# Patient Record
Sex: Male | Born: 1960 | Race: Black or African American | Hispanic: No | Smoking: Former smoker
Health system: Southern US, Community
[De-identification: ages and names within clinical notes are randomized; demographics above are authoritative.]

## PROBLEM LIST (undated history)

## (undated) DIAGNOSIS — Z8711 Personal history of peptic ulcer disease: Secondary | ICD-10-CM

## (undated) DIAGNOSIS — Z9289 Personal history of other medical treatment: Secondary | ICD-10-CM

## (undated) DIAGNOSIS — K621 Rectal polyp: Secondary | ICD-10-CM

## (undated) DIAGNOSIS — I2510 Atherosclerotic heart disease of native coronary artery without angina pectoris: Secondary | ICD-10-CM

## (undated) DIAGNOSIS — E785 Hyperlipidemia, unspecified: Secondary | ICD-10-CM

## (undated) DIAGNOSIS — I509 Heart failure, unspecified: Secondary | ICD-10-CM

## (undated) DIAGNOSIS — E119 Type 2 diabetes mellitus without complications: Secondary | ICD-10-CM

## (undated) DIAGNOSIS — K219 Gastro-esophageal reflux disease without esophagitis: Secondary | ICD-10-CM

## (undated) DIAGNOSIS — Z95810 Presence of automatic (implantable) cardiac defibrillator: Secondary | ICD-10-CM

## (undated) DIAGNOSIS — F419 Anxiety disorder, unspecified: Secondary | ICD-10-CM

## (undated) DIAGNOSIS — Z720 Tobacco use: Secondary | ICD-10-CM

## (undated) DIAGNOSIS — Z9989 Dependence on other enabling machines and devices: Secondary | ICD-10-CM

## (undated) DIAGNOSIS — F329 Major depressive disorder, single episode, unspecified: Secondary | ICD-10-CM

## (undated) DIAGNOSIS — G4733 Obstructive sleep apnea (adult) (pediatric): Secondary | ICD-10-CM

## (undated) DIAGNOSIS — J189 Pneumonia, unspecified organism: Secondary | ICD-10-CM

## (undated) DIAGNOSIS — K635 Polyp of colon: Secondary | ICD-10-CM

## (undated) HISTORY — DX: Tobacco use: Z72.0

## (undated) HISTORY — PX: CARDIAC CATHETERIZATION: SHX172

## (undated) HISTORY — DX: Rectal polyp: K62.1

## (undated) HISTORY — DX: Hyperlipidemia, unspecified: E78.5

## (undated) HISTORY — DX: Polyp of colon: K63.5

## (undated) HISTORY — DX: Personal history of other medical treatment: Z92.89

---
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
## 2000-11-04 DIAGNOSIS — K2270 Barrett's esophagus without dysplasia: Secondary | ICD-10-CM

## 2000-11-04 DIAGNOSIS — K635 Polyp of colon: Secondary | ICD-10-CM

## 2000-11-04 HISTORY — DX: Barrett's esophagus without dysplasia: K22.70

## 2000-11-04 HISTORY — DX: Polyp of colon: K63.5

## 2001-06-15 ENCOUNTER — Emergency Department (HOSPITAL_COMMUNITY): Admission: EM | Admit: 2001-06-15 | Discharge: 2001-06-15 | Payer: Self-pay | Admitting: Emergency Medicine

## 2001-06-15 ENCOUNTER — Encounter: Payer: Self-pay | Admitting: Emergency Medicine

## 2001-06-19 ENCOUNTER — Emergency Department (HOSPITAL_COMMUNITY): Admission: EM | Admit: 2001-06-19 | Discharge: 2001-06-19 | Payer: Self-pay | Admitting: Emergency Medicine

## 2001-09-23 ENCOUNTER — Encounter (INDEPENDENT_AMBULATORY_CARE_PROVIDER_SITE_OTHER): Payer: Self-pay | Admitting: *Deleted

## 2001-09-23 ENCOUNTER — Ambulatory Visit (HOSPITAL_COMMUNITY): Admission: RE | Admit: 2001-09-23 | Discharge: 2001-09-23 | Payer: Self-pay | Admitting: *Deleted

## 2003-11-05 HISTORY — PX: PATELLAR TENDON REPAIR: SHX737

## 2003-11-22 ENCOUNTER — Encounter (INDEPENDENT_AMBULATORY_CARE_PROVIDER_SITE_OTHER): Payer: Self-pay | Admitting: *Deleted

## 2003-11-22 ENCOUNTER — Ambulatory Visit (HOSPITAL_COMMUNITY): Admission: RE | Admit: 2003-11-22 | Discharge: 2003-11-22 | Payer: Self-pay | Admitting: *Deleted

## 2005-07-25 ENCOUNTER — Ambulatory Visit: Payer: Self-pay | Admitting: Cardiology

## 2005-08-12 ENCOUNTER — Ambulatory Visit: Payer: Self-pay

## 2005-08-19 ENCOUNTER — Ambulatory Visit: Payer: Self-pay | Admitting: Cardiology

## 2005-08-20 ENCOUNTER — Encounter (INDEPENDENT_AMBULATORY_CARE_PROVIDER_SITE_OTHER): Payer: Self-pay | Admitting: *Deleted

## 2005-08-20 ENCOUNTER — Ambulatory Visit: Payer: Self-pay | Admitting: Internal Medicine

## 2005-08-20 ENCOUNTER — Inpatient Hospital Stay (HOSPITAL_BASED_OUTPATIENT_CLINIC_OR_DEPARTMENT_OTHER): Admission: RE | Admit: 2005-08-20 | Discharge: 2005-08-20 | Payer: Self-pay | Admitting: Internal Medicine

## 2005-08-20 ENCOUNTER — Inpatient Hospital Stay (HOSPITAL_COMMUNITY)
Admission: AD | Admit: 2005-08-20 | Discharge: 2005-08-25 | Payer: Self-pay | Admitting: Thoracic Surgery (Cardiothoracic Vascular Surgery)

## 2005-09-09 ENCOUNTER — Encounter: Admission: RE | Admit: 2005-09-09 | Discharge: 2005-09-09 | Payer: Self-pay | Admitting: Unknown Physician Specialty

## 2005-09-17 ENCOUNTER — Ambulatory Visit: Payer: Self-pay | Admitting: Internal Medicine

## 2005-10-01 ENCOUNTER — Ambulatory Visit: Payer: Self-pay | Admitting: Internal Medicine

## 2005-10-21 ENCOUNTER — Ambulatory Visit: Payer: Self-pay | Admitting: Cardiology

## 2005-11-04 HISTORY — PX: CORONARY ARTERY BYPASS GRAFT: SHX141

## 2005-11-07 ENCOUNTER — Ambulatory Visit: Payer: Self-pay | Admitting: Internal Medicine

## 2005-11-07 ENCOUNTER — Ambulatory Visit: Payer: Self-pay | Admitting: Cardiology

## 2005-12-12 ENCOUNTER — Ambulatory Visit: Payer: Self-pay | Admitting: Internal Medicine

## 2005-12-20 ENCOUNTER — Ambulatory Visit: Payer: Self-pay

## 2005-12-20 ENCOUNTER — Encounter: Payer: Self-pay | Admitting: Internal Medicine

## 2005-12-26 ENCOUNTER — Encounter (HOSPITAL_COMMUNITY): Admission: RE | Admit: 2005-12-26 | Discharge: 2006-03-26 | Payer: Self-pay | Admitting: Cardiology

## 2005-12-30 ENCOUNTER — Ambulatory Visit: Payer: Self-pay | Admitting: Cardiology

## 2006-01-07 ENCOUNTER — Ambulatory Visit: Payer: Self-pay | Admitting: Cardiology

## 2006-01-27 ENCOUNTER — Ambulatory Visit: Payer: Self-pay | Admitting: Internal Medicine

## 2006-02-04 ENCOUNTER — Ambulatory Visit (HOSPITAL_COMMUNITY): Admission: RE | Admit: 2006-02-04 | Discharge: 2006-02-04 | Payer: Self-pay | Admitting: Internal Medicine

## 2006-02-04 ENCOUNTER — Ambulatory Visit: Payer: Self-pay | Admitting: Internal Medicine

## 2006-02-20 ENCOUNTER — Ambulatory Visit: Payer: Self-pay | Admitting: Cardiology

## 2006-02-21 ENCOUNTER — Ambulatory Visit: Payer: Self-pay

## 2006-03-25 ENCOUNTER — Ambulatory Visit: Payer: Self-pay | Admitting: Cardiology

## 2006-05-12 ENCOUNTER — Ambulatory Visit: Payer: Self-pay | Admitting: Cardiology

## 2006-07-08 ENCOUNTER — Ambulatory Visit: Payer: Self-pay | Admitting: Internal Medicine

## 2006-07-22 ENCOUNTER — Ambulatory Visit: Payer: Self-pay | Admitting: Internal Medicine

## 2006-08-19 ENCOUNTER — Ambulatory Visit: Payer: Self-pay

## 2006-08-19 ENCOUNTER — Encounter: Payer: Self-pay | Admitting: Cardiology

## 2006-09-09 ENCOUNTER — Ambulatory Visit: Payer: Self-pay | Admitting: Internal Medicine

## 2006-10-09 ENCOUNTER — Ambulatory Visit: Payer: Self-pay | Admitting: Cardiovascular Disease

## 2006-11-07 ENCOUNTER — Ambulatory Visit: Payer: Self-pay

## 2006-11-07 ENCOUNTER — Ambulatory Visit: Payer: Self-pay | Admitting: Cardiology

## 2006-11-10 ENCOUNTER — Ambulatory Visit: Payer: Self-pay | Admitting: Cardiology

## 2006-11-19 ENCOUNTER — Ambulatory Visit: Payer: Self-pay | Admitting: Cardiology

## 2006-11-19 LAB — CONVERTED CEMR LAB
BUN: 19 mg/dL (ref 6–23)
CO2: 30 meq/L (ref 19–32)
Calcium: 9.6 mg/dL (ref 8.4–10.5)
Chloride: 98 meq/L (ref 96–112)
Creatinine, Ser: 1.6 mg/dL — ABNORMAL HIGH (ref 0.4–1.5)
GFR calc Af Amer: 61 mL/min
GFR calc non Af Amer: 50 mL/min
Glucose, Bld: 128 mg/dL — ABNORMAL HIGH (ref 70–99)
HCT: 42.7 % (ref 39.0–52.0)
Hemoglobin: 13.4 g/dL (ref 13.0–17.0)
INR: 1 (ref 0.9–2.0)
MCHC: 31.4 g/dL (ref 30.0–36.0)
MCV: 85.1 fL (ref 78.0–100.0)
Platelets: 182 10*3/uL (ref 150–400)
Potassium: 3.6 meq/L (ref 3.5–5.1)
Prothrombin Time: 12.6 s (ref 10.0–14.0)
RBC: 5.01 M/uL (ref 4.22–5.81)
RDW: 13.8 % (ref 11.5–14.6)
Sodium: 137 meq/L (ref 135–145)
WBC: 5.3 10*3/uL (ref 4.5–10.5)
aPTT: 34.3 s (ref 26.5–36.5)

## 2006-11-21 ENCOUNTER — Ambulatory Visit: Payer: Self-pay | Admitting: Cardiology

## 2006-11-21 ENCOUNTER — Inpatient Hospital Stay (HOSPITAL_BASED_OUTPATIENT_CLINIC_OR_DEPARTMENT_OTHER): Admission: RE | Admit: 2006-11-21 | Discharge: 2006-11-21 | Payer: Self-pay | Admitting: Cardiology

## 2006-12-02 ENCOUNTER — Ambulatory Visit: Payer: Self-pay | Admitting: Cardiology

## 2007-01-20 ENCOUNTER — Ambulatory Visit: Payer: Self-pay | Admitting: Cardiology

## 2007-02-16 ENCOUNTER — Emergency Department (HOSPITAL_COMMUNITY): Admission: EM | Admit: 2007-02-16 | Discharge: 2007-02-16 | Payer: Self-pay | Admitting: Family Medicine

## 2007-07-27 ENCOUNTER — Ambulatory Visit: Payer: Self-pay | Admitting: Cardiology

## 2007-07-27 LAB — CONVERTED CEMR LAB
BUN: 13 mg/dL (ref 6–23)
CO2: 30 meq/L (ref 19–32)
Calcium: 10 mg/dL (ref 8.4–10.5)
Chloride: 100 meq/L (ref 96–112)
Creatinine, Ser: 1.5 mg/dL (ref 0.4–1.5)
GFR calc Af Amer: 65 mL/min
GFR calc non Af Amer: 54 mL/min
Glucose, Bld: 99 mg/dL (ref 70–99)
Potassium: 3.6 meq/L (ref 3.5–5.1)
Sodium: 140 meq/L (ref 135–145)

## 2008-01-07 ENCOUNTER — Emergency Department (HOSPITAL_COMMUNITY): Admission: EM | Admit: 2008-01-07 | Discharge: 2008-01-08 | Payer: Self-pay | Admitting: Emergency Medicine

## 2008-04-10 ENCOUNTER — Ambulatory Visit: Payer: Self-pay | Admitting: Internal Medicine

## 2008-04-10 ENCOUNTER — Observation Stay (HOSPITAL_COMMUNITY): Admission: EM | Admit: 2008-04-10 | Discharge: 2008-04-11 | Payer: Self-pay | Admitting: Emergency Medicine

## 2008-04-28 ENCOUNTER — Ambulatory Visit: Payer: Self-pay | Admitting: Cardiology

## 2008-10-06 ENCOUNTER — Ambulatory Visit: Payer: Self-pay | Admitting: Cardiology

## 2008-12-28 ENCOUNTER — Ambulatory Visit (HOSPITAL_BASED_OUTPATIENT_CLINIC_OR_DEPARTMENT_OTHER): Admission: RE | Admit: 2008-12-28 | Discharge: 2008-12-28 | Payer: Self-pay | Admitting: Cardiology

## 2008-12-31 ENCOUNTER — Ambulatory Visit: Payer: Self-pay | Admitting: Internal Medicine

## 2009-01-10 ENCOUNTER — Ambulatory Visit: Payer: Self-pay | Admitting: Pulmonary Disease

## 2009-04-06 DIAGNOSIS — E785 Hyperlipidemia, unspecified: Secondary | ICD-10-CM | POA: Insufficient documentation

## 2009-04-06 DIAGNOSIS — I428 Other cardiomyopathies: Secondary | ICD-10-CM

## 2009-04-06 DIAGNOSIS — I420 Dilated cardiomyopathy: Secondary | ICD-10-CM | POA: Insufficient documentation

## 2009-04-06 DIAGNOSIS — F17200 Nicotine dependence, unspecified, uncomplicated: Secondary | ICD-10-CM | POA: Insufficient documentation

## 2009-04-07 ENCOUNTER — Ambulatory Visit: Payer: Self-pay | Admitting: Cardiology

## 2009-04-20 ENCOUNTER — Encounter: Payer: Self-pay | Admitting: Cardiology

## 2009-04-20 ENCOUNTER — Ambulatory Visit: Payer: Self-pay

## 2009-05-09 ENCOUNTER — Emergency Department (HOSPITAL_COMMUNITY): Admission: EM | Admit: 2009-05-09 | Discharge: 2009-05-09 | Payer: Self-pay | Admitting: Emergency Medicine

## 2009-12-19 ENCOUNTER — Telehealth: Payer: Self-pay | Admitting: Cardiology

## 2009-12-20 ENCOUNTER — Emergency Department (HOSPITAL_COMMUNITY): Admission: EM | Admit: 2009-12-20 | Discharge: 2009-12-20 | Payer: Self-pay | Admitting: Emergency Medicine

## 2009-12-20 ENCOUNTER — Encounter (INDEPENDENT_AMBULATORY_CARE_PROVIDER_SITE_OTHER): Payer: Self-pay | Admitting: *Deleted

## 2009-12-25 ENCOUNTER — Telehealth: Payer: Self-pay | Admitting: Cardiology

## 2010-02-08 ENCOUNTER — Encounter (INDEPENDENT_AMBULATORY_CARE_PROVIDER_SITE_OTHER): Payer: Self-pay | Admitting: *Deleted

## 2010-02-08 ENCOUNTER — Ambulatory Visit: Payer: Self-pay | Admitting: Gastroenterology

## 2010-02-08 DIAGNOSIS — K625 Hemorrhage of anus and rectum: Secondary | ICD-10-CM | POA: Insufficient documentation

## 2010-02-08 DIAGNOSIS — K219 Gastro-esophageal reflux disease without esophagitis: Secondary | ICD-10-CM | POA: Insufficient documentation

## 2010-02-08 DIAGNOSIS — K649 Unspecified hemorrhoids: Secondary | ICD-10-CM | POA: Insufficient documentation

## 2010-02-08 LAB — CONVERTED CEMR LAB
ALT: 33 units/L (ref 0–53)
AST: 34 units/L (ref 0–37)
Albumin: 4.1 g/dL (ref 3.5–5.2)
Alkaline Phosphatase: 41 units/L (ref 39–117)
BUN: 14 mg/dL (ref 6–23)
Basophils Absolute: 0 10*3/uL (ref 0.0–0.1)
Basophils Relative: 0.3 % (ref 0.0–3.0)
Bilirubin, Direct: 0.1 mg/dL (ref 0.0–0.3)
CO2: 32 meq/L (ref 19–32)
Calcium: 9.4 mg/dL (ref 8.4–10.5)
Chloride: 102 meq/L (ref 96–112)
Creatinine, Ser: 1.4 mg/dL (ref 0.4–1.5)
Eosinophils Absolute: 0.1 10*3/uL (ref 0.0–0.7)
Eosinophils Relative: 1.1 % (ref 0.0–5.0)
Ferritin: 41.7 ng/mL (ref 22.0–322.0)
Folate: 8.7 ng/mL
GFR calc non Af Amer: 69.83 mL/min (ref >60)
Glucose, Bld: 94 mg/dL (ref 70–99)
HCT: 43 % (ref 39.0–52.0)
Hemoglobin: 14.4 g/dL (ref 13.0–17.0)
Iron: 82 ug/dL (ref 42–165)
Lymphocytes Relative: 51.8 % — ABNORMAL HIGH (ref 12.0–46.0)
Lymphs Abs: 2.9 10*3/uL (ref 0.7–4.0)
MCHC: 33.5 g/dL (ref 30.0–36.0)
MCV: 87.7 fL (ref 78.0–100.0)
Monocytes Absolute: 0.5 10*3/uL (ref 0.1–1.0)
Monocytes Relative: 9.6 % (ref 3.0–12.0)
Neutro Abs: 2.1 10*3/uL (ref 1.4–7.7)
Neutrophils Relative %: 37.2 % — ABNORMAL LOW (ref 43.0–77.0)
Platelets: 154 10*3/uL (ref 150.0–400.0)
Potassium: 4.2 meq/L (ref 3.5–5.1)
RBC: 4.9 M/uL (ref 4.22–5.81)
RDW: 14.6 % (ref 11.5–14.6)
Saturation Ratios: 20.1 % (ref 20.0–50.0)
Sodium: 140 meq/L (ref 135–145)
TSH: 0.79 microintl units/mL (ref 0.35–5.50)
Total Bilirubin: 0.7 mg/dL (ref 0.3–1.2)
Total Protein: 7 g/dL (ref 6.0–8.3)
Transferrin: 291.2 mg/dL (ref 212.0–360.0)
Vitamin B-12: 605 pg/mL (ref 211–911)
WBC: 5.7 10*3/uL (ref 4.5–10.5)

## 2010-03-08 ENCOUNTER — Ambulatory Visit: Payer: Self-pay | Admitting: Gastroenterology

## 2010-03-08 ENCOUNTER — Ambulatory Visit (HOSPITAL_COMMUNITY): Admission: RE | Admit: 2010-03-08 | Discharge: 2010-03-08 | Payer: Self-pay | Admitting: Gastroenterology

## 2010-03-13 ENCOUNTER — Encounter: Payer: Self-pay | Admitting: Gastroenterology

## 2010-04-03 ENCOUNTER — Encounter: Payer: Self-pay | Admitting: Cardiology

## 2010-05-02 ENCOUNTER — Ambulatory Visit: Payer: Self-pay | Admitting: Cardiology

## 2010-05-02 DIAGNOSIS — E669 Obesity, unspecified: Secondary | ICD-10-CM | POA: Insufficient documentation

## 2010-05-17 ENCOUNTER — Ambulatory Visit: Payer: Self-pay | Admitting: Cardiology

## 2010-05-23 ENCOUNTER — Encounter (INDEPENDENT_AMBULATORY_CARE_PROVIDER_SITE_OTHER): Payer: Self-pay | Admitting: *Deleted

## 2010-05-23 LAB — CONVERTED CEMR LAB
ALT: 28 units/L (ref 0–53)
AST: 34 units/L (ref 0–37)
Albumin: 4.1 g/dL (ref 3.5–5.2)
Alkaline Phosphatase: 41 units/L (ref 39–117)
Basophils Absolute: 0 10*3/uL (ref 0.0–0.1)
Basophils Relative: 0.5 % (ref 0.0–3.0)
Bilirubin, Direct: 0.1 mg/dL (ref 0.0–0.3)
Cholesterol: 234 mg/dL — ABNORMAL HIGH (ref 0–200)
Direct LDL: 166.4 mg/dL
Eosinophils Absolute: 0.1 10*3/uL (ref 0.0–0.7)
Eosinophils Relative: 2 % (ref 0.0–5.0)
HCT: 43.8 % (ref 39.0–52.0)
HDL: 50 mg/dL (ref >39.00)
Hemoglobin: 14.8 g/dL (ref 13.0–17.0)
Lymphocytes Relative: 52 % — ABNORMAL HIGH (ref 12.0–46.0)
Lymphs Abs: 2.8 10*3/uL (ref 0.7–4.0)
MCHC: 33.7 g/dL (ref 30.0–36.0)
MCV: 87.1 fL (ref 78.0–100.0)
Monocytes Absolute: 0.5 10*3/uL (ref 0.1–1.0)
Monocytes Relative: 9.1 % (ref 3.0–12.0)
Neutro Abs: 1.9 10*3/uL (ref 1.4–7.7)
Neutrophils Relative %: 36.4 % — ABNORMAL LOW (ref 43.0–77.0)
Platelets: 142 10*3/uL — ABNORMAL LOW (ref 150.0–400.0)
RBC: 5.03 M/uL (ref 4.22–5.81)
RDW: 15.4 % — ABNORMAL HIGH (ref 11.5–14.6)
Total Bilirubin: 0.8 mg/dL (ref 0.3–1.2)
Total CHOL/HDL Ratio: 5
Total Protein: 7 g/dL (ref 6.0–8.3)
Triglycerides: 102 mg/dL (ref 0.0–149.0)
VLDL: 20.4 mg/dL (ref 0.0–40.0)
WBC: 5.3 10*3/uL (ref 4.5–10.5)

## 2010-10-09 ENCOUNTER — Telehealth: Payer: Self-pay | Admitting: Cardiology

## 2010-12-04 NOTE — Procedures (Signed)
Summary: EGD   EGD  Procedure date:  11/22/2003  Findings:      Location: [facility]   Patient Name: Bruce Robertson, Bruce Robertson MRN: [id number] Procedure Procedures: Panendoscopy (EGD) CPT: 43235.  Personnel: Endoscopist: Roosvelt Harps, MD.  Exam Location: Exam performed in Endoscopy Suite. Outpatient  Patient Consent: Procedure, Alternatives, Risks and Benefits discussed, consent obtained, from patient. Consent was obtained by the RN. Consent to be contacted was not given.  Indications Symptoms: Reflux symptoms  Surveillance of: Barrett's Esophagus.  History  Current Medications: Patient is not currently taking Coumadin.  Allergies: No known allergies.  Pre-Exam Physical: Cardio-pulmonary exam, HEENT exam, Abdominal exam, Extremity exam, Neurological exam, Mental status exam WNL.  Exam Exam Info: Maximum depth of insertion Stomach, intended Duodenum. Incomplete due to patient intolerance. Patient position: on left side. Vocal cords visualized. Gastric retroflexion performed. Images taken. ASA Classification: II. Tolerance: poor.  Sedation Meds: Patient assessed and found to be appropriate for moderate (conscious) sedation. Sedation was managed by the Endoscopist. Cetacaine Spray 2 sprays given aerosolized. Demerol 100 mg. given IV. Versed 10 mg. given IV.  Monitoring: BP and pulse monitoring done. Oximetry used. Supplemental O2 given  Fluoroscopy: Fluoroscopy was not used.  Findings - HIATAL HERNIA: ICD9: Hernia, Hiatal: 553.3.Comments: Images 1-3:  evidence of nonprogressive Barrett's.   Assessment Abnormal examination, see findings above.  Diagnoses: 553.3: Hernia, Hiatal.  530.2: Barrett's.   Comments: Patient was so combative that no biopsies could be done.  Although he needs rep[eat EGD in 2-3 yrs, THIS WILL NOT BE DONE WITHOUT GENERAL ANESTESIA OR PROPOFOL. Events  Unplanned Intervention: No unplanned interventions were required.    Unplanned Events: There were no complications. Plans Medication(s): Continue current medications.  Patient Education: Patient given standard instructions for: Barrett's. Hiatal Hernia.  Disposition: After procedure patient sent to recovery. After recovery patient sent home.  Scheduling: Follow-up prn. EGD, to Roosvelt Harps, MD, around Nov 21, 2004.    This report was created from the original endoscopy report, which was reviewed and signed by the above listed endoscopist.

## 2010-12-04 NOTE — Procedures (Signed)
Summary: Colonoscopy  Patient: Bruce Robertson Note: All result statuses are Final unless otherwise noted.  Tests: (1) Colonoscopy (COL)   COL Colonoscopy           DONE     [facility]     [address] [place], [place]  [address]           COLONOSCOPY PROCEDURE REPORT           PATIENT:  Bruce Robertson, Bruce Robertson  MR#:  [id number]     BIRTHDATE:  Nov 11, 1960, 49 yrs. old  GENDER:  male     ENDOSCOPIST:  Rachael Fee, MD     REF. BY:  Vania Rea. Jarold Motto, M.D.     PROCEDURE DATE:  03/08/2010     PROCEDURE:  Diagnostic Colonoscopy     ASA CLASS:  Class III     INDICATIONS:  minor rectal bleeding; had colonoscopy 2002 by Dr.     Luther Parody, single HP polyp removed     MEDICATIONS:   MAC sedation, administered by CRNA           DESCRIPTION OF PROCEDURE:   After the risks benefits and     alternatives of the procedure were thoroughly explained, informed     consent was obtained.  Digital rectal exam was performed and     revealed no rectal masses.   The  endoscope was introduced through     the anus and advanced to the cecum, which was identified by both     the appendix and ileocecal valve, without limitations.  The     quality of the prep was good, using MoviPrep.  The instrument was     then slowly withdrawn as the colon was fully examined.     <<PROCEDUREIMAGES>>           FINDINGS:  Internal and external hemorrhoids were found. These     were small and not thrombosed.  other finding (see image1, image2,     and image3).   Retroflexed views in the rectum revealed no     abnormalities.    The scope was then withdrawn from the patient     and the procedure completed.           COMPLICATIONS:  None           ENDOSCOPIC IMPRESSION:     1) Small internal and external hemorrhoids     2) No polyps or cancers           RECOMMENDATIONS:     1) Continue current colorectal screening recommendations for     "routine risk" patients with a repeat colonoscopy in 10 years.    2) Follow up as needed with Dr. Jarold Motto.           REPEAT EXAM:  10 years           ______________________________     Rachael Fee, MD           n.     eSIGNED:   Rachael Fee at 03/08/2010 02:18 PM           Felicity Pellegrini, [id number]  Note: An exclamation mark (!) indicates a result that was not dispersed into the flowsheet. Document Creation Date: 03/08/2010 2:18 PM _______________________________________________________________________  (1) Order result status: Final Collection or observation date-time: 03/08/2010 14:04 Requested date-time:  Receipt date-time:  Reported date-time:  Referring Physician:   Ordering Physician: Rob Bunting [id number]) Specimen Source:  Source: Launa Grill Order Number: [id number] Lab site:

## 2010-12-04 NOTE — Procedures (Signed)
Summary: Colon   Colonoscopy  Procedure date:  09/23/2001  Findings:      Location:  [facility].   Patient Name: Bruce Robertson, Bruce Robertson MRN: [id number] Procedure Procedures: Colonoscopy CPT: 11914.    with Hot Biopsy(s)CPT: Z451292.  Personnel: Endoscopist: Roosvelt Harps, MD.  Referred By: Sherin Quarry, MD.  Exam Location: Exam performed in Endoscopy Suite. Outpatient  Patient Consent: Procedure, Alternatives, Risks and Benefits discussed, consent obtained, from patient. Consent to be contacted was not given.  Indications Symptoms: Hematochezia.  History Allergies: No known allergies.  Patient Habits Patient does not smoke. Drinking Status: not currently drinking.  Pre-Exam Physical: Cardio-pulmonary exam, Rectal exam, HEENT exam , Abdominal exam, Extremity exam, Neurological exam, Mental status exam WNL.  Exam Exam: Extent of exam reached: Cecum, extent intended: Cecum.  The cecum was identified by appendiceal orifice and IC valve. Patient position: on left side. Colon retroflexion performed. Images taken. ASA Classification: I. Tolerance: excellent.  Monitoring: Pulse and BP monitoring, Oximetry used. Supplemental O2 given.  Colon Prep Used Phospho Soda for colon prep. Prep results: excellent.  Fluoroscopy: Fluoroscopy was not used.  Sedation Meds: Sedation was managed by the Endoscopist. Demerol 30 mg. Versed 3 mg.  Findings , IMAGE TAKEN IMAGE TAKEN: Ascending Colon.  Image #2 attached.  Comments:  Normal.  IMAGE TAKEN: Cecum.  Image #1 attached.  Comments:  Normal.  POLYP: Sigmoid Colon, diminutive, sessile polyp. Distance from Anus 25 cm. Procedure:  hot biopsy, removed, retrieved, Polyp sent to pathology. ICD9: Colon Polyps: 211.3.  HEMORRHOIDS: Internal. Size: Grade II. Not bleeding. Not thrombosed. ICD9: Hemorrhoids, Internal: 455.0. Comments: Images 3 & 4.   Assessment Abnormal examination, see findings above.  Diagnoses: 455.0: Hemorrhoids,  Internal.  211.3: Colon Polyps.   Events  Unplanned Interventions: No intervention was required.  Unplanned Events: There were no complications. Plans  Post Exam Instructions: Post sedation instructions given.  Medication Plan: Continue current medications.  Patient Education: Patient given standard instructions for: Polyps.  Disposition: After procedure patient sent to recovery. After recovery patient sent home.  Scheduling/Referral: Follow-Up prn. Await pathology to schedule patient.    cc; Sherin Quarry, MD    This report was created from the original endoscopy report, which was reviewed and signed by the above listed endoscopist.

## 2010-12-04 NOTE — Progress Notes (Signed)
Summary: refill  Phone Note Refill Request Message from:  Patient on December 25, 2009 1:05 PM  Refills Requested: Medication #1:  COREG 12.5 MG TABS Take 1 tablet by mouth twice a day  Medication #2:  VIAGRA 100 MG TABS Take 1 tablet by mouth as directed Sentd to [facility] [address] [phone] pt have question about his Viagra  Initial call taken by: Judie Grieve,  December 25, 2009 1:07 PM  Follow-up for Phone Call        Spoke with pt, rx sent into pharmacy. Marrion Coy, CNA  December 25, 2009 2:00 PM  Follow-up by: Marrion Coy, CNA,  December 25, 2009 2:00 PM    New/Updated Medications: COREG 12.5 MG TABS (CARVEDILOL) Take 1 tablet by mouth twice a day FUROSEMIDE 40 MG TABS (FUROSEMIDE) Take 1 tablet by mouth once a day Prescriptions: FUROSEMIDE 40 MG TABS (FUROSEMIDE) Take 1 tablet by mouth once a day  #30 x 5   Entered by:   Marrion Coy, CNA   Authorized by:   Rollene Rotunda, MD, [facility]   Signed by:   Marrion Coy, CNA on 12/25/2009   Method used:   Electronically to        [facility] (retail)       [address]       [place], [place]  [address]       Ph: [phone]       Fax: [phone]   RxID:   [id number] COREG 12.5 MG TABS (CARVEDILOL) Take 1 tablet by mouth twice a day  #60 x 5   Entered by:   Marrion Coy, CNA   Authorized by:   Rollene Rotunda, MD, [facility]   Signed by:   Marrion Coy, CNA on 12/25/2009   Method used:   Electronically to        [facility] (retail)       [address]       [place], [place]  [address]       Ph: [phone]       Fax: [phone]   RxID:   [id number]

## 2010-12-04 NOTE — Progress Notes (Signed)
Summary: CHEST PAINS AND BLOOD IN STOOL  Phone Note Call from Patient Call back at Home Phone [phone]   Caller: Patient Action Taken: Appt Scheduled Today Summary of Call: PT CALLING WITH CHEST PAINS BLOOD IN STOOL  Initial call taken by: Judie Grieve,  December 19, 2009 11:08 AM  Follow-up for Phone Call        pt calling about having  alot of blood in stool, bright red.  he states it just drips and that he could probably fill up a cup.  C/O fatigue, SOB with any activity and occiasonal chest pain.  Instructed pt to go to the ER for eval.  Follow-up by: Charolotte Capuchin, RN,  December 19, 2009 11:17 AM

## 2010-12-04 NOTE — Letter (Signed)
Summary: Lipid/Liver Garment/textile technologist, Main Office  [address]   [place], [place] [address]   Phone: [phone]  Fax: [phone]     May 23, 2010 MRN: [id number]   Bruce Robertson [address] [place], [place]  [address]   Dear Mr. DUNNIGAN,  Dr.  Antoine Poche requests you have fasting lab work to check your lipid and liver (DX: 272.2, V58.69).  Please have your blood drawn fasting the week of July 23, 2010.  It is important that patients and their doctor work together in the management/treatment of their health care.  If you have already had your blood work drawn, please disregard this letter.  If you have not had your blood work drawn, please call [phone] or the number listed above to schedule an appointment.    Please bring this letter with you for your blood work.  Also, please remember not to eat or drink anything after midnight the night before.    Thank you,     Sander Nephew, RN for Dr Rollene Rotunda [facility]

## 2010-12-04 NOTE — Miscellaneous (Signed)
  Clinical Lists Changes  Observations: Added new observation of ECHOINTERP:          1. Left ventricle: LVEF is approximately 40% with inferior and        anterior hypokinesis. The cavity size was severely dilated. Wall        thickness was normal.     2. Aortic valve: Trivial regurgitation. (04/20/2009 11:24)      Echocardiogram  Procedure date:  04/20/2009  Findings:               1. Left ventricle: LVEF is approximately 40% with inferior and        anterior hypokinesis. The cavity size was severely dilated. Wall        thickness was normal.     2. Aortic valve: Trivial regurgitation.

## 2010-12-04 NOTE — Letter (Signed)
Summary: New Patient letter  [facility]  [address] [place], [place] [address]   Phone: [phone]  Fax: [phone]       12/20/2009 MRN: [id number]  Bruce Robertson [address] [place], [place]  [address]  Dear Bruce Robertson,  Welcome to the Gastroenterology Division at [facility].    You are scheduled to see Dr.  Claudette Head on January 17, 2010 at 1:45pm on the 3rd floor at [facility], [address]  We ask that you try to arrive at our office 15 minutes prior to your appointment time to allow for check-in.  We would like you to complete the enclosed self-administered evaluation form prior to your visit and bring it with you on the day of your appointment.  We will review it with you.  Also, please bring a complete list of all your medications or, if you prefer, bring the medication bottles and we will list them.  Please bring your insurance card so that we may make a copy of it.  If your insurance requires a referral to see a specialist, please bring your referral form from your primary care physician.  Co-payments are due at the time of your visit and may be paid by cash, check or credit card.     Your office visit will consist of a consult with your physician (includes a physical exam), any laboratory testing he/she may order, scheduling of any necessary diagnostic testing (e.g. x-ray, ultrasound, CT-scan), and scheduling of a procedure (e.g. Endoscopy, Colonoscopy) if required.  Please allow enough time on your schedule to allow for any/all of these possibilities.    If you cannot keep your appointment, please call [phone] to cancel or reschedule prior to your appointment date.  This allows [place] the opportunity to schedule an appointment for another patient in need of care.  If you do not cancel or reschedule by 5 p.m. the business day prior to your appointment date, you will be charged a $50.00 late cancellation/no-show fee.    Thank you  for choosing [facility] for your medical needs.  We appreciate the opportunity to care for you.  Please visit [place] at our website  to learn more about our practice.                     Sincerely,                                                             The Gastroenterology Division

## 2010-12-04 NOTE — Letter (Signed)
Summary: [facility] Instructions  [facility]  [address] [place], [place] [address]   Phone: [phone]  Fax: [phone]       Bruce Robertson    02-Apr-1961    MRN: [id number]        Procedure Day /Date: Thursday, 03/08/10       Arrival Time: 11:30      Procedure Time: 1:30     Location of Procedure:                     Bruce Robertson  [facility] ( Outpatient Registration)                        PREPARATION FOR COLONOSCOPY WITH MOVIPREP   Starting 5 days prior to your procedure  03/03/10 do not eat nuts, seeds, popcorn, corn, beans, peas,  salads, or any raw vegetables.  Do not take any fiber supplements (e.g. Metamucil, Citrucel, and Benefiber).  THE DAY BEFORE YOUR PROCEDURE         DATE: 03/07/10   DAY: Wednesday  1.  Drink clear liquids the entire day-NO SOLID FOOD  2.  Do not drink anything colored red or purple.  Avoid juices with pulp.  No orange juice.  3.  Drink at least 64 oz. (8 glasses) of fluid/clear liquids during the day to prevent dehydration and help the prep work efficiently.  CLEAR LIQUIDS INCLUDE: Water Jello Ice Popsicles Tea (sugar ok, no milk/cream) Powdered fruit flavored drinks Coffee (sugar ok, no milk/cream) Gatorade Juice: apple, white grape, white cranberry  Lemonade Clear bullion, consomm, broth Carbonated beverages (any kind) Strained chicken noodle soup Hard Candy                             4.  In the morning, mix first dose of MoviPrep solution:    Empty 1 Pouch A and 1 Pouch B into the disposable container    Add lukewarm drinking water to the top line of the container. Mix to dissolve    Refrigerate (mixed solution should be used within 24 hrs)  5.  Begin drinking the prep at 5:00 p.m. The MoviPrep container is divided by 4 marks.   Every 15 minutes drink the solution down to the next mark (approximately 8 oz) until the full liter is complete.   6.  Follow completed prep with 16 oz of clear liquid of  your choice (Nothing red or purple).  Continue to drink clear liquids until bedtime.  7.  Before going to bed, mix second dose of MoviPrep solution:    Empty 1 Pouch A and 1 Pouch B into the disposable container    Add lukewarm drinking water to the top line of the container. Mix to dissolve    Refrigerate  THE DAY OF YOUR PROCEDURE      DATE: 03/08/10  DAY: Thursday  Beginning at 8:30 a.m. (5 hours before procedure):         1. Every 15 minutes, drink the solution down to the next mark (approx 8 oz) until the full liter is complete.   2. Do not eat or drink anything after midnight except for the prep solution.     MEDICATION INSTRUCTIONS  Unless otherwise instructed, you should take regular prescription medications with a small sip of water   as early as possible the morning of your procedure.  OTHER INSTRUCTIONS  You will need a responsible adult at least 48 years of age to accompany you and drive you home.   This person must remain in the waiting room during your procedure.  Wear loose fitting clothing that is easily removed.  Leave jewelry and other valuables at home.  However, you may wish to bring a book to read or  an iPod/MP3 player to listen to music as you wait for your procedure to start.  Remove all body piercing jewelry and leave at home.  Total time from sign-in until discharge is approximately 2-3 hours.  You should go home directly after your procedure and rest.  You can resume normal activities the  day after your procedure.  The day of your procedure you should not:   Drive   Make legal decisions   Operate machinery   Drink alcohol   Return to work  You will receive specific instructions about eating, activities and medications before you leave.    The above instructions have been reviewed and explained to me by   _______________________    I fully understand and can verbalize these instructions  _____________________________ Date _________

## 2010-12-04 NOTE — Assessment & Plan Note (Signed)
Summary: per check out/23mths/saf  Medications Added COREG 12.5 MG TABS (CARVEDILOL) Take 1 tablet by mouth twice a day FUROSEMIDE 40 MG TABS (FUROSEMIDE) Take 1 tablet by mouth once a day LIPITOR 20 MG TABS (ATORVASTATIN CALCIUM) Take 1 tablet by mouth LISINOPRIL 20 MG TABS (LISINOPRIL) Take 1 tablet by mouth once a day POTASSIUM CHLORIDE CRYS CR 20 MEQ CR-TABS (POTASSIUM CHLORIDE CRYS CR) Take 1 tablet by mouth once a day VIAGRA 100 MG TABS (SILDENAFIL CITRATE) Take 1 tablet by mouth as directed MULTIVITAMINS  TABS (MULTIPLE VITAMIN) Take 1 by mouth once daily FISH OIL 1000 MG CAPS (OMEGA-3 FATTY ACIDS) Take 2 by mouth once daily      Allergies Added: NKDA  Visit Type:  Follow-up Primary Provider:  None  CC:  Cardiomyopathy.  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 Since I last saw him he has done well. He denies any chest discomfort, neck or arm discomfort. He has had no palpitations, presyncope or syncope. He denies any PND or orthopnea. He exercises routinely. He takes his medications as prescribed.  Current Medications (verified): 1)  Coreg 12.5 Mg Tabs (Carvedilol) .... Take 1 Tablet By Mouth Twice A Day 2)  Furosemide 40 Mg Tabs (Furosemide) .... Take 1 Tablet By Mouth Once A Day 3)  Lipitor 20 Mg Tabs (Atorvastatin Calcium) .... Take 1 Tablet By Mouth 4)  Lisinopril 20 Mg Tabs (Lisinopril) .... Take 1 Tablet By Mouth Once A Day 5)  Potassium Chloride Crys Cr 20 Meq Cr-Tabs (Potassium Chloride Crys Cr) .... Take 1 Tablet By Mouth Once A Day 6)  Viagra 100 Mg Tabs (Sildenafil Citrate) .... Take 1 Tablet By Mouth As Directed 7)  Multivitamins  Tabs (Multiple Vitamin) .... Take 1 By Mouth Once Daily 8)  Fish Oil 1000 Mg Caps (Omega-3 Fatty Acids) .... Take 2 By Mouth Once Daily  Allergies (verified): No Known Drug Allergies  Past History:  Past Medical History: Reviewed history from 04/06/2009 and no changes required.  Cardiomyopathy (nonischemic.  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  He underwent a saphenous vein graft to the LAD and a   saphenous vein graft to obtuse marginal), dyslipidemia, remote tobacco   abuse, reflux.      Past Surgical History: Reviewed history from 04/06/2009 and no changes required.  Coronary disease with prior bypass surgery  Review of Systems       As stated in the HPI and negative for all other systems.   Vital Signs:  Patient profile:   50 year old male Height:      73 inches Weight:      250 pounds BMI:     33.10 Pulse rate:   50 / minute BP sitting:   99 / 67  (left arm) Cuff size:   large  Vitals Entered By: Stanton Kidney, EMT-P (April 07, 2009 11:18 AM)  Nutrition Counseling: Patient's BMI is greater than 25 and therefore counseled on weight management options.  Physical Exam  General:  Well developed, well nourished, in no acute distress. Head:  normocephalic and atraumatic Eyes:  PERRLA/EOM intact; conjunctiva and lids normal. Mouth:  Teeth, gums and palate normal. Oral mucosa normal. Neck:  Neck supple, no JVD. No masses, thyromegaly or abnormal cervical nodes. Chest Wall:  Well healed sternotomy scar Lungs:  Clear bilaterally to auscultation and percussion. Heart:  Non-displaced PMI, chest non-tender; regular  rate and rhythm, S1, S2 without murmurs, rubs or gallops. Carotid upstroke normal, no bruit. Normal abdominal aortic size, no bruits. Femorals normal pulses, no bruits. Pedals normal pulses. No edema, no varicosities. Abdomen:  Bowel sounds positive; abdomen soft and non-tender without masses, organomegaly, or hernias noted. No hepatosplenomegaly. Msk:  Back normal, normal gait. Muscle strength and tone normal. Extremities:  No clubbing or cyanosis. Neurologic:  Alert and oriented x 3. Cervical Nodes:  no significant adenopathy Axillary Nodes:  no significant adenopathy Inguinal  Nodes:  no significant adenopathy Psych:  Normal affect.   EKG  Procedure date:  04/07/2009  Findings:      Sinus bradycardia, rate 53, axis within normal limits, nonspecific inferior and lateral T-wave inversions, intervals within normal limits.  Impression & Recommendations:  Problem # 1:  CARDIOMYOPATHY (ICD-425.4)  It has been 3 years since his last echocardiogram when his ejection fraction was about 40%. I will check another one to make sure that this is stable or improved. He will remain on the medications as listed.  Orders: EKG w/ Interpretation (93000) Echocardiogram (Echo)  Problem # 2:  DYSLIPIDEMIA (ICD-272.4) he remains on the Lipitor.hwill come back for fasting lipid profile.  Other Orders: Treadmill (Treadmill)  Patient Instructions: 1)  Your physician recommends that you schedule a follow-up appointment in: 12 MONTHS 2)  Your physician recommends that you continue on your current medications as directed. Please refer to the Current Medication list given to you today. 3)  Your physician has requested that you have an echocardiogram.  Echocardiography is a painless test that uses sound waves to create images of your heart. It provides your doctor with information about the size and shape of your heart and how well your heart's chambers and valves are working.  This procedure takes approximately one hour. There are no restrictions for this procedure.

## 2010-12-04 NOTE — Assessment & Plan Note (Signed)
Summary: intermitten rectal bleeding x 1 month...em   History of Present Illness Visit Type: Initial Visit Primary GI MD: Sheryn Bison MD FACP FAGA Primary Provider: n/a Requesting Provider: n/a Chief Complaint: rectal bleeding with BM's for 2 weeks then will stop for 1 month. 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 He is followed closely by cardiology and denies current cardiovascular symptoms. He is not on anticoagulation therapy.  His chronic worsening GERD despite taking over-the-counter antacids and H2 blockers. He had endoscopy many years ago and did not have Barrett's mucosa. Most of his symptoms are nocturnal in nature, and there is no associated dysphagia. He has regular bowel movements but has almost daily bright red blood per rectum without rectal pain or abdominal pain. He had colonoscopy by [facility] 2002. He did have hemorrhoids and some small polyps removed at that time. His neck had followup colonoscopy or barium studies. His appetite is good and his weight is stable. He denies shortness of breath, chest pain with exertion, or any history of gallbladder or liver disease.   GI Review of Systems    Reports acid reflux, belching, chest pain, heartburn, and  loss of appetite.      Denies abdominal pain, bloating, dysphagia with liquids, dysphagia with solids, nausea, vomiting, vomiting blood, weight loss, and  weight gain.      Reports change in bowel habits, constipation, and  diarrhea.     Denies anal fissure, black tarry stools, diverticulosis, fecal incontinence, heme positive stool, hemorrhoids, irritable bowel syndrome, jaundice, light color stool, liver problems, and  rectal pain. Preventive Screening-Counseling & Management      Drug Use:  no.      Current Medications (verified): 1)  Coreg  12.5 Mg Tabs (Carvedilol) .... Take 1 Tablet By Mouth Twice A Day 2)  Furosemide 40 Mg Tabs (Furosemide) .... Take 1 Tablet By Mouth Once A Day 3)  Lipitor 20 Mg Tabs (Atorvastatin Calcium) .... Take 1 Tablet By Mouth 4)  Lisinopril 20 Mg Tabs (Lisinopril) .... Take 1 Tablet By Mouth Once A Day 5)  Potassium Chloride Crys Cr 20 Meq Cr-Tabs (Potassium Chloride Crys Cr) .... Take 1 Tablet By Mouth Once A Day 6)  Viagra 100 Mg Tabs (Sildenafil Citrate) .... Take 1 Tablet By Mouth As Directed 7)  Multivitamins  Tabs (Multiple Vitamin) .... Take 1 By Mouth Once Daily 8)  Fish Oil 1000 Mg Caps (Omega-3 Fatty Acids) .... Take 2 By Mouth Once Daily  Allergies (verified): No Known Drug Allergies  Past History:  Past medical, surgical, family and social histories (including risk factors) reviewed for relevance to current acute and chronic problems.  Past Medical History:  Cardiomyopathy (nonischemic.  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  He underwent a saphenous vein graft to the LAD and a   saphenous vein graft to obtuse marginal), dyslipidemia, remote tobacco   abuse, reflux.    ulcers in GI tract hx of colon polyps 2002 hyperplastic polyp Sleep Apnea  Past Surgical History: Reviewed history from 04/06/2009 and no changes required.  Coronary disease with prior bypass surgery  Family History: Reviewed history from 04/06/2009 and no changes required. uncle ? lung cancer Grandmother  ? cancer type  Social History: Reviewed history from 04/06/2009 and no changes required. The patient works  as a Consulting civil engineer here in town.   He has a history of tobacco use, but stopped smoking in 2006.  He denies   alcohol abuse.  He had a previous history of fairly heavy alcohol use in   the remote past.  single Alcohol Use - yes  2 per day Daily Caffeine Use  5 per day Illicit Drug Use - no Drug Use:  no  Review of Systems        The patient complains of fatigue, shortness of breath, and thirst - excessive.  The patient denies allergy/sinus, anemia, anxiety-new, arthritis/joint pain, back pain, breast changes/lumps, change in vision, confusion, cough, coughing up blood, depression-new, fainting, fever, headaches-new, hearing problems, heart murmur, heart rhythm changes, itching, muscle pains/cramps, night sweats, nosebleeds, skin rash, sleeping problems, sore throat, swelling of feet/legs, swollen lymph glands, urination - excessive, urination changes/pain, urine leakage, vision changes, and voice change.    Vital Signs:  Patient profile:   50 year old male Height:      73 inches Weight:      248 pounds BMI:     32.84 BSA:     2.36 Pulse rate:   56 / minute Pulse rhythm:   regular BP sitting:   80 / 60  (left arm)  Vitals Entered By: Merri Ray CMA Duncan Dull) (February 08, 2010 9:48 AM)  Physical Exam  General:  Well developed, well nourished, no acute distress.obese.   Head:  Normocephalic and atraumatic. Eyes:  PERRLA, no icterus. Neck:  Supple; no masses or thyromegaly. Lungs:  Clear throughout to auscultation. Heart:  Regular rate and rhythm; no murmurs, rubs,  or bruits. Abdomen:  Soft, nontender and nondistended. No masses, hepatosplenomegaly or hernias noted. Normal bowel sounds. Rectal:  Swollen right posterior rectal area with obvious nonthrombosed hemorrhoid. There are no rectal masses or tenderness with soft stool present which is +1 guaiac positive. Prostate:  .normal size prostate.   Msk:  Symmetrical with no gross deformities. Normal posture. Extremities:  No clubbing, cyanosis, edema or deformities noted. Neurologic:  Alert and  oriented x4;  grossly normal neurologically. Psych:  Alert and cooperative. Normal mood and affect.   Impression & Recommendations:  Problem # 1:  HEMORRHOIDS, EXTERNAL (ICD-455.3) Assessment Deteriorated b.i.d. Sitz Baths and local Analpram cream and advanced  kit.Followup colonoscopy scheduled with propofol sedation as per recommendation of previous gastroenterologist. There apparently was a problem with conscious sedation and adequate control of this patient during the endoscopic procedures. Screening labs have also been ordered. Orders: TLB-CBC Platelet - w/Differential (85025-CBCD) TLB-BMP (Basic Metabolic Panel-BMET) (80048-METABOL) TLB-Hepatic/Liver Function Pnl (80076-HEPATIC) TLB-TSH (Thyroid Stimulating Hormone) (84443-TSH) TLB-B12, Serum-Total ONLY ([address]-U04) TLB-Ferritin (82728-FER) TLB-Folic Acid (Folate) (82746-FOL) TLB-IBC Pnl (Iron/FE;Transferrin) (83550-IBC)  Problem # 2:  ESOPHAGEAL REFLUX (ICD-530.81) Assessment: Deteriorated Followup endoscopy also scheduled with propofol sedation. Reflex regime and have initiated Dexilant 60 mg 30 minutes before supper since most of his reflux symptoms are nocturnal. Orders: TLB-CBC Platelet - w/Differential (85025-CBCD) TLB-BMP (Basic Metabolic Panel-BMET) (80048-METABOL) TLB-Hepatic/Liver Function Pnl (80076-HEPATIC) TLB-TSH (Thyroid Stimulating Hormone) (84443-TSH) TLB-B12, Serum-Total ONLY (54098-J19) TLB-Ferritin (82728-FER) TLB-Folic Acid (Folate) (82746-FOL) TLB-IBC Pnl (Iron/FE;Transferrin) (83550-IBC)  Problem # 3:  FECAL OCCULT BLOOD (ICD-792.1) Assessment: New  Orders: TLB-CBC Platelet - w/Differential (85025-CBCD) TLB-BMP (Basic Metabolic Panel-BMET) (80048-METABOL) TLB-Hepatic/Liver Function Pnl (80076-HEPATIC) TLB-TSH (Thyroid Stimulating Hormone) (84443-TSH) TLB-B12, Serum-Total ONLY (14782-N56) TLB-Ferritin (82728-FER) TLB-Folic Acid (Folate) (82746-FOL) TLB-IBC Pnl (Iron/FE;Transferrin) (83550-IBC)  Problem # 4:  CARDIOMYOPATHY (ICD-425.4) Assessment: Improved Continue multiple cardiac medications per cardiology.  Patient  Instructions: 1)  Begin Dexilant daily,  2)  Begin analpram as needed. 3)  You will be scheduled for an endoscopy and colonoscopy. 4)   Please go to the basement for lab work. 5)  The medication list was reviewed and reconciled.  All changed / newly prescribed medications were explained.  A complete medication list was provided to the patient / caregiver. 6)  Copy sent to : Dr. Rollene Rotunda and Dr. Elgie Congo. 7)  Please continue current medications.  8)  Constipation and Hemorrhoids brochure given.  9)  Colonoscopy and Flexible Sigmoidoscopy brochure given.  10)  Conscious Sedation brochure given.  11)  Upper Endoscopy brochure given.  12)  Avoid foods high in acid content ( tomatoes, citrus juices, spicy foods) . Avoid eating within 3 to 4 hours of lying down or before exercising. Do not over eat; try smaller more frequent meals. Elevate head of bed four inches when sleeping.   Appended Document: intermitten rectal bleeding x 1 month...em    Clinical Lists Changes  Medications: Added new medication of MOVIPREP 100 GM  SOLR (PEG-KCL-NACL-NASULF-NA ASC-C) As per prep instructions. - Signed Added new medication of DEXILANT 60 MG CPDR (DEXLANSOPRAZOLE) 1 by mouth qd - Signed Added new medication of ANAMANTLE HC 3-2.5 %  KIT (LIDOCAINE-HYDROCORTISONE ACE) Apply two times a day - Signed Rx of MOVIPREP 100 GM  SOLR (PEG-KCL-NACL-NASULF-NA ASC-C) As per prep instructions.;  #1 x 0;  Signed;  Entered by: Ashok Cordia RN;  Authorized by: Mardella Layman MD [facility];  Method used: Electronically to [facility] [address] [place], [place], [place]  [address], Ph: [phone], Fax: [phone] Rx of DEXILANT 60 MG CPDR (DEXLANSOPRAZOLE) 1 by mouth qd;  #30 x 6;  Signed;  Entered by: Ashok Cordia RN;  Authorized by: Mardella Layman MD [facility];  Method used: Electronically to [facility] [address], [place], [place], [place]  [address], Ph: [phone], Fax: [phone] Rx of ANAMANTLE HC 3-2.5 %  KIT (LIDOCAINE-HYDROCORTISONE ACE) Apply two times a day;  #20 x 3;  Signed;  Entered by: Ashok Cordia RN;  Authorized by: Mardella Layman MD [facility];  Method used: Electronically to [facility] [address], [place], [place], [place]  [address], Ph: [phone], Fax: [phone] Orders: Added new Test order of [facility] (Col/End [address]) - Signed    Prescriptions: ANAMANTLE HC 3-2.5 %  KIT (LIDOCAINE-HYDROCORTISONE ACE) Apply two times a day  #20 x 3   Entered by:   Ashok Cordia RN   Authorized by:   Mardella Layman MD [facility]   Signed by:   Ashok Cordia RN on 02/08/2010   Method used:   Electronically to        [facility] (retail)       [address]       [place], [place]  [address]       Ph: [phone]       Fax: [phone]   RxID:   [id number] DEXILANT 60 MG CPDR (DEXLANSOPRAZOLE) 1 by mouth qd  #30 x 6   Entered by:   Ashok Cordia RN   Authorized by:   Mardella Layman MD [facility]   Signed by:   Ashok Cordia RN on 02/08/2010   Method used:   Electronically to        [facility] (retail)  [address]       [place], [place]  [address]       Ph: [phone]       Fax: [phone]   RxID:   [id number] MOVIPREP 100 GM  SOLR (PEG-KCL-NACL-NASULF-NA ASC-C) As per prep instructions.  #1 x 0   Entered by:   Ashok Cordia RN   Authorized by:   Mardella Layman MD [facility]   Signed by:   Ashok Cordia RN on 02/08/2010   Method used:   Electronically to        [facility] (retail)       [address]       [place], [place]  [address]       Ph: [phone]       Fax: [phone]   RxID:   [id number]    Appended Document: intermitten rectal bleeding x 1 month...em    Clinical Lists Changes  Orders: Added new Test order of [facility] (Col/End [place]) - Signed

## 2010-12-04 NOTE — Letter (Signed)
Summary: Results Letter  [facility]  [address] [place], [place] [address]   Phone: [phone]  Fax: [phone]        Mar 13, 2010 MRN: [id number]    Bruce Robertson [address] [place], [place]  [address]    Dear Mr. GENIS,   The biopsies taken during your recent upper endoscopy showed no sign of Barrett's changes in your esophagus.  You do not require repeat endoscopies at this point.       Sincerely,  Rachael Fee MD  This letter has been electronically signed by your physician.  Appended Document: Results Letter letter mailed

## 2010-12-04 NOTE — Progress Notes (Signed)
Summary: refill  Phone Note Refill Request Message from:  Bruce Robertson on October 09, 2010 3:27 PM  Refills Requested: Medication #1:  FUROSEMIDE 40 MG TABS Take 1 tablet by mouth once a day [facility] Dr pt out medication   Initial call taken by: Judie Grieve,  October 09, 2010 3:28 PM  Follow-up for Phone Call        RX sent into pharmacy. Pt notified. Marrion Coy, CNA  October 09, 2010 4:16 PM  Follow-up by: Marrion Coy, CNA,  October 09, 2010 4:16 PM    Prescriptions: FUROSEMIDE 40 MG TABS (FUROSEMIDE) Take 1 tablet by mouth once a day  #30 Each x 8   Entered by:   Marrion Coy, CNA   Authorized by:   Rollene Rotunda, MD, [facility]   Signed by:   Marrion Coy, CNA on 10/09/2010   Method used:   Electronically to        [facility] (retail)       [address]       [place], [place]  [address]       Ph: [phone]       Fax: [phone]   RxID:   [id number]

## 2010-12-04 NOTE — Procedures (Signed)
Summary: Upper Endoscopy  Patient: Bruce Robertson Note: All result statuses are Final unless otherwise noted.  Tests: (1) Upper Endoscopy (EGD)   EGD Upper Endoscopy       DONE     [facility]     [address] [place], [place]  [address]           ENDOSCOPY PROCEDURE REPORT           PATIENT:  Bruce Robertson, Bruce Robertson  MR#:  [id number]     BIRTHDATE:  05-18-1961, 49 yrs. old  GENDER:  male     ENDOSCOPIST:  Rachael Fee, MD     Referred by:  Vania Rea. Jarold Motto, M.D.     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     Santogage); EGD 2005, unable to get biopsies of Barrett's     appearing mucosa (Dr. Luther Parody) due to sedation issues     MEDICATIONS:  MAC sedation, administered by CRNA     TOPICAL ANESTHETIC:  none     DESCRIPTION OF PROCEDURE:   After the risks benefits and     alternatives of the procedure were thoroughly explained, informed     consent was obtained.  The  endoscope was introduced through the     mouth and advanced to the second portion of the duodenum, without     limitations.  The instrument was slowly withdrawn as the mucosa     was fully examined.     <<PROCEDUREIMAGES>>     There was a non-nodular, but irregular Z line. There was no     clearly metaplastic appearing mucosa but biopsies were taken give     his personal history of Barrett's (see image1). There was mild,     linear, erosive reflux type esophagitis.  A hiatal hernia was     found. This was 2-3cm (see image4).  Otherwise the examination was     normal (see image5, image2, and image3).  Retroflexed views     revealed no abnormalities.  The scope was then withdrawn from the     patient and the procedure completed.     COMPLICATIONS:  None           ENDOSCOPIC IMPRESSION:     1) Irregular Z-line, biopsied to check for Barrett's     2) Esophagitis, mild     3) 2-3cm hiatal  hernia     4) Otherwise normal examination           RECOMMENDATIONS:     Await biopsied to determine interval for surveillance endoscopy.           He is very clear that Dexilant has been helpful for his chronic     GERD symptoms, will continue on that.     Follow up with Dr. Jarold Motto as needed.           ______________________________     Rachael Fee, MD           n.     eSIGNED:   Rachael Fee at 03/08/2010 02:11 PM           Felicity Pellegrini, [id number]  Note: An exclamation mark (!) indicates a result that was not dispersed into the flowsheet. Document Creation Date: 03/08/2010 2:11 PM _______________________________________________________________________  (1)  Order result status: Final Collection or observation date-time: 03/08/2010 14:04 Requested date-time:  Receipt date-time:  Reported date-time:  Referring Physician:   Ordering Physician: Rob Bunting [id number]) Specimen Source:  Source: Launa Grill Order Number: [id number] Lab site:

## 2010-12-04 NOTE — Assessment & Plan Note (Signed)
Summary: 1 YR/SDMP   Visit Type:  Follow-up Referring Provider:  n/a Primary Provider:  n/a  CC:  Cardiomyopathy.  History of Present Illness: The patient present for evaluation of cardiomyopathy. Since I last saw him he has done quite well. He exercises routinely. He denies any cardiovascular symptoms such as palpitations, presyncope or syncope. He has no shortness of breath. He denies any chest pressure, neck or arm discomfort. He has no PND or orthopnea.  Current Medications (verified): 1)  Coreg 12.5 Mg Tabs (Carvedilol) .... Take 1 Tablet By Mouth Twice A Day 2)  Furosemide 40 Mg Tabs (Furosemide) .... Take 1 Tablet By Mouth Once A Day 3)  Lipitor 20 Mg Tabs (Atorvastatin Calcium) .... Take 1 Tablet By Mouth 4)  Lisinopril 20 Mg Tabs (Lisinopril) .... Take 1 Tablet By Mouth Once A Day 5)  Potassium Chloride Crys Cr 20 Meq Cr-Tabs (Potassium Chloride Crys Cr) .... Take 1 Tablet By Mouth Once A Day 6)  Viagra 100 Mg Tabs (Sildenafil Citrate) .... Take 1 Tablet By Mouth As Directed 7)  Multivitamins  Tabs (Multiple Vitamin) .... Take 1 By Mouth Once Daily 8)  Fish Oil 1000 Mg Caps (Omega-3 Fatty Acids) .... Take 2 By Mouth Once Daily 9)  Dexilant 60 Mg Cpdr (Dexlansoprazole) .Marland Kitchen.. 1 By Mouth Qd  Allergies (verified): No Known Drug Allergies  Past History:  Past Medical History:  Cardiomyopathy (nonischemic.  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  He underwent a saphenous vein graft to the LAD and a   saphenous vein graft to obtuse marginal), dyslipidemia, remote tobacco   abuse, reflux.  ulcers in GI tract, colon polyps 2002 hyperplastic polyp,  sleep apnea (mild)  Past Surgical History: CABG Right patellar tendon surgery  Review of Systems       As stated in the HPI and negative for all other systems.   Vital Signs:  Patient profile:   50 year old male Height:      73 inches Weight:      245 pounds BMI:      32.44 Pulse rate:   57 / minute Resp:     18 per minute BP sitting:   120 / 78  (right arm)  Vitals Entered By: Marrion Coy, CNA (May 02, 2010 2:23 PM)  Physical Exam  General:  Well developed, well nourished, no acute distress.obese.   Head:  Normocephalic and atraumatic. Eyes:  PERRLA, no icterus. Mouth:  Teeth, gums and palate normal. Oral mucosa normal. Neck:  Supple; no masses or thyromegaly. Chest Wall:  Well healed sternotomy scar Lungs:  Clear throughout to auscultation. Abdomen:  Soft, nontender and nondistended. No masses, hepatosplenomegaly or hernias noted. Normal bowel sounds. Msk:  Symmetrical with no gross deformities. Normal posture. Extremities:  No clubbing, cyanosis, edema or deformities noted. Neurologic:  Alert and  oriented x4;  grossly normal neurologically. Cervical Nodes:  no significant adenopathy Inguinal Nodes:  no significant adenopathy Psych:  Normal affect.   Detailed Cardiovascular Exam  Neck    Carotids: Carotids full and equal bilaterally without bruits.      Neck Veins: Normal, no JVD.    Heart    Inspection: no deformities or lifts noted.      Palpation: normal PMI with no thrills palpable.      Auscultation: regular rate and rhythm, S1, S2 without murmurs, rubs, gallops, or clicks.    Vascular  Abdominal Aorta: no palpable masses, pulsations, or audible bruits.      Femoral Pulses: normal femoral pulses bilaterally.      Pedal Pulses: normal pedal pulses bilaterally.      Radial Pulses: normal radial pulses bilaterally.      Peripheral Circulation: no clubbing, cyanosis, or edema noted with normal capillary refill.     EKG  Procedure date:  05/02/2010  Findings:      Sinus rhythm, rate 57, right axis deviation, low voltage in the limb leads, no acute ST-T wave changes  Impression & Recommendations:  Problem # 1:  CARDIOMYOPATHY (ICD-425.4) The patient has had a mildly reduced ejection fraction. He was last evaluated with  an echo last year and his EF was 40%. He has no symptoms. No further cardiovascular testing is suggested. He will continue with meds as listed.  Problem # 2:  OBESITY, UNSPECIFIED (ICD-278.00) He and I had a long discussion about weight. This gentleman is actually extremely fit and muscular and so his body mass index is not representative. I did however suggest more aerobic activity which he is gradually veering towards.  Patient Instructions: 1)  Your physician recommends that you return for a FASTING lipid profile: lipid liver and cbc   428.0 next few weeks 2)  Your physician wants you to follow-up in: 12 months  You will receive a reminder letter in the mail two months in advance. If you don't receive a letter, please call our office to schedule the follow-up appointment.

## 2010-12-04 NOTE — Procedures (Signed)
Summary: Prep/[facility]  Prep/ Gastroenterology   Imported By: Lester Richland 02/15/2010 11:51:30  _____________________________________________________________________  External Attachment:    Type:   Image     Comment:   External Document

## 2010-12-04 NOTE — Miscellaneous (Signed)
Summary: RX for Lipitor  Clinical Lists Changes  Medications: Changed medication from LIPITOR 20 MG TABS (ATORVASTATIN CALCIUM) Take 1 tablet by mouth to LIPITOR 40 MG TABS (ATORVASTATIN CALCIUM) one daily - Signed Rx of LIPITOR 40 MG TABS (ATORVASTATIN CALCIUM) one daily;  #30 x 11;  Signed;  Entered by: Charolotte Capuchin, RN;  Authorized by: Rollene Rotunda, MD, [facility];  Method used: Electronically to [facility] [address], [place], [place], [place]  [address], Ph: [phone], Fax: [phone]    Prescriptions: LIPITOR 40 MG TABS (ATORVASTATIN CALCIUM) one daily  #30 x 11   Entered by:   Charolotte Capuchin, RN   Authorized by:   Rollene Rotunda, MD, [facility]   Signed by:   Charolotte Capuchin, RN on 05/23/2010   Method used:   Electronically to        [facility] (retail)       [address]       [place], [place]  [address]       Ph: [phone]       Fax: [phone]   RxID:   [id number]

## 2011-01-23 LAB — POCT I-STAT, CHEM 8
BUN: 21 mg/dL (ref 6–23)
Calcium, Ion: 1.19 mmol/L (ref 1.12–1.32)
Chloride: 103 mEq/L (ref 96–112)
Creatinine, Ser: 1.5 mg/dL (ref 0.4–1.5)
Glucose, Bld: 92 mg/dL (ref 70–99)
HCT: 45 % (ref 39.0–52.0)
Hemoglobin: 15.3 g/dL (ref 13.0–17.0)
Potassium: 3.6 mEq/L (ref 3.5–5.1)
Sodium: 138 mEq/L (ref 135–145)
TCO2: 30 mmol/L (ref 0–100)

## 2011-01-23 LAB — TSH: TSH: 1.641 u[IU]/mL (ref 0.350–4.500)

## 2011-02-26 ENCOUNTER — Other Ambulatory Visit: Payer: Self-pay | Admitting: Cardiology

## 2011-03-19 NOTE — Assessment & Plan Note (Signed)
[facility]                            CARDIOLOGY OFFICE NOTE   NAME:Bruce Robertson, Bruce Robertson                     MRN:          [id number]  DATE:07/27/2007                            DOB:          Feb 12, 1961    PRIMARY CARE PHYSICIAN:  None.   REASON FOR PRESENTATION:  Evaluate patient with cardiomyopathy.   HISTORY OF PRESENT ILLNESS:  The patient is doing well since I last saw  him. He is working at the [employer] in [place]. He  denies any new symptoms. He occasionally has some fleeting chest  discomfort. His arms will go to sleep when he folds them across his  chest. He is doing light weights and aerobic exercise 2 or 3 times a  week. He has no chest discomfort with this. He has no shortness of  breath. He denies any PND or orthopnea.   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   ALLERGIES:  The patient was intolerant of ASPIRIN.   CURRENT MEDICATIONS:  1. Coreg 12.5 mg b.i.d.  2. Lisinopril 20 mg daily.  3. Potassium 20 mEq daily.  4. Lipitor 20 mg three times daily.  5. Lasix 80 mg q.a.m. and 40 mg q.p.m.   REVIEW OF SYSTEMS:  As stated in the HPI and otherwise negative for  other systems.   PHYSICAL EXAMINATION:  GENERAL:  The patient is in no distress.  VITAL SIGNS:  Blood pressure 104/70, heart rate 60 and regular, weight  269 pounds, body mass index 34.  HEENT:  Eyes unremarkable, pupils equal, round, and reactive to light,  fundi not visualized, oral mucosa unremarkable.  NECK:  No jugular venous distension, waveform within normal limits,  carotid upstroke brisk and symmetric, no bruits, no thyromegaly.  LYMPHATICS:  No lymphadenopathy.  LUNGS:  Clear to auscultation bilaterally.  BACK:  No costovertebral angle tenderness.  CHEST:  Well healed sternotomy scar.  HEART:  PMI not displaced or sustained, S1 and S2 within normal limits,  no S3, no S4, no clicks, rubs, or murmurs.  ABDOMEN:  Mildly obese, positive bowel sounds normal to frequency and  pitch, no bruits, no rebound, no guarding, no midline pulsatile mass, no  organomegaly.  SKIN:  No rashes or nodules.  EXTREMITIES:  2+ pulses. No edema.   EKG sinus rhythm, right axis deviation, rate 60, axis within normal  limits, intervals within normal limits, no acute STT wave changes.   ASSESSMENT AND PLAN:  1. Cardiomyopathy. The patient is doing well with respect to this. No      change to his medical regimen other than to try to reduce his Lasix      to 40 mg twice a day. We can down with this over time.  2. Status post CABG. He is doing well with respect to this. No further      cardiovascular testing is suggested.  3. Follow up. We will get a BMET today. I  will see him again in about      18 months or sooner if needed.     Rollene Rotunda, MD, [facility]  Electronically Signed    JH/MedQ  DD: 07/27/2007  DT: 07/27/2007  Job #: [id number]

## 2011-03-19 NOTE — Discharge Summary (Signed)
NAME:  Bruce Robertson, Bruce Robertson NO.:  [id number]   MEDICAL RECORD NO.:  [id number]          PATIENT TYPE:  OBV   LOCATION:  3705                         FACILITY:  [facility]   PHYSICIAN:  Rollene Rotunda, MD, FACCDATE OF BIRTH:  01/07/1961   DATE OF ADMISSION:  04/10/2008  DATE OF DISCHARGE:  04/11/2008                               DISCHARGE SUMMARY   PRIMARY CARDIOLOGIST:  Rollene Rotunda, MD, [facility].   PRIMARY CARE [name]:  [name]. [name], MD.   DISCHARGE DIAGNOSIS:  Chest pain.   SECONDARY DIAGNOSES:  1. Coronary artery disease status post cardiac catheterization      resulting in left main dissection with subsequent coronary artery      bypass graft x2 with the vein graft to the left anterior descending      and vein graft to the obtuse marginal.  2. Nonischemic cardiomyopathy, ejection fraction 45%.  3. Hyperlipidemia.  4. Gastroesophageal reflux disease.  5. Remote tobacco abuse.   ALLERGIES:  ASPIRIN.   PROCEDURES:  None.   HISTORY OF PRESENT ILLNESS:  This is a 50 year old African American male  with prior history of CABG x2, who was in his usual state of health  until about approximately 2-3 days prior to admission when he began to  experience intermittent chest discomfort associated with shortness of  breath, weakness, and fatigue.  The pain was described as sometimes  being sharp and sometimes being pressure like.  He presented to the  [facility] ED on April 10, 2008, where ECG showed no acute changes and  cardiac markers were negative.  The patient was placed in 23-hour  observation.   HOSPITAL COURSE:  The patient is ruled out for MI and has had no  additional chest discomfort.  At this time, we do not feel he will need  additional cardiac evaluation.  He is being discharged home today in  good condition.   DISCHARGE LABORATORIES:  Hemoglobin 14.0, hematocrit 41.3, WBC 5.6,  platelets 163, and MCV 85.7.  Sodium 139, potassium 3.9, chloride 113,  CO2 27,  BUN 17, creatinine 1.3, and glucose 144.  Total bilirubin 0.8,  alkaline phosphatase 48, AST 35, ALT 42, and albumin 3.9.  CK 659, MB  3.7, troponin-I 0.02, and calcium 9.3.  D-dimer is 0.25.   DISPOSITION:  The patient is being discharged home today in good  condition.   FOLLOW-UP APPOINTMENTS:  We have arranged followup with Dr. Antoine Poche on  April 28, 2008, at 1:30 p.m..  He is to follow up with Dr. [name] as  previously scheduled.   DISCHARGE MEDICATIONS:  We have made no changes to his home medication  list which include;  1. Coreg 12.5 mg b.i.d.  2. Lisinopril 20 mg daily.  3. Potassium 20 mEq daily.  4. Lipitor 20 mg daily.  5. Lasix 40 mg b.i.d.  6. Multivitamin daily.  7. Omega-3 fish oil daily.   OUTSTANDING LABORATORIES AND STUDIES:  None.   DURATION OF DISCHARGE ENCOUNTER:  40 minutes including physician time.      Nicolasa Ducking, ANP      Rollene Rotunda, MD, [facility]  Electronically Signed    CB/MEDQ  D:  04/11/2008  T:  04/11/2008  Job:  [id number]   cc:   Brett Canales A. [name], M.D.

## 2011-03-19 NOTE — Assessment & Plan Note (Signed)
[facility]                            CARDIOLOGY OFFICE NOTE   NAME:Bruce Robertson, Bruce Robertson                     MRN:          [id number]  DATE:04/28/2008                            DOB:          03/06/1961    PRIMARY CARE PHYSICIAN:  None.   REASON FOR PRESENTATION:  Evaluate the patient with recent  hospitalization for chest pain and shortness of breath.   HISTORY OF PRESENT ILLNESS:  The patient was admitted on April 10, 2008,  for an overnight stay for evaluation of chest discomfort.  It was an  atypical discomfort.  He had some weakness and fatigue with this.  It  was a shooting pain under his left breast.  He had no substernal chest  pressure, neck or arm discomfort.  His rhythm overnight was fine without  any arrhythmias.  He had normal EKGs.  His cardiac enzymes were  negative.  No further testing was warranted.   He says since discharge he has had still some of these fleeting  discomforts, but they have been very sporadic.  They are not associated  with activity.  He says occasionally he feel like he needs to take a  deep breath, but otherwise does not describe any profound dyspnea on  exertion and certainly no PND or orthopnea.   PAST MEDICAL HISTORY:  1. Cardiomyopathy, nonischemic (EF has been about 45%)  2. Status post CABG after left main dissection, underwent      catheterization (SVG to LAD and SVG to obtuse marginal)  3. Dyslipidemia.  4. Remote tobacco abuse.  5. Reflux.   ALLERGIES:  Patient has been intolerant of ASPIRIN.   CURRENT MEDICATIONS:  1. Coreg 12.5 mg b.i.d.  2. Lisinopril 20 mg daily.  3. Potassium 20 mEq daily.  4. Multivitamin.  5. Lipitor 20 mg daily.  6. Lasix 20 mg b.i.d.  7. Omega 3.   REVIEW OF SYSTEMS:  As stated in the HPI and otherwise negative for  other systems.   PHYSICAL EXAMINATION:  GENERAL:  The patient is in no distress.  VITAL SIGNS:  Blood pressure 96/58, heart rate 54 and regular.  HEENT:   Eyes unremarkable, pupils equal, round, and reactive to light,  and fundi not visualized.  NECK:  No jugular venous distention at 45 degrees, carotid upstroke  brisk and symmetrical.  No bruits, no thyromegaly.  LUNGS:  Clear to auscultation bilaterally.  Chest wall, healed  sternotomy scar.  HEART:  PMI not displaced or sustained, S1 and S2 within normal limits.  No S3, no S4, no clicks, no rubs, no murmur.  ABDOMEN:  Mildly obese, positive bowel sounds, normal in frequency and  pitch.  No bruits, no rebound, no guarding.  No midline pulsatile mass,  no organomegaly.  SKIN:  No rashes, no nodules.  EXTREMITIES:  2+ pulses, no edema.   STUDIES:  EKG, sinus bradycardiac, rate 50, right axis deviation, no  acute ST-T wave changes.   ASSESSMENT AND PLAN:  1. Chest, the patient's chest discomfort is atypical.  He had      catheterization for similar symptoms last year with  no obstructive      disease.  In fact the left main dissection seem to have healed.  At      this point, no further cardiovascular testing is suggested.  2. Cardiomyopathy.  The patient had nonischemic cardiomyopathy.  He      does have some mild dyspnea occasionally.  I am going to get an      echocardiogram to further quantify his ejection fraction.  He seems      to be tolerating the medications that he is on despite his low      blood pressure.  He will remain on these.  3. Followup.  We will see the patient again in 6 months or sooner if      needed.     Rollene Rotunda, MD, [facility]  Electronically Signed    JH/MedQ  DD: 04/28/2008  DT: 04/29/2008  Job #: [id number]

## 2011-03-19 NOTE — Procedures (Signed)
NAME:  Bruce Robertson, Bruce Robertson NO.:  [id number]   MEDICAL RECORD NO.:  [id number]          PATIENT TYPE:  OUT   LOCATION:  SLEEP CENTER                 FACILITY:  [facility]   PHYSICIAN:  Barbaraann Share, MD,FCCPDATE OF BIRTH:  1961/02/15   DATE OF STUDY:  12/28/2008                            NOCTURNAL POLYSOMNOGRAM   REFERRING PHYSICIAN:  Rollene Rotunda, MD, [facility]   INDICATION FOR STUDY:  Hypersomnia with sleep apnea.   EPWORTH SLEEPINESS SCORE:  Epworth score is 18.   SLEEP ARCHITECTURE:  The patient had a total sleep time of 353 minutes  with no slow wave sleep and decreased REM.  Sleep onset latency was  normal at 14 minutes, and REM onset was at the upper limits of normal.  Sleep efficiency was fairly good at 91%.   RESPIRATORY DATA:  The patient was found to have one obstructive apnea  and 18 obstructive hypopneas given him an apnea/hypopnea index of 3  events per hour.  He was also noted to have many more episodes of  airflow reduction, however, did not meet the formal criteria to be  scored as an obstructive hypopnea due to lack of oxygen desaturation.  There was moderate snoring noted throughout.   OXYGEN DATA:  The patient had O2 desaturation as low as 88% transiently  during the night.  He only had 36 seconds, the entire night less than  90%.   CARDIAC DATA:  Occasional PVC was seen.  There were no clinically  significant arrhythmias noted.   MOVEMENT/PARASOMNIA:  The patient had no significant leg jerks or  abnormal behaviors.   IMPRESSION/RECOMMENDATION:  1. Small numbers of obstructive events which do not meet the      apnea/hypopnea index criteria for the obstructive sleep apnea      syndrome.  He did have other episodes of airflow reduction that did      not meet the formal criteria/definition for obstructive hypopnea.      I would encourage the patient to work aggressively on weight      loss, and also try and avoid sleeping in the supine position.  2. Occasional premature ventricular contraction, but no clinically      significant arrhythmia seen.       Barbaraann Share, MD,FCCP  Diplomate, American Board of Sleep  Medicine  Electronically Signed     KMC/MEDQ  D:  01/10/2009 08:16:41  T:  01/10/2009 19:53:10  Job:  [id number]

## 2011-03-19 NOTE — H&P (Signed)
NAME:  Bruce Robertson, Bruce Robertson NO.:  [id number]   MEDICAL RECORD NO.:  [id number]          PATIENT TYPE:  OBV   LOCATION:  3705                         FACILITY:  [facility]   PHYSICIAN:  Doylene Canning. Ladona Ridgel, MD    DATE OF BIRTH:  24-Oct-1961   DATE OF ADMISSION:  04/10/2008  DATE OF DISCHARGE:                              HISTORY & PHYSICAL   ADMISSION DIAGNOSIS:  Chest pain.   HISTORY OF PRESENT ILLNESS:  The patient is a 50 year old man with prior  catheterization and dissection of the left main coronary artery  resulting in myocardial infarction and emergent bypass surgery back in  2006.  He is left with LV dysfunction and an EF of 45% by most recent  echo.  AT the time of his bypass, he underwent a saphenous vein graft to  LAD and saphenous vein graft to the obtuse marginal branch.  The patient  also has a history of dyslipidemia and reflux disease.  He has been  under increasing stress lately, but notes over the last 2-3 days having  increasing chest pressure associated with shortness of breath, fatigue,  and weakness..  Also, he notes that he had intermittent spells of sharp  chest pain different from the chest pressure not related to exertion,  lasting 10-15 seconds.  He denies syncope.  He denies palpitations.  He  is admitted for additional evaluation.  He denies peripheral edema.   His past medical history is notable for;  1. Prior myocardial infarction but with no obstructive coronary      disease with prior bypass surgery.  2. Dyslipidemia.  3. Gastroesophageal reflux disease.   SOCIAL HISTORY:  The patient works as a Consulting civil engineer here in town.  He has a history of tobacco use, but stopped smoking in 2006.  He denies  alcohol abuse.  He had a previous history of fairly heavy alcohol use in  the remote past.   FAMILY HISTORY:  Noncontributory.   REVIEW OF SYSTEMS:  As noted above.  He also notes fatigue and weakness.  He also notes some problems with  nausea.  Otherwise, all systems  reviewed and found to be negative except as noted above.   PHYSICAL EXAMINATION:  He is a pleasant, well-appearing young man in no  acute distress.  Blood pressure was 129/79, the pulse was 60 and  regular, respirations were 16, and temperature 98.  HEENT:  Normocephalic, atraumatic.  Pupils are equal and round.  Oropharynx moist.  Sclerae anicteric.  The neck revealed no jugular  distention.  There is no thyromegaly.  Trachea is midline.  Carotids are  2+ and symmetric.  LUNGS:  Clear bilaterally to auscultation.  No wheezes, rales, or  rhonchi are present.  No increased work of breathing.  CARDIAC:  Regular rate and rhythm.  Normal S1 and S2.  PMI was not  enlarged, it was laterally displaced.  ABDOMEN:  Soft, nontender, nondistended.  There is no organomegaly.  Bowel sounds are present.  There is no rebound or guarding.  EXTREMITIES:  No cyanosis, clubbing, or edema.  Pulses are 2+ and  symmetric.  NEUROLOGIC:  Alert and oriented x2.  Cranial nerves are intact.  Strength is 5/5 and symmetric.   IMPRESSION:  1. Atypical chest pain.  2. Status post emergent bypass surgery secondary to a left main      dissection in 2006.  3. Mild left ventricular dysfunction secondary to emergent bypass      surgery secondary to a left main dissection in 2006.  4. Dyslipidemia.   DISCUSSION:  We will admit the patient for 23-hour observation and rule  out MI with serial EKGs and cardiac enzymes.  A 2-D echo and a stress  Myoview done road will be a very reasonable thing.  We also asked that  he correlate his symptoms of chest pain with cardiac monitor as there is  a possibility that these may be related to PVCs.      Doylene Canning. Ladona Ridgel, MD  Electronically Signed     GWT/MEDQ  D:  04/10/2008  T:  04/10/2008  Job:  [id number]   cc:   Brett Canales A. [name], M.D.

## 2011-03-19 NOTE — Assessment & Plan Note (Signed)
[facility]                            CARDIOLOGY OFFICE NOTE   NAME:Robertson, Bruce                     MRN:          [id number]  DATE:10/06/2008                            DOB:          10/29/1961    PRIMARY CARE PHYSICIAN:  None.   REASON FOR PRESENTATION:  Evaluate the patient with cardiomyopathy,  chest pain, and erectile dysfunction.   HISTORY OF PRESENT ILLNESS:  The patient presents for followup of the  above.  Since I last saw him, he has had no further chest discomfort.  He was hospitalized earlier this year.  He  has been exercising.  He  would like to a little bit more.  He wanted to take some energy drinks  by talked amount of this.  He is not having any chest pressure, neck or  arm discomfort.  In retrospect, he thinks his discomfort was probably  related to foods.  He has made food avoidance a priority and he is not  having this discomfort.  He denies any shortness of breath.  He has had  no palpitation, presyncope, or syncope.  He has had no PND.  He does  have erectile dysfunction and this is a new problem.   PAST MEDICAL HISTORY:  Cardiomyopathy (nonischemic.  EF has been about  45%), status post CABG (he had a left main dissection after  catheterization.  He underwent a saphenous vein graft to the LAD and a  saphenous vein graft to obtuse marginal), dyslipidemia, remote tobacco  abuse, reflux.   ALLERGIES:  The patient has been intolerant of ASPIRIN.   MEDICATIONS:  1. Carvedilol 12.5 mg b.i.d.  2. Lisinopril 20 mg daily.  3. Potassium 20 mEq daily.  4. Multivitamin.  5. Lipitor 20 mg daily.  6. Lasix 40 mg q.a.m. and 20 mg q.p.m.  7. Omega-3.   REVIEW OF SYSTEMS:  As stated in the HPI and otherwise negative for  other systems.   PHYSICAL EXAMINATION:  GENERAL:  The patient is in no distress.  VITAL SIGNS:  Blood pressure 112/72, heart rate 56 and regular.  HEENT:  Eyelids are unremarkable.  Pupils equal, round, and  reactive to  light.  Fundi not visualized.  Oral mucosa unremarkable.  NECK:  No jugular venous distention at 45 degrees.  Carotid upstroke  brisk and symmetric, no bruits, no thyromegaly.  LYMPHATICS:  No cervical, axillary, or inguinal adenopathy.  LUNGS:  Clear to auscultation bilaterally.  BACK:  No costovertebral angle tenderness.  CHEST:  Unremarkable.  HEART:  PMI not displaced or sustained, S1 and S2 within normal limits.  No S3, no S4, no clicks, no rubs, no murmurs.  ABDOMEN:  Flat, positive bowel sounds, normal in frequency and pitch, no  bruits, no rebound, no guarding, no midline pulsatile mass, no  hepatomegaly, no splenomegaly.  SKIN:  No rashes, no nodules.  EXTREMITIES:  Pulses 2+, no edema.   EKG sinus bradycardia, rate 53, axis within normals limits, intervals  within normal limits, nonspecific inferior T-wave changes.   ASSESSMENT AND PLAN:  1. Chest discomfort:  The patient is having atypical chest  discomfort      that is probably GI.  At this point, no further cardiovascular      testing is suggested.  He will continue with risk reduction.  2. Erectile dysfunction.  I will go ahead and give him a prescription      for Viagra.  He is not using any nitrates or other drugs that would      be high risk in combination.  If this does not work, he is going to      talk with the urologist.  3. Cardiomyopathy.  The patient has class I symptoms.  He will      continue with the meds as listed accept for the fact that I will      change his Lasix to 40 mg once a day.  He has been taking 40 mg in      the morning and 20 mg in the afternoon.  4. Obesity.  The patient is very muscular and his body mass index is      high.  He does need to lose a little weight, but he is overall fit.  5. Followup.  I will see him back in 1 year.     Bruce Rotunda, MD, [facility]  Electronically Signed    JH/MedQ  DD: 10/06/2008  DT: 10/07/2008  Job #: [id number]

## 2011-03-22 NOTE — Assessment & Plan Note (Signed)
Bruce Robertson                   COUMADIN / CHRONIC HEART FAILURE CLINIC NOTE   WOODLEY, PETZOLD                     MRN:          [id number]  DATE:07/08/2006                            DOB:          1961-05-22    [name] returns today for further evaluation and medication titration  of his congestive heart failure secondary to nonischemic cardiomyopathy.  [name] states he has been feeling quite well.  He has continued to  walk approximately one half to one mile daily at a local track.  He has  returned to the gym and is doing a light work-out two or three times a week.  He is complaining of some mild orthostatic dizziness if he sits or stands  too fast.  However, this resolves once he has stood for a few minutes. He is  going to return to work within the next two weeks.  He actually has a new  job.  He has not been employed for approximately one year.  He previously  did Holiday representative work.  Overall he states he feels good.  No chest pain.  No  orthopnea or PND.  No peripheral edema.   PAST MEDICAL HISTORY:  Nonischemic cardiomyopathy.  A.  Status post stress Myoview in June 2006 that revealed peri-infarct  ischemia and depression left ventricular ejection fraction of 33%.  B.  Status post cardiac catheterization.  However, revealed normal  coronaries.  Unfortunately, the patient developed catheter-related  dissection of the left main and subsequently underwent a two-vessel emergent  coronary artery bypass graft by Dr. Dorris Robertson.  C.  Last echocardiogram in February 2007 showed an EF of 40%.   REVIEW OF SYSTEMS:  As stated above in history of present illness.  Otherwise negative.   CURRENT MEDICATIONS:  1. Lisinopril 20 mg daily.  2. Coreg 6.25 mg b.i.d.  3. Zantac over-the-counter.  4. Lipitor 20 mg.  5. Potassium 20 mEq daily.  6. Lasix 80 mg in the a.m., 40 mg in the p.m.   PHYSICAL EXAMINATION:  VITAL SIGNS:  Weight 276,  blood pressure 118/74,  pulse 59.  GENERAL:  The patient is in no acute distress.  NECK:  Without jugular venous distension at 45-degree angle.  LUNGS:  Clear to auscultation.  CARDIOVASCULAR:  S1 and S2, regular rate and rhythm.  ABDOMEN:  Soft, nontender.  Positive bowel sounds.  EXTREMITIES:  No clubbing, cyanosis or edema.  NEUROLOGIC:  Alert and oriented x3.   IMPRESSION:  The patient with Class I to II heart failure with ejection  fraction currently at 20%.  Continue Coreg at 6.25 mg b.i.d.  The patient  still experiencing some orthostatic hypotension associated with dizziness.  Also the patient is going to be starting a new job within the next two  weeks.  Will hold off on titrating his Coreg until after he has become  situated at work and then see him back. The patient's weight is up today.  However, he does not appear to have any volume overload. He states he has  been eating more fruits and carbs lately.  We discussed diet modification.  I will see the  patient back in six weeks after he has started working and  see how he is tolerating his medication at that time.                                 Dorian Pod, ACNP                            Rollene Rotunda, MD, [facility]   MB/MedQ  DD:  07/08/2006  DT:  07/08/2006  Job #:  [id number]

## 2011-03-22 NOTE — Letter (Signed)
December 09, 2008     RE:  SUEDE, GREENAWALT  MRN:  [id number]  /  DOB:  [birth year]   To Whom It May Concern:   I have ordered a sleep study on [name].  Per requirements,  this note needs to be dictated describing the necessity of this.  The  patient has apparent sleep apnea.  He has snoring and daytime  somnolence.  He has body habitus consistent with this.  He has had  hypertension and cardiomyopathy.  At this point, I think the possibility  of obstructive sleep apnea contributing to some of his ongoing symptoms  and cardiovascular problems is quite high.  Therefore, a sleep study is  indicated.    Sincerely,      Rollene Rotunda, MD, [facility]  Electronically Signed    JH/MedQ  DD: 12/09/2008  DT: 12/10/2008  Job #: [id number]

## 2011-03-22 NOTE — Op Note (Signed)
NAME:  CHAVEZ, ROSOL NO.:  [id number]   MEDICAL RECORD NO.:  [id number]          PATIENT TYPE:  INP   LOCATION:  2315                         FACILITY:  [facility]   PHYSICIAN:  Salvatore Decent. Dorris Fetch, M.D.DATE OF BIRTH:  May 08, 1961   DATE OF PROCEDURE:  08/20/2005  DATE OF DISCHARGE:                                 OPERATIVE REPORT   PREOPERATIVE DIAGNOSIS:  Left main dissection, dilated cardiomyopathy.   POSTOPERATIVE DIAGNOSIS:  Left main dissection, dilated cardiomyopathy.   PROCEDURES:  1.  Emergency median sternotomy, extracorporeal circulation, coronary artery      bypass grafting x2 (saphenous vein graft to LAD, saphenous vein graft to      obtuse marginal 1).  2.  Endoscopic vein harvest, left thigh.  3.  Right atrial and left ventricular biopsies.   SURGEON:  Salvatore Decent. Dorris Fetch, MD   ASSISTANT:  Pecola Leisure, PA   ANESTHESIA:  General.   FINDINGS:  Dilated hypertrophied heart, ejection fraction approximately 30%  by transesophageal echocardiography, no significant valvular pathology,  normal-appearing coronaries, good-quality vein.   CLINICAL NOTE:  Mr. Covin is a 50 year old gentleman who is currently  undergoing workup with a dilated cardiomyopathy.  He underwent cardiac  catheterization to rule out ischemic cardiomyopathy or significant coronary  artery disease.  With the first injection into the left coronary system,  there was noted to be a left main dissection.  The patient was  hemodynamically stable, but there was clear dissection of the left main  coronary artery; this was not amenable to stenting after review with Dr.  Gala Romney and Dr. Riley Kill, who felt that the left main and LAD were too  large to safely deploy a stent; likewise, a stent would have had to cross  the origin of the large circumflex branch.  With discussion with the  patient, he was advised to undergo urgent coronary bypass grafting to  protect the distal  circulation in the event of a left main occlusion or  further dissection.  The indications, benefits, and alternatives were  discussed with the patient and his wife; they both understood the risks of  surgery and agreed to proceed.   OPERATIVE NOTE:  Mr. Edenfield was brought urgently from the catheterization  laboratory to the operating room.  There, lines were placed by Dr. Ivin Booty of  the anesthesia service to monitor arterial, central venous and pulmonary  arterial pressure.  Intravenous antibiotics were administered.  The patient  was anesthetized and intubated.  A Foley catheter was placed.  Transesophageal echocardiography was performed; it revealed dilated  hypertrophied cardiomyopathy with an ejection fraction of approximately 30%.  There was no significant valvular pathology.  The chest, abdomen and legs  were prepped and draped in the usual fashion.   An incision was made in the medial aspect of the right leg at the level of  the knee.  Two small veins were identified which crossed each other.  No  dominant saphenous vein was identified at that site, therefore incision was  made in the medial aspect of the left leg at the level of the knee.  The  greater  saphenous vein was identified and then was harvested from the left  thigh endoscopically.  Simultaneously, a median sternotomy was performed.  Five thousand units of heparin were administered during the harvest of the  vein graft.  At the completion of the harvest, the remainder of the full  heparin dose was administered.  After the vein was prepared, the pericardium  was opened.  The ascending aorta was inspected and palpated; there was no  palpable atherosclerotic disease; it was of normal size and appearance.  The  aorta was cannulated via concentric 2-0 Ethibond pledgeted pursestring  sutures.  A dual-stage venous cannula was placed via a pursestring suture in  the right atrial appendage.  A small tip of the appendage which  was excised  to allow placement of the cannula was sent for pathology.  Cardiopulmonary  bypass was instituted and the patient was cooled to 32 degrees Celsius.  Next a Tru-Cut needle biopsy of the left ventricular free wall was  performed.  The epicardial surface was oversewn with a 6-0 Prolene figure-of-  eight suture.  The coronary arteries were inspected and anastomotic sites  were chosen.  The conduit was inspected and cut to length.  A foam pad was  placed in the pericardium.  A temperature probe was placed in the myocardial  septum and a cardioplegia cannula was placed in the ascending aorta.   The aorta was crossclamped.  The left ventricle was emptied via the aortic  root vent.  Cardiac arrest then was achieved with a combination of cold  antegrade blood cardioplegia and topical iced saline.  After achieving a  complete diastolic arrest and adequate myocardial septal cooling, the  following distal anastomoses were performed.   First, a reversed saphenous vein graft was placed end-to-side to obtuse  marginal 1; this was a 2-mm good-quality target.  The vein graft was of good  quality.  The anastomosis was performed with a running 7-0 Prolene suture.  There was excellent flow through the graft.  Cardioplegia was administered.  There was good hemostasis.   Next, a reversed saphenous vein graft was placed end-to-side to the left  anterior descending; this was a 2-mm good-quality target.  The vein graft  was of good quality.  The anastomosis was performed end-to-side with a  running 7-0 Prolene suture.  Again, there was excellent flow through this  graft.  Cardioplegia was administered.  There was good hemostasis.   Additional cardioplegia then was administered.  The cardioplegic cannulas  were removed from the ascending aorta.  The proximal vein graft anastomoses  were then performed to 4.5-mm punch aortotomies with running 6-0 Prolene sutures.  At the completion of the final  proximal anastomoses, the patient  was placed in Trendelenburg position.  The aortic root was de-aired.  Lidocaine was administered.  The aortic crossclamp was removed.  The total  crossclamp time was 38 minutes.   While the patient was being rewarmed, all proximal and distal anastomoses  were inspected for hemostasis.  Epicardial pacing wires were placed on the  right ventricle and right atrium.  The patient did require single  defibrillation, then was in sinus rhythm.  He was atrially paced for rate  due to bradycardia.  When he had rewarmed to a core temperature of 37  degrees Celsius, he was weaned from cardiopulmonary bypass without inotropic  support.  The total bypass time was 63 minutes.  The postbypass  transesophageal echocardiography revealed no change in left jugular function  or valvular  function post bypass.   A test dose of protamine was administered and was well-tolerated.  The  atrial and aortic cannulae were removed.  The remainder of the protamine was  administered without incident.  The chest was irrigated with 1 L of normal  saline and 1 g of vancomycin.  Hemostasis was achieved.  Two mediastinal  chest tubes were placed through separate subcostal incisions.  The  pericardium was reapproximated with interrupted 3-0 silk sutures and came  together easily without tension.  The sternum was closed with interrupted  heavy-gauge stainless steel wires.  The incisions were  then closed in standard fashion.  All sponge, needle and instrument counts  were correct at the end of the procedure.  There no intraoperative  complications and the patient was taken from the operating room to the  surgical intensive care unit in critical, but stable condition.           ______________________________  Salvatore Decent Dorris Fetch, M.D.     SCH/MEDQ  D:  08/20/2005  T:  08/21/2005  Job:  [id number]   cc:   Arvilla Meres, M.D. [facility]  [employer]  [address]  [place]  [place]  [address]   Rollene Rotunda, M.D.  [address]  [address]  [place]  [place] [address]   [name]. [name], M.D.  Fax: [phone]

## 2011-03-22 NOTE — Assessment & Plan Note (Signed)
[facility]                            CARDIOLOGY OFFICE NOTE   Bruce Robertson, Bruce Robertson                     MRN:          [id number]  DATE:01/20/2007                            DOB:          26-Jan-1961    REASON FOR PRESENTATION:  A patient with cardiomyopathy.   HISTORY OF PRESENT ILLNESS:  The patient presents for followup of his  cardiomyopathy.  Since I last saw him he has been doing well.  He has  had some chest discomfort when he moves a certain direction or moves his  arm a certain way.  This is usually fleeting.  He has had no substernal  chest pressure.  He denies any neck discomfort.  He has no shortness of  breath, denies any PND or orthopnea.  He has had no palpitations,  presyncope or syncope.   PAST MEDICAL HISTORY:  Cardiomyopathy, non-ischemic (EF 40%), status  post CABG after left main dissection during catheterization (SVG through  LAD and SVG obtuse marginal), dyslipidemia, remote tobacco use, reflux.   ALLERGIES:  He has been intolerant of ASPIRIN.   MEDICATIONS:  1. Coreg 12.5 mg b.i.d.  2. Lisinopril 20 mg daily.  3. Potassium 20 mEq.  4. Lipitor 20 mg three times a week.  5. Multivitamin.  6. Lasix 80 mg q. a.m. and 40 mg q. p.m.   REVIEW OF SYSTEMS:  As stated in the HPI, otherwise negative for other  systems.   PHYSICAL EXAMINATION:  The patient is in no distress.  Blood pressure 110/71, heart rate is 65 and regular.  HEENT:  Eyes unremarkable, Pupils are equal and reactive to light.  Fundi not visualized.  Oral mucosa unremarkable.  NECK:  No jugular venous distention.  Wave form is normal. Carotid  upstroke brisk and symmetric, no bruits, no thyromegaly.  LYMPHATICS:  No adenopathy.  LUNGS:  Clear to auscultation bilaterally.  BACK:  No costovertebral angle tenderness.  CHEST:  Well-healed sternotomy scar.  HEART:  PMI not displaced or sustained.  S1 and S2 within normal limits.  No S3, no S4, no clicks, no  rubs, no murmurs.  ABDOMEN:  Mildly obese, positive bowel sounds, normal in frequency and  pitch, no bruits, no rebound, no guarding, no midline pulse or  hepatosplenomegaly.  SKIN:  No rashes.  Denies.  EXTREMITIES:  2+ pulses, no edema.   EKG:  Sinus rhythm, right axis deviation, old inferior infarct, no acute  ST-T wave changes.   ASSESSMENT AND PLAN:  1. The patient is doing well from a cardiovascular standpoint. He is      having some atypical chest pain that is non-anginal.  No further      cardiovascular testing is suggested.  He will continue with      management of his mild to moderately reduced ejection fraction.  2. Cardiomyopathy as above.  He will continue Coreg and lisinopril.      He will continue his current dose of Lasix with salt and fluid      restriction.  3. Followup.  I will see him back in about 6 months or sooner if  needed.     Rollene Rotunda, MD, [facility]  Electronically Signed    JH/MedQ  DD: 01/20/2007  DT: 01/20/2007  Job #: [id number]

## 2011-03-22 NOTE — Assessment & Plan Note (Signed)
[facility]                          CHRONIC HEART FAILURE NOTE   NAME:WILLIAMSSuhaas, Agena                     MRN:          [id number]  DATE:11/10/2006                            DOB:          1961-04-02    PRIMARY CARDIOLOGIST:  Rollene Rotunda, MD, [facility].   PRIMARY CARE PHYSICIAN:  Brett Canales A. Daub, M.D.   Mr. Holifield returns today for further evaluation and medication  titration of his congestive heart failure which is associated with  nonischemic cardiomyopathy. Mr. Mccarry states he has been doing well.  I last followed him in December. At that time, he had been accompanied  by his wife who was concerned that Mr. Bozzo continued to do  intensive weight resistant exercises at the gym and Mr. Oyen was  experiencing episodes of chest discomfort. I scheduled the patient for a  stress Myoview which he underwent on January 4. Stress test was  interpreted by Dr. Myrtis Ser whose overall impression was as stated. There is  scar at the base of the inferior wall that is old. There is scar with  ischemia in the anterolateral wall. The overall area affected seems less  than October 2006 stress test but the ischemia seems to be more  prominent. I reviewed the results with Dr. Antoine Poche who also noted  abnormality on stress Myoview. In talking with Mr. Drumwright today, he  continues to have episodes of chest discomfort. He states the frequency  is about the same but the intensity of the chest discomfort has become  worse. It continues to be a sharp pressure under his left breast in the  setting of an abnormal stress test. Mr. Wimer has a history of  progressive shortness of breath and chest pressure. He underwent a  Cardiolite exam in 2006 that was highly abnormal. The patient was thus  referred for cardiac catheterization which he underwent in October 2006.  The patient was found to have normal coronaries at that time, however  underwent a cardiac related  dissection of the left main into the ostial  LAD. The patient went for emergent bypass with placement of a vein graft  to the LAD as well as the left circumflex. Since that time, the patient  has done remarkably well and has tolerated medications for his  cardiomyopathy and had previously been able to walk approximately 1/2 to  1 mile daily at a local track. However as previously stated, the patient  has had some chest discomfort that he first noticed in November of last  year with an increase in intensity since that time.   PAST MEDICAL HISTORY:  1. Congestive heart failure secondary to nonischemic cardiomyopathy.      Most recent echocardiogram showed an EF of 40%.  2. Status post cardiac catheterization in 2006 showing normal coronary      arteries with severe nonischemic cardiomyopathy status post      catheter related dissection of the left main into the ostial LAD.      The patient underwent emergent bypass  grafting x2 with saphenous      vein graft to the LAD and a saphenous  vein graft to the obtuse      marginal 1.  3. Chest pain status post recent stress Myoview showing high degree of      abnormality.  4. History of previous ETOH abuse.  5. History of GERD.  6. History of dyslipidemia currently on statin.  7. Remote history of tobacco use.  8. Mild mitral regurgitation.  9. Hypotension.  10.Family history of premature coronary artery disease with the father      dying at age 26 of heart failure.   REVIEW OF SYSTEMS:  As stated above in the history of present illness,  otherwise negative.   ALLERGIES:  The patient has an aspirin intolerance.   CURRENT MEDICATIONS:  1. Lisinopril 20 mg daily.  2. Lasix 80 mg q.a.m. and 40 mg p.m.  3. Potassium 20 mEq daily.  4. Lipitor 20 mg 3 times a week.  5. Zantac daily.  6. Coreg 12.5 mg p.o. b.i.d.   IMPRESSION:  Nonischemic cardiomyopathy with ejection fraction currently  36-40%. Patient with complaints of chest discomfort  in the setting of  abnormal stress test. I feel that the patient would benefit from a  cardiac CT scan for further evaluation of his chest discomfort. The  patient has several risk factors for coronary artery disease including  hyperlipidemia, hypertension, previous history of tobacco use, also with  family history of premature heart disease. I have spoken with Dr.  Antoine Poche who agrees that it would be most beneficial to start with a  cardiac CT scan. I am also going to start the patient on 15 mg of Imdur  and have him followup with Dr. Antoine Poche after his cardiac CT scan for  further evaluation of his chest discomfort. Will continue medications at  current dose for now.      Dorian Pod, ACNP  Electronically Signed      Rollene Rotunda, MD, [facility]  Electronically Signed   MB/MedQ  DD: 11/10/2006  DT: 11/10/2006  Job #: [id number]   cc:   Brett Canales A. [name], M.D.

## 2011-03-22 NOTE — Assessment & Plan Note (Signed)
[facility]                   COUMADIN / CHRONIC HEART FAILURE CLINIC NOTE   KROSBY, RITCHIE                     MRN:          [id number]  DATE:09/09/2006                            DOB:          1961-06-21    Bruce Robertson returns today for further evaluation and medication titration  of his congestive heart failure which is secondary to nonischemic  cardiomyopathy. Bruce Robertson states he has been feeling quite well. He  continues to walk approximately one half to one mile daily at a local track.  He also continues to work out at [employer] In discussing Bruce Robertson' gym  workout with him and his wife, apparently Bruce Robertson has returned to the  same intensity he previously worked out in the gym setting and has bulked up  tremendously since I last saw him, however, has lost some weight.  Previously, I tried to titrate his Coreg as Bruce Robertson had a history of  nonischemic cardiomyopathy with an EF of 33% in the past. Bruce Robertson did  not tolerate the Coreg titration very well previously, and I had backed off  on the dose. I repeated his echocardiogram just recently with left  ventricular ejection fraction estimated at 40% at this time. Bruce Robertson  has a tendency to downplay his symptoms, but speaking with his wife, it does  take him a day to recuperate after a strenuous workout at the gym, and he  has noticed that seems to have increased in frequency since he started  working out more strenuously. He denies any chest discomfort or shortness of  breath. 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 He currently is still looking  for new employment. Overall, he states he feels pretty good. No orthopnea or  PND. No presyncope or syncopal episodes. No peripheral edema.   PAST MEDICAL HISTORY:  1. Congestive heart failure secondary to nonischemic cardiomyopathy,      status post stress Myoview in June of 2006 that revealed peri-infarct      ischemia  and depression with a left ventricular ejection fraction of      33%. Most recent echocardiogram shows an EF of 40%.  2. Status post cardiac catheterization that revealed normal coronaries.      Unfortunately, the patient developed catheter-related dissection of the      left main and subsequently underwent a 2-vessel emergent coronary      artery bypass graft by Dr. Dorris Fetch in 2006.   REVIEW OF SYSTEMS:  As stated above in history of present illness.   CURRENT MEDICATIONS:  1. Lisinopril 20 mg daily.  2. Lasix 80 mg q.a.m. and 40 mg q.p.m.  3. Potassium 20 mEq daily.  4. Lipitor 20 mg 3 times a week.  5. Zantac daily.  6. Gas-X daily.  7. Multivitamin daily.  8. Coreg 3.125 mg b.i.d.   REVIEW OF SYSTEMS:  As stated above in history of present illness, otherwise  negative.   PHYSICAL EXAMINATION:  Weight 267 pounds, down from 276 pounds on September  4. Blood pressure 120/78 with a pulse of 62.  Bruce Robertson is in no acute distress.  No jugular vein distention  noted at 45-degree angle.  LUNGS:  Clear to auscultation bilaterally.  CARDIOVASCULAR:  Reveals a regular rate and rhythm.  ABDOMEN:  Soft, nontender. Positive bowel sounds.  LOWER EXTREMITIES:  Without clubbing, cyanosis, or edema.  OVERALL APPEARANCE:  The patient has bulked up throughout his shoulders and  chest in regards to muscle mass.   IMPRESSION:  Stable Class 2 heart failure. I have had a long discussion with  patient and his wife, greater than 30 minutes of education, regarding his  strenuous gym routine and heart failure and his current echocardiogram  results. Bruce Robertson does not qualify for defibrillator at this time with  an ejection fraction of 40% and essentially asymptomatic in regards to  volume overload. However, he is on a very low-dose beta blocker. I am going  to attempt to increase his Coreg again. I am going to have him increase the  p.m. dose to 6.25 mg for 4 days at bedtime. If he  tolerates this, we will  have him increase the a.m. dose also and have him hold off on his gym visits  for the next few days to see how he tolerates it. At some point, I may need  to cut back the lisinopril or his Lasix to give room for Coreg titration.  Bruce Robertson is aware of the plan, and he has my phone number. He can call  me at any time if he has any problems or experiences any side effects from  the Coreg titration. He and his wife are both aware of this. I will see him  back in 4 weeks for further evaluation.   ADDENDUM:  Earlier the patient was here with his wife. The wife stated the  patient was taking 3.125 mg of Coreg b.i.d. and I had adjusted the dose  today per that dose. However wife has just called me from home and states  that the patient's Coreg dose is 6.25 mg b.i.d. The patient assures his wife  that is what he is taking currently. Apparently the patient had an old  prescription of the Coreg that he had gotten filled, did not realize the  dose was 6.25 mg and he has been taking that anyway instead of the 3.125 mg.  I instructed her to go ahead and begin titrating his P.M. dose 1-1/2 tablets  which is equivalent to 9.375 mg at bedtime for 4 days, continue the 6.25 mg  in the a.m. If patient tolerates this dose will have her increase the a.m.  dose also to 9.375 mg. I will see the patient back in 4 weeks. If he has any  problems, he is instructed to call me.     Dorian Pod, ACNP  Electronically Signed    MB/MedQ  DD: 09/09/2006  DT: 09/10/2006  Job #: [id number]

## 2011-03-22 NOTE — Assessment & Plan Note (Signed)
[facility]                   COUMADIN / CHRONIC HEART FAILURE CLINIC NOTE   NAME:[name], Bruce Robertson                     MRN:          [id number]  DATE:09/09/2006                            DOB:          01-21-1961    ADDENDUM:  Earlier the patient was here with his wife. The wife stated the  patient was taking 3.125 mg of Coreg b.i.d. and I had adjusted the dose  today per that dose. However wife has just called me from home and states  that the patient's Coreg dose is 6.25 mg b.i.d. The patient assures his wife  that is what he is taking currently. Apparently the patient had an old  prescription of the Coreg that he had gotten filled, did not realize the  dose was 6.25 mg and he has been taking that anyway instead of the 3.125 mg.  I instructed her to go ahead and begin titrating his P.M. dose 1-1/2 tablets  which is equivalent to 9.375 mg at bedtime for 4 days, continue the 6.25 mg  in the a.m. If patient tolerates this dose will have her increase the a.m.  dose also to 9.375 mg. I will see the patient back in 4 weeks. If he has any  problems, he is instructed to call me.      Dorian Pod, ACNP  Electronically Signed      Bevelyn Buckles. Bensimhon, MD  Electronically Signed   MB/MedQ  DD: 09/09/2006  DT: 09/10/2006  Job #: [id number]

## 2011-03-22 NOTE — Assessment & Plan Note (Signed)
[facility]                            CARDIOLOGY OFFICE NOTE   NAME:WILLIAMSBurton, Gahan                     MRN:          [id number]  DATE:12/02/2006                            DOB:          12-05-1960    This is a patient of Dr. Jenene Slicker.  He has no primary MD at this  point.   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  The patient recently had recurrent  chest pain and a Cardiolite showing anterolateral wall ischemia.  He  therefore was readmitted for cardiac catheterization on November 21, 2006.  This revealed SVG to the OM was patent and normal and SVG to the  LAD was widely patent with 1 proximal 25% lesion, nonobstructive  coronary artery disease.  There was no residual evidence of the left  main dissection; it seems to have healed.  He has excellent native  vessel flow and only mildly reduced ejection fraction, mild global  hypokinesis, the ejection fraction 50% which is improved from his last  EF of 40% on recent echocardiogram.   Since the patient has been home, he has done quite well.  He still has  occasional sharp, stabbing chest pain that he says is like a large bee  sting, but it is fleeting.  He has no chest heaviness or pressure.   CURRENT MEDICATIONS:  1. Lisinopril 20 mg daily.  2. Potassium 20 mEq daily.  3. Lipitor 20 mg 3 times weekly.  4. Gas-X p.r.n.  5. Multivitamin daily.  6. Ranitidine p.r.n.  7. Coreg 6.25 mg 2 twice daily.  8. Lasix 80 mg in the morning 40 in the evening.  9. Isosorbide 30 mg 1/2 daily.   PHYSICAL EXAMINATION:  GENERAL:  This is a pleasant 50 year old black  male in no acute distress.  VITAL SIGNS:  Blood pressure is 92/64, pulse 60, weight 268.  NECK:  Without JVD, HR, bruit, or thyroid  enlargement.  LUNGS:  Clear anteroposterior and lateral.  HEART:  Regular rate and rhythm.  60 beats per minute, normal S1 and S2.  No murmur, rub, bruit, thrill, or heave noted.  ABDOMEN:  Soft, without organomegaly, masses, lesions, or abdominal  tenderness.  Right groin without hematoma or hemorrhage.  EXTREMITIES:  Without cyanosis, clubbing, or edema.  He has good distal  pulses.   IMPRESSION:  1. Chest pain and abnormal Cardiolite, but cardiac catheterization      revealed nonobstructive coronary artery disease, healed left main      dissection, and excellent native flow as well as saphenous vein      graft flow.  Ejection fraction 50%.  Mild global hypokinesis.  2. Nonischemic cardiomyopathy.  Ejection fraction improved.  3. Status post emergency CABG x2 with an saphenous vein graft to the      left anterior descending and saphenous vein graft to the obtuse      marginal in 2006 for catheter-related left main dissection.  4. History of gastroesophageal reflux disease.  5. History of dyslipidemia, treated.  6. Family history of premature coronary artery disease.  7. History of previous alcohol abuse.  8. Mild mitral regurgitation.   PLAN:  The patient is doing quite well from a cardiac standpoint.  He  did need a refill on his isosorbide, but I told him he could stop this  as this was just started right before his cardiac cath, and it gives him  severe headaches.  I have also given him refills on Coreg.  I gave him  12.5 b.i.d. and potassium 20 mEq daily.  He has an appointment to see  Dr. Antoine Poche back in March.      Jacolyn Reedy, PA-C  Electronically Signed      Arturo Morton. Riley Kill, MD, [facility]  Electronically Signed   ML/MedQ  DD: 12/02/2006  DT: 12/02/2006  Job #: [id number]

## 2011-03-22 NOTE — Assessment & Plan Note (Signed)
Bruce Robertson                          CHRONIC HEART FAILURE NOTE   NAME:[name], [name]                     MRN:          [id number]  DATE:10/09/2006                            DOB:          12/14/1960    PRIMARY CARDIOLOGIST:  Dr. Rollene Rotunda.   PRIMARY CARE PHYSICIAN:  Dr. Earl Lites.   Bruce Robertson returns today for further evaluation and medication  titration of his congestive heart failure assoc with non ischemic  cardiomyopathy. Bruce Robertson states he has been doing well. I last saw  him in November at which time we had a long talk about his increased  activity at the gym. He was bench pressing up to 300 pounds and doing  some pretty extreme work outs that could potentially strain his heart.  Bruce Robertson states he has decreased the work load since that time and  has tried to do more cardio work out instead of extreme weight  resistance. Overall he states he feels very good. He has a sense of  having some pressure in his left chest however. He has noted exertion on  walking but not necessarily at the gym. He states sometimes it is  relieved if he just changes position. Bruce Robertson has non ischemic  cardiomyopathy. He underwent a cardiac catheterization in 2006 that  showed normal coronaries. Unfortunately the patient developed cath-  related dissection of the left main and subsequently underwent a two-  vessel emergent bypass by Dr.  Dorris Fetch at that time. Otherwise no  shortness of breath, no orthopnea or PND. Tolerating medications without  problems.   PAST MEDICAL HISTORY:  1. Congestive heart failure with secondary non ischemic      cardiomyopathy. Stress Myoview in June of 2006 revealed peri-      infarct ischemia and depression with a left ventricular ejection      fraction of 33%.  Most recent echo shows an EF of 40% status post      emergent coronary artery bypass graft in 2006 secondary to cath-      related dissection of  the left main.  2. History of GERD.  3. Dyslipidemia.  4. Remote history of tobacco use.  5. Mild mitral regurgitation.  6. Hypertension.  7. Questionable intolerance to aspirin.   REVIEW OF SYSTEMS:  As stated above, otherwise negative.   CURRENT MEDICATIONS:  1. Lisinopril 20.  2. Lasix 80 q.a.m., 40 p.m.  3. Potassium 20.  4. Lipitor 20 x3 weekly.  5. Gas-Ex p.r.n.  6. Multivitamin daily.  7. Coreg 9.375 mg b.i.d.  8. Zantac daily.   PHYSICAL EXAMINATION:  VITAL SIGNS: Weight 270. Blood pressure 119/70  with a pulse of 73.  GENERAL: Bruce Robertson is in no acute distress. No jugular venous  distension at 45 degree angle.  LUNGS: Clear to auscultation.  CARDIOVASCULAR EXAM: Reveals S1, S2 regular rate and rhythm.  ABDOMEN: Positive bowel sounds.  LOWER EXTREMITIES: Without clubbing, cyanosis or edema.   IMPRESSION:  1. Stable Class I heart failure at this time. Will plan on increasing      his Coreg to 12.5 mg  b.i.d. Will have him start with the increased      dose in the evening for 3-4 days, if he tolerates this will have      him increase the morning dose also. Have given him a new      prescription for 12.5 mg tablets b.i.d.  2. Chest discomfort - sounds rather atypical. However I am going to go      ahead and repeat a stress Myoview on patient for reassurance as he      has a family history of premature coronary artery disease/MIs in      his father. Will continue other medications and checked a 12-lead      EKG which shows normal sinus rhythm. No acute ST or T wave changes.   FOLLOW UP:  I will plan on seeing patient back after his stress Myoview,  sooner if he has any problems.      Dorian Pod, ACNP  Electronically Signed      Bevelyn Buckles. Bensimhon, MD  Electronically Signed   MB/MedQ  DD: 10/09/2006  DT: 10/10/2006  Job #: [id number]   cc:   Brett Canales A. [name], M.D.

## 2011-03-22 NOTE — Cardiovascular Report (Signed)
NAME:  THINH, CUCCARO NO.:  [id number]   MEDICAL RECORD NO.:  [id number]          PATIENT TYPE:  OIB   LOCATION:  1967                         FACILITY:  [facility]   PHYSICIAN:  Arvilla Meres, M.D. LHCDATE OF BIRTH:  10/11/1961   DATE OF PROCEDURE:  08/20/2005  DATE OF DISCHARGE:                              CARDIAC CATHETERIZATION   PRIMARY CARE PHYSICIAN:  Brett Canales A. [name], M.D.   CARDIOLOGIST:  Rollene Rotunda, M.D.   PATIENT IDENTIFICATION:  Bruce Robertson is a 50 year old male without  significant past medical history who developed a several-month history of  progressive shortness of breath and chest pressure.  He was evaluated by Dr.  Antoine Poche in office and underwent a Cardiolite which was abnormal showing an  EF of 32% with left ventricular enlargement and a prior anterior and  inferior infarcts with mild peri-infarct ischemia.  Followup echo showed an  EF of 25%.  He was thus referred for cardiac catheterization.  This was  performed in the outpatient lab.   PROCEDURES PERFORMED:  1.  Selective coronary angiography.  2.  Left heart catheterization.  3.  Left ventriculogram.  4.  Right heart cath with Fick cardiac output.   DESCRIPTION OF PROCEDURE:  The risks and benefits of the catheterization  were explained to Mr. Reger, consent was signed and placed in the chart.  A 4-French arterial sheath was placed in right femoral artery using a  modified Seldinger technique.  A 7-French venous sheath was placed in right  femoral vein using a similar technique.  Standard catheters including JL-4,  JR-4, and angled pigtail were used for the procedure.  After completion of  the right heart catheterization, the JL-4 was placed in ascending aorta and  then engaged in the left main.  Initial pictures showed dissection in the  left main extending into the LAD.  Followup imaging of this showed a stable  dissection without any loss of the distal artery. The the right  coronary was  then imaged with a JR-4 and then the left ventriculogram was performed.   COMPLICATIONS:  Dissection of the left main into the LAD.   1.  Right atrial pressure with mean of 6.  2.  RV pressure was 32/3.  3.  PA pressure was 31/14 with a mean of 21.  4.  Pulmonary capillary wedge pressure had a mean of 15 with V-wave of 21.  5.  Central aortic pressure was 120/70 with a mean of 90.  6.  LV pressure was 123/8 with an EDP of 13.  There is no gradient across      the aortic valve.  7.  Femoral artery sat was 92%.  8.  PA sat was 62%.  9.  Fick cardiac output was 4.9 L/min.  Fick cardiac index was 2.01      L/min/m2.   1.  Left main was a large vessel, approximately 67-mm in diameter.  There      was evidence of a dissection from the distal left main at the proximal      LAD.  There was no atherosclerotic disease.  2.  The LAD was a large vessel coursing to the apex.  It gave off a large      proximal diagonal.  There was no evidence of significant coronary      atherosclerosis.  As above, there was dissection in the ostial portion,      extending from the left main into the ostial LAD.  3.  The left main was apparently 5-to-6-mm in diameter with preserved 3-to-4-      mm of lumen remaining despite the dissection.  4.  The left circumflex was a large vessel.  It gave off a large OM-1 and a      small OM-2.  There is no atherosclerotic disease.  5.  The right coronary artery was a large dominant vessel.  It gave off a      large PDA into three small PLs.  There is no angiographic CAD.   LEFT VENTRICULOGRAM:  Done the RAO approach, showed an EF of 20-25% with a  markedly dilated ventricle.  There was severe hypokinesis of the anterior  and apical walls.  The inferior apical lesions were akinetic.  There was 1+  mitral regurgitation.   ASSESSMENT:  1.  Normal coronary arteries.  2.  Severe nonischemic cardiomyopathy, question of alcohol related      cardiomyopathy.  3.   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   FOLLOWUP:  Followup images show that the LAD remains patent but the  dissection remains.   DISCUSSION/PLAN:  I have reviewed the films with Dr. Riley Kill, Dr. Samule Ohm,  and Dr. Dorris Fetch from [facility].  We have discussed the risks and benefits of  possible left main stenting versus bypass surgery.  Given that the left  circumflex is a huge vessel and would require stenting across this vessel,  the decision has been made to proceed with bypass surgery putting a vein  graft to the LAD as well as the left circumflex.  This will be done  emergently.  I have explained the risks and benefits in detail to both Mr.  Coppola and his wife, and they agree to proceed.  Postoperatively, he will  need aggressive management of his cardiomyopathy including absence from  alcohol and medication titration.  If the EF remains low, he will need to be  considered for a defibrillator.      Arvilla Meres, M.D. [facility]  Electronically Signed     DB/MEDQ  D:  08/20/2005  T:  08/20/2005  Job:  [id number]

## 2011-03-22 NOTE — Cardiovascular Report (Signed)
NAME:  Bruce Robertson, Bruce Robertson NO.:  [id number]   MEDICAL RECORD NO.:  [id number]          PATIENT TYPE:  OIB   LOCATION:  NA                           FACILITY:  [facility]   PHYSICIAN:  Rollene Rotunda, MD, FACCDATE OF BIRTH:  1961/04/07   DATE OF PROCEDURE:  11/21/2006  DATE OF DISCHARGE:                            CARDIAC CATHETERIZATION   PRIMARY CARE PHYSICIAN:  None.   PROCEDURES:  1. Left heart catheterization.  2. Coronary arteriography.   INDICATIONS:  Evaluate patient with chest pain.  His Cardiolite  suggested worsening reversible perfusion defects compared to his  previous.  There is anterolateral wall ischemia.  The patient previously  had no obstructive coronary disease but at diagnostic catheterization  for evaluation of cardiomyopathy and an abnormal Cardiolite, he had a  dissection of his left main and had two-vessel CABG.   PROCEDURE NOTE:  Left heart catheterization was performed via the right  femoral artery.  The artery was cannulated using an anterior wall  puncture.  A #4-French arterial sheath was inserted via the modified  Seldinger technique.  Preformed Judkins and a pigtail catheter were  utilized.  The patient tolerated the procedure well and left the lab in  stable condition.  We also needed to use a left bypass graft catheter.   RESULTS:   HEMODYNAMICS:  LV 85/13, AO 87/72.   CORONARIES:  The left main was normal.  The LAD was large and normal.  It was not visualized very well with antegrade native vessel flow as  there was undyed blood from the widely patent vein graft.  However, it  was seen to backfill with the vein graft injection and it was clear  there was no obstructive LAD disease.  The first diagonal was large and  normal.  The circumflex was normal.  There was a large mid obtuse  marginal which was widely patent.  There was also seen to be a vein  graft that back-filled slightly in the native injections.  Right  coronary artery  was large and dominant and normal.  There was a moderate  size PDA which was normal.  There was moderate size posterolateral which  was normal.   GRAFTS:  The saphenous vein graft to the OM was patent and normal.  A  saphenous vein graft to the LAD was widely patent with one proximal 25%  lesion.   LEFT VENTRICULOGRAM:  The left ventriculogram was obtained in the RAO  projection.  There was quite a bit of ectopy.  However, when this  settled out, it appeared that there was some very mild global  hypokinesis with an EF of approximately 50%.   CONCLUSION:  1. No obstructive coronary disease.  2. I do not see residual evidence of the left main dissection.  It      seems to have healed.  3. He has excellent native vessel flow.  4. He also has excellent saphenous vein graft flow.  5. He has a mildly reduced ejection fraction.   PLAN:  1. The patient will continue to be followed for non-anginal chest      pain.  2. He will continue to be managed medically for his nonischemic      cardiomyopathy.      Rollene Rotunda, MD, [facility]  Electronically Signed     JH/MEDQ  D:  11/21/2006  T:  11/21/2006  Job:  [id number]

## 2011-03-22 NOTE — Discharge Summary (Signed)
NAME:  Bruce Robertson, Bruce Robertson NO.:  [id number]   MEDICAL RECORD NO.:  [id number]          PATIENT TYPE:  INP   LOCATION:  2039                         FACILITY:  [facility]   PHYSICIAN:  Salvatore Decent. Dorris Fetch, M.D.DATE OF BIRTH:  July 02, 1961   DATE OF ADMISSION:  08/20/2005  DATE OF DISCHARGE:  08/24/2005                                 DISCHARGE SUMMARY   PRIMARY ADMITTING DIAGNOSES:  1.  Chest pain.  2.  Shortness of breath.   ADDITIONAL/DISCHARGE DIAGNOSES:  1.  Dilated cardiomyopathy.  2.  Left main dissection.  3.  History of gastroesophageal reflux disease.  4.  Dyslipidemia.   PROCEDURES PERFORMED:  1.  Cardiac catheterization.  2.  Emergency coronary artery bypass grafting x2 (saphenous vein graft to      the LAD, saphenous vein graft to the first obtuse marginal).  3.  Endoscopic vein harvest left side.  4.  Right atrial and left ventricular biopsies.   HISTORY:  The patient is a 50 year old black male who was recently referred  to [facility] for evaluation of an abnormal EKG which was found when  he was undergoing evaluation for a new job. He reported several episodes  recently of chest pain and pressure with associated shortness of breath. He  underwent an echocardiogram and was found to have a dilated cardiomyopathy.  It was recommended that he undergo cardiac catheterization at this time in  order to delineate his coronary anatomy. He is brought in for outpatient  catheterization on August 20, 2005 to rule out ischemic cardiomyopathy or a  significant coronary disease. With the first injection into the left  coronary system, there was noted to be left main dissection. The patient  remained hemodynamically stable but there was a clear dissection of the left  main coronary artery and was not felt to be amenable to stenting after  review by Dr. Gala Romney Dr. Riley Kill who felt that the left main and the LAD  were too large to safely deploy a stent.  Emergency cardiothoracic surgery  consultation was obtained and the patient was seen by Dr. Charlett Lango. He agreed that the patient should undergo urgent bypass  grafting to protect the distal circulation in the event of a left main  occlusion or further dissection. He explained the risks, benefits and  alternatives of the procedure to the patient and he agreed to surgery.   HOSPITAL COURSE:  The patient was taken directly to the operating room from  the cath lab and underwent emergency CABG x2 described in detail above. He  tolerated the procedure well and while in the operating room underwent  biopsies of the left ventricle and left atrium. He was taken to the SICU in  stable condition. He was able to be extubated shortly after surgery. He was  hemodynamically stable and doing well on postop day #1. At that time, he was  able to be transferred to the floor. Postoperatively, he has had a mild  volume overload and was started on Lasix for diuresis. He has also been  somewhat hypertensive and has been started on beta blocker and ACE  inhibitor  and the doses have been titrated upward accordingly. Because of a history of  aspirin intolerance, he was started on Plavix. He has otherwise done well.  He has been ambulating in the halls without problem. He has been diuresed  back down to within about seven pounds of his preoperative weight. He has  had a low grade fever which was thought to be secondary to atelectasis and  has been treated with aggressive pulmonary toilet measures. His creatinine  became somewhat elevated at 1.9 from a baseline of 1.7 preoperatively. This  has been monitored very closely and basic metabolic panel will be rechecked  on August 24, 2005. Otherwise, the surgical incision sites are all healing  well. His labs on August 24, 2005 show a hemoglobin of 10.6, hematocrit  31.5, white count 7.2, platelets 125, sodium 36, potassium 4.3, BUN 17,  creatinine 1.9. He  will be evaluated on morning rounds on August 24, 2005  and if he continues to remain stable and no other acute changes have  occurred, he will hopefully be ready for discharge home at that time.  Discharge medications will be followed.   DISCHARGE MEDICATIONS:  1.  Plavix 75 mg q.d.  2.  Coreg 3.125 mg b.i.d.  3.  Lisinopril 10 mg q.d.  4.  Lasix 40 mg q.h.s. x1 week.  5.  K-Dur 20 mEq q.d. x1 week.  6.  Protonix 20 mg q.d.  7.  Folic acid 1 mg q.d.  8.  Tylox 1-2 q.4-6h. p.r.n. for pain.   DISCHARGE INSTRUCTIONS:  He is asked to refrain from driving, heavy lifting  or strenuous activity. He may continue ambulating daily and using his  incentive spirometer. He may shower daily and clean his incisions with soap  and water. He will continue low-fat, low-sodium diet.   DISCHARGE FOLLOWUP:  He will be scheduled to see Dr. Antoine Poche in two weeks  and will have a chest x-ray at that visit. He will see Dr. Dorris Fetch then  the following week and our office will contact him with this appointment. He  is asked to bring his chest x-ray to this appointment for Dr. Dorris Fetch to  review. He will call our office in the interim if he experiences any  problems or has questions.      Coral Ceo, P.A.    ______________________________  Salvatore Decent Dorris Fetch, M.D.    GC/MEDQ  D:  08/23/2005  T:  08/23/2005  Job:  [id number]   cc:   [facility]   Brett Canales A. [name], M.D.  Fax: [phone]

## 2011-03-22 NOTE — Op Note (Signed)
NAME:  Bruce Robertson, MALL NO.:  [id number]   MEDICAL RECORD NO.:  [id number]          PATIENT TYPE:  INP   LOCATION:  2315                         FACILITY:  [facility]   PHYSICIAN:  Sheldon Silvan, M.D.      DATE OF BIRTH:  Jun 01, 1961   DATE OF PROCEDURE:  08/20/2005  DATE OF DISCHARGE:                                 OPERATIVE REPORT   PROCEDURE PERFORMED:  Intraoperative transesophageal echocardiography (TEE).   [name] was brought to the operating room emergently today by Dr.  Dorris Fetch for coronary artery bypass grafting and repair of dissection of  his left main coronary artery.  This had occurred during catheterization  when investigation for cardiomyopathy with an ejection fraction by 2-D echo  of approximately 25% was being investigated.  He was stable.   After successful induction of general anesthesia, the [employer]  OmniPlane transesophageal echo probe was sheathed and lubricated  appropriately.  Dr. Autumn Patty passed the probe through the  oropharynx into the esophagus with some difficulty but was able to  successfully image the heart.   The left ventricle was seen to be thickened and dilated.  There were no  hypokinetic areas noted.  The contractility overall seemed to give an  ejection fraction of less than 30% however.   The mitral valve was imaged and the leaflets appeared to appose normally.  On Color Flow exam in the long axis, there was only trace regurgitation  noted.   The left atrial appendage was not well seen.   The aortic valve was imaged and was tricuspid.  There was no atherosclerosis  noted on the edges of the cusp.  There was only trace regurgitation  centrally on Color Flow exam.  The interatrial septum was examined on  Color  Flow and found to have no PFO.   The right ventricle was normal on exam and the tricuspid valve showed only  trace regurgitation in the long axis with Color Flow.   The aorta was examined and  was found to have only minimal areas of sclerotic  material in the ascending portion.  I was unable to visualize the descending  aorta.   The patient was placed on cardiopulmonary bypass by Dr. Dorris Fetch.  Completion of the coronary artery bypass grafting was performed.   On weaning from bypass, the patient's left ventricle was contracting well  and it appeared to increase its ejection fraction slightly to likely more  than 40%.  The valvular structures were unchanged.  The probe was removed  prior to the patient being transported to the surgical intensive care unit.      Sheldon Silvan, M.D.  Electronically Signed     DC/MEDQ  D:  08/21/2005  T:  08/21/2005  Job:  [id number]

## 2011-03-22 NOTE — Letter (Signed)
January 20, 2007     RE:  KINGSTEN, ENFIELD  MRN:  [id number]  /  DOB:  05/13/1961   To whom it may concern:   Mr. Quadros is under my care for cardiomyopathy.  He has required  evaluation with invasive diagnostic procedure earlier this year.  He has  required frequent office visits and is on several medications.  With  this, he does have some limitations in his functional status.  Further  specific information is available upon request.  If you have any  questions, please call my office at [phone].    Sincerely,      Rollene Rotunda, MD, [facility]  Electronically Signed    JH/MedQ  DD: 01/20/2007  DT: 01/20/2007  Job #: [id number]

## 2011-06-02 ENCOUNTER — Emergency Department (HOSPITAL_COMMUNITY)
Admission: EM | Admit: 2011-06-02 | Discharge: 2011-06-02 | Disposition: A | Discharge status: Home / Self Care | Payer: Self-pay | Attending: Emergency Medicine | Admitting: Emergency Medicine

## 2011-06-02 ENCOUNTER — Emergency Department (HOSPITAL_COMMUNITY): Payer: Self-pay

## 2011-06-02 DIAGNOSIS — I10 Essential (primary) hypertension: Secondary | ICD-10-CM | POA: Insufficient documentation

## 2011-06-02 DIAGNOSIS — I2510 Atherosclerotic heart disease of native coronary artery without angina pectoris: Secondary | ICD-10-CM | POA: Insufficient documentation

## 2011-06-02 DIAGNOSIS — R110 Nausea: Secondary | ICD-10-CM | POA: Insufficient documentation

## 2011-06-02 DIAGNOSIS — M25519 Pain in unspecified shoulder: Secondary | ICD-10-CM | POA: Insufficient documentation

## 2011-06-02 DIAGNOSIS — R61 Generalized hyperhidrosis: Secondary | ICD-10-CM | POA: Insufficient documentation

## 2011-06-02 DIAGNOSIS — R0789 Other chest pain: Secondary | ICD-10-CM | POA: Insufficient documentation

## 2011-06-02 LAB — BASIC METABOLIC PANEL
BUN: 15 mg/dL (ref 6–23)
CO2: 28 mEq/L (ref 19–32)
Calcium: 10.7 mg/dL — ABNORMAL HIGH (ref 8.4–10.5)
Chloride: 100 mEq/L (ref 96–112)
Creatinine, Ser: 1.31 mg/dL (ref 0.50–1.35)
GFR calc Af Amer: >60 mL/min (ref >60)
GFR calc non Af Amer: 58 mL/min — ABNORMAL LOW (ref >60)
Glucose, Bld: 102 mg/dL — ABNORMAL HIGH (ref 70–99)
Potassium: 3.6 mEq/L (ref 3.5–5.1)
Sodium: 137 mEq/L (ref 135–145)

## 2011-06-02 LAB — TROPONIN I
Troponin I: <0.3 ng/mL (ref <0.30)
Troponin I: <0.3 ng/mL (ref <0.30)

## 2011-06-02 LAB — DIFFERENTIAL
Basophils Absolute: 0 10*3/uL (ref 0.0–0.1)
Basophils Relative: 0 % (ref 0–1)
Eosinophils Absolute: 0.1 10*3/uL (ref 0.0–0.7)
Eosinophils Relative: 1 % (ref 0–5)
Lymphocytes Relative: 48 % — ABNORMAL HIGH (ref 12–46)
Lymphs Abs: 3 10*3/uL (ref 0.7–4.0)
Monocytes Absolute: 0.5 10*3/uL (ref 0.1–1.0)
Monocytes Relative: 9 % (ref 3–12)
Neutro Abs: 2.7 10*3/uL (ref 1.7–7.7)
Neutrophils Relative %: 43 % (ref 43–77)

## 2011-06-02 LAB — CK TOTAL AND CKMB (NOT AT ARMC)
CK, MB: 3.7 ng/mL (ref 0.3–4.0)
Relative Index: 0.8 (ref 0.0–2.5)
Total CK: 469 U/L — ABNORMAL HIGH (ref 7–232)

## 2011-06-02 LAB — CBC
HCT: 45.4 % (ref 39.0–52.0)
Hemoglobin: 15.7 g/dL (ref 13.0–17.0)
MCH: 29.1 pg (ref 26.0–34.0)
MCHC: 34.6 g/dL (ref 30.0–36.0)
MCV: 84.2 fL (ref 78.0–100.0)
Platelets: 144 10*3/uL — ABNORMAL LOW (ref 150–400)
RBC: 5.39 MIL/uL (ref 4.22–5.81)
RDW: 14.2 % (ref 11.5–15.5)
WBC: 6.3 10*3/uL (ref 4.0–10.5)

## 2011-06-02 LAB — CK: Total CK: 470 U/L — ABNORMAL HIGH (ref 7–232)

## 2011-06-07 ENCOUNTER — Encounter: Payer: Self-pay | Admitting: Physician Assistant

## 2011-06-10 ENCOUNTER — Encounter: Payer: Self-pay | Admitting: Physician Assistant

## 2011-06-10 ENCOUNTER — Ambulatory Visit (INDEPENDENT_AMBULATORY_CARE_PROVIDER_SITE_OTHER): Payer: Self-pay | Admitting: Physician Assistant

## 2011-06-10 DIAGNOSIS — R5383 Other fatigue: Secondary | ICD-10-CM | POA: Insufficient documentation

## 2011-06-10 DIAGNOSIS — R5381 Other malaise: Secondary | ICD-10-CM

## 2011-06-10 DIAGNOSIS — G4733 Obstructive sleep apnea (adult) (pediatric): Secondary | ICD-10-CM | POA: Insufficient documentation

## 2011-06-10 DIAGNOSIS — G4730 Sleep apnea, unspecified: Secondary | ICD-10-CM

## 2011-06-10 DIAGNOSIS — R079 Chest pain, unspecified: Secondary | ICD-10-CM

## 2011-06-10 LAB — TSH: TSH: 1.1 u[IU]/mL (ref 0.35–5.50)

## 2011-06-10 NOTE — Progress Notes (Signed)
History of Present Illness: Primary Cardiologist:  Dr. Rollene Rotunda  Bruce Robertson is a 50 y.o. male with a h/o NICM, EF 40%, HTN, Hyperlipidemia, GERD and sleep apnea who presents for ED visit follow up.  He underwent cardiac catheterization in 2006 that was complicated by a left main dissection.  He underwent bypass surgery with a vein graft to the LAD and a vein graft to the obtuse marginal.  His last heart catheterization was in 1/08 and demonstrated normal coronary arteries, 2/2 patent bypass grafts and an EF of 50%.  Last echocardiogram 6/10: EF 40%, inferior and anterior hypokinesis.  He went to the emergency room on 7/29 with chest discomfort.  He has had these pains off and on for years since his bypass surgery.  However, the symptoms were worse when he went to the emergency room.  It is a sharp pain in the left chest.  He can bring on with exertion at times.  He's not sure if he felt short of breath with it.  He did no diaphoresis.  He also felt lightheaded.  He denies syncope but actually did fall after getting up suddenly from his bed.  He denies any radiation to his arm or jaw.  He does note dyspnea with exertion.  This is fairly chronic.  He describes class II symptoms.  He sleeps on 2 pillows.  He denies PND or significant pedal edema.  Past Medical History  Diagnosis Date  . Cardiomyopathy     Nonischemic. Ef has been about 45%. S/P CABG.  Marland Kitchen Dyslipidemia   . Tobacco abuse     Remote  . Reflux   . Ulcer     In GI tract  . Colon polyps 2002  . Hyperplastic rectal polyp   . Sleep apnea     Mild    Current Outpatient Prescriptions  Medication Sig Dispense Refill  . atorvastatin (LIPITOR) 40 MG tablet Take 40 mg by mouth daily.        . carvedilol (COREG) 12.5 MG tablet TAKE ONE TABLET BY MOUTH TWICE DAILY  60 tablet  3  . dexlansoprazole (DEXILANT) 60 MG capsule Take 60 mg by mouth daily.        . fish oil-omega-3 fatty acids 1000 MG capsule Take 2 capsules by mouth daily.         . furosemide (LASIX) 40 MG tablet Take 40 mg by mouth daily.        Marland Kitchen lisinopril (PRINIVIL,ZESTRIL) 20 MG tablet Take 20 mg by mouth daily.        . Multiple Vitamin (MULTIVITAMIN) tablet Take 1 tablet by mouth daily.        . potassium chloride SA (K-DUR,KLOR-CON) 20 MEQ tablet Take 20 mEq by mouth as needed.         Allergies: No Known Allergies  Social history:  Remote history of tobacco abuse  ROS:  Please see the history of present illness.  He does note some congestion in his left ear.  No fevers or chills.  He denies any chest pain associated with meals.  He denies significant dysphagia.  His acid reflux symptoms are fairly well controlled with his PPI.  He does note increased fatigue and has a h/o snoring and daytime hypersomnolence.  He was apparently not qualified for CPAP a few years ago when he tested positive for OSA.  All other systems reviewed and negative.   Vital Signs: BP 115/70  Pulse 59  Resp 16  Ht 6\' 1"  (  1.854 m)  Wt 227 lb (102.967 kg)  BMI 29.95 kg/m2  PHYSICAL EXAM: Well nourished, well developed, in no acute distress HEENT: normal; Of note bilateral TMs are clear  Neck: no JVD Endocrine: No thyromegaly Cardiac:  normal S1, S2; RRR; no murmur Lungs:  clear to auscultation bilaterally, no wheezing, rhonchi or rales Abd: soft, nontender, no hepatomegaly Ext: no edema Skin: warm and dry Neuro:  CNs 2-12 intact, no focal abnormalities noted Psych: Normal affect  EKG:  Sinus rhythm, heart rate 63, normal axis, questionable inferior Q waves, T-wave inversions in leads 2, 3, aVF, V5-V6, no significant change when compared to prior tracings  ASSESSMENT AND PLAN:

## 2011-06-10 NOTE — Assessment & Plan Note (Signed)
Atypical symptoms.  However, he does have some symptoms with exertion.  Proceed to stress Myoview to rule out ischemia.

## 2011-06-10 NOTE — Assessment & Plan Note (Signed)
Probably related to sleep apnea.  Check a TSH.

## 2011-06-10 NOTE — Assessment & Plan Note (Signed)
Refer sleep medicine for a followup on sleep apnea.  He may need repeat sleep testing given the increase in his symptoms.

## 2011-06-10 NOTE — Assessment & Plan Note (Signed)
Reassess LV function with Myoview study.  Follow up with Dr. Antoine Poche in 3 or 4 weeks.

## 2011-06-10 NOTE — Patient Instructions (Signed)
Lab today--TSH 780.79  Schedule an appointment for a stress myoview .  Schedule an appointment with the pulmonologist to evaluate you for sleep apnea/CPAP.  Schedule an appointment with Dr Antoine Poche for 3-4 weeks.

## 2011-06-11 ENCOUNTER — Telehealth: Payer: Self-pay | Admitting: Physician Assistant

## 2011-06-11 NOTE — Telephone Encounter (Signed)
Sleep Study faxed to [facility] @  Sleep Center @ [facility] [phone]  06/11/11/km

## 2011-06-12 ENCOUNTER — Institutional Professional Consult (permissible substitution): Payer: Self-pay | Admitting: Pulmonary Disease

## 2011-06-19 ENCOUNTER — Other Ambulatory Visit (HOSPITAL_COMMUNITY): Payer: Self-pay | Admitting: Radiology

## 2011-07-11 ENCOUNTER — Ambulatory Visit: Payer: Self-pay | Admitting: Cardiology

## 2011-07-12 ENCOUNTER — Ambulatory Visit: Payer: Self-pay | Admitting: Cardiology

## 2011-08-01 LAB — DIFFERENTIAL
Basophils Absolute: 0.1
Basophils Relative: 1
Eosinophils Absolute: 0.1
Eosinophils Relative: 1
Lymphocytes Relative: 49 — ABNORMAL HIGH
Lymphs Abs: 2.7
Monocytes Absolute: 0.5
Monocytes Relative: 9
Neutro Abs: 2.3
Neutrophils Relative %: 41 — ABNORMAL LOW

## 2011-08-01 LAB — COMPREHENSIVE METABOLIC PANEL
ALT: 42
AST: 35
Albumin: 3.9
Alkaline Phosphatase: 48
BUN: 17
CO2: 27
Calcium: 9.3
Chloride: 103
Creatinine, Ser: 1.38
GFR calc Af Amer: >60
GFR calc non Af Amer: 56 — ABNORMAL LOW
Glucose, Bld: 144 — ABNORMAL HIGH
Potassium: 3.9
Sodium: 139
Total Bilirubin: 0.8
Total Protein: 6.9

## 2011-08-01 LAB — POCT CARDIAC MARKERS
CKMB, poc: 4.5
Myoglobin, poc: 202
Operator id: 196461
Troponin i, poc: <0.05

## 2011-08-01 LAB — D-DIMER, QUANTITATIVE: D-Dimer, Quant: 0.25

## 2011-08-01 LAB — CBC
HCT: 41.3
Hemoglobin: 14
MCHC: 33.8
MCV: 85.7
Platelets: 163
RBC: 4.82
RDW: 15
WBC: 5.6

## 2011-08-01 LAB — POCT I-STAT, CHEM 8
BUN: 18
Calcium, Ion: 1.15
Chloride: 105
Creatinine, Ser: 1.8 — ABNORMAL HIGH
Glucose, Bld: 93
HCT: 45
Hemoglobin: 15.3
Potassium: 3.6
Sodium: 138
TCO2: 26

## 2011-08-01 LAB — TROPONIN I: Troponin I: 0.03

## 2011-08-01 LAB — CARDIAC PANEL(CRET KIN+CKTOT+MB+TROPI)
CK, MB: 3.7
Relative Index: 0.6
Total CK: 659 — ABNORMAL HIGH
Troponin I: 0.02

## 2011-08-01 LAB — CK TOTAL AND CKMB (NOT AT ARMC)
CK, MB: 4.1 — ABNORMAL HIGH
Relative Index: 0.6
Total CK: 701 — ABNORMAL HIGH

## 2011-08-08 ENCOUNTER — Encounter: Payer: Self-pay | Admitting: Cardiology

## 2011-11-27 ENCOUNTER — Telehealth: Payer: Self-pay | Admitting: Physician Assistant

## 2011-11-27 NOTE — Telephone Encounter (Signed)
Pt Signed ROI, Picked Up copy of Stress  11/27/11/KM

## 2011-11-28 ENCOUNTER — Telehealth: Payer: Self-pay | Admitting: Cardiology

## 2011-11-28 DIAGNOSIS — R0600 Dyspnea, unspecified: Secondary | ICD-10-CM

## 2011-11-28 DIAGNOSIS — I2510 Atherosclerotic heart disease of native coronary artery without angina pectoris: Secondary | ICD-10-CM

## 2011-11-28 NOTE — Telephone Encounter (Signed)
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  One was ordered for him in August however he cancelled it.  Testing will be rescheduled.

## 2011-11-28 NOTE — Telephone Encounter (Signed)
New problem Pt said he is trying to get dot physical and get stress test in next two or three days for a job please call him back

## 2011-11-29 ENCOUNTER — Encounter: Payer: Self-pay | Admitting: *Deleted

## 2011-12-03 ENCOUNTER — Ambulatory Visit (HOSPITAL_COMMUNITY): Payer: BC Managed Care – PPO | Attending: Cardiology | Admitting: Radiology

## 2011-12-03 DIAGNOSIS — Z951 Presence of aortocoronary bypass graft: Secondary | ICD-10-CM | POA: Insufficient documentation

## 2011-12-03 DIAGNOSIS — E785 Hyperlipidemia, unspecified: Secondary | ICD-10-CM | POA: Insufficient documentation

## 2011-12-03 DIAGNOSIS — I4949 Other premature depolarization: Secondary | ICD-10-CM

## 2011-12-03 DIAGNOSIS — R079 Chest pain, unspecified: Secondary | ICD-10-CM | POA: Insufficient documentation

## 2011-12-03 DIAGNOSIS — I10 Essential (primary) hypertension: Secondary | ICD-10-CM | POA: Insufficient documentation

## 2011-12-03 DIAGNOSIS — I2510 Atherosclerotic heart disease of native coronary artery without angina pectoris: Secondary | ICD-10-CM

## 2011-12-03 DIAGNOSIS — R0600 Dyspnea, unspecified: Secondary | ICD-10-CM

## 2011-12-03 DIAGNOSIS — R002 Palpitations: Secondary | ICD-10-CM | POA: Insufficient documentation

## 2011-12-03 DIAGNOSIS — R42 Dizziness and giddiness: Secondary | ICD-10-CM | POA: Insufficient documentation

## 2011-12-03 MED ORDER — TECHNETIUM TC 99M TETROFOSMIN IV KIT
33.0000 | PACK | Freq: Once | INTRAVENOUS | Status: AC | PRN
  Administered 2011-12-03: 33 via INTRAVENOUS

## 2011-12-03 MED ORDER — TECHNETIUM TC 99M TETROFOSMIN IV KIT
11.0000 | PACK | Freq: Once | INTRAVENOUS | Status: AC | PRN
  Administered 2011-12-03: 11 via INTRAVENOUS

## 2011-12-03 NOTE — Progress Notes (Signed)
[facility] SITE 3 NUCLEAR MED [address] [place] [address] [phone]  Cardiology Nuclear Med Study  Bruce Robertson is a 51 y.o. male [id number] 1961/03/02   Nuclear Med Background Indication for Stress Test:  Evaluation for Ischemia History: '06 CABGx2, '08 Heart Cath (L) main Dissection -CABG-patent grafts EF 50%, '08 MPS EF:36% scar @ base of inferior wall (old), '10 ECHO: EF: 40% inf/ant hypokinesis Cardiac Risk Factors: Hypertension and Lipids  Symptoms:  Chest Pain, Light-Headedness and Palpitations   Nuclear Pre-Procedure Caffeine/Decaff Intake:  None NPO After: 10:00pm   Lungs: clear IV 0.9% NS with Angio Cath:  20g  IV Site: R Antecubital  IV Started by:  Stanton Kidney, EMT-P  Chest Size (in):  48 Cup Size: n/a  Height: 6\' 1"  (1.854 m)  Weight:  235 lb (106.595 kg)  BMI:  Body mass index is 31.00 kg/(m^2). Tech Comments:  Coreg held > 24 hours, per patient.    Nuclear Med Study 1 or 2 day study: 1 day  Stress Test Type:  Stress  Reading MD: Olga Millers, MD  Order Authorizing Provider:  J.Hochrein  Resting Radionuclide: Technetium 25m Tetrofosmin  Resting Radionuclide Dose: 11.0 mCi   Stress Radionuclide:  Technetium 72m Tetrofosmin  Stress Radionuclide Dose: 33.0 mCi           Stress Protocol Rest HR: 48 Stress HR: 157  Rest BP: 94/53 Stress BP: 157/81  Exercise Time (min): 11:00 METS: 13.40   Predicted Max HR: 172 bpm % Max HR: 91.28 bpm Rate Pressure Product: 21308   Dose of Adenosine (mg):  n/a Dose of Lexiscan: n/a mg  Dose of Atropine (mg): n/a Dose of Dobutamine: n/a mcg/kg/min (at max HR)  Stress Test Technologist: Milana Na, EMT-P  Nuclear Technologist:  Domenic Polite, CNMT     Rest Procedure:  Myocardial perfusion imaging was performed at rest 45 minutes following the intravenous administration of Technetium 46m Tetrofosmin. Rest ECG: Sinus Bradycardia  Stress Procedure:  The patient exercised for 11:00.   The patient stopped due to fatigue and denied any chest pain.  There were no significant ST-T wave changes and occ pvcs.  Technetium 69m Tetrofosmin was injected at peak exercise and myocardial perfusion imaging was performed after a brief delay. Stress ECG: No significant ST segment change suggestive of ischemia.  QPS Raw Data Images:  Acquisition technically good; severe LVE. Stress Images:  There is decreased uptake in the anterolateral and inferior walls. Rest Images:  There is decreased uptake in the anterolateral and inferior walls, less prominent compared to the stress images. Subtraction (SDS):  These findings are consistent with prior anterolateral and inferior infarct; mild peri-infarct ischemia in the anterolateral wall. Transient Ischemic Dilatation (Normal <1.22):  1.01 Lung/Heart Ratio (Normal <0.45):  0.24  Quantitative Gated Spect Images QGS EDV:  248 ml QGS ESV:  164 ml QGS cine images:  Global hypokinesis; anterior akinesis. QGS EF: 34%  Impression Exercise Capacity:  Good exercise capacity. BP Response:  Normal blood pressure response. Clinical Symptoms:  No chest pain. ECG Impression:  No significant ST segment change suggestive of ischemia. Comparison with Prior Nuclear Study: Anterior ischemia less prominent compared to previous and lateral defect more prominent.  Overall Impression:  Abnormal stress nuclear study with large defects in the anterolateral and inferior walls consistent with prior infarct; mild reversibility in the anterolateral wall consistent with very mild peri-infarct ischemia.  Olga Millers

## 2011-12-05 ENCOUNTER — Telehealth: Payer: Self-pay | Admitting: Physician Assistant

## 2011-12-05 ENCOUNTER — Telehealth: Payer: Self-pay | Admitting: *Deleted

## 2011-12-05 NOTE — Progress Notes (Signed)
duplicate

## 2011-12-05 NOTE — Telephone Encounter (Signed)
LOV,Echo Mailed to PT, ROI Signed  12/05/11/KM

## 2011-12-05 NOTE — Telephone Encounter (Signed)
lmom ptcb to discuss setting up echo per Dr. Antoine Poche and Tereso Newcomer, PA-C, work # and mobile are not valid for pt. Danielle Rankin

## 2011-12-06 ENCOUNTER — Telehealth: Payer: Self-pay | Admitting: Cardiology

## 2011-12-06 ENCOUNTER — Other Ambulatory Visit: Payer: Self-pay | Admitting: Physician Assistant

## 2011-12-06 DIAGNOSIS — I509 Heart failure, unspecified: Secondary | ICD-10-CM

## 2011-12-06 NOTE — Telephone Encounter (Signed)
Mr Liwanag has completed two ROI forms this week for release of information, although signed he did not designate anyone to pick up his records in his absence. 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 He arranged for a friend Clayborn Bigness to pick up a copy of his record to take to his employer. Due to the inability to obtain a completed designated party form I approved the verbal request after verifying his demographics and making a copy of Ms. Bey's [place] drivers license. rmf

## 2011-12-06 NOTE — Telephone Encounter (Signed)
Fu call °Patient returning your call °

## 2011-12-07 ENCOUNTER — Other Ambulatory Visit: Payer: Self-pay | Admitting: Cardiology

## 2011-12-09 ENCOUNTER — Other Ambulatory Visit: Payer: Self-pay

## 2011-12-09 MED ORDER — ATORVASTATIN CALCIUM 40 MG PO TABS: 40.0000 mg | ORAL_TABLET | Freq: Every day | ORAL | Status: DC

## 2011-12-11 ENCOUNTER — Other Ambulatory Visit (HOSPITAL_COMMUNITY): Payer: BC Managed Care – PPO | Admitting: Radiology

## 2011-12-13 ENCOUNTER — Ambulatory Visit (HOSPITAL_COMMUNITY): Payer: BC Managed Care – PPO | Attending: Cardiology | Admitting: Radiology

## 2011-12-13 DIAGNOSIS — I10 Essential (primary) hypertension: Secondary | ICD-10-CM | POA: Insufficient documentation

## 2011-12-13 DIAGNOSIS — R072 Precordial pain: Secondary | ICD-10-CM

## 2011-12-13 DIAGNOSIS — I509 Heart failure, unspecified: Secondary | ICD-10-CM | POA: Insufficient documentation

## 2011-12-13 DIAGNOSIS — R079 Chest pain, unspecified: Secondary | ICD-10-CM | POA: Insufficient documentation

## 2011-12-13 DIAGNOSIS — E785 Hyperlipidemia, unspecified: Secondary | ICD-10-CM | POA: Insufficient documentation

## 2013-01-26 ENCOUNTER — Telehealth: Payer: Self-pay | Admitting: Cardiology

## 2013-01-26 DIAGNOSIS — Z0289 Encounter for other administrative examinations: Secondary | ICD-10-CM

## 2013-01-26 DIAGNOSIS — I428 Other cardiomyopathies: Secondary | ICD-10-CM

## 2013-01-26 NOTE — Telephone Encounter (Signed)
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 Pt would like to talk to you concerning this matter.

## 2013-01-27 NOTE — Telephone Encounter (Signed)
Its time for pt to have another DOT physical and last year he had a nuc study.  Need to review with Dr Antoine Poche as he has not seen the pt in some time and to verify if this is the type study pt needs.  Pt aware I will call back once reviewed and he will be scheduled for testing as instructed

## 2013-01-29 NOTE — Telephone Encounter (Signed)
OK to set up with me to do an ETT.

## 2013-01-29 NOTE — Telephone Encounter (Signed)
Left message for pt to expect a call from the scheduling department for his appt to be scheduled.  Reviewed instructions over phone and asked him to call if questions and if he doesn't hear from scheduling

## 2013-02-01 ENCOUNTER — Telehealth: Payer: Self-pay | Admitting: Cardiology

## 2013-02-01 NOTE — Telephone Encounter (Signed)
New Problem:    Patient called in wanting to know if there was another office he could be referred to, to have his GXT performed at because waiting until 02/23/13 would bee too long.  All of the patient's qualifications would expire by that time.  Please call back.

## 2013-02-01 NOTE — Telephone Encounter (Signed)
Spoke to patient he stated he needed treadmill done before 02/23/13.Stated this is for his DOT physical and needs done as soon as possible.Schedulers will call back tomorrow 02/02/13 to schedule.

## 2013-02-01 NOTE — Telephone Encounter (Signed)
Pt called regarding the stress test he needs to have done. According to our records pt is to have  a stress test. Omar Person scheduled aware and will call pt today to set up the appointment. Pt aware.

## 2013-02-01 NOTE — Telephone Encounter (Signed)
New problem    Per pt returning a call (from pam 01/29/13) regarding an appt

## 2013-02-04 ENCOUNTER — Ambulatory Visit (INDEPENDENT_AMBULATORY_CARE_PROVIDER_SITE_OTHER): Payer: Self-pay | Admitting: Physician Assistant

## 2013-02-04 ENCOUNTER — Other Ambulatory Visit (HOSPITAL_COMMUNITY): Payer: Self-pay | Admitting: Radiology

## 2013-02-04 ENCOUNTER — Ambulatory Visit (HOSPITAL_COMMUNITY): Payer: Self-pay | Attending: Cardiology | Admitting: Radiology

## 2013-02-04 DIAGNOSIS — I428 Other cardiomyopathies: Secondary | ICD-10-CM | POA: Insufficient documentation

## 2013-02-04 DIAGNOSIS — Z0289 Encounter for other administrative examinations: Secondary | ICD-10-CM

## 2013-02-04 DIAGNOSIS — R9439 Abnormal result of other cardiovascular function study: Secondary | ICD-10-CM

## 2013-02-04 DIAGNOSIS — R079 Chest pain, unspecified: Secondary | ICD-10-CM

## 2013-02-04 LAB — BASIC METABOLIC PANEL
BUN: 16 mg/dL (ref 6–23)
CO2: 26 mEq/L (ref 19–32)
Calcium: 8.9 mg/dL (ref 8.4–10.5)
Chloride: 102 mEq/L (ref 96–112)
Creatinine, Ser: 1.5 mg/dL (ref 0.4–1.5)
GFR: 65.7 mL/min (ref >60.00)
Glucose, Bld: 109 mg/dL — ABNORMAL HIGH (ref 70–99)
Potassium: 3.7 mEq/L (ref 3.5–5.1)
Sodium: 135 mEq/L (ref 135–145)

## 2013-02-04 NOTE — Patient Instructions (Addendum)
PLEASE FOLLOW UP WITH DR. HOCHREIN AT 1ST AVAILABLE PER DR. HOCHREIN AND SCOTT WEAVER, PAC,  LABS TODAY; BMET  Your physician has requested that you have an echocardiogram DX CARDIOMYOPATHY. Echocardiography is a painless test that uses sound waves to create images of your heart. It provides your doctor with information about the size and shape of your heart and how well your heart's chambers and valves are working. This procedure takes approximately one hour. There are no restrictions for this procedure.

## 2013-02-04 NOTE — Procedures (Signed)
Exercise Treadmill Test  Pre-Exercise Testing Evaluation Rhythm: normal sinus  Rate: 57     Test  Exercise Tolerance Test Ordering MD: Angelina Sheriff, MD  Interpreting MD: Tereso Newcomer PA-C  Unique Test No: 1  Treadmill:  1  Indication for ETT: chest pain - rule out ischemia  Contraindication to ETT: No   Stress Modality: exercise - treadmill  Cardiac Imaging Performed: non   Protocol: standard Bruce - maximal  Max BP:  202/79  Max MPHR (bpm):  170 85% MPR (bpm):  145  MPHR obtained (bpm):  153 % MPHR obtained:  90%  Reached 85% MPHR (min:sec):  7:36 Total Exercise Time (min-sec):  8:59  Workload in METS:  10.1 Borg Scale: 16  Reason ETT Terminated:  desired heart rate attained    ST Segment Analysis At Rest: normal ST segments - no evidence of significant ST depression With Exercise: no evidence of significant ST depression  Other Information Arrhythmia:  Yes Angina during ETT:  absent (0) Quality of ETT:  diagnostic  ETT Interpretation:  normal - no evidence of ischemia by ST analysis  Comments: Good exercise tolerance. No chest pain. Normal BP response to exercise. No ST-T changes to suggest ischemia.  Patient did have frequent multifocal PVCs and one 4 beat run of NSVT during exercise. Patient did have good HR recovery in 1st minute post exercise.  Recommendations: Patient has been off of all CHF medications for 1 year.  BP is controlled.  HR in 50s at rest. Reviewed ETT with Dr. Rollene Rotunda. Given NSVT, will arrange Echo to re-assess LVF and f/u with Dr. Antoine Poche prior to clearing for DOT physical. Will also check a BMET today to check K+ given PVCs/NSVT. Signed,  Tereso Newcomer, PA-C  10:13 AM 02/04/2013

## 2013-02-04 NOTE — Progress Notes (Signed)
Echocardiogram performed.  

## 2013-02-05 ENCOUNTER — Encounter: Payer: Self-pay | Admitting: Physician Assistant

## 2013-02-05 ENCOUNTER — Telehealth: Payer: Self-pay | Admitting: *Deleted

## 2013-02-05 NOTE — Telephone Encounter (Signed)
lmom labs ok, no changes to be made 

## 2013-02-05 NOTE — Telephone Encounter (Signed)
Message copied by Tarri Fuller on Fri Feb 05, 2013 12:23 PM ------      Message from: [place], [place] T      Created: Thu Feb 04, 2013  4:44 PM       Potassium and kidney function look ok.      Continue with current treatment plan.      Tereso Newcomer, PA-C  4:49 PM 09/17/2012 ------

## 2013-02-08 ENCOUNTER — Encounter: Payer: Self-pay | Admitting: Cardiology

## 2013-02-08 ENCOUNTER — Ambulatory Visit (INDEPENDENT_AMBULATORY_CARE_PROVIDER_SITE_OTHER): Payer: Self-pay | Admitting: Cardiology

## 2013-02-08 VITALS — BP 126/80 | HR 60 | Ht 73.0 in | Wt 224.0 lb

## 2013-02-08 DIAGNOSIS — G4730 Sleep apnea, unspecified: Secondary | ICD-10-CM

## 2013-02-08 DIAGNOSIS — R079 Chest pain, unspecified: Secondary | ICD-10-CM

## 2013-02-08 DIAGNOSIS — I509 Heart failure, unspecified: Secondary | ICD-10-CM

## 2013-02-08 DIAGNOSIS — I428 Other cardiomyopathies: Secondary | ICD-10-CM

## 2013-02-08 DIAGNOSIS — F17200 Nicotine dependence, unspecified, uncomplicated: Secondary | ICD-10-CM

## 2013-02-08 MED ORDER — LISINOPRIL 10 MG PO TABS: 10.0000 mg | ORAL_TABLET | Freq: Every day | ORAL | Status: DC

## 2013-02-08 MED ORDER — CARVEDILOL 3.125 MG PO TABS: 3.1250 mg | ORAL_TABLET | Freq: Two times a day (BID) | ORAL | Status: DC

## 2013-02-08 NOTE — Progress Notes (Signed)
HPI The patient presents for followup of his cardiomyopathy. Years ago he had an ejection fraction of 40%. 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 He did end up requiring bypass because during his diagnostic catheterization he had dissection of his left main vessel. His ejection fraction eventually improved to about 45% with medications. The etiology was thought probably to be hypertensive.  The patient came back for an exercise treadmill for a DOT physical recently. However, he had significant ventricular ectopy during the study. He subsequently was sent for an echocardiogram which demonstrates his EF now to be about 25%. He denies any symptoms however. He has no shortness of breath, PND or orthopnea. He has no palpitations, presyncope or syncope. He has no weight gain or edema. He exercises routinely. With this he reports no limitations.  No Known Allergies  Current Outpatient Prescriptions  Medication Sig Dispense Refill  . atorvastatin (LIPITOR) 40 MG tablet Take 1 tablet (40 mg total) by mouth daily.  30 tablet  0  . carvedilol (COREG) 12.5 MG tablet TAKE ONE TABLET BY MOUTH TWICE DAILY  30 tablet  6  . dexlansoprazole (DEXILANT) 60 MG capsule Take 60 mg by mouth daily.        . fish oil-omega-3 fatty acids 1000 MG capsule Take 2 capsules by mouth daily.        . furosemide (LASIX) 40 MG tablet TAKE ONE TABLET BY MOUTH EVERY DAY  30 tablet  6  . lisinopril (PRINIVIL,ZESTRIL) 20 MG tablet TAKE ONE TABLET BY MOUTH EVERY DAY  30 tablet  6  . Multiple Vitamin (MULTIVITAMIN) tablet Take 1 tablet by mouth daily.        . potassium chloride SA (K-DUR,KLOR-CON) 20 MEQ tablet Take 20 mEq by mouth as needed.        No current facility-administered medications for this visit.    Past Medical History  Diagnosis Date  . Cardiomyopathy     Nonischemic. EF has been about 45%. S/P CABG.;  b.  Echo 4/14: EF 25%, global HK with inf and mid apical AK, restrictive physiology with E/e' > 15 (elevated LV filling  pressure), trivial AI/MR, mild to mod LAE, mild RVE, mild reduced RVSF, mild RAE, mod TR, PASP 74 (severe pulmonary HTN)   . Dyslipidemia   . Tobacco abuse     Remote  . Reflux   . Ulcer     In GI tract  . Colon polyps 2002  . Hyperplastic rectal polyp   . Sleep apnea     Mild    Past Surgical History  Procedure Laterality Date  . Coronary artery bypass graft      Had a left main dissection after catheterization. Underwent a saphenous vein graft to the LAD and a saphenous vein graft to obtuse marginal.  . Cardiac catheterization    . Leg tendon surgery      Right patellar    ROS:  As stated in the HPI and negative for all other systems.  PHYSICAL EXAM BP 126/80  Pulse 60  Ht 6\' 1"  (1.854 m)  Wt 224 lb (101.606 kg)  BMI 29.56 kg/m2 GENERAL:  Well appearing HEENT:  Pupils equal round and reactive, fundi not visualized, oral mucosa unremarkable NECK:  No jugular venous distention, waveform within normal limits, carotid upstroke brisk and symmetric, no bruits, no thyromegaly LYMPHATICS:  No cervical, inguinal adenopathy LUNGS:  Clear to auscultation bilaterally BACK:  No CVA tenderness CHEST:  Unremarkable HEART:  PMI not displaced or sustained,S1  and S2 within normal limits, no S3, no S4, no clicks, no rubs, no murmurs ABD:  Flat, positive bowel sounds normal in frequency in pitch, no bruits, no rebound, no guarding, no midline pulsatile mass, no hepatomegaly, no splenomegaly EXT:  2 plus pulses throughout, no edema, no cyanosis no clubbing SKIN:  No rashes no nodules NEURO:  Cranial nerves II through XII grossly intact, motor grossly intact throughout PSYCH:  Cognitively intact, oriented to person place and time   ASSESSMENT AND PLAN:  CARDIOMYOPATHY:  Unfortunately his ejection fraction has fallen since he came off of his medications many months ago on his own. I will begin to titrate his medications by adding carvedilol 3.125 mg twice daily and lisinopril 10 mg daily.  He will come back in 2 weeks for med titration. I will also check a basic metabolic profile at that time along with an ACE level and kappa light chain.  Eventually I will repeat an echocardiogram and consider further ischemia workup though I don't think active ischemia is at all contributing to his reduced ejection fraction. His initial etiology was nonischemic.  CABG:  As above.  HTN:  His blood pressure is actually not elevated. It will be managed in the context of treating his cardiomyopathy.

## 2013-02-08 NOTE — Patient Instructions (Addendum)
Please restart Carvedilol at 3.125 mg one twice a day.  Start Lisinopril 10 mg a day.  Please have blood work in 2 weeks.  Please see Tereso Newcomer, PA in 2 weeks.

## 2013-02-22 ENCOUNTER — Encounter: Payer: Self-pay | Admitting: *Deleted

## 2013-02-23 ENCOUNTER — Encounter: Payer: Self-pay | Admitting: Physician Assistant

## 2013-02-23 ENCOUNTER — Ambulatory Visit (INDEPENDENT_AMBULATORY_CARE_PROVIDER_SITE_OTHER): Payer: Self-pay | Admitting: Physician Assistant

## 2013-02-23 ENCOUNTER — Encounter: Payer: BC Managed Care – PPO | Admitting: Physician Assistant

## 2013-02-23 ENCOUNTER — Telehealth: Payer: Self-pay | Admitting: Physician Assistant

## 2013-02-23 ENCOUNTER — Other Ambulatory Visit (INDEPENDENT_AMBULATORY_CARE_PROVIDER_SITE_OTHER): Payer: Self-pay

## 2013-02-23 VITALS — BP 114/72 | HR 58 | Ht 73.0 in | Wt 228.0 lb

## 2013-02-23 DIAGNOSIS — R0989 Other specified symptoms and signs involving the circulatory and respiratory systems: Secondary | ICD-10-CM

## 2013-02-23 DIAGNOSIS — I428 Other cardiomyopathies: Secondary | ICD-10-CM

## 2013-02-23 DIAGNOSIS — I509 Heart failure, unspecified: Secondary | ICD-10-CM

## 2013-02-23 LAB — BASIC METABOLIC PANEL
BUN: 19 mg/dL (ref 6–23)
CO2: 30 mEq/L (ref 19–32)
Calcium: 9.3 mg/dL (ref 8.4–10.5)
Chloride: 105 mEq/L (ref 96–112)
Creatinine, Ser: 1.5 mg/dL (ref 0.4–1.5)
GFR: 63.19 mL/min (ref >60.00)
Glucose, Bld: 95 mg/dL (ref 70–99)
Potassium: 3.7 mEq/L (ref 3.5–5.1)
Sodium: 140 mEq/L (ref 135–145)

## 2013-02-23 MED ORDER — LISINOPRIL 10 MG PO TABS: 10.0000 mg | ORAL_TABLET | Freq: Two times a day (BID) | ORAL | Status: DC

## 2013-02-23 NOTE — Patient Instructions (Addendum)
INCREASE LISINOPRIL TO 10 MG TWICE DAILY, REFILL SENT IN TODAY  LABS TODAY; BMET, ACE LEVEL, KAPPA LIGHT CHAINS  REPEAT BMET IN 2 WEEKS  PLEASE FOLLOW UP WITH SCOTT WEAVER, PAC IN 2 WEEKS   PLEASE FOLLOW UP WITH HOCHREIN  8 WEEKS

## 2013-02-23 NOTE — Progress Notes (Signed)
[address], [address] [place], [place]  [address] Phone: [phone] Fax:  [phone]  Date:  02/23/2013   ID:  Bruce Robertson, DOB 1961-01-19, MRN [id number]  PCP:  No primary provider on file.  Primary Cardiologist:  Dr. Rollene Rotunda     History of Present Illness: Bruce Robertson is a 52 y.o. male who returns for follow up on CHF.  He has a h/o NICM, EF 40%, HTN, HL, GERD and sleep apnea. He underwent cardiac catheterization in 2006 that was complicated by a left main dissection. He underwent bypass surgery with a vein graft to the LAD and a vein graft to the obtuse marginal. His last heart catheterization was in 1/08 and demonstrated normal coronary arteries, 2/2 patent bypass grafts and an EF of 50%. Echocardiogram in 6/10: EF 40%, inferior and anterior hypokinesis.  Myoview 11/2011: EF 34%, AL and inf scar, very mild peri-infarct ischemia in AL wall.  He recently underwent an ETT for DOT physical that demonstrated significant ectopy.  Of note, he had stopped all of his medications about 1 year prior.  Echo was done that demonstrated worsening LVF.  Echo 02/04/13: EF 25%, inferior HK, mid to apical posterior AK, increased LVEDP (at least 20 mmHg), trivial AI, trivial MR, mild to moderate LAE, mild RVE, mildly reduced RVSF, mild RAE, PASP 74 (severe pulmonary hypertension).  He saw Dr. Antoine Poche back in followup recently he was placed back on his beta blocker and ACE inhibitor. He is brought back today for further medication titration.  Tolerating medications well.  The patient denies chest pain, shortness of breath, syncope, orthopnea, PND or significant pedal edema.  He is NYHA Class II.    Labs (4/14): K 3.7, creatinine 1.5  Wt Readings from Last 3 Encounters:  02/23/13 228 lb (103.42 kg)  02/08/13 224 lb (101.606 kg)  12/03/11 235 lb (106.595 kg)     Past Medical History  Diagnosis Date  . Cardiomyopathy     Nonischemic. EF has been about 45%. S/P CABG.;  b.  Echo  4/14: EF 25%, global HK with inf and mid apical AK, restrictive physiology with E/e' > 15 (elevated LV filling pressure), trivial AI/MR, mild to mod LAE, mild RVE, mild reduced RVSF, mild RAE, mod TR, PASP 74 (severe pulmonary HTN)   . Dyslipidemia   . Tobacco abuse     Remote  . Reflux   . Ulcer     In GI tract  . Colon polyps 2002  . Hyperplastic rectal polyp   . Sleep apnea     Mild    Current Outpatient Prescriptions  Medication Sig Dispense Refill  . carvedilol (COREG) 3.125 MG tablet Take 1 tablet (3.125 mg total) by mouth 2 (two) times daily.  60 tablet  3  . lisinopril (PRINIVIL,ZESTRIL) 10 MG tablet Take 1 tablet (10 mg total) by mouth daily.  30 tablet  3  . Multiple Vitamin (MULTIVITAMIN) tablet Take 1 tablet by mouth daily.         No current facility-administered medications for this visit.    Allergies:   No Known Allergies  Social History:  The patient  reports that he quit smoking about 8 years ago. He does not have any smokeless tobacco history on file. He reports that he drinks about 7.0 ounces of alcohol per week. He reports that he does not use illicit drugs.   ROS:  Please see the history of present illness.     All other systems reviewed  and negative.   PHYSICAL EXAM: VS:  BP 114/72  Pulse 58  Ht 6\' 1"  (1.854 m)  Wt 228 lb (103.42 kg)  BMI 30.09 kg/m2 Well nourished, well developed, in no acute distress HEENT: normal Neck: no JVD Cardiac:  normal S1, S2; RRR; no murmur Lungs:  clear to auscultation bilaterally, no wheezing, rhonchi or rales Abd: soft, nontender, no hepatomegaly Ext: no edema Skin: warm and dry Neuro:  CNs 2-12 intact, no focal abnormalities noted  EKG:  Sinus bradycardia, HR 58, nonspecific ST-T wave changes, no change from prior tracing     ASSESSMENT AND PLAN:  1. Cardiomyopathy:  Volume is stable.  He is NYHA Class II.  He is tolerating his medications.  I will increase Lisinopril to 10 mg BID.  He was previously on Coreg 12.5  mg bid.  Not sure he can tolerate that dose again given his bradycardia.  Could consider adding spironolactone vs increasing coreg at follow up.   2. Hypertension:  Controlled. 3. Disposition:  Follow up with me in 2 weeks.  Arrange f/u with Dr. Rollene Rotunda in 8 weeks.   Luna Glasgow, PA-C  12:20 PM 02/23/2013

## 2013-02-23 NOTE — Telephone Encounter (Signed)
New problem    Pt wants to know if he can be approved for short term disability

## 2013-02-23 NOTE — Telephone Encounter (Signed)
lmom per Scott W. PA pt needs to get paperwork from employer and bring for Dr. Hochrein to fill out. 

## 2013-02-23 NOTE — Telephone Encounter (Signed)
lmom per Bruce Robertson. PA pt needs to get paperwork from employer and bring for Dr. Antoine Poche to fill out.

## 2013-02-24 ENCOUNTER — Telehealth: Payer: Self-pay | Admitting: *Deleted

## 2013-02-24 LAB — KAPPA/LAMBDA LIGHT CHAINS
Kappa free light chain: 1.45 mg/dL (ref 0.33–1.94)
Kappa:Lambda Ratio: 0.81 (ref 0.26–1.65)
Lambda Free Lght Chn: 1.79 mg/dL (ref 0.57–2.63)

## 2013-02-24 LAB — ANGIOTENSIN CONVERTING ENZYME: Angiotensin-Converting Enzyme: 6 U/L — ABNORMAL LOW (ref 8–52)

## 2013-02-24 NOTE — Telephone Encounter (Signed)
Message copied by Tarri Fuller on Wed Feb 24, 2013 10:26 AM ------      Message from: [place], [place] T      Created: Tue Feb 23, 2013  5:23 PM       Potassium and kidney function look good.      Continue with current treatment plan.      Tereso Newcomer, PA-C  4:49 PM 09/17/2012 ------

## 2013-02-24 NOTE — Telephone Encounter (Signed)
pt notified about lab results and with verbal understanding. I asked pt if he got my message last night about the short term disability paperwork from employer, he said yes and thank you

## 2013-03-09 ENCOUNTER — Other Ambulatory Visit: Payer: Self-pay

## 2013-03-09 ENCOUNTER — Ambulatory Visit: Payer: Self-pay | Admitting: Physician Assistant

## 2013-03-25 ENCOUNTER — Ambulatory Visit (INDEPENDENT_AMBULATORY_CARE_PROVIDER_SITE_OTHER): Payer: Self-pay | Admitting: Physician Assistant

## 2013-03-25 ENCOUNTER — Other Ambulatory Visit (INDEPENDENT_AMBULATORY_CARE_PROVIDER_SITE_OTHER): Payer: Self-pay

## 2013-03-25 ENCOUNTER — Encounter: Payer: Self-pay | Admitting: Physician Assistant

## 2013-03-25 VITALS — BP 118/70 | HR 65 | Ht 73.0 in | Wt 230.8 lb

## 2013-03-25 DIAGNOSIS — R0602 Shortness of breath: Secondary | ICD-10-CM

## 2013-03-25 DIAGNOSIS — R079 Chest pain, unspecified: Secondary | ICD-10-CM

## 2013-03-25 DIAGNOSIS — I428 Other cardiomyopathies: Secondary | ICD-10-CM

## 2013-03-25 DIAGNOSIS — I509 Heart failure, unspecified: Secondary | ICD-10-CM

## 2013-03-25 DIAGNOSIS — I10 Essential (primary) hypertension: Secondary | ICD-10-CM

## 2013-03-25 LAB — BASIC METABOLIC PANEL
BUN: 21 mg/dL (ref 6–23)
CO2: 29 mEq/L (ref 19–32)
Calcium: 9 mg/dL (ref 8.4–10.5)
Chloride: 105 mEq/L (ref 96–112)
Creatinine, Ser: 1.4 mg/dL (ref 0.4–1.5)
GFR: 66.72 mL/min (ref >60.00)
Glucose, Bld: 81 mg/dL (ref 70–99)
Potassium: 4 mEq/L (ref 3.5–5.1)
Sodium: 139 mEq/L (ref 135–145)

## 2013-03-25 LAB — BRAIN NATRIURETIC PEPTIDE: Pro B Natriuretic peptide (BNP): 152 pg/mL — ABNORMAL HIGH (ref 0.0–100.0)

## 2013-03-25 MED ORDER — CARVEDILOL 6.25 MG PO TABS: 6.2500 mg | ORAL_TABLET | Freq: Two times a day (BID) | ORAL | Status: DC

## 2013-03-25 NOTE — Progress Notes (Signed)
[address], [address] [place], [place]  [address] Phone: [phone] Fax:  [phone]  Date:  03/25/2013   ID:  Bruce Robertson, DOB 1961/08/29, MRN [id number]  PCP:  No primary provider on file.  Primary Cardiologist:  Dr. Rollene Rotunda     History of Present Illness: Bruce Robertson is a 52 y.o. male who returns for follow up on CHF.  He has a h/o NICM, EF 40%, HTN, HL, GERD and sleep apnea. He underwent cardiac catheterization in 2006 that was complicated by a left main dissection. He underwent bypass surgery with a vein graft to the LAD and a vein graft to the obtuse marginal. His last heart catheterization was in 1/08 and demonstrated normal coronary arteries, 2/2 patent bypass grafts and an EF of 50%. Echocardiogram in 6/10: EF 40%, inferior and anterior hypokinesis.  Myoview 11/2011: EF 34%, AL and inf scar, very mild peri-infarct ischemia in AL wall.  He recently underwent an ETT for DOT physical that demonstrated significant ectopy.  Of note, he had stopped all of his medications about 1 year prior.  Echo was done that demonstrated worsening LVF.  Echo 02/04/13: EF 25%, inferior HK, mid to apical posterior AK, increased LVEDP (at least 20 mmHg), trivial AI, trivial MR, mild to moderate LAE, mild RVE, mildly reduced RVSF, mild RAE, PASP 74 (severe pulmonary hypertension).  He has been started back on CHF medications and returns today for further medication titration.  Doing well since last seen. Does note some DOE, but is NYHA Class II.  Sleeps at an incline.  No change.  No PND.  No edema.  No syncope.  No CP.  Compliant with meds.   Labs (4/14): K 3.7, creatinine 1.5  Wt Readings from Last 3 Encounters:  03/25/13 230 lb 12.8 oz (104.69 kg)  02/23/13 228 lb (103.42 kg)  02/08/13 224 lb (101.606 kg)     Past Medical History  Diagnosis Date  . Cardiomyopathy     Nonischemic. EF has been about 45%. S/P CABG.;  b.  Echo 4/14: EF 25%, global HK with inf and mid apical AK,  restrictive physiology with E/e' > 15 (elevated LV filling pressure), trivial AI/MR, mild to mod LAE, mild RVE, mild reduced RVSF, mild RAE, mod TR, PASP 74 (severe pulmonary HTN)   . Dyslipidemia   . Tobacco abuse     Remote  . Reflux   . Ulcer     In GI tract  . Colon polyps 2002  . Hyperplastic rectal polyp   . Sleep apnea     Mild    Current Outpatient Prescriptions  Medication Sig Dispense Refill  . carvedilol (COREG) 3.125 MG tablet Take 1 tablet (3.125 mg total) by mouth 2 (two) times daily.  60 tablet  3  . lisinopril (PRINIVIL,ZESTRIL) 10 MG tablet Take 1 tablet (10 mg total) by mouth 2 (two) times daily.  60 tablet  11  . Multiple Vitamin (MULTIVITAMIN) tablet Take 1 tablet by mouth daily.         No current facility-administered medications for this visit.    Allergies:   No Known Allergies  Social History:  The patient  reports that he quit smoking about 8 years ago. He does not have any smokeless tobacco history on file. He reports that he drinks about 7.0 ounces of alcohol per week. He reports that he does not use illicit drugs.   ROS:  Please see the history of present illness.  Notes URI symptoms  recently and occasional dyspepsia.    All other systems reviewed and negative.   PHYSICAL EXAM: VS:  BP 118/70  Pulse 65  Ht 6\' 1"  (1.854 m)  Wt 230 lb 12.8 oz (104.69 kg)  BMI 30.46 kg/m2 Well nourished, well developed, in no acute distress HEENT: normal Neck: no JVD Cardiac:  normal S1, S2; RRR; no murmur Lungs:  clear to auscultation bilaterally, no wheezing, rhonchi or rales Abd: soft, nontender, no hepatomegaly Ext: no edema Skin: warm and dry Neuro:  CNs 2-12 intact, no focal abnormalities noted  EKG:  NSR, HR 65, no change from prior tracing    ASSESSMENT AND PLAN:  1. Cardiomyopathy:  Stable.  Volume looks good.  He notes some DOE.  Previously took Lasix.  Will check f/u BMET and BNP today.  Add Lasix if BNP significantly elevated.  Increase Coreg to  6.25 mg bid.   2. Hypertension:  Controlled. 3. Disposition:  Follow up with Dr. Rollene Rotunda in 4 weeks as planned.   Signed, Tereso Newcomer, PA-C   03/25/2013 10:12 AM

## 2013-03-25 NOTE — Patient Instructions (Addendum)
Your physician has recommended you make the following change in your medication: Increase Coreg ( 6.25 mg ) twice a day   Your physician recommends that you have  lab work today: bmet/bnp  Your physician recommends that you keep a follow-up appointment with Dr. Antoine Poche on June 23rd @ 4:30pm

## 2013-04-20 ENCOUNTER — Encounter: Payer: Self-pay | Admitting: Cardiology

## 2013-04-20 ENCOUNTER — Ambulatory Visit (INDEPENDENT_AMBULATORY_CARE_PROVIDER_SITE_OTHER): Payer: Self-pay | Admitting: Cardiology

## 2013-04-20 VITALS — BP 115/65 | HR 60 | Ht 73.0 in | Wt 232.2 lb

## 2013-04-20 DIAGNOSIS — I509 Heart failure, unspecified: Secondary | ICD-10-CM

## 2013-04-20 DIAGNOSIS — I428 Other cardiomyopathies: Secondary | ICD-10-CM

## 2013-04-20 DIAGNOSIS — F17200 Nicotine dependence, unspecified, uncomplicated: Secondary | ICD-10-CM

## 2013-04-20 MED ORDER — CARVEDILOL 6.25 MG PO TABS: 9.3750 mg | ORAL_TABLET | Freq: Two times a day (BID) | ORAL | Status: DC

## 2013-04-20 NOTE — Progress Notes (Signed)
HPI The patient presents for followup of his cardiomyopathy. Years ago he had an ejection fraction of 40%. 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 He did end up requiring bypass because during his diagnostic catheterization he had dissection of his left main vessel. His ejection fraction eventually improved to about 45% with medications. The etiology was thought probably to be hypertensive.  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 However, he had significant ventricular ectopy during the study. He subsequently was sent for an echocardiogram which demonstrates his EF now to be about 25%.  He has since been seen several times for med titration. He denies any symptoms however. He has no shortness of breath, PND or orthopnea. He has no palpitations, presyncope or syncope. He has no weight gain or edema. He exercises routinely. With this he reports no limitations.  He has restarted his medications and tolerated titrations. He seems to be compliant now.  No Known Allergies  Current Outpatient Prescriptions  Medication Sig Dispense Refill  . carvedilol (COREG) 6.25 MG tablet Take 1 tablet (6.25 mg total) by mouth 2 (two) times daily.  60 tablet  6  . lisinopril (PRINIVIL,ZESTRIL) 10 MG tablet Take 1 tablet (10 mg total) by mouth 2 (two) times daily.  60 tablet  11  . Multiple Vitamin (MULTIVITAMIN) tablet Take 1 tablet by mouth daily.         No current facility-administered medications for this visit.    Past Medical History  Diagnosis Date  . Cardiomyopathy     Nonischemic. EF has been about 45%. S/P CABG.;  b.  Echo 4/14: EF 25%, global HK with inf and mid apical AK, restrictive physiology with E/e' > 15 (elevated LV filling pressure), trivial AI/MR, mild to mod LAE, mild RVE, mild reduced RVSF, mild RAE, mod TR, PASP 74 (severe pulmonary HTN)   . Dyslipidemia   . Tobacco abuse     Remote  . Reflux   . Ulcer     In GI tract  . Colon polyps 2002  . Hyperplastic  rectal polyp   . Sleep apnea     Mild    Past Surgical History  Procedure Laterality Date  . Coronary artery bypass graft      Had a left main dissection after catheterization. Underwent a saphenous vein graft to the LAD and a saphenous vein graft to obtuse marginal.  . Cardiac catheterization    . Leg tendon surgery      Right patellar    ROS:  As stated in the HPI and negative for all other systems.  PHYSICAL EXAM BP 115/65  Pulse 60  Ht 6\' 1"  (1.854 m)  Wt 232 lb 3.2 oz (105.325 kg)  BMI 30.64 kg/m2 GENERAL:  Well appearing NECK:  No jugular venous distention, waveform within normal limits, carotid upstroke brisk and symmetric, no bruits, no thyromegaly LUNGS:  Clear to auscultation bilaterally CHEST: Well healed sternotomy scar. HEART:  PMI not displaced or sustained,S1 and S2 within normal limits, no S3, no S4, no clicks, no rubs, no murmurs ABD:  Flat, positive bowel sounds normal in frequency in pitch, no bruits, no rebound, no guarding, no midline pulsatile mass, no hepatomegaly, no splenomegaly EXT:  2 plus pulses throughout, no edema, no cyanosis no clubbing   ASSESSMENT AND PLAN:  CARDIOMYOPATHY:  Today I will increase his carvedilol to 9.375 mg twice daily. He will otherwise continue on the meds as listed.   HTN:  His blood pressure is actually not elevated. It will be managed in the context of treating his cardiomyopathy.

## 2013-04-20 NOTE — Patient Instructions (Addendum)
Please increase your Carvedilol to 6.25 mg 1 and 1/2 tablets twice a day Continue all other medications as listed.  Follow up with Tereso Newcomer, PA for medication titration.

## 2013-04-26 ENCOUNTER — Ambulatory Visit: Payer: Self-pay | Admitting: Cardiology

## 2013-05-11 ENCOUNTER — Ambulatory Visit: Payer: Self-pay | Admitting: Physician Assistant

## 2014-02-19 ENCOUNTER — Other Ambulatory Visit: Payer: Self-pay | Admitting: Cardiology

## 2014-03-25 ENCOUNTER — Ambulatory Visit: Payer: Self-pay | Admitting: Cardiology

## 2014-03-25 ENCOUNTER — Encounter: Payer: Self-pay | Admitting: Cardiology

## 2014-03-25 ENCOUNTER — Ambulatory Visit (INDEPENDENT_AMBULATORY_CARE_PROVIDER_SITE_OTHER): Payer: 59 | Admitting: Cardiology

## 2014-03-25 VITALS — BP 137/85 | HR 52 | Ht 73.0 in | Wt 237.0 lb

## 2014-03-25 DIAGNOSIS — R5383 Other fatigue: Secondary | ICD-10-CM

## 2014-03-25 DIAGNOSIS — R5381 Other malaise: Secondary | ICD-10-CM

## 2014-03-25 DIAGNOSIS — I509 Heart failure, unspecified: Secondary | ICD-10-CM

## 2014-03-25 DIAGNOSIS — G4730 Sleep apnea, unspecified: Secondary | ICD-10-CM

## 2014-03-25 MED ORDER — CARVEDILOL 12.5 MG PO TABS: 12.5000 mg | ORAL_TABLET | Freq: Two times a day (BID) | ORAL | Status: DC

## 2014-03-25 NOTE — Patient Instructions (Addendum)
The current medical regimen is effective;  continue present plan and medications.  You have been referred to Dr Gwenette Greet for treatment of your sleep apnea.  Follow up in 1 year with Dr [name] at the [facility] office.  You will receive a letter in the mail 2 months before you are due.  Please call us when you receive this letter to schedule your follow up appointment.

## 2014-03-25 NOTE — Progress Notes (Signed)
HPI The patient presents for followup of his cardiomyopathy. Years ago he had an ejection fraction of 40%. 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 He did end up requiring bypass because during his diagnostic catheterization he had dissection of his left main vessel. His ejection fraction eventually improved to about 45% with medications. The etiology was thought probably to be hypertensive.  Subsequent echocardiogram demonstrates his EF now to be about 25%.   He denies any chronic symptoms. He has no shortness of breath, PND or orthopnea. He has no palpitations, presyncope or syncope. He has no weight gain or edema.  He has occasional brief episodes of needing to take a deep breath. However, he can do vigorous work although it is getting harder as he gets older. He does have significant fatigue. His son said he stop breathing and snoring loudly recently. He's had a diagnosis of sleep apnea but has not been worked any therapy for this.  No Known Allergies  Current Outpatient Prescriptions  Medication Sig Dispense Refill  . carvedilol (COREG) 6.25 MG tablet TAKE ONE & ONE-HALF TABLETS BY MOUTH TWICE DAILY  90 tablet  0  . lisinopril (PRINIVIL,ZESTRIL) 10 MG tablet Take 1 tablet (10 mg total) by mouth 2 (two) times daily.  60 tablet  11  . Multiple Vitamin (MULTIVITAMIN) tablet Take 1 tablet by mouth daily.         No current facility-administered medications for this visit.    Past Medical History  Diagnosis Date  . Cardiomyopathy     Nonischemic. EF has been about 45%. S/P CABG.;  b.  Echo 4/14: EF 25%, global HK with inf and mid apical AK, restrictive physiology with E/e' > 15 (elevated LV filling pressure), trivial AI/MR, mild to mod LAE, mild RVE, mild reduced RVSF, mild RAE, mod TR, PASP 74 (severe pulmonary HTN)   . Dyslipidemia   . Tobacco abuse     Remote  . Reflux   . Ulcer     In GI tract  . Colon polyps 2002  . Hyperplastic rectal polyp   . Sleep apnea     Mild    Past Surgical  History  Procedure Laterality Date  . Coronary artery bypass graft      Had a left main dissection after catheterization. Underwent a saphenous vein graft to the LAD and a saphenous vein graft to obtuse marginal.  . Cardiac catheterization    . Leg tendon surgery      Right patellar    ROS:  As stated in the HPI and negative for all other systems.  PHYSICAL EXAM BP 137/85  Pulse 52  Ht 6\' 1"  (1.854 m)  Wt 237 lb (107.502 kg)  BMI 31.27 kg/m2 GENERAL:  Well appearing NECK:  No jugular venous distention, waveform within normal limits, carotid upstroke brisk and symmetric, no bruits, no thyromegaly LUNGS:  Clear to auscultation bilaterally CHEST: Well healed sternotomy scar. HEART:  PMI not displaced or sustained,S1 and S2 within normal limits, no S3, no S4, no clicks, no rubs, no murmurs ABD:  Flat, positive bowel sounds normal in frequency in pitch, no bruits, no rebound, no guarding, no midline pulsatile mass, no hepatomegaly, no splenomegaly EXT:  2 plus pulses throughout, no edema, no cyanosis no clubbing  EKG:  Sinus rhythm, rate 52, left axis deviation, nonspecific lateral T-wave changes unchanged from previous. 03/25/2014  ASSESSMENT AND PLAN:  CARDIOMYOPATHY:  Since he increased the carvedilol 12.5 twice a day and is tolerating this I will  continue this regimen. No other change in therapy is indicated.   HTN:  His blood pressure is actually not elevated. It will be managed in the context of treating his cardiomyopathy.   FATIGUE:  I suspect this is untreated sleep apnea and I will send him to see Dr. Gwenette Greet

## 2014-04-22 ENCOUNTER — Telehealth: Payer: Self-pay | Admitting: Cardiology

## 2014-04-22 ENCOUNTER — Institutional Professional Consult (permissible substitution): Payer: 59 | Admitting: Pulmonary Disease

## 2014-04-22 NOTE — Telephone Encounter (Signed)
Was schedule to see Dr. Gwenette Greet on 04-22-14  Per Patient (pt had to cancel appt since we could not get a referral through [facility], pt will cb to rs) /cb refused appointment.

## 2014-04-25 NOTE — Telephone Encounter (Signed)
Noted - Thank you. Pt has cancelled and no showed multiple appts.

## 2014-05-16 ENCOUNTER — Ambulatory Visit: Payer: 59 | Admitting: Cardiology

## 2014-07-14 ENCOUNTER — Other Ambulatory Visit: Payer: Self-pay | Admitting: Physician Assistant

## 2014-09-19 ENCOUNTER — Encounter: Payer: Self-pay | Admitting: Cardiology

## 2014-11-02 ENCOUNTER — Emergency Department (INDEPENDENT_AMBULATORY_CARE_PROVIDER_SITE_OTHER)
Admission: EM | Admit: 2014-11-02 | Discharge: 2014-11-02 | Disposition: A | Discharge status: Home / Self Care | Payer: Self-pay | Source: Home / Self Care | Attending: Emergency Medicine | Admitting: Emergency Medicine

## 2014-11-02 ENCOUNTER — Encounter (HOSPITAL_COMMUNITY): Payer: Self-pay | Admitting: Emergency Medicine

## 2014-11-02 DIAGNOSIS — M7552 Bursitis of left shoulder: Secondary | ICD-10-CM

## 2014-11-02 DIAGNOSIS — M7022 Olecranon bursitis, left elbow: Secondary | ICD-10-CM

## 2014-11-02 DIAGNOSIS — R202 Paresthesia of skin: Secondary | ICD-10-CM

## 2014-11-02 DIAGNOSIS — M7702 Medial epicondylitis, left elbow: Secondary | ICD-10-CM

## 2014-11-02 MED ORDER — METHYLPREDNISOLONE (PAK) 4 MG PO TABS: ORAL_TABLET | ORAL | Status: DC

## 2014-11-02 NOTE — ED Notes (Signed)
C/o numbness of left hand to left forearm and pain from left brachial to left shoulder onset 5 days Recalls he fell 2 months ago Pain is constant and increases w/activity Alert, no signs of acute distress.

## 2014-11-02 NOTE — Discharge Instructions (Signed)
Please use Medrol as prescribed and I have arranged an appointment for you to be seen at [facility] with Dr. Micheline Chapman on 11/14/2014 at 3:00pm. If symptoms become worse or severe before your appointment, please report to your nearest ER for evaluation.  Bursitis Bursitis is a swelling and soreness (inflammation) of a fluid-filled sac (bursa) that overlies and protects a joint. It can be caused by injury, overuse of the joint, arthritis or infection. The joints most likely to be affected are the elbows, shoulders, hips and knees. HOME CARE INSTRUCTIONS   Apply ice to the affected area for 15-20 minutes each hour while awake for 2 days. Put the ice in a plastic bag and place a towel between the bag of ice and your skin.  Rest the injured joint as much as possible, but continue to put the joint through a full range of motion, 4 times per day. (The shoulder joint especially becomes rapidly "frozen" if not used.) When the pain lessens, begin normal slow movements and usual activities.  Only take over-the-counter or prescription medicines for pain, discomfort or fever as directed by your caregiver.  Your caregiver may recommend draining the bursa and injecting medicine into the bursa. This may help the healing process.  Follow all instructions for follow-up with your caregiver. This includes any orthopedic referrals, physical therapy and rehabilitation. Any delay in obtaining necessary care could result in a delay or failure of the bursitis to heal and chronic pain. SEEK IMMEDIATE MEDICAL CARE IF:   Your pain increases even during treatment.  You develop an oral temperature above 102 F (38.9 C) and have heat and inflammation over the involved bursa. MAKE SURE YOU:   Understand these instructions.  Will watch your condition.  Will get help right away if you are not doing well or get worse. Document Released: 10/18/2000 Document Revised: 01/13/2012 Document Reviewed:  01/10/2014 [facility] Patient Information 2015 [place], [place]. This information is not intended to replace advice given to you by your health care provider. Make sure you discuss any questions you have with your health care provider.  Impingement Syndrome, Rotator Cuff, Bursitis with Rehab Impingement syndrome is a condition that involves inflammation of the tendons of the rotator cuff and the subacromial bursa, that causes pain in the shoulder. The rotator cuff consists of four tendons and muscles that control much of the shoulder and upper arm function. The subacromial bursa is a fluid filled sac that helps reduce friction between the rotator cuff and one of the bones of the shoulder (acromion). Impingement syndrome is usually an overuse injury that causes swelling of the bursa (bursitis), swelling of the tendon (tendonitis), and/or a tear of the tendon (strain). Strains are classified into three categories. Grade 1 strains cause pain, but the tendon is not lengthened. Grade 2 strains include a lengthened ligament, due to the ligament being stretched or partially ruptured. With grade 2 strains there is still function, although the function may be decreased. Grade 3 strains include a complete tear of the tendon or muscle, and function is usually impaired. SYMPTOMS   Pain around the shoulder, often at the outer portion of the upper arm.  Pain that gets worse with shoulder function, especially when reaching overhead or lifting.  Sometimes, aching when not using the arm.  Pain that wakes you up at night.  Sometimes, tenderness, swelling, warmth, or redness over the affected area.  Loss of strength.  Limited motion of the shoulder, especially reaching behind the  back (to the back pocket or to unhook bra) or across your body.  Crackling sound (crepitation) when moving the arm.  Biceps tendon pain and inflammation (in the front of the shoulder). Worse when bending the elbow or lifting. CAUSES    Impingement syndrome is often an overuse injury, in which chronic (repetitive) motions cause the tendons or bursa to become inflamed. A strain occurs when a force is paced on the tendon or muscle that is greater than it can withstand. Common mechanisms of injury include: Stress from sudden increase in duration, frequency, or intensity of training.  Direct hit (trauma) to the shoulder.  Aging, erosion of the tendon with normal use.  Bony bump on shoulder (acromial spur). RISK INCREASES WITH:  Contact sports (football, wrestling, boxing).  Throwing sports (baseball, tennis, volleyball).  Weightlifting and bodybuilding.  Heavy labor.  Previous injury to the rotator cuff, including impingement.  Poor shoulder strength and flexibility.  Failure to warm up properly before activity.  Inadequate protective equipment.  Old age.  Bony bump on shoulder (acromial spur). PREVENTION   Warm up and stretch properly before activity.  Allow for adequate recovery between workouts.  Maintain physical fitness:  Strength, flexibility, and endurance.  Cardiovascular fitness.  Learn and use proper exercise technique. PROGNOSIS  If treated properly, impingement syndrome usually goes away within 6 weeks. Sometimes surgery is required.  RELATED COMPLICATIONS   Longer healing time if not properly treated, or if not given enough time to heal.  Recurring symptoms, that result in a chronic condition.  Shoulder stiffness, frozen shoulder, or loss of motion.  Rotator cuff tendon tear.  Recurring symptoms, especially if activity is resumed too soon, with overuse, with a direct blow, or when using poor technique. TREATMENT  Treatment first involves the use of ice and medicine, to reduce pain and inflammation. The use of strengthening and stretching exercises may help reduce pain with activity. These exercises may be performed at home or with a therapist. If non-surgical treatment is  unsuccessful after more than 6 months, surgery may be advised. After surgery and rehabilitation, activity is usually possible in 3 months.  MEDICATION  If pain medicine is needed, nonsteroidal anti-inflammatory medicines (aspirin and ibuprofen), or other minor pain relievers (acetaminophen), are often advised.  Do not take pain medicine for 7 days before surgery.  Prescription pain relievers may be given, if your caregiver thinks they are needed. Use only as directed and only as much as you need.  Corticosteroid injections may be given by your caregiver. These injections should be reserved for the most serious cases, because they may only be given a certain number of times. HEAT AND COLD  Cold treatment (icing) should be applied for 10 to 15 minutes every 2 to 3 hours for inflammation and pain, and immediately after activity that aggravates your symptoms. Use ice packs or an ice massage.  Heat treatment may be used before performing stretching and strengthening activities prescribed by your caregiver, physical therapist, or athletic trainer. Use a heat pack or a warm water soak. SEEK MEDICAL CARE IF:   Symptoms get worse or do not improve in 4 to 6 weeks, despite treatment.  New, unexplained symptoms develop. (Drugs used in treatment may produce side effects.) EXERCISES  RANGE OF MOTION (ROM) AND STRETCHING EXERCISES - Impingement Syndrome (Rotator Cuff  Tendinitis, Bursitis) These exercises may help you when beginning to rehabilitate your injury. Your symptoms may go away with or without further involvement from your physician, physical therapist or  Product/process development scientist. While completing these exercises, remember:   Restoring tissue flexibility helps normal motion to return to the joints. This allows healthier, less painful movement and activity.  An effective stretch should be held for at least 30 seconds.  A stretch should never be painful. You should only feel a gentle lengthening or  release in the stretched tissue. STRETCH - Flexion, Standing  Stand with good posture. With an underhand grip on your right / left hand, and an overhand grip on the opposite hand, grasp a broomstick or cane so that your hands are a little more than shoulder width apart.  Keeping your right / left elbow straight and shoulder muscles relaxed, push the stick with your opposite hand, to raise your right / left arm in front of your body and then overhead. Raise your arm until you feel a stretch in your right / left shoulder, but before you have increased shoulder pain.  Try to avoid shrugging your right / left shoulder as your arm rises, by keeping your shoulder blade tucked down and toward your mid-back spine. Hold for __________ seconds.  Slowly return to the starting position. Repeat __________ times. Complete this exercise __________ times per day. STRETCH - Abduction, Supine  Lie on your back. With an underhand grip on your right / left hand and an overhand grip on the opposite hand, grasp a broomstick or cane so that your hands are a little more than shoulder width apart.  Keeping your right / left elbow straight and your shoulder muscles relaxed, push the stick with your opposite hand, to raise your right / left arm out to the side of your body and then overhead. Raise your arm until you feel a stretch in your right / left shoulder, but before you have increased shoulder pain.  Try to avoid shrugging your right / left shoulder as your arm rises, by keeping your shoulder blade tucked down and toward your mid-back spine. Hold for __________ seconds.  Slowly return to the starting position. Repeat __________ times. Complete this exercise __________ times per day. ROM - Flexion, Active-Assisted  Lie on your back. You may bend your knees for comfort.  Grasp a broomstick or cane so your hands are about shoulder width apart. Your right / left hand should grip the end of the stick, so that your  hand is positioned "thumbs-up," as if you were about to shake hands.  Using your healthy arm to lead, raise your right / left arm overhead, until you feel a gentle stretch in your shoulder. Hold for __________ seconds.  Use the stick to assist in returning your right / left arm to its starting position. Repeat __________ times. Complete this exercise __________ times per day.  ROM - Internal Rotation, Supine   Lie on your back on a firm surface. Place your right / left elbow about 60 degrees away from your side. Elevate your elbow with a folded towel, so that the elbow and shoulder are the same height.  Using a broomstick or cane and your strong arm, pull your right / left hand toward your body until you feel a gentle stretch, but no increase in your shoulder pain. Keep your shoulder and elbow in place throughout the exercise.  Hold for __________ seconds. Slowly return to the starting position. Repeat __________ times. Complete this exercise __________ times per day. STRETCH - Internal Rotation  Place your right / left hand behind your back, palm up.  Throw a towel or belt over  your opposite shoulder. Grasp the towel with your right / left hand.  While keeping an upright posture, gently pull up on the towel, until you feel a stretch in the front of your right / left shoulder.  Avoid shrugging your right / left shoulder as your arm rises, by keeping your shoulder blade tucked down and toward your mid-back spine.  Hold for __________ seconds. Release the stretch, by lowering your healthy hand. Repeat __________ times. Complete this exercise __________ times per day. ROM - Internal Rotation   Using an underhand grip, grasp a stick behind your back with both hands.  While standing upright with good posture, slide the stick up your back until you feel a mild stretch in the front of your shoulder.  Hold for __________ seconds. Slowly return to your starting position. Repeat __________  times. Complete this exercise __________ times per day.  STRETCH - Posterior Shoulder Capsule   Stand or sit with good posture. Grasp your right / left elbow and draw it across your chest, keeping it at the same height as your shoulder.  Pull your elbow, so your upper arm comes in closer to your chest. Pull until you feel a gentle stretch in the back of your shoulder.  Hold for __________ seconds. Repeat __________ times. Complete this exercise __________ times per day. STRENGTHENING EXERCISES - Impingement Syndrome (Rotator Cuff Tendinitis, Bursitis) These exercises may help you when beginning to rehabilitate your injury. They may resolve your symptoms with or without further involvement from your physician, physical therapist or athletic trainer. While completing these exercises, remember:  Muscles can gain both the endurance and the strength needed for everyday activities through controlled exercises.  Complete these exercises as instructed by your physician, physical therapist or athletic trainer. Increase the resistance and repetitions only as guided.  You may experience muscle soreness or fatigue, but the pain or discomfort you are trying to eliminate should never worsen during these exercises. If this pain does get worse, stop and make sure you are following the directions exactly. If the pain is still present after adjustments, discontinue the exercise until you can discuss the trouble with your clinician.  During your recovery, avoid activity or exercises which involve actions that place your injured hand or elbow above your head or behind your back or head. These positions stress the tissues which you are trying to heal. STRENGTH - Scapular Depression and Adduction   With good posture, sit on a firm chair. Support your arms in front of you, with pillows, arm rests, or on a table top. Have your elbows in line with the sides of your body.  Gently draw your shoulder blades down and  toward your mid-back spine. Gradually increase the tension, without tensing the muscles along the top of your shoulders and the back of your neck.  Hold for __________ seconds. Slowly release the tension and relax your muscles completely before starting the next repetition.  After you have practiced this exercise, remove the arm support and complete the exercise in standing as well as sitting position. Repeat __________ times. Complete this exercise __________ times per day.  STRENGTH - Shoulder Abductors, Isometric  With good posture, stand or sit about 4-6 inches from a wall, with your right / left side facing the wall.  Bend your right / left elbow. Gently press your right / left elbow into the wall. Increase the pressure gradually, until you are pressing as hard as you can, without shrugging your shoulder or increasing any  shoulder discomfort.  Hold for __________ seconds.  Release the tension slowly. Relax your shoulder muscles completely before you begin the next repetition. Repeat __________ times. Complete this exercise __________ times per day.  STRENGTH - External Rotators, Isometric  Keep your right / left elbow at your side and bend it 90 degrees.  Step into a door frame so that the outside of your right / left wrist can press against the door frame without your upper arm leaving your side.  Gently press your right / left wrist into the door frame, as if you were trying to swing the back of your hand away from your stomach. Gradually increase the tension, until you are pressing as hard as you can, without shrugging your shoulder or increasing any shoulder discomfort.  Hold for __________ seconds.  Release the tension slowly. Relax your shoulder muscles completely before you begin the next repetition. Repeat __________ times. Complete this exercise __________ times per day.  STRENGTH - Supraspinatus   Stand or sit with good posture. Grasp a __________ weight, or an exercise  band or tubing, so that your hand is "thumbs-up," like you are shaking hands.  Slowly lift your right / left arm in a "V" away from your thigh, diagonally into the space between your side and straight ahead. Lift your hand to shoulder height or as far as you can, without increasing any shoulder pain. At first, many people do not lift their hands above shoulder height.  Avoid shrugging your right / left shoulder as your arm rises, by keeping your shoulder blade tucked down and toward your mid-back spine.  Hold for __________ seconds. Control the descent of your hand, as you slowly return to your starting position. Repeat __________ times. Complete this exercise __________ times per day.  STRENGTH - External Rotators  Secure a rubber exercise band or tubing to a fixed object (table, pole) so that it is at the same height as your right / left elbow when you are standing or sitting on a firm surface.  Stand or sit so that the secured exercise band is at your uninjured side.  Bend your right / left elbow 90 degrees. Place a folded towel or small pillow under your right / left arm, so that your elbow is a few inches away from your side.  Keeping the tension on the exercise band, pull it away from your body, as if pivoting on your elbow. Be sure to keep your body steady, so that the movement is coming only from your rotating shoulder.  Hold for __________ seconds. Release the tension in a controlled manner, as you return to the starting position. Repeat __________ times. Complete this exercise __________ times per day.  STRENGTH - Internal Rotators   Secure a rubber exercise band or tubing to a fixed object (table, pole) so that it is at the same height as your right / left elbow when you are standing or sitting on a firm surface.  Stand or sit so that the secured exercise band is at your right / left side.  Bend your elbow 90 degrees. Place a folded towel or small pillow under your right / left  arm so that your elbow is a few inches away from your side.  Keeping the tension on the exercise band, pull it across your body, toward your stomach. Be sure to keep your body steady, so that the movement is coming only from your rotating shoulder.  Hold for __________ seconds. Release the tension in  a controlled manner, as you return to the starting position. Repeat __________ times. Complete this exercise __________ times per day.  STRENGTH - Scapular Protractors, Standing   Stand arms length away from a wall. Place your hands on the wall, keeping your elbows straight.  Begin by dropping your shoulder blades down and toward your mid-back spine.  To strengthen your protractors, keep your shoulder blades down, but slide them forward on your rib cage. It will feel as if you are lifting the back of your rib cage away from the wall. This is a subtle motion and can be challenging to complete. Ask your caregiver for further instruction, if you are not sure you are doing the exercise correctly.  Hold for __________ seconds. Slowly return to the starting position, resting the muscles completely before starting the next repetition. Repeat __________ times. Complete this exercise __________ times per day. STRENGTH - Scapular Protractors, Supine  Lie on your back on a firm surface. Extend your right / left arm straight into the air while holding a __________ weight in your hand.  Keeping your head and back in place, lift your shoulder off the floor.  Hold for __________ seconds. Slowly return to the starting position, and allow your muscles to relax completely before starting the next repetition. Repeat __________ times. Complete this exercise __________ times per day. STRENGTH - Scapular Protractors, Quadruped  Get onto your hands and knees, with your shoulders directly over your hands (or as close as you can be, comfortably).  Keeping your elbows locked, lift the back of your rib cage up into  your shoulder blades, so your mid-back rounds out. Keep your neck muscles relaxed.  Hold this position for __________ seconds. Slowly return to the starting position and allow your muscles to relax completely before starting the next repetition. Repeat __________ times. Complete this exercise __________ times per day.  STRENGTH - Scapular Retractors  Secure a rubber exercise band or tubing to a fixed object (table, pole), so that it is at the height of your shoulders when you are either standing, or sitting on a firm armless chair.  With a palm down grip, grasp an end of the band in each hand. Straighten your elbows and lift your hands straight in front of you, at shoulder height. Step back, away from the secured end of the band, until it becomes tense.  Squeezing your shoulder blades together, draw your elbows back toward your sides, as you bend them. Keep your upper arms lifted away from your body throughout the exercise.  Hold for __________ seconds. Slowly ease the tension on the band, as you reverse the directions and return to the starting position. Repeat __________ times. Complete this exercise __________ times per day. STRENGTH - Shoulder Extensors   Secure a rubber exercise band or tubing to a fixed object (table, pole) so that it is at the height of your shoulders when you are either standing, or sitting on a firm armless chair.  With a thumbs-up grip, grasp an end of the band in each hand. Straighten your elbows and lift your hands straight in front of you, at shoulder height. Step back, away from the secured end of the band, until it becomes tense.  Squeezing your shoulder blades together, pull your hands down to the sides of your thighs. Do not allow your hands to go behind you.  Hold for __________ seconds. Slowly ease the tension on the band, as you reverse the directions and return to the starting position.  Repeat __________ times. Complete this exercise __________ times per  day.  STRENGTH - Scapular Retractors and External Rotators   Secure a rubber exercise band or tubing to a fixed object (table, pole) so that it is at the height as your shoulders, when you are either standing, or sitting on a firm armless chair.  With a palm down grip, grasp an end of the band in each hand. Bend your elbows 90 degrees and lift your elbows to shoulder height, at your sides. Step back, away from the secured end of the band, until it becomes tense.  Squeezing your shoulder blades together, rotate your shoulders so that your upper arms and elbows remain stationary, but your fists travel upward to head height.  Hold for __________ seconds. Slowly ease the tension on the band, as you reverse the directions and return to the starting position. Repeat __________ times. Complete this exercise __________ times per day.  STRENGTH - Scapular Retractors and External Rotators, Rowing   Secure a rubber exercise band or tubing to a fixed object (table, pole) so that it is at the height of your shoulders, when you are either standing, or sitting on a firm armless chair.  With a palm down grip, grasp an end of the band in each hand. Straighten your elbows and lift your hands straight in front of you, at shoulder height. Step back, away from the secured end of the band, until it becomes tense.  Step 1: Squeeze your shoulder blades together. Bending your elbows, draw your hands to your chest, as if you are rowing a boat. At the end of this motion, your hands and elbow should be at shoulder height and your elbows should be out to your sides.  Step 2: Rotate your shoulders, to raise your hands above your head. Your forearms should be vertical and your upper arms should be horizontal.  Hold for __________ seconds. Slowly ease the tension on the band, as you reverse the directions and return to the starting position. Repeat __________ times. Complete this exercise __________ times per day.  STRENGTH  - Scapular Depressors  Find a sturdy chair without wheels, such as a dining room chair.  Keeping your feet on the floor, and your hands on the chair arms, lift your bottom up from the seat, and lock your elbows.  Keeping your elbows straight, allow gravity to pull your body weight down. Your shoulders will rise toward your ears.  Raise your body against gravity by drawing your shoulder blades down your back, shortening the distance between your shoulders and ears. Although your feet should always maintain contact with the floor, your feet should progressively support less body weight, as you get stronger.  Hold for __________ seconds. In a controlled and slow manner, lower your body weight to begin the next repetition. Repeat __________ times. Complete this exercise __________ times per day.  Document Released: 10/21/2005 Document Revised: 01/13/2012 Document Reviewed: 02/02/2009 [facility] Patient Information 2015 [place], [place]. This information is not intended to replace advice given to you by your health care provider. Make sure you discuss any questions you have with your health care provider.  Olecranon Bursitis Bursitis is swelling and soreness (inflammation) of a fluid-filled sac (bursa) that covers and protects a joint. Olecranon bursitis occurs over the elbow.  CAUSES Bursitis can be caused by injury, overuse of the joint, arthritis, or infection.  SYMPTOMS   Tenderness, swelling, warmth, or redness over the elbow.  Elbow pain with movement. This is greater with  bending the elbow.  Squeaking sound when the bursa is rubbed or moved.  Increasing size of the bursa without pain or discomfort.  Fever with increasing pain and swelling if the bursa becomes infected. HOME CARE INSTRUCTIONS   Put ice on the affected area.  Put ice in a plastic bag.  Place a towel between your skin and the bag.  Leave the ice on for 15-20 minutes each hour while awake. Do this for the first 2  days.  When resting, elevate your elbow above the level of your heart. This helps reduce swelling.  Continue to put the joint through a full range of motion 4 times per day. Rest the injured joint at other times. When the pain lessens, begin normal slow movements and usual activities.  Only take over-the-counter or prescription medicines for pain, discomfort, or fever as directed by your caregiver.  Reduce your intake of milk and related dairy products (cheese, yogurt). They may make your condition worse. SEEK IMMEDIATE MEDICAL CARE IF:   Your pain increases even during treatment.  You have a fever.  You have heat and inflammation over the bursa and elbow.  You have a red line that goes up your arm.  You have pain with movement of your elbow. MAKE SURE YOU:   Understand these instructions.  Will watch your condition.  Will get help right away if you are not doing well or get worse. Document Released: 11/20/2006 Document Revised: 01/13/2012 Document Reviewed: 10/06/2007 [facility] Patient Information 2015 [place], [place]. This information is not intended to replace advice given to you by your health care provider. Make sure you discuss any questions you have with your health care provider.  Paresthesia Paresthesia is an abnormal burning or prickling sensation. This sensation is generally felt in the hands, arms, legs, or feet. However, it may occur in any part of the body. It is usually not painful. The feeling may be described as:  Tingling or numbness.  "Pins and needles."  Skin crawling.  Buzzing.  Limbs "falling asleep."  Itching. Most people experience temporary (transient) paresthesia at some time in their lives. CAUSES  Paresthesia may occur when you breathe too quickly (hyperventilation). It can also occur without any apparent cause. Commonly, paresthesia occurs when pressure is placed on a nerve. The feeling quickly goes away once the pressure is removed. For some  people, however, paresthesia is a long-lasting (chronic) condition caused by an underlying disorder. The underlying disorder may be:  A traumatic, direct injury to nerves. Examples include a:  Broken (fractured) neck.  Fractured skull.  A disorder affecting the brain and spinal cord (central nervous system). Examples include:  Transverse myelitis.  Encephalitis.  Transient ischemic attack.  Multiple sclerosis.  Stroke.  Tumor or blood vessel problems, such as an arteriovenous malformation pressing against the brain or spinal cord.  A condition that damages the peripheral nerves (peripheral neuropathy). Peripheral nerves are not part of the brain and spinal cord. These conditions include:  Diabetes.  Peripheral vascular disease.  Nerve entrapment syndromes, such as carpal tunnel syndrome.  Shingles.  Hypothyroidism.  Vitamin B12 deficiencies.  Alcoholism.  Heavy metal poisoning (lead, arsenic).  Rheumatoid arthritis.  Systemic lupus erythematosus. DIAGNOSIS  Your caregiver will attempt to find the underlying cause of your paresthesia. Your caregiver may:  Take your medical history.  Perform a physical exam.  Order various lab tests.  Order imaging tests. TREATMENT  Treatment for paresthesia depends on the underlying cause. HOME CARE INSTRUCTIONS  Avoid drinking  alcohol.  You may consider massage or acupuncture to help relieve your symptoms.  Keep all follow-up appointments as directed by your caregiver. SEEK IMMEDIATE MEDICAL CARE IF:   You feel weak.  You have trouble walking or moving.  You have problems with speech or vision.  You feel confused.  You cannot control your bladder or bowel movements.  You feel numbness after an injury.  You faint.  Your burning or prickling feeling gets worse when walking.  You have pain, cramps, or dizziness.  You develop a rash. MAKE SURE YOU:  Understand these instructions.  Will watch your  condition.  Will get help right away if you are not doing well or get worse. Document Released: 10/11/2002 Document Revised: 01/13/2012 Document Reviewed: 07/12/2011 [facility] Patient Information 2015 [place], [place]. This information is not intended to replace advice given to you by your health care provider. Make sure you discuss any questions you have with your health care provider.

## 2014-11-02 NOTE — ED Provider Notes (Signed)
CSN: [id number]     Arrival date & time 11/02/14  1114 History   First MD Initiated Contact with Patient 11/02/14 1210     Chief Complaint  Patient presents with  . Arm Pain   (Consider location/radiation/quality/duration/timing/severity/associated sxs/prior Treatment) HPI Comments: Patient reports 4-5 days of pain at left shoulder with movement of left upper extremity. Also states he has had some discomfort at left elbow with a sense of numbness and tingling that begins at his left elbow and progresses down lateral forearm to thumb, index finger and lateral portion of middle finger. Denies known injury, neck pain, fever, rash, chest pain, dyspnea, nausea, diaphoresis or headache. States symptoms improve with use of [employer] Pow[employer].  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 If he is not steering with his left hand, he rests his left elbow on the drivers side window sill or arm rest.   The history is provided by the patient.    Past Medical History  Diagnosis Date  . Cardiomyopathy     Nonischemic. EF has been about 45%. S/P CABG.;  b.  Echo 4/14: EF 25%, global HK with inf and mid apical AK, restrictive physiology with E/e' > 15 (elevated LV filling pressure), trivial AI/MR, mild to mod LAE, mild RVE, mild reduced RVSF, mild RAE, mod TR, PASP 74 (severe pulmonary HTN)   . Dyslipidemia   . Tobacco abuse     Remote  . Reflux   . Ulcer     In GI tract  . Colon polyps 2002  . Hyperplastic rectal polyp   . Sleep apnea     Mild   Past Surgical History  Procedure Laterality Date  . Coronary artery bypass graft      Had a left main dissection after catheterization. Underwent a saphenous vein graft to the LAD and a saphenous vein graft to obtuse marginal.  . Cardiac catheterization    . Leg tendon surgery      Right patellar   Family History  Problem Relation Age of Onset  . Lung cancer Other   . Cancer Other    History    Substance Use Topics  . Smoking status: Former Smoker    Quit date: 11/04/2004  . Smokeless tobacco: Not on file  . Alcohol Use: 7.0 oz/week    14 Not specified per week     Comment: 2 per day; denies alcohol abuse. Had a previous history of failly heavy alcohol use in the remote past    Review of Systems  All other systems reviewed and are negative.   Allergies  Review of patient's allergies indicates no known allergies.  Home Medications   Prior to Admission medications   Medication Sig Start Date End Date Taking? Authorizing Provider  carvedilol (COREG) 12.5 MG tablet Take 1 tablet (12.5 mg total) by mouth 2 (two) times daily with a meal. 03/25/14  Yes Minus Breeding, MD  lisinopril (PRINIVIL,ZESTRIL) 10 MG tablet TAKE ONE TABLET BY MOUTH TWICE DAILY 07/15/14  Yes Minus Breeding, MD  methylPREDNIsolone (MEDROL DOSPACK) 4 MG tablet follow package directions 11/02/14   Lutricia Feil, PA  Multiple Vitamin (MULTIVITAMIN) tablet Take 1 tablet by mouth daily.      Historical Provider, MD   BP 109/68 mmHg  Pulse 56  Temp(Src) 97.8 F (36.6 C) (Oral)  Resp 16  SpO2 97% Physical Exam  Constitutional: He is oriented to person, place, and time. He appears  well-developed and well-nourished.  Cardiovascular: Normal rate, regular rhythm and normal heart sounds.   Pulmonary/Chest: Effort normal and breath sounds normal. No respiratory distress.  Musculoskeletal: Normal range of motion.       Left shoulder: He exhibits tenderness. He exhibits normal range of motion, no bony tenderness, no swelling, no effusion, no crepitus, no deformity, no laceration, normal pulse and normal strength.       Left elbow: He exhibits normal range of motion, no swelling, no effusion, no deformity and no laceration. Tenderness found. Medial epicondyle tenderness noted. No lateral epicondyle and no olecranon process tenderness noted.       Arms: Also area of point tenderness at midportion of left  supraspinatus muscle.  When medial epicondyle and olecranon process palpated patient states this recreates paresthesias of left index, thumb and middle finger.  Strength and pulses at left hand are normal.   Neurological: He is alert and oriented to person, place, and time.  Skin: Skin is warm and dry. No rash noted. No erythema.  Psychiatric: He has a normal mood and affect. His behavior is normal.  Nursing note and vitals reviewed.   ED Course  Procedures (including critical care time) Labs Review Labs Reviewed - No data to display  Imaging Review No results found.   MDM   1. Shoulder bursitis, left   2. Paresthesias in left hand   3. Medial epicondylitis of elbow, left    I suspect the position that his left upper extremity is held in for long period of time while steering his truck have created not only left shoulder bursitis but the radial nerve paresthesias the patient is experiencing. Will place on medrol dose pack as prescribed. [facility] and arranged for patient to be seen in follow up by Dr. Micheline Chapman on 11/14/2014 @ 3:00pm if symptoms persist. Advised frequent repositioning of left UE while driving an to avoid resting left elbow on arm rest for extended periods of time. These symptoms do not appear to be of cardiovascular origin.    Lutricia Feil, [place] 11/02/14 [phone]

## 2014-11-14 ENCOUNTER — Encounter: Payer: Self-pay | Admitting: Sports Medicine

## 2014-11-14 ENCOUNTER — Ambulatory Visit (INDEPENDENT_AMBULATORY_CARE_PROVIDER_SITE_OTHER): Payer: Self-pay | Admitting: Sports Medicine

## 2014-11-14 VITALS — BP 116/70 | HR 48 | Ht 73.0 in | Wt 248.0 lb

## 2014-11-14 DIAGNOSIS — M5010 Cervical disc disorder with radiculopathy, unspecified cervical region: Secondary | ICD-10-CM

## 2014-11-14 DIAGNOSIS — M79622 Pain in left upper arm: Secondary | ICD-10-CM

## 2014-11-14 DIAGNOSIS — M25522 Pain in left elbow: Secondary | ICD-10-CM

## 2014-11-14 NOTE — Progress Notes (Signed)
Subjective:    Patient ID: Bruce Robertson, male    DOB: 04/17/1961, 54 y.o.   MRN: [id number]  HPI Bruce Robertson is a right hand dominant, 54 yo male who presents with left shoulder pain and left forearm and hand numbness. 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 At that time, he did not think much of the fall and did not initially have any pain. About 3-4 weeks ago he began to have left shoulder pain that he initially describes as feeling like he had "pulled a muscle." This pain has persisted since that time, but he has continued to go to the gym. He has noticed more pain with overhead activity, but has not noticed in limitations in his range of motion or subjective weakness. About 1 week ago he began to feel numbness down the dorsal forearm and into his thumb and index finger. This numbness has persisted since that time, present all times of the day. This numbness and shoulder pain are worsened when he laterally extends his neck toward the left side. He was seen in the ED on 11/02/14 for this pan where he started a Medrol dose pack. He completed this course of steroids and reports that it did improve his shoulder pain and numbness a small amount.      Review of Systems Negative other than noted in HPI.     Objective:   Physical Exam General: well-appearing, pleasant male in no acute distress.  Vitals: BP 116/70 P 48  Left Upper Extremity:  Inspection: no muscular atrophy, deformity or asymmetry Palpation: tender to palpation over the trapezius muscle and around the acromion to the [facility] joint. Palpation over the trapezius worsens the numbness down the arm as well.  ROM: full ROM in all planes, pain with abduction, internal and external rotation.  Strength: 5/5 with flexion, extension, abduction, adduction, internal and external rotation Special Testing: pos Spurling maneuver, neg empty can, pos Hawkin's  impingement Neurovascular: normal muscle bulk and tone in left arm, sensation grossly decreased over the C6 dermatome, biceps reflex trace, triceps reflex 2+, brachioradialis 2+, radial pulse 2+   Right Upper Extremity:  Inspection: no muscular atrophy, deformity or asymmetry Palpation: No tenderness to palpation over trapezius, along scapula, or over AC joint ROM: full ROM in all planes, pain with abduction, internal and external rotation.  Strength: 5/5 with flexion, extension, abduction, adduction, internal and external rotation Special Testing: neg Spurling maneuver, neg empty can, neg Hawkin's impingement Neurovascular: normal muscle bulk and tone in left arm, sensation grossly intact over all dermatomes, biceps reflex 2+, triceps reflex 2+, brachioradialis 2+, radial pulse 2+      Assessment & Plan:  1. Left Upper Extremity Pain and Numbness:  The description of the pain initially being a "pulled muscle" and positive Hawkin's on examination, may be consistent with a potential rotator cuff tendinopathy or partial tear, but unlikely to be a complete tear given the strength of Bruce Robertson rotator cuff on examination today. Combining this initial pain with the numbness that has since developed within the C6 dermatome, all of these symptoms can be better explained by a C6 radiculopathy. The pain involving the shoulder and radiating down into the thumb and index finger coupled with the positive Spurling maneuver further support this diagnosis. This etiology of this radiculopathy may be secondary to cervical spondylosis or disc herniation.   - Scheduled AP and  lateral radiographs of cervical spine - Schedule MRI of cervical spine - Encouraged to avoid any overhead activity as part of his workout routine in the gym - Will follow-up within a couple days after MRI and will decide most appropriate management strategy going forward    This patient was seen with and note was dictated by Crissie Sickles, MS4 [facility]).

## 2014-11-15 ENCOUNTER — Ambulatory Visit
Admission: RE | Admit: 2014-11-15 | Discharge: 2014-11-15 | Disposition: A | Discharge status: Home / Self Care | Payer: No Typology Code available for payment source | Source: Ambulatory Visit | Attending: Sports Medicine | Admitting: Sports Medicine

## 2014-11-15 DIAGNOSIS — M25522 Pain in left elbow: Secondary | ICD-10-CM

## 2014-11-19 ENCOUNTER — Emergency Department (INDEPENDENT_AMBULATORY_CARE_PROVIDER_SITE_OTHER)
Admission: EM | Admit: 2014-11-19 | Discharge: 2014-11-19 | Disposition: A | Discharge status: Home / Self Care | Payer: Self-pay | Source: Home / Self Care | Attending: Emergency Medicine | Admitting: Emergency Medicine

## 2014-11-19 ENCOUNTER — Encounter (HOSPITAL_COMMUNITY): Payer: Self-pay | Admitting: *Deleted

## 2014-11-19 DIAGNOSIS — B029 Zoster without complications: Secondary | ICD-10-CM

## 2014-11-19 MED ORDER — OXYCODONE-ACETAMINOPHEN 5-325 MG PO TABS: 2.0000 | ORAL_TABLET | ORAL | Status: DC | PRN

## 2014-11-19 MED ORDER — VALACYCLOVIR HCL 1 G PO TABS: 1000.0000 mg | ORAL_TABLET | Freq: Three times a day (TID) | ORAL | Status: AC

## 2014-11-19 MED ORDER — HYDROCODONE-ACETAMINOPHEN 5-325 MG PO TABS: 1.0000 | ORAL_TABLET | Freq: Four times a day (QID) | ORAL | Status: DC | PRN

## 2014-11-19 NOTE — ED Provider Notes (Addendum)
CSN: [id number]     Arrival date & time 11/19/14  1820 History   First MD Initiated Contact with Patient 11/19/14 Bruce Robertson     Chief Complaint  Patient presents with  . Neck Pain  . Headache   (Consider location/radiation/quality/duration/timing/severity/associated sxs/prior Treatment) HPI  Bruce Robertson is a 54 year old man here for evaluation of right-sided neck and head pain. This started yesterday at 10 AM. It initially was just behind his right ear, but has spread to involve the right posterior scalp and his right neck. Bruce Robertson states that area is very sensitive to touch and feels warm and swollen. There is no rash. Bruce Robertson denies any fevers or chills. Bruce Robertson denies any injury or change in activity. The pain is worse with moving his head, particularly looking to the right. No pain radiating down to his hand. No numbness, tingling, weakness in the upper extremity. Bruce Robertson states that over the course of the last hour, Bruce Robertson feels like the pain is spreading to just in front of his ear. Bruce Robertson also states his vision seems a little worse, this is bilateral but slightly worse in the right eye.  Bruce Robertson does have a history of chickenpox.  Past Medical History  Diagnosis Date  . Cardiomyopathy     Nonischemic. EF has been about 45%. S/P CABG.;  b.  Echo 4/14: EF 25%, global HK with inf and mid apical AK, restrictive physiology with E/e' > 15 (elevated LV filling pressure), trivial AI/MR, mild to mod LAE, mild RVE, mild reduced RVSF, mild RAE, mod TR, PASP 74 (severe pulmonary HTN)   . Dyslipidemia   . Tobacco abuse     Remote  . Reflux   . Ulcer     In GI tract  . Colon polyps 2002  . Hyperplastic rectal polyp   . Sleep apnea     Mild   Past Surgical History  Procedure Laterality Date  . Coronary artery bypass graft  2007    Had a left main dissection after catheterization. Underwent a saphenous vein graft to the LAD and a saphenous vein graft to obtuse marginal.  . Leg tendon surgery  2005    Right patellar  . Cardiac  catheterization  2007 and 2009     x 2   Family History  Problem Relation Age of Onset  . Lung cancer Other   . Cancer Other   . Hypertension Mother   . Osteoarthritis Mother   . Heart failure Father    History  Substance Use Topics  . Smoking status: Former Smoker    Quit date: 11/04/2004  . Smokeless tobacco: Not on file  . Alcohol Use: 7.0 oz/week    14 Not specified per week     Comment: 2 per day; denies alcohol abuse. Had a previous history of failly heavy alcohol use in the remote past/ occasional    Review of Systems As in history of present illness Allergies  Review of patient's allergies indicates no known allergies.  Home Medications   Prior to Admission medications   Medication Sig Start Date End Date Taking? Authorizing Provider  carvedilol (COREG) 12.5 MG tablet Take 1 tablet (12.5 mg total) by mouth 2 (two) times daily with a meal. 03/25/14  Yes Minus Breeding, MD  lisinopril (PRINIVIL,ZESTRIL) 10 MG tablet TAKE ONE TABLET BY MOUTH TWICE DAILY 07/15/14  Yes Minus Breeding, MD  Multiple Vitamin (MULTIVITAMIN) tablet Take 1 tablet by mouth daily.     Yes Historical Provider, MD  HYDROcodone-acetaminophen (NORCO) 5-325 MG  per tablet Take 1 tablet by mouth every 6 (six) hours as needed for moderate pain. 11/19/14   Melony Overly, MD  methylPREDNIsolone (MEDROL DOSPACK) 4 MG tablet follow package directions 11/02/14   Lutricia Feil, PA  valACYclovir (VALTREX) 1000 MG tablet Take 1 tablet (1,000 mg total) by mouth 3 (three) times daily. 11/19/14 12/03/14  Melony Overly, MD   BP 146/90 mmHg  Pulse 68  Temp(Src) 98.2 F (36.8 C) (Oral)  Resp 16  SpO2 96% Physical Exam  Constitutional: Bruce Robertson is oriented to person, place, and time. Bruce Robertson appears well-developed and well-nourished. No distress.  HENT:  Head:    Area of sensitivity and pain outlined in red  Neck: Neck supple.  ROM limited in looking to the right by pain.  Cardiovascular: Normal rate.     Pulmonary/Chest: Effort normal.  Lymphadenopathy:    Bruce Robertson has no cervical adenopathy.  Neurological: Bruce Robertson is alert and oriented to person, place, and time. No cranial nerve deficit. Bruce Robertson exhibits normal muscle tone.  Skin: No rash noted.    ED Course  Procedures (including critical care time) Labs Review Labs Reviewed - No data to display  Imaging Review No results found.   MDM   1. Shingles    I suspect this is an early presentation of shingles given his description of nerve type pain and its distribution primarily in C2. Will treat with Valtrex and Norco for pain. Reviewed warning signs that would warrant a trip to the emergency room as in after visit summary. Follow-up here if not improving in the next few days.    Melony Overly, MD 11/19/14 1931  11/21/2014, 12:43 PM Patient called and stated the percocet is not helping his pain.  Bruce Robertson has been taking 2 tablets every 4 hours.  Bruce Robertson states the pain has not changed or spread.  Discussed that Bruce Robertson can either come back in to be seen or we can try gabapentin.  Bruce Robertson would like to try gabapentin.  Prescription for gabapentin 300mg  TID, #90 sent to [facility] on [address].  Lynnette Caffey, MD 11/21/14 [phone]

## 2014-11-19 NOTE — ED Notes (Addendum)
C/o head and neck pain onset 1000 yesterday.  No known injury.  No previous pain like this.  Took Ibuprofen and Tylenol without relief.  Pt. is a Administrator.  Pain radiates down to R shoulder from his neck- feels heat in that area.  States R side of neck and head swollen.

## 2014-11-19 NOTE — ED Notes (Signed)
Pt. telling me at d/c that he had Tylenol 3000 mg. over 1 hr period of time this AM 0500.  Hydrocodone has acetaminophen in it.  Dr. Bridgett Larsson notified and she said he can take 1 at bedtime and one in AM.  Pt. notified. His girlfriend does not think that will be enough to relieve his pain.  He asked to see the doctor again. Dr. Bridgett Larsson notified.  She saw him and changed his Rx. to Oxycodone.

## 2014-11-19 NOTE — Discharge Instructions (Signed)
I think your pain is likely coming from Shingles. You may develop a blister like rash. Take Valtrex 1 pill 3 times a day for 7 days. Use Norco every 6 hours as needed for pain. You can try over the counter Capsaicin Cream as well.  If the pain continues to spread, you have slurred speech, or your face is asymmetric, please go to the emergency room. Follow up here if no improvement in 2-3 days.

## 2014-11-21 ENCOUNTER — Ambulatory Visit
Admission: RE | Admit: 2014-11-21 | Discharge: 2014-11-21 | Disposition: A | Discharge status: Home / Self Care | Payer: Self-pay | Source: Ambulatory Visit | Attending: Sports Medicine | Admitting: Sports Medicine

## 2014-11-21 DIAGNOSIS — M25522 Pain in left elbow: Secondary | ICD-10-CM

## 2014-11-21 MED ORDER — GABAPENTIN 300 MG PO CAPS: 300.0000 mg | ORAL_CAPSULE | Freq: Three times a day (TID) | ORAL | Status: DC

## 2014-11-24 ENCOUNTER — Other Ambulatory Visit: Payer: Self-pay | Admitting: *Deleted

## 2014-11-24 ENCOUNTER — Telehealth: Payer: Self-pay | Admitting: *Deleted

## 2014-11-24 ENCOUNTER — Telehealth: Payer: Self-pay | Admitting: Sports Medicine

## 2014-11-24 MED ORDER — TRAMADOL HCL 50 MG PO TABS: 50.0000 mg | ORAL_TABLET | Freq: Two times a day (BID) | ORAL | Status: DC

## 2014-11-24 NOTE — Telephone Encounter (Signed)
I spoke with the patient on the phone today after reviewing the MRI of his cervical spine. He has some mild cervical spondylosis which results in some mild to moderate neural foraminal stenosis but nothing severe. I do not see a discrete abnormality at the left C6 nerve root which is the distribution of pain and numbness in his left arm. I've recommended that we proceed with a diagnostic cervical ESI and have him follow-up with me in the office one week later for a check on his response to the injection. If the patient notices no benefit from the injection then I may need to consider either an MRI of his left shoulder or possibly an EMG/nerve conduction study. At the time of this dictation the patient was without insurance but was planning on resuming his coverage sometime in the next week or two. I instructed him to contact me once he has reestablished his insurance and we would order the [facility]. In the meantime I will try him on 300 mg of Neurontin daily at bedtime for his neuropathic pain.

## 2014-11-24 NOTE — Telephone Encounter (Signed)
Sent in tramadol for the patients arm pain, Per Dr. Micheline Chapman

## 2014-11-29 ENCOUNTER — Ambulatory Visit: Payer: Self-pay | Admitting: Sports Medicine

## 2015-04-29 ENCOUNTER — Emergency Department (HOSPITAL_COMMUNITY): Payer: Self-pay

## 2015-04-29 ENCOUNTER — Emergency Department (HOSPITAL_COMMUNITY)
Admission: EM | Admit: 2015-04-29 | Discharge: 2015-04-29 | Disposition: A | Discharge status: Home / Self Care | Payer: Self-pay | Attending: Emergency Medicine | Admitting: Emergency Medicine

## 2015-04-29 ENCOUNTER — Encounter (HOSPITAL_COMMUNITY): Payer: Self-pay | Admitting: *Deleted

## 2015-04-29 DIAGNOSIS — Z951 Presence of aortocoronary bypass graft: Secondary | ICD-10-CM | POA: Insufficient documentation

## 2015-04-29 DIAGNOSIS — I2510 Atherosclerotic heart disease of native coronary artery without angina pectoris: Secondary | ICD-10-CM | POA: Insufficient documentation

## 2015-04-29 DIAGNOSIS — Z8669 Personal history of other diseases of the nervous system and sense organs: Secondary | ICD-10-CM | POA: Insufficient documentation

## 2015-04-29 DIAGNOSIS — Z79899 Other long term (current) drug therapy: Secondary | ICD-10-CM | POA: Insufficient documentation

## 2015-04-29 DIAGNOSIS — Z86010 Personal history of colonic polyps: Secondary | ICD-10-CM | POA: Insufficient documentation

## 2015-04-29 DIAGNOSIS — R112 Nausea with vomiting, unspecified: Secondary | ICD-10-CM | POA: Insufficient documentation

## 2015-04-29 DIAGNOSIS — R109 Unspecified abdominal pain: Secondary | ICD-10-CM

## 2015-04-29 DIAGNOSIS — R197 Diarrhea, unspecified: Secondary | ICD-10-CM | POA: Insufficient documentation

## 2015-04-29 DIAGNOSIS — Z8719 Personal history of other diseases of the digestive system: Secondary | ICD-10-CM | POA: Insufficient documentation

## 2015-04-29 DIAGNOSIS — R1013 Epigastric pain: Secondary | ICD-10-CM | POA: Insufficient documentation

## 2015-04-29 DIAGNOSIS — R1011 Right upper quadrant pain: Secondary | ICD-10-CM | POA: Insufficient documentation

## 2015-04-29 DIAGNOSIS — Z720 Tobacco use: Secondary | ICD-10-CM | POA: Insufficient documentation

## 2015-04-29 DIAGNOSIS — Z8639 Personal history of other endocrine, nutritional and metabolic disease: Secondary | ICD-10-CM | POA: Insufficient documentation

## 2015-04-29 HISTORY — DX: Atherosclerotic heart disease of native coronary artery without angina pectoris: I25.10

## 2015-04-29 HISTORY — DX: Heart failure, unspecified: I50.9

## 2015-04-29 LAB — CBC WITH DIFFERENTIAL/PLATELET
Basophils Absolute: 0 10*3/uL (ref 0.0–0.1)
Basophils Relative: 0 % (ref 0–1)
Eosinophils Absolute: 0.1 10*3/uL (ref 0.0–0.7)
Eosinophils Relative: 1 % (ref 0–5)
HCT: 42.5 % (ref 39.0–52.0)
Hemoglobin: 14.1 g/dL (ref 13.0–17.0)
Lymphocytes Relative: 43 % (ref 12–46)
Lymphs Abs: 2.1 10*3/uL (ref 0.7–4.0)
MCH: 28.4 pg (ref 26.0–34.0)
MCHC: 33.2 g/dL (ref 30.0–36.0)
MCV: 85.5 fL (ref 78.0–100.0)
Monocytes Absolute: 0.3 10*3/uL (ref 0.1–1.0)
Monocytes Relative: 6 % (ref 3–12)
Neutro Abs: 2.5 10*3/uL (ref 1.7–7.7)
Neutrophils Relative %: 50 % (ref 43–77)
Platelets: 146 10*3/uL — ABNORMAL LOW (ref 150–400)
RBC: 4.97 MIL/uL (ref 4.22–5.81)
RDW: 14.3 % (ref 11.5–15.5)
WBC: 4.9 10*3/uL (ref 4.0–10.5)

## 2015-04-29 LAB — COMPREHENSIVE METABOLIC PANEL
ALT: 28 U/L (ref 17–63)
AST: 30 U/L (ref 15–41)
Albumin: 3.8 g/dL (ref 3.5–5.0)
Alkaline Phosphatase: 40 U/L (ref 38–126)
Anion gap: 10 (ref 5–15)
BUN: 12 mg/dL (ref 6–20)
CO2: 25 mmol/L (ref 22–32)
Calcium: 9.3 mg/dL (ref 8.9–10.3)
Chloride: 104 mmol/L (ref 101–111)
Creatinine, Ser: 1.44 mg/dL — ABNORMAL HIGH (ref 0.61–1.24)
GFR calc Af Amer: >60 mL/min (ref >60)
GFR calc non Af Amer: 54 mL/min — ABNORMAL LOW (ref >60)
Glucose, Bld: 153 mg/dL — ABNORMAL HIGH (ref 65–99)
Potassium: 3.8 mmol/L (ref 3.5–5.1)
Sodium: 139 mmol/L (ref 135–145)
Total Bilirubin: 0.3 mg/dL (ref 0.3–1.2)
Total Protein: 6.5 g/dL (ref 6.5–8.1)

## 2015-04-29 LAB — URINALYSIS, ROUTINE W REFLEX MICROSCOPIC
Bilirubin Urine: NEGATIVE
Glucose, UA: NEGATIVE mg/dL
Hgb urine dipstick: NEGATIVE
Ketones, ur: NEGATIVE mg/dL
Leukocytes, UA: NEGATIVE
Nitrite: NEGATIVE
Protein, ur: NEGATIVE mg/dL
Specific Gravity, Urine: 1.024 (ref 1.005–1.030)
Urobilinogen, UA: 1 mg/dL (ref 0.0–1.0)
pH: 6 (ref 5.0–8.0)

## 2015-04-29 LAB — LIPASE, BLOOD: Lipase: 32 U/L (ref 22–51)

## 2015-04-29 LAB — I-STAT CREATININE, ED: Creatinine, Ser: 1.4 mg/dL — ABNORMAL HIGH (ref 0.61–1.24)

## 2015-04-29 LAB — I-STAT TROPONIN, ED: Troponin i, poc: 0.02 ng/mL (ref 0.00–0.08)

## 2015-04-29 MED ORDER — IOHEXOL 300 MG/ML  SOLN
100.0000 mL | Freq: Once | INTRAMUSCULAR | Status: AC | PRN
  Administered 2015-04-29: 100 mL via INTRAVENOUS

## 2015-04-29 MED ORDER — ONDANSETRON 4 MG PO TBDP: ORAL_TABLET | ORAL | Status: DC

## 2015-04-29 MED ORDER — HYDROCODONE-ACETAMINOPHEN 5-325 MG PO TABS: 2.0000 | ORAL_TABLET | ORAL | Status: DC | PRN

## 2015-04-29 MED ORDER — SODIUM CHLORIDE 0.9 % IV BOLUS (SEPSIS)
1000.0000 mL | Freq: Once | INTRAVENOUS | Status: AC
  Administered 2015-04-29: 1000 mL via INTRAVENOUS

## 2015-04-29 MED ORDER — SUCRALFATE 1 GM/10ML PO SUSP
0.5000 g | Freq: Three times a day (TID) | ORAL | Status: DC
Start: 2015-04-29 — End: 2015-08-11

## 2015-04-29 MED ORDER — IOHEXOL 300 MG/ML  SOLN
25.0000 mL | Freq: Once | INTRAMUSCULAR | Status: AC | PRN
  Administered 2015-04-29: 25 mL via ORAL

## 2015-04-29 NOTE — ED Provider Notes (Signed)
CSN: [id number]     Arrival date & time 04/29/15  1302 History   First MD Initiated Contact with Patient 04/29/15 1341     Chief Complaint  Patient presents with  . Abdominal Pain     (Consider location/radiation/quality/duration/timing/severity/associated sxs/prior Treatment) HPI Bruce Robertson is a 54 y.o. male with a history of cardiomyopathy status post CABG 2009, history of gastric ulcer, comes in for evaluation of abdominal discomfort. Patient states for the past 2 days he has had intense epigastric and right upper quadrant pain that he characterizes as a screwdriver stuck inside him. Nothing seems to make this better or worse. No interventions tried for the discomfort. He reports associated vomiting yesterday, 4 times nonbloody nonbilious. He reports diarrhea for 2 days and reports "there may have been dark blood in the stool and on the toilet paper". He denies any fevers, chills, nausea or vomiting today. Reports decreased appetite. Discomfort is not associated with eating. Denies headache, chest pain, shortness of breath, other overt abdominal pain, urinary symptoms, numbness or weakness no other aggravating or modifying factors.  Past Medical History  Diagnosis Date  . Cardiomyopathy     Nonischemic. EF has been about 45%. S/P CABG.;  b.  Echo 4/14: EF 25%, global HK with inf and mid apical AK, restrictive physiology with E/e' > 15 (elevated LV filling pressure), trivial AI/MR, mild to mod LAE, mild RVE, mild reduced RVSF, mild RAE, mod TR, PASP 74 (severe pulmonary HTN)   . Dyslipidemia   . Tobacco abuse     Remote  . Reflux   . Ulcer     In GI tract  . Colon polyps 2002  . Hyperplastic rectal polyp   . Sleep apnea     Mild  . Coronary artery disease    Past Surgical History  Procedure Laterality Date  . Coronary artery bypass graft  2007    Had a left main dissection after catheterization. Underwent a saphenous vein graft to the LAD and a saphenous vein graft to obtuse  marginal.  . Leg tendon surgery  2005    Right patellar  . Cardiac catheterization  2007 and 2009     x 2   Family History  Problem Relation Age of Onset  . Lung cancer Other   . Cancer Other   . Hypertension Mother   . Osteoarthritis Mother   . Heart failure Father    History  Substance Use Topics  . Smoking status: Current Some Day Smoker    Last Attempt to Quit: 11/04/2004  . Smokeless tobacco: Not on file  . Alcohol Use: 7.0 oz/week    14 Standard drinks or equivalent per week     Comment: 2 per day; denies alcohol abuse. Had a previous history of failly heavy alcohol use in the remote past/ occasional    Review of Systems A 10 point review of systems was completed and was negative except for pertinent positives and negatives as mentioned in the history of present illness     Allergies  Review of patient's allergies indicates no known allergies.  Home Medications   Prior to Admission medications   Medication Sig Start Date End Date Taking? Authorizing Provider  carvedilol (COREG) 12.5 MG tablet Take 1 tablet (12.5 mg total) by mouth 2 (two) times daily with a meal. 03/25/14  Yes Minus Breeding, MD  lisinopril (PRINIVIL,ZESTRIL) 10 MG tablet TAKE ONE TABLET BY MOUTH TWICE DAILY 07/15/14  Yes Minus Breeding, MD  Multiple Vitamin (MULTIVITAMIN) tablet  Take 1 tablet by mouth daily.     Yes Historical Provider, MD  acetaminophen (TYLENOL) 500 MG tablet Take 1,000 mg by mouth every 6 (six) hours as needed for mild pain.     Historical Provider, MD  gabapentin (NEURONTIN) 300 MG capsule Take 1 capsule (300 mg total) by mouth 3 (three) times daily. Patient not taking: Reported on 04/29/2015 11/21/14   Melony Overly, MD  methylPREDNIsolone (MEDROL [facility]) 4 MG tablet follow package directions Patient not taking: Reported on 04/29/2015 11/02/14   Lutricia Feil, PA  oxyCODONE-acetaminophen (PERCOCET/ROXICET) 5-325 MG per tablet Take 2 tablets by mouth every 4 (four) hours as  needed for severe pain. Patient not taking: Reported on 04/29/2015 11/19/14   Melony Overly, MD  traMADol (ULTRAM) 50 MG tablet Take 1 tablet (50 mg total) by mouth 2 (two) times daily. Patient not taking: Reported on 04/29/2015 11/24/14   Carlos Levering Draper, DO   BP 107/61 mmHg  Pulse 59  Temp(Src) 97.7 F (36.5 C) (Oral)  Resp 14  Ht 6\' 1"  (1.854 m)  Wt 251 lb 2 oz (113.91 kg)  BMI 33.14 kg/m2  SpO2 97% Physical Exam  Constitutional: He is oriented to person, place, and time. He appears well-developed and well-nourished.  HENT:  Head: Normocephalic and atraumatic.  Mouth/Throat: Oropharynx is clear and moist.  Eyes: Conjunctivae are normal. Pupils are equal, round, and reactive to light. Right eye exhibits no discharge. Left eye exhibits no discharge. No scleral icterus.  Neck: Neck supple.  Cardiovascular: Normal rate, regular rhythm and normal heart sounds.   Pulmonary/Chest: Effort normal and breath sounds normal. No respiratory distress. He has no wheezes. He has no rales.  Abdominal: Soft. There is no tenderness.  Epigastric and right upper quadrant are exquisitely tender to palpation. There is also a soft, mobile mass noted to the axillary line around ribs 9 and 10 on the right. Nontender and nonerythematous.  Musculoskeletal: He exhibits no tenderness.  Neurological: He is alert and oriented to person, place, and time.  Cranial Nerves II-XII grossly intact  Skin: Skin is warm and dry. No rash noted.  Psychiatric: He has a normal mood and affect.  Nursing note and vitals reviewed.   ED Course  Procedures (including critical care time) Labs Review Labs Reviewed  URINALYSIS, ROUTINE W REFLEX MICROSCOPIC (NOT AT [facility])  LIPASE, BLOOD  COMPREHENSIVE METABOLIC PANEL  CBC WITH DIFFERENTIAL/PLATELET  I-STAT TROPOININ, ED  I-STAT CREATININE, ED    Imaging Review No results found.   EKG Interpretation None     Meds given in ED:  Medications  iohexol (OMNIPAQUE) 300 MG/ML  solution 25 mL (25 mLs Oral Contrast Given 04/29/15 1435)  sodium chloride 0.9 % bolus 1,000 mL (1,000 mLs Intravenous New Bag/Given 04/29/15 1530)    New Prescriptions   No medications on file   Filed Vitals:   04/29/15 1322 04/29/15 1400 04/29/15 1429 04/29/15 1430  BP: 107/61 92/52 92/52  98/51  Pulse: 59 61 51 49  Temp: 97.7 F (36.5 C)     TempSrc: Oral     Resp: 14  20   Height: 6\' 1"  (1.854 m)     Weight: 251 lb 2 oz (113.91 kg)     SpO2: 97% 96% 99% 98%    MDM  Vitals stable with mild hypotension.-afebrile. Reassess after IV fluids. Pt resting comfortably in ED. PE--patient with abdominal discomfort and right upper quadrant and epigastrium. Labwork--labs not concerning. Creatinine is baseline. Pending urinalysis. Imaging--pending  CT abdomen   DDX--due to patient's intense abdominal discomfort on palpation with no obvious source, will obtain CT of his abdomen. Doubt Cardiac etiology Patient care signed out to [facility], PA-C. Plan is to follow-up on CT abdomen imaging and reassess the patient after pain medicines and IV fluids. If no new objective findings, I feel patient is stable to be discharged home with pain medicines and prompt follow-up with primary care for further evaluation and management of symptoms. Patient stable, in good condition and is appropriate for signout.    Final diagnoses:  Abdominal discomfort  Nausea vomiting and diarrhea        Comer Locket, PA-C 04/30/15 3007  Davonna Belling, MD 04/30/15 1401

## 2015-04-29 NOTE — ED Notes (Signed)
PT is here with upper abdominal pain for a couple of days and vomited about 4 times and knot to right lateral side.  Pt reports diarrhea with some blood in stool and thinks it maybe dark red.  History of ulcers

## 2015-04-29 NOTE — ED Provider Notes (Signed)
Patient care acquired from [facility], PA-C pending CT scan results.  Results for orders placed or performed during the hospital encounter of 04/29/15  Urinalysis, Routine w reflex microscopic (not at [facility])  Result Value Ref Range   Color, Urine YELLOW YELLOW   APPearance CLEAR CLEAR   Specific Gravity, Urine 1.024 1.005 - 1.030   pH 6.0 5.0 - 8.0   Glucose, UA NEGATIVE NEGATIVE mg/dL   Hgb urine dipstick NEGATIVE NEGATIVE   Bilirubin Urine NEGATIVE NEGATIVE   Ketones, ur NEGATIVE NEGATIVE mg/dL   Protein, ur NEGATIVE NEGATIVE mg/dL   Urobilinogen, UA 1.0 0.0 - 1.0 mg/dL   Nitrite NEGATIVE NEGATIVE   Leukocytes, UA NEGATIVE NEGATIVE  Lipase, blood  Result Value Ref Range   Lipase 32 22 - 51 U/L  Comprehensive metabolic panel  Result Value Ref Range   Sodium 139 135 - 145 mmol/L   Potassium 3.8 3.5 - 5.1 mmol/L   Chloride 104 101 - 111 mmol/L   CO2 25 22 - 32 mmol/L   Glucose, Bld 153 (H) 65 - 99 mg/dL   BUN 12 6 - 20 mg/dL   Creatinine, Ser 1.44 (H) 0.61 - 1.24 mg/dL   Calcium 9.3 8.9 - 10.3 mg/dL   Total Protein 6.5 6.5 - 8.1 g/dL   Albumin 3.8 3.5 - 5.0 g/dL   AST 30 15 - 41 U/L   ALT 28 17 - 63 U/L   Alkaline Phosphatase 40 38 - 126 U/L   Total Bilirubin 0.3 0.3 - 1.2 mg/dL   GFR calc non Af Amer 54 (L) >60 mL/min   GFR calc Af Amer >60 >60 mL/min   Anion gap 10 5 - 15  CBC with Differential  Result Value Ref Range   WBC 4.9 4.0 - 10.5 K/uL   RBC 4.97 4.22 - 5.81 MIL/uL   Hemoglobin 14.1 13.0 - 17.0 g/dL   HCT 42.5 39.0 - 52.0 %   MCV 85.5 78.0 - 100.0 fL   MCH 28.4 26.0 - 34.0 pg   MCHC 33.2 30.0 - 36.0 g/dL   RDW 14.3 11.5 - 15.5 %   Platelets 146 (L) 150 - 400 K/uL   Neutrophils Relative % 50 43 - 77 %   Neutro Abs 2.5 1.7 - 7.7 K/uL   Lymphocytes Relative 43 12 - 46 %   Lymphs Abs 2.1 0.7 - 4.0 K/uL   Monocytes Relative 6 3 - 12 %   Monocytes Absolute 0.3 0.1 - 1.0 K/uL   Eosinophils Relative 1 0 - 5 %   Eosinophils Absolute 0.1 0.0 - 0.7 K/uL    Basophils Relative 0 0 - 1 %   Basophils Absolute 0.0 0.0 - 0.1 K/uL  I-stat troponin, ED (only if pt is 52 y.o. or older & pain is above umbilicus)  not at [facility]  Result Value Ref Range   Troponin i, poc 0.02 0.00 - 0.08 ng/mL   Comment 3          I-stat Creatinine, ED  Result Value Ref Range   Creatinine, Ser 1.40 (H) 0.61 - 1.24 mg/dL   Ct Abdomen Pelvis W Contrast  04/29/2015   CLINICAL DATA:  Upper abdominal pain for several days.  EXAM: CT ABDOMEN AND PELVIS WITH CONTRAST  TECHNIQUE: Multidetector CT imaging of the abdomen and pelvis was performed using the standard protocol following bolus administration of intravenous contrast.  CONTRAST:  132mL OMNIPAQUE IOHEXOL 300 MG/ML  SOLN  COMPARISON:  None  FINDINGS: Lower  chest: There is no pleural effusion identified. Lung bases appear clear.  Hepatobiliary: No suspicious liver abnormality identified. The gallbladder is normal. No biliary dilatation.  Pancreas: Negative  Spleen: Negative  Adrenals/Urinary Tract: Normal adrenal glands. Small renal cysts noted. No evidence for nephrolithiasis or hydronephrosis. The bladder is negative.  Stomach/Bowel: The stomach is within normal limits. The small bowel loops have a normal course and caliber. No obstruction. Normal appearance of the colon. The appendix is visualized and appears normal.  Vascular/Lymphatic: Calcified atherosclerotic disease involves the abdominal aorta. No aneurysm. No enlarged retroperitoneal or mesenteric adenopathy. No enlarged pelvic or inguinal lymph nodes.  Reproductive: The prostate gland and seminal vesicles are unremarkable.  Other: There is no ascites or focal fluid collections within the abdomen or pelvis.  Musculoskeletal: The visualized bony structures is negative for aggressive lytic or sclerotic bone lesion. There is degenerative disc disease noted at the L5-S1 level.  IMPRESSION: 1. No acute findings identified within the abdomen or pelvis. 2. Aortic atherosclerosis.    Electronically Signed   By: Kerby Moors M.D.   On: 04/29/2015 16:01     1. Abdominal discomfort   2. Nausea vomiting and diarrhea     I personally reviewed the imaging and agree with the radiologist.   Patient is nontoxic, nonseptic appearing, in no apparent distress.  Patient's pain and other symptoms adequately managed in emergency department. Labs, imaging and vitals reviewed.  Patient does not meet the SIRS or Sepsis criteria.  On repeat exam patient does not have a surgical abdomen and there are no peritoneal signs.  Patient discharged home with symptomatic treatment and given strict instructions for follow-up with their primary care physician and GI.  I have also discussed reasons to return immediately to the ER.  Patient expresses understanding and agrees with plan. Patient is stable at time of discharge      Baron Sane, PA-C 04/29/15 [place], MD 04/29/15 2140

## 2015-04-29 NOTE — Discharge Instructions (Signed)
You were evaluated in the ED today for your abdominal pain. There does not appear to be caused her symptoms at this time. Your exam, labwork and CT scan of your abdomen are reassuring. Please take your medications as needed for pain and nausea. Please follow-up with your primary care for further evaluation and management of symptoms. Return to ED for worse  Abdominal Pain Many things can cause abdominal pain. Usually, abdominal pain is not caused by a disease and will improve without treatment. It can often be observed and treated at home. Your health care provider will do a physical exam and possibly order blood tests and X-rays to help determine the seriousness of your pain. However, in many cases, more time must pass before a clear cause of the pain can be found. Before that point, your health care provider may not know if you need more testing or further treatment. HOME CARE INSTRUCTIONS  Monitor your abdominal pain for any changes. The following actions may help to alleviate any discomfort you are experiencing:  Only take over-the-counter or prescription medicines as directed by your health care provider.  Do not take laxatives unless directed to do so by your health care provider.  Try a clear liquid diet (broth, tea, or water) as directed by your health care provider. Slowly move to a bland diet as tolerated. SEEK MEDICAL CARE IF:  You have unexplained abdominal pain.  You have abdominal pain associated with nausea or diarrhea.  You have pain when you urinate or have a bowel movement.  You experience abdominal pain that wakes you in the night.  You have abdominal pain that is worsened or improved by eating food.  You have abdominal pain that is worsened with eating fatty foods.  You have a fever. SEEK IMMEDIATE MEDICAL CARE IF:   Your pain does not go away within 2 hours.  You keep throwing up (vomiting).  Your pain is felt only in portions of the abdomen, such as the right  side or the left lower portion of the abdomen.  You pass bloody or black tarry stools. MAKE SURE YOU:  Understand these instructions.   Will watch your condition.   Will get help right away if you are not doing well or get worse.  Document Released: 07/31/2005 Document Revised: 10/26/2013 Document Reviewed: 06/30/2013 [facility] Patient Information 2015 [place], [place]. This information is not intended to replace advice given to you by your health care provider. Make sure you discuss any questions you have with your health care provider.

## 2015-08-11 ENCOUNTER — Emergency Department (HOSPITAL_COMMUNITY)
Admission: EM | Admit: 2015-08-11 | Discharge: 2015-08-11 | Disposition: A | Discharge status: Home / Self Care | Payer: Medicaid Other | Attending: Emergency Medicine | Admitting: Emergency Medicine

## 2015-08-11 ENCOUNTER — Other Ambulatory Visit: Payer: Self-pay | Admitting: Cardiology

## 2015-08-11 ENCOUNTER — Telehealth: Payer: Self-pay | Admitting: Cardiology

## 2015-08-11 ENCOUNTER — Encounter (HOSPITAL_COMMUNITY): Payer: Self-pay | Admitting: Emergency Medicine

## 2015-08-11 ENCOUNTER — Emergency Department (HOSPITAL_COMMUNITY): Payer: Medicaid Other

## 2015-08-11 DIAGNOSIS — K633 Ulcer of intestine: Secondary | ICD-10-CM | POA: Insufficient documentation

## 2015-08-11 DIAGNOSIS — Z9889 Other specified postprocedural states: Secondary | ICD-10-CM | POA: Insufficient documentation

## 2015-08-11 DIAGNOSIS — Z951 Presence of aortocoronary bypass graft: Secondary | ICD-10-CM | POA: Insufficient documentation

## 2015-08-11 DIAGNOSIS — M546 Pain in thoracic spine: Secondary | ICD-10-CM | POA: Diagnosis not present

## 2015-08-11 DIAGNOSIS — Z8639 Personal history of other endocrine, nutritional and metabolic disease: Secondary | ICD-10-CM | POA: Diagnosis not present

## 2015-08-11 DIAGNOSIS — I2510 Atherosclerotic heart disease of native coronary artery without angina pectoris: Secondary | ICD-10-CM | POA: Diagnosis not present

## 2015-08-11 DIAGNOSIS — R1110 Vomiting, unspecified: Secondary | ICD-10-CM | POA: Diagnosis not present

## 2015-08-11 DIAGNOSIS — R2243 Localized swelling, mass and lump, lower limb, bilateral: Secondary | ICD-10-CM | POA: Insufficient documentation

## 2015-08-11 DIAGNOSIS — Z720 Tobacco use: Secondary | ICD-10-CM | POA: Insufficient documentation

## 2015-08-11 DIAGNOSIS — Z79899 Other long term (current) drug therapy: Secondary | ICD-10-CM | POA: Insufficient documentation

## 2015-08-11 DIAGNOSIS — Z8669 Personal history of other diseases of the nervous system and sense organs: Secondary | ICD-10-CM | POA: Diagnosis not present

## 2015-08-11 DIAGNOSIS — Z86010 Personal history of colonic polyps: Secondary | ICD-10-CM | POA: Diagnosis not present

## 2015-08-11 DIAGNOSIS — K219 Gastro-esophageal reflux disease without esophagitis: Secondary | ICD-10-CM | POA: Diagnosis not present

## 2015-08-11 DIAGNOSIS — I509 Heart failure, unspecified: Secondary | ICD-10-CM | POA: Diagnosis not present

## 2015-08-11 DIAGNOSIS — R0602 Shortness of breath: Secondary | ICD-10-CM | POA: Diagnosis present

## 2015-08-11 LAB — BASIC METABOLIC PANEL
Anion gap: 11 (ref 5–15)
BUN: 16 mg/dL (ref 6–20)
CO2: 24 mmol/L (ref 22–32)
Calcium: 9.2 mg/dL (ref 8.9–10.3)
Chloride: 100 mmol/L — ABNORMAL LOW (ref 101–111)
Creatinine, Ser: 1.42 mg/dL — ABNORMAL HIGH (ref 0.61–1.24)
GFR calc Af Amer: >60 mL/min (ref >60)
GFR calc non Af Amer: 55 mL/min — ABNORMAL LOW (ref >60)
Glucose, Bld: 132 mg/dL — ABNORMAL HIGH (ref 65–99)
Potassium: 3.9 mmol/L (ref 3.5–5.1)
Sodium: 135 mmol/L (ref 135–145)

## 2015-08-11 LAB — CBC
HCT: 43.4 % (ref 39.0–52.0)
Hemoglobin: 14.1 g/dL (ref 13.0–17.0)
MCH: 28.1 pg (ref 26.0–34.0)
MCHC: 32.5 g/dL (ref 30.0–36.0)
MCV: 86.5 fL (ref 78.0–100.0)
Platelets: 173 10*3/uL (ref 150–400)
RBC: 5.02 MIL/uL (ref 4.22–5.81)
RDW: 14.3 % (ref 11.5–15.5)
WBC: 4.9 10*3/uL (ref 4.0–10.5)

## 2015-08-11 LAB — I-STAT TROPONIN, ED: Troponin i, poc: 0.02 ng/mL (ref 0.00–0.08)

## 2015-08-11 LAB — BRAIN NATRIURETIC PEPTIDE: B Natriuretic Peptide: 347.7 pg/mL — ABNORMAL HIGH (ref 0.0–100.0)

## 2015-08-11 MED ORDER — FUROSEMIDE 20 MG PO TABS
40.0000 mg | ORAL_TABLET | Freq: Once | ORAL | Status: AC
  Administered 2015-08-11: 40 mg via ORAL
  Filled 2015-08-11: qty 2

## 2015-08-11 MED ORDER — FUROSEMIDE 40 MG PO TABS: 40.0000 mg | ORAL_TABLET | Freq: Every day | ORAL | Status: DC

## 2015-08-11 MED ORDER — IPRATROPIUM-ALBUTEROL 0.5-2.5 (3) MG/3ML IN SOLN
3.0000 mL | Freq: Once | RESPIRATORY_TRACT | Status: AC
  Administered 2015-08-11: 3 mL via RESPIRATORY_TRACT
  Filled 2015-08-11: qty 3

## 2015-08-11 NOTE — Discharge Instructions (Signed)

## 2015-08-11 NOTE — ED Notes (Signed)
Pt. reports productive cough with SOB and chest tightness onset last week , denies fever.

## 2015-08-11 NOTE — ED Notes (Signed)
Lab to add on BNP.  °

## 2015-08-11 NOTE — ED Notes (Signed)
Ambulated pt around Pod D with no difficulty. Pt's sats started at 100% they were at 98% once we got back to the room.

## 2015-08-11 NOTE — Telephone Encounter (Signed)
We can see if the patient can be added to an APP schedule.  He has had many cancellations and no shows.  We have had long discussions about his poor prognosis because of this.  We need to work out a plan for compliance with our appointments.

## 2015-08-11 NOTE — Telephone Encounter (Signed)
Returned call to patient. Seen for congestion, SOB in ER yesterday.  He states still feels short of breath - wanted to know if he could be seen sooner than 10/25. Was d/c'ed from ER w/ Rx for 2 doses of furosemide 40mg  to take daily next 2 days - has not filled this yet.  Explained rationale for furosemide. Advised to fill Rx per ER physician's recommendation, see if improved. Asked patient to call back if not better - can add for flex visit if still issues.  Pt also requested refill for his other meds - this was done while on the phone, pt aware these were sent to pharmacy.  Will route to Dr. [name] for any additional advice.

## 2015-08-11 NOTE — ED Notes (Signed)
Pt. Left with all belongings and refused wheelchair. Discharge instructions were reviewed and all questions were answered.  

## 2015-08-11 NOTE — ED Provider Notes (Signed)
History  By signing my name below, I, Marlowe Kays, attest that this documentation has been prepared under the direction and in the presence of Ripley Fraise, MD. Electronically Signed: Marlowe Kays, ED Scribe. 08/11/2015. 1:21 AM.  Chief Complaint  Patient presents with  . Shortness of Breath  . Cough   The history is provided by the patient and medical records. No language interpreter was used.    HPI Comments:  Bruce Robertson is a 54 y.o. male who presents to the Emergency Department complaining of SOB with associated orthopnea that has been ongoing for the past three weeks. He states he feels as if someone is standing on his chest and it is difficult for him to take a deep breath. Pt reports associated productive cough of thick, white mucous. He reports post-tussive vomiting, bilateral ankle swelling and mild mid upper back pain at the base of his neck. He has not done anything to treat his symptoms. Lying down increases the symptoms and he states they are worse at night. He denies alleviating factors. He denies fever, chills, nausea, CP. 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 He reports his cardiologist is Dr. [name]. He reports having a CABG done nine years ago. He denies current tobacco use stating he quit approximately 6 months ago but reports smoking an occasional Black and Mild cigar.  Past Medical History  Diagnosis Date  . Cardiomyopathy     Nonischemic. EF has been about 45%. S/P CABG.;  b.  Echo 4/14: EF 25%, global HK with inf and mid apical AK, restrictive physiology with E/e' > 15 (elevated LV filling pressure), trivial AI/MR, mild to mod LAE, mild RVE, mild reduced RVSF, mild RAE, mod TR, PASP 74 (severe pulmonary HTN)   . Dyslipidemia   . Tobacco abuse     Remote  . Reflux   . Ulcer     In GI tract  . Colon polyps 2002  . Hyperplastic rectal polyp   . Sleep apnea     Mild  . Coronary artery disease   . CHF (congestive  heart failure) [facility])    Past Surgical History  Procedure Laterality Date  . Coronary artery bypass graft  2007    Had a left main dissection after catheterization. Underwent a saphenous vein graft to the LAD and a saphenous vein graft to obtuse marginal.  . Leg tendon surgery  2005    Right patellar  . Cardiac catheterization  2007 and 2009     x 2   Family History  Problem Relation Age of Onset  . Lung cancer Other   . Cancer Other   . Hypertension Mother   . Osteoarthritis Mother   . Heart failure Father    Social History  Substance Use Topics  . Smoking status: Current Some Day Smoker    Last Attempt to Quit: 11/04/2004  . Smokeless tobacco: None  . Alcohol Use: Yes    Review of Systems  Constitutional: Negative for fever and chills.  Respiratory: Positive for cough and shortness of breath.   Cardiovascular: Positive for leg swelling. Negative for chest pain.  Gastrointestinal: Positive for vomiting (post-tussive). Negative for nausea.  Musculoskeletal: Positive for back pain.  All other systems reviewed and are negative.   Allergies  Review of patient's allergies indicates no known allergies.  Home Medications   Prior to Admission medications   Medication Sig Start Date End Date Taking? Authorizing Provider  acetaminophen (TYLENOL) 500  MG tablet Take 1,000 mg by mouth every 6 (six) hours as needed for mild pain.     Historical Provider, MD  carvedilol (COREG) 12.5 MG tablet Take 1 tablet (12.5 mg total) by mouth 2 (two) times daily with a meal. 03/25/14   Minus Breeding, MD  gabapentin (NEURONTIN) 300 MG capsule Take 1 capsule (300 mg total) by mouth 3 (three) times daily. Patient not taking: Reported on 04/29/2015 11/21/14   Melony Overly, MD  HYDROcodone-acetaminophen [facility]) 5-325 MG per tablet Take 2 tablets by mouth every 4 (four) hours as needed. 04/29/15   Comer Locket, PA-C  lisinopril (PRINIVIL,ZESTRIL) 10 MG tablet TAKE ONE TABLET BY MOUTH TWICE DAILY  07/15/14   Minus Breeding, MD  methylPREDNIsolone (MEDROL DOSPACK) 4 MG tablet follow package directions Patient not taking: Reported on 04/29/2015 11/02/14   Lutricia Feil, PA  Multiple Vitamin (MULTIVITAMIN) tablet Take 1 tablet by mouth daily.      Historical Provider, MD  ondansetron (ZOFRAN ODT) 4 MG disintegrating tablet 4mg  ODT q4 hours prn nausea/vomit 04/29/15   Comer Locket, PA-C  oxyCODONE-acetaminophen (PERCOCET/ROXICET) 5-325 MG per tablet Take 2 tablets by mouth every 4 (four) hours as needed for severe pain. Patient not taking: Reported on 04/29/2015 11/19/14   Melony Overly, MD  sucralfate (CARAFATE) 1 GM/10ML suspension Take 5 mLs (0.5 g total) by mouth 4 (four) times daily -  with meals and at bedtime. 04/29/15   Jennifer Piepenbrink, PA-C  traMADol (ULTRAM) 50 MG tablet Take 1 tablet (50 mg total) by mouth 2 (two) times daily. Patient not taking: Reported on 04/29/2015 11/24/14   Thurman Coyer, DO   Triage Vitals: BP 149/97 mmHg  Pulse 77  Temp(Src) 98 F (36.7 C) (Oral)  Resp 20  Ht 6\' 1"  (1.854 m)  Wt 238 lb (107.956 kg)  BMI 31.41 kg/m2  SpO2 96%  Physical Exam  CONSTITUTIONAL: Well developed/well nourished HEAD: Normocephalic/atraumatic EYES: EOMI/PERRL ENMT: Mucous membranes moist NECK: supple no meningeal signs; positive for JVD SPINE/BACK:entire spine nontender CV: S1/S2 noted, no murmurs/rubs/gallops noted LUNGS: decreased breath sound bilaterally with scattered wheezing ABDOMEN: soft, nontender, no rebound or guarding, bowel sounds noted throughout abdomen GU:no cva tenderness NEURO: Pt is awake/alert/appropriate, moves all extremitiesx4.  No facial droop.   EXTREMITIES: pulses normal/equal, full ROM; pitting edema to bilateral lower extremities SKIN: warm, color normal PSYCH: no abnormalities of mood noted, alert and oriented to situation   ED Course  Procedures  DIAGNOSTIC STUDIES: Oxygen Saturation is 96% on RA, adequate by my  interpretation.   COORDINATION OF CARE: 1:10 AM- Will order CXR, nebulizer treatment and labs. Pt verbalizes understanding and agrees to plan. 2:58 AM D/w dr Jules Husbands with cardiology We discussed history/exam, probable mild CHF Recommends starting lasix 40mg  daily for next 2 days, then close cardiology f/u next week  Pt well appearing He felt well on ambulation No hypoxia noted Denies CP Given history/exam, suspect mild CHF exacerbation Will add on lasix Advised need to see cardiology next week We discussed strict return precautions BP 114/90 mmHg  Pulse 67  Temp(Src) 98 F (36.7 C) (Oral)  Resp 16  Ht 6\' 1"  (1.854 m)  Wt 238 lb (107.956 kg)  BMI 31.41 kg/m2  SpO2 100%   Medications  ipratropium-albuterol (DUONEB) 0.5-2.5 (3) MG/3ML nebulizer solution 3 mL (3 mLs Nebulization Given 08/11/15 0125)  furosemide (LASIX) tablet 40 mg (40 mg Oral Given 08/11/15 0307)    Labs Review Labs Reviewed  BASIC METABOLIC  PANEL - Abnormal; Notable for the following:    Chloride 100 (*)    Glucose, Bld 132 (*)    Creatinine, Ser 1.42 (*)    GFR calc non Af Amer 55 (*)    All other components within normal limits  BRAIN NATRIURETIC PEPTIDE - Abnormal; Notable for the following:    B Natriuretic Peptide 347.7 (*)    All other components within normal limits  CBC  I-STAT TROPOININ, ED    Imaging Review Dg Chest 2 View  08/11/2015   CLINICAL DATA:  Cough and dyspnea for 2 days  EXAM: CHEST  2 VIEW  COMPARISON:  06/02/2011  FINDINGS: Minimal linear basilar opacities on the left may represent a degree of atelectasis or early infiltrate but this is accentuated by a shallow degree of inspiration. The right lung is clear. There is no pleural effusion. The pulmonary vasculature is normal. Heart size is borderline, unchanged.  IMPRESSION: Minimal linear basilar opacities on the left.   Electronically Signed   By: Andreas Newport M.D.   On: 08/11/2015 01:10   I have personally reviewed and  evaluated these images and lab results as part of my medical decision-making.   EKG Interpretation   Date/Time:  Friday August 11 2015 00:11:12 EDT Ventricular Rate:  80 PR Interval:  136 QRS Duration: 94 QT Interval:  428 QTC Calculation: 493 R Axis:   106 Text Interpretation:  Sinus rhythm with occasional Premature ventricular  complexes Right atrial enlargement Rightward axis Pulmonary disease  pattern Nonspecific T wave abnormality Abnormal ECG Confirmed by Christy Gentles   MD, Elenore Rota ([id number]) on 08/11/2015 1:03:00 AM      MDM   Final diagnoses:  Acute congestive heart failure, unspecified congestive heart failure type [facility])    Nursing notes including past medical history and social history reviewed and considered in documentation xrays/imaging reviewed by myself and considered during evaluation Labs/vital reviewed myself and considered during evaluation   I, Sharyon Cable, personally performed the services described in this documentation. All medical record entries made by the scribe were at my direction and in my presence.  I have reviewed the chart and discharge instructions and agree that the record reflects my personal performance and is accurate and complete. Sharyon Cable.  08/11/2015. 3:26 AM.        Ripley Fraise, MD 08/11/15 [phone]

## 2015-08-11 NOTE — Telephone Encounter (Signed)
New Message  Pt called to speak w/ RN concerning recent ED stay. Pt sched EPH appt w/ Luke on 10/25 @ 1130. Per AVS- was to call Dr Hochrein's office. Pt wanted to know if he should come in sooner. Please call back and discuss.

## 2015-08-14 NOTE — Telephone Encounter (Signed)
Left message for patient to call.

## 2015-08-15 ENCOUNTER — Encounter: Payer: Self-pay | Admitting: Physician Assistant

## 2015-08-15 ENCOUNTER — Ambulatory Visit (INDEPENDENT_AMBULATORY_CARE_PROVIDER_SITE_OTHER): Payer: Self-pay | Admitting: Physician Assistant

## 2015-08-15 VITALS — BP 112/74 | HR 62 | Ht 73.0 in | Wt 236.0 lb

## 2015-08-15 DIAGNOSIS — J189 Pneumonia, unspecified organism: Secondary | ICD-10-CM

## 2015-08-15 DIAGNOSIS — I429 Cardiomyopathy, unspecified: Secondary | ICD-10-CM

## 2015-08-15 DIAGNOSIS — I5022 Chronic systolic (congestive) heart failure: Secondary | ICD-10-CM

## 2015-08-15 DIAGNOSIS — I10 Essential (primary) hypertension: Secondary | ICD-10-CM

## 2015-08-15 DIAGNOSIS — I428 Other cardiomyopathies: Secondary | ICD-10-CM

## 2015-08-15 DIAGNOSIS — R634 Abnormal weight loss: Secondary | ICD-10-CM

## 2015-08-15 DIAGNOSIS — K625 Hemorrhage of anus and rectum: Secondary | ICD-10-CM

## 2015-08-15 LAB — BASIC METABOLIC PANEL
BUN: 21 mg/dL (ref 7–25)
CO2: 27 mmol/L (ref 20–31)
Calcium: 9.5 mg/dL (ref 8.6–10.3)
Chloride: 101 mmol/L (ref 98–110)
Creat: 1.58 mg/dL — ABNORMAL HIGH (ref 0.70–1.33)
Glucose, Bld: 96 mg/dL (ref 65–99)
Potassium: 4.2 mmol/L (ref 3.5–5.3)
Sodium: 138 mmol/L (ref 135–146)

## 2015-08-15 MED ORDER — DOXYCYCLINE HYCLATE 100 MG PO CAPS: 100.0000 mg | ORAL_CAPSULE | Freq: Two times a day (BID) | ORAL | Status: DC

## 2015-08-15 MED ORDER — AMOXICILLIN 500 MG PO CAPS: 1000.0000 mg | ORAL_CAPSULE | Freq: Three times a day (TID) | ORAL | Status: AC

## 2015-08-15 MED ORDER — FUROSEMIDE 20 MG PO TABS
20.0000 mg | ORAL_TABLET | Freq: Every day | ORAL | Status: DC
Start: 2015-08-15 — End: 2015-08-16

## 2015-08-15 NOTE — Patient Instructions (Addendum)
Medication Instructions:  1. START LASIX 20 MG DAILY  Labwork: 1. TODAY BMET, BNP  2. REPEAT BMET IN 1 WEEK  Testing/Procedures: Your physician has requested that you have an echocardiogram THIS WILL NEED TO BE DONE A FEW DAYS BEFORE APPT WITH LUKE Hayfield IS ON 08/29/15. Echocardiography is a painless test that uses sound waves to create images of your heart. It provides your doctor with information about the size and shape of your heart and how well your heart's chambers and valves are working. This procedure takes approximately one hour. There are no restrictions for this procedure.   Follow-Up: KEEP YOUR APPT WITH Kerin Ransom 08/29/15  Any Other Special Instructions Will Be Listed Below (If Applicable).  YOU ARE BEING REFERRED TO PRIMARY CARE

## 2015-08-15 NOTE — Progress Notes (Signed)
Cardiology Office Note   Date:  08/15/2015   ID:  Bruce Robertson, DOB 07/25/1961, MRN [id number]  PCP:  No PCP Per Patient  Cardiologist:  Dr. Minus Robertson   Electrophysiologist:  n/a  No chief complaint on file.    History of Present Illness: Bruce Robertson is a 54 y.o. male with a hx of nonischemic cardiomyopathy, HTN, HL, GERD, OSA. Cardiac catheterization in 3846 was complicated by left main dissection requiring urgent bypass surgery. Cardiac catheterization in 2008 demonstrated normal arteries and 2/2 patent bypass grafts. Prior EF 40%. He underwent ETT for DOT physical in 2014 demonstrating significant ectopy. He had stopped all of his CHF medications for about a year. His follow-up echocardiogram demonstrated worsening LV function with an EF of 25%. His medications were resumed. Last seen by Dr. Percival Robertson 5/15.  Records indicate he has had many cancellations and no shows.    Patient was seen in emergency room 10/7 with symptoms of acute CHF. Chest x-ray was clear. BNP was minimally elevated. He was started on Lasix asked to follow-up in clinic. He has an appointment 10/25 already scheduled. However, he walked in to the office today and asked to be added on to my schedule.  He is here with his wife. He did get the Lasix filled and took it for 2 days.  He feels better but is still short of breath. He notes dyspnea while seated at times as well as with mod activities.  Overall he is NYHA 2b.  He denies syncope.  He has noted 4-5 pillow orthopnea (usually sleeps on 3 pillows) and PND.  He notes L sided chest tingling at times. This is not related to exertion.  He has some symptoms in his L arm. He notes a cough for 2 weeks with whitish sputum.  No hemoptysis. He has noted some hemorrhoidal bleeding (2 weeks ago).  Denies fever.  He is an ex-smoker. He works as a Administrator. The EDP told him to remain out of work until seen in [place].     Studies/Reports Reviewed Today:  Dg Chest 2  View  08/11/2015   IMPRESSION: Minimal linear basilar opacities on the left.   Electronically Signed   By: Bruce Robertson M.D.   On: 08/11/2015 01:10    Exercise stress test 4/14 ETT Interpretation: normal - no evidence of ischemia by ST analysis; + NSVT  Echocardiogram 4/14 EF 25%, prominent apical trabeculation, inferior and mid to apical posterior wall akinesis, restrictive physiology, trivial AI, trivial MR, mild to moderate LAE, mild RVE, mildly reduced RVSF, mild RAE, moderate TR, PASP 74 mmHg  Myoview 1/13 Abnormal stress nuclear study with large defects in the anterolateral and inferior walls consistent with prior infarct; mild reversibility in the anterolateral wall consistent with very mild peri-infarct ischemia.  LHC 11/2006 LM: Normal LAD: Normal LCx: Normal RCA: Normal SVG-OM1 normal SVG-LAD patent 25% stenosis   Past Medical History  Diagnosis Date  . Cardiomyopathy     Nonischemic. EF has been about 45%. S/P CABG.;  b.  Echo 4/14: EF 25%, global HK with inf and mid apical AK, restrictive physiology with E/e' > 15 (elevated LV filling pressure), trivial AI/MR, mild to mod LAE, mild RVE, mild reduced RVSF, mild RAE, mod TR, PASP 74 (severe pulmonary HTN)   . Dyslipidemia   . Tobacco abuse     Remote  . Reflux   . Ulcer     In GI tract  . Colon polyps 2002  . Hyperplastic  rectal polyp   . Sleep apnea     Mild  . Coronary artery disease   . CHF (congestive heart failure) [facility])     Past Surgical History  Procedure Laterality Date  . Coronary artery bypass graft  2007    Had a left main dissection after catheterization. Underwent a saphenous vein graft to the LAD and a saphenous vein graft to obtuse marginal.  . Leg tendon surgery  2005    Right patellar  . Cardiac catheterization  2007 and 2009     x 2     Current Outpatient Prescriptions  Medication Sig Dispense Refill  . carvedilol (COREG) 12.5 MG tablet TAKE ONE TABLET BY MOUTH TWICE DAILY WITH MEALS  60 tablet 2  . lisinopril (PRINIVIL,ZESTRIL) 10 MG tablet TAKE ONE TABLET BY MOUTH TWICE DAILY 60 tablet 2  . amoxicillin (AMOXIL) 500 MG capsule Take 2 capsules (1,000 mg total) by mouth 3 (three) times daily. 42 capsule 0  . doxycycline (VIBRAMYCIN) 100 MG capsule Take 1 capsule (100 mg total) by mouth 2 (two) times daily. 14 capsule 0  . furosemide (LASIX) 20 MG tablet Take 1 tablet (20 mg total) by mouth daily. 30 tablet 11   No current facility-administered medications for this visit.    Allergies:   Review of patient's allergies indicates no known allergies.    Social History:  The patient  reports that he has been smoking.  He does not have any smokeless tobacco history on file. He reports that he drinks alcohol. He reports that he does not use illicit drugs.   Family History:  The patient's family history includes Cancer in his other; Heart failure in his father; Hypertension in his mother; Lung cancer in his other; Osteoarthritis in his mother.    ROS:   Please see the history of present illness.   Review of Systems  Constitution: Positive for weight loss.  Gastrointestinal: Positive for hematochezia.  All other systems reviewed and are negative.     PHYSICAL EXAM: VS:  BP 112/74 mmHg  Pulse 62  Ht 6\' 1"  (1.854 m)  Wt 236 lb (107.049 kg)  BMI 31.14 kg/m2  SpO2 98%    Wt Readings from Last 3 Encounters:  08/15/15 236 lb (107.049 kg)  08/11/15 238 lb (107.956 kg)  04/29/15 251 lb 2 oz (113.91 kg)     GEN: Well nourished, well developed, in no acute distress HEENT: normal Neck: no JVD,  no masses Cardiac:  Normal S1/S2, RRR; no murmur ,  no rubs or gallops, no edema   Respiratory:  clear to auscultation bilaterally, no wheezing, rhonchi or rales. GI: soft, nontender, nondistended, + BS MS: no deformity or atrophy Skin: warm and dry  Neuro:  CNs II-XII intact, Strength and sensation are intact Psych: Normal affect   EKG:  EKG is ordered today.  It  demonstrates:   Sinus brady, HR 51, normal axis, NSSTTW changes, no change from prior tracing, QTc 418 ms    Recent Labs: 04/29/2015: ALT 28 08/11/2015: B Natriuretic Peptide 347.7*; BUN 16; Creatinine, Ser 1.42*; Hemoglobin 14.1; Platelets 173; Potassium 3.9; Sodium 135    Lipid Panel    Component Value Date/Time   CHOL 234* 05/17/2010 0905   TRIG 102.0 05/17/2010 0905   HDL 50.00 05/17/2010 0905   CHOLHDL 5 05/17/2010 0905   VLDL 20.4 05/17/2010 0905   LDLDIRECT 166.4 05/17/2010 0905      ASSESSMENT AND PLAN:  1. Cough:  His CXR is suggestive  of community acquired pneumonia. Given his ongoing cough for 2 weeks, I think it is best to treat him.  Will give him Amox 1000 mg TID x 7 days and Doxy 100 mg BID x 7 days.  Refer to PCP.  2. Chronic Systolic CHF:  He does not look volume overloaded on exam.  However, he has symptoms c/w volume excess and a recent BNP was minimally elevated.  Will keep him on low dose Lasix as his symptoms did improve with diuresis. Will start Lasix 20 mg QD.  BMET, BNP today. FU BMET 1 week.  3. NICM:  EF by Echo in 2014 was 25%. He notes adherence with beta-blocker and ACE inhibitor.  Will arrange FU echo to reassess LVF.  4. Rectal Bleeding:  He has a hx of hemorrhoids. Last episode of bleeding was 2 weeks ago.  Recent CBC with normal Hgb. Refer to PCP.  5. Weight Loss:  He notes unintended weight loss over the past several mos.  Records indicate loss of 12 lbs since 1/16.  Recent CXR, BMET, CBC ok.  Refer to PCP.  6. HTN:  Controlled.      Medication Changes: Current medicines are reviewed at length with the patient today.  Concerns regarding medicines are as outlined above.  The following changes have been made:   Discontinued Medications   FUROSEMIDE (LASIX) 40 MG TABLET    Take 1 tablet (40 mg total) by mouth daily.   Modified Medications   No medications on file   New Prescriptions   AMOXICILLIN (AMOXIL) 500 MG CAPSULE    Take 2 capsules  (1,000 mg total) by mouth 3 (three) times daily.   DOXYCYCLINE (VIBRAMYCIN) 100 MG CAPSULE    Take 1 capsule (100 mg total) by mouth 2 (two) times daily.   FUROSEMIDE (LASIX) 20 MG TABLET    Take 1 tablet (20 mg total) by mouth daily.   Labs/ tests ordered today include:   Orders Placed This Encounter  Procedures  . Basic Metabolic Panel (BMET)  . B Nat Peptide  . Basic Metabolic Panel (BMET)  . Echocardiogram      Disposition:    FU with Bruce Ransom, PA-C in 2 weeks as planned.  I gave the patient a note to remain out of work for 1 week.     Signed, Bruce Robertson, MHS 08/15/2015 4:51 PM    [facility] [address], [place], [place]  [address] Phone: [phone]; Fax: [phone]

## 2015-08-15 NOTE — Addendum Note (Signed)
Addended by: Michae Kava on: 08/15/2015 05:10 PM   Modules accepted: Orders

## 2015-08-16 ENCOUNTER — Telehealth: Payer: Self-pay | Admitting: *Deleted

## 2015-08-16 DIAGNOSIS — I5022 Chronic systolic (congestive) heart failure: Secondary | ICD-10-CM

## 2015-08-16 LAB — BRAIN NATRIURETIC PEPTIDE: Brain Natriuretic Peptide: 102.5 pg/mL — ABNORMAL HIGH (ref 0.0–100.0)

## 2015-08-16 MED ORDER — FUROSEMIDE 20 MG PO TABS: 20.0000 mg | ORAL_TABLET | Freq: Every day | ORAL | Status: DC

## 2015-08-16 NOTE — Telephone Encounter (Signed)
Pt seen by Richardson Dopp yesterday for evaluation.

## 2015-08-16 NOTE — Telephone Encounter (Signed)
Pt notified of lab results by phone. Pt advised to take lasix 20 mg for 4 days then; then take as needed if wt is up 3 lb's x 1 day, increased SOB or increased edema. Pt verbalized understanding to plan of care.

## 2015-08-17 NOTE — Addendum Note (Signed)
Addended by: Freada Bergeron on: 08/17/2015 01:32 PM   Modules accepted: Orders

## 2015-08-22 ENCOUNTER — Other Ambulatory Visit: Payer: Self-pay

## 2015-08-25 ENCOUNTER — Other Ambulatory Visit (INDEPENDENT_AMBULATORY_CARE_PROVIDER_SITE_OTHER): Payer: Self-pay | Admitting: *Deleted

## 2015-08-25 ENCOUNTER — Other Ambulatory Visit: Payer: Self-pay

## 2015-08-25 ENCOUNTER — Ambulatory Visit (HOSPITAL_COMMUNITY): Payer: Medicaid Other | Attending: Cardiovascular Disease

## 2015-08-25 DIAGNOSIS — E785 Hyperlipidemia, unspecified: Secondary | ICD-10-CM | POA: Insufficient documentation

## 2015-08-25 DIAGNOSIS — I428 Other cardiomyopathies: Secondary | ICD-10-CM

## 2015-08-25 DIAGNOSIS — I5022 Chronic systolic (congestive) heart failure: Secondary | ICD-10-CM

## 2015-08-25 DIAGNOSIS — I517 Cardiomegaly: Secondary | ICD-10-CM | POA: Insufficient documentation

## 2015-08-25 DIAGNOSIS — E669 Obesity, unspecified: Secondary | ICD-10-CM

## 2015-08-25 DIAGNOSIS — R079 Chest pain, unspecified: Secondary | ICD-10-CM

## 2015-08-25 DIAGNOSIS — I351 Nonrheumatic aortic (valve) insufficiency: Secondary | ICD-10-CM | POA: Diagnosis not present

## 2015-08-25 DIAGNOSIS — Z87891 Personal history of nicotine dependence: Secondary | ICD-10-CM | POA: Insufficient documentation

## 2015-08-25 DIAGNOSIS — R0602 Shortness of breath: Secondary | ICD-10-CM

## 2015-08-25 DIAGNOSIS — I429 Cardiomyopathy, unspecified: Secondary | ICD-10-CM

## 2015-08-25 DIAGNOSIS — I10 Essential (primary) hypertension: Secondary | ICD-10-CM | POA: Diagnosis not present

## 2015-08-25 LAB — BASIC METABOLIC PANEL
BUN: 16 mg/dL (ref 7–25)
CO2: 27 mmol/L (ref 20–31)
Calcium: 9.2 mg/dL (ref 8.6–10.3)
Chloride: 104 mmol/L (ref 98–110)
Creat: 1.37 mg/dL — ABNORMAL HIGH (ref 0.70–1.33)
Glucose, Bld: 82 mg/dL (ref 65–99)
Potassium: 4.1 mmol/L (ref 3.5–5.3)
Sodium: 138 mmol/L (ref 135–146)

## 2015-08-26 LAB — BRAIN NATRIURETIC PEPTIDE: Brain Natriuretic Peptide: 315 pg/mL — ABNORMAL HIGH (ref 0.0–100.0)

## 2015-08-27 ENCOUNTER — Encounter: Payer: Self-pay | Admitting: Physician Assistant

## 2015-08-28 ENCOUNTER — Telehealth: Payer: Self-pay | Admitting: *Deleted

## 2015-08-28 NOTE — Telephone Encounter (Signed)
I tried to reach pt but VM not set up on phone. I s/w pt's mother and asked for to please ask pt tcb so that we may go over results. No results released to pt's mother, did not see DPR.

## 2015-08-29 ENCOUNTER — Ambulatory Visit (INDEPENDENT_AMBULATORY_CARE_PROVIDER_SITE_OTHER): Payer: Self-pay | Admitting: Cardiology

## 2015-08-29 ENCOUNTER — Encounter: Payer: Self-pay | Admitting: Cardiology

## 2015-08-29 VITALS — BP 114/82 | HR 50 | Ht 73.0 in | Wt 242.4 lb

## 2015-08-29 DIAGNOSIS — I5023 Acute on chronic systolic (congestive) heart failure: Secondary | ICD-10-CM

## 2015-08-29 DIAGNOSIS — I428 Other cardiomyopathies: Secondary | ICD-10-CM

## 2015-08-29 DIAGNOSIS — R0602 Shortness of breath: Secondary | ICD-10-CM

## 2015-08-29 DIAGNOSIS — G4730 Sleep apnea, unspecified: Secondary | ICD-10-CM

## 2015-08-29 DIAGNOSIS — I429 Cardiomyopathy, unspecified: Secondary | ICD-10-CM

## 2015-08-29 DIAGNOSIS — N183 Chronic kidney disease, stage 3 unspecified: Secondary | ICD-10-CM

## 2015-08-29 DIAGNOSIS — Z951 Presence of aortocoronary bypass graft: Secondary | ICD-10-CM

## 2015-08-29 MED ORDER — LOSARTAN POTASSIUM-HCTZ 50-12.5 MG PO TABS: 1.0000 | ORAL_TABLET | Freq: Every day | ORAL | Status: DC

## 2015-08-29 NOTE — Assessment & Plan Note (Signed)
EF 25% 08/25/15

## 2015-08-29 NOTE — Assessment & Plan Note (Signed)
CABG '06 for LM dissection at cath. Re look cath '08 OK

## 2015-08-29 NOTE — Assessment & Plan Note (Signed)
Seen in ED 107/16. Seen by [facility] 08/15/15. He is still SOB at times. Still has a dry cough.

## 2015-08-29 NOTE — Assessment & Plan Note (Signed)
Not on C-pap, "not severe and C-pap not recommended per pt

## 2015-08-29 NOTE — Progress Notes (Signed)
08/29/2015 Bruce Robertson   [birth year]  [id number]  Primary Physician No PCP Per Patient Primary Cardiologist: Dr Bruce Robertson  HPI:  54 y/o AA male with a history of CABG in '02 secondary to LM dissection at cath. Re look cath in '08 showed patent grafts. His EF has been depressed in the past. 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           He was seen at [facility] ED 08/11/15 for dyspnea and was diagnosed with CHF. BNP was 347 but he had LE edema. He was given 5 days of Lasix and saw Bruce Robertson on 08/15/15. Bruce Robertson felt the pt's CHF was improved but he still had a cough and antibiotics were prescribed. The pt is here today for follow up.          He admits to me he eats "sea salt" and "seasong salt". He is always eating poorly when on the road, fast food mainly. He also told me he had some GI issues a few weeks ago and ate alot of chicken soup. He is not taking his lasix daily. He continues to have a cough though he thinks its a little better. He still gets SOB with activity. This is all new over the past 4-6 weeks. Echo done 08/25/15 shows an EF of 25%.                  Current Outpatient Prescriptions  Medication Sig Dispense Refill  . amoxicillin (AMOXIL) 500 MG capsule Take 1,500 mg by mouth 3 (three) times daily.    . carvedilol (COREG) 12.5 MG tablet TAKE ONE TABLET BY MOUTH TWICE DAILY WITH MEALS 60 tablet 2  . doxycycline (VIBRAMYCIN) 100 MG capsule Take 1 capsule (100 mg total) by mouth 2 (two) times daily. 14 capsule 0  . losartan-hydrochlorothiazide (HYZAAR) 50-12.5 MG tablet Take 1 tablet by mouth daily. 30 tablet 6   No current facility-administered medications for this visit.    No Known Allergies  Social History   Social History  . Marital Status: Single    Spouse Name: N/A  . Number of Children: N/A  . Years of Education: N/A   Occupational History  . Maintenence worker    Social  History Main Topics  . Smoking status: Current Some Day Smoker    Last Attempt to Quit: 11/04/2004  . Smokeless tobacco: Not on file  . Alcohol Use: Yes  . Drug Use: No  . Sexual Activity: Not on file   Other Topics Concern  . Not on file   Social History Narrative   Single     Review of Systems: General: negative for chills, fever, night sweats or weight changes.  Cardiovascular: negative for chest pain, palpitations, paroxysmal nocturnal dyspnea Dermatological: negative for rash Respiratory: negative for cough or wheezing Urologic: negative for hematuria Abdominal: negative for nausea, vomiting, diarrhea, bright red blood per rectum, melena, or hematemesis Neurologic: negative for visual changes, syncope, or dizziness All other systems reviewed and are otherwise negative except as noted above.    Blood pressure 114/82, pulse 50, height 6\' 1"  (1.854 m), weight 242 lb 7 oz (109.969 kg).  General appearance: alert, cooperative, no distress and mildly obese Neck: no carotid bruit and no JVD Lungs: clear to auscultation bilaterally Heart: regular rate and rhythm Extremities: no edema Skin: Skin color, texture, turgor normal. No rashes or lesions Neurologic: Grossly  normal  EKG NSR, PVCs, narrow complex QRS  ASSESSMENT AND PLAN:   Acute on chronic systolic (congestive) heart failure (HCC) Seen in ED 107/16. Seen by [facility] 08/15/15. He is still SOB at times. Still has a dry cough.  Non-ischemic cardiomyopathy ([place]) EF 25% 08/25/15  Hx of CABG CABG '06 for LM dissection at cath. Re look cath '08 OK  Sleep apnea Not on C-pap, "not severe and C-pap not recommended per pt    PLAN  I changed Bruce Robertson ACE to Hyzaar 50/12.5 for cough and CHF. We had a long discussion about avoiding all types of salt. He says he does not feel well currently and I think its best he continue out of work till we see him back in few weeks. We'll need to consider ICD therapy and I initiated  this subject with him today. He'll also need a BMP as he has chronic renal insufficiency.  Bruce Robertson K PA-C 08/29/2015 12:15 PM

## 2015-08-29 NOTE — Patient Instructions (Addendum)
Stop Lisinopril  Stop Lasix   Start Hyzaar 50/12.5 mg daily   Lab work Paramedic ) have done a few days before appointment with [place] physician recommends that you schedule a follow-up appointment with Dr.Hochrein Tue 09/19/15 at 1:45 pm

## 2015-08-30 ENCOUNTER — Telehealth: Payer: Self-pay | Admitting: *Deleted

## 2015-08-30 NOTE — Telephone Encounter (Signed)
I lvm w/pt's mother for ptcb on 10/24. Pt saw Kerin Ransom, [place] 10/25 and was notified of lab results as well as consideration of ICD. Pt has appt 11/15 with Dr. [name].

## 2015-09-19 ENCOUNTER — Encounter: Payer: Self-pay | Admitting: Cardiology

## 2015-09-19 ENCOUNTER — Ambulatory Visit (INDEPENDENT_AMBULATORY_CARE_PROVIDER_SITE_OTHER): Payer: Self-pay | Admitting: Cardiology

## 2015-09-19 VITALS — BP 98/60 | HR 54 | Ht 73.0 in | Wt 244.3 lb

## 2015-09-19 DIAGNOSIS — G4730 Sleep apnea, unspecified: Secondary | ICD-10-CM

## 2015-09-19 MED ORDER — LOSARTAN POTASSIUM 50 MG PO TABS: 50.0000 mg | ORAL_TABLET | Freq: Every day | ORAL | Status: DC

## 2015-09-19 NOTE — Progress Notes (Signed)
HPI The patient presents for followup of his cardiomyopathy. Years ago he had an ejection fraction of 40%. 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 He did end up requiring bypass because during his diagnostic catheterization he had dissection of his left main vessel. His ejection fraction eventually improved to about 45% with medications. The etiology was thought probably to be hypertensive.  Subsequent echocardiogram demonstrates his EF now to be about 25%.  He was last seen in the emergency room in October with volume overload and was given Lasix. He's been seen in my office in follow-up.  At the last office visit he did have his ACE inhibitor stopped an ARB started because of some mild chronic cough.   Since he was last seen he does feel better. His cough seems to have resolved. He is fatigued. He does get short of breath with moderate activity such as climbing to the top of the stairs. However, he's not describing PND or orthopnea. He's not had any palpitations, presyncope or syncope.  No Known Allergies  Current Outpatient Prescriptions  Medication Sig Dispense Refill  . carvedilol (COREG) 12.5 MG tablet TAKE ONE TABLET BY MOUTH TWICE DAILY WITH MEALS 60 tablet 2  . doxycycline (VIBRAMYCIN) 100 MG capsule Take 1 capsule (100 mg total) by mouth 2 (two) times daily. 14 capsule 0  . losartan-hydrochlorothiazide (HYZAAR) 50-12.5 MG tablet Take 1 tablet by mouth daily. 30 tablet 6   No current facility-administered medications for this visit.    Past Medical History  Diagnosis Date  . Cardiomyopathy     Nonischemic. EF has been about 45%. S/P CABG.;  b.  Echo 4/14: EF 25%, global HK with inf and mid apical AK, restrictive physiology with E/e' > 15 (elevated LV filling pressure), trivial AI/MR, mild to mod LAE, mild RVE, mild reduced RVSF, mild RAE, mod TR, PASP 74 (severe pulmonary HTN)   . Dyslipidemia   . Tobacco abuse     Remote  . Reflux   . Ulcer     In GI tract  . Colon polyps 2002  .  Hyperplastic rectal polyp   . Sleep apnea     Mild  . Coronary artery disease   . CHF (congestive heart failure) ([place])   . History of echocardiogram     Echo 10/16:  EF 25-30%, poss non-compaction, diff HK with inf-lat and apical HK, restrictive physio, mild AI, severe LAE, mild RVE with mild reduced RVSF, PASP 42 mmHg    Past Surgical History  Procedure Laterality Date  . Coronary artery bypass graft  2007    Had a left main dissection after catheterization. Underwent a saphenous vein graft to the LAD and a saphenous vein graft to obtuse marginal.  . Leg tendon surgery  2005    Right patellar  . Cardiac catheterization  2007 and 2009     x 2    ROS:  As stated in the HPI and negative for all other systems.  PHYSICAL EXAM BP 98/60 mmHg  Pulse 54  Ht 6\' 1"  (1.854 m)  Wt 244 lb 5 oz (110.819 kg)  BMI 32.24 kg/m2 GENERAL:  Well appearing NECK:  No jugular venous distention, waveform within normal limits, carotid upstroke brisk and symmetric, no bruits, no thyromegaly LUNGS:  Clear to auscultation bilaterally CHEST: Well healed sternotomy scar. HEART:  PMI not displaced or sustained,S1 and S2 within normal limits, no S3, no S4, no clicks, no rubs, no murmurs ABD:  Flat, positive bowel sounds normal in  frequency in pitch, no bruits, no rebound, no guarding, no midline pulsatile mass, no hepatomegaly, no splenomegaly EXT:  2 plus pulses throughout, no edema, no cyanosis no clubbing   ASSESSMENT AND PLAN:  CARDIOMYOPATHY:  I'm going to continue to titrate his meds. I'm going to stop his Hyzaar and change to Cozaar 50 mg daily. In the future I like to see if we can go up on his carvedilol or possibly add spiral lactone.  Once we have finished titrate meds if he remains symptomatic with an ejection fraction less than 35% we can consider an ICD.   HTN:  This will be managed in the context of treating his cardiomyopathy.   FATIGUE:  I will repeat a sleep study. I think he has some  worsening sleep apnea which was mild before.

## 2015-09-19 NOTE — Patient Instructions (Signed)
Your physician recommends that you schedule a follow-up appointment in: 2 Weeks with Bruce Robertson  Your physician has recommended that you have a sleep study. This test records several body functions during sleep, including: brain activity, eye movement, oxygen and carbon dioxide blood levels, heart rate and rhythm, breathing rate and rhythm, the flow of air through your mouth and nose, snoring, body muscle movements, and chest and belly movement.  Your physician has recommended you make the following change in your medication: STOP Losartan/HCTZ and START Losartan 50 mg daily

## 2015-09-22 ENCOUNTER — Institutional Professional Consult (permissible substitution): Payer: Self-pay | Admitting: Internal Medicine

## 2015-10-10 ENCOUNTER — Ambulatory Visit: Payer: Self-pay | Admitting: Cardiology

## 2015-10-10 DIAGNOSIS — R0989 Other specified symptoms and signs involving the circulatory and respiratory systems: Secondary | ICD-10-CM

## 2015-10-18 ENCOUNTER — Encounter: Payer: Self-pay | Admitting: Cardiology

## 2015-10-31 ENCOUNTER — Encounter: Payer: Self-pay | Admitting: Cardiology

## 2015-10-31 ENCOUNTER — Ambulatory Visit (INDEPENDENT_AMBULATORY_CARE_PROVIDER_SITE_OTHER): Payer: Self-pay | Admitting: Cardiology

## 2015-10-31 VITALS — BP 116/76 | HR 60 | Ht 73.0 in | Wt 244.4 lb

## 2015-10-31 DIAGNOSIS — Z951 Presence of aortocoronary bypass graft: Secondary | ICD-10-CM

## 2015-10-31 DIAGNOSIS — I429 Cardiomyopathy, unspecified: Secondary | ICD-10-CM

## 2015-10-31 DIAGNOSIS — I428 Other cardiomyopathies: Secondary | ICD-10-CM

## 2015-10-31 DIAGNOSIS — I10 Essential (primary) hypertension: Secondary | ICD-10-CM

## 2015-10-31 NOTE — Assessment & Plan Note (Signed)
EF 25% Oct 2016

## 2015-10-31 NOTE — Progress Notes (Signed)
    10/31/2015 Bruce Robertson   [birth year]  [id number]  Primary Physician No PCP Per Patient Primary Cardiologist: Dr [name]  HPI:  The patient is a 54 y/o AA male with a history of NICM. He had CABG in 2006 after he had LM dissection. Years ago he had an ejection fraction of 40%. 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 Subsequent echocardiogram demonstrates his EF  to be about 25% in Oct 2016. He was seen in the emergency room with volume overload and was given Lasix. He was in the office in follow-up. At the last office visit he did have his ACE inhibitor stopped an ARB started because of some mild chronic cough. He is here today for follow up. He says he is still SOB though he says he has walked two laps [place] without stopping. He says he wakes up at night SOB and has to sit on the side of the bed. His B/P is under better control and his diet is better now that he is not on the road driving.   Current Outpatient Prescriptions  Medication Sig Dispense Refill  . carvedilol (COREG) 12.5 MG tablet TAKE ONE TABLET BY MOUTH TWICE DAILY WITH MEALS 60 tablet 2  . doxycycline (VIBRAMYCIN) 100 MG capsule Take 1 capsule (100 mg total) by mouth 2 (two) times daily. 14 capsule 0  . losartan (COZAAR) 50 MG tablet Take 1 tablet (50 mg total) by mouth daily. 90 tablet 3   No current facility-administered medications for this visit.    No Known Allergies  Social History   Social History  . Marital Status: Single    Spouse Name: N/A  . Number of Children: N/A  . Years of Education: N/A   Occupational History  . Maintenence worker    Social History Main Topics  . Smoking status: Current Some Day Smoker    Last Attempt to Quit: 11/04/2004  . Smokeless tobacco: Not on file  . Alcohol Use: Yes  . Drug Use: No  . Sexual Activity: Not on file   Other Topics Concern  . Not on file   Social History Narrative   Single     Review of Systems: General: negative for chills,  fever, night sweats or weight changes.  Cardiovascular: negative for chest pain, dyspnea on exertion, edema, orthopnea, palpitations, paroxysmal nocturnal dyspnea or shortness of breath Dermatological: negative for rash Respiratory: negative for cough or wheezing Urologic: negative for hematuria Abdominal: negative for nausea, vomiting, diarrhea, bright red blood per rectum, melena, or hematemesis Neurologic: negative for visual changes, syncope, or dizziness All other systems reviewed and are otherwise negative except as noted above.    Blood pressure 116/76, pulse 60, height 6\' 1"  (1.854 m), weight 244 lb 6.4 oz (110.859 kg).  General appearance: alert, cooperative and no distress Neck: no adenopathy, no carotid bruit, no JVD, supple, symmetrical, trachea midline and thyroid not enlarged, symmetric, no tenderness/mass/nodules Lungs: clear to auscultation bilaterally Heart: regular rate and rhythm Extremities: extremities normal, atraumatic, no cyanosis or edema Neurologic: Grossly normal   ASSESSMENT AND PLAN:   Non-ischemic cardiomyopathy ([place]) EF 25% Oct 2016  Hx of CABG CABG '06 for LM dissection at cath. Re look cath '08 OK  Essential hypertension Controlled    PLAN  Check echo in Feb 2017- f/u with Dr [name] after that. Return to driving when OK with Dr [name].  Erlene Quan PA-C 10/31/2015 3:46 PM

## 2015-10-31 NOTE — Patient Instructions (Signed)
Medication Instructions:  Please continue your current medications  Labwork: NONE  Testing/Procedures: 1. 2D Echocardiogram - Your physician has requested that you have an echocardiogram in 2 months. Echocardiography is a painless test that uses sound waves to create images of your heart. It provides your doctor with information about the size and shape of your heart and how well your heart's chambers and valves are working. This procedure takes approximately one hour. There are no restrictions for this procedure.  Follow-Up: Kerin Ransom, PA-C, recommends that you schedule a follow-up appointment in after your echocardiogram.  If you need a refill on your cardiac medications before your next appointment, please call your pharmacy.

## 2015-10-31 NOTE — Assessment & Plan Note (Signed)
CABG '06 for LM dissection at cath. Re look cath '08 OK 

## 2015-10-31 NOTE — Assessment & Plan Note (Signed)
Controlled.  

## 2015-11-30 ENCOUNTER — Encounter (HOSPITAL_BASED_OUTPATIENT_CLINIC_OR_DEPARTMENT_OTHER): Payer: Self-pay

## 2015-12-26 ENCOUNTER — Encounter (HOSPITAL_COMMUNITY): Payer: Self-pay

## 2015-12-26 NOTE — Progress Notes (Signed)
[place] [facility] requesting medical records 08/05/15-present. No records available from our office or providers/no encounter for this time period. Notation made and request returned to provided 3 [phone] Copy of request scanned into electronic medical records.  Renee Pain

## 2015-12-29 ENCOUNTER — Other Ambulatory Visit: Payer: Self-pay | Admitting: Cardiology

## 2015-12-29 NOTE — Telephone Encounter (Signed)
REFILL 

## 2016-01-02 ENCOUNTER — Other Ambulatory Visit (HOSPITAL_COMMUNITY): Payer: Self-pay

## 2016-01-10 ENCOUNTER — Encounter (HOSPITAL_COMMUNITY): Payer: Self-pay

## 2016-01-10 NOTE — Progress Notes (Signed)
Second request faxed from [place] for medical records on patient for 08/2015-present. Again, return fax sent stating there are no records or encounters from our office/providers for this time frame. Copy of request with notation stating this faxed to provided # [phone] and scanned into patient's electronic medical records. Case # [id number]  Renee Pain

## 2016-01-12 ENCOUNTER — Other Ambulatory Visit: Payer: Self-pay

## 2016-01-12 ENCOUNTER — Ambulatory Visit (HOSPITAL_COMMUNITY): Payer: Medicaid Other | Attending: Cardiology

## 2016-01-12 ENCOUNTER — Telehealth: Payer: Self-pay | Admitting: *Deleted

## 2016-01-12 DIAGNOSIS — I071 Rheumatic tricuspid insufficiency: Secondary | ICD-10-CM | POA: Diagnosis not present

## 2016-01-12 DIAGNOSIS — I429 Cardiomyopathy, unspecified: Secondary | ICD-10-CM | POA: Diagnosis present

## 2016-01-12 DIAGNOSIS — Z720 Tobacco use: Secondary | ICD-10-CM | POA: Insufficient documentation

## 2016-01-12 DIAGNOSIS — I77810 Thoracic aortic ectasia: Secondary | ICD-10-CM | POA: Diagnosis not present

## 2016-01-12 DIAGNOSIS — I517 Cardiomegaly: Secondary | ICD-10-CM | POA: Diagnosis not present

## 2016-01-12 DIAGNOSIS — I340 Nonrheumatic mitral (valve) insufficiency: Secondary | ICD-10-CM | POA: Insufficient documentation

## 2016-01-12 DIAGNOSIS — I428 Other cardiomyopathies: Secondary | ICD-10-CM

## 2016-01-12 DIAGNOSIS — I272 Other secondary pulmonary hypertension: Secondary | ICD-10-CM | POA: Diagnosis not present

## 2016-01-12 DIAGNOSIS — Z951 Presence of aortocoronary bypass graft: Secondary | ICD-10-CM | POA: Diagnosis not present

## 2016-01-12 DIAGNOSIS — I351 Nonrheumatic aortic (valve) insufficiency: Secondary | ICD-10-CM | POA: Diagnosis not present

## 2016-01-12 DIAGNOSIS — I5189 Other ill-defined heart diseases: Secondary | ICD-10-CM | POA: Diagnosis not present

## 2016-01-12 DIAGNOSIS — I5023 Acute on chronic systolic (congestive) heart failure: Secondary | ICD-10-CM

## 2016-01-12 MED ORDER — FUROSEMIDE 40 MG PO TABS: 40.0000 mg | ORAL_TABLET | Freq: Every day | ORAL | Status: DC

## 2016-01-12 NOTE — Telephone Encounter (Signed)
-----   Message from Erlene Quan, [place] sent at 01/12/2016  5:02 PM EST ----- Please let pt know his echo shows his heart is still weak. He is also fluid overloaded. Add Lasix 40 mg daily and have BMP next week (Wed) . F/U Dr [name] in 2 weeks   Lurena Joiner [facility] PA-C 01/12/2016 5:02 PM

## 2016-01-12 NOTE — Telephone Encounter (Signed)
Spoke with pt, aware of echo results.  Patient voiced understanding of medication orders. He is aware of the need for lab work Wednesday next week.

## 2016-01-17 LAB — BASIC METABOLIC PANEL WITH GFR
BUN: 22 mg/dL (ref 7–25)
CO2: 27 mmol/L (ref 20–31)
Calcium: 9.9 mg/dL (ref 8.6–10.3)
Chloride: 102 mmol/L (ref 98–110)
Creat: 1.72 mg/dL — ABNORMAL HIGH (ref 0.70–1.33)
Glucose, Bld: 95 mg/dL (ref 65–99)
Potassium: 5.2 mmol/L (ref 3.5–5.3)
Sodium: 141 mmol/L (ref 135–146)

## 2016-01-19 ENCOUNTER — Telehealth: Payer: Self-pay | Admitting: Physician Assistant

## 2016-01-19 NOTE — Telephone Encounter (Signed)
I spoke to Bruce Robertson.  His weight decreased from 242 to 236.  I asked him to hold the lasix until he sees Dr. [name] on Monday and to drink and extra 524ml of water today.  Monitor your weight every morning.  If he gains 3 pounds in 24 hours, or 5 pounds in a week, take a dose of lasix.  Tarri Fuller PAC

## 2016-01-21 NOTE — Progress Notes (Signed)
HPI The patient presents for followup of his cardiomyopathy. Years ago he had an ejection fraction of 40%. 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 He did end up requiring bypass because during his diagnostic catheterization he had dissection of his left main vessel. His ejection fraction eventually improved to about 45% with medications. The etiology was thought probably to be hypertensive.  Subsequent echocardiogram demonstrates his EF now to be about 25%.  He has had a difficult time with medications and compliance. 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 He has been taking his medications he says more routinely. I did want him to get a sleep study to evaluate what was previously mild sleep apnea.  He says he sleeps poorly. He is under a lot of stress with his job and living situation. He doesn't have insurance. All of this complicates the management of his cardiomyopathy and I think has led to his slowly falling ejection fraction. He does have dyspnea with exertion seems to happen more after he is done walking and while he is walking. He will be short of breath very fatigued. He's not describing PND or orthopnea. He's not having any chest discomfort. He's had no palpitations, presyncope or syncope.  No Known Allergies  Current Outpatient Prescriptions  Medication Sig Dispense Refill  . carvedilol (COREG) 12.5 MG tablet TAKE ONE TABLET BY MOUTH TWICE DAILY WITH MEALS 60 tablet 1  . doxycycline (VIBRAMYCIN) 100 MG capsule Take 1 capsule (100 mg total) by mouth 2 (two) times daily. 14 capsule 0  . furosemide (LASIX) 40 MG tablet Take 1 tablet (40 mg total) by mouth daily. 30 tablet 6  . losartan (COZAAR) 50 MG tablet Take 1 tablet (50 mg total) by mouth daily. 90 tablet 3   No current facility-administered medications for this visit.    Past Medical History  Diagnosis Date  . Cardiomyopathy     Nonischemic. EF has been about 45%. S/P CABG.;  b.  Echo 4/14: EF 25%, global HK with inf and mid apical AK,  restrictive physiology with E/e' > 15 (elevated LV filling pressure), trivial AI/MR, mild to mod LAE, mild RVE, mild reduced RVSF, mild RAE, mod TR, PASP 74 (severe pulmonary HTN)   . Dyslipidemia   . Tobacco abuse     Remote  . Reflux   . Ulcer     In GI tract  . Colon polyps 2002  . Hyperplastic rectal polyp   . Sleep apnea     Mild  . Coronary artery disease   . CHF (congestive heart failure) ([place])   . History of echocardiogram     Echo 10/16:  EF 25-30%, poss non-compaction, diff HK with inf-lat and apical HK, restrictive physio, mild AI, severe LAE, mild RVE with mild reduced RVSF, PASP 42 mmHg    Past Surgical History  Procedure Laterality Date  . Coronary artery bypass graft  2007    Had a left main dissection after catheterization. Underwent a saphenous vein graft to the LAD and a saphenous vein graft to obtuse marginal.  . Leg tendon surgery  2005    Right patellar  . Cardiac catheterization  2007 and 2009     x 2    ROS:  As stated in the HPI and negative for all other systems.  PHYSICAL EXAM BP 114/76 mmHg  Pulse 60  Ht 6\' 1"  (1.854 m)  Wt 245 lb 1.6 oz (111.177 kg)  BMI 32.34 kg/m2 GENERAL:  Well appearing NECK:  No  jugular venous distention, waveform within normal limits, carotid upstroke brisk and symmetric, no bruits, no thyromegaly LUNGS:  Clear to auscultation bilaterally CHEST: Well healed sternotomy scar. HEART:  PMI not displaced or sustained,S1 and S2 within normal limits, no S3, no S4, no clicks, no rubs, no murmurs ABD:  Flat, positive bowel sounds normal in frequency in pitch, no bruits, no rebound, no guarding, no midline pulsatile mass, no hepatomegaly, no splenomegaly EXT:  2 plus pulses throughout, no edema, no cyanosis no clubbing   ASSESSMENT AND PLAN:  CARDIOMYOPATHY:  Today I'm going to titrate his Cozaar by adding another 25 mg in the evening. We can continue to titrate medications. However, I think he needs a change in lifestyle  shouldn't be driving long distances.  We will see him back to continue med titration. Only after would maximize therapy to possibly include Entresto if we can get him medication coverage or assistance from the company would I follow-up on an echo and consider an ICD.Marland Kitchen   HTN:  This will be managed in the context of treating his cardiomyopathy.  FATIGUE:  He finally agrees to having a repeat sleep study.

## 2016-01-22 ENCOUNTER — Encounter: Payer: Self-pay | Admitting: Cardiology

## 2016-01-22 ENCOUNTER — Ambulatory Visit (INDEPENDENT_AMBULATORY_CARE_PROVIDER_SITE_OTHER): Payer: Self-pay | Admitting: Cardiology

## 2016-01-22 VITALS — BP 114/76 | HR 60 | Ht 73.0 in | Wt 245.1 lb

## 2016-01-22 DIAGNOSIS — G4730 Sleep apnea, unspecified: Secondary | ICD-10-CM

## 2016-01-22 MED ORDER — LOSARTAN POTASSIUM 50 MG PO TABS: 50.0000 mg | ORAL_TABLET | Freq: Every morning | ORAL | Status: DC

## 2016-01-22 NOTE — Patient Instructions (Addendum)
Your physician recommends that you schedule a follow-up appointment in: 1 Month with [place] physician has recommended you make the following change in your medication: Take 50 mg Losartan in the Morning and 25 mg in the evening  Your physician has recommended that you have a sleep study. This test records several body functions during sleep, including: brain activity, eye movement, oxygen and carbon dioxide blood levels, heart rate and rhythm, breathing rate and rhythm, the flow of air through your mouth and nose, snoring, body muscle movements, and chest and belly movement.

## 2016-02-01 DIAGNOSIS — Z736 Limitation of activities due to disability: Secondary | ICD-10-CM

## 2016-02-19 ENCOUNTER — Ambulatory Visit (INDEPENDENT_AMBULATORY_CARE_PROVIDER_SITE_OTHER): Payer: Self-pay | Admitting: Cardiology

## 2016-02-19 ENCOUNTER — Encounter: Payer: Self-pay | Admitting: Cardiology

## 2016-02-19 VITALS — BP 110/82 | HR 56 | Ht 73.0 in | Wt 250.0 lb

## 2016-02-19 DIAGNOSIS — Z951 Presence of aortocoronary bypass graft: Secondary | ICD-10-CM

## 2016-02-19 DIAGNOSIS — N183 Chronic kidney disease, stage 3 unspecified: Secondary | ICD-10-CM

## 2016-02-19 DIAGNOSIS — I429 Cardiomyopathy, unspecified: Secondary | ICD-10-CM

## 2016-02-19 DIAGNOSIS — I428 Other cardiomyopathies: Secondary | ICD-10-CM

## 2016-02-19 DIAGNOSIS — I10 Essential (primary) hypertension: Secondary | ICD-10-CM

## 2016-02-19 DIAGNOSIS — I5023 Acute on chronic systolic (congestive) heart failure: Secondary | ICD-10-CM

## 2016-02-19 LAB — BASIC METABOLIC PANEL
BUN: 20 mg/dL (ref 7–25)
CO2: 26 mmol/L (ref 20–31)
Calcium: 9.7 mg/dL (ref 8.6–10.3)
Chloride: 107 mmol/L (ref 98–110)
Creat: 1.53 mg/dL — ABNORMAL HIGH (ref 0.70–1.33)
Glucose, Bld: 100 mg/dL — ABNORMAL HIGH (ref 65–99)
Potassium: 5.3 mmol/L (ref 3.5–5.3)
Sodium: 142 mmol/L (ref 135–146)

## 2016-02-19 MED ORDER — ISOSORBIDE MONONITRATE ER 30 MG PO TB24: 30.0000 mg | ORAL_TABLET | Freq: Every day | ORAL | Status: DC

## 2016-02-19 NOTE — Assessment & Plan Note (Signed)
Pt seen in the office for follow up. He still has DOE

## 2016-02-19 NOTE — Patient Instructions (Signed)
Medication Instructions:  Your physician has recommended you make the following change in your medication:  IMDUR 30 MG BY MOUTH DAILY.   Labwork: Your physician recommends that you have lab work TODAY. The lab can be found on the FIRST FLOOR of out building in [place]    Testing/Procedures: none  Follow-Up: Your physician recommends that you schedule a follow-up appointment in: 2-3 [place] (ON A DAY THE DR [place]).   Any Other Special Instructions Will Be Listed Below (If Applicable).     If you need a refill on your cardiac medications before your next appointment, please call your pharmacy.

## 2016-02-19 NOTE — Assessment & Plan Note (Signed)
CABG '06 for LM dissection at cath. Re look cath '08 OK He has had some exertional chest pressure, Myoview low risk in 2014

## 2016-02-19 NOTE — Progress Notes (Signed)
02/19/2016 Bruce Robertson   02/02/1961  [id number]  Primary Physician No PCP Per Patient Primary Cardiologist: Dr [name]  HPI:  55 y/o AA male with a history of cardiomyopathy. In 2006 he had an ejection fraction of 40%. 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 He did end up requiring bypass because during his diagnostic catheterization he had dissection of his left main vessel. He received an SVG to the LAD and an SVG-OM. His ejection fraction eventually improved to about 45% with medications. Cardiac catheterization in 2008 demonstrated normal arteries and 2/2 patent bypass grafts. The etiology was thought probably to be hypertensive. A Myoview in 2013 was reportedly low risk but I couldn't find this report. 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  Subsequent echocardiogram in Aug 2016 demonstrates his EF has deteriorated to 25%. We have been adjusting his medications. He tells me he is now on disability and has applied for Medicaid.  Dr [name] saw him in the office 01/21/16. He is here today to have his medication titrated.             He still has some DOE. This weekend he says he was cutting the grass when he had to stop secondary to SOB and chest pressure "likle someone standing on my chest".  He denies any chest pain at rest.       Current Outpatient Prescriptions  Medication Sig Dispense Refill  . carvedilol (COREG) 12.5 MG tablet TAKE ONE TABLET BY MOUTH TWICE DAILY WITH MEALS 60 tablet 1  . doxycycline (VIBRAMYCIN) 100 MG capsule Take 1 capsule (100 mg total) by mouth 2 (two) times daily. 14 capsule 0  . furosemide (LASIX) 40 MG tablet Take 1 tablet (40 mg total) by mouth daily. 30 tablet 6  . isosorbide mononitrate (IMDUR) 30 MG 24 hr tablet Take 1 tablet (30 mg total) by mouth daily. 90 tablet 3  . losartan (COZAAR) 50 MG tablet Take 1 tablet (50 mg total) by mouth every  morning. 25 mg in the evening 135 tablet 3   No current facility-administered medications for this visit.    No Known Allergies  Social History   Social History  . Marital Status: Single    Spouse Name: N/A  . Number of Children: N/A  . Years of Education: N/A   Occupational History  . Maintenence worker    Social History Main Topics  . Smoking status: Former Smoker    Quit date: 11/04/2004  . Smokeless tobacco: Not on file  . Alcohol Use: 0.0 oz/week    0 Standard drinks or equivalent per week  . Drug Use: No  . Sexual Activity: Not on file   Other Topics Concern  . Not on file   Social History Narrative   Single     Review of Systems: General: negative for chills, fever, night sweats or weight changes.  Cardiovascular: negative for chest pain, dyspnea on exertion, edema, orthopnea, palpitations, paroxysmal nocturnal dyspnea or shortness of breath Dermatological: negative for rash Respiratory: negative for cough or wheezing Urologic: negative for hematuria Abdominal: negative for nausea, vomiting, diarrhea, bright red blood per rectum, melena, or hematemesis Neurologic: negative for visual changes, syncope, or dizziness All other systems reviewed and are otherwise negative except as noted above.    Blood pressure 110/82, pulse 56, height 6\' 1"  (1.854 m), weight 250 lb (113.399 kg).  General appearance: alert,  cooperative, no distress and mildly obese Neck: no carotid bruit and no JVD Lungs: clear to auscultation bilaterally Heart: regular rate and rhythm Extremities: no edema Neurologic: Grossly normal  EKG NSR, SB- HR 56. TWI 2,3 V4-V6- more pronounced than on previous tracings.   ASSESSMENT AND PLAN:   Acute on chronic systolic (congestive) heart failure (HCC) Pt seen in the office for follow up. He still has DOE  Hx of CABG CABG '06 for LM dissection at cath. Re look cath '08 OK He has had some exertional chest pressure, Myoview low risk in  2014  Non-ischemic cardiomyopathy ([place]) EF 25% Oct 2016  Essential hypertension Controlled  Chronic renal disease, stage III Last SCr 1.7, K+ 5.2   PLAN  Reviewed with Dr Debara Pickett in the office today. I am concerned he may have an anginal component to his dyspnea, not all CHF. Unfortunately his last SCr was 1.7 and he is still uninsured and "buried in debt". I added Imdur 30 mg. Unable to titrate his medications- B/P [id number] systolic, K+ 5.2, SCr 1.7. Will check a BNP and BMP today. F/U with Dr [name].   Kerin Ransom K PA-C 02/19/2016 1:18 PM

## 2016-02-19 NOTE — Assessment & Plan Note (Signed)
EF 25% Oct 2016

## 2016-02-19 NOTE — Assessment & Plan Note (Signed)
Controlled.  

## 2016-02-19 NOTE — Assessment & Plan Note (Signed)
Last SCr 1.7, K+ 5.2

## 2016-02-20 LAB — BRAIN NATRIURETIC PEPTIDE: Brain Natriuretic Peptide: 262.4 pg/mL — ABNORMAL HIGH (ref <100)

## 2016-03-05 ENCOUNTER — Ambulatory Visit (INDEPENDENT_AMBULATORY_CARE_PROVIDER_SITE_OTHER): Payer: Self-pay | Admitting: Cardiology

## 2016-03-05 ENCOUNTER — Encounter: Payer: Self-pay | Admitting: Cardiology

## 2016-03-05 VITALS — BP 104/64 | HR 50 | Ht 73.0 in | Wt 243.0 lb

## 2016-03-05 DIAGNOSIS — N183 Chronic kidney disease, stage 3 unspecified: Secondary | ICD-10-CM

## 2016-03-05 DIAGNOSIS — G4730 Sleep apnea, unspecified: Secondary | ICD-10-CM

## 2016-03-05 DIAGNOSIS — I429 Cardiomyopathy, unspecified: Secondary | ICD-10-CM

## 2016-03-05 DIAGNOSIS — I10 Essential (primary) hypertension: Secondary | ICD-10-CM

## 2016-03-05 DIAGNOSIS — R0602 Shortness of breath: Secondary | ICD-10-CM | POA: Insufficient documentation

## 2016-03-05 DIAGNOSIS — I428 Other cardiomyopathies: Secondary | ICD-10-CM

## 2016-03-05 DIAGNOSIS — R0600 Dyspnea, unspecified: Secondary | ICD-10-CM

## 2016-03-05 DIAGNOSIS — R0609 Other forms of dyspnea: Secondary | ICD-10-CM

## 2016-03-05 DIAGNOSIS — Z951 Presence of aortocoronary bypass graft: Secondary | ICD-10-CM

## 2016-03-05 MED ORDER — ISOSORBIDE MONONITRATE ER 30 MG PO TB24: 15.0000 mg | ORAL_TABLET | Freq: Every day | ORAL | Status: DC

## 2016-03-05 MED ORDER — LOSARTAN POTASSIUM 50 MG PO TABS: 50.0000 mg | ORAL_TABLET | Freq: Every morning | ORAL | Status: DC

## 2016-03-05 NOTE — Assessment & Plan Note (Signed)
Previously not bad enough for C-pap but repeat study is upcoming

## 2016-03-05 NOTE — Assessment & Plan Note (Signed)
This is his main complaint today.

## 2016-03-05 NOTE — Progress Notes (Signed)
03/05/2016 [name]   1960/11/28  [id number]  Primary Physician No PCP Per Patient Primary Cardiologist: Dr [name]  HPI:    55 y/o AA male with a history of cardiomyopathy. In 2006 he had an ejection fraction of 40%. 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 He did end up requiring bypass because during his diagnostic catheterization he had dissection of his left main. He received an SVG to the LAD and an SVG-OM. His ejection fraction eventually improved to about 45% with medications. Cardiac catheterization in 2008 demonstrated normal arteries and 2/2 patent bypass grafts. The etiology of his cardiomyopathy was thought to be hypertensive. A Myoview in 2013 was reportedly low risk but I couldn't find this report. 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 Echocardiogram in Aug 2016 showed his EF had deteriorated to 25%, and a f/u echo 01/12/16 showed similar result. We have been adjusting his medications. 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  Dr [name] saw him in the office 01/21/16 and I saw him 02/19/16. He is here today to have his medication titrated.              Since he was last here he say he feels better overall. He is not working, getting appropriate rest (he was "living on [employer] and energy drinks" when he was driving long distance). He does complain of a headache which is most likely secondary to Imdur. His last K+ was 5.3- similar to the one before that. His B/P is borderline low and he admits to some orthostatic symptoms. He also complains of SOB but its not consistent. Sometimes he gets SOB walking across the room, sometimes he says he feels like he could run a mile. He denies orthopnea.     Current Outpatient Prescriptions  Medication Sig Dispense Refill  . carvedilol  (COREG) 12.5 MG tablet TAKE ONE TABLET BY MOUTH TWICE DAILY WITH MEALS 60 tablet 1  . doxycycline (VIBRAMYCIN) 100 MG capsule Take 1 capsule (100 mg total) by mouth 2 (two) times daily. 14 capsule 0  . furosemide (LASIX) 40 MG tablet Take 1 tablet (40 mg total) by mouth daily. 30 tablet 6  . isosorbide mononitrate (IMDUR) 30 MG 24 hr tablet Take 0.5 tablets (15 mg total) by mouth daily. 30 tablet 3  . losartan (COZAAR) 50 MG tablet Take 1 tablet (50 mg total) by mouth every morning. 135 tablet 3   No current facility-administered medications for this visit.    No Known Allergies  Social History   Social History  . Marital Status: Single    Spouse Name: N/A  . Number of Children: N/A  . Years of Education: N/A   Occupational History  . Maintenence worker    Social History Main Topics  . Smoking status: Former Smoker    Quit date: 11/04/2004  . Smokeless tobacco: Not on file  . Alcohol Use: 0.0 oz/week    0 Standard drinks or equivalent per week  . Drug Use: No  . Sexual Activity: Not on file   Other Topics Concern  . Not on file   Social History Narrative   Single     Review of Systems: General: negative for chills, fever, night sweats or weight changes.  Cardiovascular: negative for chest pain,  dyspnea on exertion, edema, orthopnea, palpitations, paroxysmal nocturnal dyspnea Dermatological: negative for rash Respiratory: negative for cough or wheezing Urologic: negative for hematuria Abdominal: negative for nausea, vomiting, diarrhea, bright red blood per rectum, melena, or hematemesis Neurologic: negative for visual changes, syncope, or dizziness All other systems reviewed and are otherwise negative except as noted above.    Blood pressure 104/64, pulse 50, height 6\' 1"  (1.854 m), weight 243 lb (110.224 kg).  General appearance: alert, cooperative, no distress and mildly obese Neck: no carotid bruit and no JVD Lungs: clear to auscultation bilaterally Heart:  regular rate and rhythm Extremities: no edema Neurologic: Grossly normal   ASSESSMENT AND PLAN:   Dyspnea on exertion This is his main complaint today.  Non-ischemic cardiomyopathy ([place]) EF 25% Oct 2016, and 01/12/16  Chronic renal disease, stage III Cozaar recently increased but Pt's K+ is high and B/P low  Essential hypertension B/P controlled  Hx of CABG CABG '06 for LM dissection at cath. Re look cath '08 OK  Sleep apnea Previously not bad enough for C-pap but repeat study is upcoming   PLAN  The plan has been to maximize medical Rx but his B/P is borderline and his K+ persistently high.  I cut back his Imdur secondary to H/A and decreased his Cozaar to 50 mg daily. He has been instructed to avoid high K+ foods. We'll check another echo in June and he can f/u with Dr [name] then- ? ICD.    Kerin Ransom K PA-C 03/05/2016 1:02 PM

## 2016-03-05 NOTE — Assessment & Plan Note (Signed)
B/P controlled 

## 2016-03-05 NOTE — Assessment & Plan Note (Signed)
CABG '06 for LM dissection at cath. Re look cath '08 OK 

## 2016-03-05 NOTE — Assessment & Plan Note (Signed)
EF 25% Oct 2016, and 01/12/16

## 2016-03-05 NOTE — Assessment & Plan Note (Signed)
Cozaar recently increased but Pt's K+ is high and B/P low

## 2016-03-05 NOTE — Patient Instructions (Addendum)
Medication Instructions:   START TAKING IMDUR 15 MG ONCE A DAY   START TAKING LOSARTAN 50 MG ONCE A DAY  If you need a refill on your cardiac medications before your next appointment, please call your pharmacy.  Labwork:   Testing/Procedures: NEEDS TO BE AFTER  JUNE 10 OR LATER .Your physician has requested that you have an echocardiogram. Echocardiography is a painless test that uses sound waves to create images of your heart. It provides your doctor with information about the size and shape of your heart and how well your heart's chambers and valves are working. This procedure takes approximately one hour. There are no restrictions for this procedure.     Follow-Up: 6 WEEKS WITH DR HOCHREIN POST ECHO FOLLOW UP    Any Other Special Instructions Will Be Listed Below (If Applicable).

## 2016-03-10 ENCOUNTER — Ambulatory Visit (HOSPITAL_BASED_OUTPATIENT_CLINIC_OR_DEPARTMENT_OTHER): Payer: Medicaid Other | Attending: Cardiology | Admitting: Cardiovascular Disease

## 2016-03-10 DIAGNOSIS — R0683 Snoring: Secondary | ICD-10-CM | POA: Diagnosis not present

## 2016-03-10 DIAGNOSIS — G4719 Other hypersomnia: Secondary | ICD-10-CM | POA: Diagnosis not present

## 2016-03-10 DIAGNOSIS — G4733 Obstructive sleep apnea (adult) (pediatric): Secondary | ICD-10-CM | POA: Insufficient documentation

## 2016-03-10 DIAGNOSIS — G4730 Sleep apnea, unspecified: Secondary | ICD-10-CM

## 2016-03-18 ENCOUNTER — Encounter (HOSPITAL_BASED_OUTPATIENT_CLINIC_OR_DEPARTMENT_OTHER): Payer: Self-pay | Admitting: Cardiovascular Disease

## 2016-03-18 NOTE — Procedures (Signed)
Patient Name: Bruce Robertson, Bruce Robertson Date: 03/10/2016 Gender: Male D.O.B: August 15, 1961 Age (years): 47 Referring Provider: Minus Breeding Height (inches): 71 Interpreting Physician: Shelva Majestic MD, ABSM Weight (lbs): 244 RPSGT: Gerhard Perches BMI: 34 MRN: [id number] Neck Size: 18.00  CLINICAL INFORMATION Sleep Study Type: Split Night CPAP Indication for sleep study: OSA Epworth Sleepiness Score: 14; c/w excessive daytime sleepiness  SLEEP STUDY TECHNIQUE As per the AASM Manual for the Scoring of Sleep and Associated Events v2.3 (April 2016) with a hypopnea requiring 4% desaturations. The channels recorded and monitored were frontal, central and occipital EEG, electrooculogram (EOG), submentalis EMG (chin), nasal and oral airflow, thoracic and abdominal wall motion, anterior tibialis EMG, snore microphone, electrocardiogram, and pulse oximetry. Continuous positive airway pressure (CPAP) was initiated when the patient met split night criteria and was titrated according to treat sleep-disordered breathing.  MEDICATIONS  carvedilol (COREG) 12.5 MG tablet      doxycycline (VIBRAMYCIN) 100 MG capsule 100 mg, 2 times daily     furosemide (LASIX) 40 MG tablet 40 mg, Daily     isosorbide mononitrate (IMDUR) 30 MG 24 hr tablet 15 mg, Daily     losartan (COZAAR) 50   Medications administered by patient during sleep study : No sleep medicine administered.  RESPIRATORY PARAMETERS Diagnostic Total AHI (/hr): 26.7 RDI (/hr): 33.5 OA Index (/hr): 1.2 CA Index (/hr): 5.3 REM AHI (/hr): 6.5 NREM AHI (/hr): 38.5 Supine AHI (/hr): 43.3 Non-supine AHI (/hr): 13.18 Min O2 Sat (%): 88.00 Mean O2 (%): 94.70 Time below 88% (min): 0.3   Titration Optimal Pressure (cm): 9 AHI at Optimal Pressure (/hr): 0.0 Min O2 at Optimal Pressure (%): 94.0 Supine % at Optimal (%): 32 Sleep % at Optimal (%): 95    SLEEP ARCHITECTURE The recording time for the entire night was 367.3 minutes. During a  baseline period of 203.4 minutes, the patient slept for 148.5 minutes in REM and nonREM, yielding a sleep efficiency of 73.0%. Sleep onset after lights out was 38.5 minutes with a REM latency of 88.5 minutes. The patient spent 15.16% of the night in stage N1 sleep, 47.79% in stage N2 sleep, 0.00% in stage N3 and 37.05% in REM. During the titration period of 155.2 minutes, the patient slept for 145.5 minutes in REM and nonREM, yielding a sleep efficiency of 93.8%. Sleep onset after CPAP initiation was 3.9 minutes with a REM latency of 66.5 minutes. The patient spent 4.47% of the night in stage N1 sleep, 84.88% in stage N2 sleep, 0.00% in stage N3 and 10.65% in REM.  CARDIAC DATA The 2 lead EKG demonstrated sinus rhythm. The mean heart rate was 51.39 beats per minute. Other EKG findings include: None.  LEG MOVEMENT DATA The total Periodic Limb Movements of Sleep (PLMS) were 0. The PLMS index was 0.00 .  IMPRESSIONS - Moderate obstructive sleep apnea occurred during the diagnostic portion of the study(AHI = 26.7/hour); events were severe with supine position (AHI 43.3/hr). An optimal PAP pressure was selected for this patient ( 9 cm of water) - No significant central sleep apnea occurred during the diagnostic portion of the study (CAI = 5.3/hour). - Minimal oxygen desaturation to a nadir of 88.00%. - The patient snored with Soft snoring volume during the diagnostic portion of the study. Snoring was eliminated with CPAP.  - No cardiac abnormalities were noted during this study. - Clinically significant periodic limb movements did not occur during sleep.  DIAGNOSIS - Obstructive Sleep Apnea (327.23 [G47.33 ICD-10])  RECOMMENDATIONS -  Recommend an initial trial of CPAP therapy with EPR at 9 cm H2O pressure with  heated humidification. A Medium size [employer]kel Full Face Mask Simplus mask was used for the titration. - Due to the significant positional component, the patient should be counseled in  avoiding supine sleep.  - Avoid alcohol, sedatives and other CNS depressants that may worsen sleep apnea and disrupt normal sleep architecture. - Sleep hygiene should be reviewed to assess factors that may improve sleep quality. - Weight management and regular exercise should be initiated or continued. - Recommned a download in 30 days and sleep clinic evaluation.   Troy Sine, MD, [place], American Board of Sleep Medicine  ELECTRONICALLY SIGNED ON:  03/18/2016, 11:06 PM [place] PH: [phone]   FX: [phone] [place]

## 2016-03-28 ENCOUNTER — Telehealth: Payer: Self-pay | Admitting: *Deleted

## 2016-03-28 NOTE — Telephone Encounter (Signed)
Spoke with patient informing him of sleep study results and recommendations.

## 2016-03-29 ENCOUNTER — Other Ambulatory Visit: Payer: Self-pay | Admitting: Cardiology

## 2016-04-16 ENCOUNTER — Ambulatory Visit (HOSPITAL_COMMUNITY): Payer: Medicaid Other | Attending: Internal Medicine

## 2016-04-16 ENCOUNTER — Other Ambulatory Visit: Payer: Self-pay

## 2016-04-16 DIAGNOSIS — I351 Nonrheumatic aortic (valve) insufficiency: Secondary | ICD-10-CM | POA: Diagnosis not present

## 2016-04-16 DIAGNOSIS — I358 Other nonrheumatic aortic valve disorders: Secondary | ICD-10-CM | POA: Diagnosis not present

## 2016-04-16 DIAGNOSIS — I340 Nonrheumatic mitral (valve) insufficiency: Secondary | ICD-10-CM | POA: Insufficient documentation

## 2016-04-16 DIAGNOSIS — I119 Hypertensive heart disease without heart failure: Secondary | ICD-10-CM | POA: Diagnosis not present

## 2016-04-16 DIAGNOSIS — I429 Cardiomyopathy, unspecified: Secondary | ICD-10-CM | POA: Insufficient documentation

## 2016-04-16 DIAGNOSIS — Z951 Presence of aortocoronary bypass graft: Secondary | ICD-10-CM | POA: Diagnosis not present

## 2016-04-16 DIAGNOSIS — I071 Rheumatic tricuspid insufficiency: Secondary | ICD-10-CM | POA: Insufficient documentation

## 2016-04-16 DIAGNOSIS — I428 Other cardiomyopathies: Secondary | ICD-10-CM

## 2016-04-16 LAB — ECHOCARDIOGRAM COMPLETE
Ao-asc: 36 cm
E decel time: 275 msec
E/e' ratio: 13.92
FS: 13 % — AB (ref 28–44)
IVS/LV PW RATIO, ED: 1.19
LA ID, A-P, ES: 45 mm
LA diam end sys: 45 mm
LA diam index: 1.86 cm/m2
LA vol A4C: 106 ml
LA vol index: 43.4 mL/m2
LA vol: 105 mL
LV E/e' medial: 13.92
LV E/e'average: 13.92
LV PW d: 9.53 mm — AB (ref 0.6–1.1)
LV dias vol index: 84 mL/m2
LV dias vol: 204 mL — AB (ref 62–150)
LV e' LATERAL: 8.19 cm/s
LV sys vol index: 62 mL/m2
LV sys vol: 150 mL — AB (ref 21–61)
LVOT SV: 55 mL
LVOT VTI: 14.6 cm
LVOT area: 3.8 cm2
LVOT diameter: 22 mm
LVOT peak grad rest: 2 mmHg
LVOT peak vel: 63.9 cm/s
MV Dec: 275
MV Peak grad: 5 mmHg
MV pk A vel: 49.4 m/s
MV pk E vel: 114 m/s
P 1/2 time: 820 ms
Reg peak vel: 297 cm/s
S' Lateral: 11.4 cm/s
Simpson's disk: 26
Stroke v: 54 ml
TDI e' lateral: 8.19
TDI e' medial: 5.26
TR max vel: 297 cm/s

## 2016-04-21 NOTE — Progress Notes (Signed)
HPI The patient presents for followup of his cardiomyopathy. Years ago he had an ejection fraction of 40%. 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 He did end up requiring bypass because during his diagnostic catheterization he had dissection of his left main vessel. His ejection fraction eventually improved to about 45% with medications. The etiology was thought probably to be hypertensive.  Subsequent echocardiogram demonstrates his EF now to be about 25%.  He has had a difficult time with medications and compliance.   Since I saw him in March he saw Kerin Ransom.  He had some low BPs so his Cozaar was reduced.  With headaches he had his Imdur reduced.  However, he didn't actually change the doses.  He has also had a sleep study.   He was found to have moderate sleep apnea.  He is going to get his CPAP.  He continues to have tiredness as his biggest complaint.  He has some episodic dyspnea.  However, this is not classic for decompensated CHF, PND or orthopnea.     No Known Allergies  Current Outpatient Prescriptions  Medication Sig Dispense Refill  . carvedilol (COREG) 12.5 MG tablet TAKE ONE TABLET BY MOUTH TWICE DAILY WITH MEALS 60 tablet 3  . doxycycline (VIBRAMYCIN) 100 MG capsule Take 1 capsule (100 mg total) by mouth 2 (two) times daily. 14 capsule 0  . furosemide (LASIX) 40 MG tablet Take 1 tablet (40 mg total) by mouth daily. 30 tablet 6  . isosorbide mononitrate (IMDUR) 30 MG 24 hr tablet Take 30 mg by mouth daily.    Marland Kitchen losartan (COZAAR) 50 MG tablet Take 1 tablet (50 mg total) by mouth every morning. 135 tablet 3   No current facility-administered medications for this visit.    Past Medical History  Diagnosis Date  . Cardiomyopathy     Nonischemic. EF has been about 45%. S/P CABG.;  b.  Echo 4/14: EF 25%, global HK with inf and mid apical AK, restrictive physiology with E/e' > 15 (elevated LV filling pressure), trivial AI/MR, mild to mod LAE, mild RVE, mild reduced RVSF, mild RAE, mod  TR, PASP 74 (severe pulmonary HTN)   . Dyslipidemia   . Tobacco abuse     Remote  . Reflux   . Ulcer     In GI tract  . Colon polyps 2002  . Hyperplastic rectal polyp   . Sleep apnea     Mild  . Coronary artery disease   . CHF (congestive heart failure) ([place])   . History of echocardiogram     Echo 10/16:  EF 25-30%, poss non-compaction, diff HK with inf-lat and apical HK, restrictive physio, mild AI, severe LAE, mild RVE with mild reduced RVSF, PASP 42 mmHg    Past Surgical History  Procedure Laterality Date  . Coronary artery bypass graft  2007    Had a left main dissection after catheterization. Underwent a saphenous vein graft to the LAD and a saphenous vein graft to obtuse marginal.  . Leg tendon surgery  2005    Right patellar  . Cardiac catheterization  2007 and 2009     x 2    ROS:  As stated in the HPI and negative for all other systems.  PHYSICAL EXAM BP 112/70 mmHg  Pulse 47  Ht 6\' 1"  (1.854 m)  Wt 246 lb (111.585 kg)  BMI 32.46 kg/m2 GENERAL:  Well appearing NECK:  No jugular venous distention, waveform within normal limits, carotid upstroke brisk and symmetric,  no bruits, no thyromegaly LUNGS:  Clear to auscultation bilaterally CHEST: Well healed sternotomy scar. HEART:  PMI not displaced or sustained,S1 and S2 within normal limits, no S3, no S4, no clicks, no rubs, no murmurs ABD:  Flat, positive bowel sounds normal in frequency in pitch, no bruits, no rebound, no guarding, no midline pulsatile mass, no hepatomegaly, no splenomegaly EXT:  2 plus pulses throughout, no edema, no cyanosis no clubbing   ASSESSMENT AND PLAN:  CARDIOMYOPATHY:  I am going to continue the current therapy as listed.  His BP will not allow med titration.  His symptoms might be related to sleep apnea.  We need to get this treated and then see if he has recurrent symptoms and whether we can still titrate meds.  Once we have maximized meds and sleep apnea treatment I will repeat an  echocardiogram.   SLEEP APNEA:    He is waiting on a CPAP.    HTN:  This will be managed in the context of treating his cardiomyopathy.

## 2016-04-22 ENCOUNTER — Encounter: Payer: Self-pay | Admitting: Cardiology

## 2016-04-22 ENCOUNTER — Ambulatory Visit (INDEPENDENT_AMBULATORY_CARE_PROVIDER_SITE_OTHER): Payer: Medicaid Other | Admitting: Cardiology

## 2016-04-22 VITALS — BP 112/70 | HR 47 | Ht 73.0 in | Wt 246.0 lb

## 2016-04-22 DIAGNOSIS — I428 Other cardiomyopathies: Secondary | ICD-10-CM

## 2016-04-22 DIAGNOSIS — I429 Cardiomyopathy, unspecified: Secondary | ICD-10-CM

## 2016-04-22 NOTE — Patient Instructions (Signed)
Medication Instructions:  Continue current medications  Labwork: NONE  Testing/Procedures: NONE  Follow-Up: Your physician wants you to follow-up in: 3 Months. You will receive a reminder letter in the mail two months in advance. If you don't receive a letter, please call our office to schedule the follow-up appointment.   Any Other Special Instructions Will Be Listed Below (If Applicable).  If you need a refill on your cardiac medications before your next appointment, please call your pharmacy.

## 2016-04-29 DIAGNOSIS — K635 Polyp of colon: Secondary | ICD-10-CM | POA: Insufficient documentation

## 2016-06-28 ENCOUNTER — Other Ambulatory Visit: Payer: Self-pay

## 2016-06-28 MED ORDER — FUROSEMIDE 20 MG PO TABS: 40.0000 mg | ORAL_TABLET | Freq: Every day | ORAL | 3 refills | Status: DC

## 2016-06-28 NOTE — Telephone Encounter (Signed)
Received fax from pharmacy, lasix 40mg  is on back order sending in 20mg  to pharmacy. Patient will take 2 tabs once a day.

## 2016-07-11 ENCOUNTER — Ambulatory Visit (INDEPENDENT_AMBULATORY_CARE_PROVIDER_SITE_OTHER): Payer: Medicaid Other | Admitting: Cardiovascular Disease

## 2016-07-11 ENCOUNTER — Encounter: Payer: Self-pay | Admitting: Cardiovascular Disease

## 2016-07-11 VITALS — BP 110/84 | HR 60 | Ht 73.0 in | Wt 228.2 lb

## 2016-07-11 DIAGNOSIS — R0600 Dyspnea, unspecified: Secondary | ICD-10-CM

## 2016-07-11 DIAGNOSIS — I429 Cardiomyopathy, unspecified: Secondary | ICD-10-CM | POA: Diagnosis not present

## 2016-07-11 DIAGNOSIS — I428 Other cardiomyopathies: Secondary | ICD-10-CM

## 2016-07-11 DIAGNOSIS — R0609 Other forms of dyspnea: Secondary | ICD-10-CM

## 2016-07-11 DIAGNOSIS — G4733 Obstructive sleep apnea (adult) (pediatric): Secondary | ICD-10-CM

## 2016-07-11 NOTE — Patient Instructions (Signed)
Your physician wants you to follow-up in: 1 year or sooner if needed in sleep clinic. You will receive a reminder letter in the mail two months in advance. If you don't receive a letter, please call our office to schedule the follow-up appointment.

## 2016-07-11 NOTE — Progress Notes (Signed)
Bruce Robertson   [birth year]  [id number]  Primary Physician No PCP Per Patient Primary Cardiologist: Dr [name]  HPI: Bruce Robertson is a 55 y.o. male who presents to sleep clinic after recent initiation of CPAP therapy for obstructive sleep apnea.  [name] has a history of a nonischemic cardiomyopathy.  He had issues with significant daytime sleepiness, fatigability, and snoring, and was referred for a sleep study which was done on 03/10/2016 and a split-night protocol.  This confirmed moderate obstructive sleep apnea overall with an HI of 26.7.  However, the events were severe supine position with an AHI of 43.3.  He reduce his oxygen to a nadir of 88%.  There was evidence for soft snoring.  He initially was titrated up to 9 cm water pressure.  His set up date was 05/06/2016.  A download from 06/03/2016 through 07/02/2016 shows that he is meeting compliance with reference to days of usage being 77%.  However, he is noncompliant with usage greater than 4 hours, such that he had only 43% of days reaching this threshold.  His average usage on days used was 4 hours and 24 minutes.  His AHI was 6.3.  Upon further questioning, he feels that he is still not getting enough air.  He does have a positional component to his sleep apnea and I suspect with supine sleep.  He will require greater pressures.  He still has residual daytime sleepiness as assessed by his Epworth sleepiness scale score of 11, which is outlined below.   Epworth Sleepiness Scale: Situation   Chance of Dozing/Sleeping (0 = never , 1 = slight chance , 2 = moderate chance , 3 = high chance )   sitting and reading 2   watching TV 2   sitting inactive in a public place 1   being a passenger in a motor vehicle for an hour or more 2   lying down in the afternoon 3   sitting and talking to someone 0   sitting quietly after lunch (no alcohol) 1   while stopped for a few minutes in traffic as the driver 0   Total Score  11      Past Medical History:  Diagnosis Date  . Cardiomyopathy    Nonischemic. EF has been about 45%. S/P CABG.;  b.  Echo 4/14: EF 25%, global HK with inf and mid apical AK, restrictive physiology with E/e' > 15 (elevated LV filling pressure), trivial AI/MR, mild to mod LAE, mild RVE, mild reduced RVSF, mild RAE, mod TR, PASP 74 (severe pulmonary HTN)   . CHF (congestive heart failure) ([place])   . Colon polyps 2002  . Coronary artery disease   . Dyslipidemia   . History of echocardiogram    Echo 10/16:  EF 25-30%, poss non-compaction, diff HK with inf-lat and apical HK, restrictive physio, mild AI, severe LAE, mild RVE with mild reduced RVSF, PASP 42 mmHg  . Hyperplastic rectal polyp   . Reflux   . Sleep apnea    Mild  . Tobacco abuse    Remote  . Ulcer    In GI tract    Past Surgical History:  Procedure Laterality Date  . CARDIAC CATHETERIZATION  2007 and 2009    x 2  . CORONARY ARTERY BYPASS GRAFT  2007   Had a left main dissection after catheterization. Underwent a saphenous vein graft to the LAD and a saphenous vein graft to obtuse marginal.  . LEG TENDON SURGERY  2005   Right patellar    No Known Allergies  Current Outpatient Prescriptions  Medication Sig Dispense Refill  . carvedilol (COREG) 12.5 MG tablet TAKE ONE TABLET BY MOUTH TWICE DAILY WITH MEALS 60 tablet 3  . doxycycline (VIBRAMYCIN) 100 MG capsule Take 1 capsule (100 mg total) by mouth 2 (two) times daily. 14 capsule 0  . furosemide (LASIX) 20 MG tablet Take 2 tablets (40 mg total) by mouth daily. 180 tablet 3  . isosorbide mononitrate (IMDUR) 30 MG 24 hr tablet Take 30 mg by mouth daily.     No current facility-administered medications for this visit.     Social History   Social History  . Marital status: Single    Spouse name: N/A  . Number of children: N/A  . Years of education: N/A   Occupational History  . 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 Smoking status: Former  Smoker    Quit date: 11/04/2004  . Smokeless tobacco: Not on file  . Alcohol use 0.0 oz/week  . Drug use: No  . Sexual activity: Not on file   Other Topics Concern  . Not on file   Social History Narrative   Single    Family History  Problem Relation Age of Onset  . Lung cancer Other   . Cancer Other   . Hypertension Mother   . Osteoarthritis Mother   . Heart failure Father      ROS General: Negative; No fevers, chills, or night sweats HEENT: Negative; No changes in vision or hearing, sinus congestion, difficulty swallowing Pulmonary: Negative; No cough, wheezing, shortness of breath, hemoptysis Cardiovascular: Positive for a nonischemic cardiomyopathy.  No chest pain. GI: Negative; No nausea, vomiting, diarrhea, or abdominal pain GU: Negative; No dysuria, hematuria, or difficulty voiding Musculoskeletal: Negative; no myalgias, joint pain, or weakness Hematologic: Negative; no easy bruising, bleeding Endocrine: Negative; no heat/cold intolerance Neuro: Negative; no changes in balance, headaches Skin: Negative; No rashes or skin lesions Psychiatric: Negative; No behavioral problems, depression Sleep: Positive for sleep apnea as noted above with daytime sleepiness, hypersomnolence; no bruxism, restless legs, hypnogognic hallucinations, no cataplexy   Physical Exam BP 110/84 (BP Location: Left Arm, Patient Position: Sitting, Cuff Size: Large)   Pulse 60   Ht '6\' 1"'$  (1.854 m)   Wt 228 lb 3.2 oz (103.5 kg)   BMI 30.11 kg/m    Repeat blood pressure by me was 98/60 both in the supine and standing positions.  Wt Readings from Last 3 Encounters:  07/11/16 228 lb 3.2 oz (103.5 kg)  04/22/16 246 lb (111.6 kg)  03/11/16 244 lb (110.7 kg)   General: Alert, oriented, no distress.  Skin: normal turgor, no rashes HEENT: Normocephalic, atraumatic. Pupils round and reactive; sclera anicteric; extraocular muscles intact; Fundi Is flat.  No hemorrhages or exudates. Nose without  nasal septal hypertrophy Mouth/Parynx benign; Mallinpatti scale 3 Neck: No JVD, no carotid bruits Lungs: clear to ausculatation and percussion; no wheezing or rales  Chest wall: No tenderness to palpation Heart: RRR, s1 s2 normal; no S3 gallop.  No rubs thrills or heaves  Abdomen: soft, nontender; no hepatosplenomehaly, BS+; abdominal aorta nontender and not dilated by palpation. Back: No CVA tenderness Pulses 2+ Extremities: no clubbinbg cyanosis or edema, Homan's sign negative  Neurologic: grossly nonfocal; cranial nerves intact. Psychological: Normal affect and mood.  ECG (independently read by me): Not done today, but his ECG from 02/19/2016 was reviewed and reveals sinus bradycardia at 56,  left posterior hemiblock, lateral T-wave changes, and small Q waves in lead 3 and aVF.  LABS:  BMP Latest Ref Rng & Units 02/19/2016 01/17/2016 08/25/2015  Glucose 65 - 99 mg/dL 100(H) 95 82  BUN 7 - 25 mg/dL _0 Creatinine 0.70 - 1.33 mg/dL 1.53(H) 1.72(H) 1.37(H)  Sodium 135 - 146 mmol/L 142 141 138  Potassium 3.5 - 5.3 mmol/L 5.3 5.2 4.1  Chloride 98 - 110 mmol/L 107 102 104  CO2 20 - 31 mmol/L _1 Calcium 8.6 - 10.3 mg/dL 9.7 9.9 9.2     Hepatic Function Latest Ref Rng & Units 04/29/2015 05/17/2010 02/08/2010  Total Protein 6.5 - 8.1 g/dL 6.5 7.0 7.0  Albumin 3.5 - 5.0 g/dL 3.8 4.1 4.1  AST 15 - 41 U/L 30 34 34  ALT 17 - 63 U/L 28 28 33  Alk Phosphatase 38 - 126 U/L 40 41 41  Total Bilirubin 0.3 - 1.2 mg/dL 0.3 0.8 0.7  Bilirubin, Direct 0.0 - 0.3 mg/dL - 0.1 0.1     CBC Latest Ref Rng & Units 08/11/2015 04/29/2015 06/02/2011  WBC 4.0 - 10.5 K/uL 4.9 4.9 6.3  Hemoglobin 13.0 - 17.0 g/dL 14.1 14.1 15.7  Hematocrit 39.0 - 52.0 % 43.4 42.5 45.4  Platelets 150 - 400 K/uL 173 146(L) 144(L)     Lipid Panel     Component Value Date/Time   CHOL 234 (H) 05/17/2010 0905   TRIG 102.0 05/17/2010 0905   HDL 50.00 05/17/2010 0905   CHOLHDL 5 05/17/2010 0905   VLDL 20.4  05/17/2010 0905   LDLDIRECT 166.4 05/17/2010 0905     RADIOLOGY: No results found.    ASSESSMENT AND PLAN: Mr. Quincey Quesinberry is a 55 year old African-American male who has a history of a nonischemic cardiomyopathy.  In the past his ejection fraction was in the 40-45% range, but this has further reduced to approximately 25%.  He has had issues with low blood pressure, leading to reduction of some of his medications.  He was referred for a sleep study, which I reviewed with him in detail today.  This confirms moderately severe sleep apnea with an overall AHI of 26.7.  However, there was a significant positional component with supine sleep AHI at 43.3 per hour, consistent with severe sleep apnea with supine posture.  His download reveals an AHI of 6.3 at 9 cm water pressure.  When he is on his back he does not feel that he is getting adequate air.  Due to this positional component  I have adjusted his CPAP unit and  changed him to an auto mode with a minimum pressure of 8 and a maximum pressure of 20.  He is using a fullface mask.  I discussed with him the importance of meeting compliance standards and although he is compliant with reference to use it days.  He is not compliant with usage greater than 4 hours.  I discussed with him normal sleep architecture and discussed with him that the preponderance of REM sleep occurs  in the second half of night and it is essential that he uses his CPAP and worse his mask for the nights duration.  I discussed adverse consequences associated with untreated sleep apnea particularly with reference to cardiovascular implications.  A new download will be obtained in 30 days, which hopefully will document compliance and show improvement in his AHI with the auto mode.  He is sleeping for adequate duration and typically goes to bed between  10 and 11 and wakes up at around 6 AM.  We will contact him following results of his additional download and further recommendations will  be made at that time.  Time spent: 25 minutes  Troy Sine, MD, [facility]  07/11/2016 10:25 AM

## 2016-07-24 NOTE — Progress Notes (Signed)
HPI The patient presents for followup of his cardiomyopathy. Years ago he had an ejection fraction of 40%. 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 He did end up requiring bypass because during his diagnostic catheterization he had dissection of his left main vessel. His ejection fraction eventually improved to about 45% with medications. The etiology was thought probably to be hypertensive.  Subsequent echocardiogram demonstrates his EF now to be about 25%.  He has had a difficult time with medications and compliance.  At the last appointment I suggested that her treatment of his sleep apnea which may help with his ejection fraction actually. He has since been followed by Dr. Claiborne Billings and had adjustments of his device. He returns for follow-up.  He has had problems with some lightheadedness and systolic blood pressures in the 90s at times. He has started eating salt to try to bring his blood pressure up. However, he's still taking his Lasix. He occasionally will get short of breath. He denies any PND or orthopnea. He's had no chest pressure, neck or arm discomfort. He's been slightly losing weight. Since he said his sleep apnea treated with better in the last 2 weeks.  No Known Allergies  Current Outpatient Prescriptions  Medication Sig Dispense Refill  . carvedilol (COREG) 12.5 MG tablet TAKE ONE TABLET BY MOUTH TWICE DAILY WITH MEALS 60 tablet 3  . doxycycline (VIBRAMYCIN) 100 MG capsule Take 1 capsule (100 mg total) by mouth 2 (two) times daily. 14 capsule 0  . furosemide (LASIX) 20 MG tablet Take 2 tablets (40 mg total) by mouth daily. 180 tablet 3  . isosorbide mononitrate (IMDUR) 30 MG 24 hr tablet Take 1 tablet (30 mg total) by mouth daily. 30 tablet 11   No current facility-administered medications for this visit.     Past Medical History:  Diagnosis Date  . Cardiomyopathy    Nonischemic. EF has been about 45%. S/P CABG.;  b.  Echo 4/14: EF 25%, global HK with inf and mid apical AK,  restrictive physiology with E/e' > 15 (elevated LV filling pressure), trivial AI/MR, mild to mod LAE, mild RVE, mild reduced RVSF, mild RAE, mod TR, PASP 74 (severe pulmonary HTN)   . CHF (congestive heart failure) ([place])   . Colon polyps 2002  . Coronary artery disease   . Dyslipidemia   . History of echocardiogram    Echo 10/16:  EF 25-30%, poss non-compaction, diff HK with inf-lat and apical HK, restrictive physio, mild AI, severe LAE, mild RVE with mild reduced RVSF, PASP 42 mmHg  . Hyperplastic rectal polyp   . Reflux   . Sleep apnea    Mild  . Tobacco abuse    Remote  . Ulcer    In GI tract    Past Surgical History:  Procedure Laterality Date  . CARDIAC CATHETERIZATION  2007 and 2009    x 2  . CORONARY ARTERY BYPASS GRAFT  2007   Had a left main dissection after catheterization. Underwent a saphenous vein graft to the LAD and a saphenous vein graft to obtuse marginal.  . LEG TENDON SURGERY  2005   Right patellar    ROS:   As stated in the HPI and negative for all other systems.  PHYSICAL EXAM BP 125/80   Pulse (!) 49   Ht 6\' 1"  (1.854 m)   Wt 231 lb (104.8 kg)   BMI 30.48 kg/m  GENERAL:  Well appearing NECK:  No jugular venous distention, waveform within normal limits, carotid  upstroke brisk and symmetric, no bruits, no thyromegaly LUNGS:  Clear to auscultation bilaterally CHEST: Well healed sternotomy scar. HEART:  PMI not displaced or sustained,S1 and S2 within normal limits, no S3, no S4, no clicks, no rubs, no murmurs ABD:  Flat, positive bowel sounds normal in frequency in pitch, no bruits, no rebound, no guarding, no midline pulsatile mass, no hepatomegaly, no splenomegaly EXT:  2 plus pulses throughout, no edema, no cyanosis no clubbing   ASSESSMENT AND PLAN:  CARDIOMYOPATHY:  His BP will not allow med titration.  I will reduce Lasix to PRN.   I will repeat an echo in Dec and he will see me back after that.    SLEEP APNEA:   This is much improved as above.    HYPOTENSION:  This is low as above.

## 2016-07-25 ENCOUNTER — Encounter: Payer: Self-pay | Admitting: Cardiology

## 2016-07-25 ENCOUNTER — Ambulatory Visit (INDEPENDENT_AMBULATORY_CARE_PROVIDER_SITE_OTHER): Payer: Medicaid Other | Admitting: Cardiology

## 2016-07-25 VITALS — BP 125/80 | HR 49 | Ht 73.0 in | Wt 231.0 lb

## 2016-07-25 DIAGNOSIS — I429 Cardiomyopathy, unspecified: Secondary | ICD-10-CM

## 2016-07-25 DIAGNOSIS — R0609 Other forms of dyspnea: Secondary | ICD-10-CM

## 2016-07-25 DIAGNOSIS — R0600 Dyspnea, unspecified: Secondary | ICD-10-CM

## 2016-07-25 DIAGNOSIS — I428 Other cardiomyopathies: Secondary | ICD-10-CM

## 2016-07-25 MED ORDER — ISOSORBIDE MONONITRATE ER 30 MG PO TB24: 30.0000 mg | ORAL_TABLET | Freq: Every day | ORAL | 11 refills | Status: DC

## 2016-07-25 NOTE — Patient Instructions (Signed)
Medication Instructions:  REDUCE- Furosemide as needed  Labwork: None Ordered  Testing/Procedures: Your physician has requested that you have an echocardiogram in December. Echocardiography is a painless test that uses sound waves to create images of your heart. It provides your doctor with information about the size and shape of your heart and how well your heart's chambers and valves are working. This procedure takes approximately one hour. There are no restrictions for this procedure.  Follow-Up: Your physician wants you to follow-up in: December after Echo. You will receive a reminder letter in the mail two months in advance. If you don't receive a letter, please call our office to schedule the follow-up appointment.   Any Other Special Instructions Will Be Listed Below (If Applicable).   If you need a refill on your cardiac medications before your next appointment, please call your pharmacy.

## 2016-07-31 ENCOUNTER — Observation Stay (HOSPITAL_COMMUNITY)
Admission: EM | Admit: 2016-07-31 | Discharge: 2016-08-01 | Disposition: A | Discharge status: Home / Self Care | Payer: Medicaid Other | Attending: Internal Medicine | Admitting: Internal Medicine

## 2016-07-31 ENCOUNTER — Encounter (HOSPITAL_COMMUNITY): Payer: Self-pay

## 2016-07-31 ENCOUNTER — Emergency Department (HOSPITAL_COMMUNITY): Payer: Medicaid Other

## 2016-07-31 DIAGNOSIS — I82409 Acute embolism and thrombosis of unspecified deep veins of unspecified lower extremity: Secondary | ICD-10-CM

## 2016-07-31 DIAGNOSIS — Z8719 Personal history of other diseases of the digestive system: Secondary | ICD-10-CM | POA: Insufficient documentation

## 2016-07-31 DIAGNOSIS — N183 Chronic kidney disease, stage 3 (moderate): Secondary | ICD-10-CM | POA: Insufficient documentation

## 2016-07-31 DIAGNOSIS — R197 Diarrhea, unspecified: Secondary | ICD-10-CM

## 2016-07-31 DIAGNOSIS — Z86010 Personal history of colonic polyps: Secondary | ICD-10-CM | POA: Diagnosis not present

## 2016-07-31 DIAGNOSIS — K219 Gastro-esophageal reflux disease without esophagitis: Secondary | ICD-10-CM | POA: Insufficient documentation

## 2016-07-31 DIAGNOSIS — I130 Hypertensive heart and chronic kidney disease with heart failure and stage 1 through stage 4 chronic kidney disease, or unspecified chronic kidney disease: Secondary | ICD-10-CM | POA: Diagnosis not present

## 2016-07-31 DIAGNOSIS — I509 Heart failure, unspecified: Secondary | ICD-10-CM | POA: Diagnosis not present

## 2016-07-31 DIAGNOSIS — Z87891 Personal history of nicotine dependence: Secondary | ICD-10-CM | POA: Diagnosis not present

## 2016-07-31 DIAGNOSIS — R509 Fever, unspecified: Secondary | ICD-10-CM | POA: Diagnosis not present

## 2016-07-31 DIAGNOSIS — G4733 Obstructive sleep apnea (adult) (pediatric): Secondary | ICD-10-CM | POA: Diagnosis not present

## 2016-07-31 DIAGNOSIS — I429 Cardiomyopathy, unspecified: Secondary | ICD-10-CM | POA: Insufficient documentation

## 2016-07-31 DIAGNOSIS — Z7982 Long term (current) use of aspirin: Secondary | ICD-10-CM | POA: Diagnosis not present

## 2016-07-31 DIAGNOSIS — Z951 Presence of aortocoronary bypass graft: Secondary | ICD-10-CM | POA: Diagnosis not present

## 2016-07-31 DIAGNOSIS — E785 Hyperlipidemia, unspecified: Secondary | ICD-10-CM | POA: Insufficient documentation

## 2016-07-31 DIAGNOSIS — R079 Chest pain, unspecified: Secondary | ICD-10-CM

## 2016-07-31 DIAGNOSIS — I272 Other secondary pulmonary hypertension: Secondary | ICD-10-CM | POA: Insufficient documentation

## 2016-07-31 DIAGNOSIS — I2510 Atherosclerotic heart disease of native coronary artery without angina pectoris: Secondary | ICD-10-CM | POA: Diagnosis not present

## 2016-07-31 DIAGNOSIS — I5023 Acute on chronic systolic (congestive) heart failure: Secondary | ICD-10-CM | POA: Diagnosis present

## 2016-07-31 HISTORY — DX: Heart failure, unspecified: I50.9

## 2016-07-31 LAB — CBC
HCT: 42.7 % (ref 39.0–52.0)
Hemoglobin: 13.4 g/dL (ref 13.0–17.0)
MCH: 27.4 pg (ref 26.0–34.0)
MCHC: 31.4 g/dL (ref 30.0–36.0)
MCV: 87.3 fL (ref 78.0–100.0)
Platelets: 140 10*3/uL — ABNORMAL LOW (ref 150–400)
RBC: 4.89 MIL/uL (ref 4.22–5.81)
RDW: 14.6 % (ref 11.5–15.5)
WBC: 8.9 10*3/uL (ref 4.0–10.5)

## 2016-07-31 LAB — BASIC METABOLIC PANEL
Anion gap: 8 (ref 5–15)
BUN: 13 mg/dL (ref 6–20)
CO2: 26 mmol/L (ref 22–32)
Calcium: 9.4 mg/dL (ref 8.9–10.3)
Chloride: 106 mmol/L (ref 101–111)
Creatinine, Ser: 1.4 mg/dL — ABNORMAL HIGH (ref 0.61–1.24)
GFR calc Af Amer: >60 mL/min (ref >60)
GFR calc non Af Amer: 56 mL/min — ABNORMAL LOW (ref >60)
Glucose, Bld: 103 mg/dL — ABNORMAL HIGH (ref 65–99)
Potassium: 4 mmol/L (ref 3.5–5.1)
Sodium: 140 mmol/L (ref 135–145)

## 2016-07-31 LAB — I-STAT TROPONIN, ED: Troponin i, poc: 0.04 ng/mL (ref 0.00–0.08)

## 2016-07-31 LAB — BRAIN NATRIURETIC PEPTIDE: B Natriuretic Peptide: 533.9 pg/mL — ABNORMAL HIGH (ref 0.0–100.0)

## 2016-07-31 LAB — TROPONIN I
Troponin I: 0.06 ng/mL (ref <0.03)
Troponin I: 0.05 ng/mL (ref <0.03)

## 2016-07-31 MED ORDER — ASPIRIN 81 MG PO CHEW
324.0000 mg | CHEWABLE_TABLET | Freq: Once | ORAL | Status: AC
  Administered 2016-07-31: 324 mg via ORAL
  Filled 2016-07-31: qty 4

## 2016-07-31 MED ORDER — ENOXAPARIN SODIUM 40 MG/0.4ML ~~LOC~~ SOLN
40.0000 mg | SUBCUTANEOUS | Status: DC
  Administered 2016-07-31: 40 mg via SUBCUTANEOUS
  Filled 2016-07-31: qty 0.4

## 2016-07-31 MED ORDER — NITROGLYCERIN 0.4 MG SL SUBL
0.4000 mg | SUBLINGUAL_TABLET | SUBLINGUAL | Status: DC | PRN
  Administered 2016-07-31: 0.4 mg via SUBLINGUAL
  Filled 2016-07-31: qty 1

## 2016-07-31 MED ORDER — ACETAMINOPHEN 325 MG PO TABS
650.0000 mg | ORAL_TABLET | Freq: Four times a day (QID) | ORAL | Status: DC | PRN
  Administered 2016-07-31: 650 mg via ORAL
  Filled 2016-07-31: qty 2

## 2016-07-31 MED ORDER — CARVEDILOL 12.5 MG PO TABS
12.5000 mg | ORAL_TABLET | Freq: Two times a day (BID) | ORAL | Status: DC
  Administered 2016-07-31 – 2016-08-01 (×2): 12.5 mg via ORAL
  Filled 2016-07-31 (×2): qty 1

## 2016-07-31 MED ORDER — ONDANSETRON HCL 4 MG/2ML IJ SOLN
4.0000 mg | Freq: Once | INTRAMUSCULAR | Status: AC | PRN
  Administered 2016-07-31: 4 mg via INTRAVENOUS
  Filled 2016-07-31: qty 2

## 2016-07-31 MED ORDER — FUROSEMIDE 20 MG PO TABS
40.0000 mg | ORAL_TABLET | Freq: Every day | ORAL | Status: DC
  Administered 2016-08-01: 40 mg via ORAL
  Filled 2016-07-31: qty 2

## 2016-07-31 MED ORDER — ASPIRIN EC 81 MG PO TBEC
81.0000 mg | DELAYED_RELEASE_TABLET | Freq: Every day | ORAL | Status: DC
  Administered 2016-07-31 – 2016-08-01 (×2): 81 mg via ORAL
  Filled 2016-07-31 (×2): qty 1

## 2016-07-31 MED ORDER — ISOSORBIDE MONONITRATE ER 30 MG PO TB24
30.0000 mg | ORAL_TABLET | Freq: Every day | ORAL | Status: DC
Start: 2016-08-01 — End: 2016-08-01
  Administered 2016-08-01: 30 mg via ORAL
  Filled 2016-07-31: qty 1

## 2016-07-31 MED ORDER — FUROSEMIDE 10 MG/ML IJ SOLN
60.0000 mg | Freq: Once | INTRAMUSCULAR | Status: AC
  Administered 2016-07-31: 60 mg via INTRAVENOUS
  Filled 2016-07-31: qty 6

## 2016-07-31 MED ORDER — ACETAMINOPHEN 650 MG RE SUPP: 650.0000 mg | Freq: Four times a day (QID) | RECTAL | Status: DC | PRN

## 2016-07-31 NOTE — Progress Notes (Signed)
Patient arrived to the floor at [place], alert and oriented, denies pain, no shortness of breath Trop elevated. MD paged awaiting MD call back. V/s stable will continue to monitor the patient.

## 2016-07-31 NOTE — ED Notes (Signed)
MD paged for room assignment change due to floor unable to take because of active CP 6/10

## 2016-07-31 NOTE — ED Provider Notes (Addendum)
Emergency Department Provider Note   I have reviewed the triage vital signs and the nursing notes.   HISTORY  Chief Complaint Chest Pain   HPI Bruce Robertson is a 55 y.o. male with PMH of systolic CHF ([id number]) s/p CABG for non-ischemic coronary dissection, HLD, and tobacco use who presents to the emergency department for evaluation of chest pain difficulty breathing starting yesterday. Patient states that he has had similar episodes in the past but this does not seem to be improving. He notes gradual progression of difficulty breathing. He has a pressure sensation in his chest with heavy breathing and feels as if he cannot get a deep breath. Patient is also having some sharp left-sided chest discomfort that is intermittent and nonexertional. No exacerbating or alleviating factors. The patient has been compliant with his Lasix, Imdur, and Coreg.   Past Medical History:  Diagnosis Date  . Cardiomyopathy    Nonischemic. EF has been about 45%. S/P CABG.;  b.  Echo 4/14: EF 25%, global HK with inf and mid apical AK, restrictive physiology with E/e' > 15 (elevated LV filling pressure), trivial AI/MR, mild to mod LAE, mild RVE, mild reduced RVSF, mild RAE, mod TR, PASP 74 (severe pulmonary HTN)   . CHF (congestive heart failure) ([place])   . Colon polyps 2002  . Coronary artery disease   . Dyslipidemia   . History of echocardiogram    Echo 10/16:  EF 25-30%, poss non-compaction, diff HK with inf-lat and apical HK, restrictive physio, mild AI, severe LAE, mild RVE with mild reduced RVSF, PASP 42 mmHg  . Hyperplastic rectal polyp   . Reflux   . Sleep apnea    Mild  . Tobacco abuse    Remote  . Ulcer    In GI tract    Patient Active Problem List   Diagnosis Date Noted  . CHF exacerbation ([place]) 07/31/2016  . Dyspnea on exertion 03/05/2016  . Essential hypertension 10/31/2015  . Acute on chronic systolic (congestive) heart failure ([place]) 08/29/2015  . Hx of CABG 08/29/2015  .  Chronic renal disease, stage III 08/29/2015  . Sleep apnea 06/10/2011  . Fatigue 06/10/2011  . OBESITY, UNSPECIFIED 05/02/2010  . HEMORRHOIDS, EXTERNAL 02/08/2010  . ESOPHAGEAL REFLUX 02/08/2010  . RECTAL BLEEDING 02/08/2010  . DYSLIPIDEMIA 04/06/2009  . TOBACCO USER 04/06/2009  . Non-ischemic cardiomyopathy ([place]) 04/06/2009    Past Surgical History:  Procedure Laterality Date  . CARDIAC CATHETERIZATION  2007 and 2009    x 2  . CORONARY ARTERY BYPASS GRAFT  2007   Had a left main dissection after catheterization. Underwent a saphenous vein graft to the LAD and a saphenous vein graft to obtuse marginal.  . LEG TENDON SURGERY  2005   Right patellar    Current Outpatient Rx  . Order #: [id number] Class: Normal  . Order #: [id number] Class: Normal  . Order #: [id number] Class: Normal  . Order #: [id number] Class: Normal    Allergies Review of patient's allergies indicates no known allergies.  Family History  Problem Relation Age of Onset  . Hypertension Mother   . Osteoarthritis Mother   . Heart failure Father   . Lung cancer Other   . Cancer Other     Social History Social History  Substance Use Topics  . Smoking status: Former Smoker    Quit date: 11/04/2004  . Smokeless tobacco: Never Used  . Alcohol use 0.0 oz/week    Review of Systems  Constitutional: No fever/chills Eyes: No visual changes.  ENT: No sore throat. Cardiovascular: Positive chest pain. Respiratory: Positive shortness of breath. Gastrointestinal: No abdominal pain. No nausea, no vomiting. No diarrhea. No constipation. Genitourinary: Negative for dysuria. Musculoskeletal: Negative for back pain. Skin: Negative for rash. Neurological: Negative for headaches, focal weakness or numbness.  10-point ROS otherwise negative.  ____________________________________________   PHYSICAL EXAM:  VITAL SIGNS: ED Triage Vitals  Enc Vitals Group     BP 07/31/16 1301 142/98     Pulse Rate 07/31/16 1301 76      Resp 07/31/16 1301 22     Temp 07/31/16 1301 98.7 F (37.1 C)     Temp Source 07/31/16 1301 Oral     SpO2 07/31/16 1301 99 %     Weight 07/31/16 1301 235 lb (106.6 kg)     Pain Score 07/31/16 1304 10   Constitutional: Alert and oriented. Well appearing and in no acute distress. Eyes: Conjunctivae are normal. Head: Atraumatic. Nose: No congestion/rhinnorhea. Mouth/Throat: Mucous membranes are moist.  Oropharynx non-erythematous. Neck: No stridor.  Cardiovascular: Normal rate, regular rhythm. Good peripheral circulation. Grossly normal heart sounds.   Respiratory: Increased respiratory effort. No retractions. Lungs with faint rales at the bases.  Gastrointestinal: Soft and nontender. No distention.  Musculoskeletal: No lower extremity tenderness nor edema. No gross deformities of extremities. Neurologic:  Normal speech and language. No gross focal neurologic deficits are appreciated.  Skin:  Skin is warm, dry and intact. No rash noted. Psychiatric: Mood and affect are normal. Speech and behavior are normal.  ____________________________________________   LABS (all labs ordered are listed, but only abnormal results are displayed)  Labs Reviewed  BASIC METABOLIC PANEL - Abnormal; Notable for the following:       Result Value   Glucose, Bld 103 (*)    Creatinine, Ser 1.40 (*)    GFR calc non Af Amer 56 (*)    All other components within normal limits  CBC - Abnormal; Notable for the following:    Platelets 140 (*)    All other components within normal limits  BRAIN NATRIURETIC PEPTIDE - Abnormal; Notable for the following:    B Natriuretic Peptide 533.9 (*)    All other components within normal limits  I-STAT TROPOININ, ED   ____________________________________________  EKG   EKG Interpretation  Date/Time:  Wednesday July 31 2016 13:07:23 EDT Ventricular Rate:  77 PR Interval:  128 QRS Duration: 94 QT Interval:  384 QTC Calculation: 434 R Axis:   94 Text  Interpretation:  Normal sinus rhythm Rightward axis Cannot rule out Inferior infarct , age undetermined Abnormal ECG No STEMI. Similar to prior.  Confirmed by Teairra Millar MD, Anslie Spadafora [id number]) on 07/31/2016 3:14:40 PM      ____________________________________________  RADIOLOGY  Dg Chest 2 View  Result Date: 07/31/2016 CLINICAL DATA:  Two days of lower left-sided chest pain, shortness of breath, and nausea and vomiting. History of cardiomyopathy, CHF, and coronary artery disease. Former smoker. EXAM: CHEST  2 VIEW COMPARISON:  PA and lateral chest x-ray of August 11, 2015 FINDINGS: The lungs are well-expanded. The interstitial markings are increased as compared to the previous study. The cardiac silhouette is slightly larger. The pulmonary vascularity is engorged centrally and there is mild cephalization of the vascular pattern. There is no pleural effusion. The sternal wires from previous CABG are intact. The bony thorax exhibits no acute abnormality. IMPRESSION: Mild pulmonary interstitial edema superimposed upon chronic bronchitic changes. No alveolar pneumonia. Electronically Signed   By: David  Martinique M.D.   On:  07/31/2016 13:42    ____________________________________________   PROCEDURES  Procedure(s) performed:   Procedures  None ____________________________________________   INITIAL IMPRESSION / ASSESSMENT AND PLAN / ED COURSE  Pertinent labs & imaging results that were available during my care of the patient were reviewed by me and considered in my medical decision making (see chart for details).  Patient resents to the emergency department for evaluation of gradually worsening difficulty breathing and left-sided chest pain. He has history of congestive heart failure and prior coronary artery disease. His follow closely by cardiology but does not have a primary care physician. He has been compliant with his home medications. The patient has some tachypnea with faint rales at the bases.  No appreciable lower extremity edema. Patient with elevated BNP, normal troponin, and CXR with some edema noted. No focal infiltrate to suggest PNA. Will give ASA and IV lasix. Trend biomarker. Plan for admission for IV diuresis.   Discussed patient's case with Dr. Storm Frisk team.  Recommend admission to obs, telemetry bed.  I will place holding orders per their request. Patient and family (if present) updated with plan. Care transferred to medicine service.  I reviewed all nursing notes, vitals, pertinent old records, EKGs, labs, imaging (as available).  ____________________________________________  FINAL CLINICAL IMPRESSION(S) / ED DIAGNOSES  Final diagnoses:  Acute on chronic systolic congestive heart failure (HCC)  Nonspecific chest pain     MEDICATIONS GIVEN DURING THIS VISIT:  Medications  furosemide (LASIX) injection 60 mg (not administered)  aspirin chewable tablet 324 mg (not administered)  ondansetron (ZOFRAN) injection 4 mg (4 mg Intravenous Given 07/31/16 1502)     NEW OUTPATIENT MEDICATIONS STARTED DURING THIS VISIT:  None   Note:  This document was prepared using Dragon voice recognition software and may include unintentional dictation errors.  Nanda Quinton, MD Emergency Medicine   Margette Fast, MD 07/31/16 [place], MD 07/31/16 [phone]

## 2016-07-31 NOTE — H&P (Signed)
Date: 07/31/2016               Patient Name:  Bruce Robertson MRN: [id number]  DOB: [birth year] Age / Sex: 55 y.o., male   PCP: No Pcp Per Patient         Medical Service: Internal Medicine Teaching Service         Attending Physician: Dr. Carlyle Basques, MD    First Contact: Dr. Ophelia Shoulder Pager: [phone]  Second Contact: Dr. Julious Oka Pager: [phone]       After Hours (After 5p/  First Contact Pager: [phone]  weekends / holidays): Second Contact Pager: [phone]   Chief Complaint: Orthopnea  History of Present Illness: Mr. Muhammed is a 55 year old male with a history of  congestive heart failure ( EF20-30%) s/p CABG, coronary artery disease, dyslipidemia and obstructive sleep apnea who presents with difficulty breathing. The patient states that over the past several weeks he has had increased shortness of breath. He normally uses 2-1/2 pillows to sleep with at night but last night he became extremely orthopneic and was not able to sleep 2/2 shortness of breath. He endorses a gradual progression of dyspnea on exertion and orthopnea over the previous several weeks. The patient also states that over the last few days he has felt nauseated with stomach pain and has had approximately 10 episodes of nonbloody nonbilious emesis. The patient also endorses a several week history of diarrhea, headache and subjective fevers and chills. The patient had chest pain when he initially presented to the emergency department but this has resolved and he denied chest pain during our examination. He denies recent sick contacts or other exposures. Importantly, he saw his cardiologist on 9/201/2017 and his Lasix was reduced to prn dosing 2/2 hypotension.  In the emergency department the patient was afebrile and hemodynamically stable. Labs were significant for a creatinine of 1.40 and elevated BNP at 533.9 (previous of 262.4). Troponin was ordered and was within normal limits. Chest x-ray demonstrated mild  pulmonary interstitial edema superimposed upon chronic bronchitic changes without evidence of alveolar pneumonia. He was admitted to the [facility] internal medicine teaching service for treatment of a congestive heart failure exacerbation.  Meds:  No outpatient prescriptions have been marked as taking for the 07/31/16 encounter [facility] Encounter).     Allergies: Allergies as of 07/31/2016  . (No Known Allergies)   Past Medical History:  Diagnosis Date  . Cardiomyopathy    Nonischemic. EF has been about 45%. S/P CABG.;  b.  Echo 4/14: EF 25%, global HK with inf and mid apical AK, restrictive physiology with E/e' > 15 (elevated LV filling pressure), trivial AI/MR, mild to mod LAE, mild RVE, mild reduced RVSF, mild RAE, mod TR, PASP 74 (severe pulmonary HTN)   . CHF (congestive heart failure) ([place])   . Colon polyps 2002  . Coronary artery disease   . Dyslipidemia   . History of echocardiogram    Echo 10/16:  EF 25-30%, poss non-compaction, diff HK with inf-lat and apical HK, restrictive physio, mild AI, severe LAE, mild RVE with mild reduced RVSF, PASP 42 mmHg  . Hyperplastic rectal polyp   . Reflux   . Sleep apnea    Mild  . Tobacco abuse    Remote  . Ulcer    In GI tract    Family History:  1. Mother- HTN 2. Father- Heart Failure  Social History: Former smoker, endorses alcohol use, denies drug use  Review of Systems: A  complete ROS was negative except as per HPI.   Physical Exam: Blood pressure 140/83, pulse 72, temperature 98.7 F (37.1 C), temperature source Oral, resp. rate 22, weight 235 lb (106.6 kg), SpO2 96 %. Physical Exam  Constitutional: He is oriented to person, place, and time. He appears well-developed and well-nourished.  In no acute distress, patient appeared extremely drowsy during the examination.  HENT:  Head: Normocephalic and atraumatic.  Cardiovascular: Normal rate and regular rhythm.  Exam reveals no gallop and no friction rub.   No murmur  heard. Respiratory: Effort normal. He has wheezes.  Patient with mild wheezes in the upper lung fields. Additionally very minimal crackles heard in the bases bilaterally  GI: Soft. Bowel sounds are normal. He exhibits no distension. There is no tenderness.  Musculoskeletal: He exhibits no edema.  Neurological: He is alert and oriented to person, place, and time.     EKG: Sinus rhythm  CXR: Mild pulmonary interstitial edema superimposed upon chronic bronchitic changes.  Assessment & Plan by Problem: Mr. Petsch is a 55 year old male with a history of  congestive heart failure (EF20-30%) s/p CABG, coronary artery disease, dyslipidemia and obstructive sleep apnea who presents with worsening orthopnea following reduction and furosemide dosage.  1. Congestive heart failure exacerbation The patient recently saw his cardiologist on 07/25/2016 and his furosemide was reduced secondary to hypotension. He presents with increasing dyspnea on exertion and orthopnea. He has an elevated BNP and a chest x-ray concerning for bibasilar pulmonary edema. He has had an approximate 4 pound weight gain. -- Lasix IV 60 mg once in the emergency department -- Resume home dose 40 mg Lasix tomorrow -- Carvedilol 12.5 mg twice a day -- Aspirin 81 mg once daily -- Isosorbide mononitrate 30 mg to start tomorrow -- Hemoglobin A1c -- Lipid panel -- Comprehensive metabolic panel tomorrow  2. Subjective fever and diarrhea Patient reports several week history of fevers, chills and diarrhea. Patient afebrile on admission. No leukocytosis. Patient appears volume overloaded on examination not dehydrated. -- Monitor fever curve -- CBC tomorrow  3. Obstructive sleep apnea Patient has a history of obstructive sleep apnea and uses CPAP at home. -- CPAP  4. DVT/PE prophylaxis -- Lovenox 40 mg subcutaneous injection once daily  Dispo: Admit patient to Observation with expected length of stay less than 2  midnights.  Signed: Ophelia Shoulder, MD 07/31/2016, 4:32 PM  Pager: [phone]

## 2016-07-31 NOTE — ED Notes (Signed)
Report attempted, RN notified that patient is pain free at this time

## 2016-07-31 NOTE — Progress Notes (Signed)
Pt had run 13 beats of Vtach, denies chest pain but c/o headache and nausea. Dr. Jari Favre was notified. Will continue to monitor pt

## 2016-07-31 NOTE — ED Triage Notes (Signed)
Patient complains of chest pain with shortness of breath since yesterday. Has hx of failure and taking lasix as prescribed. Patient denies cold symptoms, no cough, appears anxious on arrival. Family states hx of anxiety. No obvious edema, no wheezing noted

## 2016-08-01 ENCOUNTER — Encounter (HOSPITAL_COMMUNITY): Payer: Self-pay | Admitting: Nurse Practitioner

## 2016-08-01 ENCOUNTER — Telehealth (HOSPITAL_COMMUNITY): Payer: Self-pay | Admitting: Surgery

## 2016-08-01 DIAGNOSIS — I2510 Atherosclerotic heart disease of native coronary artery without angina pectoris: Secondary | ICD-10-CM | POA: Diagnosis not present

## 2016-08-01 DIAGNOSIS — N183 Chronic kidney disease, stage 3 (moderate): Secondary | ICD-10-CM | POA: Diagnosis not present

## 2016-08-01 DIAGNOSIS — I130 Hypertensive heart and chronic kidney disease with heart failure and stage 1 through stage 4 chronic kidney disease, or unspecified chronic kidney disease: Secondary | ICD-10-CM | POA: Diagnosis not present

## 2016-08-01 DIAGNOSIS — I5023 Acute on chronic systolic (congestive) heart failure: Secondary | ICD-10-CM | POA: Diagnosis not present

## 2016-08-01 LAB — LIPID PANEL
Cholesterol: 200 mg/dL (ref 0–200)
HDL: 49 mg/dL (ref >40)
LDL Cholesterol: 136 mg/dL — ABNORMAL HIGH (ref 0–99)
Total CHOL/HDL Ratio: 4.1 RATIO
Triglycerides: 75 mg/dL (ref <150)
VLDL: 15 mg/dL (ref 0–40)

## 2016-08-01 LAB — HEMOGLOBIN A1C
Hgb A1c MFr Bld: 6.1 % — ABNORMAL HIGH (ref 4.8–5.6)
Mean Plasma Glucose: 128 mg/dL

## 2016-08-01 LAB — COMPREHENSIVE METABOLIC PANEL
ALT: 25 U/L (ref 17–63)
AST: 20 U/L (ref 15–41)
Albumin: 3.5 g/dL (ref 3.5–5.0)
Alkaline Phosphatase: 46 U/L (ref 38–126)
Anion gap: 10 (ref 5–15)
BUN: 14 mg/dL (ref 6–20)
CO2: 27 mmol/L (ref 22–32)
Calcium: 9.3 mg/dL (ref 8.9–10.3)
Chloride: 102 mmol/L (ref 101–111)
Creatinine, Ser: 1.49 mg/dL — ABNORMAL HIGH (ref 0.61–1.24)
GFR calc Af Amer: >60 mL/min (ref >60)
GFR calc non Af Amer: 52 mL/min — ABNORMAL LOW (ref >60)
Glucose, Bld: 103 mg/dL — ABNORMAL HIGH (ref 65–99)
Potassium: 3.8 mmol/L (ref 3.5–5.1)
Sodium: 139 mmol/L (ref 135–145)
Total Bilirubin: 1.5 mg/dL — ABNORMAL HIGH (ref 0.3–1.2)
Total Protein: 6.8 g/dL (ref 6.5–8.1)

## 2016-08-01 LAB — RAPID URINE DRUG SCREEN, HOSP PERFORMED
Amphetamines: NOT DETECTED
Barbiturates: NOT DETECTED
Benzodiazepines: NOT DETECTED
Cocaine: NOT DETECTED
Opiates: NOT DETECTED
Tetrahydrocannabinol: POSITIVE — AB

## 2016-08-01 LAB — HEPATITIS C ANTIBODY: HCV Ab: 0.1 s/co ratio (ref 0.0–0.9)

## 2016-08-01 LAB — HIV ANTIBODY (ROUTINE TESTING W REFLEX): HIV Screen 4th Generation wRfx: NONREACTIVE

## 2016-08-01 LAB — TROPONIN I: Troponin I: 0.05 ng/mL (ref <0.03)

## 2016-08-01 MED ORDER — FUROSEMIDE 20 MG PO TABS: 20.0000 mg | ORAL_TABLET | Freq: Every day | ORAL | 3 refills | Status: DC

## 2016-08-01 MED ORDER — FUROSEMIDE 20 MG PO TABS: 20.0000 mg | ORAL_TABLET | Freq: Every day | ORAL | Status: DC

## 2016-08-01 MED ORDER — ASPIRIN 81 MG PO TBEC: 81.0000 mg | DELAYED_RELEASE_TABLET | Freq: Every day | ORAL | 3 refills | Status: DC

## 2016-08-01 NOTE — Discharge Summary (Signed)
Name: Bruce Robertson MRN: [id number] DOB: 1961/04/05 55 y.o. PCP: No Pcp Per Patient  Date of Admission: 07/31/2016  2:33 PM Date of Discharge: 08/01/2016 Attending Physician: No att. providers found  Discharge Diagnosis: 1. CHF exacerbation   Discharge Medications:   Medication List    TAKE these medications   aspirin 81 MG EC tablet Take 1 tablet (81 mg total) by mouth daily. Start taking on:  08/02/2016   carvedilol 12.5 MG tablet Commonly known as:  COREG TAKE ONE TABLET BY MOUTH TWICE DAILY WITH MEALS   furosemide 20 MG tablet Commonly known as:  LASIX Take 1 tablet (20 mg total) by mouth daily. Start taking on:  08/02/2016 What changed:  medication strength  how much to take  when to take this   isosorbide mononitrate 30 MG 24 hr tablet Commonly known as:  IMDUR Take 1 tablet (30 mg total) by mouth daily.       Disposition and follow-up:   Bruce Robertson was discharged from [facility] in Good condition.  At the hospital follow up visit please address:  1.  Please ensure the patient follows up with his cardiologist.  2.  Labs / imaging needed at time of follow-up: Basic metabolic panel  3.  Pending labs/ test needing follow-up: None  Follow-up Appointments: 1. Scheduled appointment with his cardiologist   Hospital Course by problem list:   1. Congestive heart failure exacerbation   The patient presented to the [facility] emergency department on 07/31/2016 with increased shortness of breath. He states he normally uses 2-1/2 pillows to sleep at night but the night prior to presentation became extremely orthopneic and was not able to sleep secondary to shortness of breath. He endorses a gradual progression of dyspnea on exertion and orthopnea over the previous several weeks. He also states over the last few days he has felt nauseated with stomach pain and endorsed approximately 10 episodes of nonbloody nonbilious emesis. Importantly  he recently saw his cardiologist on 07/25/2016 and his Lasix was reduced to when necessary dosing secondary to hypotension. In the emergency department the patient was afebrile and hemodynamically stable. Labs were significant for creatinine of 1.40 and elevated BNP at 533.9. Chest x-ray demonstrated mild pulmonary interstitial edema superimposed upon chronic bronchitic changes without evidence of an alveolar pneumonia. He was admitted to the [facility] internal medicine teaching service for further management of his shortness of breath 2/2 a congestive heart failure exacerbation.  The patient was treated with a single dose of 60 mg IV Lasix with appropriate diuresis. The patient also had an elevated troponin which peaked at 0.06. He denied chest pain during his inpatient stay. He did report mild chest pain upon presentation to the emergency department but this had resolved prior to admission. The patient stated that following his IV Lasix his shortness of breath greatly improved and he was able to sleep without the need for additional pillows. He stated that this is the best he has felt in quite some time. Additionally, the patient stated that his nausea, abdominal pain and diarrhea also resolved. We started the patient back on 20 mg Lasix once daily. We advised the patient to weigh every morning and if his weight increased by 2-3 pounds to double his Lasix for a single day to 40 mg. He endorsed understanding of this treatment plan. Before discharge I called his cardiology office and have arranged for him to follow-up in clinic in early October. At the time of discharge  patient was back to his baseline health, denied orthopnea, denied dyspnea on exertion, was hemodynamically stable and medically appropriate for discharge.  2. Subjective fever and diarrhea On presentation the patient endorsed a several week history of fevers, chills and diarrhea. The patient denied recent travel or sick contact exposure. During  his admission he remained afebrile without leukocytosis. Additionally, the patient has gained weight and appeared volume overloaded on examination of dehydration. It appeared that he had no infectious etiology to cause the symptoms. Following diuresis for his congestive heart failure exacerbation the symptoms resolved. At the time of discharge the patient was denying feeling febrile and denied nausea, vomiting or abdominal pain.  Discharge Vitals:   BP (!) 110/59 (BP Location: Left Arm)   Pulse (!) 54   Temp 98.2 F (36.8 C) (Oral)   Resp 18   Ht 6\' 1"  (1.854 m)   Wt 224 lb 3.2 oz (101.7 kg)   SpO2 99%   BMI 29.58 kg/m   Pertinent Labs, Studies, and Procedures:  1. Chest x-ray-mild pulmonary interstitial edema superimposed upon chronic bronchitic changes without evidence of alveolar pneumonia 2. 2 EKGs without evidence of acute ST segment elevation  Discharge Instructions: Discharge Instructions    Diet - low sodium heart healthy    Complete by:  As directed    Discharge instructions    Complete by:  As directed    We have changed the dosing of your lasix. Please take 20 mg once a day every day until you see your cardiologist. Additionally, please weigh yourself every morning and if you have a weight gain of 3-4 pounds please double your dose for a single day to 40 mg. To recap, take one 20 mg Lasix every day. Ensure you weigh yourself every morning. If your weight increases by 3-4 pounds please double your dose to 40 mg for a single day.  Please continue to take the rest of your medications as prescribed. I have scheduled you an appointment to follow-up with your cardiologist on 08/08/2016 at 9:30 AM. Please ensure you make this appointment.  If you become short of breath or have difficulty breathing or feel dizzy or if your heart is racing please immediately call your cardiologist.   Increase activity slowly    Complete by:  As directed       Signed: Ophelia Shoulder, MD 08/01/2016,  2:49 PM   Pager: [phone]

## 2016-08-01 NOTE — Telephone Encounter (Signed)
I called patient and verified follow-up appt in the [facility] on 08/08/16 at 2:20pm.  He acknowledges and plans to be at appointment.

## 2016-08-01 NOTE — Progress Notes (Signed)
   Subjective: No acute events overnight. Patient states that he is feeling much improved. He did not have any orthopnea. He states that his shortness of breath, fatigue and abdominal pain have improved. He had no additional questions this morning.  Objective:  Vital signs in last 24 hours: Vitals:   08/01/16 0034 08/01/16 0626 08/01/16 0750 08/01/16 0823  BP:  99/76  (!) 100/59  Pulse: 64 (!) 50 60 (!) 48  Resp: 16 16  16   Temp:  98.5 F (36.9 C)  98.4 F (36.9 C)  TempSrc:  Oral  Oral  SpO2: 96% 98%  99%  Weight:  224 lb 3.2 oz (101.7 kg)    Height:       Physical Exam  Constitutional: He is oriented to person, place, and time. He appears well-developed and well-nourished.  Resting comfortably, much improved from yesterday.  Neck:  No jugular venous distention  Cardiovascular: Normal rate and regular rhythm.  Exam reveals no gallop and no friction rub.   No murmur heard. Respiratory: Effort normal and breath sounds normal. No respiratory distress. He has no wheezes.  Crackles from previous examination are no longer present  GI: Soft. Bowel sounds are normal. He exhibits no distension. There is no tenderness.  No abdominal or renal bruits auscultated  Musculoskeletal: He exhibits no edema.  Neurological: He is oriented to person, place, and time.     Assessment/Plan: Mr. Scholes is a 55 year old male with a history of  congestive heart failure (EF20-30%) s/p CABG, coronary artery disease, dyslipidemia and obstructive sleep apnea who presents with worsening orthopnea following reduction of furosemide dosage.  1. Congestive heart failure exacerbation, Resolved The patient recently saw his cardiologist on 07/25/2016 and his furosemide was reduced secondary to hypotension. He presents with increasing dyspnea on exertion and orthopnea. He has an elevated BNP and a chest x-ray concerning for bibasilar pulmonary edema. He has had an approximate 4 pound weight gain. -- Start Lasix  20 mg once daily, if he gains more than 2-3 pounds he is to double his dose  to 40 mg for a single day than continue on 20mg  the following day -- Follow-up with cardiology on 08/08/2016 -- Carvedilol 12.5 mg twice a day -- Aspirin 81 mg once daily -- Repeat EKG on 08/01/2016 without acute ST segment elevation   2. Subjective fever and diarrhea, resolved Patient reports several week history of fevers, chills and diarrhea. Patient afebrile on admission. No leukocytosis. Patient appears volume overloaded on examination not dehydrated. -- Monitor fever curve   3. Obstructive sleep apnea Patient has a history of obstructive sleep apnea and uses CPAP at home. -- CPAP  4. DVT/PE prophylaxis -- Lovenox 40 mg subcutaneous injection once daily  Dispo: Discharged today.   Ophelia Shoulder, MD 08/01/2016, 12:54 PM Pager: [phone]

## 2016-08-01 NOTE — Progress Notes (Signed)
Patient given discharge instructions and all questions answered.  Patient discharged via wheelchair with all belongings.   

## 2016-08-01 NOTE — Progress Notes (Signed)
Heart Failure Navigator Consult Note  Presentation: Bruce Robertson is a 55 year old male with a history of  congestive heart failure ( EF20-30%) s/p CABG, coronary artery disease, dyslipidemia and obstructive sleep apnea who presents with difficulty breathing. The patient states that over the past several weeks he has had increased shortness of breath. He normally uses 2-1/2 pillows to sleep with at night but last night he became extremely orthopneic and was not able to sleep 2/2 shortness of breath. He endorses a gradual progression of dyspnea on exertion and orthopnea over the previous several weeks. The patient also states that over the last few days he has felt nauseated with stomach pain and has had approximately 10 episodes of nonbloody nonbilious emesis. The patient also endorses a several week history of diarrhea, headache and subjective fevers and chills. The patient had chest pain when he initially presented to the emergency department but this has resolved and he denied chest pain during our examination. He denies recent sick contacts or other exposures. Importantly, he saw his cardiologist on 07/24/2016 and his Lasix was reduced to prn dosing 2/2 hypotension.  Past Medical History:  Diagnosis Date  . Cardiomyopathy    Nonischemic. EF has been about 45%. S/P CABG.;  b.  Echo 4/14: EF 25%, global HK with inf and mid apical AK, restrictive physiology with E/e' > 15 (elevated LV filling pressure), trivial AI/MR, mild to mod LAE, mild RVE, mild reduced RVSF, mild RAE, mod TR, PASP 74 (severe pulmonary HTN)   . CHF (congestive heart failure) ([place])   . Colon polyps 2002  . Coronary artery disease   . Dyslipidemia   . History of echocardiogram    Echo 10/16:  EF 25-30%, poss non-compaction, diff HK with inf-lat and apical HK, restrictive physio, mild AI, severe LAE, mild RVE with mild reduced RVSF, PASP 42 mmHg  . Hyperplastic rectal polyp   . Reflux   . Sleep apnea    Mild  . Tobacco abuse     Remote  . Ulcer    In GI tract    Social History   Social History  . Marital status: Single    Spouse name: N/A  . Number of children: N/A  . Years of education: N/A   Occupational History  . 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 Smoking status: Former Smoker    Quit date: 11/04/2004  . Smokeless tobacco: Never Used  . Alcohol use 0.0 oz/week  . Drug use: No  . Sexual activity: Not Asked   Other Topics Concern  . None   Social History Narrative   Single    ECHO:Study Conclusions--04/16/16  - Left ventricle: The cavity size was severely dilated. Wall   thickness was normal. Systolic function was severely reduced. The   estimated ejection fraction was in the range of 25% to 30%. LV   apical false tendon. Diffuse hypokinesis. Doppler parameters are   consistent with restrictive left ventricular relaxation (grade 3   diastolic dysfunction). The E/e&' ratio is >15, suggesting   elevated LV filling pressure. - Aortic valve: Trileaflet. Sclerosis without stenosis. There was   mild regurgitation. - Mitral valve: Mildly thickened leaflets . There was trivial   regurgitation. - Left atrium: Moderately dilated. - Right ventricle: The cavity size was mildly dilated. Systolic   function was reduced. - Right atrium: The atrium was mildly dilated. - Tricuspid valve: There was mild regurgitation. - Pulmonary arteries: PA peak pressure: 38 mm Hg (S). -  Inferior vena cava: The vessel was normal in size. The   respirophasic diameter changes were in the normal range (>= 50%),   consistent with normal central venous pressure.  Impressions:  - Compared to a prior study in 01/2016, there has been no   significant improvement.  BNP    Component Value Date/Time   BNP 533.9 (H) 07/31/2016 1314   BNP 262.4 (H) 02/19/2016 1053    ProBNP    Component Value Date/Time   PROBNP 152.0 (H) 03/25/2013 1102     Education Assessment and  Provision:  Detailed education and instructions provided on heart failure disease management including the following:  Signs and symptoms of Heart Failure When to call the physician Importance of daily weights Low sodium diet Fluid restriction Medication management Anticipated future follow-up appointments  Patient education given on each of the above topics.  Patient acknowledges understanding and acceptance of all instructions.  I spoke with Bruce Robertson and his sign. other.  He tells me that his Lasix was recently stopped due to the fact that his BP was "so low".  He had increased swelling and could feel the "fluid in his chest" and decided to come in.  He says that he feels much better today and is actually about to be discharged.  He has a scale and weighs each day.  We reviewed when to contact the physician related to daily weights.  I reviewed a low sodium diet and high sodium foods to avoid. He denies any issues getting or taking prescribed medications.  He would benefit from [facility] Team referral for outpatient follow-up.  Education Materials:  "Living Better With Heart Failure" Booklet, Daily Weight Tracker Tool .   High Risk Criteria for Readmission and/or Poor Patient Outcomes:  (Recommend Follow-up with Advanced [facility] Failure Clinic)--yes  EF <30%-25-30%  2 or more admissions in 6 months- No  Difficult social situation- No  Demonstrates medication noncompliance- denies    Barriers of Care:  Knowledge and compliance  Discharge Planning:   Plans to return to home with sign. other  :

## 2016-08-06 ENCOUNTER — Telehealth (HOSPITAL_COMMUNITY): Payer: Self-pay | Admitting: Surgery

## 2016-08-06 NOTE — Telephone Encounter (Signed)
Bruce Robertson called to confirm his appointment for next for Thursday at 2:20 pm this week.  He is aware and is planning to keep his appt.

## 2016-08-08 ENCOUNTER — Encounter (HOSPITAL_COMMUNITY): Payer: Self-pay | Admitting: Internal Medicine

## 2016-08-08 ENCOUNTER — Ambulatory Visit: Payer: Medicaid Other | Admitting: Physician Assistant

## 2016-08-08 ENCOUNTER — Ambulatory Visit (HOSPITAL_COMMUNITY)
Admission: RE | Admit: 2016-08-08 | Discharge: 2016-08-08 | Disposition: A | Discharge status: Home / Self Care | Payer: Medicaid Other | Source: Ambulatory Visit | Attending: Internal Medicine | Admitting: Internal Medicine

## 2016-08-08 VITALS — BP 114/70 | HR 65 | Wt 236.5 lb

## 2016-08-08 DIAGNOSIS — I130 Hypertensive heart and chronic kidney disease with heart failure and stage 1 through stage 4 chronic kidney disease, or unspecified chronic kidney disease: Secondary | ICD-10-CM | POA: Insufficient documentation

## 2016-08-08 DIAGNOSIS — Z79899 Other long term (current) drug therapy: Secondary | ICD-10-CM | POA: Diagnosis not present

## 2016-08-08 DIAGNOSIS — I5022 Chronic systolic (congestive) heart failure: Secondary | ICD-10-CM

## 2016-08-08 DIAGNOSIS — G4733 Obstructive sleep apnea (adult) (pediatric): Secondary | ICD-10-CM | POA: Diagnosis not present

## 2016-08-08 DIAGNOSIS — Z7982 Long term (current) use of aspirin: Secondary | ICD-10-CM | POA: Diagnosis not present

## 2016-08-08 DIAGNOSIS — I429 Cardiomyopathy, unspecified: Secondary | ICD-10-CM | POA: Diagnosis not present

## 2016-08-08 DIAGNOSIS — Z951 Presence of aortocoronary bypass graft: Secondary | ICD-10-CM | POA: Insufficient documentation

## 2016-08-08 DIAGNOSIS — I2510 Atherosclerotic heart disease of native coronary artery without angina pectoris: Secondary | ICD-10-CM | POA: Diagnosis not present

## 2016-08-08 DIAGNOSIS — K219 Gastro-esophageal reflux disease without esophagitis: Secondary | ICD-10-CM | POA: Insufficient documentation

## 2016-08-08 DIAGNOSIS — N183 Chronic kidney disease, stage 3 unspecified: Secondary | ICD-10-CM

## 2016-08-08 DIAGNOSIS — E785 Hyperlipidemia, unspecified: Secondary | ICD-10-CM | POA: Diagnosis not present

## 2016-08-08 MED ORDER — SPIRONOLACTONE 25 MG PO TABS: 12.5000 mg | ORAL_TABLET | Freq: Every day | ORAL | 3 refills | Status: DC

## 2016-08-08 NOTE — Patient Instructions (Signed)
Start Spironolactone 12.5 mg (1/2 tablet) daily.  Your provider requests you have a Cardiopulmonary Exercise test.  Labs in 1 week.  Follow up with Dr.Bensimhon in 2 months

## 2016-08-08 NOTE — Progress Notes (Signed)
ADVANCED HF CLINIC NOTE  HPI  Bruce Robertson is a 55 y/o male with h/o HTN, nonischemic CM, OSA on CPAP referred by Dr. [name] for enrollment into the HF Clinic.   Years ago he had an ejection fraction of 40%. 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 He did end up requiring bypass because during his diagnostic catheterization he had dissection of his left main vessel. His ejection fraction eventually improved to about 45% with medications. The etiology was thought probably to be hypertensive.    Last echo 6/17 EF 25-30%.  Admitted at the end of September with HF decompensation. Diuresed and improved. Weight on d/c 224 pounds. Now using CPAP. Says he has good days and bad days. Gets SOB with just mild activity. No CP. No edema, orthopnea or PND. Says he is just now getting a better handle on how to manage his fluid. Weighing himself every day. Weight 232 at home. Now taking lasix 40 mg daily. Takes extra about 1x/week. Was struggling with low BP and lasix cut back to PRN and that when he got volume oveloaded.    No Known Allergies  Current Outpatient Prescriptions  Medication Sig Dispense Refill  . aspirin EC 81 MG EC tablet Take 1 tablet (81 mg total) by mouth daily. 30 tablet 3  . carvedilol (COREG) 12.5 MG tablet TAKE ONE TABLET BY MOUTH TWICE DAILY WITH MEALS 60 tablet 3  . furosemide (LASIX) 20 MG tablet Take 1 tablet (20 mg total) by mouth daily. 30 tablet 3  . isosorbide mononitrate (IMDUR) 30 MG 24 hr tablet Take 1 tablet (30 mg total) by mouth daily. 30 tablet 11   No current facility-administered medications for this encounter.     Past Medical History:  Diagnosis Date  . Cardiomyopathy    Nonischemic. EF has been about 45%. S/P CABG.;  b.  Echo 4/14: EF 25%, global HK with inf and mid apical AK, restrictive physiology with E/e' > 15 (elevated LV filling pressure), trivial AI/MR, mild to mod LAE, mild RVE, mild reduced RVSF, mild RAE, mod TR, PASP 74 (severe pulmonary HTN)   . CHF  (congestive heart failure) ([place])   . Colon polyps 2002  . Coronary artery disease   . Dyslipidemia   . History of echocardiogram    Echo 10/16:  EF 25-30%, poss non-compaction, diff HK with inf-lat and apical HK, restrictive physio, mild AI, severe LAE, mild RVE with mild reduced RVSF, PASP 42 mmHg  . Hyperplastic rectal polyp   . Reflux   . Sleep apnea    Mild  . Tobacco abuse    Remote  . Ulcer ([place])    In GI tract    Past Surgical History:  Procedure Laterality Date  . CARDIAC CATHETERIZATION  2007 and 2009    x 2  . CORONARY ARTERY BYPASS GRAFT  2007   Had a left main dissection after catheterization. Underwent a saphenous vein graft to the LAD and a saphenous vein graft to obtuse marginal.  . LEG TENDON SURGERY  2005   Right patellar    ROS:   As stated in the HPI and negative for all other systems.  PHYSICAL EXAM BP 114/70   Pulse 65   Wt 236 lb 8 oz (107.3 kg)   SpO2 100%   BMI 31.20 kg/m  GENERAL:  Well appearing NECK:  No jugular venous distention, waveform within normal limits, carotid upstroke brisk and symmetric, no bruits, no thyromegaly LUNGS:  Clear to auscultation bilaterally CHEST:  Well healed sternotomy scar. HEART:  PMI not displaced or sustained,S1 and S2 within normal limits, no S3, no S4, no clicks, no rubs, no murmurs ABD:  Flat, positive bowel sounds normal in frequency in pitch, no bruits, no rebound, no guarding, no midline pulsatile mass, no hepatomegaly, no splenomegaly EXT:  2 plus pulses throughout, no edema, no cyanosis no clubbing   ASSESSMENT AND PLAN:  1. Chronic systolic HF due to NICM. EF 25-30% echo 6/17 --NYHA III. Volume status look good.  --Will get CPX test to objectively measure functional capacity and know exactly where he is at --On carvedilol 12.5 bid --Previously intolerant to ACE/ARB due to low BP --Will start low dose spiro 12.5 daily --BMET 1 week --Would hold off on ICD until we see response to better meds and  CPAP. Has echo scheduled for next month.  2. CAD  --Normal coronaries. S/p LM dissection during cath and now with CABG  3. OSA on CPAP   --continue CPAP  4. CKD, stage III --baseline creatinine 1.4-15  Bensimhon, Daniel,MD 2:43 PM

## 2016-08-08 NOTE — Addendum Note (Signed)
Encounter addended by: Harvie Junior, CMA on: 08/08/2016  2:49 PM<BR>    Actions taken: Order Entry activity accessed, Diagnosis association updated, Sign clinical note

## 2016-08-16 ENCOUNTER — Ambulatory Visit (HOSPITAL_COMMUNITY)
Admission: RE | Admit: 2016-08-16 | Discharge: 2016-08-16 | Disposition: A | Discharge status: Home / Self Care | Payer: Medicaid Other | Source: Ambulatory Visit | Attending: Cardiology | Admitting: Cardiology

## 2016-08-16 DIAGNOSIS — I5022 Chronic systolic (congestive) heart failure: Secondary | ICD-10-CM | POA: Diagnosis not present

## 2016-08-16 LAB — BASIC METABOLIC PANEL
Anion gap: 7 (ref 5–15)
BUN: 18 mg/dL (ref 6–20)
CO2: 27 mmol/L (ref 22–32)
Calcium: 9.6 mg/dL (ref 8.9–10.3)
Chloride: 106 mmol/L (ref 101–111)
Creatinine, Ser: 1.44 mg/dL — ABNORMAL HIGH (ref 0.61–1.24)
GFR calc Af Amer: >60 mL/min (ref >60)
GFR calc non Af Amer: 54 mL/min — ABNORMAL LOW (ref >60)
Glucose, Bld: 108 mg/dL — ABNORMAL HIGH (ref 65–99)
Potassium: 3.8 mmol/L (ref 3.5–5.1)
Sodium: 140 mmol/L (ref 135–145)

## 2016-08-20 DIAGNOSIS — F419 Anxiety disorder, unspecified: Secondary | ICD-10-CM

## 2016-08-20 DIAGNOSIS — F331 Major depressive disorder, recurrent, moderate: Secondary | ICD-10-CM | POA: Insufficient documentation

## 2016-08-20 HISTORY — DX: Major depressive disorder, recurrent, moderate: F33.1

## 2016-08-20 HISTORY — DX: Anxiety disorder, unspecified: F41.9

## 2016-08-21 ENCOUNTER — Encounter (HOSPITAL_COMMUNITY): Payer: Medicaid Other

## 2016-09-04 ENCOUNTER — Ambulatory Visit (HOSPITAL_COMMUNITY): Payer: Medicaid Other | Attending: Cardiology

## 2016-09-04 ENCOUNTER — Other Ambulatory Visit (HOSPITAL_COMMUNITY): Payer: Self-pay | Admitting: *Deleted

## 2016-09-04 ENCOUNTER — Encounter (HOSPITAL_COMMUNITY): Payer: Self-pay | Admitting: *Deleted

## 2016-09-04 DIAGNOSIS — I5022 Chronic systolic (congestive) heart failure: Secondary | ICD-10-CM

## 2016-09-08 ENCOUNTER — Encounter: Payer: Self-pay | Admitting: Cardiovascular Disease

## 2016-10-05 ENCOUNTER — Other Ambulatory Visit: Payer: Self-pay | Admitting: Cardiology

## 2016-10-08 ENCOUNTER — Ambulatory Visit (HOSPITAL_COMMUNITY)
Admission: RE | Admit: 2016-10-08 | Discharge: 2016-10-08 | Disposition: A | Discharge status: Home / Self Care | Payer: Medicaid Other | Source: Ambulatory Visit | Attending: Internal Medicine | Admitting: Internal Medicine

## 2016-10-08 ENCOUNTER — Encounter (HOSPITAL_COMMUNITY): Payer: Self-pay | Admitting: Internal Medicine

## 2016-10-08 VITALS — BP 109/61 | HR 56 | Wt 241.8 lb

## 2016-10-08 DIAGNOSIS — I429 Cardiomyopathy, unspecified: Secondary | ICD-10-CM | POA: Insufficient documentation

## 2016-10-08 DIAGNOSIS — Z87891 Personal history of nicotine dependence: Secondary | ICD-10-CM | POA: Insufficient documentation

## 2016-10-08 DIAGNOSIS — Z79899 Other long term (current) drug therapy: Secondary | ICD-10-CM | POA: Insufficient documentation

## 2016-10-08 DIAGNOSIS — N183 Chronic kidney disease, stage 3 (moderate): Secondary | ICD-10-CM | POA: Insufficient documentation

## 2016-10-08 DIAGNOSIS — Z951 Presence of aortocoronary bypass graft: Secondary | ICD-10-CM | POA: Diagnosis not present

## 2016-10-08 DIAGNOSIS — I130 Hypertensive heart and chronic kidney disease with heart failure and stage 1 through stage 4 chronic kidney disease, or unspecified chronic kidney disease: Secondary | ICD-10-CM | POA: Insufficient documentation

## 2016-10-08 DIAGNOSIS — I5022 Chronic systolic (congestive) heart failure: Secondary | ICD-10-CM | POA: Insufficient documentation

## 2016-10-08 DIAGNOSIS — I2510 Atherosclerotic heart disease of native coronary artery without angina pectoris: Secondary | ICD-10-CM | POA: Insufficient documentation

## 2016-10-08 DIAGNOSIS — G4733 Obstructive sleep apnea (adult) (pediatric): Secondary | ICD-10-CM | POA: Insufficient documentation

## 2016-10-08 DIAGNOSIS — Z7982 Long term (current) use of aspirin: Secondary | ICD-10-CM | POA: Insufficient documentation

## 2016-10-08 LAB — BASIC METABOLIC PANEL
Anion gap: 7 (ref 5–15)
BUN: 18 mg/dL (ref 6–20)
CO2: 27 mmol/L (ref 22–32)
Calcium: 9.9 mg/dL (ref 8.9–10.3)
Chloride: 105 mmol/L (ref 101–111)
Creatinine, Ser: 1.38 mg/dL — ABNORMAL HIGH (ref 0.61–1.24)
GFR calc Af Amer: >60 mL/min (ref >60)
GFR calc non Af Amer: 57 mL/min — ABNORMAL LOW (ref >60)
Glucose, Bld: 88 mg/dL (ref 65–99)
Potassium: 4.2 mmol/L (ref 3.5–5.1)
Sodium: 139 mmol/L (ref 135–145)

## 2016-10-08 MED ORDER — SIMVASTATIN 20 MG PO TABS: 20.0000 mg | ORAL_TABLET | Freq: Every day | ORAL | 3 refills | Status: DC

## 2016-10-08 MED ORDER — CARVEDILOL 6.25 MG PO TABS: 6.2500 mg | ORAL_TABLET | Freq: Two times a day (BID) | ORAL | 3 refills | Status: DC

## 2016-10-08 MED ORDER — LOSARTAN POTASSIUM 25 MG PO TABS: 12.5000 mg | ORAL_TABLET | Freq: Every day | ORAL | 3 refills | Status: DC

## 2016-10-08 MED ORDER — CARVEDILOL 12.5 MG PO TABS: 6.2500 mg | ORAL_TABLET | Freq: Two times a day (BID) | ORAL | 3 refills | Status: DC

## 2016-10-08 NOTE — Addendum Note (Signed)
Encounter addended by: Scarlette Calico, RN on: 10/08/2016  1:27 PM<BR>    Actions taken: Order list changed

## 2016-10-08 NOTE — Addendum Note (Signed)
Encounter addended by: Scarlette Calico, RN on: 10/08/2016  1:24 PM<BR>    Actions taken: Medication long-term status modified, Order list changed, Diagnosis association updated, Sign clinical note

## 2016-10-08 NOTE — Patient Instructions (Signed)
Stop Imdur  Decrease Carvedilol to 6.25 mg (1/2 tab) Twice daily   Start Losartan 12.5 mg (1/2 tab) daily  Start Simvastatin 20 mg daily, every evening  Labs today  Your physician recommends that you schedule a follow-up appointment in: 3 months with echocardiogram

## 2016-10-08 NOTE — Progress Notes (Signed)
ADVANCED HF CLINIC NOTE  HPI  Bruce Robertson is a 55 y/o male with h/o HTN, nonischemic CM, OSA on CPAP referred by Dr. [name] for enrollment into the HF Clinic.   Has had a longstanding non-ischemic CM ejection fraction of 40%. He did end up requiring bypass because during his diagnostic catheterization he had dissection of his left main vessel. His ejection fraction eventually improved to about 45% with medications. The etiology was thought probably to be hypertensive.    Last echo 6/17 EF 25-30%.  Admitted at the end of September 2017 with HF decompensation. Diuresed and improved. Weight on d/c 224 pounds.   Here for f/u: Since we last saw him he had CPX. Stopped due to O2 sat down to 83%. Overall mild HF limitation. Feeling pretty good. Going to gym working out with weights and TM.  Weighing himself every day. Weight 234-236 at home. Now taking lasix 20 mg daily. Takes extra about 1x/week. Was struggling with low BP and lasix cut back to PRN and that when he got volume oveloaded. Out of carvedilol for 2-3 days  CPX 09/2016  FVC 3.49 (76%)   FEV1 2.62 (73%)     FEV1/FVC 75 (94%)     MVV 122 (76%)  Resting HR: 50 Peak HR: 103  (62% age predicted max HR) BP rest: 104/70 BP peak: 138/72 Peak VO2: 17.9 (63% predicted peak VO2) VE/VCO2 slope: 31 OUES: 2.12 Peak RER:   0.91 Ventilatory Threshold: 15.7 (56% predicted or measured peak VO2) Peak RR 35 VE/MVV: 46% O2pulse: 19  (100% predicted O2pulse)  No Known Allergies  Current Outpatient Prescriptions  Medication Sig Dispense Refill  . aspirin EC 81 MG EC tablet Take 1 tablet (81 mg total) by mouth daily. 30 tablet 3  . carvedilol (COREG) 12.5 MG tablet TAKE ONE TABLET BY MOUTH TWICE DAILY WITH MEALS 60 tablet 3  . furosemide (LASIX) 20 MG tablet Take 1 tablet (20 mg total) by mouth daily. 30 tablet 3  . isosorbide mononitrate (IMDUR) 30 MG 24 hr tablet Take 1 tablet (30 mg total) by mouth daily. 30 tablet 11  .  spironolactone (ALDACTONE) 25 MG tablet Take 0.5 tablets (12.5 mg total) by mouth daily. 90 tablet 3   No current facility-administered medications for this encounter.     Past Medical History:  Diagnosis Date  . Cardiomyopathy    Nonischemic. EF has been about 45%. S/P CABG.;  b.  Echo 4/14: EF 25%, global HK with inf and mid apical AK, restrictive physiology with E/e' > 15 (elevated LV filling pressure), trivial AI/MR, mild to mod LAE, mild RVE, mild reduced RVSF, mild RAE, mod TR, PASP 74 (severe pulmonary HTN)   . CHF (congestive heart failure) ([place])   . Colon polyps 2002  . Coronary artery disease   . Dyslipidemia   . History of echocardiogram    Echo 10/16:  EF 25-30%, poss non-compaction, diff HK with inf-lat and apical HK, restrictive physio, mild AI, severe LAE, mild RVE with mild reduced RVSF, PASP 42 mmHg  . Hyperplastic rectal polyp   . Reflux   . Sleep apnea    Mild  . Tobacco abuse    Remote  . Ulcer ([place])    In GI tract    Past Surgical History:  Procedure Laterality Date  . CARDIAC CATHETERIZATION  2007 and 2009    x 2  . CORONARY ARTERY BYPASS GRAFT  2007   Had a left main dissection after catheterization. Underwent a  saphenous vein graft to the LAD and a saphenous vein graft to obtuse marginal.  . LEG TENDON SURGERY  2005   Right patellar    ROS:   As stated in the HPI and negative for all other systems.  PHYSICAL EXAM Pulse (!) 56   Wt 241 lb 12.8 oz (109.7 kg)   SpO2 95%   BMI 31.90 kg/m    Vitals:   10/08/16 1206  BP: 109/61  Pulse: (!) 56  SpO2: 95%  Weight: 241 lb 12.8 oz (109.7 kg)    GENERAL:  Well appearing NECK:  No jugular venous distention, waveform within normal limits, carotid upstroke brisk and symmetric, no bruits, no thyromegaly LUNGS:  Clear to auscultation bilaterally CHEST: Well healed sternotomy scar. HEART:  PMI not displaced or sustained,S1 and S2 within normal limits, no S3, no S4, no clicks, no rubs, no murmurs ABD:   Flat, positive bowel sounds normal in frequency in pitch, no bruits, no rebound, no guarding, no midline pulsatile mass, no hepatomegaly, no splenomegaly EXT:  2 plus pulses throughout, no edema, no cyanosis no clubbing   ASSESSMENT AND PLAN:  1. Chronic systolic HF due to NICM. EF 25-30% echo 6/17 --NYHA I-II. Volume status look good.  --CPX showed exertional desaturation but we walked clinic today briskly and sats 98-99% so doubt this was accurate --Resume carvedilol at lower dose of 6.25 bid to make room for losartan --Previously intolerant to ACE/ARB due to low BP. Will rechallenge with losartan 12.5 qhs. Can decrease carvedilol if needed so he can tolerate. --Continue spiro 12.5 daily --Stop Imdur --BMET today --Would hold off on ICD until we see response to better meds and CPAP. Has echo scheduled for next month.  2. CAD  --Normal coronaries. S/p LM dissection during cath and now with CABG. Continue ASA 81. Will start low-dose simva 20   3. OSA on CPAP   --continue CPAP  4. CKD, stage III --baseline creatinine 1.4-1.5   Bensimhon, Daniel,MD 12:50 PM

## 2016-10-15 ENCOUNTER — Ambulatory Visit (HOSPITAL_COMMUNITY): Payer: Medicaid Other | Attending: Cardiology

## 2016-10-15 ENCOUNTER — Other Ambulatory Visit: Payer: Self-pay

## 2016-10-15 DIAGNOSIS — I429 Cardiomyopathy, unspecified: Secondary | ICD-10-CM | POA: Insufficient documentation

## 2016-10-20 NOTE — Progress Notes (Signed)
HPI The patient presents for followup of his cardiomyopathy. Years ago he had an ejection fraction of 40%. 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 He did end up requiring bypass because during his diagnostic catheterization he had dissection of his left main vessel. His ejection fraction eventually improved to about 45% with medications. The etiology was thought probably to be hypertensive.  Subsequent echocardiogram demonstrates his EF now to be about 25%.  I repeated an echo recently and the EF was still 20 - 25%.  He has been followed in the HF clinic. Meds been titrated slightly but his blood pressure precludes significant movement. I think he is more compliant with medications. He's compliant with CPAP now being treated for sleep apnea and he feels much better with more energy. He's doing some exercising and working part-time. He has no excessive dyspnea, PND or orthopnea. He has no palpitations, presyncope or syncope. He's not having any chest pain. He did have his cardiopulmonary stress test and I reviewed these results.  No Known Allergies  Current Outpatient Prescriptions  Medication Sig Dispense Refill  . aspirin EC 81 MG EC tablet Take 1 tablet (81 mg total) by mouth daily. 30 tablet 3  . carvedilol (COREG) 6.25 MG tablet Take 1 tablet (6.25 mg total) by mouth 2 (two) times daily with a meal. 60 tablet 3  . furosemide (LASIX) 20 MG tablet Take 1 tablet (20 mg total) by mouth daily. 30 tablet 3  . losartan (COZAAR) 25 MG tablet Take 0.5 tablets (12.5 mg total) by mouth daily. 15 tablet 3  . simvastatin (ZOCOR) 20 MG tablet Take 1 tablet (20 mg total) by mouth at bedtime. 30 tablet 3  . spironolactone (ALDACTONE) 25 MG tablet Take 0.5 tablets (12.5 mg total) by mouth daily. 90 tablet 3   No current facility-administered medications for this visit.     Past Medical History:  Diagnosis Date  . Cardiomyopathy    Nonischemic. EF has been about 45%. S/P CABG.;  b.  Echo 4/14: EF 25%, global HK  with inf and mid apical AK, restrictive physiology with E/e' > 15 (elevated LV filling pressure), trivial AI/MR, mild to mod LAE, mild RVE, mild reduced RVSF, mild RAE, mod TR, PASP 74 (severe pulmonary HTN)   . CHF (congestive heart failure) ([place])   . Colon polyps 2002  . Coronary artery disease   . Dyslipidemia   . History of echocardiogram    Echo 10/16:  EF 25-30%, poss non-compaction, diff HK with inf-lat and apical HK, restrictive physio, mild AI, severe LAE, mild RVE with mild reduced RVSF, PASP 42 mmHg  . Hyperplastic rectal polyp   . Reflux   . Sleep apnea    Mild  . Tobacco abuse    Remote  . Ulcer ([place])    In GI tract    Past Surgical History:  Procedure Laterality Date  . CARDIAC CATHETERIZATION  2007 and 2009    x 2  . CORONARY ARTERY BYPASS GRAFT  2007   Had a left main dissection after catheterization. Underwent a saphenous vein graft to the LAD and a saphenous vein graft to obtuse marginal.  . LEG TENDON SURGERY  2005   Right patellar    ROS:   As stated in the HPI and negative for all other systems.  PHYSICAL EXAM BP 104/62   Pulse (!) 54   Ht 6\' 1"  (1.854 m)   Wt 244 lb (110.7 kg)   BMI 32.19 kg/m  GENERAL:  Well appearing NECK:  No jugular venous distention, waveform within normal limits, carotid upstroke brisk and symmetric, no bruits, no thyromegaly LUNGS:  Clear to auscultation bilaterally CHEST: Well healed sternotomy scar. HEART:  PMI not displaced or sustained,S1 and S2 within normal limits, no S3, no S4, no clicks, no rubs, no murmurs ABD:  Flat, positive bowel sounds normal in frequency in pitch, no bruits, no rebound, no guarding, no midline pulsatile mass, no hepatomegaly, no splenomegaly EXT:  2 plus pulses throughout, no edema, no cyanosis no clubbing   ASSESSMENT AND PLAN:  CARDIOMYOPATHY:  His ejection fraction is reduced and is not much room to titrate his meds. He's being considered for an ICD and I'll send a message to Dr. Haroldine Laws  as I think he should be referred for this.  He will continue the meds as listed.  SLEEP APNEA:   This is much improved as above.   HYPOTENSION:  This is low as above.

## 2016-10-24 ENCOUNTER — Encounter: Payer: Self-pay | Admitting: Cardiology

## 2016-10-24 ENCOUNTER — Ambulatory Visit (INDEPENDENT_AMBULATORY_CARE_PROVIDER_SITE_OTHER): Payer: Medicaid Other | Admitting: Cardiology

## 2016-10-24 VITALS — BP 104/62 | HR 54 | Ht 73.0 in | Wt 244.0 lb

## 2016-10-24 DIAGNOSIS — G4733 Obstructive sleep apnea (adult) (pediatric): Secondary | ICD-10-CM

## 2016-10-24 DIAGNOSIS — I428 Other cardiomyopathies: Secondary | ICD-10-CM | POA: Diagnosis not present

## 2016-10-24 DIAGNOSIS — Z951 Presence of aortocoronary bypass graft: Secondary | ICD-10-CM

## 2016-10-24 DIAGNOSIS — I5022 Chronic systolic (congestive) heart failure: Secondary | ICD-10-CM

## 2016-10-24 NOTE — Patient Instructions (Signed)
Medication Instructions:  Continue current medications  Labwork: None Ordered  Testing/Procedures: None Ordered  Follow-Up: Your physician recommends that you schedule a follow-up appointment in: 3 Months.   Any Other Special Instructions Will Be Listed Below (If Applicable).         Happy Holiday  If you need a refill on your cardiac medications before your next appointment, please call your pharmacy.

## 2016-11-11 ENCOUNTER — Other Ambulatory Visit: Payer: Self-pay | Admitting: Internal Medicine

## 2016-11-25 ENCOUNTER — Other Ambulatory Visit: Payer: Self-pay | Admitting: Internal Medicine

## 2016-11-26 ENCOUNTER — Telehealth (HOSPITAL_COMMUNITY): Payer: Self-pay | Admitting: Pharmacist

## 2016-11-26 NOTE — Telephone Encounter (Signed)
Losartan 12.5 mg daily PA approved by [place] [employer] through 11/06/17.   Ruta Hinds. Velva Harman, PharmD, BCPS, CPP Clinical Pharmacist Pager: [phone] Phone: [phone] 11/26/2016 10:44 AM

## 2016-11-30 ENCOUNTER — Other Ambulatory Visit: Payer: Self-pay | Admitting: Internal Medicine

## 2016-12-02 ENCOUNTER — Other Ambulatory Visit (HOSPITAL_COMMUNITY): Payer: Self-pay | Admitting: *Deleted

## 2016-12-02 MED ORDER — FUROSEMIDE 20 MG PO TABS
20.0000 mg | ORAL_TABLET | Freq: Every day | ORAL | 3 refills | Status: DC
Start: 2016-12-02 — End: 2017-01-23

## 2016-12-31 DIAGNOSIS — R454 Irritability and anger: Secondary | ICD-10-CM | POA: Insufficient documentation

## 2017-01-15 ENCOUNTER — Encounter: Payer: Self-pay | Admitting: Cardiology

## 2017-01-22 NOTE — Progress Notes (Signed)
HPI The patient presents for followup of his cardiomyopathy. Years ago he had an ejection fraction of 40%. 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 He did end up requiring bypass because during his diagnostic catheterization he had dissection of his left main vessel. His ejection fraction eventually improved to about 45% with medications. The etiology was thought probably to be hypertensive.  Subsequent echocardiogram demonstrates his EF now to be about 25%.  I repeated an echo recently and the EF was still 20 - 25%.  He has been followed in the HF clinic. Meds have been titrated slightly but his blood pressure precludes significant movement.  At the last visit I talked to him about getting an ICD.    He returns for follow-up. He says he does have some swelling some days but I don't think these necessarily completely compliant with his diet.  He is out of his Lasix just today. He's not having any new PND or orthopnea. He might have occasional leg swelling. He has no chest pressure, neck or arm discomfort. His weights have been relatively steady. He has no palpitations, presyncope or syncope.   No Known Allergies  Current Outpatient Prescriptions  Medication Sig Dispense Refill  . aspirin EC 81 MG EC tablet Take 1 tablet (81 mg total) by mouth daily. 30 tablet 3  . carvedilol (COREG) 6.25 MG tablet Take 1 tablet (6.25 mg total) by mouth 2 (two) times daily with a meal. 60 tablet 3  . DULoxetine (CYMBALTA) 30 MG capsule Take 1 capsule by mouth daily.    . furosemide (LASIX) 40 MG tablet Take 1 tablet (40 mg total) by mouth daily. 30 tablet 11  . losartan (COZAAR) 25 MG tablet Take 0.5 tablets (12.5 mg total) by mouth daily. 15 tablet 3  . simvastatin (ZOCOR) 20 MG tablet Take 1 tablet (20 mg total) by mouth at bedtime. 30 tablet 3  . spironolactone (ALDACTONE) 25 MG tablet Take 0.5 tablets (12.5 mg total) by mouth daily. 90 tablet 3   No current facility-administered medications for this visit.      Past Medical History:  Diagnosis Date  . Cardiomyopathy    Nonischemic. EF has been about 45%. S/P CABG.;  b.  Echo 4/14: EF 25%, global HK with inf and mid apical AK, restrictive physiology with E/e' > 15 (elevated LV filling pressure), trivial AI/MR, mild to mod LAE, mild RVE, mild reduced RVSF, mild RAE, mod TR, PASP 74 (severe pulmonary HTN)   . CHF (congestive heart failure) ([place])   . Colon polyps 2002  . Coronary artery disease   . Dyslipidemia   . History of echocardiogram    Echo 10/16:  EF 25-30%, poss non-compaction, diff HK with inf-lat and apical HK, restrictive physio, mild AI, severe LAE, mild RVE with mild reduced RVSF, PASP 42 mmHg  . Hyperplastic rectal polyp   . Reflux   . Sleep apnea    Mild  . Tobacco abuse    Remote  . Ulcer ([place])    In GI tract    Past Surgical History:  Procedure Laterality Date  . CARDIAC CATHETERIZATION  2007 and 2009    x 2  . CORONARY ARTERY BYPASS GRAFT  2007   Had a left main dissection after catheterization. Underwent a saphenous vein graft to the LAD and a saphenous vein graft to obtuse marginal.  . LEG TENDON SURGERY  2005   Right patellar    ROS:     As stated in the HPI  and negative for all other systems.  PHYSICAL EXAM BP 102/62   Pulse (!) 53   Ht 6\' 1"  (1.854 m)   Wt 242 lb 12.8 oz (110.1 kg)   BMI 32.03 kg/m  GENERAL:  Well appearing NECK:  No jugular venous distention, waveform within normal limits, carotid upstroke brisk and symmetric, no bruits, no thyromegaly LUNGS:  Clear to auscultation bilaterally CHEST: Well healed sternotomy scar. HEART:  PMI not displaced or sustained,S1 and S2 within normal limits, no S3, no S4, no clicks, no rubs, no murmurs ABD:  Flat, positive bowel sounds normal in frequency in pitch, no bruits, no rebound, no guarding, no midline pulsatile mass, no hepatomegaly, no splenomegaly EXT:  2 plus pulses throughout, no edema, no cyanosis no clubbing   ASSESSMENT AND  PLAN:  CARDIOMYOPATHY:   I cannot titrate his meds as his blood pressure heart rate will not allow. I'm going to refer him for evaluation for ICD.  SLEEP APNEA:   This is much improved.  He will continue with his CPAP.  HYPOTENSION:  This is low as above.    This precludes med titration.

## 2017-01-23 ENCOUNTER — Encounter: Payer: Self-pay | Admitting: Cardiology

## 2017-01-23 ENCOUNTER — Ambulatory Visit (INDEPENDENT_AMBULATORY_CARE_PROVIDER_SITE_OTHER): Payer: Medicaid Other | Admitting: Cardiology

## 2017-01-23 VITALS — BP 102/62 | HR 53 | Ht 73.0 in | Wt 242.8 lb

## 2017-01-23 DIAGNOSIS — I10 Essential (primary) hypertension: Secondary | ICD-10-CM

## 2017-01-23 DIAGNOSIS — I420 Dilated cardiomyopathy: Secondary | ICD-10-CM

## 2017-01-23 LAB — BASIC METABOLIC PANEL
BUN: 16 mg/dL (ref 7–25)
CO2: 31 mmol/L (ref 20–31)
Calcium: 9.7 mg/dL (ref 8.6–10.3)
Chloride: 104 mmol/L (ref 98–110)
Creat: 1.45 mg/dL — ABNORMAL HIGH (ref 0.70–1.33)
Glucose, Bld: 95 mg/dL (ref 65–99)
Potassium: 4.9 mmol/L (ref 3.5–5.3)
Sodium: 141 mmol/L (ref 135–146)

## 2017-01-23 MED ORDER — FUROSEMIDE 40 MG PO TABS
40.0000 mg | ORAL_TABLET | Freq: Every day | ORAL | 11 refills | Status: DC
Start: 2017-01-23 — End: 2017-06-24

## 2017-01-23 NOTE — Patient Instructions (Addendum)
Medication Instructions:  Continue current medications  Labwork: BMP   Testing/Procedures: None Ordered  Follow-Up: You have been referred to Cardiac Electrophysiology   Any Other Special Instructions Will Be Listed Below (If Applicable).   If you need a refill on your cardiac medications before your next appointment, please call your pharmacy.

## 2017-02-06 ENCOUNTER — Other Ambulatory Visit (HOSPITAL_COMMUNITY): Payer: Self-pay | Admitting: Internal Medicine

## 2017-02-11 ENCOUNTER — Other Ambulatory Visit: Payer: Self-pay | Admitting: Cardiology

## 2017-02-11 ENCOUNTER — Ambulatory Visit (INDEPENDENT_AMBULATORY_CARE_PROVIDER_SITE_OTHER): Payer: Medicaid Other | Admitting: Cardiology

## 2017-02-11 ENCOUNTER — Encounter: Payer: Self-pay | Admitting: Cardiology

## 2017-02-11 VITALS — BP 100/70 | HR 48 | Ht 73.0 in | Wt 241.0 lb

## 2017-02-11 DIAGNOSIS — I428 Other cardiomyopathies: Secondary | ICD-10-CM | POA: Diagnosis not present

## 2017-02-11 DIAGNOSIS — I5022 Chronic systolic (congestive) heart failure: Secondary | ICD-10-CM | POA: Diagnosis not present

## 2017-02-11 NOTE — Progress Notes (Signed)
Electrophysiology Office Note   Date:  02/11/2017   ID:  Bruce Robertson, DOB 19-Apr-1961, MRN [id number]  PCP:  Darden Amber, PA  Cardiologist:  [place] Primary Electrophysiologist:  Bernice Mullin [name], MD    Chief Complaint  Patient presents with  . Advice Only    Non-ischemic cardiomyopathy/Chronic systolic CHF     History of Present Illness: Bruce Robertson is a 56 y.o. male who is being seen today for the evaluation of nonischemic cardiomyopathy at the request of [name], MD. Presenting today for electrophysiology evaluation. Repeat echo showed a stable ejection fraction of 25%. His medications have been titrated, but blood pressure precludes significant change. Today he is feeling well without major complaint. He says he has his good days and bad days. He does have episodic shortness of breath.    Today, he denies symptoms of palpitations, chest pain, shortness of breath, orthopnea, PND, lower extremity edema, claudication, dizziness, presyncope, syncope, bleeding, or neurologic sequela. The patient is tolerating medications without difficulties.    Past Medical History:  Diagnosis Date  . Cardiomyopathy    Nonischemic. EF has been about 45%. S/P CABG.;  b.  Echo 4/14: EF 25%, global HK with inf and mid apical AK, restrictive physiology with E/e' > 15 (elevated LV filling pressure), trivial AI/MR, mild to mod LAE, mild RVE, mild reduced RVSF, mild RAE, mod TR, PASP 74 (severe pulmonary HTN)   . CHF (congestive heart failure) ([place])   . Colon polyps 2002  . Coronary artery disease   . Dyslipidemia   . History of echocardiogram    Echo 10/16:  EF 25-30%, poss non-compaction, diff HK with inf-lat and apical HK, restrictive physio, mild AI, severe LAE, mild RVE with mild reduced RVSF, PASP 42 mmHg  . Hyperplastic rectal polyp   . Reflux   . Sleep apnea    Mild  . Tobacco abuse    Remote  . Ulcer ([place])    In GI tract   Past Surgical History:  Procedure  Laterality Date  . CARDIAC CATHETERIZATION  2007 and 2009    x 2  . CORONARY ARTERY BYPASS GRAFT  2007   Had a left main dissection after catheterization. Underwent a saphenous vein graft to the LAD and a saphenous vein graft to obtuse marginal.  . LEG TENDON SURGERY  2005   Right patellar     Current Outpatient Prescriptions  Medication Sig Dispense Refill  . aspirin EC 81 MG EC tablet Take 1 tablet (81 mg total) by mouth daily. 30 tablet 3  . carvedilol (COREG) 6.25 MG tablet Take 1 tablet (6.25 mg total) by mouth 2 (two) times daily with a meal. 60 tablet 3  . DULoxetine (CYMBALTA) 30 MG capsule Take 1 capsule by mouth daily.    . furosemide (LASIX) 40 MG tablet Take 1 tablet (40 mg total) by mouth daily. 30 tablet 11  . losartan (COZAAR) 25 MG tablet TAKE ONE-HALF TABLET BY MOUTH ONCE DAILY 15 tablet 11  . spironolactone (ALDACTONE) 25 MG tablet Take 0.5 tablets (12.5 mg total) by mouth daily. 90 tablet 3  . simvastatin (ZOCOR) 20 MG tablet Take 1 tablet (20 mg total) by mouth at bedtime. 30 tablet 3   No current facility-administered medications for this visit.     Allergies:   Patient has no known allergies.   Social History:  The patient  reports that he quit smoking about 12 years ago. He has never used smokeless tobacco. He reports that he  drinks alcohol. He reports that he does not use drugs.   Family History:  The patient's family history includes Cancer in his other; Heart failure in his father; Hypertension in his mother; Lung cancer in his other; Osteoarthritis in his mother.    ROS:  Please see the history of present illness.   Otherwise, review of systems is positive for Cough, shortness of breath, bloody stools, depression, anxiety.   All other systems are reviewed and negative.    PHYSICAL EXAM: VS:  BP 100/70   Pulse (!) 48   Ht 6\' 1"  (1.854 m)   Wt 241 lb (109.3 kg)   BMI 31.80 kg/m  , BMI Body mass index is 31.8 kg/m. GEN: Well nourished, well developed,  in no acute distress  HEENT: normal  Neck: no JVD, carotid bruits, or masses Cardiac: RRR; no murmurs, rubs, or gallops,no edema  Respiratory:  clear to auscultation bilaterally, normal work of breathing GI: soft, nontender, nondistended, + BS MS: no deformity or atrophy  Skin: warm and dry Neuro:  Strength and sensation are intact Psych: euthymic mood, full affect  EKG:  EKG is ordered today. Personal review of the ekg ordered shows sinus rhythm, rate 48, right axis deviation  Recent Labs: 07/31/2016: B Natriuretic Peptide 533.9; Hemoglobin 13.4; Platelets 140 08/01/2016: ALT 25 01/23/2017: BUN 16; Creat 1.45; Potassium 4.9; Sodium 141    Lipid Panel     Component Value Date/Time   CHOL 200 08/01/2016 0453   TRIG 75 08/01/2016 0453   HDL 49 08/01/2016 0453   CHOLHDL 4.1 08/01/2016 0453   VLDL 15 08/01/2016 0453   LDLCALC 136 (H) 08/01/2016 0453   LDLDIRECT 166.4 05/17/2010 0905     Wt Readings from Last 3 Encounters:  02/11/17 241 lb (109.3 kg)  01/23/17 242 lb 12.8 oz (110.1 kg)  10/24/16 244 lb (110.7 kg)      Other studies Reviewed: Additional studies/ records that were reviewed today include: TTE 10/15/16  Review of the above records today demonstrates:  - Left ventricle: The cavity size was severely dilated. Wall   thickness was increased in a pattern of mild LVH. Systolic   function was severely reduced. The estimated ejection fraction   was in the range of 20% to 25%. Diffuse hypokinesis. There is   akinesis of the inferolateral and inferior myocardium. Doppler   parameters are consistent with restrictive physiology, indicative   of decreased left ventricular diastolic compliance and/or   increased left atrial pressure. - Aortic valve: There was trivial regurgitation. - Mitral valve: There was mild regurgitation. - Left atrium: The atrium was moderately to severely dilated. - Right atrium: The atrium was mildly dilated. - Pulmonary arteries: Systolic  pressure was moderately increased.   PA peak pressure: 46 mm Hg (S).    ASSESSMENT AND PLAN:  1.  Nonischemic cardiomyopathy: Currently on optimal medical therapy with carvedilol, losartan, and Aldactone. His ejection fraction has consistently been low. I discussed with him the option of ICD placement. Risks and benefits were discussed. Risks include bleeding, infection, tamponade, and pneumothorax. He understands these risks and has agreed to the procedure.  2. Sleep apnea: compliant with CPAP   Current medicines are reviewed at length with the patient today.   The patient does not have concerns regarding his medicines.  The following changes were made today:  none  Labs/ tests ordered today include:  No orders of the defined types were placed in this encounter.    Disposition:   FU  with Aleksis Jiggetts 3 months  Signed, Allyssa Abruzzese [name], MD  02/11/2017 11:42 AM     [facility] [address] [place] [address] [phone] (office) [phone] (fax)

## 2017-02-11 NOTE — Patient Instructions (Signed)
Medication Instructions:    Your physician recommends that you continue on your current medications as directed. Please refer to the Current Medication list given to you today.  --- If you need a refill on your cardiac medications before your next appointment, please call your pharmacy. ---  Labwork:  None ordered today.  Testing/Procedures: Your physician has recommended that you have a defibrillator inserted. An implantable cardioverter defibrillator (ICD) is a small device that is placed in your chest or, in rare cases, your abdomen. This device uses electrical pulses or shocks to help control life-threatening, irregular heartbeats that could lead the heart to suddenly stop beating (sudden cardiac arrest). Leads are attached to the ICD that goes into your heart. This is done in the hospital and usually requires an overnight stay. Pl  Please call Rakeem Colley, RN when you are ready to schedule the procedure.  The following dates are available (these are subject to change):    5/17, 5/22, 6/7, 6/15  Follow-Up:  To be determined once procedure is scheduled.  Thank you for choosing [facility]!!   Trinidad Curet, RN [phone]  Any Other Special Instructions Will Be Listed Below (If Applicable).   Cardioverter Defibrillator Implantation An implantable cardioverter defibrillator (ICD) is a small, lightweight, battery-powered device that is placed (implanted) under the skin in the chest or abdomen. Your caregiver may prescribe an ICD if:  You have had an irregular heart rhythm (arrhythmia) that originated in the lower chambers of the heart (ventricles).  Your heart has been damaged by a disease (such as coronary artery disease) or heart condition (such as a heart attack). An ICD consists of a battery that lasts several years, a small computer called a pulse generator, and wires called leads that go into the heart. It is used to detect and correct two dangerous arrhythmias: a rapid  heart rhythm (tachycardia) and an arrhythmia in which the ventricles contract in an uncoordinated way (fibrillation). When an ICD detects tachycardia, it sends an electrical signal to the heart that restores the heartbeat to normal (cardioversion). This signal is usually painless. If cardioversion does not work or if the ICD detects fibrillation, it delivers a small electrical shock to the heart (defibrillation) to restart the heart. The shock may feel like a strong jolt in the chest.ICDs may be programmed to correct other problems. Sometimes, ICDs are programmed to act as another type of implantable device called a pacemaker. Pacemakers are used to treat a slow heartbeat (bradycardia). LET YOUR CAREGIVER KNOW ABOUT:  Any allergies you have.  All medicines you are taking, including vitamins, herbs, eyedrops, and over-the-counter medicines and creams.  Previous problems you or members of your family have had with the use of anesthetics.  Any blood disorders you have had.  Other health problems you have. RISKS AND COMPLICATIONS Generally, the procedure to implant an ICD is safe. However, as with any surgical procedure, complications can occur. Possible complications associated with implanting an ICD include:  Swelling, bleeding, or bruising at the site where the ICD was implanted.  Infection at the site where the ICD was implanted.  A reaction to medicine used during the procedure.  Nerve, heart, or blood vessel damage.  Blood clots. BEFORE THE PROCEDURE  You may need to have blood tests, heart tests, or a chest X-ray done before the day of the procedure.  Ask your caregiver about changing or stopping your regular medicines.  Make plans to have someone drive you home. You may need to stay in  the hospital overnight after the procedure.  Stop smoking at least 24 hours before the procedure.  Take a bath or shower the night before the procedure. You may need to scrub your chest or  abdomen with a special type of soap.  Do not eat or drink before your procedure for as long as directed by your caregiver. Ask if it is okay to take any needed medicine with a small sip of water. PROCEDURE  The procedure to implant an ICD in your chest or abdomen is usually done at a hospital in a room that has a large X-ray machine called a fluoroscope. The machine will be above you during the procedure. It will help your caregiver see your heart during the procedure. Implanting an ICD usually takes 1-3 hours. Before the procedure:   Small monitors will be put on your body. They will be used to check your heart, blood pressure, and oxygen level.  A needle will be put into a vein in your hand or arm. This is called an intravenous (IV) access tube. Fluids and medicine will flow directly into your body through the IV tube.  Your chest or abdomen will be cleaned with a germ-killing (antiseptic) solution. The area may be shaved.  You may be given medicine to help you relax (sedative).  You will be given a medicine called a local anesthetic. This medicine will make the surgical site numb while the ICD is implanted. You will be sleepy but awake during the procedure. After you are numb the procedure will begin. The caregiver will:  Make a small cut (incision). This will make a pocket deep under your skin that will hold the pulse generator.  Guide the leads through a large blood vessel into your heart and attach them to the heart muscles. Depending on the ICD, the leads may go into one ventricle or they may go to both ventricles and into an upper chamber of the heart (atrium).  Test the ICD.  Close the incision with stitches, glue, or staples. AFTER THE PROCEDURE  You may feel pain. Some pain is normal. It may last a few days.  You may stay in a recovery area until the local anesthetic has worn off. Your blood pressure and pulse will be checked often. You will be taken to a room where your heart  will be monitored.  A chest X-ray will be taken. This is done to check that the cardioverter defibrillator is in the right place.  You may stay in the hospital overnight.  A slight bump may be seen over the skin where the ICD was placed. Sometimes, it is possible to feel the ICD under the skin. This is normal.  In the months and years afterward, your caregiver will check the device, the leads, and the battery every few months. Eventually, when the battery is low, the ICD will be replaced.   This information is not intended to replace advice given to you by your health care provider. Make sure you discuss any questions you have with your health care provider.   Document Released: 07/13/2002 Document Revised: 08/11/2013 Document Reviewed: 11/09/2012 Elsevier Interactive Patient Education [employer].

## 2017-02-13 ENCOUNTER — Telehealth: Payer: Self-pay | Admitting: Cardiology

## 2017-02-13 NOTE — Telephone Encounter (Signed)
New Message     Pt was calling to schedule having defibulator surgery

## 2017-02-14 ENCOUNTER — Encounter: Payer: Self-pay | Admitting: Cardiology

## 2017-02-14 NOTE — Telephone Encounter (Signed)
New message       Calling to let the nurse know that April 17th is a good day to have the defibrillator procedure.

## 2017-02-14 NOTE — Telephone Encounter (Signed)
Bruce Robertson Bruce Robertson at 02/14/2017 10:13 AM   Status: Signed    New message       Calling to let the nurse know that April 17th is a good day to have the defibrillator procedure.

## 2017-02-14 NOTE — Telephone Encounter (Signed)
Pt would like to go May 17th (not April 17). Informed that I would arrange and call him next week to review.  Pt is agreeable.

## 2017-02-14 NOTE — Telephone Encounter (Signed)
This encounter was created in error - please disregard.

## 2017-03-06 NOTE — Telephone Encounter (Signed)
lm

## 2017-03-10 ENCOUNTER — Encounter: Payer: Self-pay | Admitting: *Deleted

## 2017-03-10 NOTE — Telephone Encounter (Signed)
ICD scheduled for 5/17. Pre procedure lab to be done in the hospital morning of procedure. Post implant wound check scheduled. Letter of instructions for procedure and surgical scrub left at front desk for him to pick up tomorrow. Patient verbalized understanding and agreeable to plan.

## 2017-03-20 ENCOUNTER — Ambulatory Visit (HOSPITAL_COMMUNITY)
Admission: RE | Admit: 2017-03-20 | Discharge: 2017-03-21 | Disposition: A | Discharge status: Home / Self Care | Payer: Medicaid Other | Source: Ambulatory Visit | Attending: Cardiology | Admitting: Cardiology

## 2017-03-20 ENCOUNTER — Encounter (HOSPITAL_COMMUNITY): Payer: Self-pay | Admitting: General Practice

## 2017-03-20 ENCOUNTER — Encounter (HOSPITAL_COMMUNITY)
Admission: RE | Disposition: A | Discharge status: Home / Self Care | Payer: Self-pay | Source: Ambulatory Visit | Attending: Cardiology

## 2017-03-20 DIAGNOSIS — Z7982 Long term (current) use of aspirin: Secondary | ICD-10-CM | POA: Insufficient documentation

## 2017-03-20 DIAGNOSIS — Z95818 Presence of other cardiac implants and grafts: Secondary | ICD-10-CM

## 2017-03-20 DIAGNOSIS — Z951 Presence of aortocoronary bypass graft: Secondary | ICD-10-CM | POA: Diagnosis not present

## 2017-03-20 DIAGNOSIS — I509 Heart failure, unspecified: Secondary | ICD-10-CM | POA: Diagnosis present

## 2017-03-20 DIAGNOSIS — I2510 Atherosclerotic heart disease of native coronary artery without angina pectoris: Secondary | ICD-10-CM | POA: Diagnosis not present

## 2017-03-20 DIAGNOSIS — Z8249 Family history of ischemic heart disease and other diseases of the circulatory system: Secondary | ICD-10-CM | POA: Insufficient documentation

## 2017-03-20 DIAGNOSIS — Z87891 Personal history of nicotine dependence: Secondary | ICD-10-CM | POA: Insufficient documentation

## 2017-03-20 DIAGNOSIS — I428 Other cardiomyopathies: Secondary | ICD-10-CM | POA: Insufficient documentation

## 2017-03-20 DIAGNOSIS — K219 Gastro-esophageal reflux disease without esophagitis: Secondary | ICD-10-CM | POA: Insufficient documentation

## 2017-03-20 DIAGNOSIS — E785 Hyperlipidemia, unspecified: Secondary | ICD-10-CM | POA: Diagnosis not present

## 2017-03-20 DIAGNOSIS — I429 Cardiomyopathy, unspecified: Secondary | ICD-10-CM

## 2017-03-20 DIAGNOSIS — G4730 Sleep apnea, unspecified: Secondary | ICD-10-CM | POA: Insufficient documentation

## 2017-03-20 HISTORY — DX: Presence of automatic (implantable) cardiac defibrillator: Z95.810

## 2017-03-20 HISTORY — PX: CARDIAC DEFIBRILLATOR PLACEMENT: SHX171

## 2017-03-20 HISTORY — DX: Personal history of peptic ulcer disease: Z87.11

## 2017-03-20 HISTORY — DX: Anxiety disorder, unspecified: F41.9

## 2017-03-20 HISTORY — DX: Obstructive sleep apnea (adult) (pediatric): Z99.89

## 2017-03-20 HISTORY — DX: Major depressive disorder, single episode, unspecified: F32.9

## 2017-03-20 HISTORY — PX: ICD IMPLANT: EP1208

## 2017-03-20 HISTORY — DX: Obstructive sleep apnea (adult) (pediatric): G47.33

## 2017-03-20 HISTORY — DX: Gastro-esophageal reflux disease without esophagitis: K21.9

## 2017-03-20 LAB — BASIC METABOLIC PANEL
Anion gap: 10 (ref 5–15)
BUN: 17 mg/dL (ref 6–20)
CO2: 23 mmol/L (ref 22–32)
Calcium: 8.8 mg/dL — ABNORMAL LOW (ref 8.9–10.3)
Chloride: 104 mmol/L (ref 101–111)
Creatinine, Ser: 1.29 mg/dL — ABNORMAL HIGH (ref 0.61–1.24)
GFR calc Af Amer: >60 mL/min (ref >60)
GFR calc non Af Amer: >60 mL/min (ref >60)
Glucose, Bld: 107 mg/dL — ABNORMAL HIGH (ref 65–99)
Potassium: 3.8 mmol/L (ref 3.5–5.1)
Sodium: 137 mmol/L (ref 135–145)

## 2017-03-20 LAB — CBC
HCT: 42 % (ref 39.0–52.0)
Hemoglobin: 13.8 g/dL (ref 13.0–17.0)
MCH: 28.6 pg (ref 26.0–34.0)
MCHC: 32.9 g/dL (ref 30.0–36.0)
MCV: 87.1 fL (ref 78.0–100.0)
Platelets: 150 10*3/uL (ref 150–400)
RBC: 4.82 MIL/uL (ref 4.22–5.81)
RDW: 14.4 % (ref 11.5–15.5)
WBC: 4.8 10*3/uL (ref 4.0–10.5)

## 2017-03-20 LAB — SURGICAL PCR SCREEN
MRSA, PCR: NEGATIVE
Staphylococcus aureus: NEGATIVE

## 2017-03-20 SURGERY — ICD IMPLANT
Anesthesia: LOCAL

## 2017-03-20 MED ORDER — SPIRONOLACTONE 25 MG PO TABS
12.5000 mg | ORAL_TABLET | Freq: Every day | ORAL | Status: DC
  Administered 2017-03-20: 12.5 mg via ORAL
  Filled 2017-03-20 (×2): qty 1

## 2017-03-20 MED ORDER — MIDAZOLAM HCL 5 MG/5ML IJ SOLN
INTRAMUSCULAR | Status: DC | PRN
  Administered 2017-03-20: 2 mg via INTRAVENOUS
  Administered 2017-03-20: 1 mg via INTRAVENOUS

## 2017-03-20 MED ORDER — CEFAZOLIN SODIUM-DEXTROSE 2-4 GM/100ML-% IV SOLN
INTRAVENOUS | Status: AC
  Filled 2017-03-20: qty 100

## 2017-03-20 MED ORDER — LOSARTAN POTASSIUM 25 MG PO TABS
12.5000 mg | ORAL_TABLET | Freq: Every day | ORAL | Status: DC
Start: 2017-03-20 — End: 2017-03-21
  Administered 2017-03-20: 12.5 mg via ORAL
  Filled 2017-03-20 (×2): qty 0.5

## 2017-03-20 MED ORDER — LIDOCAINE HCL (PF) 1 % IJ SOLN
INTRAMUSCULAR | Status: AC
Start: 2017-03-20 — End: 2017-03-20
  Filled 2017-03-20: qty 60

## 2017-03-20 MED ORDER — MIDAZOLAM HCL 5 MG/5ML IJ SOLN
INTRAMUSCULAR | Status: AC
  Filled 2017-03-20: qty 5

## 2017-03-20 MED ORDER — LIDOCAINE HCL (PF) 1 % IJ SOLN
INTRAMUSCULAR | Status: DC | PRN
  Administered 2017-03-20: 40 mL

## 2017-03-20 MED ORDER — SODIUM CHLORIDE 0.9 % IV SOLN
INTRAVENOUS | Status: DC
  Administered 2017-03-20: 08:00:00 via INTRAVENOUS

## 2017-03-20 MED ORDER — L-ARGININE 500 MG PO TABS: 1.0000 | ORAL_TABLET | Freq: Every day | ORAL | Status: DC

## 2017-03-20 MED ORDER — SODIUM CHLORIDE 0.9 % IR SOLN
80.0000 mg | Status: AC
  Administered 2017-03-20: 80 mg
  Filled 2017-03-20: qty 2

## 2017-03-20 MED ORDER — FENTANYL CITRATE (PF) 100 MCG/2ML IJ SOLN
INTRAMUSCULAR | Status: AC
  Filled 2017-03-20: qty 2

## 2017-03-20 MED ORDER — VITAMIN B-12 1000 MCG PO TABS
1000.0000 ug | ORAL_TABLET | Freq: Every day | ORAL | Status: DC
  Administered 2017-03-20: 12:00:00 1000 ug via ORAL
  Filled 2017-03-20 (×2): qty 1

## 2017-03-20 MED ORDER — ONDANSETRON HCL 4 MG/2ML IJ SOLN: 4.0000 mg | Freq: Four times a day (QID) | INTRAMUSCULAR | Status: DC | PRN

## 2017-03-20 MED ORDER — ASPIRIN EC 81 MG PO TBEC
81.0000 mg | DELAYED_RELEASE_TABLET | Freq: Every day | ORAL | Status: DC
  Administered 2017-03-20: 12:00:00 81 mg via ORAL
  Filled 2017-03-20 (×2): qty 1

## 2017-03-20 MED ORDER — HEPARIN (PORCINE) IN NACL 2-0.9 UNIT/ML-% IJ SOLN
INTRAMUSCULAR | Status: AC
  Filled 2017-03-20: qty 500

## 2017-03-20 MED ORDER — SIMVASTATIN 20 MG PO TABS
20.0000 mg | ORAL_TABLET | Freq: Every day | ORAL | Status: DC
  Administered 2017-03-20: 20 mg via ORAL
  Filled 2017-03-20: qty 1

## 2017-03-20 MED ORDER — MUPIROCIN 2 % EX OINT
TOPICAL_OINTMENT | CUTANEOUS | Status: AC
  Administered 2017-03-20: 1 via TOPICAL
  Filled 2017-03-20: qty 22

## 2017-03-20 MED ORDER — FENTANYL CITRATE (PF) 100 MCG/2ML IJ SOLN
INTRAMUSCULAR | Status: DC | PRN
  Administered 2017-03-20: 25 ug via INTRAVENOUS

## 2017-03-20 MED ORDER — OXYCODONE-ACETAMINOPHEN 5-325 MG PO TABS
1.0000 | ORAL_TABLET | Freq: Three times a day (TID) | ORAL | Status: DC | PRN
  Administered 2017-03-20 – 2017-03-21 (×2): 1 via ORAL
  Filled 2017-03-20 (×2): qty 1

## 2017-03-20 MED ORDER — HEPARIN (PORCINE) IN NACL 2-0.9 UNIT/ML-% IJ SOLN
INTRAMUSCULAR | Status: AC | PRN
  Administered 2017-03-20: 500 mL

## 2017-03-20 MED ORDER — CEFAZOLIN SODIUM-DEXTROSE 2-4 GM/100ML-% IV SOLN
2.0000 g | INTRAVENOUS | Status: AC
  Administered 2017-03-20: 2 g via INTRAVENOUS
  Filled 2017-03-20: qty 100

## 2017-03-20 MED ORDER — YOU HAVE A PACEMAKER BOOK
Freq: Once | Status: AC
  Administered 2017-03-20: 20:00:00
  Filled 2017-03-20: qty 1

## 2017-03-20 MED ORDER — CARVEDILOL 3.125 MG PO TABS
6.2500 mg | ORAL_TABLET | Freq: Two times a day (BID) | ORAL | Status: DC
  Administered 2017-03-20: 6.25 mg via ORAL
  Filled 2017-03-20 (×2): qty 2

## 2017-03-20 MED ORDER — MUPIROCIN 2 % EX OINT
1.0000 "application " | TOPICAL_OINTMENT | Freq: Once | CUTANEOUS | Status: AC
  Administered 2017-03-20: 1 via TOPICAL

## 2017-03-20 MED ORDER — SODIUM CHLORIDE 0.9 % IR SOLN
Status: AC
  Filled 2017-03-20: qty 2

## 2017-03-20 MED ORDER — CEFAZOLIN SODIUM-DEXTROSE 1-4 GM/50ML-% IV SOLN
1.0000 g | Freq: Four times a day (QID) | INTRAVENOUS | Status: AC
  Administered 2017-03-20 – 2017-03-21 (×3): 1 g via INTRAVENOUS
  Filled 2017-03-20 (×3): qty 50

## 2017-03-20 MED ORDER — DULOXETINE HCL 30 MG PO CPEP
30.0000 mg | ORAL_CAPSULE | Freq: Every day | ORAL | Status: DC
  Administered 2017-03-20: 30 mg via ORAL
  Filled 2017-03-20 (×2): qty 1

## 2017-03-20 MED ORDER — FUROSEMIDE 20 MG PO TABS
20.0000 mg | ORAL_TABLET | Freq: Every day | ORAL | Status: DC
  Administered 2017-03-20: 12:00:00 20 mg via ORAL
  Filled 2017-03-20 (×2): qty 1

## 2017-03-20 MED ORDER — ACETAMINOPHEN 325 MG PO TABS: 325.0000 mg | ORAL_TABLET | ORAL | Status: DC | PRN

## 2017-03-20 SURGICAL SUPPLY — 7 items
CABLE SURGICAL S-101-97-12 (CABLE) ×1 IMPLANT
ICD VISIA MRI VR DVFB1D4 (ICD Generator) IMPLANT
LEAD SPRINT QUAT SEC 6935M-62 (Lead) ×1 IMPLANT
PAD DEFIB LIFELINK (PAD) ×1 IMPLANT
SHEATH CLASSIC 9F (SHEATH) ×1 IMPLANT
TRAY PACEMAKER INSERTION (PACKS) ×1 IMPLANT
VISIA MRI VR DVFB1D4 (ICD Generator) ×2 IMPLANT

## 2017-03-20 NOTE — Discharge Summary (Signed)
ELECTROPHYSIOLOGY PROCEDURE DISCHARGE SUMMARY    Patient ID: Bruce Robertson,  MRN: [id number], DOB/AGE: 02-18-1961 56 y.o.  Admit date: 03/20/2017 Discharge date: 03/21/2017  Primary Care Physician: Darden Amber, PA Primary Cardiologist: [name] Electrophysiologist: Curt Bears  Primary Discharge Diagnosis:  NICM s/p ICD implant this admission  Secondary Discharge Diagnosis:  1.  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  Hyperlipidemia  No Known Allergies   Procedures This Admission:  1.  Implantation of a MDT single chamber ICD on 03/20/17 by Dr Curt Bears.  The patient received a MDT model number Visia AF ICD with model number 8088 right ventricular lead.  DFT's were deferred at time of implant.  There were no immediate post procedure complications. 2.  CXR on 03/21/17 demonstrated no pneumothorax status post device implantation.   Brief HPI: Bruce Robertson is a 56 y.o. male was referred to electrophysiology in the outpatient setting for consideration of ICD implantation.  The patient has persistent LV dysfunction despite guideline directed therapy.  Risks, benefits, and alternatives to ICD implantation were reviewed with the patient who wished to proceed.   Hospital Course:  The patient was admitted and underwent implantation of a MDT single chamber ICD with details as outlined above. He was monitored on telemetry overnight which demonstrated sinus rhythm.  Left chest was without hematoma or ecchymosis.  The device was interrogated and found to be functioning normally.  CXR was obtained and demonstrated no pneumothorax status post device implantation.  Wound care, arm mobility, and restrictions were reviewed with the patient.  The patient was examined and considered stable for discharge to home.   The patient's discharge medications include an ARB (Losartan) and beta blocker (Coreg).   Physical Exam: Vitals:   03/20/17 1923 03/20/17 2000 03/21/17 0122 03/21/17 0730   BP: 119/78 114/74 107/71 113/74  Pulse: (!) 55 (!) 56 (!) 57 (!) 53  Resp: 11 18 18 12   Temp: 98.2 F (36.8 C)  97.9 F (36.6 C) 97.8 F (36.6 C)  TempSrc: Oral  Axillary Oral  SpO2: 99% 99% 100% 99%  Weight:   246 lb 11.1 oz (111.9 kg)   Height:        GEN- The patient is well appearing, alert and oriented x 3 today.   HEENT: normocephalic, atraumatic; sclera clear, conjunctiva pink; hearing intact; oropharynx clear; neck supple  Lungs- Clear to ausculation bilaterally, normal work of breathing.  No wheezes, rales, rhonchi Heart- Regular rate and rhythm  GI- soft, non-tender, non-distended, bowel sounds present  Extremities- no clubbing, cyanosis, or edema  MS- no significant deformity or atrophy Skin- warm and dry, no rash or lesion, left chest without hematoma/ecchymosis Psych- euthymic mood, full affect Neuro- strength and sensation are intact   Labs:   Lab Results  Component Value Date   WBC 4.8 03/20/2017   HGB 13.8 03/20/2017   HCT 42.0 03/20/2017   MCV 87.1 03/20/2017   PLT 150 03/20/2017     Recent Labs Lab 03/20/17 0827  NA 137  K 3.8  CL 104  CO2 23  BUN 17  CREATININE 1.29*  CALCIUM 8.8*  GLUCOSE 107*    Discharge Medications:  Allergies as of 03/21/2017   No Known Allergies     Medication List    TAKE these medications   aspirin 81 MG EC tablet Take 1 tablet (81 mg total) by mouth daily.   carvedilol 6.25 MG tablet Commonly known as:  COREG Take 1 tablet (6.25 mg  total) by mouth 2 (two) times daily with a meal.   DULoxetine 30 MG capsule Commonly known as:  CYMBALTA Take 1 capsule by mouth daily.   furosemide 40 MG tablet Commonly known as:  LASIX Take 1 tablet (40 mg total) by mouth daily. What changed:  how much to take   L-Arginine 500 MG Tabs Take 1 tablet by mouth daily.   losartan 25 MG tablet Commonly known as:  COZAAR TAKE ONE-HALF TABLET BY MOUTH ONCE DAILY   simvastatin 20 MG tablet Commonly known as:   ZOCOR Take 1 tablet (20 mg total) by mouth at bedtime.   spironolactone 25 MG tablet Commonly known as:  ALDACTONE Take 0.5 tablets (12.5 mg total) by mouth daily.   vitamin B-12 1000 MCG tablet Commonly known as:  CYANOCOBALAMIN Take 1,000 mcg by mouth daily.       Disposition:  Discharge Instructions    Diet - low sodium heart healthy    Complete by:  As directed    Increase activity slowly    Complete by:  As directed      Follow-up Information    [place] [facility] Follow up on 04/02/2017.   Specialty:  Cardiology Why:  at [facility] for wound check  Contact information: [address], [place]       Constance Haw, MD Follow up on 06/24/2017.   Specialty:  Cardiology Why:  at 9:45AM Contact information: [place] [address] [phone]           Duration of Discharge Encounter: Greater than 30 minutes including physician time.  Signed, Chanetta Marshall, NP 03/21/2017 9:36 AM  I have seen and examined this patient with Chanetta Marshall.  Agree with above, note added to reflect my findings.  On exam, RRR, no murmurs, lungs clear. Presented to the hospital yesterday for ICD implantation for nonischemic cardio myopathy. He tolerated the procedure well with no procedural complications. Device interrogation and chest x-ray without major abnormality. Plan for discharge today with follow-up in device clinic.    Lariyah Shetterly M. Emilianna Barlowe MD 03/21/2017 12:18 PM

## 2017-03-20 NOTE — Care Management Note (Signed)
Case Management Note  Patient Details  Name: Bruce Robertson MRN: [id number] Date of Birth: December 15, 1960  Subjective/Objective:     From home,   Presents with  Nonischemic cardiomyopathy, His ejection fraction has been low, s/p  ICD today.              Action/Plan: NCM will follow for dc needs.   Expected Discharge Date:                  Expected Discharge Plan:  Home/Self Care  In-House Referral:     Discharge planning Services  CM Consult  Post Acute Care Choice:    Choice offered to:     DME Arranged:    DME Agency:     HH Arranged:    HH Agency:     Status of Service:  In process, will continue to follow  If discussed at Long Length of Stay Meetings, dates discussed:    Additional Comments:  Zenon Mayo, RN 03/20/2017, 11:35 AM

## 2017-03-20 NOTE — H&P (Signed)
Bruce Robertson is a 56 y.o. male who is being seen today for the evaluation of nonischemic cardiomyopathy.  Repeat echo showed a stable ejection fraction of 25%. His medications have been titrated, but blood pressure precludes significant change. Today he is feeling well without major complaint. He says he has his good days and bad days. He does have episodic shortness of breath.  Today, he denies symptoms of palpitations, chest pain, shortness of breath, orthopnea, PND, lower extremity edema, claudication, dizziness, presyncope, syncope, bleeding, or neurologic sequela. The patient is tolerating medications without difficulties.        Past Medical History:  Diagnosis Date  . Cardiomyopathy    Nonischemic. EF has been about 45%. S/P CABG.;  b.  Echo 4/14: EF 25%, global HK with inf and mid apical AK, restrictive physiology with E/e' > 15 (elevated LV filling pressure), trivial AI/MR, mild to mod LAE, mild RVE, mild reduced RVSF, mild RAE, mod TR, PASP 74 (severe pulmonary HTN)   . CHF (congestive heart failure) ([place])   . Colon polyps 2002  . Coronary artery disease   . Dyslipidemia   . History of echocardiogram    Echo 10/16:  EF 25-30%, poss non-compaction, diff HK with inf-lat and apical HK, restrictive physio, mild AI, severe LAE, mild RVE with mild reduced RVSF, PASP 42 mmHg  . Hyperplastic rectal polyp   . Reflux   . Sleep apnea    Mild  . Tobacco abuse    Remote  . Ulcer ([place])    In GI tract        Past Surgical History:  Procedure Laterality Date  . CARDIAC CATHETERIZATION  2007 and 2009    x 2  . CORONARY ARTERY BYPASS GRAFT  2007   Had a left main dissection after catheterization. Underwent a saphenous vein graft to the LAD and a saphenous vein graft to obtuse marginal.  . LEG TENDON SURGERY  2005   Right patellar           Current Outpatient Prescriptions  Medication Sig Dispense Refill  . aspirin EC 81 MG EC tablet Take 1 tablet (81  mg total) by mouth daily. 30 tablet 3  . carvedilol (COREG) 6.25 MG tablet Take 1 tablet (6.25 mg total) by mouth 2 (two) times daily with a meal. 60 tablet 3  . DULoxetine (CYMBALTA) 30 MG capsule Take 1 capsule by mouth daily.    . furosemide (LASIX) 40 MG tablet Take 1 tablet (40 mg total) by mouth daily. 30 tablet 11  . losartan (COZAAR) 25 MG tablet TAKE ONE-HALF TABLET BY MOUTH ONCE DAILY 15 tablet 11  . spironolactone (ALDACTONE) 25 MG tablet Take 0.5 tablets (12.5 mg total) by mouth daily. 90 tablet 3  . simvastatin (ZOCOR) 20 MG tablet Take 1 tablet (20 mg total) by mouth at bedtime. 30 tablet 3   No current facility-administered medications for this visit.     Allergies:   Patient has no known allergies.   Social History:  The patient  reports that he quit smoking about 12 years ago. He has never used smokeless tobacco. He reports that he drinks alcohol. He reports that he does not use drugs.   Family History:  The patient's family history includes Cancer in his other; Heart failure in his father; Hypertension in his mother; Lung cancer in his other; Osteoarthritis in his mother.    ROS:  Please see the history of present illness.   Otherwise, review of systems is positive  for Cough, shortness of breath, bloody stools, depression, anxiety.   All other systems are reviewed and negative.    PHYSICAL EXAM: Vitals:   03/20/17 0711  BP: (P) 105/71  Pulse: (!) (P) 51  Resp: (P) 18  Temp: (P) 97.8 F (36.6 C)    GEN: Well nourished, well developed, in no acute distress  HEENT: normal  Neck: no JVD, carotid bruits, or masses Cardiac: RRR; no murmurs, rubs, or gallops,no edema  Respiratory:  clear to auscultation bilaterally, normal work of breathing GI: soft, nontender, nondistended, + BS MS: no deformity or atrophy  Skin: warm and dry Neuro:  Strength and sensation are intact Psych: euthymic mood, full affect  EKG:  EKG is ordered today. Personal review of  the ekg ordered shows sinus rhythm, rate 48, right axis deviation  Recent Labs: 07/31/2016: B Natriuretic Peptide 533.9; Hemoglobin 13.4; Platelets 140 08/01/2016: ALT 25 01/23/2017: BUN 16; Creat 1.45; Potassium 4.9; Sodium 141    Lipid Panel  Labs(Brief)          Component Value Date/Time   CHOL 200 08/01/2016 0453   TRIG 75 08/01/2016 0453   HDL 49 08/01/2016 0453   CHOLHDL 4.1 08/01/2016 0453   VLDL 15 08/01/2016 0453   LDLCALC 136 (H) 08/01/2016 0453   LDLDIRECT 166.4 05/17/2010 0905          Wt Readings from Last 3 Encounters:  02/11/17 241 lb (109.3 kg)  01/23/17 242 lb 12.8 oz (110.1 kg)  10/24/16 244 lb (110.7 kg)      Other studies Reviewed: Additional studies/ records that were reviewed today include: TTE 10/15/16  Review of the above records today demonstrates:  - Left ventricle: The cavity size was severely dilated. Wall thickness was increased in a pattern of mild LVH. Systolic function was severely reduced. The estimated ejection fraction was in the range of 20% to 25%. Diffuse hypokinesis. There is akinesis of the inferolateral and inferior myocardium. Doppler parameters are consistent with restrictive physiology, indicative of decreased left ventricular diastolic compliance and/or increased left atrial pressure. - Aortic valve: There was trivial regurgitation. - Mitral valve: There was mild regurgitation. - Left atrium: The atrium was moderately to severely dilated. - Right atrium: The atrium was mildly dilated. - Pulmonary arteries: Systolic pressure was moderately increased. PA peak pressure: 46 mm Hg (S).    ASSESSMENT AND PLAN:  1.  Nonischemic cardiomyopathy: Currently on optimal medical therapy with carvedilol, losartan, and Aldactone. His ejection fraction has consistently been low. Plan for ICD today. Risks and benefits discussed. Risks include but not limited to bleeding, infection, tamponade,  pneumothorax. He understands the risks and has agreed to the procedure.  2. Sleep apnea: compliant with CPAP   Disposition:   FU with Happy Begeman 3 months  Signed, Keanna Tugwell [name], MD  02/11/2017 11:42 AM      Allegra Lai, MD 03/20/2017 7:10 AM  ICD Criteria  Current LVEF:20-25%. Within 12 months prior to implant: Yes   Heart failure history: Yes, Class II  Cardiomyopathy history: Yes, Non-Ischemic Cardiomyopathy.  Atrial Fibrillation/Atrial Flutter: No.  Ventricular tachycardia history: No.  Cardiac arrest history: No.  History of syndromes with risk of sudden death: No.  Previous ICD: No.  Current ICD indication: Primary  PPM indication: No.   Class I or II Bradycardia indication present: No  Beta Blocker therapy for 3 or more months: Yes, prescribed.   Ace Inhibitor/ARB therapy for 3 or more months: Yes, prescribed.

## 2017-03-20 NOTE — Discharge Instructions (Signed)
° ° °  Supplemental Discharge Instructions for  °Pacemaker/Defibrillator Patients ° °Activity °No heavy lifting or vigorous activity with your left/right arm for 6 to 8 weeks.  Do not raise your left/right arm above your head for one week.  Gradually raise your affected arm as drawn below. ° °        ° °__         03/24/17                      03/25/17                      03/26/17                 03/27/17 ° °NO DRIVING for 1 week    ; you may begin driving on  03/27/17   . ° °WOUND CARE °- Keep the wound area clean and dry.  Do not get this area wet for one week. No showers for one week; you may shower on  03/27/17   . °- The tape/steri-strips on your wound will fall off; do not pull them off.  No bandage is needed on the site.  DO  NOT apply any creams, oils, or ointments to the wound area. °- If you notice any drainage or discharge from the wound, any swelling or bruising at the site, or you develop a fever > 101? F after you are discharged home, call the office at once. ° °Special Instructions °- You are still able to use cellular telephones; use the ear opposite the side where you have your pacemaker/defibrillator.  Avoid carrying your cellular phone near your device. °- When traveling through airports, show security personnel your identification card to avoid being screened in the metal detectors.  Ask the security personnel to use the hand wand. °- Avoid arc welding equipment, MRI testing (magnetic resonance imaging), TENS units (transcutaneous nerve stimulators).  Call the office for questions about other devices. °- Avoid electrical appliances that are in poor condition or are not properly grounded. °- Microwave ovens are safe to be near or to operate. ° °Additional information for defibrillator patients should your device go off: °- If your device goes off ONCE and you feel fine afterward, notify the device clinic nurses. °- If your device goes off ONCE and you do not feel well afterward, call 911. °- If your  device goes off TWICE, call 911. °- If your device goes off THREE times in one day, call 911. ° °DO NOT DRIVE YOURSELF OR A FAMILY MEMBER °WITH A DEFIBRILLATOR TO THE HOSPITAL--CALL 911. ° °

## 2017-03-20 NOTE — Progress Notes (Signed)
PHARMACIST - PHYSICIAN ORDER COMMUNICATION  CONCERNING: P&T Medication Policy on Herbal Medications  DESCRIPTION:  This patient's order for:  L-arginine  has been noted.  This product(s) is classified as an "herbal" or natural product. Due to a lack of definitive safety studies or FDA approval, nonstandard manufacturing practices, plus the potential risk of unknown drug-drug interactions while on inpatient medications, the Pharmacy and Therapeutics Committee does not permit the use of "herbal" or natural products of this type within [facility].   ACTION TAKEN: The pharmacy department is unable to verify this order at this time.  Please reevaluate patient's clinical condition at discharge and address if the herbal or natural product(s) should be resumed at that time.

## 2017-03-21 ENCOUNTER — Ambulatory Visit (HOSPITAL_COMMUNITY): Payer: Medicaid Other

## 2017-03-21 DIAGNOSIS — I509 Heart failure, unspecified: Secondary | ICD-10-CM | POA: Diagnosis not present

## 2017-03-21 DIAGNOSIS — I2510 Atherosclerotic heart disease of native coronary artery without angina pectoris: Secondary | ICD-10-CM | POA: Diagnosis not present

## 2017-03-21 DIAGNOSIS — I428 Other cardiomyopathies: Secondary | ICD-10-CM | POA: Diagnosis not present

## 2017-03-21 DIAGNOSIS — E785 Hyperlipidemia, unspecified: Secondary | ICD-10-CM | POA: Diagnosis not present

## 2017-04-02 ENCOUNTER — Ambulatory Visit (INDEPENDENT_AMBULATORY_CARE_PROVIDER_SITE_OTHER): Payer: Self-pay | Admitting: *Deleted

## 2017-04-02 DIAGNOSIS — I428 Other cardiomyopathies: Secondary | ICD-10-CM

## 2017-04-02 NOTE — Progress Notes (Signed)
Wound check appointment. Steri-strips removed. Wound without redness or edema. Incision edges approximated, wound well healed. Normal device function. Threshold, sensing, and impedances consistent with implant measurements. Device programmed at 3.5V for extra safety margin until 3 month visit. Histogram distribution appropriate for patient and level of activity. No ventricular arrhythmias noted. Patient educated about wound care, arm mobility, lifting restrictions, shock plan. ROV 06/24/17 w/ WC

## 2017-06-23 NOTE — Progress Notes (Addendum)
Electrophysiology Office Note   Date:  06/24/2017   ID:  Bruce Robertson, DOB 1961/08/26, MRN [id number]  PCP:  Darden Amber, PA  Cardiologist:  [facility] Electrophysiologist:  Will [name], MD    Chief Complaint  Patient presents with  . Defib Check    Nonischemic Cardiomyopathy/Chronic systolic CHF     History of Present Illness: Bruce Robertson is a 56 y.o. male who is being seen today for the evaluation of nonischemic cardiomyopathy at the request of [name], MD. Presenting today for electrophysiology evaluation. Repeat echo showed a stable ejection fraction of 25%. Had a Medtronic ICD implanted on 03/20/17.  Today, denies symptoms of palpitations, chest pain, shortness of breath, orthopnea, PND, lower extremity edema, claudication, dizziness, presyncope, syncope, bleeding, or neurologic sequela. The patient is tolerating medications without difficulties and is otherwise without complaint today.  He has been having some bloody stools that he feels like are due to hemorrhoids. He says he will try stool softeners.    Past Medical History:  Diagnosis Date  . AICD (automatic cardioverter/defibrillator) present   . Anxiety   . Cardiomyopathy    Nonischemic. EF has been about 45%. S/P CABG.;  b.  Echo 4/14: EF 25%, global HK with inf and mid apical AK, restrictive physiology with E/e' > 15 (elevated LV filling pressure), trivial AI/MR, mild to mod LAE, mild RVE, mild reduced RVSF, mild RAE, mod TR, PASP 74 (severe pulmonary HTN)   . CHF (congestive heart failure) ([place])   . Colon polyps 2002  . Coronary artery disease   . Depression   . Dyslipidemia    hx (03/20/2017)  . GERD (gastroesophageal reflux disease)   . History of bleeding peptic ulcer   . History of echocardiogram    Echo 10/16:  EF 25-30%, poss non-compaction, diff HK with inf-lat and apical HK, restrictive physio, mild AI, severe LAE, mild RVE with mild reduced RVSF, PASP 42 mmHg  .  Hyperplastic rectal polyp   . OSA on CPAP    Mild  . Tobacco abuse    Remote   Past Surgical History:  Procedure Laterality Date  . CARDIAC CATHETERIZATION  2007;  2009  . CARDIAC DEFIBRILLATOR PLACEMENT  03/20/2017  . CORONARY ARTERY BYPASS GRAFT  2007   Had a left main dissection after catheterization. Underwent a saphenous vein graft to the LAD and a saphenous vein graft to obtuse marginal.  . ICD IMPLANT N/A 03/20/2017   Procedure: ICD Implant;  Surgeon: Constance Haw, MD;  Location: [place] [facility];  Service: Cardiovascular;  Laterality: N/A;  . PATELLAR TENDON REPAIR Right 2005     Current Outpatient Prescriptions  Medication Sig Dispense Refill  . aspirin EC 81 MG EC tablet Take 1 tablet (81 mg total) by mouth daily. 30 tablet 3  . carvedilol (COREG) 6.25 MG tablet Take 1 tablet (6.25 mg total) by mouth 2 (two) times daily with a meal. 60 tablet 3  . L-Arginine 500 MG TABS Take 1 tablet by mouth daily.    Marland Kitchen losartan (COZAAR) 25 MG tablet TAKE ONE-HALF TABLET BY MOUTH ONCE DAILY 15 tablet 11  . spironolactone (ALDACTONE) 25 MG tablet Take 0.5 tablets (12.5 mg total) by mouth daily. 90 tablet 3  . vitamin B-12 (CYANOCOBALAMIN) 1000 MCG tablet Take 1,000 mcg by mouth daily.    . simvastatin (ZOCOR) 20 MG tablet Take 1 tablet (20 mg total) by mouth at bedtime. 30 tablet 3   No current facility-administered medications  for this visit.     Allergies:   Patient has no known allergies.   Social History:  The patient  reports that he has quit smoking. His smoking use included Cigarettes. He has a 0.70 pack-year smoking history. He has never used smokeless tobacco. He reports that he drinks alcohol. He reports that he uses drugs, including Marijuana.   Family History:  The patient's family history includes Cancer in his other; Heart failure in his father; Hypertension in his mother; Lung cancer in his other; Osteoarthritis in his mother.    ROS:  Please see the history of  present illness.   Otherwise, review of systems is positive for Sweats, fatigue, cough, dyspnea on exertion, joint swelling, dizziness, bloody stools.   All other systems are reviewed and negative.   PHYSICAL EXAM: VS:  BP 110/64   Pulse (!) 54   Ht 6\' 1"  (1.854 m)   Wt 250 lb 3.2 oz (113.5 kg)   BMI 33.01 kg/m  , BMI Body mass index is 33.01 kg/m. GEN: Well nourished, well developed, in no acute distress  HEENT: normal  Neck: no JVD, carotid bruits, or masses Cardiac: RRR; no murmurs, rubs, or gallops,no edema  Respiratory:  clear to auscultation bilaterally, normal work of breathing GI: soft, nontender, nondistended, + BS MS: no deformity or atrophy  Skin: warm and dry, device site well healed Neuro:  Strength and sensation are intact Psych: euthymic mood, full affect  EKG:  EKG is ordered today. Personal review of the ekg ordered shows sinus rhythm, PVCs, low voltage, nonspecific T wave abnormalities  Personal review of the device interrogation today. Results in [place]: 07/31/2016: B Natriuretic Peptide 533.9 08/01/2016: ALT 25 03/20/2017: BUN 17; Creatinine, Ser 1.29; Hemoglobin 13.8; Platelets 150; Potassium 3.8; Sodium 137    Lipid Panel     Component Value Date/Time   CHOL 200 08/01/2016 0453   TRIG 75 08/01/2016 0453   HDL 49 08/01/2016 0453   CHOLHDL 4.1 08/01/2016 0453   VLDL 15 08/01/2016 0453   LDLCALC 136 (H) 08/01/2016 0453   LDLDIRECT 166.4 05/17/2010 0905     Wt Readings from Last 3 Encounters:  06/24/17 250 lb 3.2 oz (113.5 kg)  03/21/17 246 lb 11.1 oz (111.9 kg)  02/11/17 241 lb (109.3 kg)      Other studies Reviewed: Additional studies/ records that were reviewed today include: TTE 10/15/16  Review of the above records today demonstrates:  - Left ventricle: The cavity size was severely dilated. Wall   thickness was increased in a pattern of mild LVH. Systolic   function was severely reduced. The estimated ejection fraction   was  in the range of 20% to 25%. Diffuse hypokinesis. There is   akinesis of the inferolateral and inferior myocardium. Doppler   parameters are consistent with restrictive physiology, indicative   of decreased left ventricular diastolic compliance and/or   increased left atrial pressure. - Aortic valve: There was trivial regurgitation. - Mitral valve: There was mild regurgitation. - Left atrium: The atrium was moderately to severely dilated. - Right atrium: The atrium was mildly dilated. - Pulmonary arteries: Systolic pressure was moderately increased.   PA peak pressure: 46 mm Hg (S).    ASSESSMENT AND PLAN:  1.  Nonischemic cardiomyopathy: On Coreg, losartan, and Aldactone. Medtronic ICD implanted 03/20/17. Device functioning appropriately. No changes at this time.  2. Sleep apnea: Compliant with CPAP. No changes at this time.   Current medicines are reviewed at length  with the patient today.   The patient does not have concerns regarding his medicines.  The following changes were made today:  none  Labs/ tests ordered today include:  Orders Placed This Encounter  Procedures  . EKG 12-Lead     Disposition:   FU with Will Camnitz 9 months  Signed, Will [name], MD  06/24/2017 10:17 AM     [facility] [address] [place] [address] [phone] (office) [phone] (fax)

## 2017-06-24 ENCOUNTER — Encounter: Payer: Self-pay | Admitting: Cardiology

## 2017-06-24 ENCOUNTER — Ambulatory Visit (INDEPENDENT_AMBULATORY_CARE_PROVIDER_SITE_OTHER): Payer: Self-pay | Admitting: Cardiology

## 2017-06-24 ENCOUNTER — Encounter (INDEPENDENT_AMBULATORY_CARE_PROVIDER_SITE_OTHER): Payer: Self-pay

## 2017-06-24 VITALS — BP 110/64 | HR 54 | Ht 73.0 in | Wt 250.2 lb

## 2017-06-24 DIAGNOSIS — I5022 Chronic systolic (congestive) heart failure: Secondary | ICD-10-CM

## 2017-06-24 DIAGNOSIS — Z95810 Presence of automatic (implantable) cardiac defibrillator: Secondary | ICD-10-CM

## 2017-06-24 DIAGNOSIS — G4733 Obstructive sleep apnea (adult) (pediatric): Secondary | ICD-10-CM

## 2017-06-24 DIAGNOSIS — I428 Other cardiomyopathies: Secondary | ICD-10-CM

## 2017-06-24 LAB — CUP PACEART INCLINIC DEVICE CHECK
Battery Remaining Longevity: 135 mo
Battery Voltage: 3.14 V
Brady Statistic RV Percent Paced: 0.21 %
Date Time Interrogation Session: 20180821112525
HighPow Impedance: 61 Ohm
Implantable Lead Implant Date: 20180517
Implantable Lead Location: 753860
Implantable Pulse Generator Implant Date: 20180517
Lead Channel Impedance Value: 361 Ohm
Lead Channel Impedance Value: 456 Ohm
Lead Channel Pacing Threshold Amplitude: 0.75 V
Lead Channel Pacing Threshold Pulse Width: 0.4 ms
Lead Channel Sensing Intrinsic Amplitude: 11.5 mV
Lead Channel Sensing Intrinsic Amplitude: 14.5 mV
Lead Channel Setting Pacing Amplitude: 2.5 V
Lead Channel Setting Pacing Pulse Width: 0.4 ms
Lead Channel Setting Sensing Sensitivity: 0.3 mV

## 2017-06-24 NOTE — Patient Instructions (Signed)
Medication Instructions:  Your physician recommends that you continue on your current medications as directed. Please refer to the Current Medication list given to you today.  --- If you need a refill on your cardiac medications before your next appointment, please call your pharmacy. ---  Labwork: None ordered  Testing/Procedures: None ordered  Follow-Up: Remote monitoring is used to monitor your Pacemaker of ICD from home. This monitoring reduces the number of office visits required to check your device to one time per year. It allows [place] to keep an eye on the functioning of your device to ensure it is working properly. You are scheduled for a device check from home on 09/23/2017. You may send your transmission at any time that day. If you have a wireless device, the transmission will be sent automatically. After your physician reviews your transmission, you will receive a postcard with your next transmission date.  Your physician wants you to follow-up in: 9 months with Dr. Curt Bears.  You will receive a reminder letter in the mail two months in advance. If you don't receive a letter, please call our office to schedule the follow-up appointment.  Thank you for choosing [facility]!!   Trinidad Curet, RN [phone]

## 2017-08-29 ENCOUNTER — Other Ambulatory Visit (HOSPITAL_COMMUNITY): Payer: Self-pay | Admitting: Internal Medicine

## 2017-09-23 ENCOUNTER — Ambulatory Visit (INDEPENDENT_AMBULATORY_CARE_PROVIDER_SITE_OTHER): Payer: Self-pay | Admitting: *Deleted

## 2017-09-23 DIAGNOSIS — I428 Other cardiomyopathies: Secondary | ICD-10-CM

## 2017-09-24 LAB — CUP PACEART REMOTE DEVICE CHECK
Battery Remaining Longevity: 134 mo
Battery Voltage: 3.08 V
Brady Statistic RV Percent Paced: 0.08 %
Date Time Interrogation Session: 20181120152404
HighPow Impedance: 60 Ohm
Implantable Lead Implant Date: 20180517
Implantable Lead Location: 753860
Implantable Pulse Generator Implant Date: 20180517
Lead Channel Impedance Value: 361 Ohm
Lead Channel Impedance Value: 456 Ohm
Lead Channel Pacing Threshold Amplitude: 0.625 V
Lead Channel Pacing Threshold Pulse Width: 0.4 ms
Lead Channel Sensing Intrinsic Amplitude: 11.125 mV
Lead Channel Sensing Intrinsic Amplitude: 11.125 mV
Lead Channel Setting Pacing Amplitude: 2.5 V
Lead Channel Setting Pacing Pulse Width: 0.4 ms
Lead Channel Setting Sensing Sensitivity: 0.3 mV

## 2017-09-24 NOTE — Progress Notes (Signed)
Remote ICD transmission.   

## 2017-10-02 ENCOUNTER — Encounter: Payer: Self-pay | Admitting: Cardiology

## 2017-10-02 ENCOUNTER — Other Ambulatory Visit (HOSPITAL_COMMUNITY): Payer: Self-pay | Admitting: Internal Medicine

## 2017-10-11 ENCOUNTER — Other Ambulatory Visit: Payer: Self-pay | Admitting: Cardiology

## 2017-11-14 ENCOUNTER — Telehealth: Payer: Self-pay | Admitting: Cardiology

## 2017-11-14 NOTE — Telephone Encounter (Signed)
Can double book at 30 N on Monday 1/14

## 2017-11-14 NOTE — Telephone Encounter (Signed)
New message  Please call [phone].  Patient calling with concerns about shortness of breath that has been going on for at least 1 month.   .Pt c/o Shortness Of Breath: STAT if SOB developed within the last 24 hours or pt is noticeably SOB on the phone  1. Are you currently SOB (can you hear that pt is SOB on the phone)? NO  2. How long have you been experiencing SOB? 1 month  3. Are you SOB when sitting or when up moving around? Sitting and moving  4. Are you currently experiencing any other symptoms? Swollen feet   Pt c/o swelling: STAT is pt has developed SOB within 24 hours  1) How much weight have you gained and in what time span? n/a  2) If swelling, where is the swelling located?feet  Are you currently taking a fluid pill? yes 3) Are you currently SOB? No  4) Do you have a log of your daily weights (if so, list)? NO  5) Have you gained 3 pounds in a day or 5 pounds in a week? N/A  6) Have you traveled recently? NO

## 2017-11-14 NOTE — Telephone Encounter (Signed)
Pt calling c/o increased SOB and swelling bilaterally in hands.  Some swelling in ankles - right worse than left. + for cough with "thick white fluid."  Pt has had these s/s X 1 month or more.  Attempted to have pt scheduled with either APP or [facility] however he would prefer to see Dr [name] and states "I done waited this long"  appt scheduled as requested at next available (2/25).  Pt aware I will forward this information to Dr [name] for review.  Advised if s/s worsen prior to appt to call back or report to ED for treatment.  Pt states understanding and thanked me for my time.

## 2017-11-17 ENCOUNTER — Telehealth: Payer: Self-pay | Admitting: *Deleted

## 2017-11-17 ENCOUNTER — Encounter: Payer: Self-pay | Admitting: Cardiology

## 2017-11-17 ENCOUNTER — Ambulatory Visit (INDEPENDENT_AMBULATORY_CARE_PROVIDER_SITE_OTHER): Payer: Self-pay | Admitting: Cardiology

## 2017-11-17 VITALS — BP 100/54 | HR 65 | Ht 73.0 in | Wt 253.0 lb

## 2017-11-17 DIAGNOSIS — Z79899 Other long term (current) drug therapy: Secondary | ICD-10-CM

## 2017-11-17 DIAGNOSIS — I420 Dilated cardiomyopathy: Secondary | ICD-10-CM

## 2017-11-17 DIAGNOSIS — G4730 Sleep apnea, unspecified: Secondary | ICD-10-CM

## 2017-11-17 LAB — BASIC METABOLIC PANEL
BUN/Creatinine Ratio: 10 (ref 9–20)
BUN: 16 mg/dL (ref 6–24)
CO2: 28 mmol/L (ref 20–29)
Calcium: 10.5 mg/dL — ABNORMAL HIGH (ref 8.7–10.2)
Chloride: 101 mmol/L (ref 96–106)
Creatinine, Ser: 1.65 mg/dL — ABNORMAL HIGH (ref 0.76–1.27)
GFR calc Af Amer: 54 mL/min/{1.73_m2} — ABNORMAL LOW (ref >59)
GFR calc non Af Amer: 46 mL/min/{1.73_m2} — ABNORMAL LOW (ref >59)
Glucose: 116 mg/dL — ABNORMAL HIGH (ref 65–99)
Potassium: 5.3 mmol/L — ABNORMAL HIGH (ref 3.5–5.2)
Sodium: 143 mmol/L (ref 134–144)

## 2017-11-17 NOTE — Progress Notes (Signed)
HPI The patient presents for followup of his cardiomyopathy. Years ago he had an ejection fraction of 40%. 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 He did end up requiring bypass because during his diagnostic catheterization he had dissection of his left main vessel. His ejection fraction eventually improved to about 45% with medications. The etiology was thought probably to be hypertensive.  Subsequent echocardiogram demonstrates his EF now to be about 25%.  I repeated an echo recently and the EF was still 20 - 25%.  He has been followed in the HF clinic. Meds have been titrated slightly but his blood pressure precludes significant movement.   He is now status post ICD implant.  He called today because his hands were swollen and he had cough with sputum.  He was added to the schedule.  He says intermittently he will get coughing up of thick white sputum.  He does not describe pink frothy or thin sputum.  He does not have chest pressure, neck or arm discomfort.  He does not have any palpitations, presyncope or syncope.  He does not describe any PND.  He only weighs himself once every couple of weeks.  He says he does watch his salt.  He has CPAP but he seems to wear this intermittently.  He does not think it gives him enough oxygen.  He takes it off most nights.  I would suspect he is not wearing it frequently at all.  Of note he was unsure some of the doses of his medications.   No Known Allergies  Current Outpatient Medications  Medication Sig Dispense Refill  . aspirin EC 81 MG EC tablet Take 1 tablet (81 mg total) by mouth daily. 30 tablet 3  . carvedilol (COREG) 12.5 MG tablet TAKE ONE TABLET BY MOUTH TWICE DAILY WITH MEALS 180 tablet 2  . furosemide (LASIX) 20 MG tablet Take 20 mg by mouth daily.    Marland Kitchen L-Arginine 500 MG TABS Take 1 tablet by mouth daily.    Marland Kitchen losartan (COZAAR) 25 MG tablet TAKE ONE-HALF TABLET BY MOUTH ONCE DAILY 15 tablet 11  . simvastatin (ZOCOR) 20 MG tablet TAKE ONE TABLET BY  MOUTH AT BEDTIME 90 tablet 3  . spironolactone (ALDACTONE) 25 MG tablet Take 0.5 tablets (12.5 mg total) by mouth daily. 45 tablet 2  . vitamin B-12 (CYANOCOBALAMIN) 1000 MCG tablet Take 1,000 mcg by mouth daily.     No current facility-administered medications for this visit.     Past Medical History:  Diagnosis Date  . AICD (automatic cardioverter/defibrillator) present   . Anxiety   . Cardiomyopathy    Nonischemic. EF has been about 45%. S/P CABG.;  b.  Echo 4/14: EF 25%, global HK with inf and mid apical AK, restrictive physiology with E/e' > 15 (elevated LV filling pressure), trivial AI/MR, mild to mod LAE, mild RVE, mild reduced RVSF, mild RAE, mod TR, PASP 74 (severe pulmonary HTN)   . CHF (congestive heart failure) ([place])   . Colon polyps 2002  . Coronary artery disease   . Depression   . Dyslipidemia    hx (03/20/2017)  . GERD (gastroesophageal reflux disease)   . History of bleeding peptic ulcer   . History of echocardiogram    Echo 10/16:  EF 25-30%, poss non-compaction, diff HK with inf-lat and apical HK, restrictive physio, mild AI, severe LAE, mild RVE with mild reduced RVSF, PASP 42 mmHg  . Hyperplastic rectal polyp   . OSA on CPAP  Mild  . Tobacco abuse    Remote    Past Surgical History:  Procedure Laterality Date  . CARDIAC CATHETERIZATION  2007;  2009  . CARDIAC DEFIBRILLATOR PLACEMENT  03/20/2017  . CORONARY ARTERY BYPASS GRAFT  2007   Had a left main dissection after catheterization. Underwent a saphenous vein graft to the LAD and a saphenous vein graft to obtuse marginal.  . ICD IMPLANT N/A 03/20/2017   Procedure: ICD Implant;  Surgeon: Constance Haw, MD;  Location: [place] [facility];  Service: Cardiovascular;  Laterality: N/A;  . PATELLAR TENDON REPAIR Right 2005    ROS:     Soreness on his left upper shoulder at the ICD site. Otherwise as stated in the HPI and negative for all other systems.  PHYSICAL EXAM BP (!) 100/54   Pulse 65   Ht 6'  1" (1.854 m)   Wt 253 lb (114.8 kg)   SpO2 97%   BMI 33.38 kg/m   GENERAL:  Well appearing NECK:  No jugular venous distention, waveform within normal limits, carotid upstroke brisk and symmetric, no bruits, no thyromegaly LUNGS:  Clear to auscultation bilaterally CHEST:  Healed ICD scar.   HEART:  PMI not displaced or sustained,S1 and S2 within normal limits, no S3, no S4, no clicks, no rubs, no murmurs ABD:  Flat, positive bowel sounds normal in frequency in pitch, no bruits, no rebound, no guarding, no midline pulsatile mass, no hepatomegaly, no splenomegaly EXT:  2 plus pulses throughout, no edema, no cyanosis no clubbing   EKG:  NA  ASSESSMENT AND PLAN:  CARDIOMYOPATHY:   His blood pressure will not allow med titration.  Actually think he is euvolemic.  He does have some shortness of breath occasionally and takes Lasix daily and could take an extra 20 mg for weight gain, shortness of breath or swelling.  No further imaging or med changes are suggested.  I am going to clarify that he is taking 12.5 mg of carvedilol.  He is not sure what dose he was taking but that is the dose I intended.   SLEEP APNEA:   I think uncontrolled sleep apnea is probably contributing to lots of his complaints.  He admits to not always wearing a mask and he does not think the settings are correct.  I am going to get him follow-up with Dr. Claiborne Billings.   HYPOTENSION: Low blood pressure will not allow med titration.  No change in therapy.

## 2017-11-17 NOTE — Telephone Encounter (Signed)
Spoke with pt about his blood work, and asked that he stop spironolactone and to recheck BMP in 1 week, BMP drawn and pt will come in next Monday.

## 2017-11-17 NOTE — Telephone Encounter (Signed)
-----   Message from Minus Breeding, MD sent at 11/17/2017  4:56 PM EST ----- Creat and potassium are increased.  Please call him and ask him to stop his spironolactone and to repeat a BMET next week.  Call Mr. Ulbrich with the results and send results to Darden Amber, [place]

## 2017-11-17 NOTE — Patient Instructions (Signed)
Medication Instructions:  TAKE- An extra 20 mg of Lasix for swelling, SOB and/or weight gain.  If you need a refill on your cardiac medications before your next appointment, please call your pharmacy.  Labwork: BMP Today HERE IN OUR OFFICE AT [facility]  Take the provided lab slips for you to take with you to the lab for you blood draw.   You will NOT need to fast   You may go to any [facility] lab that is convenient for you however, we do have a lab in our office that is able to assist you. You do NOT need an appointment for our lab. Once in our office lobby there is a podium to the right of the check-in desk where you are to sign-in and ring a doorbell to alert [place] you are here. Lab is open Monday-Friday from 8:00am to 4:00pm; and is closed for lunch from 12:45p-1:45pm   Testing/Procedures: None Ordered  Follow-Up: Your physician wants you to follow-up in: Next Available with Dr Claiborne Billings in sleep clinic. Your physician recommends that you schedule a follow-up appointment in: 3 Month with Dr [name]   Thank you for choosing [facility] at [facility]!!

## 2017-11-17 NOTE — Telephone Encounter (Signed)
Pt have appt today with Dr [name] at 10:00 am

## 2017-11-25 LAB — BASIC METABOLIC PANEL
BUN/Creatinine Ratio: 11 (ref 9–20)
BUN: 16 mg/dL (ref 6–24)
CO2: 27 mmol/L (ref 20–29)
Calcium: 10 mg/dL (ref 8.7–10.2)
Chloride: 104 mmol/L (ref 96–106)
Creatinine, Ser: 1.51 mg/dL — ABNORMAL HIGH (ref 0.76–1.27)
GFR calc Af Amer: 60 mL/min/{1.73_m2} (ref >59)
GFR calc non Af Amer: 52 mL/min/{1.73_m2} — ABNORMAL LOW (ref >59)
Glucose: 85 mg/dL (ref 65–99)
Potassium: 4.5 mmol/L (ref 3.5–5.2)
Sodium: 146 mmol/L — ABNORMAL HIGH (ref 134–144)

## 2017-12-09 NOTE — Progress Notes (Signed)
Electrophysiology Office Note   Date:  12/10/2017   ID:  Bruce Robertson, DOB 1961-05-11, MRN [id number]  PCP:  Darden Amber, PA  Cardiologist:  Bruce Robertson Primary Electrophysiologist:  [name] [name], MD    Chief Complaint  Patient presents with  . Defib Check    Nonischemic cardiomyopathy/Chronic systolic CHF  . Shortness of Breath     History of Present Illness: Bruce Robertson is a 57 y.o. male who is being seen today for the evaluation of nonischemic cardiomyopathy at the request of Bruce File, MD. Presenting today for electrophysiology evaluation. Repeat echo showed a stable ejection fraction of 25%. Had a Medtronic ICD implanted on 03/20/17.  Today, denies symptoms of palpitations, chest pain, shortness of breath, orthopnea, PND, lower extremity edema, claudication, dizziness, presyncope, syncope, bleeding, or neurologic sequela. The patient is tolerating medications without difficulties.  He feels well, though he has had some shortness of breath.  He is also had a chronic cough with thick white sputum.  This is been occurring over the last few weeks.  He also has some episodes of shortness of breath when he is sitting watching TV.  He has to take a deep breath when this occurs and it improves within the next few minutes.  Otherwise, he is able to exercise without issue.  He is able to walk for 2 hours in the park.   Past Medical History:  Diagnosis Date  . AICD (automatic cardioverter/defibrillator) present   . Anxiety   . Cardiomyopathy    Nonischemic. EF has been about 45%. S/P CABG.;  b.  Echo 4/14: EF 25%, global HK with inf and mid apical AK, restrictive physiology with E/e' > 15 (elevated LV filling pressure), trivial AI/MR, mild to mod LAE, mild RVE, mild reduced RVSF, mild RAE, mod TR, PASP 74 (severe pulmonary HTN)   . CHF (congestive heart failure) ([place])   . Colon polyps 2002  . Coronary artery disease   . Depression   . Dyslipidemia    hx (03/20/2017)    . GERD (gastroesophageal reflux disease)   . History of bleeding peptic ulcer   . History of echocardiogram    Echo 10/16:  EF 25-30%, poss non-compaction, diff HK with inf-lat and apical HK, restrictive physio, mild AI, severe LAE, mild RVE with mild reduced RVSF, PASP 42 mmHg  . Hyperplastic rectal polyp   . OSA on CPAP    Mild  . Tobacco abuse    Remote   Past Surgical History:  Procedure Laterality Date  . CARDIAC CATHETERIZATION  2007;  2009  . CARDIAC DEFIBRILLATOR PLACEMENT  03/20/2017  . CORONARY ARTERY BYPASS GRAFT  2007   Had a left main dissection after catheterization. Underwent a saphenous vein graft to the LAD and a saphenous vein graft to obtuse marginal.  . ICD IMPLANT N/A 03/20/2017   Procedure: ICD Implant;  Surgeon: Constance Haw, MD;  Location: [place] [facility];  Service: Cardiovascular;  Laterality: N/A;  . PATELLAR TENDON REPAIR Right 2005     Current Outpatient Medications  Medication Sig Dispense Refill  . aspirin EC 81 MG EC tablet Take 1 tablet (81 mg total) by mouth daily. 30 tablet 3  . carvedilol (COREG) 12.5 MG tablet TAKE ONE TABLET BY MOUTH TWICE DAILY WITH MEALS 180 tablet 2  . furosemide (LASIX) 20 MG tablet Take 20 mg by mouth daily.    Marland Kitchen losartan (COZAAR) 25 MG tablet TAKE ONE-HALF TABLET BY MOUTH ONCE DAILY 15 tablet 11  .  simvastatin (ZOCOR) 20 MG tablet TAKE ONE TABLET BY MOUTH AT BEDTIME 90 tablet 3  . vitamin B-12 (CYANOCOBALAMIN) 1000 MCG tablet Take 1,000 mcg by mouth daily.     No current facility-administered medications for this visit.     Allergies:   Patient has no known allergies.   Social History:  The patient  reports that he has quit smoking. His smoking use included cigarettes. He has a 0.70 pack-year smoking history. he has never used smokeless tobacco. He reports that he drinks alcohol. He reports that he uses drugs. Drug: Marijuana.   Family History:  The patient's family history includes Cancer in his other;  Heart failure in his father; Hypertension in his mother; Lung cancer in his other; Osteoarthritis in his mother.    ROS:  Please see the history of present illness.   Otherwise, review of systems is positive for cough, SOB.   All other systems are reviewed and negative.   PHYSICAL EXAM: VS:  BP 110/68   Pulse 65   Ht 6\' 1"  (1.854 m)   Wt 242 lb (109.8 kg)   BMI 31.93 kg/m  , BMI Body mass index is 31.93 kg/m. GEN: Well nourished, well developed, in no acute distress  HEENT: normal  Neck: no JVD, carotid bruits, or masses Cardiac: RRR; no murmurs, rubs, or gallops,no edema  Respiratory:  clear to auscultation bilaterally, normal work of breathing GI: soft, nontender, nondistended, + BS MS: no deformity or atrophy  Skin: warm and dry, device site well healed Neuro:  Strength and sensation are intact Psych: euthymic mood, full affect  EKG:  EKG is not ordered today. Personal review of the ekg ordered 06/24/17 shows SR, PVCs, rate 54  Personal review of the device interrogation today. Results in [place]: 03/20/2017: Hemoglobin 13.8; Platelets 150 11/24/2017: BUN 16; Creatinine, Ser 1.51; Potassium 4.5; Sodium 146    Lipid Panel     Component Value Date/Time   CHOL 200 08/01/2016 0453   TRIG 75 08/01/2016 0453   HDL 49 08/01/2016 0453   CHOLHDL 4.1 08/01/2016 0453   VLDL 15 08/01/2016 0453   LDLCALC 136 (H) 08/01/2016 0453   LDLDIRECT 166.4 05/17/2010 0905     Wt Readings from Last 3 Encounters:  12/10/17 242 lb (109.8 kg)  11/17/17 253 lb (114.8 kg)  06/24/17 250 lb 3.2 oz (113.5 kg)      Other studies Reviewed: Additional studies/ records that were reviewed today include: TTE 10/15/16  Review of the above records today demonstrates:  - Left ventricle: The cavity size was severely dilated. Wall   thickness was increased in a pattern of mild LVH. Systolic   function was severely reduced. The estimated ejection fraction   was in the range of 20% to  25%. Diffuse hypokinesis. There is   akinesis of the inferolateral and inferior myocardium. Doppler   parameters are consistent with restrictive physiology, indicative   of decreased left ventricular diastolic compliance and/or   increased left atrial pressure. - Aortic valve: There was trivial regurgitation. - Mitral valve: There was mild regurgitation. - Left atrium: The atrium was moderately to severely dilated. - Right atrium: The atrium was mildly dilated. - Pulmonary arteries: Systolic pressure was moderately increased.   PA peak pressure: 46 mm Hg (S).  ASSESSMENT AND PLAN:  1.  Nonischemic cardiomyopathy: On Coreg and losartan.  Medtronic single-chamber ICD implanted on 03/20/17.  Device functioning appropriately.  Optive all shows no increased volume overload.  No  changes.   changes at this time.  2. Sleep apnea: Compliant with CPAP  3.  Cough/shortness of breath: Patient does not feel that this is due to a sinus infection but lower down in his respiratory tract.  Fortunately, his optive all does not show evidence of volume overload.  This does not appear to be cardiac in nature.  I told him to contact his primary physician.   Current medicines are reviewed at length with the patient today.   The patient does not have concerns regarding his medicines.  The following changes were made today:  none  Labs/ tests ordered today include:  No orders of the defined types were placed in this encounter.    Disposition:   FU with Dereonna Lensing 12 months  Signed, Carrell Palmatier [name], MD  12/10/2017 10:38 AM     [facility] [address] [place] [address] [phone] (office) [phone] (fax)

## 2017-12-10 ENCOUNTER — Encounter: Payer: Self-pay | Admitting: Cardiology

## 2017-12-10 ENCOUNTER — Ambulatory Visit (INDEPENDENT_AMBULATORY_CARE_PROVIDER_SITE_OTHER): Payer: Self-pay | Admitting: Cardiology

## 2017-12-10 VITALS — BP 110/68 | HR 65 | Ht 73.0 in | Wt 242.0 lb

## 2017-12-10 DIAGNOSIS — I10 Essential (primary) hypertension: Secondary | ICD-10-CM

## 2017-12-10 DIAGNOSIS — I428 Other cardiomyopathies: Secondary | ICD-10-CM

## 2017-12-10 NOTE — Patient Instructions (Signed)
Medication Instructions:  Your physician recommends that you continue on your current medications as directed. Please refer to the Current Medication list given to you today.  *If you need a refill on your cardiac medications before your next appointment, please call your pharmacy*  Labwork: None ordered  Testing/Procedures: None ordered  Follow-Up: Remote monitoring is used to monitor your Pacemaker or ICD from home. This monitoring reduces the number of office visits required to check your device to one time per year. It allows [place] to keep an eye on the functioning of your device to ensure it is working properly. You are scheduled for a device check from home on 12/23/2017. You may send your transmission at any time that day. If you have a wireless device, the transmission will be sent automatically. After your physician reviews your transmission, you will receive a postcard with your next transmission date.  Your physician wants you to follow-up in: 1 year with Dr. Curt Bears.  You will receive a reminder letter in the mail two months in advance. If you don't receive a letter, please call our office to schedule the follow-up appointment.  Thank you for choosing [facility]!!   Trinidad Curet, RN [phone]

## 2017-12-23 ENCOUNTER — Ambulatory Visit (INDEPENDENT_AMBULATORY_CARE_PROVIDER_SITE_OTHER): Payer: Self-pay | Admitting: *Deleted

## 2017-12-23 DIAGNOSIS — I428 Other cardiomyopathies: Secondary | ICD-10-CM

## 2017-12-23 NOTE — Progress Notes (Signed)
Remote ICD transmission.   

## 2017-12-24 LAB — CUP PACEART REMOTE DEVICE CHECK
Battery Remaining Longevity: 133 mo
Battery Voltage: 3.04 V
Brady Statistic RV Percent Paced: 0.03 %
Date Time Interrogation Session: 20190218222237
HighPow Impedance: 62 Ohm
Implantable Lead Implant Date: 20180517
Implantable Lead Location: 753860
Implantable Pulse Generator Implant Date: 20180517
Lead Channel Impedance Value: 361 Ohm
Lead Channel Impedance Value: 456 Ohm
Lead Channel Pacing Threshold Amplitude: 0.75 V
Lead Channel Pacing Threshold Pulse Width: 0.4 ms
Lead Channel Sensing Intrinsic Amplitude: 11.25 mV
Lead Channel Sensing Intrinsic Amplitude: 11.25 mV
Lead Channel Setting Pacing Amplitude: 2.5 V
Lead Channel Setting Pacing Pulse Width: 0.4 ms
Lead Channel Setting Sensing Sensitivity: 0.3 mV

## 2017-12-25 ENCOUNTER — Encounter: Payer: Self-pay | Admitting: Cardiology

## 2017-12-29 ENCOUNTER — Ambulatory Visit: Payer: Medicaid Other | Admitting: Cardiology

## 2018-01-29 ENCOUNTER — Ambulatory Visit (INDEPENDENT_AMBULATORY_CARE_PROVIDER_SITE_OTHER): Payer: Self-pay | Admitting: Cardiovascular Disease

## 2018-01-29 ENCOUNTER — Encounter: Payer: Self-pay | Admitting: Cardiovascular Disease

## 2018-01-29 VITALS — BP 104/60 | HR 57 | Ht 73.0 in | Wt 243.0 lb

## 2018-01-29 DIAGNOSIS — I420 Dilated cardiomyopathy: Secondary | ICD-10-CM

## 2018-01-29 DIAGNOSIS — I5022 Chronic systolic (congestive) heart failure: Secondary | ICD-10-CM

## 2018-01-29 DIAGNOSIS — Z95810 Presence of automatic (implantable) cardiac defibrillator: Secondary | ICD-10-CM

## 2018-01-29 DIAGNOSIS — G4733 Obstructive sleep apnea (adult) (pediatric): Secondary | ICD-10-CM

## 2018-01-29 NOTE — Progress Notes (Signed)
Bruce Robertson   1961/05/07  [id number]  Primary Physician No PCP Per Patient Primary Cardiologist: Dr [name]; Charlcie Cradle)  HPI: Bruce Robertson is a 57 y.o. male who presents who has sleep evaluation.  I had seen him in September 2017 for initial evaluation following institution of CPAP therapy.  He is referred back to sleep clinic per Dr. [name].  Bruce Robertson has a history of a nonischemic cardiomyopathy.  He had issues with significant daytime sleepiness, fatigability, and snoring, and was referred for a sleep study which on 03/10/2016 in a split-night protocol.  This confirmed moderate obstructive sleep apnea overall with an AHI of 26.7.  However, the events were severe supine position with an AHI of 43.3.   Oxygen desaturated  to a nadir of 88%.  There was evidence for soft snoring.  He initially was titrated up to 9 cm water pressure.  His set up date was 05/06/2016.  A download from 06/03/2016 through 07/02/2016 demonstrated compliance with reference to days of usage being 77%.  However, he was noncompliant with usage greater than 4 hours, such that he had only 43% of days reaching this threshold.  His average usage on days used was 4 hours and 24 minutes.  His AHI was 6.3. When I saw him for initial evaluation he was still sleepy and at times did not feel as if he was getting adequate air.  With his positional component with severe sleep apnea I changed him to an auto modewith supine sleep which would allow for increased pressures with supine posture adjustment  When I initially saw him he still had residual daytime sleepiness as assessed by his Epworth sleepiness scale score of 11, which is outlined below.   Epworth Sleepiness Scale: Situation   Chance of Dozing/Sleeping (0 = never , 1 = slight chance , 2 = moderate chance , 3 = high chance )   sitting and reading 2   watching TV 2   sitting inactive in a public place 1   being a passenger in a motor vehicle for an hour or  more 2   lying down in the afternoon 3   sitting and talking to someone 0   sitting quietly after lunch (no alcohol) 1   while stopped for a few minutes in traffic as the driver 0   Total Score  11   I have not seen him since.  In the interim, he has developed worsening heart function and EF had dropped to 20 to 25%.  His medications were titrated and he underwent ICD implantation.  Apparently, he is only been intermittently using CPAP.  He is referred back to sleep clinic for further evaluation.  In the office today a new Epworth Sleepiness Scale score was calculated which endorsed at 16, as shown below.   Epworth Sleepiness Scale: Situation   Chance of Dozing/Sleeping (0 = never , 1 = slight chance , 2 = moderate chance , 3 = high chance )   sitting and reading 3   watching TV 3   sitting inactive in a public place 2   being a passenger in a motor vehicle for an hour or more 3   lying down in the afternoon 3   sitting and talking to someone 0   sitting quietly after lunch (no alcohol) 1   while stopped for a few minutes in traffic as the driver 1   Total Score  16   A new download was obtained in  the office today from December 29, 2017 through January 27, 2018.  He is noncompliant.  Usage days was only 16 out of 30 (53%) and usage greater than 4 hours was only 8 days (27%).  AHI is was 23 and is consistent with moderate sleep apnea.  Apnea index was 21.5 with an hypopnea index of 1.5.  Central apnea index was 10.9/h.    Past Medical History:  Diagnosis Date  . AICD (automatic cardioverter/defibrillator) present   . Anxiety   . Cardiomyopathy    Nonischemic. EF has been about 45%. S/P CABG.;  b.  Echo 4/14: EF 25%, global HK with inf and mid apical AK, restrictive physiology with E/e' > 15 (elevated LV filling pressure), trivial AI/MR, mild to mod LAE, mild RVE, mild reduced RVSF, mild RAE, mod TR, PASP 74 (severe pulmonary HTN)   . CHF (congestive heart failure) ([place])   . Colon  polyps 2002  . Coronary artery disease   . Depression   . Dyslipidemia    hx (03/20/2017)  . GERD (gastroesophageal reflux disease)   . History of bleeding peptic ulcer   . History of echocardiogram    Echo 10/16:  EF 25-30%, poss non-compaction, diff HK with inf-lat and apical HK, restrictive physio, mild AI, severe LAE, mild RVE with mild reduced RVSF, PASP 42 mmHg  . Hyperplastic rectal polyp   . OSA on CPAP    Mild  . Tobacco abuse    Remote    Past Surgical History:  Procedure Laterality Date  . CARDIAC CATHETERIZATION  2007;  2009  . CARDIAC DEFIBRILLATOR PLACEMENT  03/20/2017  . CORONARY ARTERY BYPASS GRAFT  2007   Had a left main dissection after catheterization. Underwent a saphenous vein graft to the LAD and a saphenous vein graft to obtuse marginal.  . ICD IMPLANT N/A 03/20/2017   Procedure: ICD Implant;  Surgeon: Constance Haw, MD;  Location: [facility];  Service: Cardiovascular;  Laterality: N/A;  . PATELLAR TENDON REPAIR Right 2005    No Known Allergies  Current Outpatient Medications  Medication Sig Dispense Refill  . aspirin EC 81 MG EC tablet Take 1 tablet (81 mg total) by mouth daily. 30 tablet 3  . carvedilol (COREG) 12.5 MG tablet TAKE ONE TABLET BY MOUTH TWICE DAILY WITH MEALS 180 tablet 2  . furosemide (LASIX) 20 MG tablet Take 20 mg by mouth daily.    . simvastatin (ZOCOR) 20 MG tablet TAKE ONE TABLET BY MOUTH AT BEDTIME 90 tablet 3  . spironolactone (ALDACTONE) 25 MG tablet Take 12.5 mg by mouth daily.    . vitamin B-12 (CYANOCOBALAMIN) 1000 MCG tablet Take 1,000 mcg by mouth daily.     No current facility-administered medications for this visit.     Social History   Socioeconomic History  . Marital status: Divorced    Spouse name: Not on file  . Number of children: Not on file  . Years of education: Not on file  . Highest education level: Not on file  Occupational History  . Occupation: Hydrologist: [employer]  . Financial resource strain: Not on file  . Food insecurity:    Worry: Not on file    Inability: Not on file  . Transportation needs:    Medical: Not on file    Non-medical: Not on file  Tobacco Use  . Smoking status: Former Smoker    Packs/day: 0.10    Years: 7.00  Pack years: 0.70    Types: Cigarettes  . Smokeless tobacco: Never Used  . Tobacco comment: 03/20/2017 "quit before 2000"  Substance and Sexual Activity  . Alcohol use: Yes    Alcohol/week: 0.0 oz    Comment: 03/20/2017 "might have 6 drinks/year @ the most"  . Drug use: Yes    Types: Marijuana    Comment: 03/20/2017 "joint qod"  . Sexual activity: Never  Lifestyle  . Physical activity:    Days per week: Not on file    Minutes per session: Not on file  . Stress: Not on file  Relationships  . Social connections:    Talks on phone: Not on file    Gets together: Not on file    Attends religious service: Not on file    Active member of club or organization: Not on file    Attends meetings of clubs or organizations: Not on file    Relationship status: Not on file  . Intimate partner violence:    Fear of current or ex partner: Not on file    Emotionally abused: Not on file    Physically abused: Not on file    Forced sexual activity: Not on file  Other Topics Concern  . Not on file  Social History Narrative   Single    Family History  Problem Relation Age of Onset  . Hypertension Mother   . Osteoarthritis Mother   . Heart failure Father   . Lung cancer Other   . Cancer Other      ROS General: Negative; No fevers, chills, or night sweats HEENT: Negative; No changes in vision or hearing, sinus congestion, difficulty swallowing Pulmonary: Negative; No cough, wheezing, shortness of breath, hemoptysis Cardiovascular: Positive for a nonischemic cardiomyopathy.  No chest pain. GI: Negative; No nausea, vomiting, diarrhea, or abdominal pain GU: Negative; No dysuria, hematuria, or  difficulty voiding Musculoskeletal: Negative; no myalgias, joint pain, or weakness Hematologic: Negative; no easy bruising, bleeding Endocrine: Negative; no heat/cold intolerance Neuro: Negative; no changes in balance, headaches Skin: Negative; No rashes or skin lesions Psychiatric: Negative; No behavioral problems, depression Sleep: Positive for sleep apnea as noted above with daytime sleepiness, hypersomnolence; no bruxism, restless legs, hypnogognic hallucinations, no cataplexy   Physical Exam BP 104/60 (BP Location: Left Arm, Patient Position: Sitting, Cuff Size: Large)   Pulse (!) 57   Ht 6' 1"  (1.854 m)   Wt 243 lb (110.2 kg)   BMI 32.06 kg/m    Repeat blood pressure by me 104/64  Wt Readings from Last 3 Encounters:  01/29/18 243 lb (110.2 kg)  12/10/17 242 lb (109.8 kg)  11/17/17 253 lb (114.8 kg)    General: Alert, oriented, no distress.  Skin: normal turgor, no rashes, warm and dry HEENT: Normocephalic, atraumatic. Pupils equal round and reactive to light; sclera anicteric; extraocular muscles intact; Nose without nasal septal hypertrophy Mouth/Parynx benign; Mallinpatti scale 3 Neck: No JVD, no carotid bruits; normal carotid upstroke Lungs: clear to ausculatation and percussion; no wheezing or rales Chest wall: without tenderness to palpitation Heart: PMI not displaced, RRR, s1 s2 normal, 1/6 systolic murmur, no diastolic murmur, no rubs, gallops, thrills, or heaves Abdomen: soft, nontender; no hepatosplenomehaly, BS+; abdominal aorta nontender and not dilated by palpation. Back: no CVA tenderness Pulses 2+ Musculoskeletal: full range of motion, normal strength, no joint deformities Extremities: no clubbing cyanosis or edema, Homan's sign negative  Neurologic: grossly nonfocal; Cranial nerves grossly wnl Psychologic: Normal mood and affect   ECG (independently  read by me): sinus bradycardia 57 bpm.  PVC.  Low voltage.  QTc interval 451 ms.  September 2017 ECG  (independently read by me): Not done, but his ECG from 02/19/2016 was reviewed and reveals sinus bradycardia at 56, left posterior hemiblock, lateral T-wave changes, and small Q waves in lead 3 and aVF.  LABS:  BMP Latest Ref Rng & Units 11/24/2017 11/17/2017 03/20/2017  Glucose 65 - 99 mg/dL 85 116(H) 107(H)  BUN 6 - 24 mg/dL 16 16 17   Creatinine 0.76 - 1.27 mg/dL 1.51(H) 1.65(H) 1.29(H)  BUN/Creat Ratio 9 - 20 11 10  -  Sodium 134 - 144 mmol/L 146(H) 143 137  Potassium 3.5 - 5.2 mmol/L 4.5 5.3(H) 3.8  Chloride 96 - 106 mmol/L 104 101 104  CO2 20 - 29 mmol/L 27 28 23   Calcium 8.7 - 10.2 mg/dL 10.0 10.5(H) 8.8(L)     Hepatic Function Latest Ref Rng & Units 08/01/2016 04/29/2015 05/17/2010  Total Protein 6.5 - 8.1 g/dL 6.8 6.5 7.0  Albumin 3.5 - 5.0 g/dL 3.5 3.8 4.1  AST 15 - 41 U/L 20 30 34  ALT 17 - 63 U/L 25 28 28   Alk Phosphatase 38 - 126 U/L 46 40 41  Total Bilirubin 0.3 - 1.2 mg/dL 1.5(H) 0.3 0.8  Bilirubin, Direct 0.0 - 0.3 mg/dL - - 0.1     CBC Latest Ref Rng & Units 03/20/2017 07/31/2016 08/11/2015  WBC 4.0 - 10.5 K/uL 4.8 8.9 4.9  Hemoglobin 13.0 - 17.0 g/dL 13.8 13.4 14.1  Hematocrit 39.0 - 52.0 % 42.0 42.7 43.4  Platelets 150 - 400 K/uL 150 140(L) 173     Lipid Panel     Component Value Date/Time   CHOL 200 08/01/2016 0453   TRIG 75 08/01/2016 0453   HDL 49 08/01/2016 0453   CHOLHDL 4.1 08/01/2016 0453   VLDL 15 08/01/2016 0453   LDLCALC 136 (H) 08/01/2016 0453   LDLDIRECT 166.4 05/17/2010 0905     RADIOLOGY: No results found.  IMPRESSION:  1. OSA (obstructive sleep apnea)   2. Nonischemic cardiomyopathy ([place])   3. Chronic systolic congestive heart failure ([place])   4. S/P ICD (internal cardiac defibrillator) procedure    ASSESSMENT AND PLAN: Bruce Robertson is a 57 year old African-American male who has a history of a cardiomyopathy.  In the past his ejection fraction was in the 40-45% range, but this has further reduced to approximately 25%.  In 2017  he was diagnosed with  moder a dilated common ately severe sleep apnea with an overall AHI of 26.7.  However, there was a significant positional component with supine sleep AHI at 43.3 per hour, consistent with severe sleep apnea with supine posture.  A download at that time demonstrated  an AHI of 6.3 at 9 cm water pressure.  I adjusted him to an auto mode due to the severity of his sleep apnea with supine sleep and the need for increased pressure requirements.  He is now status post ICD implantation for his significant LV dysfunction.  With reduced LV function if volume status is elevated, he may be developing central apneic events in addition to his obstructive events.  His most recent download now shows an AHI of 23 with a minimum pressure of 8 and maximum up to 20 cm water.  His 95th percentile pressure is 16 with a maximum average pressure at 17.  Initially, I will increase his minimum pressure up to 12 cm water pressure since he he may be having  significant events at low pressures and this should be overcome with starting at 12 cm.  However, if he continues to experience central events, he will need to undergo BiPAP titration.  He has an old F&P Simplus mask.  I will write a prescription for a new ResMed full facemask.  I again discussed the importance of utilizing CPAP therapy for the entire duration of sleep.  He tells me he goes to bed between 11 PM and midnight and often wakes up at 630.  As result he should be using CPAP for at least 7 hours of sleep per night.  I also discussed that the preponderance of REM sleep occurs in the second half of night and sleep apnea is typically worse during rem sleep.  I also discussed with him data regarding ICD discharges nocturnally if sleep apnea is untreated.  A new download will be obtained with his new changes and I will see him for follow-up evaluation.  With his high pressure requirement and if central events continue, BiPAP titration will be necessary.   Time  spent: 25 minutes  Troy Sine, MD, [facility]  01/29/2018 12:13 PM

## 2018-01-29 NOTE — Patient Instructions (Addendum)
Follow-Up: 03/12/18 at 11:40 with Dr. Claiborne Billings

## 2018-01-31 ENCOUNTER — Encounter: Payer: Self-pay | Admitting: Cardiovascular Disease

## 2018-02-03 ENCOUNTER — Telehealth: Payer: Self-pay | Admitting: Cardiovascular Disease

## 2018-02-03 NOTE — Telephone Encounter (Signed)
New Message:    Pt states he is calling about a cpap mask for his machine. He spoke with someone last week while in the office.

## 2018-02-03 NOTE — Telephone Encounter (Signed)
Spoke with [facility] @ [facility]. She has patient's order and will call and speak with him.

## 2018-02-08 NOTE — Progress Notes (Signed)
HPI The patient presents for followup of his cardiomyopathy. Years ago he had an ejection fraction of 40%. 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 He did end up requiring bypass because during his diagnostic catheterization he had dissection of his left main vessel. His ejection fraction eventually improved to about 45% with medications. The etiology was thought probably to be hypertensive.  Subsequent echocardiogram demonstrates his EF now to be about 25%.  I repeated an echo recently and the EF was still 20 - 25%.  He has been followed in the HF clinic. Meds have been titrated slightly but his blood pressure precludes significant movement.   He is now status post ICD implant.  At the last visit he had increased edema.    He continues to have some shortness of breath.  He says he will just be sitting there and he will feel like he cannot get his breath.  He sleeps chronically on 4 pillows.  He did drop his sat to 94% from 99 with ambulation.  We checked this in the office today.  He has some white sputum production but is not describing frothy pink sputum.  He is not describing classic PND or orthopnea.  Is not had any fevers or chills.  He has not had chest pressure, neck or arm discomfort.  He has had no palpitations, presyncope or syncope.    No Known Allergies  Current Outpatient Medications  Medication Sig Dispense Refill  . aspirin EC 81 MG EC tablet Take 1 tablet (81 mg total) by mouth daily. 30 tablet 3  . carvedilol (COREG) 12.5 MG tablet TAKE ONE TABLET BY MOUTH TWICE DAILY WITH MEALS 180 tablet 2  . furosemide (LASIX) 20 MG tablet Take 20 mg by mouth daily.    . simvastatin (ZOCOR) 20 MG tablet TAKE ONE TABLET BY MOUTH AT BEDTIME 90 tablet 3  . spironolactone (ALDACTONE) 25 MG tablet Take 12.5 mg by mouth daily.    . vitamin B-12 (CYANOCOBALAMIN) 1000 MCG tablet Take 1,000 mcg by mouth daily.     No current facility-administered medications for this visit.     Past Medical History:   Diagnosis Date  . AICD (automatic cardioverter/defibrillator) present   . Anxiety   . Cardiomyopathy    Nonischemic. EF has been about 45%. S/P CABG.;  b.  Echo 4/14: EF 25%, global HK with inf and mid apical AK, restrictive physiology with E/e' > 15 (elevated LV filling pressure), trivial AI/MR, mild to mod LAE, mild RVE, mild reduced RVSF, mild RAE, mod TR, PASP 74 (severe pulmonary HTN)   . CHF (congestive heart failure) ([place])   . Colon polyps 2002  . Coronary artery disease   . Depression   . Dyslipidemia    hx (03/20/2017)  . GERD (gastroesophageal reflux disease)   . History of bleeding peptic ulcer   . History of echocardiogram    Echo 10/16:  EF 25-30%, poss non-compaction, diff HK with inf-lat and apical HK, restrictive physio, mild AI, severe LAE, mild RVE with mild reduced RVSF, PASP 42 mmHg  . Hyperplastic rectal polyp   . OSA on CPAP    Mild  . Tobacco abuse    Remote    Past Surgical History:  Procedure Laterality Date  . CARDIAC CATHETERIZATION  2007;  2009  . CARDIAC DEFIBRILLATOR PLACEMENT  03/20/2017  . CORONARY ARTERY BYPASS GRAFT  2007   Had a left main dissection after catheterization. Underwent a saphenous vein graft to the LAD  and a saphenous vein graft to obtuse marginal.  . ICD IMPLANT N/A 03/20/2017   Procedure: ICD Implant;  Surgeon: Constance Haw, MD;  Location: [facility];  Service: Cardiovascular;  Laterality: N/A;  . PATELLAR TENDON REPAIR Right 2005    ROS:     As stated in the HPI and negative for all other systems.  PHYSICAL EXAM BP 104/66   Pulse (!) 49   Ht 6\' 1"  (1.854 m)   Wt 251 lb 9.6 oz (114.1 kg)   BMI 33.19 kg/m   GENERAL:  Well appearing NECK:  No jugular venous distention, waveform within normal limits, carotid upstroke brisk and symmetric, no bruits, no thyromegaly LUNGS:  Clear to auscultation bilaterally CHEST:  Well healed ICD scar.  HEART:  PMI not displaced or sustained,S1 and S2 within normal limits, no  S3, no S4, no clicks, no rubs, no murmurs ABD:  Flat, positive bowel sounds normal in frequency in pitch, no bruits, no rebound, no guarding, no midline pulsatile mass, no hepatomegaly, no splenomegaly EXT:  2 plus pulses throughout, no edema, no cyanosis no clubbing   EKG:  NA   ASSESSMENT AND PLAN:  CARDIOMYOPATHY:    He continues to have some shortness of breath which is a little bit difficult to quantify and qualify by his description.  It does not sound like classic PND.  He did not have any evidence of volume overload with his thoracic impedance been evaluated recently.  He does not seem to be volume overloaded by exam.  I am going to check a BNP and chest x-ray.  If there is no evidence of volume overload corroborating his exam which suggests U bulimia but he still complains of shortness of breath and I might need to send him for pulmonary evaluation.   SLEEP APNEA:    He just had his CPAP device changed and he feels better with sleeping will see if he is less tired and short of breath with this as well.  HYPOTENSION:   Blood pressure runs slightly low and this precludes any med titration.  He will continue the meds as listed.   ICD: I did follow-up his device and he has normal function.  He had some nonsustained V. tach 9 beats but otherwise no device firing.

## 2018-02-09 ENCOUNTER — Encounter: Payer: Self-pay | Admitting: Cardiology

## 2018-02-09 ENCOUNTER — Ambulatory Visit (INDEPENDENT_AMBULATORY_CARE_PROVIDER_SITE_OTHER): Payer: Self-pay | Admitting: Cardiology

## 2018-02-09 VITALS — BP 104/66 | HR 49 | Ht 73.0 in | Wt 251.6 lb

## 2018-02-09 DIAGNOSIS — G4733 Obstructive sleep apnea (adult) (pediatric): Secondary | ICD-10-CM

## 2018-02-09 DIAGNOSIS — R0602 Shortness of breath: Secondary | ICD-10-CM

## 2018-02-09 DIAGNOSIS — I952 Hypotension due to drugs: Secondary | ICD-10-CM | POA: Insufficient documentation

## 2018-02-09 DIAGNOSIS — I428 Other cardiomyopathies: Secondary | ICD-10-CM

## 2018-02-09 NOTE — Patient Instructions (Signed)
Medication Instructions:  Continue current medications  If you need a refill on your cardiac medications before your next appointment, please call your pharmacy.  Labwork: BNP HERE IN OUR OFFICE AT [facility]  Take the provided lab slips for you to take with you to the lab for you blood draw.   You will NOT need to fast   Testing/Procedures: A chest x-ray takes a picture of the organs and structures inside the chest, including the heart, lungs, and blood vessels. This test can show several things, including, whether the heart is enlarges; whether fluid is building up in the lungs; and whether pacemaker / defibrillator leads are still in place.   Follow-Up: Your physician wants you to follow-up in: 3 Months.     Thank you for choosing [facility] at [facility]!!

## 2018-02-10 LAB — BRAIN NATRIURETIC PEPTIDE: BNP: 287.6 pg/mL — ABNORMAL HIGH (ref 0.0–100.0)

## 2018-02-16 ENCOUNTER — Ambulatory Visit
Admission: RE | Admit: 2018-02-16 | Discharge: 2018-02-16 | Disposition: A | Discharge status: Home / Self Care | Payer: Medicaid Other | Source: Ambulatory Visit | Attending: Cardiology | Admitting: Cardiology

## 2018-02-16 DIAGNOSIS — R0602 Shortness of breath: Secondary | ICD-10-CM

## 2018-02-20 ENCOUNTER — Other Ambulatory Visit: Payer: Self-pay | Admitting: Cardiology

## 2018-03-04 ENCOUNTER — Telehealth: Payer: Self-pay | Admitting: *Deleted

## 2018-03-04 ENCOUNTER — Other Ambulatory Visit: Payer: Self-pay | Admitting: *Deleted

## 2018-03-04 DIAGNOSIS — G4733 Obstructive sleep apnea (adult) (pediatric): Secondary | ICD-10-CM

## 2018-03-04 NOTE — Telephone Encounter (Signed)
Patient notified that despite the CPAP presure change and new mask his AHI's are still high. Dr Claiborne Billings recommends he has a BIPAP titration. Patient voiced understanding and agrees with the plan . Appointment for BIPAP titration scheduled for 03/10/18.

## 2018-03-10 ENCOUNTER — Ambulatory Visit (HOSPITAL_BASED_OUTPATIENT_CLINIC_OR_DEPARTMENT_OTHER): Payer: Medicare Other | Attending: Cardiovascular Disease | Admitting: Cardiovascular Disease

## 2018-03-10 VITALS — Ht 73.0 in | Wt 240.0 lb

## 2018-03-10 DIAGNOSIS — G4731 Primary central sleep apnea: Secondary | ICD-10-CM | POA: Diagnosis not present

## 2018-03-10 DIAGNOSIS — G4733 Obstructive sleep apnea (adult) (pediatric): Secondary | ICD-10-CM | POA: Diagnosis not present

## 2018-03-10 DIAGNOSIS — Z7982 Long term (current) use of aspirin: Secondary | ICD-10-CM | POA: Diagnosis not present

## 2018-03-10 DIAGNOSIS — Z79899 Other long term (current) drug therapy: Secondary | ICD-10-CM | POA: Diagnosis not present

## 2018-03-12 ENCOUNTER — Ambulatory Visit: Payer: Self-pay | Admitting: Cardiovascular Disease

## 2018-03-16 ENCOUNTER — Ambulatory Visit (INDEPENDENT_AMBULATORY_CARE_PROVIDER_SITE_OTHER): Payer: Medicare Other | Admitting: Cardiovascular Disease

## 2018-03-16 ENCOUNTER — Encounter: Payer: Self-pay | Admitting: Cardiovascular Disease

## 2018-03-16 VITALS — BP 106/58 | HR 50 | Ht 73.0 in | Wt 245.6 lb

## 2018-03-16 DIAGNOSIS — I5022 Chronic systolic (congestive) heart failure: Secondary | ICD-10-CM | POA: Diagnosis not present

## 2018-03-16 DIAGNOSIS — Z95810 Presence of automatic (implantable) cardiac defibrillator: Secondary | ICD-10-CM | POA: Diagnosis not present

## 2018-03-16 DIAGNOSIS — I428 Other cardiomyopathies: Secondary | ICD-10-CM | POA: Diagnosis not present

## 2018-03-16 NOTE — Patient Instructions (Signed)
Medication Instructions:  Your physician recommends that you continue on your current medications as directed. Please refer to the Current Medication list given to you today.  Testing/Procedures: Your physician has requested that you have an echocardiogram. Echocardiography is a painless test that uses sound waves to create images of your heart. It provides your doctor with information about the size and shape of your heart and how well your heart's chambers and valves are working. This procedure takes approximately one hour. There are no restrictions for this procedure.  This will be done at our [facility] location:  [place]: 3 months with Dr. Claiborne Billings  Any Other Special Instructions Will Be Listed Below (If Applicable).     If you need a refill on your cardiac medications before your next appointment, please call your pharmacy.

## 2018-03-16 NOTE — Progress Notes (Deleted)
Bruce Robertson   1961/08/31  [id number]  Primary Physician No PCP Per Patient Primary Cardiologist: Dr [name]  HPI: Bruce Robertson is a 57 y.o. male who presents to sleep clinic after recent initiation of CPAP therapy for obstructive sleep apnea.  Bruce Robertson has a history of a nonischemic cardiomyopathy.  He had issues with significant daytime sleepiness, fatigability, and snoring, and was referred for a sleep study which was done on 03/10/2016 and a split-night protocol.  This confirmed moderate obstructive sleep apnea overall with an HI of 26.7.  However, the events were severe supine position with an AHI of 43.3.  He reduce his oxygen to a nadir of 88%.  There was evidence for soft snoring.  He initially was titrated up to 9 cm water pressure.  His set up date was 05/06/2016.  A download from 06/03/2016 through 07/02/2016 shows that he is meeting compliance with reference to days of usage being 77%.  However, he is noncompliant with usage greater than 4 hours, such that he had only 43% of days reaching this threshold.  His average usage on days used was 4 hours and 24 minutes.  His AHI was 6.3.  Upon further questioning, he feels that he is still not getting enough air.  He does have a positional component to his sleep apnea and I suspect with supine sleep.  He will require greater pressures.  He still has residual daytime sleepiness as assessed by his Epworth sleepiness scale score of 11, which is outlined below.   Epworth Sleepiness Scale: Situation   Chance of Dozing/Sleeping (0 = never , 1 = slight chance , 2 = moderate chance , 3 = high chance )   sitting and reading 2   watching TV 2   sitting inactive in a public place 1   being a passenger in a motor vehicle for an hour or more 2   lying down in the afternoon 3   sitting and talking to someone 0   sitting quietly after lunch (no alcohol) 1   while stopped for a few minutes in traffic as the driver 0   Total Score  11      Past Medical History:  Diagnosis Date  . AICD (automatic cardioverter/defibrillator) present   . Anxiety   . Cardiomyopathy    Nonischemic. EF has been about 45%. S/P CABG.;  b.  Echo 4/14: EF 25%, global HK with inf and mid apical AK, restrictive physiology with E/e' > 15 (elevated LV filling pressure), trivial AI/MR, mild to mod LAE, mild RVE, mild reduced RVSF, mild RAE, mod TR, PASP 74 (severe pulmonary HTN)   . CHF (congestive heart failure) ([place])   . Colon polyps 2002  . Coronary artery disease   . Depression   . Dyslipidemia    hx (03/20/2017)  . GERD (gastroesophageal reflux disease)   . History of bleeding peptic ulcer   . History of echocardiogram    Echo 10/16:  EF 25-30%, poss non-compaction, diff HK with inf-lat and apical HK, restrictive physio, mild AI, severe LAE, mild RVE with mild reduced RVSF, PASP 42 mmHg  . Hyperplastic rectal polyp   . OSA on CPAP    Mild  . Tobacco abuse    Remote    Past Surgical History:  Procedure Laterality Date  . CARDIAC CATHETERIZATION  2007;  2009  . CARDIAC DEFIBRILLATOR PLACEMENT  03/20/2017  . CORONARY ARTERY BYPASS GRAFT  2007   Had a left main dissection  after catheterization. Underwent a saphenous vein graft to the LAD and a saphenous vein graft to obtuse marginal.  . ICD IMPLANT N/A 03/20/2017   Procedure: ICD Implant;  Surgeon: Constance Haw, MD;  Location: [facility];  Service: Cardiovascular;  Laterality: N/A;  . PATELLAR TENDON REPAIR Right 2005    No Known Allergies  Current Outpatient Medications  Medication Sig Dispense Refill  . aspirin EC 81 MG EC tablet Take 1 tablet (81 mg total) by mouth daily. 30 tablet 3  . carvedilol (COREG) 12.5 MG tablet TAKE ONE TABLET BY MOUTH TWICE DAILY WITH MEALS 180 tablet 2  . furosemide (LASIX) 20 MG tablet Take 20 mg by mouth daily.    . furosemide (LASIX) 40 MG tablet Take 0.5 tablets (20 mg total) by mouth daily. 45 tablet 3  . simvastatin (ZOCOR) 20 MG  tablet TAKE ONE TABLET BY MOUTH AT BEDTIME 90 tablet 3  . spironolactone (ALDACTONE) 25 MG tablet Take 12.5 mg by mouth daily.    . vitamin B-12 (CYANOCOBALAMIN) 1000 MCG tablet Take 1,000 mcg by mouth daily.     No current facility-administered medications for this visit.     Social History   Socioeconomic History  . Marital status: Divorced    Spouse name: Not on file  . Number of children: Not on file  . Years of education: Not on file  . Highest education level: Not on file  Occupational History  . Occupation: Hydrologist: [employer]  . Financial resource strain: Not on file  . Food insecurity:    Worry: Not on file    Inability: Not on file  . Transportation needs:    Medical: Not on file    Non-medical: Not on file  Tobacco Use  . Smoking status: Former Smoker    Packs/day: 0.10    Years: 7.00    Pack years: 0.70    Types: Cigarettes  . Smokeless tobacco: Never Used  . Tobacco comment: 03/20/2017 "quit before 2000"  Substance and Sexual Activity  . Alcohol use: Yes    Alcohol/week: 0.0 oz    Comment: 03/20/2017 "might have 6 drinks/year @ the most"  . Drug use: Yes    Types: Marijuana    Comment: 03/20/2017 "joint qod"  . Sexual activity: Never  Lifestyle  . Physical activity:    Days per week: Not on file    Minutes per session: Not on file  . Stress: Not on file  Relationships  . Social connections:    Talks on phone: Not on file    Gets together: Not on file    Attends religious service: Not on file    Active member of club or organization: Not on file    Attends meetings of clubs or organizations: Not on file    Relationship status: Not on file  . Intimate partner violence:    Fear of current or ex partner: Not on file    Emotionally abused: Not on file    Physically abused: Not on file    Forced sexual activity: Not on file  Other Topics Concern  . Not on file  Social History Narrative   Single    Family  History  Problem Relation Age of Onset  . Hypertension Mother   . Osteoarthritis Mother   . Heart failure Father   . Lung cancer Other   . Cancer Other      ROS General: Negative;  No fevers, chills, or night sweats HEENT: Negative; No changes in vision or hearing, sinus congestion, difficulty swallowing Pulmonary: Negative; No cough, wheezing, shortness of breath, hemoptysis Cardiovascular: Positive for a nonischemic cardiomyopathy.  No chest pain. GI: Negative; No nausea, vomiting, diarrhea, or abdominal pain GU: Negative; No dysuria, hematuria, or difficulty voiding Musculoskeletal: Negative; no myalgias, joint pain, or weakness Hematologic: Negative; no easy bruising, bleeding Endocrine: Negative; no heat/cold intolerance Neuro: Negative; no changes in balance, headaches Skin: Negative; No rashes or skin lesions Psychiatric: Negative; No behavioral problems, depression Sleep: Positive for sleep apnea as noted above with daytime sleepiness, hypersomnolence; no bruxism, restless legs, hypnogognic hallucinations, no cataplexy   Physical Exam BP (!) 106/58   Pulse (!) 50   Ht 6' 1" (1.854 m)   Wt 245 lb 9.6 oz (111.4 kg)   BMI 32.40 kg/m    Repeat blood pressure by me was 98/60 both in the supine and standing positions.  Wt Readings from Last 3 Encounters:  03/16/18 245 lb 9.6 oz (111.4 kg)  03/10/18 240 lb (108.9 kg)  02/09/18 251 lb 9.6 oz (114.1 kg)    Physical Exam BP (!) 106/58   Pulse (!) 50   Ht 6' 1" (1.854 m)   Wt 245 lb 9.6 oz (111.4 kg)   BMI 32.40 kg/m  General: Alert, oriented, no distress.  Skin: normal turgor, no rashes, warm and dry HEENT: Normocephalic, atraumatic. Pupils equal round and reactive to light; sclera anicteric; extraocular muscles intact; Fundi ** Nose without nasal septal hypertrophy Mouth/Parynx benign; Mallinpatti scale Neck: No JVD, no carotid bruits; normal carotid upstroke Lungs: clear to ausculatation and percussion; no  wheezing or rales Chest wall: without tenderness to palpitation Heart: PMI not displaced, RRR, s1 s2 normal, 1/6 systolic murmur, no diastolic murmur, no rubs, gallops, thrills, or heaves Abdomen: soft, nontender; no hepatosplenomehaly, BS+; abdominal aorta nontender and not dilated by palpation. Back: no CVA tenderness Pulses 2+ Musculoskeletal: full range of motion, normal strength, no joint deformities Extremities: no clubbing cyanosis or edema, Homan's sign negative  Neurologic: grossly nonfocal; Cranial nerves grossly wnl Psychologic: Normal mood and affect    General: Alert, oriented, no distress.  Skin: normal turgor, no rashes HEENT: Normocephalic, atraumatic. Pupils round and reactive; sclera anicteric; extraocular muscles intact; Fundi Is flat.  No hemorrhages or exudates. Nose without nasal septal hypertrophy Mouth/Parynx benign; Mallinpatti scale 3 Neck: No JVD, no carotid bruits Lungs: clear to ausculatation and percussion; no wheezing or rales  Chest wall: No tenderness to palpation Heart: RRR, s1 s2 normal; no S3 gallop.  No rubs thrills or heaves  Abdomen: soft, nontender; no hepatosplenomehaly, BS+; abdominal aorta nontender and not dilated by palpation. Back: No CVA tenderness Pulses 2+ Extremities: no clubbinbg cyanosis or edema, Homan's sign negative  Neurologic: grossly nonfocal; cranial nerves intact. Psychological: Normal affect and mood.  ECG (independently read by me): sinus bradycardia 50 bpm.  PR interval 162 ms QTc interval 424 ms  September 2017 ECG (independently read by me): Not done today, but his ECG from 02/19/2016 was reviewed and reveals sinus bradycardia at 56, left posterior hemiblock, lateral T-wave changes, and small Q waves in lead 3 and aVF.  LABS:  BMP Latest Ref Rng & Units 11/24/2017 11/17/2017 03/20/2017  Glucose 65 - 99 mg/dL 85 116(H) 107(H)  BUN 6 - 24 mg/dL _0 Creatinine 0.76 - 1.27 mg/dL 1.51(H) 1.65(H) 1.29(H)  BUN/Creat  Ratio 9 - _1 -  Sodium 134 - 144  mmol/L 146(H) 143 137  Potassium 3.5 - 5.2 mmol/L 4.5 5.3(H) 3.8  Chloride 96 - 106 mmol/L 104 101 104  CO2 20 - 29 mmol/L _0 Calcium 8.7 - 10.2 mg/dL 10.0 10.5(H) 8.8(L)     Hepatic Function Latest Ref Rng & Units 08/01/2016 04/29/2015 05/17/2010  Total Protein 6.5 - 8.1 g/dL 6.8 6.5 7.0  Albumin 3.5 - 5.0 g/dL 3.5 3.8 4.1  AST 15 - 41 U/L 20 30 34  ALT 17 - 63 U/L _1 Alk Phosphatase 38 - 126 U/L 46 40 41  Total Bilirubin 0.3 - 1.2 mg/dL 1.5(H) 0.3 0.8  Bilirubin, Direct 0.0 - 0.3 mg/dL - - 0.1     CBC Latest Ref Rng & Units 03/20/2017 07/31/2016 08/11/2015  WBC 4.0 - 10.5 K/uL 4.8 8.9 4.9  Hemoglobin 13.0 - 17.0 g/dL 13.8 13.4 14.1  Hematocrit 39.0 - 52.0 % 42.0 42.7 43.4  Platelets 150 - 400 K/uL 150 140(L) 173     Lipid Panel     Component Value Date/Time   CHOL 200 08/01/2016 0453   TRIG 75 08/01/2016 0453   HDL 49 08/01/2016 0453   CHOLHDL 4.1 08/01/2016 0453   VLDL 15 08/01/2016 0453   LDLCALC 136 (H) 08/01/2016 0453   LDLDIRECT 166.4 05/17/2010 0905     RADIOLOGY: Dg Chest 2 View  Result Date: 02/16/2018 CLINICAL DATA:  Shortness of breath for 2 months EXAM: CHEST - 2 VIEW COMPARISON:  03/21/17 FINDINGS: Defibrillator is again noted and stable. Cardiac shadow is stable. Postsurgical changes are seen. The lungs are well aerated bilaterally. No focal infiltrate is noted. No acute bony abnormality is seen. IMPRESSION: No active cardiopulmonary disease. Electronically Signed   By: Inez Catalina M.D.   On: 02/16/2018 14:00   IMPRESSION:  1. Nonischemic cardiomyopathy ([place])   2. Chronic systolic congestive heart failure ([place])   3. S/P ICD (internal cardiac defibrillator) procedure      ASSESSMENT AND PLAN: Bruce Robertson is a 57 year old African-American male who has a history of a nonischemic cardiomyopathy.  In the past his ejection fraction was in the 40-45% range, but this has further reduced to  approximately 25%.  He has had issues with low blood pressure, leading to reduction of some of his medications.  He was referred for a sleep study, which I reviewed with him in detail today.  This confirms moderately severe sleep apnea with an overall AHI of 26.7.  However, there was a significant positional component with supine sleep AHI at 43.3 per hour, consistent with severe sleep apnea with supine posture.  His download reveals an AHI of 6.3 at 9 cm water pressure.  When he is on his back he does not feel that he is getting adequate air.  Due to this positional component  I have adjusted his CPAP unit and  changed him to an auto mode with a minimum pressure of 8 and a maximum pressure of 20.  He is using a fullface mask.  I discussed with him the importance of meeting compliance standards and although he is compliant with reference to use it days.  He is not compliant with usage greater than 4 hours.  I discussed with him normal sleep architecture and discussed with him that the preponderance of REM sleep occurs  in the second half of night and it is essential that he uses his CPAP and worse his mask for the nights duration.  I discussed adverse consequences associated  with untreated sleep apnea particularly with reference to cardiovascular implications.  A new download will be obtained in 30 days, which hopefully will document compliance and show improvement in his AHI with the auto mode.  He is sleeping for adequate duration and typically goes to bed between 10 and 11 and wakes up at around 6 AM.  We will contact him following results of his additional download and further recommendations will be made at that time.  Time spent: 25 minutes  Troy Sine, MD, [facility]  03/16/2018 6:36 PM

## 2018-03-18 ENCOUNTER — Encounter: Payer: Self-pay | Admitting: Cardiovascular Disease

## 2018-03-18 ENCOUNTER — Encounter (HOSPITAL_BASED_OUTPATIENT_CLINIC_OR_DEPARTMENT_OTHER): Payer: Self-pay | Admitting: Cardiovascular Disease

## 2018-03-18 NOTE — Procedures (Signed)
Patient Name: Bruce Robertson, Bruce Robertson Date: 03/10/2018 Gender: Male D.O.B: 03-May-1961 Age (years): 67 Referring Provider: Shelva Majestic MD, ABSM Height (inches): 71 Interpreting Physician: Shelva Majestic MD, ABSM Weight (lbs): 244 RPSGT: Lanae Boast BMI: 34 MRN: [id number] Neck Size: 18.00  CLINICAL INFORMATION The patient is referred for a BiPAP titration to treat sleep apnea  Date of Split Night: 03/10/2016: AHI 26.7/h, RDI 33.5/h; supine sleep, AHI 43.3/h  SLEEP STUDY TECHNIQUE As per the AASM Manual for the Scoring of Sleep and Associated Events v2.3 (April 2016) with a hypopnea requiring 4% desaturations.  The channels recorded and monitored were frontal, central and occipital EEG, electrooculogram (EOG), submentalis EMG (chin), nasal and oral airflow, thoracic and abdominal wall motion, anterior tibialis EMG, snore microphone, electrocardiogram, and pulse oximetry. Bilevel positive airway pressure (BPAP) was initiated at the beginning of the study and titrated to treat sleep-disordered breathing.  MEDICATIONS     aspirin EC 81 MG EC tablet         carvedilol (COREG) 12.5 MG tablet         furosemide (LASIX) 20 MG tablet         furosemide (LASIX) 40 MG tablet         simvastatin (ZOCOR) 20 MG tablet         spironolactone (ALDACTONE) 25 MG tablet         vitamin B-12 (CYANOCOBALAMIN) 1000 MCG tablet     Medications self-administered by patient taken the night of the study : N/A  RESPIRATORY PARAMETERS Optimal IPAP Pressure (cm):  AHI at Optimal Pressure (/hr) N/A Optimal EPAP Pressure (cm):  Overall Minimal O2 (%): 91.0 Minimal O2 at Optimal Pressure (%): 91.0  SLEEP ARCHITECTURE Start Time: 10:22:53 PM Stop Time: 5:36:44 AM Total Time (min): 433.9 Total Sleep Time (min): 346 Sleep Latency (min): 6.9 Sleep Efficiency (%): 79.7% REM Latency (min): 47.0 WASO (min): 81.0 Stage N1 (%): 32.7% Stage N2 (%): 54.6% Stage N3 (%): 0.0% Stage R (%): 12.72 Supine  (%): 54.76 Arousal Index (/hr): 34.2     CARDIAC DATA The 2 lead EKG demonstrated sinus rhythm. The mean heart rate was 49.2 beats per minute. Other EKG findings include: PVCs.  LEG MOVEMENT DATA The total Periodic Limb Movements of Sleep (PLMS) were 0. The PLMS index was 0.0. A PLMS index of <15 is considered normal in adults.  IMPRESSIONS - CPAP was initiated at 8/4 and was titrated up to 17/13.  Frequent central events developed, particularly at pressures greater than 12/8.  At 10/6  AHI was 0 and RDI 10.7. At 12/8 AHI was 7.7, with an RDI of 23.1  An optimal PAP pressure could not be selected for this patient based on the available study data. - Moderate Central Sleep Apnea was noted during this titration (CAI = 17.7/h). - Significant oxygen desaturations were not observed during this titration (min O2 = 91.0%). - The patient snored with moderate snoring volume. - 2-lead EKG demonstrated: PVCs - Clinically significant periodic limb movements were not noted during this study. Arousals associated with PLMs were rare.  DIAGNOSIS - Obstructive Sleep Apnea (327.23 [G47.33 ICD-10]) - Central sleep apnea  RECOMMENDATIONS - The patient will be followed up in sleep clinic.  He has a history of severe LV dysfunction and may not be a candidate for ASV titration.  Recommend follow-up echo Doppler study for reassessment of LV function and potential candidacy for ASV.  If LV dysfunction remains poor consider BiPAP Auto up to 12/8 centimeters water.  -  Efforts should be made to optimize nasal or pharyngeal patency. - Avoid alcohol, sedatives and other CNS depressants that may worsen sleep apnea and disrupt normal sleep architecture. - Sleep hygiene should be reviewed to assess factors that may improve sleep quality. - Weight management and regular exercise should be initiated or continued. - Return to Sleep Center for re-evaluation after 4 weeks of therapy  [Electronically signed] 03/18/2018 07:37  PM  Shelva Majestic MD, [facility], ABSM Diplomate, American Board of Sleep Medicine   NPI: [id number] [place] PH: [phone]   FX: [phone] [place]

## 2018-03-18 NOTE — Progress Notes (Signed)
Bruce Robertson   1961-10-29  [id number]  Primary Physician No PCP Per Patient Primary Cardiologist: Dr [name]; Charlcie Cradle)  HPI: Bruce Robertson is a 57 y.o. male who presents who has sleep evaluation.  I had seen him in September 2017 for initial evaluation following institution of CPAP therapy.  He was referred back to sleep clinic per Dr. [name] , and I saw him on 01/29/2018.  He completed a BiPAP titration last week and was added onto my schedule to be seen today.  [name] has a history of a nonischemic cardiomyopathy.  He had issues with significant daytime sleepiness, fatigability, and snoring, and was referred for a sleep study which on 03/10/2016 in a split-night protocol.  This confirmed moderate obstructive sleep apnea overall with an AHI of 26.7.  However, the events were severe supine position with an AHI of 43.3.   Oxygen desaturated  to a nadir of 88%.  There was evidence for soft snoring.  He initially was titrated up to 9 cm water pressure.  His set up date was 05/06/2016.  A download from 06/03/2016 through 07/02/2016 demonstrated compliance with reference to days of usage being 77%.  However, he was noncompliant with usage greater than 4 hours, such that he had only 43% of days reaching this threshold.  His average usage on days used was 4 hours and 24 minutes.  His AHI was 6.3. When I saw him for initial evaluation he was still sleepy and at times did not feel as if he was getting adequate air.  With his positional component with severe sleep apnea I changed him to an auto modewith supine sleep which would allow for increased pressures with supine posture adjustment  When I initially saw him he still had residual daytime sleepiness as assessed by his Epworth sleepiness scale score of 11, which is outlined below.   Epworth Sleepiness Scale: Situation   Chance of Dozing/Sleeping (0 = never , 1 = slight chance , 2 = moderate chance , 3 = high chance )   sitting and  reading 2   watching TV 2   sitting inactive in a public place 1   being a passenger in a motor vehicle for an hour or more 2   lying down in the afternoon 3   sitting and talking to someone 0   sitting quietly after lunch (no alcohol) 1   while stopped for a few minutes in traffic as the driver 0   Total Score  11   I have not seen him since.  In the interim, he has developed worsening heart function and EF had dropped to 20 to 25%.  His medications were titrated and he underwent ICD implantation.  Apparently, he is only been intermittently using CPAP.  He is referred back to sleep clinic for further evaluation.  In the office today a new Epworth Sleepiness Scale score was calculated which endorsed at 16, as shown below.   Epworth Sleepiness Scale: Situation   Chance of Dozing/Sleeping (0 = never , 1 = slight chance , 2 = moderate chance , 3 = high chance )   sitting and reading 3   watching TV 3   sitting inactive in a public place 2   being a passenger in a motor vehicle for an hour or more 3   lying down in the afternoon 3   sitting and talking to someone 0   sitting quietly after lunch (no alcohol) 1   while  stopped for a few minutes in traffic as the driver 1   Total Score  16   A new download was obtained in the office today from December 29, 2017 through January 27, 2018.  He was noncompliant.  Usage days was only 16 out of 30 (53%) and usage greater than 4 hours was only 8 days (27%).  AHI is was 23 and is consistent with moderate sleep apnea.  Apnea index was 21.5 with an hypopnea index of 1.5.  Central apnea index was 10.9/h. When I saw him on 01/29/2018 for evaluation, had a long discussion with him regarding the importance of therapy.  I was concerned that with his reduced LV function.  If his volume status is elevated, he may be developing central apneic events.  In addition to obstructive events.  He underwent a BiPAP titration study on 03/10/2018.  Sleep efficiency was 79.7%.   This was a suboptimal titration.  BiPAP was implemented at 8 over 4, and he was titrated up to 17/13.  At low level pressures, he had rare central events, but as bilevel pressure increased above 12/8, there was significant increase in the number of central apneic events.  His AHI at 10/6 was 0 with an RDI of 10.7, but he only had 11 minutes of sleep.  At 12/8.  AHI was 7.7, RDI 23.2.  He did not have any central events and had 4 red hours.  Oxygen saturation was 93 and 94% during non-REM and REM  Sleep. AHI was significantly worse at higher BiPAP pressures. He presents for evaluation.  Past Medical History:  Diagnosis Date  . AICD (automatic cardioverter/defibrillator) present   . Anxiety   . Cardiomyopathy    Nonischemic. EF has been about 45%. S/P CABG.;  b.  Echo 4/14: EF 25%, global HK with inf and mid apical AK, restrictive physiology with E/e' > 15 (elevated LV filling pressure), trivial AI/MR, mild to mod LAE, mild RVE, mild reduced RVSF, mild RAE, mod TR, PASP 74 (severe pulmonary HTN)   . CHF (congestive heart failure) ([place])   . Colon polyps 2002  . Coronary artery disease   . Depression   . Dyslipidemia    hx (03/20/2017)  . GERD (gastroesophageal reflux disease)   . History of bleeding peptic ulcer   . History of echocardiogram    Echo 10/16:  EF 25-30%, poss non-compaction, diff HK with inf-lat and apical HK, restrictive physio, mild AI, severe LAE, mild RVE with mild reduced RVSF, PASP 42 mmHg  . Hyperplastic rectal polyp   . OSA on CPAP    Mild  . Tobacco abuse    Remote    Past Surgical History:  Procedure Laterality Date  . CARDIAC CATHETERIZATION  2007;  2009  . CARDIAC DEFIBRILLATOR PLACEMENT  03/20/2017  . CORONARY ARTERY BYPASS GRAFT  2007   Had a left main dissection after catheterization. Underwent a saphenous vein graft to the LAD and a saphenous vein graft to obtuse marginal.  . ICD IMPLANT N/A 03/20/2017   Procedure: ICD Implant;  Surgeon: Constance Haw,  MD;  Location: [facility];  Service: Cardiovascular;  Laterality: N/A;  . PATELLAR TENDON REPAIR Right 2005    No Known Allergies  Current Outpatient Medications  Medication Sig Dispense Refill  . aspirin EC 81 MG EC tablet Take 1 tablet (81 mg total) by mouth daily. 30 tablet 3  . carvedilol (COREG) 12.5 MG tablet TAKE ONE TABLET BY MOUTH TWICE DAILY WITH MEALS  180 tablet 2  . furosemide (LASIX) 20 MG tablet Take 20 mg by mouth daily.    . furosemide (LASIX) 40 MG tablet Take 0.5 tablets (20 mg total) by mouth daily. 45 tablet 3  . simvastatin (ZOCOR) 20 MG tablet TAKE ONE TABLET BY MOUTH AT BEDTIME 90 tablet 3  . spironolactone (ALDACTONE) 25 MG tablet Take 12.5 mg by mouth daily.    . vitamin B-12 (CYANOCOBALAMIN) 1000 MCG tablet Take 1,000 mcg by mouth daily.     No current facility-administered medications for this visit.     Social History   Socioeconomic History  . Marital status: Divorced    Spouse name: Not on file  . Number of children: Not on file  . Years of education: Not on file  . Highest education level: Not on file  Occupational History  . Occupation: Hydrologist: [employer]  . Financial resource strain: Not on file  . Food insecurity:    Worry: Not on file    Inability: Not on file  . Transportation needs:    Medical: Not on file    Non-medical: Not on file  Tobacco Use  . Smoking status: Former Smoker    Packs/day: 0.10    Years: 7.00    Pack years: 0.70    Types: Cigarettes  . Smokeless tobacco: Never Used  . Tobacco comment: 03/20/2017 "quit before 2000"  Substance and Sexual Activity  . Alcohol use: Yes    Alcohol/week: 0.0 oz    Comment: 03/20/2017 "might have 6 drinks/year @ the most"  . Drug use: Yes    Types: Marijuana    Comment: 03/20/2017 "joint qod"  . Sexual activity: Never  Lifestyle  . Physical activity:    Days per week: Not on file    Minutes per session: Not on file  . Stress: Not on  file  Relationships  . Social connections:    Talks on phone: Not on file    Gets together: Not on file    Attends religious service: Not on file    Active member of club or organization: Not on file    Attends meetings of clubs or organizations: Not on file    Relationship status: Not on file  . Intimate partner violence:    Fear of current or ex partner: Not on file    Emotionally abused: Not on file    Physically abused: Not on file    Forced sexual activity: Not on file  Other Topics Concern  . Not on file  Social History Narrative   Single    Family History  Problem Relation Age of Onset  . Hypertension Mother   . Osteoarthritis Mother   . Heart failure Father   . Lung cancer Other   . Cancer Other      ROS General: Negative; No fevers, chills, or night sweats HEENT: Negative; No changes in vision or hearing, sinus congestion, difficulty swallowing Pulmonary: Negative; No cough, wheezing, shortness of breath, hemoptysis Cardiovascular: Positive for a nonischemic cardiomyopathy.  No chest pain. GI: Negative; No nausea, vomiting, diarrhea, or abdominal pain GU: Negative; No dysuria, hematuria, or difficulty voiding Musculoskeletal: Negative; no myalgias, joint pain, or weakness Hematologic: Negative; no easy bruising, bleeding Endocrine: Negative; no heat/cold intolerance Neuro: Negative; no changes in balance, headaches Skin: Negative; No rashes or skin lesions Psychiatric: Negative; No behavioral problems, depression Sleep: Positive for sleep apnea as noted above with daytime sleepiness, hypersomnolence; no  bruxism, restless legs, hypnogognic hallucinations, no cataplexy   Physical Exam BP (!) 106/58   Pulse (!) 50   Ht 6' 1"  (1.854 m)   Wt 245 lb 9.6 oz (111.4 kg)   BMI 32.40 kg/m    Repeat blood pressure by me was 98/64 supine and 92/60 standing.  Wt Readings from Last 3 Encounters:  03/16/18 245 lb 9.6 oz (111.4 kg)  03/10/18 240 lb (108.9 kg)    02/09/18 251 lb 9.6 oz (114.1 kg)   General: Alert, oriented, no distress.  Skin: normal turgor, no rashes, warm and dry HEENT: Normocephalic, atraumatic. Pupils equal round and reactive to light; sclera anicteric; extraocular muscles intact;  Nose without nasal septal hypertrophy Mouth/Parynx benign; Mallinpatti scale 3 Neck: No JVD, no carotid bruits; normal carotid upstroke Lungs: clear to ausculatation and percussion; no wheezing or rales Chest wall: without tenderness to palpitation Heart: PMI not displaced, RRR, s1 s2 normal, 1/6 systolic murmur, no diastolic murmur, no rubs, gallops, thrills, or heaves Abdomen: soft, nontender; no hepatosplenomehaly, BS+; abdominal aorta nontender and not dilated by palpation. Back: no CVA tenderness Pulses 2+ Musculoskeletal: full range of motion, normal strength, no joint deformities Extremities: no clubbing cyanosis or edema, Homan's sign negative  Neurologic: grossly nonfocal; Cranial nerves grossly wnl Psychologic: Normal mood and affect   ECG (independently read by me): sinus bradycardia 57 bpm.  PVC.  Low voltage.  QTc interval 451 ms.  September 2017 ECG (independently read by me): Not done, but his ECG from 02/19/2016 was reviewed and reveals sinus bradycardia at 56, left posterior hemiblock, lateral T-wave changes, and small Q waves in lead 3 and aVF.  LABS:  BMP Latest Ref Rng & Units 11/24/2017 11/17/2017 03/20/2017  Glucose 65 - 99 mg/dL 85 116(H) 107(H)  BUN 6 - 24 mg/dL 16 16 17   Creatinine 0.76 - 1.27 mg/dL 1.51(H) 1.65(H) 1.29(H)  BUN/Creat Ratio 9 - 20 11 10  -  Sodium 134 - 144 mmol/L 146(H) 143 137  Potassium 3.5 - 5.2 mmol/L 4.5 5.3(H) 3.8  Chloride 96 - 106 mmol/L 104 101 104  CO2 20 - 29 mmol/L 27 28 23   Calcium 8.7 - 10.2 mg/dL 10.0 10.5(H) 8.8(L)     Hepatic Function Latest Ref Rng & Units 08/01/2016 04/29/2015 05/17/2010  Total Protein 6.5 - 8.1 g/dL 6.8 6.5 7.0  Albumin 3.5 - 5.0 g/dL 3.5 3.8 4.1  AST 15 - 41 U/L  20 30 34  ALT 17 - 63 U/L 25 28 28   Alk Phosphatase 38 - 126 U/L 46 40 41  Total Bilirubin 0.3 - 1.2 mg/dL 1.5(H) 0.3 0.8  Bilirubin, Direct 0.0 - 0.3 mg/dL - - 0.1     CBC Latest Ref Rng & Units 03/20/2017 07/31/2016 08/11/2015  WBC 4.0 - 10.5 K/uL 4.8 8.9 4.9  Hemoglobin 13.0 - 17.0 g/dL 13.8 13.4 14.1  Hematocrit 39.0 - 52.0 % 42.0 42.7 43.4  Platelets 150 - 400 K/uL 150 140(L) 173     Lipid Panel     Component Value Date/Time   CHOL 200 08/01/2016 0453   TRIG 75 08/01/2016 0453   HDL 49 08/01/2016 0453   CHOLHDL 4.1 08/01/2016 0453   VLDL 15 08/01/2016 0453   LDLCALC 136 (H) 08/01/2016 0453   LDLDIRECT 166.4 05/17/2010 0905     RADIOLOGY: No results found.  IMPRESSION:  1. Nonischemic cardiomyopathy ([place])   2. Chronic systolic congestive heart failure ([place])   3. S/P ICD (internal cardiac defibrillator) procedure    ASSESSMENT  AND PLAN: Mr. Kaesen Rodriguez is a 57 year old African-American male who has a history of a cardiomyopathy.  In the past his ejection fraction was in the 40-45% range, but this has further reduced to approximately 25%.  In 2017 he was diagnosed with  moderately severe sleep apnea with an overall AHI of 26.7.  However, there was a significant positional component with supine sleep AHI at 43.3 per hour, consistent with severe sleep apnea with supine posture.  A download at that time demonstrated  an AHI of 6.3 at 9 cm water pressure.  I adjusted him to an auto mode due to the severity of his sleep apnea with supine sleep and the need for increased pressure requirements.  He is now status post ICD implantation for his significant LV dysfunction.  When I saw him I was concerned that with his LV dysfunction he may be also experiencing central events.on numerous times I try to read his study but on some to wear the study continue to "fail to open.  ".  However I did have data regarding its cord report.  Subsequently, I have personally reviewed the BiPAP  sonogram.  He clearly has frequent central events which seem to increase at BiPAP pressures greater than 12/8.  Ideally with his central events he would be a candidate for ASV titration.  However with his severe LV dysfunction that previously has been demonstrated based on most recent recommendations which used old equipment this should not be prescribed in patients with significant LV dysfunction.  I have recommended he undergo a follow-up echo Doppler study for reassessment.  Currently new clinical trials are underway with more advanced auto pressure support adjustments which may actually have favorable benefit.  As result I will defer ordering his new equipment until the results of the echo study are done.  Presently his blood pressure is low which precludes initiation of Entresto or other agents for optimization of treatment.  I will contact him regarding his echo results and decisions of treatment.  I again discussed the importance of adequate sleep duration, that the preponderance of rem sleep occurs in the second half of night and sleep apnea is typically worse during rem sleep and also discussed with him regarding ICD discharges nocturnally if sleep apnea is untreated.  I will see him for follow-up evaluation in 3 months.  Time spent: [place], MD, [facility]  03/18/2018 7:47 PM

## 2018-03-24 ENCOUNTER — Telehealth: Payer: Self-pay | Admitting: Cardiology

## 2018-03-24 ENCOUNTER — Ambulatory Visit (INDEPENDENT_AMBULATORY_CARE_PROVIDER_SITE_OTHER): Payer: Medicare Other | Admitting: *Deleted

## 2018-03-24 DIAGNOSIS — I428 Other cardiomyopathies: Secondary | ICD-10-CM

## 2018-03-24 NOTE — Telephone Encounter (Signed)
LMOVM reminding pt to send remote transmission.   

## 2018-03-25 ENCOUNTER — Other Ambulatory Visit (HOSPITAL_COMMUNITY): Payer: Medicare Other

## 2018-03-25 NOTE — Progress Notes (Signed)
Remote ICD transmission.   

## 2018-03-26 ENCOUNTER — Encounter: Payer: Self-pay | Admitting: Cardiology

## 2018-04-01 ENCOUNTER — Ambulatory Visit (HOSPITAL_COMMUNITY): Payer: Medicare Other | Attending: Cardiovascular Disease

## 2018-04-01 ENCOUNTER — Other Ambulatory Visit: Payer: Self-pay

## 2018-04-01 DIAGNOSIS — G4730 Sleep apnea, unspecified: Secondary | ICD-10-CM | POA: Diagnosis not present

## 2018-04-01 DIAGNOSIS — E785 Hyperlipidemia, unspecified: Secondary | ICD-10-CM | POA: Diagnosis not present

## 2018-04-01 DIAGNOSIS — E669 Obesity, unspecified: Secondary | ICD-10-CM | POA: Insufficient documentation

## 2018-04-01 DIAGNOSIS — Z87891 Personal history of nicotine dependence: Secondary | ICD-10-CM | POA: Insufficient documentation

## 2018-04-01 DIAGNOSIS — I428 Other cardiomyopathies: Secondary | ICD-10-CM | POA: Insufficient documentation

## 2018-04-01 DIAGNOSIS — Z6832 Body mass index (BMI) 32.0-32.9, adult: Secondary | ICD-10-CM | POA: Insufficient documentation

## 2018-04-01 DIAGNOSIS — N183 Chronic kidney disease, stage 3 (moderate): Secondary | ICD-10-CM | POA: Diagnosis not present

## 2018-04-01 DIAGNOSIS — I361 Nonrheumatic tricuspid (valve) insufficiency: Secondary | ICD-10-CM | POA: Insufficient documentation

## 2018-04-01 DIAGNOSIS — I420 Dilated cardiomyopathy: Secondary | ICD-10-CM | POA: Diagnosis not present

## 2018-04-01 DIAGNOSIS — I130 Hypertensive heart and chronic kidney disease with heart failure and stage 1 through stage 4 chronic kidney disease, or unspecified chronic kidney disease: Secondary | ICD-10-CM | POA: Diagnosis not present

## 2018-04-01 DIAGNOSIS — I5022 Chronic systolic (congestive) heart failure: Secondary | ICD-10-CM | POA: Diagnosis not present

## 2018-04-03 LAB — CUP PACEART REMOTE DEVICE CHECK
Battery Remaining Longevity: 131 mo
Battery Voltage: 3.01 V
Brady Statistic RV Percent Paced: 0.44 %
Date Time Interrogation Session: 20190521203936
HighPow Impedance: 65 Ohm
Implantable Lead Implant Date: 20180517
Implantable Lead Location: 753860
Implantable Pulse Generator Implant Date: 20180517
Lead Channel Impedance Value: 361 Ohm
Lead Channel Impedance Value: 456 Ohm
Lead Channel Pacing Threshold Amplitude: 0.625 V
Lead Channel Pacing Threshold Pulse Width: 0.4 ms
Lead Channel Sensing Intrinsic Amplitude: 11.375 mV
Lead Channel Sensing Intrinsic Amplitude: 11.375 mV
Lead Channel Setting Pacing Amplitude: 2.5 V
Lead Channel Setting Pacing Pulse Width: 0.4 ms
Lead Channel Setting Sensing Sensitivity: 0.3 mV

## 2018-04-14 ENCOUNTER — Telehealth: Payer: Self-pay | Admitting: *Deleted

## 2018-04-14 NOTE — Telephone Encounter (Signed)
Faxed BIPAP order to [facility].

## 2018-05-16 NOTE — Progress Notes (Signed)
HPI The patient presents for followup of his cardiomyopathy. Years ago he had an ejection fraction of 40%. 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 He did end up requiring bypass because during his diagnostic catheterization he had dissection of his left main vessel. His ejection fraction eventually improved to about 45% with medications. The etiology was thought probably to be hypertensive.  Subsequent echocardiogram demonstrates his EF now to be about 25%.  I repeated an echo recently and the EF was still 20 - 25%.  He has been followed in the HF clinic. Meds have been titrated slightly but his blood pressure precludes significant movement.   He is status post ICD implant.  Since I last saw him he had follow up with Dr. Claiborne Billings and management of his CPAP.  I reviewed his most recent ICD remote follow up report for this visit.  At the last visit his BNP was mildly elevated but his CXR was without edema.  He continues to have fatigue and DOE.    Since I last saw him he has been exercising more and has lost about 10 pounds.  He is feeling better.  He is less fatigued.  He has less shortness of breath.  He denies any palpitations, presyncope or syncope.  He is not having any chest pressure, neck or arm discomfort.   No Known Allergies  Current Outpatient Medications  Medication Sig Dispense Refill  . aspirin EC 81 MG EC tablet Take 1 tablet (81 mg total) by mouth daily. 30 tablet 3  . carvedilol (COREG) 12.5 MG tablet TAKE ONE TABLET BY MOUTH TWICE DAILY WITH MEALS 180 tablet 2  . furosemide (LASIX) 40 MG tablet Take 40 mg by mouth.    . simvastatin (ZOCOR) 20 MG tablet TAKE ONE TABLET BY MOUTH AT BEDTIME 90 tablet 3  . spironolactone (ALDACTONE) 25 MG tablet Take 12.5 mg by mouth daily.    . vitamin B-12 (CYANOCOBALAMIN) 1000 MCG tablet Take 1,000 mcg by mouth daily.     No current facility-administered medications for this visit.     Past Medical History:  Diagnosis Date  . AICD (automatic  cardioverter/defibrillator) present   . Anxiety   . Cardiomyopathy    Nonischemic. EF has been about 45%. S/P CABG.;  b.  Echo 4/14: EF 25%, global HK with inf and mid apical AK, restrictive physiology with E/e' > 15 (elevated LV filling pressure), trivial AI/MR, mild to mod LAE, mild RVE, mild reduced RVSF, mild RAE, mod TR, PASP 74 (severe pulmonary HTN)   . CHF (congestive heart failure) ([place])   . Colon polyps 2002  . Coronary artery disease   . Depression   . Dyslipidemia    hx (03/20/2017)  . GERD (gastroesophageal reflux disease)   . History of bleeding peptic ulcer   . History of echocardiogram    Echo 10/16:  EF 25-30%, poss non-compaction, diff HK with inf-lat and apical HK, restrictive physio, mild AI, severe LAE, mild RVE with mild reduced RVSF, PASP 42 mmHg  . Hyperplastic rectal polyp   . OSA on CPAP    Mild  . Tobacco abuse    Remote    Past Surgical History:  Procedure Laterality Date  . CARDIAC CATHETERIZATION  2007;  2009  . CARDIAC DEFIBRILLATOR PLACEMENT  03/20/2017  . CORONARY ARTERY BYPASS GRAFT  2007   Had a left main dissection after catheterization. Underwent a saphenous vein graft to the LAD and a saphenous vein graft to obtuse marginal.  .  ICD IMPLANT N/A 03/20/2017   Procedure: ICD Implant;  Surgeon: Constance Haw, MD;  Location: [facility];  Service: Cardiovascular;  Laterality: N/A;  . PATELLAR TENDON REPAIR Right 2005    ROS:     As stated in the HPI and negative for all other systems.  PHYSICAL EXAM BP (!) 122/58   Pulse (!) 50   Ht 6\' 1"  (1.854 m)   Wt 236 lb (107 kg)   SpO2 98%   BMI 31.14 kg/m   GENERAL:  Well appearing NECK:  No jugular venous distention, waveform within normal limits, carotid upstroke brisk and symmetric, no bruits, no thyromegaly LUNGS:  Clear to auscultation bilaterally CHEST:  Unremarkable HEART:  PMI not displaced or sustained,S1 and S2 within normal limits, no S3, no S4, no clicks, no rubs, no  murmurs ABD:  Flat, positive bowel sounds normal in frequency in pitch, no bruits, no rebound, no guarding, no midline pulsatile mass, no hepatomegaly, no splenomegaly EXT:  2 plus pulses throughout, no edema, no cyanosis no clubbing    EKG:  NA   ASSESSMENT AND PLAN:  CARDIOMYOPATHY:     At this time he appears to be euvolemic on exam.  We had difficulty titrating his meds in the past important because he is drifted away from care or run out of medications or not tolerated them because of low blood pressures.  He is been very compliant recently and I am going to try to restart lisinopril 2.5 mg daily.  I would like to try to continue to titrate.  I will consider further evaluation with possibly PYP study in the future.     SLEEP APNEA:  We had him seen by our sleep team today to discuss a new mask.    HYPOTENSION:     I will try med titration as above.   ICD: I did follow-up his device and he has normal function.   I reviewed recent tracings for this visit.

## 2018-05-18 ENCOUNTER — Encounter: Payer: Self-pay | Admitting: Cardiology

## 2018-05-18 ENCOUNTER — Ambulatory Visit (INDEPENDENT_AMBULATORY_CARE_PROVIDER_SITE_OTHER): Payer: Medicare Other | Admitting: Cardiology

## 2018-05-18 VITALS — BP 122/58 | HR 50 | Ht 73.0 in | Wt 236.0 lb

## 2018-05-18 DIAGNOSIS — I5022 Chronic systolic (congestive) heart failure: Secondary | ICD-10-CM

## 2018-05-18 DIAGNOSIS — G4730 Sleep apnea, unspecified: Secondary | ICD-10-CM

## 2018-05-18 MED ORDER — LISINOPRIL 2.5 MG PO TABS: 2.5000 mg | ORAL_TABLET | Freq: Every day | ORAL | 3 refills | Status: DC

## 2018-05-18 NOTE — Patient Instructions (Addendum)
Medication Instructions:  START- Lisinopril 2.5 mg daily  If you need a refill on your cardiac medications before your next appointment, please call your pharmacy.  Labwork: None Ordered   Testing/Procedures: None Ordered  Follow-Up: Your physician wants you to follow-up in: 1 Months with Kerin Ransom.      Thank you for choosing [facility] at [facility]!!

## 2018-06-09 ENCOUNTER — Encounter (HOSPITAL_COMMUNITY): Payer: Self-pay | Admitting: Emergency Medicine

## 2018-06-09 ENCOUNTER — Emergency Department (HOSPITAL_COMMUNITY): Payer: Medicare Other

## 2018-06-09 ENCOUNTER — Other Ambulatory Visit: Payer: Self-pay

## 2018-06-09 ENCOUNTER — Inpatient Hospital Stay (HOSPITAL_COMMUNITY)
Admission: EM | Admit: 2018-06-09 | Discharge: 2018-06-10 | DRG: 291 | Disposition: A | Discharge status: Home / Self Care | Payer: Medicare Other | Attending: Family Medicine | Admitting: Family Medicine

## 2018-06-09 DIAGNOSIS — N183 Chronic kidney disease, stage 3 unspecified: Secondary | ICD-10-CM | POA: Diagnosis present

## 2018-06-09 DIAGNOSIS — R0609 Other forms of dyspnea: Secondary | ICD-10-CM

## 2018-06-09 DIAGNOSIS — R112 Nausea with vomiting, unspecified: Secondary | ICD-10-CM | POA: Diagnosis not present

## 2018-06-09 DIAGNOSIS — E785 Hyperlipidemia, unspecified: Secondary | ICD-10-CM | POA: Diagnosis present

## 2018-06-09 DIAGNOSIS — Z86010 Personal history of colonic polyps: Secondary | ICD-10-CM | POA: Diagnosis not present

## 2018-06-09 DIAGNOSIS — G4733 Obstructive sleep apnea (adult) (pediatric): Secondary | ICD-10-CM | POA: Diagnosis not present

## 2018-06-09 DIAGNOSIS — I2510 Atherosclerotic heart disease of native coronary artery without angina pectoris: Secondary | ICD-10-CM | POA: Diagnosis present

## 2018-06-09 DIAGNOSIS — I472 Ventricular tachycardia: Secondary | ICD-10-CM | POA: Diagnosis not present

## 2018-06-09 DIAGNOSIS — Z7982 Long term (current) use of aspirin: Secondary | ICD-10-CM

## 2018-06-09 DIAGNOSIS — R1032 Left lower quadrant pain: Secondary | ICD-10-CM | POA: Diagnosis present

## 2018-06-09 DIAGNOSIS — Z9111 Patient's noncompliance with dietary regimen: Secondary | ICD-10-CM

## 2018-06-09 DIAGNOSIS — Z95810 Presence of automatic (implantable) cardiac defibrillator: Secondary | ICD-10-CM | POA: Diagnosis present

## 2018-06-09 DIAGNOSIS — I5043 Acute on chronic combined systolic (congestive) and diastolic (congestive) heart failure: Secondary | ICD-10-CM | POA: Diagnosis not present

## 2018-06-09 DIAGNOSIS — F1290 Cannabis use, unspecified, uncomplicated: Secondary | ICD-10-CM | POA: Diagnosis present

## 2018-06-09 DIAGNOSIS — I130 Hypertensive heart and chronic kidney disease with heart failure and stage 1 through stage 4 chronic kidney disease, or unspecified chronic kidney disease: Secondary | ICD-10-CM | POA: Diagnosis not present

## 2018-06-09 DIAGNOSIS — R7303 Prediabetes: Secondary | ICD-10-CM | POA: Diagnosis present

## 2018-06-09 DIAGNOSIS — Z6829 Body mass index (BMI) 29.0-29.9, adult: Secondary | ICD-10-CM

## 2018-06-09 DIAGNOSIS — Z8249 Family history of ischemic heart disease and other diseases of the circulatory system: Secondary | ICD-10-CM

## 2018-06-09 DIAGNOSIS — D696 Thrombocytopenia, unspecified: Secondary | ICD-10-CM | POA: Diagnosis not present

## 2018-06-09 DIAGNOSIS — I5023 Acute on chronic systolic (congestive) heart failure: Secondary | ICD-10-CM | POA: Diagnosis present

## 2018-06-09 DIAGNOSIS — K219 Gastro-esophageal reflux disease without esophagitis: Secondary | ICD-10-CM | POA: Diagnosis present

## 2018-06-09 DIAGNOSIS — K921 Melena: Secondary | ICD-10-CM | POA: Diagnosis not present

## 2018-06-09 DIAGNOSIS — I420 Dilated cardiomyopathy: Secondary | ICD-10-CM | POA: Diagnosis present

## 2018-06-09 DIAGNOSIS — F329 Major depressive disorder, single episode, unspecified: Secondary | ICD-10-CM | POA: Diagnosis not present

## 2018-06-09 DIAGNOSIS — Z951 Presence of aortocoronary bypass graft: Secondary | ICD-10-CM

## 2018-06-09 DIAGNOSIS — R001 Bradycardia, unspecified: Secondary | ICD-10-CM | POA: Diagnosis not present

## 2018-06-09 DIAGNOSIS — I10 Essential (primary) hypertension: Secondary | ICD-10-CM | POA: Diagnosis present

## 2018-06-09 DIAGNOSIS — G4730 Sleep apnea, unspecified: Secondary | ICD-10-CM | POA: Diagnosis present

## 2018-06-09 DIAGNOSIS — I428 Other cardiomyopathies: Secondary | ICD-10-CM

## 2018-06-09 DIAGNOSIS — R079 Chest pain, unspecified: Secondary | ICD-10-CM | POA: Diagnosis not present

## 2018-06-09 DIAGNOSIS — K625 Hemorrhage of anus and rectum: Secondary | ICD-10-CM | POA: Diagnosis present

## 2018-06-09 DIAGNOSIS — Z79899 Other long term (current) drug therapy: Secondary | ICD-10-CM

## 2018-06-09 DIAGNOSIS — F419 Anxiety disorder, unspecified: Secondary | ICD-10-CM | POA: Diagnosis not present

## 2018-06-09 DIAGNOSIS — I4729 Other ventricular tachycardia: Secondary | ICD-10-CM

## 2018-06-09 DIAGNOSIS — E669 Obesity, unspecified: Secondary | ICD-10-CM | POA: Diagnosis present

## 2018-06-09 DIAGNOSIS — R0602 Shortness of breath: Secondary | ICD-10-CM | POA: Diagnosis present

## 2018-06-09 DIAGNOSIS — Z8711 Personal history of peptic ulcer disease: Secondary | ICD-10-CM

## 2018-06-09 DIAGNOSIS — Z87891 Personal history of nicotine dependence: Secondary | ICD-10-CM

## 2018-06-09 HISTORY — DX: Heart failure, unspecified: I50.9

## 2018-06-09 LAB — CBC
HCT: 45.5 % (ref 39.0–52.0)
Hemoglobin: 14.5 g/dL (ref 13.0–17.0)
MCH: 27.6 pg (ref 26.0–34.0)
MCHC: 31.9 g/dL (ref 30.0–36.0)
MCV: 86.5 fL (ref 78.0–100.0)
Platelets: 144 10*3/uL — ABNORMAL LOW (ref 150–400)
RBC: 5.26 MIL/uL (ref 4.22–5.81)
RDW: 14.6 % (ref 11.5–15.5)
WBC: 8.4 10*3/uL (ref 4.0–10.5)

## 2018-06-09 LAB — PROTIME-INR
INR: 1.14
Prothrombin Time: 14.5 seconds (ref 11.4–15.2)

## 2018-06-09 LAB — BASIC METABOLIC PANEL
Anion gap: 13 (ref 5–15)
BUN: 16 mg/dL (ref 6–20)
CO2: 20 mmol/L — ABNORMAL LOW (ref 22–32)
Calcium: 9.4 mg/dL (ref 8.9–10.3)
Chloride: 107 mmol/L (ref 98–111)
Creatinine, Ser: 1.4 mg/dL — ABNORMAL HIGH (ref 0.61–1.24)
GFR calc Af Amer: >60 mL/min (ref >60)
GFR calc non Af Amer: 55 mL/min — ABNORMAL LOW (ref >60)
Glucose, Bld: 99 mg/dL (ref 70–99)
Potassium: 4.3 mmol/L (ref 3.5–5.1)
Sodium: 140 mmol/L (ref 135–145)

## 2018-06-09 LAB — D-DIMER, QUANTITATIVE: D-Dimer, Quant: 1.21 ug/mL-FEU — ABNORMAL HIGH (ref 0.00–0.50)

## 2018-06-09 LAB — TROPONIN I
Troponin I: 0.04 ng/mL (ref <0.03)
Troponin I: 0.04 ng/mL (ref <0.03)

## 2018-06-09 LAB — APTT: aPTT: 31 seconds (ref 24–36)

## 2018-06-09 LAB — POCT I-STAT TROPONIN I: Troponin i, poc: 0.03 ng/mL (ref 0.00–0.08)

## 2018-06-09 LAB — BRAIN NATRIURETIC PEPTIDE: B Natriuretic Peptide: 764 pg/mL — ABNORMAL HIGH (ref 0.0–100.0)

## 2018-06-09 MED ORDER — ACETAMINOPHEN 325 MG PO TABS: 650.0000 mg | ORAL_TABLET | Freq: Four times a day (QID) | ORAL | Status: DC | PRN

## 2018-06-09 MED ORDER — CARVEDILOL 12.5 MG PO TABS
12.5000 mg | ORAL_TABLET | Freq: Two times a day (BID) | ORAL | Status: DC
  Administered 2018-06-09: 12.5 mg via ORAL
  Filled 2018-06-09: qty 1

## 2018-06-09 MED ORDER — ONDANSETRON HCL 4 MG PO TABS: 4.0000 mg | ORAL_TABLET | Freq: Four times a day (QID) | ORAL | Status: DC | PRN

## 2018-06-09 MED ORDER — LISINOPRIL 5 MG PO TABS: 2.5000 mg | ORAL_TABLET | Freq: Every day | ORAL | Status: DC

## 2018-06-09 MED ORDER — SPIRONOLACTONE 12.5 MG HALF TABLET
12.5000 mg | ORAL_TABLET | Freq: Every day | ORAL | Status: DC
  Administered 2018-06-09 – 2018-06-10 (×2): 12.5 mg via ORAL
  Filled 2018-06-09 (×2): qty 1

## 2018-06-09 MED ORDER — FAMOTIDINE IN NACL 20-0.9 MG/50ML-% IV SOLN
20.0000 mg | Freq: Two times a day (BID) | INTRAVENOUS | Status: DC
  Administered 2018-06-09: 20 mg via INTRAVENOUS
  Filled 2018-06-09: qty 50

## 2018-06-09 MED ORDER — CARVEDILOL 12.5 MG PO TABS: 12.5000 mg | ORAL_TABLET | Freq: Two times a day (BID) | ORAL | Status: DC

## 2018-06-09 MED ORDER — SENNA 8.6 MG PO TABS
1.0000 | ORAL_TABLET | Freq: Two times a day (BID) | ORAL | Status: DC
  Administered 2018-06-09 – 2018-06-10 (×2): 8.6 mg via ORAL
  Filled 2018-06-09 (×2): qty 1

## 2018-06-09 MED ORDER — IOPAMIDOL (ISOVUE-370) INJECTION 76%
80.0000 mL | Freq: Once | INTRAVENOUS | Status: AC | PRN
  Administered 2018-06-09: 80 mL via INTRAVENOUS

## 2018-06-09 MED ORDER — ALBUTEROL SULFATE (2.5 MG/3ML) 0.083% IN NEBU: 5.0000 mg | INHALATION_SOLUTION | Freq: Once | RESPIRATORY_TRACT | Status: DC

## 2018-06-09 MED ORDER — SODIUM CHLORIDE 0.9 % IV SOLN
INTRAVENOUS | Status: DC
  Administered 2018-06-09 (×2): via INTRAVENOUS

## 2018-06-09 MED ORDER — ACETAMINOPHEN 650 MG RE SUPP: 650.0000 mg | Freq: Four times a day (QID) | RECTAL | Status: DC | PRN

## 2018-06-09 MED ORDER — SIMVASTATIN 20 MG PO TABS
20.0000 mg | ORAL_TABLET | Freq: Every day | ORAL | Status: DC
  Administered 2018-06-09: 20 mg via ORAL
  Filled 2018-06-09: qty 1

## 2018-06-09 MED ORDER — FUROSEMIDE 10 MG/ML IJ SOLN
60.0000 mg | Freq: Once | INTRAMUSCULAR | Status: AC
  Administered 2018-06-09: 60 mg via INTRAVENOUS
  Filled 2018-06-09: qty 6

## 2018-06-09 MED ORDER — VITAMIN B-12 1000 MCG PO TABS
1000.0000 ug | ORAL_TABLET | Freq: Every day | ORAL | Status: DC
  Administered 2018-06-09 – 2018-06-10 (×2): 1000 ug via ORAL
  Filled 2018-06-09 (×2): qty 1

## 2018-06-09 MED ORDER — IOPAMIDOL (ISOVUE-370) INJECTION 76%
INTRAVENOUS | Status: AC
  Filled 2018-06-09: qty 100

## 2018-06-09 MED ORDER — ONDANSETRON HCL 4 MG/2ML IJ SOLN
4.0000 mg | Freq: Four times a day (QID) | INTRAMUSCULAR | Status: DC | PRN
  Administered 2018-06-09: 4 mg via INTRAVENOUS
  Filled 2018-06-09: qty 2

## 2018-06-09 NOTE — Consult Note (Signed)
Cardiology Consultation:   Patient ID: Bruce Robertson; [id number]; 11-29-1960   Admit date: 06/09/2018 Date of Consult: 06/09/2018  Primary Care Provider: Darden Amber, PA Primary Cardiologist: [name] MD Primary Electrophysiologist:  Allegra Lai MD   Patient Profile:   Bruce Robertson is a 57 y.o. male with a hx of systolic CHF who is being seen today for the evaluation of pulmonary edema  at the request of Dr. Wendy Poet.  History of Present Illness:   Bruce Robertson has a history of nonischemic CM initially diagnosed in 2006. Coronary arteries were normal at that time but during cath he developed left main dissection and required CABG with SVG to the LAD and SVG to the OM. Repeat cath in 2008 showed patent grafts. EF was 20-25% but subsequently improved to 45%. Since then LV function has deteriorated. He was followed in the heart failure clinic and had an ICD placed. Last Echo in May 2019 showed EF 20-25%. He is also followed by Dr. Claiborne Billings for OSA.   Patient states he was in his usual state of health until yesterday. He states he developed SOB that worsened throughout the day. Reports weight has been fluctuating wildly. No increase in edema. Dyspnea worsened during the night to the point he could not get his breath and had son bring him to ED. Denies any chest pain. Did have associated N/V. Given IV lasix with marked diuresis of 3 liters today with significant improvement in symptoms. He reports compliance with medication. States he may not take them on time but he does take him. Some dietary noncompliance. 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   Past Medical History:  Diagnosis Date  . AICD (automatic cardioverter/defibrillator) present   . Anxiety   . Cardiomyopathy    Nonischemic. EF has been about 45%. S/P CABG.;  b.  Echo 4/14: EF 25%, global HK with inf and mid apical AK, restrictive physiology with E/e' > 15  (elevated LV filling pressure), trivial AI/MR, mild to mod LAE, mild RVE, mild reduced RVSF, mild RAE, mod TR, PASP 74 (severe pulmonary HTN)   . CHF (congestive heart failure) ([place])   . Colon polyps 2002  . Coronary artery disease   . Depression   . Dyslipidemia    hx (03/20/2017)  . GERD (gastroesophageal reflux disease)   . History of bleeding peptic ulcer   . History of echocardiogram    Echo 10/16:  EF 25-30%, poss non-compaction, diff HK with inf-lat and apical HK, restrictive physio, mild AI, severe LAE, mild RVE with mild reduced RVSF, PASP 42 mmHg  . Hyperplastic rectal polyp   . OSA on CPAP    Mild  . Tobacco abuse    Remote    Past Surgical History:  Procedure Laterality Date  . CARDIAC CATHETERIZATION  2007;  2009  . CARDIAC DEFIBRILLATOR PLACEMENT  03/20/2017  . CORONARY ARTERY BYPASS GRAFT  2007   Had a left main dissection after catheterization. Underwent a saphenous vein graft to the LAD and a saphenous vein graft to obtuse marginal.  . ICD IMPLANT N/A 03/20/2017   Procedure: ICD Implant;  Surgeon: Constance Haw, MD;  Location: [facility];  Service: Cardiovascular;  Laterality: N/A;  . PATELLAR TENDON REPAIR Right 2005     Home Medications:  Prior to Admission medications   Medication Sig Start Date End Date Taking? Authorizing Provider  carvedilol (COREG) 12.5 MG tablet  TAKE ONE TABLET BY MOUTH TWICE DAILY WITH MEALS 10/14/17  Yes Camnitz, Will Hassell Done, MD  furosemide (LASIX) 40 MG tablet Take 40 mg by mouth.   Yes [provider]  lisinopril (PRINIVIL,ZESTRIL) 2.5 MG tablet Take 1 tablet (2.5 mg total) by mouth daily. 05/18/18 08/16/18 Yes Minus Breeding, MD  simvastatin (ZOCOR) 20 MG tablet TAKE ONE TABLET BY MOUTH AT BEDTIME 08/29/17  Yes Minus Breeding, MD  spironolactone (ALDACTONE) 25 MG tablet Take 12.5 mg by mouth daily.   Yes [provider]  vitamin B-12 (CYANOCOBALAMIN) 1000 MCG tablet Take 1,000 mcg by mouth daily.   Yes  [provider]    Inpatient Medications: Scheduled Meds: . albuterol  5 mg Nebulization Once  . carvedilol  12.5 mg Oral BID WC  . iopamidol      . lisinopril  2.5 mg Oral Daily  . senna  1 tablet Oral BID  . simvastatin  20 mg Oral QHS  . spironolactone  12.5 mg Oral Daily  . vitamin B-12  1,000 mcg Oral Daily   Continuous Infusions: . sodium chloride 10 mL/hr at 06/09/18 1058  . famotidine (PEPCID) IV     PRN Meds: acetaminophen **OR** acetaminophen, ondansetron **OR** ondansetron (ZOFRAN) IV  Allergies:   No Known Allergies  Social History:   Social History   Socioeconomic History  . Marital status: Divorced    Spouse name: Not on file  . Number of children: Not on file  . Years of education: Not on file  . Highest education level: Not on file  Occupational History  . Occupation: Hydrologist: [employer]  . Financial resource strain: Not on file  . Food insecurity:    Worry: Not on file    Inability: Not on file  . Transportation needs:    Medical: Not on file    Non-medical: Not on file  Tobacco Use  . Smoking status: Former Smoker    Packs/day: 0.10    Years: 7.00    Pack years: 0.70    Types: Cigarettes  . Smokeless tobacco: Never Used  . Tobacco comment: 03/20/2017 "quit before 2000"  Substance and Sexual Activity  . Alcohol use: Yes    Alcohol/week: 0.0 oz    Comment: 03/20/2017 "might have 6 drinks/year @ the most"  . Drug use: Yes    Types: Marijuana    Comment: 03/20/2017 "joint qod"  . Sexual activity: Never  Lifestyle  . Physical activity:    Days per week: Not on file    Minutes per session: Not on file  . Stress: Not on file  Relationships  . Social connections:    Talks on phone: Not on file    Gets together: Not on file    Attends religious service: Not on file    Active member of club or organization: Not on file    Attends meetings of clubs or organizations: Not on file    Relationship  status: Not on file  . Intimate partner violence:    Fear of current or ex partner: Not on file    Emotionally abused: Not on file    Physically abused: Not on file    Forced sexual activity: Not on file  Other Topics Concern  . Not on file  Social History Narrative   Single    Family History:    Family History  Problem Relation Age of Onset  . Hypertension Mother   . Osteoarthritis  Mother   . Heart failure Father   . Lung cancer Other   . Cancer Other      ROS:  Please see the history of present illness.   All other ROS reviewed and negative.     Physical Exam/Data:   Vitals:   06/09/18 1400 06/09/18 1430 06/09/18 1500 06/09/18 1556  BP: 125/82 103/66 106/74 126/85  Pulse: 74 78 77 62  Resp:      Temp:    99.1 F (37.3 C)  TempSrc:    Oral  SpO2: 91% 93% 96% 99%  Weight:    220 lb 4 oz (99.9 kg)  Height:    6\' 1"  (1.854 m)    Intake/Output Summary (Last 24 hours) at 06/09/2018 1832 Last data filed at 06/09/2018 1358 Gross per 24 hour  Intake -  Output 3100 ml  Net -3100 ml   Filed Weights   06/09/18 0852 06/09/18 1556  Weight: 235 lb (106.6 kg) 220 lb 4 oz (99.9 kg)   Body mass index is 29.06 kg/m.  General:  Well nourished, well developed, in no acute distress HEENT: normal Lymph: no adenopathy Neck: no JVD Endocrine:  No thryomegaly Vascular: No carotid bruits; FA pulses 2+ bilaterally without bruits  Cardiac:  normal S1, S2; RRR; no murmur  Lungs:  clear to auscultation bilaterally, basilar rales Abd: soft, nontender, no hepatomegaly  Ext: no edema Musculoskeletal:  No deformities, BUE and BLE strength normal and equal Skin: warm and dry  Neuro:  CNs 2-12 intact, no focal abnormalities noted Psych:  Normal affect   EKG:  The EKG was personally reviewed and demonstrates:  NSR, right axis, nonspecific TWA.  Telemetry:  Telemetry was personally reviewed and demonstrates:  NSR, 5 beat run NSVT  Relevant CV Studies:  Echo 04/01/18: Study  Conclusions  - Left ventricle: The cavity size was severely dilated. Systolic   function was severely reduced. The estimated ejection fraction   was in the range of 20% to 25%. Diffuse hypokinesis. Features are   consistent with a pseudonormal left ventricular filling pattern,   with concomitant abnormal relaxation and increased filling   pressure (grade 2 diastolic dysfunction). Doppler parameters are   consistent with high ventricular filling pressure. - Aortic valve: There was mild regurgitation. - Mitral valve: Transvalvular velocity was within the normal range.   There was no evidence for stenosis. There was trivial   regurgitation. - Left atrium: The atrium was mildly dilated. - Right ventricle: The cavity size was normal. Wall thickness was   normal. Systolic function was mildly reduced. - Atrial septum: No defect or patent foramen ovale was identified   by color flow Doppler. - Tricuspid valve: There was moderate regurgitation. - Pulmonary arteries: Systolic pressure was mildly increased. PA   peak pressure: 44 mm Hg (S).   Laboratory Data:  Chemistry Recent Labs  Lab 06/09/18 0928  NA 140  K 4.3  CL 107  CO2 20*  GLUCOSE 99  BUN 16  CREATININE 1.40*  CALCIUM 9.4  GFRNONAA 55*  GFRAA >60  ANIONGAP 13    No results for input(s): PROT, ALBUMIN, AST, ALT, ALKPHOS, BILITOT in the last 168 hours. Hematology Recent Labs  Lab 06/09/18 0928  WBC 8.4  RBC 5.26  HGB 14.5  HCT 45.5  MCV 86.5  MCH 27.6  MCHC 31.9  RDW 14.6  PLT 144*   Cardiac Enzymes Recent Labs  Lab 06/09/18 0928 06/09/18 1643  TROPONINI 0.04* 0.04*    Recent Labs  Lab 06/09/18 1542  TROPIPOC 0.03    BNP Recent Labs  Lab 06/09/18 0928  BNP 764.0*    DDimer  Recent Labs  Lab 06/09/18 0928  DDIMER 1.21*    Radiology/Studies:  Dg Chest 2 View  Result Date: 06/09/2018 CLINICAL DATA:  Chest pain, diaphoresis, dizziness began 2 days ago and has increased today. Near syncopal  sensation. EXAM: CHEST - 2 VIEW COMPARISON:  PA and lateral chest x-ray of February 16, 2018 FINDINGS: The lungs are adequately inflated. The interstitial markings are increased. The pulmonary vascularity is engorged. The cardiac silhouette is top-normal in size. There is no pleural effusion. The ICD is in stable position. The patient has undergone previous CABG. The sternal wires appear intact. The retrosternal soft tissues are normal. The observed bony thorax is unremarkable. IMPRESSION: Findings worrisome for CHF with mild interstitial edema. No focal pneumonia. Electronically Signed   By: David  Martinique M.D.   On: 06/09/2018 09:13   Ct Angio Chest Pe W And/or Wo Contrast  Result Date: 06/09/2018 CLINICAL DATA:  Shortness of breath EXAM: CT ANGIOGRAPHY CHEST WITH CONTRAST TECHNIQUE: Multidetector CT imaging of the chest was performed using the standard protocol during bolus administration of intravenous contrast. Multiplanar CT image reconstructions and MIPs were obtained to evaluate the vascular anatomy. CONTRAST:  75mL ISOVUE-370 IOPAMIDOL (ISOVUE-370) INJECTION 76% COMPARISON:  None. FINDINGS: Cardiovascular: Satisfactory opacification of the pulmonary arteries to the segmental level. No evidence of pulmonary embolism. Normal heart size. No pericardial effusion. Prior median sternotomy and CABG. Mediastinum/Nodes: No enlarged mediastinal, hilar, or axillary lymph nodes. Thyromegaly without focal abnormality. Trachea demonstrate no significant findings. Mild distal esophageal wall thickening as can be seen with mild esophagitis. Lungs/Pleura: Small bilateral pleural effusions. Mild bilateral interstitial thickening and patchy areas of ground-glass opacity. Upper Abdomen: No acute abnormality. Musculoskeletal: No chest wall abnormality. No acute or significant osseous findings. Review of the MIP images confirms the above findings. IMPRESSION: 1. No evidence of pulmonary embolus. 2. Mild pulmonary edema.  Electronically Signed   By: Kathreen Devoid   On: 06/09/2018 13:34    Assessment and Plan:   1. Acute on chronic systolic CHF. Nonischemic. EF 20-25% by Echo in May. On Coreg and relatively low doses of lisinopril and aldactone. May benefit from [facility] if this is affordable. Will need to hold Lisinopril for 48 hours before starting Entresto. Will need case management to assess if this is affordable for the patient. If so would start after off ACEi for 36 hours. Continue IV lasix. Will need to monitor renal function closely. Reinforce need for dietary sodium restriction. 2. History of left main dissection- iatrogenic. S/p CABG. No angina. 3. CKD stage 3. Creatinine is at baseline 4. OSA on CPAP 5. S/p ICD 6. NSVT asymptomatic. On beta blocker.    For questions or updates, please contact [place] Please consult [email or web address] for contact info under Cardiology/STEMI.   Signed, Peter Martinique, MD  06/09/2018 6:32 PM

## 2018-06-09 NOTE — Progress Notes (Signed)
Patient is nauseated, vomited up some of her soda.   Will give some IV zofran.  Also notified MD - pt wanted to know result of CT and chest x-ray.   MD- returned called-  CT was neg for PE, some edema noted.  CXR was similar per MD Will pass along to patient

## 2018-06-09 NOTE — ED Triage Notes (Signed)
Tech from xray called pt. Breathing hard and diaphoretic. Brought pt. Back to Triage 3 for viewing.

## 2018-06-09 NOTE — H&P (Addendum)
[facility] Admission History and Physical Service Pager: [phone]  Patient name: Bruce Robertson Medical record number: [id number] Date of birth: 1961-08-16 Age: 57 y.o. Gender: male  Primary Care Provider: Darden Amber, PA Consultants: Cardiology Code Status: Full  Chief Complaint: SOB  Assessment and Plan: Bruce Robertson is a 57 y.o. male presenting with SOB.  Medical history is significant for nonischemic cardiomyopathy (20 to 25% EF on 03/2018) ICD, S/P CABG, HLD, HTN, internal hemorrhoids, +THC use, obesity, GERD.   SOB 2/2 acute decompensated heart failure PT has a history of nonischemic cardiomyopathy (EF 20 to 25% on 03/2018) with ICD, s/p CABG.  Patient presented with significant shortness of breath.  On presentation vital signs remarkable for tachypnea up to 30 respirations per minute, mild hypertension with systolics up to 160.  Physical exam showed mild crackles lower lobes bilaterally, no LE edema, no JVD.  Admission EKG unremarkable.  Initial troponin was 0.04 and repeat was 0.03.  D-dimer was 1.21, BNP 764 up from previous of 287.  Chest x-ray showed vascular congestion with no other acute cardiopulmonary findings.  CT angio showed no evidence of PE.  Pneumonia unlikely due to lack of findings on x-ray, no fever, no elevated WBC.  Obstructive airway disease unlikely due to lack of stridor/wheezing on physical exam and in addition to lack of history.  MI unlikely due to lack of EKG findings and relatively flat troponins.  Most likely diagnosis at this time is acute decompensated heart failure the setting of known nonischemic cardiomyopathy. Taking 40 mg lasix daily at home.  Recent echo from 03/2018 shows EF 20-25%, will hold off on obtaining a new one at this time.  -Admit to medsurg, attending Dr. McDiarmid  -Assess fluid status in a.m. for diuresis plan.  Status post IV Lasix 60 mg x1 in ED with excellent output  -holding home po lasix for  now -continuous cardiac monitoring -Trend trops -Strict I's and O's -daily weights -A.m. EKG -f/u A1c, lipid panel -vitals per unit routine   Nausea/vomiting/LLQ abdominal pain Patient reports at least 4 episodes of thick white emesis, nonbloody.  On exam, abdomen is mildly tender, no signs of rebound or guarding to indicate an acute abdomen.  On admission patient has not had any episodes of vomiting and appears to be doing better at this time.  Possible this is related to an acute viral gastritis vs gastric ulcer given his prior history (see below). Patient thirsty and tolerating liquids in the ED.  He may eat as he is able his diet is in.  -encourage po, continue to monitor  -zofran prn  Possible acute GI bleed No known active bleeding at this time with stable hgb of 14 which appears to be his baseline.  Vitals stable. Patient claims have a history of gastric ulcer which has led to GI bleeding in the past.  Reports colonoscopy (01/2012) which showed polyps which were removed at that time.  Patient reports hematochezia and melena x3 days.  Patient currently with LLQ abdominal pain possibly secondary to infectious gastritis or ?recurrent gastric ulcer. -Follow-up FOBT -Monitor hemoglobin -Famotidine 20 mg IV BID -Holding ASA and Lovenox; may add if no active bleed  Nonischemic cardiomyopathy w/ ICD - chronic Patient's history of nonischemic cardiomyopathy goes back to at least 2006.  It appears that his CABGx2 was in 2006 according to the e-chart conversion note on 08/23/2005. It was apparently an emergent CABG due to an coronary artery dissection during a cardiac catheterization.  Last ECHO 03/2018 shows EF 20-25%. ICD placed 03/2017.  Appears to follow regularly with cardiology.  His home medications include carvedilol 12.5 mg, lisinopril 2.5 mg, spironolactone 25 mg. -Continue home meds   CKD III BL Cr: 1.4. Admission Cr. 1.4.  -monitor Cr with daily BMP  HTN History of chronic  hypertension, on admission his blood pressure was mildly elevated to132/96 since that has come down to normal limits.  Home medications as above. -Continue home meds  HLD Last lipid panel 07/2016 total cholesterol: 200, LDL: 136, HDL: 49 -f/u lipid panel -Continue simvastatin 20   FEN/GI: Heart healthy diet Prophylaxis: SCDs due to possible GI bleed  Disposition: admit to MedSurg  History of Present Illness:  Bruce Robertson is a 57 y.o. male presenting with SOB.  Medical history is significant for nonischemic cardiomyopathy (20 to 25% EF on 03/2018) ICD, S/P CABG, marijuana smoker, HLD, HTN, internal hemorrhoids, GERD.  Patients primary concern is shortness of breath.  His shortness of breath began yesterday without any apparent cause.  Was improved with sitting up and worsened by laying flat.  Patient was not able to sleep flat last night.  He was hoping that his shortness of breath would improve with rest but that did not seem to be the case.  Patient also had significant nausea with vomiting yesterday at least 4 episodes of vomiting thick white emesis without blood.  No one else in the family appears to be sick does not believe that he has eaten anything unusual lately.  Patient was feeling worse this morning and decided to come to the ED to be seen.  Also noted chest pain on the left side around the area of his left nipple.  The pain was described as a sharp pain that he only noticed on inspiration or with coughing.  The pain was not worse with palpation does not seem to be related to exertion.  While shortness of breath is patient's primary concern seems that he has not been feeling well for the past several days.  In the past week he has noticed that he has had multiple bloody bowel movements the present with gross blood in addition to black tarry stools.  He said that he has had episodes of bloody stool like this in the past that were related to a gastric ulcer years ago.  He reported a  colonoscopy at that time which revealed multiple polyps which were removed.  Long term marijuana use(since childhood), no tobacco use, social alcohol use.  Denies cocaine and heroin use.   Review Of Systems: Per HPI with the following additions:   Review of Systems  Constitutional: Positive for chills. Negative for fever.  HENT: Negative for congestion and sore throat.   Respiratory: Positive for cough and shortness of breath. Negative for hemoptysis and wheezing.   Cardiovascular: Positive for chest pain. Negative for palpitations and leg swelling.  Gastrointestinal: Positive for abdominal pain, blood in stool, diarrhea, melena, nausea and vomiting.  Genitourinary: Negative for dysuria, frequency and urgency.  Skin: Negative for rash.  Neurological: Positive for tremors.    Patient Active Problem List   Diagnosis Date Noted  . Hypotension due to drugs 02/09/2018  . CHF (congestive heart failure) ([place]) 03/20/2017  . CHF exacerbation ([place]) 07/31/2016  . Dyspnea on exertion 03/05/2016  . Essential hypertension 10/31/2015  . Acute on chronic systolic (congestive) heart failure ([place]) 08/29/2015  . Hx of CABG 08/29/2015  . Chronic renal disease, stage III ([place]) 08/29/2015  .  Sleep apnea 06/10/2011  . Fatigue 06/10/2011  . OBESITY, UNSPECIFIED 05/02/2010  . HEMORRHOIDS, EXTERNAL 02/08/2010  . ESOPHAGEAL REFLUX 02/08/2010  . RECTAL BLEEDING 02/08/2010  . DYSLIPIDEMIA 04/06/2009  . TOBACCO USER 04/06/2009  . Dilated cardiomyopathy ([place]) 04/06/2009    Past Medical History: Past Medical History:  Diagnosis Date  . AICD (automatic cardioverter/defibrillator) present   . Anxiety   . Cardiomyopathy    Nonischemic. EF has been about 45%. S/P CABG.;  b.  Echo 4/14: EF 25%, global HK with inf and mid apical AK, restrictive physiology with E/e' > 15 (elevated LV filling pressure), trivial AI/MR, mild to mod LAE, mild RVE, mild reduced RVSF, mild RAE, mod TR, PASP 74 (severe pulmonary HTN)    . CHF (congestive heart failure) ([place])   . Colon polyps 2002  . Coronary artery disease   . Depression   . Dyslipidemia    hx (03/20/2017)  . GERD (gastroesophageal reflux disease)   . History of bleeding peptic ulcer   . History of echocardiogram    Echo 10/16:  EF 25-30%, poss non-compaction, diff HK with inf-lat and apical HK, restrictive physio, mild AI, severe LAE, mild RVE with mild reduced RVSF, PASP 42 mmHg  . Hyperplastic rectal polyp   . OSA on CPAP    Mild  . Tobacco abuse    Remote    Past Surgical History: Past Surgical History:  Procedure Laterality Date  . CARDIAC CATHETERIZATION  2007;  2009  . CARDIAC DEFIBRILLATOR PLACEMENT  03/20/2017  . CORONARY ARTERY BYPASS GRAFT  2007   Had a left main dissection after catheterization. Underwent a saphenous vein graft to the LAD and a saphenous vein graft to obtuse marginal.  . ICD IMPLANT N/A 03/20/2017   Procedure: ICD Implant;  Surgeon: Constance Haw, MD;  Location: [facility];  Service: Cardiovascular;  Laterality: N/A;  . PATELLAR TENDON REPAIR Right 2005    Social History: Social History   Tobacco Use  . Smoking status: Former Smoker    Packs/day: 0.10    Years: 7.00    Pack years: 0.70    Types: Cigarettes  . Smokeless tobacco: Never Used  . Tobacco comment: 03/20/2017 "quit before 2000"  Substance Use Topics  . Alcohol use: Yes    Alcohol/week: 0.0 oz    Comment: 03/20/2017 "might have 6 drinks/year @ the most"  . Drug use: Yes    Types: Marijuana    Comment: 03/20/2017 "joint qod"   Additional social history: pt attended by large family, appears well supported (mother, sister, brother, two nieces) Please also refer to relevant sections of EMR.  Family History: Family History  Problem Relation Age of Onset  . Hypertension Mother   . Osteoarthritis Mother   . Heart failure Father   . Lung cancer Other   . Cancer Other    Allergies and Medications: No Known Allergies No current  facility-administered medications on file prior to encounter.    Current Outpatient Medications on File Prior to Encounter  Medication Sig Dispense Refill  . carvedilol (COREG) 12.5 MG tablet TAKE ONE TABLET BY MOUTH TWICE DAILY WITH MEALS 180 tablet 2  . furosemide (LASIX) 40 MG tablet Take 40 mg by mouth.    Marland Kitchen lisinopril (PRINIVIL,ZESTRIL) 2.5 MG tablet Take 1 tablet (2.5 mg total) by mouth daily. 90 tablet 3  . simvastatin (ZOCOR) 20 MG tablet TAKE ONE TABLET BY MOUTH AT BEDTIME 90 tablet 3  . spironolactone (ALDACTONE) 25 MG tablet Take  12.5 mg by mouth daily.    . vitamin B-12 (CYANOCOBALAMIN) 1000 MCG tablet Take 1,000 mcg by mouth daily.      Objective: BP 126/85 (BP Location: Right Arm)   Pulse 62   Temp 99.1 F (37.3 C) (Oral)   Resp 17   Ht 6\' 1"  (1.854 m)   Wt 220 lb 4 oz (99.9 kg)   SpO2 99%   BMI 29.06 kg/m    Physical Exam  Constitutional: He is oriented to person, place, and time. He appears well-developed and well-nourished. He appears ill.  Patient was lying in the bed in some discomfort keeping his eyes closed for much of the interview.  He appeared to be well supported by his family many of whom are in the room, a total of 5 people.  HENT:  No JVD  Neck: No JVD present.  Cardiovascular: Normal rate, regular rhythm and intact distal pulses.  Murmur (2/6 systolic) heard. Pulmonary/Chest: Effort normal. No stridor. No respiratory distress. He has no decreased breath sounds. He has no wheezes. He has rhonchi in the right lower field and the left lower field. He has no rales. He exhibits no tenderness and no edema.  Abdominal: Soft. Bowel sounds are normal. There is tenderness (mild tenderness in LLQ).  Musculoskeletal:       Right lower leg: He exhibits no edema.       Left lower leg: He exhibits no edema.  Lymphadenopathy:    He has no cervical adenopathy.  Neurological: He is alert and oriented to person, place, and time. He is not disoriented. No cranial nerve  deficit.  Psychiatric: He has a normal mood and affect. His behavior is normal.   Labs and Imaging: CBC BMET  Recent Labs  Lab 06/09/18 0928  WBC 8.4  HGB 14.5  HCT 45.5  PLT 144*   Recent Labs  Lab 06/09/18 0928  NA 140  K 4.3  CL 107  CO2 20*  BUN 16  CREATININE 1.40*  GLUCOSE 99  CALCIUM 9.4     Dg Chest 2 View  Result Date: 06/09/2018 CLINICAL DATA:  Chest pain, diaphoresis, dizziness began 2 days ago and has increased today. Near syncopal sensation. EXAM: CHEST - 2 VIEW COMPARISON:  PA and lateral chest x-ray of February 16, 2018 FINDINGS: The lungs are adequately inflated. The interstitial markings are increased. The pulmonary vascularity is engorged. The cardiac silhouette is top-normal in size. There is no pleural effusion. The ICD is in stable position. The patient has undergone previous CABG. The sternal wires appear intact. The retrosternal soft tissues are normal. The observed bony thorax is unremarkable. IMPRESSION: Findings worrisome for CHF with mild interstitial edema. No focal pneumonia. Electronically Signed   By: David  Martinique M.D.   On: 06/09/2018 09:13   Ct Angio Chest Pe W And/or Wo Contrast  Result Date: 06/09/2018 CLINICAL DATA:  Shortness of breath EXAM: CT ANGIOGRAPHY CHEST WITH CONTRAST TECHNIQUE: Multidetector CT imaging of the chest was performed using the standard protocol during bolus administration of intravenous contrast. Multiplanar CT image reconstructions and MIPs were obtained to evaluate the vascular anatomy. CONTRAST:  72mL ISOVUE-370 IOPAMIDOL (ISOVUE-370) INJECTION 76% COMPARISON:  None. FINDINGS: Cardiovascular: Satisfactory opacification of the pulmonary arteries to the segmental level. No evidence of pulmonary embolism. Normal heart size. No pericardial effusion. Prior median sternotomy and CABG. Mediastinum/Nodes: No enlarged mediastinal, hilar, or axillary lymph nodes. Thyromegaly without focal abnormality. Trachea demonstrate no significant  findings. Mild distal esophageal wall thickening as  can be seen with mild esophagitis. Lungs/Pleura: Small bilateral pleural effusions. Mild bilateral interstitial thickening and patchy areas of ground-glass opacity. Upper Abdomen: No acute abnormality. Musculoskeletal: No chest wall abnormality. No acute or significant osseous findings. Review of the MIP images confirms the above findings. IMPRESSION: 1. No evidence of pulmonary embolus. 2. Mild pulmonary edema. Electronically Signed   By: Kathreen Devoid   On: 06/09/2018 13:34     Lovenia Kim, MD 06/09/2018, 6:33 PM PGY-1, [place]  I have seen and evaluated the above patient with Dr. Pilar Plate and agree with his documentation.  I have included my edits in blue.   Lovenia Kim MD  [place], PGY-3

## 2018-06-09 NOTE — ED Provider Notes (Signed)
[facility] EMERGENCY DEPARTMENT Provider Note   CSN: [id number] Arrival date & time: 06/09/18  0840     History   Chief Complaint Chief Complaint  Patient presents with  . Shortness of Breath    HPI Bruce Robertson is a 57 y.o. male.  Patient with hx non-ischemic cardiomyopathy, c/o acute onset sob this AM. Symptoms moderate, persistent, worse w activity/exertion. Denies chest pain. No leg pain or swelling. No hx dvt or pe. No fam hx premature cad. States compliant w home meds, urinating regulary. ?orthopnea. No pnd. Denies fever or chills. No cough or uri symptoms.  The history is provided by the patient.  Shortness of Breath  Pertinent negatives include no fever, no headaches, no sore throat, no neck pain, no cough, no chest pain, no vomiting, no abdominal pain, no rash and no leg swelling.    Past Medical History:  Diagnosis Date  . AICD (automatic cardioverter/defibrillator) present   . Anxiety   . Cardiomyopathy    Nonischemic. EF has been about 45%. S/P CABG.;  b.  Echo 4/14: EF 25%, global HK with inf and mid apical AK, restrictive physiology with E/e' > 15 (elevated LV filling pressure), trivial AI/MR, mild to mod LAE, mild RVE, mild reduced RVSF, mild RAE, mod TR, PASP 74 (severe pulmonary HTN)   . CHF (congestive heart failure) ([place])   . Colon polyps 2002  . Coronary artery disease   . Depression   . Dyslipidemia    hx (03/20/2017)  . GERD (gastroesophageal reflux disease)   . History of bleeding peptic ulcer   . History of echocardiogram    Echo 10/16:  EF 25-30%, poss non-compaction, diff HK with inf-lat and apical HK, restrictive physio, mild AI, severe LAE, mild RVE with mild reduced RVSF, PASP 42 mmHg  . Hyperplastic rectal polyp   . OSA on CPAP    Mild  . Tobacco abuse    Remote    Patient Active Problem List   Diagnosis Date Noted  . Hypotension due to drugs 02/09/2018  . CHF (congestive heart failure) ([place]) 03/20/2017  . CHF  exacerbation ([place]) 07/31/2016  . Dyspnea on exertion 03/05/2016  . Essential hypertension 10/31/2015  . Acute on chronic systolic (congestive) heart failure ([place]) 08/29/2015  . Hx of CABG 08/29/2015  . Chronic renal disease, stage III ([place]) 08/29/2015  . Sleep apnea 06/10/2011  . Fatigue 06/10/2011  . OBESITY, UNSPECIFIED 05/02/2010  . HEMORRHOIDS, EXTERNAL 02/08/2010  . ESOPHAGEAL REFLUX 02/08/2010  . RECTAL BLEEDING 02/08/2010  . DYSLIPIDEMIA 04/06/2009  . TOBACCO USER 04/06/2009  . Dilated cardiomyopathy ([place]) 04/06/2009    Past Surgical History:  Procedure Laterality Date  . CARDIAC CATHETERIZATION  2007;  2009  . CARDIAC DEFIBRILLATOR PLACEMENT  03/20/2017  . CORONARY ARTERY BYPASS GRAFT  2007   Had a left main dissection after catheterization. Underwent a saphenous vein graft to the LAD and a saphenous vein graft to obtuse marginal.  . ICD IMPLANT N/A 03/20/2017   Procedure: ICD Implant;  Surgeon: Constance Haw, MD;  Location: [facility] CV LAB;  Service: Cardiovascular;  Laterality: N/A;  . PATELLAR TENDON REPAIR Right 2005        Home Medications    Prior to Admission medications   Medication Sig Start Date End Date Taking? Authorizing Provider  aspirin EC 81 MG EC tablet Take 1 tablet (81 mg total) by mouth daily. 08/02/16  Yes Ophelia Shoulder, MD  carvedilol (COREG) 12.5 MG tablet TAKE ONE TABLET  BY MOUTH TWICE DAILY WITH MEALS 10/14/17  Yes Camnitz, Will Hassell Done, MD  furosemide (LASIX) 40 MG tablet Take 40 mg by mouth.   Yes [provider]  lisinopril (PRINIVIL,ZESTRIL) 2.5 MG tablet Take 1 tablet (2.5 mg total) by mouth daily. 05/18/18 08/16/18 Yes Minus Breeding, MD  Magnesium 300 MG CAPS Take 300 mg by mouth daily.   Yes [provider]  simvastatin (ZOCOR) 20 MG tablet TAKE ONE TABLET BY MOUTH AT BEDTIME 08/29/17  Yes Hochrein, Jeneen Rinks, MD  spironolactone (ALDACTONE) 25 MG tablet Take 12.5 mg by mouth daily.   Yes [provider]    vitamin B-12 (CYANOCOBALAMIN) 1000 MCG tablet Take 1,000 mcg by mouth daily.   Yes [provider]  zinc sulfate 220 (50 Zn) MG capsule Take 220 mg by mouth daily.   Yes [provider]    Family History Family History  Problem Relation Age of Onset  . Hypertension Mother   . Osteoarthritis Mother   . Heart failure Father   . Lung cancer Other   . Cancer Other     Social History Social History   Tobacco Use  . Smoking status: Former Smoker    Packs/day: 0.10    Years: 7.00    Pack years: 0.70    Types: Cigarettes  . Smokeless tobacco: Never Used  . Tobacco comment: 03/20/2017 "quit before 2000"  Substance Use Topics  . Alcohol use: Yes    Alcohol/week: 0.0 oz    Comment: 03/20/2017 "might have 6 drinks/year @ the most"  . Drug use: Yes    Types: Marijuana    Comment: 03/20/2017 "joint qod"     Allergies   Patient has no known allergies.   Review of Systems Review of Systems  Constitutional: Negative for chills and fever.  HENT: Negative for sore throat.   Eyes: Negative for redness.  Respiratory: Positive for shortness of breath. Negative for cough.   Cardiovascular: Negative for chest pain and leg swelling.  Gastrointestinal: Negative for abdominal pain and vomiting.  Genitourinary: Negative for flank pain.  Musculoskeletal: Negative for back pain and neck pain.  Skin: Negative for rash.  Neurological: Negative for headaches.  Hematological: Does not bruise/bleed easily.  Psychiatric/Behavioral: Negative for confusion.     Physical Exam Updated Vital Signs BP 121/73   Pulse 75   Temp 99 F (37.2 C) (Oral)   Resp (!) 29   Ht 1.854 m (6\' 1" )   Wt 106.6 kg (235 lb)   SpO2 93%   BMI 31.00 kg/m   Physical Exam  Constitutional: He appears well-developed and well-nourished.  Tachypneic, dyspneic.   HENT:  Mouth/Throat: Oropharynx is clear and moist.  Eyes: Conjunctivae are normal.  Neck: Neck supple. No tracheal deviation present.   Cardiovascular: Normal rate, regular rhythm, normal heart sounds and intact distal pulses. Exam reveals no gallop and no friction rub.  No murmur heard. Pulmonary/Chest: Effort normal. No accessory muscle usage. No respiratory distress. He has rales.  Abdominal: Soft. Bowel sounds are normal. He exhibits no distension. There is no tenderness.  Musculoskeletal: He exhibits no edema or tenderness.  Neurological: He is alert.  Skin: Skin is warm and dry. No rash noted.  Psychiatric: He has a normal mood and affect.  Nursing note and vitals reviewed.    ED Treatments / Results  Labs (all labs ordered are listed, but only abnormal results are displayed) Results for orders placed or performed during the hospital encounter of 50/35/46  Basic metabolic  panel  Result Value Ref Range   Sodium 140 135 - 145 mmol/L   Potassium 4.3 3.5 - 5.1 mmol/L   Chloride 107 98 - 111 mmol/L   CO2 20 (L) 22 - 32 mmol/L   Glucose, Bld 99 70 - 99 mg/dL   BUN 16 6 - 20 mg/dL   Creatinine, Ser 1.40 (H) 0.61 - 1.24 mg/dL   Calcium 9.4 8.9 - 10.3 mg/dL   GFR calc non Af Amer 55 (L) >60 mL/min   GFR calc Af Amer >60 >60 mL/min   Anion gap 13 5 - 15  CBC  Result Value Ref Range   WBC 8.4 4.0 - 10.5 K/uL   RBC 5.26 4.22 - 5.81 MIL/uL   Hemoglobin 14.5 13.0 - 17.0 g/dL   HCT 45.5 39.0 - 52.0 %   MCV 86.5 78.0 - 100.0 fL   MCH 27.6 26.0 - 34.0 pg   MCHC 31.9 30.0 - 36.0 g/dL   RDW 14.6 11.5 - 15.5 %   Platelets 144 (L) 150 - 400 K/uL  Troponin I  Result Value Ref Range   Troponin I 0.04 (HH) <0.03 ng/mL  Brain natriuretic peptide  Result Value Ref Range   B Natriuretic Peptide 764.0 (H) 0.0 - 100.0 pg/mL  D-dimer, quantitative (not at [facility])  Result Value Ref Range   D-Dimer, Quant 1.21 (H) 0.00 - 0.50 ug/mL-FEU   Dg Chest 2 View  Result Date: 06/09/2018 CLINICAL DATA:  Chest pain, diaphoresis, dizziness began 2 days ago and has increased today. Near syncopal sensation. EXAM: CHEST - 2 VIEW  COMPARISON:  PA and lateral chest x-ray of February 16, 2018 FINDINGS: The lungs are adequately inflated. The interstitial markings are increased. The pulmonary vascularity is engorged. The cardiac silhouette is top-normal in size. There is no pleural effusion. The ICD is in stable position. The patient has undergone previous CABG. The sternal wires appear intact. The retrosternal soft tissues are normal. The observed bony thorax is unremarkable. IMPRESSION: Findings worrisome for CHF with mild interstitial edema. No focal pneumonia. Electronically Signed   By: David  Martinique M.D.   On: 06/09/2018 09:13   Ct Angio Chest Pe W And/or Wo Contrast  Result Date: 06/09/2018 CLINICAL DATA:  Shortness of breath EXAM: CT ANGIOGRAPHY CHEST WITH CONTRAST TECHNIQUE: Multidetector CT imaging of the chest was performed using the standard protocol during bolus administration of intravenous contrast. Multiplanar CT image reconstructions and MIPs were obtained to evaluate the vascular anatomy. CONTRAST:  12mL ISOVUE-370 IOPAMIDOL (ISOVUE-370) INJECTION 76% COMPARISON:  None. FINDINGS: Cardiovascular: Satisfactory opacification of the pulmonary arteries to the segmental level. No evidence of pulmonary embolism. Normal heart size. No pericardial effusion. Prior median sternotomy and CABG. Mediastinum/Nodes: No enlarged mediastinal, hilar, or axillary lymph nodes. Thyromegaly without focal abnormality. Trachea demonstrate no significant findings. Mild distal esophageal wall thickening as can be seen with mild esophagitis. Lungs/Pleura: Small bilateral pleural effusions. Mild bilateral interstitial thickening and patchy areas of ground-glass opacity. Upper Abdomen: No acute abnormality. Musculoskeletal: No chest wall abnormality. No acute or significant osseous findings. Review of the MIP images confirms the above findings. IMPRESSION: 1. No evidence of pulmonary embolus. 2. Mild pulmonary edema. Electronically Signed   By: Kathreen Devoid    On: 06/09/2018 13:34    EKG None  Radiology Dg Chest 2 View  Result Date: 06/09/2018 CLINICAL DATA:  Chest pain, diaphoresis, dizziness began 2 days ago and has increased today. Near syncopal sensation. EXAM: CHEST - 2 VIEW COMPARISON:  PA  and lateral chest x-ray of February 16, 2018 FINDINGS: The lungs are adequately inflated. The interstitial markings are increased. The pulmonary vascularity is engorged. The cardiac silhouette is top-normal in size. There is no pleural effusion. The ICD is in stable position. The patient has undergone previous CABG. The sternal wires appear intact. The retrosternal soft tissues are normal. The observed bony thorax is unremarkable. IMPRESSION: Findings worrisome for CHF with mild interstitial edema. No focal pneumonia. Electronically Signed   By: David  Martinique M.D.   On: 06/09/2018 09:13   Ct Angio Chest Pe W And/or Wo Contrast  Result Date: 06/09/2018 CLINICAL DATA:  Shortness of breath EXAM: CT ANGIOGRAPHY CHEST WITH CONTRAST TECHNIQUE: Multidetector CT imaging of the chest was performed using the standard protocol during bolus administration of intravenous contrast. Multiplanar CT image reconstructions and MIPs were obtained to evaluate the vascular anatomy. CONTRAST:  40mL ISOVUE-370 IOPAMIDOL (ISOVUE-370) INJECTION 76% COMPARISON:  None. FINDINGS: Cardiovascular: Satisfactory opacification of the pulmonary arteries to the segmental level. No evidence of pulmonary embolism. Normal heart size. No pericardial effusion. Prior median sternotomy and CABG. Mediastinum/Nodes: No enlarged mediastinal, hilar, or axillary lymph nodes. Thyromegaly without focal abnormality. Trachea demonstrate no significant findings. Mild distal esophageal wall thickening as can be seen with mild esophagitis. Lungs/Pleura: Small bilateral pleural effusions. Mild bilateral interstitial thickening and patchy areas of ground-glass opacity. Upper Abdomen: No acute abnormality. Musculoskeletal: No  chest wall abnormality. No acute or significant osseous findings. Review of the MIP images confirms the above findings. IMPRESSION: 1. No evidence of pulmonary embolus. 2. Mild pulmonary edema. Electronically Signed   By: Kathreen Devoid   On: 06/09/2018 13:34    Procedures Procedures (including critical care time)  Medications Ordered in ED Medications  albuterol (PROVENTIL) (2.5 MG/3ML) 0.083% nebulizer solution 5 mg (5 mg Nebulization Not Given 06/09/18 1050)  0.9 %  sodium chloride infusion ( Intravenous New Bag/Given 06/09/18 1058)  iopamidol (ISOVUE-370) 76 % injection (has no administration in time range)  furosemide (LASIX) injection 60 mg (60 mg Intravenous Given 06/09/18 1058)  iopamidol (ISOVUE-370) 76 % injection 80 mL (80 mLs Intravenous Contrast Given 06/09/18 1259)     Initial Impression / Assessment and Plan / ED Course  I have reviewed the triage vital signs and the nursing notes.  Pertinent labs & imaging results that were available during my care of the patient were reviewed by me and considered in my medical decision making (see chart for details).  Iv ns. Continuous pulse ox and monitor. o2 [place]. Labs. Ecg. Cxr.  Labs reviewed - ddimer v high - will get ct.   cxr reviewed - no pna, vascular congestion/chf. Lasix iv.   Good urine output.  Ct reviewed - no PE.   Discussed pt with cardiology as he indicates no pcp, and has seen cardiology recently for cm/chf - cardiology indicates they will see/consult, but request medical service admission.  Unassigned medicine called to see.   Recheck dyspnea mildly improved.     Final Clinical Impressions(s) / ED Diagnoses   Final diagnoses:  None    ED Discharge Orders    None       Lajean Saver, MD 06/09/18 [phone]

## 2018-06-09 NOTE — ED Triage Notes (Signed)
Pt. Stated, I started getting SOB yesterday, today is worse.   P;t. In triage breathing fast.

## 2018-06-10 ENCOUNTER — Other Ambulatory Visit: Payer: Self-pay

## 2018-06-10 ENCOUNTER — Encounter (HOSPITAL_COMMUNITY): Payer: Self-pay | Admitting: Family Medicine

## 2018-06-10 DIAGNOSIS — Z95810 Presence of automatic (implantable) cardiac defibrillator: Secondary | ICD-10-CM | POA: Diagnosis not present

## 2018-06-10 DIAGNOSIS — N183 Chronic kidney disease, stage 3 (moderate): Secondary | ICD-10-CM | POA: Diagnosis not present

## 2018-06-10 DIAGNOSIS — I420 Dilated cardiomyopathy: Secondary | ICD-10-CM | POA: Diagnosis not present

## 2018-06-10 DIAGNOSIS — K625 Hemorrhage of anus and rectum: Secondary | ICD-10-CM | POA: Diagnosis not present

## 2018-06-10 DIAGNOSIS — F329 Major depressive disorder, single episode, unspecified: Secondary | ICD-10-CM | POA: Diagnosis not present

## 2018-06-10 DIAGNOSIS — E669 Obesity, unspecified: Secondary | ICD-10-CM

## 2018-06-10 DIAGNOSIS — Z6830 Body mass index (BMI) 30.0-30.9, adult: Secondary | ICD-10-CM | POA: Diagnosis not present

## 2018-06-10 DIAGNOSIS — D696 Thrombocytopenia, unspecified: Secondary | ICD-10-CM | POA: Diagnosis not present

## 2018-06-10 DIAGNOSIS — R0602 Shortness of breath: Secondary | ICD-10-CM | POA: Diagnosis not present

## 2018-06-10 DIAGNOSIS — K219 Gastro-esophageal reflux disease without esophagitis: Secondary | ICD-10-CM | POA: Diagnosis not present

## 2018-06-10 DIAGNOSIS — I130 Hypertensive heart and chronic kidney disease with heart failure and stage 1 through stage 4 chronic kidney disease, or unspecified chronic kidney disease: Secondary | ICD-10-CM | POA: Diagnosis not present

## 2018-06-10 DIAGNOSIS — Z8711 Personal history of peptic ulcer disease: Secondary | ICD-10-CM

## 2018-06-10 DIAGNOSIS — E785 Hyperlipidemia, unspecified: Secondary | ICD-10-CM | POA: Diagnosis not present

## 2018-06-10 DIAGNOSIS — I428 Other cardiomyopathies: Secondary | ICD-10-CM

## 2018-06-10 DIAGNOSIS — I5023 Acute on chronic systolic (congestive) heart failure: Secondary | ICD-10-CM

## 2018-06-10 DIAGNOSIS — G4733 Obstructive sleep apnea (adult) (pediatric): Secondary | ICD-10-CM | POA: Diagnosis not present

## 2018-06-10 DIAGNOSIS — R7303 Prediabetes: Secondary | ICD-10-CM | POA: Diagnosis present

## 2018-06-10 DIAGNOSIS — I10 Essential (primary) hypertension: Secondary | ICD-10-CM

## 2018-06-10 DIAGNOSIS — I472 Ventricular tachycardia: Secondary | ICD-10-CM | POA: Diagnosis not present

## 2018-06-10 DIAGNOSIS — F419 Anxiety disorder, unspecified: Secondary | ICD-10-CM | POA: Diagnosis not present

## 2018-06-10 DIAGNOSIS — K921 Melena: Secondary | ICD-10-CM | POA: Diagnosis not present

## 2018-06-10 DIAGNOSIS — R1032 Left lower quadrant pain: Secondary | ICD-10-CM | POA: Diagnosis not present

## 2018-06-10 DIAGNOSIS — I2510 Atherosclerotic heart disease of native coronary artery without angina pectoris: Secondary | ICD-10-CM | POA: Diagnosis not present

## 2018-06-10 DIAGNOSIS — I5043 Acute on chronic combined systolic (congestive) and diastolic (congestive) heart failure: Secondary | ICD-10-CM | POA: Diagnosis not present

## 2018-06-10 LAB — TROPONIN I
Troponin I: 0.05 ng/mL (ref <0.03)
Troponin I: 0.05 ng/mL (ref <0.03)

## 2018-06-10 LAB — CBC
HCT: 43.1 % (ref 39.0–52.0)
HCT: 48.4 % (ref 39.0–52.0)
Hemoglobin: 13.9 g/dL (ref 13.0–17.0)
Hemoglobin: 15.4 g/dL (ref 13.0–17.0)
MCH: 27.6 pg (ref 26.0–34.0)
MCH: 27.5 pg (ref 26.0–34.0)
MCHC: 32.3 g/dL (ref 30.0–36.0)
MCHC: 31.8 g/dL (ref 30.0–36.0)
MCV: 85.7 fL (ref 78.0–100.0)
MCV: 86.4 fL (ref 78.0–100.0)
Platelets: 132 10*3/uL — ABNORMAL LOW (ref 150–400)
Platelets: 148 10*3/uL — ABNORMAL LOW (ref 150–400)
RBC: 5.03 MIL/uL (ref 4.22–5.81)
RBC: 5.6 MIL/uL (ref 4.22–5.81)
RDW: 14.4 % (ref 11.5–15.5)
RDW: 14.5 % (ref 11.5–15.5)
WBC: 6.4 10*3/uL (ref 4.0–10.5)
WBC: 7.4 10*3/uL (ref 4.0–10.5)

## 2018-06-10 LAB — LIPID PANEL
Cholesterol: 152 mg/dL (ref 0–200)
HDL: 41 mg/dL (ref >40)
LDL Cholesterol: 97 mg/dL (ref 0–99)
Total CHOL/HDL Ratio: 3.7 RATIO
Triglycerides: 71 mg/dL (ref <150)
VLDL: 14 mg/dL (ref 0–40)

## 2018-06-10 LAB — BASIC METABOLIC PANEL
Anion gap: 12 (ref 5–15)
BUN: 16 mg/dL (ref 6–20)
CO2: 26 mmol/L (ref 22–32)
Calcium: 9.1 mg/dL (ref 8.9–10.3)
Chloride: 100 mmol/L (ref 98–111)
Creatinine, Ser: 1.47 mg/dL — ABNORMAL HIGH (ref 0.61–1.24)
GFR calc Af Amer: >60 mL/min (ref >60)
GFR calc non Af Amer: 52 mL/min — ABNORMAL LOW (ref >60)
Glucose, Bld: 100 mg/dL — ABNORMAL HIGH (ref 70–99)
Potassium: 3.9 mmol/L (ref 3.5–5.1)
Sodium: 138 mmol/L (ref 135–145)

## 2018-06-10 LAB — HIV ANTIBODY (ROUTINE TESTING W REFLEX): HIV Screen 4th Generation wRfx: NONREACTIVE

## 2018-06-10 LAB — HEMOGLOBIN A1C
Hgb A1c MFr Bld: 6.3 % — ABNORMAL HIGH (ref 4.8–5.6)
Mean Plasma Glucose: 134.11 mg/dL

## 2018-06-10 MED ORDER — ENSURE ENLIVE PO LIQD: 237.0000 mL | Freq: Two times a day (BID) | ORAL | Status: DC

## 2018-06-10 MED ORDER — CARVEDILOL 6.25 MG PO TABS: 6.2500 mg | ORAL_TABLET | Freq: Two times a day (BID) | ORAL | 0 refills | Status: DC

## 2018-06-10 MED ORDER — PANTOPRAZOLE SODIUM 40 MG PO TBEC
40.0000 mg | DELAYED_RELEASE_TABLET | Freq: Every day | ORAL | Status: DC
  Administered 2018-06-10: 40 mg via ORAL
  Filled 2018-06-10: qty 1

## 2018-06-10 MED ORDER — CARVEDILOL 6.25 MG PO TABS
6.2500 mg | ORAL_TABLET | Freq: Two times a day (BID) | ORAL | Status: DC
  Administered 2018-06-10: 6.25 mg via ORAL
  Filled 2018-06-10: qty 1

## 2018-06-10 MED ORDER — PANTOPRAZOLE SODIUM 40 MG PO TBEC: 40.0000 mg | DELAYED_RELEASE_TABLET | Freq: Every day | ORAL | 0 refills | Status: DC

## 2018-06-10 NOTE — Progress Notes (Signed)
Benefit check in progress for Entresto. B Zea Kostka RN,MHA,BSN [phone] 

## 2018-06-10 NOTE — Plan of Care (Signed)

## 2018-06-10 NOTE — Progress Notes (Addendum)
Progress Note  Patient Name: Bruce Robertson Date of Encounter: 06/10/2018  Primary Cardiologist: [name] MD  Subjective   Pt reports feeling better today. Ready to go home. Will have case manager follow for possible Entresto?   Inpatient Medications    Scheduled Meds: . albuterol  5 mg Nebulization Once  . carvedilol  6.25 mg Oral BID WC  . feeding supplement (ENSURE ENLIVE)  237 mL Oral BID BM  . pantoprazole  40 mg Oral Daily  . senna  1 tablet Oral BID  . simvastatin  20 mg Oral QHS  . spironolactone  12.5 mg Oral Daily  . vitamin B-12  1,000 mcg Oral Daily   Continuous Infusions: . sodium chloride Stopped (06/09/18 2321)   PRN Meds: acetaminophen **OR** acetaminophen, ondansetron **OR** ondansetron (ZOFRAN) IV   Vital Signs    Vitals:   06/10/18 0432 06/10/18 0550 06/10/18 0923 06/10/18 1135  BP: (!) 84/56  (!) 94/55 (!) 96/43  Pulse: (!) 51  60 (!) 50  Resp: 16   20  Temp: 97.6 F (36.4 C)   98.4 F (36.9 C)  TempSrc: Oral   Oral  SpO2: 97%  98% 96%  Weight:  219 lb 6.4 oz (99.5 kg)    Height:        Intake/Output Summary (Last 24 hours) at 06/10/2018 1145 Last data filed at 06/10/2018 0928 Gross per 24 hour  Intake 1008.93 ml  Output 3100 ml  Net -2091.07 ml   Filed Weights   06/09/18 0852 06/09/18 1556 06/10/18 0550  Weight: 235 lb (106.6 kg) 220 lb 4 oz (99.9 kg) 219 lb 6.4 oz (99.5 kg)   Physical Exam   General: Well developed, well nourished, NAD Skin: Warm, dry, intact  Head: Normocephalic, atraumatic, clear, moist mucus membranes. Neck: Negative for carotid bruits. No JVD Lungs:Clear to ausculation bilaterally. No wheezes, rales, or rhonchi. Breathing is unlabored. Cardiovascular: RRR with S1 S2. No murmurs, rubs or gallops Abdomen: Soft, non-tender, non-distended with normoactive bowel sounds. No obvious abdominal masses. MSK: Strength and tone appear normal for age. 5/5 in all extremities Extremities: No edema. No clubbing or  cyanosis. DP/PT pulses 2+ bilaterally Neuro: Alert and oriented. No focal deficits. No facial asymmetry. MAE spontaneously. Psych: Responds to questions appropriately with normal affect.    Labs    Chemistry Recent Labs  Lab 06/09/18 0928 06/10/18 0420  NA 140 138  K 4.3 3.9  CL 107 100  CO2 20* 26  GLUCOSE 99 100*  BUN 16 16  CREATININE 1.40* 1.47*  CALCIUM 9.4 9.1  GFRNONAA 55* 52*  GFRAA >60 >60  ANIONGAP 13 12    Hematology Recent Labs  Lab 06/09/18 0928 06/10/18 0420  WBC 8.4 6.4  RBC 5.26 5.03  HGB 14.5 13.9  HCT 45.5 43.1  MCV 86.5 85.7  MCH 27.6 27.6  MCHC 31.9 32.3  RDW 14.6 14.4  PLT 144* 132*   Cardiac Enzymes Recent Labs  Lab 06/09/18 0928 06/09/18 1643 06/10/18 0021 06/10/18 0420  TROPONINI 0.04* 0.04* 0.05* 0.05*    Recent Labs  Lab 06/09/18 1542  TROPIPOC 0.03    BNP Recent Labs  Lab 06/09/18 0928  BNP 764.0*    DDimer  Recent Labs  Lab 06/09/18 0928  DDIMER 1.21*    Radiology    Dg Chest 2 View  Result Date: 06/09/2018 CLINICAL DATA:  Chest pain, diaphoresis, dizziness began 2 days ago and has increased today. Near syncopal sensation. EXAM: CHEST -  2 VIEW COMPARISON:  PA and lateral chest x-ray of February 16, 2018 FINDINGS: The lungs are adequately inflated. The interstitial markings are increased. The pulmonary vascularity is engorged. The cardiac silhouette is top-normal in size. There is no pleural effusion. The ICD is in stable position. The patient has undergone previous CABG. The sternal wires appear intact. The retrosternal soft tissues are normal. The observed bony thorax is unremarkable. IMPRESSION: Findings worrisome for CHF with mild interstitial edema. No focal pneumonia. Electronically Signed   By: David  Martinique M.D.   On: 06/09/2018 09:13   Ct Angio Chest Pe W And/or Wo Contrast  Result Date: 06/09/2018 CLINICAL DATA:  Shortness of breath EXAM: CT ANGIOGRAPHY CHEST WITH CONTRAST TECHNIQUE: Multidetector CT imaging of  the chest was performed using the standard protocol during bolus administration of intravenous contrast. Multiplanar CT image reconstructions and MIPs were obtained to evaluate the vascular anatomy. CONTRAST:  66mL ISOVUE-370 IOPAMIDOL (ISOVUE-370) INJECTION 76% COMPARISON:  None. FINDINGS: Cardiovascular: Satisfactory opacification of the pulmonary arteries to the segmental level. No evidence of pulmonary embolism. Normal heart size. No pericardial effusion. Prior median sternotomy and CABG. Mediastinum/Nodes: No enlarged mediastinal, hilar, or axillary lymph nodes. Thyromegaly without focal abnormality. Trachea demonstrate no significant findings. Mild distal esophageal wall thickening as can be seen with mild esophagitis. Lungs/Pleura: Small bilateral pleural effusions. Mild bilateral interstitial thickening and patchy areas of ground-glass opacity. Upper Abdomen: No acute abnormality. Musculoskeletal: No chest wall abnormality. No acute or significant osseous findings. Review of the MIP images confirms the above findings. IMPRESSION: 1. No evidence of pulmonary embolus. 2. Mild pulmonary edema. Electronically Signed   By: Kathreen Devoid   On: 06/09/2018 13:34   Telemetry    06/10/18 SB HR 45- Personally Reviewed  ECG    No new tracing as of 06/09/18- Personally Reviewed  Cardiac Studies   Echo 04/01/18: Study Conclusions  - Left ventricle: The cavity size was severely dilated. Systolic function was severely reduced. The estimated ejection fraction was in the range of 20% to 25%. Diffuse hypokinesis. Features are consistent with a pseudonormal left ventricular filling pattern, with concomitant abnormal relaxation and increased filling pressure (grade 2 diastolic dysfunction). Doppler parameters are consistent with high ventricular filling pressure. - Aortic valve: There was mild regurgitation. - Mitral valve: Transvalvular velocity was within the normal range. There was no  evidence for stenosis. There was trivial regurgitation. - Left atrium: The atrium was mildly dilated. - Right ventricle: The cavity size was normal. Wall thickness was normal. Systolic function was mildly reduced. - Atrial septum: No defect or patent foramen ovale was identified by color flow Doppler. - Tricuspid valve: There was moderate regurgitation. - Pulmonary arteries: Systolic pressure was mildly increased. PA peak pressure: 44 mm Hg (S).  Patient Profile     57 y.o. male  with a hx of systolic CHF who is being seen today for the evaluation of pulmonary edema  at the request of Dr. Wendy Poet.  Assessment & Plan    1. Acute on chronic heart failure: -Per echocardiogram with LVEF of 20-25% in 03/2018 -Continue coreg, lisinopril, aldactone -[facility] benefit [facility] however will need to hold lisinopril for 48 hours prior to initiation>>will need case manager to assess for affordability -Per chart review, would start Entresto after 36 hours if able to afford -BP soft today making it difficult to up-titrate meds at this point 96/43>94/55>84/56>93/65>112/69 -Creatinine, 1.47 today. Appears to be about at baseline  -Weight, 219lb today, 235lb on admission  -I&O, net  negative 2.5L since admission -Strict I&O, daily weights   2. Bradycardia: -Pt noted to be bradycardic on telemetry review>>pt reports he has been bradycardiac for many years and has been on carvedilol without complication  -HR noted to 40-50's  -Will leave him on BB given his significant LV dysfunction and that he is asymptomatic   3. Hx of left main dissection: -s/p CABG -No anginal symptoms -Continue ASA, statin   4. CKD stage III: -Creatinine, 1.47 today -Appears to at baseline  -Avoid nephrotoxic medications   5. OSA on CPAP: -Reports compliance   6. NSVT s/p ICD: -On beta blocker -No arrhthymias per tele review  -Followed by Dr. Curt Bears  Signed, Kathyrn Drown NP-C [place] Pager:  [phone] 06/10/2018, 11:45 AM     For questions or updates, please contact   Please consult [email or web address] for contact info under Cardiology/STEMI.  Attending Note:   The patient was seen and examined.  Agree with assessment and plan as noted above.  Changes made to the above note as needed.  Patient seen and independently examined with Kathyrn Drown. NP .   We discussed all aspects of the encounter. I agree with the assessment and plan as stated above.  1.  Acute on chronic combined systolic and diastolic congestive heart failure: Agree that Entresto would be good. Lisinopril was just stopped yesterday  Advised him to ambulate in the halls.  If he does well, I think he could go home today  Would have him see the advanced Heart failure clinic ( he has been seen there in the past)  Plan on starting Entresto as OP. BP is low - I would not start Lasix yet.  He's on aldactone  Would have him weigh himself daily .    I have spent a total of 40 minutes with patient reviewing hospital  notes , telemetry, EKGs, labs and examining patient as well as establishing an assessment and plan that was discussed with the patient. > 50% of time was spent in direct patient care.     Thayer Headings, Brooke Bonito., MD, [facility] 06/10/2018, 2:40 PM [address],  [place] Pager [phone]

## 2018-06-10 NOTE — Care Management (Signed)
06-10-18  BENEFITS CHECK:   #  6.   PATIENT HAS MEDICAID  ACCESS            EFF-DATE- 09-09-2017            CO-PAY- $ 3.80 FOR EACH RX  ENTRESTO  24/26 MG BID   PREFERRED PHARMACY : YES    [facility]

## 2018-06-10 NOTE — Discharge Instructions (Signed)
You were admitted for heart failure exacerbation and improved with removal of extra fluid (diuresis). The dosages of some of your medications were changed so be sure to take the prescribed amount. You will follow up with Cardiology outpatient and start a new medication called Entresto. You should follow up with your primary doctor in a few days and discuss getting a colonoscopy and recheck some blood work.   Heart Failure Heart failure means your heart has trouble pumping blood. This makes it hard for your body to work well. Heart failure is usually a long-term (chronic) condition. You must take good care of yourself and follow your doctor's treatment plan. Follow these instructions at home:  Take your heart medicine as told by your doctor. ? Do not stop taking medicine unless your doctor tells you to. ? Do not skip any dose of medicine. ? Refill your medicines before they run out. ? Take other medicines only as told by your doctor or pharmacist.  Stay active if told by your doctor. The elderly and people with severe heart failure should talk with a doctor about physical activity.  Eat heart-healthy foods. Choose foods that are without trans fat and are low in saturated fat, cholesterol, and salt (sodium). This includes fresh or frozen fruits and vegetables, fish, lean meats, fat-free or low-fat dairy foods, whole grains, and high-fiber foods. Lentils and dried peas and beans (legumes) are also good choices.  Limit salt if told by your doctor.  Cook in a healthy way. Roast, grill, broil, bake, poach, steam, or stir-fry foods.  Limit fluids as told by your doctor.  Weigh yourself every morning. Do this after you pee (urinate) and before you eat breakfast. Write down your weight to give to your doctor.  Take your blood pressure and write it down if your doctor tells you to.  Ask your doctor how to check your pulse. Check your pulse as told.  Lose weight if told by your doctor.  Stop  smoking or chewing tobacco. Do not use gum or patches that help you quit without your doctor's approval.  Schedule and go to doctor visits as told.  Nonpregnant women should have no more than 1 drink a day. Men should have no more than 2 drinks a day. Talk to your doctor about drinking alcohol.  Stop illegal drug use.  Stay current with shots (immunizations).  Manage your health conditions as told by your doctor.  Learn to manage your stress.  Rest when you are tired.  If it is really hot outside: ? Avoid intense activities. ? Use air conditioning or fans, or get in a cooler place. ? Avoid caffeine and alcohol. ? Wear loose-fitting, lightweight, and light-colored clothing.  If it is really cold outside: ? Avoid intense activities. ? Layer your clothing. ? Wear mittens or gloves, a hat, and a scarf when going outside. ? Avoid alcohol.  Learn about heart failure and get support as needed.  Get help to maintain or improve your quality of life and your ability to care for yourself as needed. Contact a doctor if:  You gain weight quickly.  You are more short of breath than usual.  You cannot do your normal activities.  You tire easily.  You cough more than normal, especially with activity.  You have any or more puffiness (swelling) in areas such as your hands, feet, ankles, or belly (abdomen).  You cannot sleep because it is hard to breathe.  You feel like your heart is beating  fast (palpitations).  You get dizzy or light-headed when you stand up. Get help right away if:  You have trouble breathing.  There is a change in mental status, such as becoming less alert or not being able to focus.  You have chest pain or discomfort.  You faint. This information is not intended to replace advice given to you by your health care provider. Make sure you discuss any questions you have with your health care provider. Document Released: 07/30/2008 Document Revised:  03/28/2016 Document Reviewed: 12/07/2012 Elsevier Interactive Patient Education  2017 [employer].

## 2018-06-10 NOTE — Progress Notes (Addendum)
Discussed discharge information with patient, including follow up appointments and medications.  Called Bruce Robertson for Code 44 status.  She reported ok to discharge since family is at bedside.   Patient asked about when he may be starting entresto.  Paged MD  MD called back- he will be starting it in out patient clinic.  BP is a little low to start at this time.  Outpatient clinic will call to - may try to get in sooner. Patient decline a wheel chair.

## 2018-06-10 NOTE — Progress Notes (Signed)
Nutrition Brief Note  Patient identified on the Malnutrition Screening Tool (MST) Report  Wt Readings from Last 15 Encounters:  06/10/18 219 lb 6.4 oz (99.5 kg)  05/18/18 236 lb (107 kg)  03/16/18 245 lb 9.6 oz (111.4 kg)  03/10/18 240 lb (108.9 kg)  02/09/18 251 lb 9.6 oz (114.1 kg)  01/29/18 243 lb (110.2 kg)  12/10/17 242 lb (109.8 kg)  11/17/17 253 lb (114.8 kg)  06/24/17 250 lb 3.2 oz (113.5 kg)  03/21/17 246 lb 11.1 oz (111.9 kg)  02/11/17 241 lb (109.3 kg)  01/23/17 242 lb 12.8 oz (110.1 kg)  10/24/16 244 lb (110.7 kg)  10/08/16 241 lb 12.8 oz (109.7 kg)  08/08/16 236 lb 8 oz (107.3 kg)   Bruce Robertson is a 57 y.o. male presenting with SOB.  Medical history is significant for nonischemic cardiomyopathy (20 to 25% EF on 03/2018) ICD, S/P CABG, HLD, HTN, internal hemorrhoids, +THC use, obesity, GERD.   Pt admitted with heart failure.   Spoke with pt at bedside, who is in good spirits today. He reports he generally has a good appetite, however, has eating minimally over the past 3 days due to not feeling well. Pt typically consumes 2 meals per day (which consist of salads or fish with rice and vegetables). Pt admits to eating some pizza and chicken wings last week, however "this is usually not the case".   Pt is very active and participates in strength training at the gym 3-4 times per week. He has been active all of his life and has always been involved in some type of athletic sport.   He has lost weight intentionally over the past few years per recommendation of his cardiologist.   Nutrition-Focused physical exam completed. Findings are no fat depletion, no muscle depletion, and no edema.   Body mass index is 28.95 kg/m. Patient meets criteria for overweight based on current BMI.   Current diet order is Heart Healthy, patient is consuming approximately 75% of meals at this time. Labs and medications reviewed.   No nutrition interventions warranted at this time. If  nutrition issues arise, please consult RD.   Sophiana Milanese A. Jimmye Norman, RD, LDN, CDE Pager: [phone] After hours Pager: [phone]

## 2018-06-10 NOTE — Discharge Summary (Signed)
[facility] Discharge Summary  Patient name: Bruce Robertson Medical record number: [id number] Date of birth: 24-Jul-1961 Age: 57 y.o. Gender: male Date of Admission: 06/09/2018  Date of Discharge: 8/7 Admitting Physician: Blane Ohara McDiarmid, MD  Primary Care Provider: Darden Amber, PA Consultants: Cardiology  Indication for Hospitalization: SOB  Discharge Diagnoses/Problem List:  Acute decompensated heart failure Nausea/vomiting/left lower quadrant abdominal pain GI bleed Nonischemic cardiomyopathy with ICD-chronic CKD 3 Hypertension Hyperlipidemia  Disposition: Discharge home  Discharge Condition: Stable  Discharge Exam:  General: Alert and cooperative and appears to be in no acute distress HEENT: Neck non-tender without lymphadenopathy, masses or thyromegaly, normal JVD Cardio: Normal A1 and S2, no S3 or S4. RRR. No murmurs or rubs.   Pulm: Clear to auscultation bilaterally, no crackles, wheezing, or diminished breath sounds. Normal respiratory effort Abdomen: Bowel sounds normal. Abdomen soft and non-tender.  Extremities: No peripheral edema. Warm/ well perfused.  Strong radial pulses. No appreciable edema in hands. Neuro: Cranial nerves grossly intact  Brief Hospital Course:  Bruce Robertson a 57 y.o.malepresenting with SOB. Medical history is significant for nonischemic cardiomyopathy (20 to 25% EF on 03/2018) ICD, S/P CABG, HLD, HTN, internal hemorrhoids, +THC use, obesity, GERD.   On presentation patient was experiencing significant shortness of breath, nausea, vomiting, possible bloody bowel movements.  Patient was admitted with a primary diagnosis of acute decompensated heart failure.  He was diuresed with IV Lasix 60 mg which yielded a roughly 16 pound weight loss overnight.  The following morning he felt "100% better".  In the morning he denies shortness of breath his nausea had also abated and he was able to keep down his breakfast.  He  had no bowel movements during his hospitalization to assess for occult blood but his hemoglobin remained stable around 14 with a previous baseline of 14.  On 8/7 he was resumed at his home medications with the following changes: -Carvedilol was decreased to 6.25 due to heart rates in the 50s -Lisinopril was held due to transition to Truett Perna soon to be started by cardiology outpatient    Issues for Follow Up:  1. Follow-up with cardiology regarding his hospitalization and the initiation of new medication.  Please ensure at least 2 days between stopping lisinopril and starting Entresto.  Please assess patient's fluid status at this time.   2. Please follow-up with your PCP regarding potential GI bleed.  On admission patient reported bloody bowel movements but no bowel movements were had in hospital and FOBT was not done.  Work-up during hospitalization was low priority due to stable vital signs and stable hemoglobin levels at around 14.  Patient is also due for colonoscopy as polyps were found on his previous colonoscopy over 5 years ago.  Please ensure that patient is appropriate follow-up. 3. Follow-up with PCP regarding appropriate nutrition.  On discharge patient had many questions regarding healthy eating habits.  He appeared to have no understanding and appreciation of his medical condition and desire to start a more healthy lifestyle.  While we were able to have a brief conversation about basic nutrition further information would be helpful.  Significant Procedures: none  Significant Labs and Imaging:  Recent Labs  Lab 06/09/18 0928 06/10/18 0420 06/10/18 1505  WBC 8.4 6.4 7.4  HGB 14.5 13.9 15.4  HCT 45.5 43.1 48.4  PLT 144* 132* 148*   Recent Labs  Lab 06/09/18 0928 06/10/18 0420  NA 140 138  K 4.3 3.9  CL 107 100  CO2 20* 26  GLUCOSE 99 100*  BUN 16 16  CREATININE 1.40* 1.47*  CALCIUM 9.4 9.1   Dg Chest 2 View  Result Date: 06/09/2018 CLINICAL DATA:  Chest  pain, diaphoresis, dizziness began 2 days ago and has increased today. Near syncopal sensation. EXAM: CHEST - 2 VIEW COMPARISON:  PA and lateral chest x-ray of February 16, 2018 FINDINGS: The lungs are adequately inflated. The interstitial markings are increased. The pulmonary vascularity is engorged. The cardiac silhouette is top-normal in size. There is no pleural effusion. The ICD is in stable position. The patient has undergone previous CABG. The sternal wires appear intact. The retrosternal soft tissues are normal. The observed bony thorax is unremarkable. IMPRESSION: Findings worrisome for CHF with mild interstitial edema. No focal pneumonia. Electronically Signed   By: David  Martinique M.D.   On: 06/09/2018 09:13   Ct Angio Chest Pe W And/or Wo Contrast  Result Date: 06/09/2018 CLINICAL DATA:  Shortness of breath EXAM: CT ANGIOGRAPHY CHEST WITH CONTRAST TECHNIQUE: Multidetector CT imaging of the chest was performed using the standard protocol during bolus administration of intravenous contrast. Multiplanar CT image reconstructions and MIPs were obtained to evaluate the vascular anatomy. CONTRAST:  66mL ISOVUE-370 IOPAMIDOL (ISOVUE-370) INJECTION 76% COMPARISON:  None. FINDINGS: Cardiovascular: Satisfactory opacification of the pulmonary arteries to the segmental level. No evidence of pulmonary embolism. Normal heart size. No pericardial effusion. Prior median sternotomy and CABG. Mediastinum/Nodes: No enlarged mediastinal, hilar, or axillary lymph nodes. Thyromegaly without focal abnormality. Trachea demonstrate no significant findings. Mild distal esophageal wall thickening as can be seen with mild esophagitis. Lungs/Pleura: Small bilateral pleural effusions. Mild bilateral interstitial thickening and patchy areas of ground-glass opacity. Upper Abdomen: No acute abnormality. Musculoskeletal: No chest wall abnormality. No acute or significant osseous findings. Review of the MIP images confirms the above  findings. IMPRESSION: 1. No evidence of pulmonary embolus. 2. Mild pulmonary edema. Electronically Signed   By: Kathreen Devoid   On: 06/09/2018 13:34     Results/Tests Pending at Time of Discharge:  none  Discharge Medications:  Allergies as of 06/10/2018   No Known Allergies     Medication List    STOP taking these medications   lisinopril 2.5 MG tablet Commonly known as:  PRINIVIL,ZESTRIL     TAKE these medications   carvedilol 6.25 MG tablet Commonly known as:  COREG Take 1 tablet (6.25 mg total) by mouth 2 (two) times daily with a meal. What changed:    medication strength  how much to take   furosemide 40 MG tablet Commonly known as:  LASIX Take 40 mg by mouth.   pantoprazole 40 MG tablet Commonly known as:  PROTONIX Take 1 tablet (40 mg total) by mouth daily. Start taking on:  06/11/2018   simvastatin 20 MG tablet Commonly known as:  ZOCOR TAKE ONE TABLET BY MOUTH AT BEDTIME   spironolactone 25 MG tablet Commonly known as:  ALDACTONE Take 12.5 mg by mouth daily.   vitamin B-12 1000 MCG tablet Commonly known as:  CYANOCOBALAMIN Take 1,000 mcg by mouth daily.       Discharge Instructions: Please refer to Patient Instructions section of EMR for full details.  Patient was counseled important signs and symptoms that should prompt return to medical care, changes in medications, dietary instructions, activity restrictions, and follow up appointments.   Follow-Up Appointments: Follow-up Information    Darden Amber, [place]. Go on 06/18/2018.   Why:  @10 :00am Contact information: [address]  [place] [address] [phone]           Matilde Haymaker, MD 06/10/2018, 8:40 PM PGY-1, [facility]

## 2018-06-11 ENCOUNTER — Telehealth (HOSPITAL_COMMUNITY): Payer: Self-pay | Admitting: *Deleted

## 2018-06-11 NOTE — Telephone Encounter (Signed)
-----   Message from Tommie Raymond, NP sent at 06/10/2018  3:09 PM EDT ----- Regarding: HF clinic follow up appointment  This pt will be discharged from the hospital today, 06/10/18 and Dr. Acie Fredrickson is asking if we can get him a follow up appointment in the HF clinic in the next few weeks for medication adjustments.   Thank you Kathyrn Drown NP-[facility]

## 2018-06-11 NOTE — Telephone Encounter (Signed)
Left VM for pt to call back to schedule hospital f/u. Ok to see APP clinic.

## 2018-06-23 ENCOUNTER — Ambulatory Visit (INDEPENDENT_AMBULATORY_CARE_PROVIDER_SITE_OTHER): Payer: Medicare Other | Admitting: *Deleted

## 2018-06-23 ENCOUNTER — Telehealth: Payer: Self-pay | Admitting: Cardiology

## 2018-06-23 DIAGNOSIS — I428 Other cardiomyopathies: Secondary | ICD-10-CM | POA: Diagnosis not present

## 2018-06-23 NOTE — Telephone Encounter (Signed)
Spoke with pt and reminded pt of remote transmission that is due today. Pt verbalized understanding.   

## 2018-06-24 NOTE — Progress Notes (Signed)
Remote ICD transmission.   

## 2018-07-02 ENCOUNTER — Ambulatory Visit (INDEPENDENT_AMBULATORY_CARE_PROVIDER_SITE_OTHER): Payer: Medicare Other | Admitting: Cardiovascular Disease

## 2018-07-02 ENCOUNTER — Encounter: Payer: Self-pay | Admitting: Cardiovascular Disease

## 2018-07-02 VITALS — BP 112/64 | HR 53 | Ht 73.0 in | Wt 224.6 lb

## 2018-07-02 DIAGNOSIS — I428 Other cardiomyopathies: Secondary | ICD-10-CM | POA: Diagnosis not present

## 2018-07-02 DIAGNOSIS — I10 Essential (primary) hypertension: Secondary | ICD-10-CM

## 2018-07-02 DIAGNOSIS — I420 Dilated cardiomyopathy: Secondary | ICD-10-CM

## 2018-07-02 DIAGNOSIS — G4731 Primary central sleep apnea: Secondary | ICD-10-CM | POA: Diagnosis not present

## 2018-07-02 DIAGNOSIS — Z95810 Presence of automatic (implantable) cardiac defibrillator: Secondary | ICD-10-CM

## 2018-07-02 NOTE — Patient Instructions (Signed)
Follow-Up: Your physician wants you to follow-up in: 1 year with Dr. Claiborne Billings (sleep clinic). You will receive a reminder letter in the mail two months in advance. If you don't receive a letter, please call our office to schedule the follow-up appointment.   Any Other Special Instructions Will Be Listed Below (If Applicable).  We have made changes to your Bipap pressures-we will obtain download in 2 weeks to reassess.      If you need a refill on your cardiac medications before your next appointment, please call your pharmacy.

## 2018-07-02 NOTE — Progress Notes (Signed)
Bruce Robertson   [birth year]  [id number]  Primary Physician No PCP Per Patient Primary Cardiologist: Dr [name]; Charlcie Cradle)  HPI: Bruce Robertson is a 57 y.o. male who presents for f/u  sleep evaluation.  I had seen him in September 2017 for initial evaluation following institution of CPAP therapy.  He was referred back to sleep clinic per Dr. [name] , and I saw him on 01/29/2018.  He completed a BiPAP titration.  I last saw him in May 2019.  He presents for follow-up evaluation.  Bruce Robertson has a history of a nonischemic cardiomyopathy.  He had issues with significant daytime sleepiness, fatigability, and snoring, and was referred for a sleep study which on 03/10/2016 in a split-night protocol.  This confirmed moderate obstructive sleep apnea overall with an AHI of 26.7.  However, the events were severe supine position with an AHI of 43.3.   Oxygen desaturated  to a nadir of 88%.  There was evidence for soft snoring.  He initially was titrated up to 9 cm water pressure.  His set up date was 05/06/2016.  A download from 06/03/2016 through 07/02/2016 demonstrated compliance with reference to days of usage being 77%.  However, he was noncompliant with usage greater than 4 hours, such that he had only 43% of days reaching this threshold.  His average usage on days used was 4 hours and 24 minutes.  His AHI was 6.3. When I saw him for initial evaluation he was still sleepy and at times did not feel as if he was getting adequate air.  With his positional component with severe sleep apnea I changed him to an auto modewith supine sleep which would allow for increased pressures with supine posture adjustment  When I initially saw him he still had residual daytime sleepiness as assessed by his Epworth sleepiness scale score of 11, which is outlined below.   Epworth Sleepiness Scale: Situation   Chance of Dozing/Sleeping (0 = never , 1 = slight chance , 2 = moderate chance , 3 = high chance )   sitting and reading 2   watching TV 2   sitting inactive in a public place 1   being a passenger in a motor vehicle for an hour or more 2   lying down in the afternoon 3   sitting and talking to someone 0   sitting quietly after lunch (no alcohol) 1   while stopped for a few minutes in traffic as the driver 0   Total Score  11   Since I initially saw him he  developed worsening heart function and EF had dropped to 20 to 25%.  His medications were titrated and he underwent ICD implantation.  Apparently, he is only been intermittently using CPAP.  He is referred back to sleep clinic for further evaluation.An Epworth Sleepiness Scale score was calculated which endorsed at 16, as shown below.   Epworth Sleepiness Scale: Situation   Chance of Dozing/Sleeping (0 = never , 1 = slight chance , 2 = moderate chance , 3 = high chance )   sitting and reading 3   watching TV 3   sitting inactive in a public place 2   being a passenger in a motor vehicle for an hour or more 3   lying down in the afternoon 3   sitting and talking to someone 0   sitting quietly after lunch (no alcohol) 1   while stopped for a few minutes in traffic as the driver  1   Total Score  16   A new download was obtained in the office  from December 29, 2017 through January 27, 2018.  He was noncompliant.  Usage days was only 16 out of 30 (53%) and usage greater than 4 hours was only 8 days (27%).  AHI is was 23 and is consistent with moderate sleep apnea.  Apnea index was 21.5 with an hypopnea index of 1.5.  Central apnea index was 10.9/h. When I saw him on 01/29/2018 for evaluation, had a long discussion with him regarding the importance of therapy.  I was concerned that with his reduced LV function.  If his volume status is elevated, he may be developing central apneic events.  In addition to obstructive events.  He underwent a BiPAP titration study on 03/10/2018.  Sleep efficiency was 79.7%.  This was a suboptimal titration.  BiPAP  was implemented at 8 over 4, and he was titrated up to 17/13.  At low level pressures, he had rare central events, but as bilevel pressure increased above 12/8, there was significant increase in the number of central apneic events.  His AHI at 10/6 was 0 with an RDI of 10.7, but he only had 11 minutes of sleep.  At 12/8.  AHI was 7.7, RDI 23.2.  He did not have any central events and had 4 red hours.  Oxygen saturation was 93 and 94% during non-REM and REM  Sleep. AHI was significantly worse at higher BiPAP pressures.  I last saw him in May 2019.  At time I reviewed his BiPAP titration.  Based on his LV function presently I cannot initiate ASV therapy which would be most likely beneficial for him.  In the serve heart failure trial, which used an old ResMed ASV unit, there was deteriorated deleterious potential outcome.  However, this study was significantly flawed.  A new study the advanced heart failure trial with the Respironics ASV therapy with significant additional allergy has been underway and may be promising for ultimate changing him to ASV titration.  I had seen him, I discussed initiation of Entresto but due to his blood pressure being low is unable to initiate this therapy.  A new download was obtained from June 01, 2018 through June 30, 2018.  He currently has been set on a minimum EPAP pressure of 4 with a maximum IPAP pressure of 12 with a pressure support of 4.  AHI remains elevated at 29 with an apnea index of 28.  He also had a central apnea index of 18.2.  During this.  He had been hospitalized for several days with heart failure at the beginning of August which may have contributed to his increased central apnea due to increased volume.  He tells me during his hospitalization he lost over 92 pounds of fluid.  Presently he is breathing well.  He denies volume overload.  He presents for evaluation.  Past Medical History:  Diagnosis Date  . AICD (automatic cardioverter/defibrillator)  present   . Anxiety   . Cardiomyopathy    Nonischemic. EF has been about 45%. S/P CABG.;  b.  Echo 4/14: EF 25%, global HK with inf and mid apical AK, restrictive physiology with E/e' > 15 (elevated LV filling pressure), trivial AI/MR, mild to mod LAE, mild RVE, mild reduced RVSF, mild RAE, mod TR, PASP 74 (severe pulmonary HTN)   . CHF (congestive heart failure) ([place])   . CHF exacerbation ([place]) 07/31/2016  . Colon polyps 2002  . Coronary  artery disease   . Depression   . Dyslipidemia    hx (03/20/2017)  . GERD (gastroesophageal reflux disease)   . History of bleeding peptic ulcer   . History of echocardiogram    Echo 10/16:  EF 25-30%, poss non-compaction, diff HK with inf-lat and apical HK, restrictive physio, mild AI, severe LAE, mild RVE with mild reduced RVSF, PASP 42 mmHg  . Hyperplastic rectal polyp   . OSA on CPAP    Mild  . Tobacco abuse    Remote    Past Surgical History:  Procedure Laterality Date  . CARDIAC CATHETERIZATION  2007;  2009  . CARDIAC DEFIBRILLATOR PLACEMENT  03/20/2017  . CORONARY ARTERY BYPASS GRAFT  2007   Had a left main dissection after catheterization. Underwent a saphenous vein graft to the LAD and a saphenous vein graft to obtuse marginal.  . ICD IMPLANT N/A 03/20/2017   Procedure: ICD Implant;  Surgeon: Constance Haw, MD;  Location: [facility];  Service: Cardiovascular;  Laterality: N/A;  . PATELLAR TENDON REPAIR Right 2005    No Known Allergies  Current Outpatient Medications  Medication Sig Dispense Refill  . carvedilol (COREG) 6.25 MG tablet Take 1 tablet (6.25 mg total) by mouth 2 (two) times daily with a meal. 30 tablet 0  . furosemide (LASIX) 40 MG tablet Take 40 mg by mouth.    . pantoprazole (PROTONIX) 40 MG tablet Take 1 tablet (40 mg total) by mouth daily. 30 tablet 0  . simvastatin (ZOCOR) 20 MG tablet TAKE ONE TABLET BY MOUTH AT BEDTIME 90 tablet 3  . spironolactone (ALDACTONE) 25 MG tablet Take 12.5 mg by mouth daily.     . vitamin B-12 (CYANOCOBALAMIN) 1000 MCG tablet Take 1,000 mcg by mouth daily.     No current facility-administered medications for this visit.     Social History   Socioeconomic History  . Marital status: Divorced    Spouse name: Not on file  . Number of children: Not on file  . Years of education: Not on file  . Highest education level: Not on file  Occupational History  . Occupation: Hydrologist: [employer]  . Financial resource strain: Not on file  . Food insecurity:    Worry: Not on file    Inability: Not on file  . Transportation needs:    Medical: Not on file    Non-medical: Not on file  Tobacco Use  . Smoking status: Former Smoker    Packs/day: 0.10    Years: 7.00    Pack years: 0.70    Types: Cigarettes  . Smokeless tobacco: Never Used  . Tobacco comment: 03/20/2017 "quit before 2000"  Substance and Sexual Activity  . Alcohol use: Yes    Alcohol/week: 0.0 standard drinks    Comment: 03/20/2017 "might have 6 drinks/year @ the most"  . Drug use: Yes    Types: Marijuana    Comment: 03/20/2017 "joint qod"  . Sexual activity: Not Currently  Lifestyle  . Physical activity:    Days per week: Not on file    Minutes per session: Not on file  . Stress: Not on file  Relationships  . Social connections:    Talks on phone: Not on file    Gets together: Not on file    Attends religious service: Not on file    Active member of club or organization: Not on file    Attends meetings of clubs  or organizations: Not on file    Relationship status: Not on file  . Intimate partner violence:    Fear of current or ex partner: Not on file    Emotionally abused: Not on file    Physically abused: Not on file    Forced sexual activity: Not on file  Other Topics Concern  . Not on file  Social History Narrative   Single    Family History  Problem Relation Age of Onset  . Hypertension Mother   . Osteoarthritis Mother   . Heart failure  Father   . Lung cancer Other   . Cancer Other      ROS General: Negative; No fevers, chills, or night sweats HEENT: Negative; No changes in vision or hearing, sinus congestion, difficulty swallowing Pulmonary: Negative; No cough, wheezing, shortness of breath, hemoptysis Cardiovascular: Positive for a nonischemic cardiomyopathy.  Hospitalization in early August 2019 with volume overload with aggressive diuresis of 22 pounds. GI: Negative; No nausea, vomiting, diarrhea, or abdominal pain GU: Negative; No dysuria, hematuria, or difficulty voiding Musculoskeletal: Negative; no myalgias, joint pain, or weakness Hematologic: Negative; no easy bruising, bleeding Endocrine: Negative; no heat/cold intolerance Neuro: Negative; no changes in balance, headaches Skin: Negative; No rashes or skin lesions Psychiatric: Negative; No behavioral problems, depression Sleep: Positive for sleep apnea as noted above with daytime sleepiness, hypersomnolence; no bruxism, restless legs, hypnogognic hallucinations, no cataplexy   Physical Exam BP 112/64   Pulse (!) 53   Ht 6' 1"  (1.854 m)   Wt 224 lb 9.6 oz (101.9 kg)   SpO2 97%   BMI 29.63 kg/m    Repeat blood pressure by me was 100/64  Wt Readings from Last 3 Encounters:  07/02/18 224 lb 9.6 oz (101.9 kg)  06/10/18 219 lb 6.4 oz (99.5 kg)  05/18/18 236 lb (107 kg)   General: Alert, oriented, no distress.  Skin: normal turgor, no rashes, warm and dry HEENT: Normocephalic, atraumatic. Pupils equal round and reactive to light; sclera anicteric; extraocular muscles intact;  Nose without nasal septal hypertrophy Mouth/Parynx benign; Mallinpatti scale 3 Neck: JVD at 7-8 cm;, no carotid bruits; normal carotid upstroke Lungs: clear to ausculatation and percussion; no wheezing or rales Chest wall: without tenderness to palpitation Heart: PMI not displaced, RRR, s1 s2 normal, 1/6 systolic murmur, no diastolic murmur, no rubs, gallops, thrills, or  heaves Abdomen: soft, nontender; no hepatosplenomehaly, BS+; abdominal aorta nontender and not dilated by palpation. Back: no CVA tenderness Pulses 2+ Musculoskeletal: full range of motion, normal strength, no joint deformities Extremities: no clubbing cyanosis or edema, Homan's sign negative  Neurologic: grossly nonfocal; Cranial nerves grossly wnl Psychologic: Normal mood and affect   ECG (independently read by me): sinus bradycardia 57 bpm.  PVC.  Low voltage.  QTc interval 451 ms.  September 2017 ECG (independently read by me): Not done, but his ECG from 02/19/2016 was reviewed and reveals sinus bradycardia at 56, left posterior hemiblock, lateral T-wave changes, and small Q waves in lead 3 and aVF.  LABS:  BMP Latest Ref Rng & Units 06/10/2018 06/09/2018 11/24/2017  Glucose 70 - 99 mg/dL 100(H) 99 85  BUN 6 - 20 mg/dL 16 16 16   Creatinine 0.61 - 1.24 mg/dL 1.47(H) 1.40(H) 1.51(H)  BUN/Creat Ratio 9 - 20 - - 11  Sodium 135 - 145 mmol/L 138 140 146(H)  Potassium 3.5 - 5.1 mmol/L 3.9 4.3 4.5  Chloride 98 - 111 mmol/L 100 107 104  CO2 22 - 32 mmol/L 26 20(L)  27  Calcium 8.9 - 10.3 mg/dL 9.1 9.4 10.0     Hepatic Function Latest Ref Rng & Units 08/01/2016 04/29/2015 05/17/2010  Total Protein 6.5 - 8.1 g/dL 6.8 6.5 7.0  Albumin 3.5 - 5.0 g/dL 3.5 3.8 4.1  AST 15 - 41 U/L 20 30 34  ALT 17 - 63 U/L 25 28 28   Alk Phosphatase 38 - 126 U/L 46 40 41  Total Bilirubin 0.3 - 1.2 mg/dL 1.5(H) 0.3 0.8  Bilirubin, Direct 0.0 - 0.3 mg/dL - - 0.1     CBC Latest Ref Rng & Units 06/10/2018 06/10/2018 06/09/2018  WBC 4.0 - 10.5 K/uL 7.4 6.4 8.4  Hemoglobin 13.0 - 17.0 g/dL 15.4 13.9 14.5  Hematocrit 39.0 - 52.0 % 48.4 43.1 45.5  Platelets 150 - 400 K/uL 148(L) 132(L) 144(L)     Lipid Panel     Component Value Date/Time   CHOL 152 06/10/2018 0420   TRIG 71 06/10/2018 0420   HDL 41 06/10/2018 0420   CHOLHDL 3.7 06/10/2018 0420   VLDL 14 06/10/2018 0420   LDLCALC 97 06/10/2018 0420   LDLDIRECT  166.4 05/17/2010 0905     RADIOLOGY: Dg Chest 2 View  Result Date: 06/09/2018 CLINICAL DATA:  Chest pain, diaphoresis, dizziness began 2 days ago and has increased today. Near syncopal sensation. EXAM: CHEST - 2 VIEW COMPARISON:  PA and lateral chest x-ray of February 16, 2018 FINDINGS: The lungs are adequately inflated. The interstitial markings are increased. The pulmonary vascularity is engorged. The cardiac silhouette is top-normal in size. There is no pleural effusion. The ICD is in stable position. The patient has undergone previous CABG. The sternal wires appear intact. The retrosternal soft tissues are normal. The observed bony thorax is unremarkable. IMPRESSION: Findings worrisome for CHF with mild interstitial edema. No focal pneumonia. Electronically Signed   By: David  Martinique M.D.   On: 06/09/2018 09:13   Ct Angio Chest Pe W And/or Wo Contrast  Result Date: 06/09/2018 CLINICAL DATA:  Shortness of breath EXAM: CT ANGIOGRAPHY CHEST WITH CONTRAST TECHNIQUE: Multidetector CT imaging of the chest was performed using the standard protocol during bolus administration of intravenous contrast. Multiplanar CT image reconstructions and MIPs were obtained to evaluate the vascular anatomy. CONTRAST:  94m ISOVUE-370 IOPAMIDOL (ISOVUE-370) INJECTION 76% COMPARISON:  None. FINDINGS: Cardiovascular: Satisfactory opacification of the pulmonary arteries to the segmental level. No evidence of pulmonary embolism. Normal heart size. No pericardial effusion. Prior median sternotomy and CABG. Mediastinum/Nodes: No enlarged mediastinal, hilar, or axillary lymph nodes. Thyromegaly without focal abnormality. Trachea demonstrate no significant findings. Mild distal esophageal wall thickening as can be seen with mild esophagitis. Lungs/Pleura: Small bilateral pleural effusions. Mild bilateral interstitial thickening and patchy areas of ground-glass opacity. Upper Abdomen: No acute abnormality. Musculoskeletal: No chest wall  abnormality. No acute or significant osseous findings. Review of the MIP images confirms the above findings. IMPRESSION: 1. No evidence of pulmonary embolus. 2. Mild pulmonary edema. Electronically Signed   By: HKathreen Devoid  On: 06/09/2018 13:34    IMPRESSION:  1. Complex sleep apnea syndrome   2. Nonischemic cardiomyopathy ([place]   3. S/P ICD (internal cardiac defibrillator) procedure   4. Dilated cardiomyopathy ([place]   5. Essential hypertension      ASSESSMENT AND PLAN: Bruce Robertson a 57year old African-American male who has a history of a cardiomyopathy.  In the past his ejection fraction was in the 40-45% range, but this has further reduced to approximately 25%.  In 2017  he was diagnosed with  moderately severe sleep apnea with an overall AHI of 26.7.  However, there was a significant positional component with supine sleep AHI at 43.3 per hour, consistent with severe sleep apnea with supine posture.  A download at that time demonstrated  an AHI of 6.3 at 9 cm water pressure.  I adjusted him to an auto mode due to the severity of his sleep apnea with supine sleep and the need for increased pressure requirements.  He is now status post ICD implantation for his significant LV dysfunction.  When I saw him I was concerned that with his LV dysfunction he may be also experiencing central events.  I  personally reviewed the BiPAP sonogram.  He clearly has frequent central events which seem to increase at BiPAP pressures greater than 12/8.  Ideally with his central events he would be a candidate for ASV titration.  However with his severe LV dysfunction that previously has been demonstrated based on most recent recommendations which used old equipment this should not be prescribed in patients with significant LV dysfunction.  He underwent a follow-up echo Doppler study Apr 01, 2018 which again confirmed severe reduction of LV function with EF of 20 to 25%.  In August he was recently hospitalized  with heart failure exacerbation which responded to increased diuretic regimen with weight loss of 22 pounds.  I reviewed his most recent download of July 29 through June 30, 2018.  His AHI was 29.  However, this download was inclusive of the time that he was rehospitalized with CHF.  Presently, I will change his pressure support to 6, increase his maximum IPAP pressure to 14 and will continue with a minimum EPAP pressure of 4.  Hopefully the advanced heart failure trial will be published soon I believe will show benefit at present only the 42 old ASV unit is contraindicated but based on current guidelines I will wait for the advanced heart failure trial to be completed prior to consideration of possible switching to new ASV models.  I will obtain a download in 2 weeks to make certain the above therapy is beneficial.  If continuation of central events occur since we cannot use ASV it may be possible to consider switching him to a BiPAP the mode to enable treatment of his central events.  I will see him in several months for follow-up evaluation.    Troy Sine, MD, [facility]  07/04/2018 10:14 PM

## 2018-07-04 ENCOUNTER — Encounter: Payer: Self-pay | Admitting: Cardiovascular Disease

## 2018-07-10 ENCOUNTER — Other Ambulatory Visit: Payer: Self-pay | Admitting: Family Medicine

## 2018-07-27 LAB — CUP PACEART REMOTE DEVICE CHECK
Battery Remaining Longevity: 129 mo
Battery Voltage: 3.01 V
Brady Statistic RV Percent Paced: 0.43 %
Date Time Interrogation Session: 20190820225756
HighPow Impedance: 57 Ohm
Implantable Lead Implant Date: 20180517
Implantable Lead Location: 753860
Implantable Pulse Generator Implant Date: 20180517
Lead Channel Impedance Value: 361 Ohm
Lead Channel Impedance Value: 456 Ohm
Lead Channel Pacing Threshold Amplitude: 0.625 V
Lead Channel Pacing Threshold Pulse Width: 0.4 ms
Lead Channel Sensing Intrinsic Amplitude: 11.25 mV
Lead Channel Sensing Intrinsic Amplitude: 11.25 mV
Lead Channel Setting Pacing Amplitude: 2.5 V
Lead Channel Setting Pacing Pulse Width: 0.4 ms
Lead Channel Setting Sensing Sensitivity: 0.3 mV

## 2018-07-29 ENCOUNTER — Other Ambulatory Visit (HOSPITAL_COMMUNITY): Payer: Self-pay | Admitting: Cardiology

## 2018-07-29 NOTE — Telephone Encounter (Signed)
This is Dr. Kelly's pt. °

## 2018-07-29 NOTE — Telephone Encounter (Signed)
Rx request sent to pharmacy.  

## 2018-08-27 ENCOUNTER — Ambulatory Visit: Payer: Medicare Other | Admitting: Cardiovascular Disease

## 2018-09-04 DIAGNOSIS — J189 Pneumonia, unspecified organism: Secondary | ICD-10-CM

## 2018-09-04 HISTORY — DX: Pneumonia, unspecified organism: J18.9

## 2018-09-10 DIAGNOSIS — H524 Presbyopia: Secondary | ICD-10-CM | POA: Diagnosis not present

## 2018-09-14 ENCOUNTER — Other Ambulatory Visit: Payer: Self-pay | Admitting: Cardiology

## 2018-09-15 ENCOUNTER — Other Ambulatory Visit: Payer: Self-pay | Admitting: Cardiology

## 2018-09-15 ENCOUNTER — Other Ambulatory Visit: Payer: Self-pay

## 2018-09-15 MED ORDER — FUROSEMIDE 40 MG PO TABS: 40.0000 mg | ORAL_TABLET | Freq: Every day | ORAL | 8 refills | Status: DC

## 2018-09-15 NOTE — Telephone Encounter (Signed)
Follow up:   Patient calling back concerning refill. Patient states that the medication has not been filled.

## 2018-09-15 NOTE — Telephone Encounter (Signed)
°*  STAT* If patient is at the pharmacy, call can be transferred to refill team.   1. Which medications need to be refilled? (please list name of each medication and dose if known)   carvedilol (COREG) 6.25 MG tablet  spironolactone (ALDACTONE) 25 MG tablet  furosemide (LASIX) 40 MG tablet   2. Which pharmacy/location (including street and city if local pharmacy) is medication to be sent to?   [facility] ([place]), [place] - [address]  3. Do they need a 30 day or 90 day supply? 30 days  Patient states he is out of his fluid pills

## 2018-09-16 ENCOUNTER — Other Ambulatory Visit (HOSPITAL_COMMUNITY): Payer: Self-pay | Admitting: Cardiology

## 2018-09-16 MED ORDER — CARVEDILOL 6.25 MG PO TABS: 6.2500 mg | ORAL_TABLET | Freq: Two times a day (BID) | ORAL | 0 refills | Status: DC

## 2018-09-16 MED ORDER — SPIRONOLACTONE 25 MG PO TABS: ORAL_TABLET | ORAL | 0 refills | Status: DC

## 2018-09-16 MED ORDER — FUROSEMIDE 40 MG PO TABS: ORAL_TABLET | ORAL | 0 refills | Status: DC

## 2018-09-16 NOTE — Telephone Encounter (Signed)
Returned call to patient.He stated he needed refills on medication listed below.Advised he is past due to be seen.Stated he has been sob, coughing thick yellow phlegm for appox 1 week.Appointment was offered with PA this afternoon,but refused.Stated he feels too bad having flu like symptoms.Appointment scheduled with Jory Sims DNP 09/21/18 at 2:00 pm.Refills sent to pharmacy.

## 2018-09-16 NOTE — Telephone Encounter (Signed)
Follow up      *STAT* If patient is at the pharmacy, call can be transferred to refill team.   1. Which medications need to be refilled? (please list name of each medication and dose if known) spironolactone (ALDACTONE) 25 MG tablet  And carvedilol (COREG) 6.25 MG tablet    2. Which pharmacy/location (including street and city if local pharmacy) is medication to be sent to? [facility] ([place]), [place] - [address]  3. Do they need a 30 day or 90 day supply? 30   Per Malachy Mood okay to send to Triage

## 2018-09-21 ENCOUNTER — Ambulatory Visit: Payer: Medicare HMO | Admitting: Adult Health

## 2018-09-21 ENCOUNTER — Ambulatory Visit
Admission: RE | Admit: 2018-09-21 | Discharge: 2018-09-21 | Disposition: A | Discharge status: Home / Self Care | Payer: Medicare HMO | Source: Ambulatory Visit | Attending: Adult Health | Admitting: Adult Health

## 2018-09-21 ENCOUNTER — Encounter: Payer: Self-pay | Admitting: Adult Health

## 2018-09-21 VITALS — BP 122/83 | HR 96 | Ht 73.0 in | Wt 204.6 lb

## 2018-09-21 DIAGNOSIS — I5022 Chronic systolic (congestive) heart failure: Secondary | ICD-10-CM

## 2018-09-21 DIAGNOSIS — R059 Cough, unspecified: Secondary | ICD-10-CM

## 2018-09-21 DIAGNOSIS — R05 Cough: Secondary | ICD-10-CM

## 2018-09-21 DIAGNOSIS — R0989 Other specified symptoms and signs involving the circulatory and respiratory systems: Secondary | ICD-10-CM

## 2018-09-21 DIAGNOSIS — Z79899 Other long term (current) drug therapy: Secondary | ICD-10-CM | POA: Diagnosis not present

## 2018-09-21 DIAGNOSIS — J40 Bronchitis, not specified as acute or chronic: Secondary | ICD-10-CM

## 2018-09-21 MED ORDER — HYDROCOD POLST-CPM POLST ER 10-8 MG/5ML PO SUER: 5.0000 mL | Freq: Two times a day (BID) | ORAL | 0 refills | Status: DC | PRN

## 2018-09-21 MED ORDER — AMOXICILLIN-POT CLAVULANATE ER 1000-62.5 MG PO TB12: 2.0000 | ORAL_TABLET | Freq: Two times a day (BID) | ORAL | 0 refills | Status: DC

## 2018-09-21 NOTE — Patient Instructions (Addendum)
Medication Instructions:  START AMOXACILLIN 1000-6.25MG  TWICE DAILY  START TUSSIONEX TWICE DAILY AS NEEDED   HOLD LASIX UNTIL THE END OF THE WEEK  HOLD CHLORTHALIDONE FOR 2 DAYS  If you need a refill on your cardiac medications before your next appointment, please call your pharmacy.  Labwork: SPUTUM CBC AND BMET TODAY HERE IN OUR OFFICE AT [facility]  Take the provided lab slips with you to the lab for your blood draw.   If you have labs (blood work) drawn today and your tests are completely normal, you will receive your results only by: Marland Kitchen MyChart Message (if you have MyChart) OR . A paper copy in the mail If you have any lab test that is abnormal or we need to change your treatment, we will call you to review the results.  Special Instructions: GO TO [facility] FOR YOU CHEST XRAY [place]: You will need a follow up appointment in 1 WEEK.   You may see  DR Hans Eden, DNP, AACC or one of the following Advanced Practice Providers on your designated Care Team:    . Jory Sims, DNP, ANP   Rosaria Ferries, PA-C  At [facility], you and your health needs are our priority.  As part of our continuing mission to provide you with exceptional heart care, we have created designated Provider Care Teams.  These Care Teams include your primary Cardiologist (physician) and Advanced Practice Providers (APPs -  Physician Assistants and Nurse Practitioners) who all work together to provide you with the care you need, when you need it.  Thank you for choosing [facility] at [facility]!!

## 2018-09-21 NOTE — Progress Notes (Signed)
Cardiology Office Note   Date:  09/21/2018   ID:  Bruce Robertson, DOB 06-17-1961, MRN [id number]  PCP:  No primary care provider on file.  Cardiologist:  Dr. [name]  Chief Complaint  Patient presents with  . Hemoptysis    Coughing up brown sputum x 2 weeks  . Weight Loss     History of Present Illness: Bruce Robertson is a 57 y.o. male who presents for ongoing assessment and management of NICM, OSA on CPAP followed by [facility]. Hospitalization for acute on chronic CHF, EF of 20-25%, dry weight of 219 lbs. ICD in situ followed by EP.    He comes today with 2 weeks of worsening cough and congestion. He has had night sweats and has been unable to sleep due to frequent coughing. He is coughing up brownish/ yellow sputum, in globs He has stopped his lasix today, but has been doubling up on is for a few days thinking his heart failure was coming back. He has not eaten regularly or has been drinking very much fluid because he has been feeling badly. He has lost 14 lbs in 2 weeks. Due to worsening symptoms, without a PCP he presents today for appointment.   Past Medical History:  Diagnosis Date  . AICD (automatic cardioverter/defibrillator) present   . Anxiety   . Cardiomyopathy    Nonischemic. EF has been about 45%. S/P CABG.;  b.  Echo 4/14: EF 25%, global HK with inf and mid apical AK, restrictive physiology with E/e' > 15 (elevated LV filling pressure), trivial AI/MR, mild to mod LAE, mild RVE, mild reduced RVSF, mild RAE, mod TR, PASP 74 (severe pulmonary HTN)   . CHF (congestive heart failure) ([place])   . CHF exacerbation ([place]) 07/31/2016  . Colon polyps 2002  . Coronary artery disease   . Depression   . Dyslipidemia    hx (03/20/2017)  . GERD (gastroesophageal reflux disease)   . History of bleeding peptic ulcer   . History of echocardiogram    Echo 10/16:  EF 25-30%, poss non-compaction, diff HK with inf-lat and apical HK, restrictive physio, mild AI, severe LAE, mild RVE with mild  reduced RVSF, PASP 42 mmHg  . Hyperplastic rectal polyp   . OSA on CPAP    Mild  . Tobacco abuse    Remote    Past Surgical History:  Procedure Laterality Date  . CARDIAC CATHETERIZATION  2007;  2009  . CARDIAC DEFIBRILLATOR PLACEMENT  03/20/2017  . CORONARY ARTERY BYPASS GRAFT  2007   Had a left main dissection after catheterization. Underwent a saphenous vein graft to the LAD and a saphenous vein graft to obtuse marginal.  . ICD IMPLANT N/A 03/20/2017   Procedure: ICD Implant;  Surgeon: Constance Haw, MD;  Location: [place] [facility];  Service: Cardiovascular;  Laterality: N/A;  . PATELLAR TENDON REPAIR Right 2005     Current Outpatient Medications  Medication Sig Dispense Refill  . carvedilol (COREG) 6.25 MG tablet Take 1 tablet (6.25 mg total) by mouth 2 (two) times daily with a meal. 30 tablet 0  . furosemide (LASIX) 40 MG tablet TAKE 1/2 (ONE-HALF) TABLET BY MOUTH ONCE DAILY 30 tablet 0  . pantoprazole (PROTONIX) 40 MG tablet Take 1 tablet (40 mg total) by mouth daily. 30 tablet 0  . simvastatin (ZOCOR) 20 MG tablet TAKE 1 TABLET BY MOUTH AT BEDTIME 30 tablet 10  . spironolactone (ALDACTONE) 25 MG tablet TAKE 1/2 (ONE-HALF) TABLET BY MOUTH ONCE DAILY 30 tablet  0  . vitamin B-12 (CYANOCOBALAMIN) 1000 MCG tablet Take 1,000 mcg by mouth daily.     No current facility-administered medications for this visit.     Allergies:   Patient has no known allergies.    Social History:  The patient  reports that he has quit smoking. His smoking use included cigarettes. He has a 0.70 pack-year smoking history. He has never used smokeless tobacco. He reports that he drinks alcohol. He reports that he has current or past drug history. Drug: Marijuana.   Family History:  The patient's family history includes Cancer in his other; Heart failure in his father; Hypertension in his mother; Lung cancer in his other; Osteoarthritis in his mother.    ROS: All other systems are reviewed and  negative. Unless otherwise mentioned in H&P    PHYSICAL EXAM: VS:  BP 122/83   Pulse 96   Ht 6\' 1"  (1.854 m)   Wt 204 lb 9.6 oz (92.8 kg)   BMI 26.99 kg/m  , BMI Body mass index is 26.99 kg/m. GEN: Well nourished, well developed, in no acute distress HEENT: normal Neck: no JVD, carotid bruits, or masses Cardiac: RRR; no murmurs, rubs, or gallops,no edema  Respiratory:  Bilateral rales and some crackles in the upper airway. Frequent productive coughing.  GI: soft, nontender, nondistended, + BS MS: no deformity or atrophy Skin: warm and dry, no rash Neuro:  Strength and sensation are intact Psych: euthymic mood, full affect   EKG:  Not completed this office visit.   Recent Labs: 06/09/2018: B Natriuretic Peptide 764.0 06/10/2018: BUN 16; Creatinine, Ser 1.47; Hemoglobin 15.4; Platelets 148; Potassium 3.9; Sodium 138    Lipid Panel    Component Value Date/Time   CHOL 152 06/10/2018 0420   TRIG 71 06/10/2018 0420   HDL 41 06/10/2018 0420   CHOLHDL 3.7 06/10/2018 0420   VLDL 14 06/10/2018 0420   LDLCALC 97 06/10/2018 0420   LDLDIRECT 166.4 05/17/2010 0905      Wt Readings from Last 3 Encounters:  09/21/18 204 lb 9.6 oz (92.8 kg)  07/02/18 224 lb 9.6 oz (101.9 kg)  06/10/18 219 lb 6.4 oz (99.5 kg)      Other studies Reviewed: Echo 04/01/18:Study Conclusions  - Left ventricle: The cavity size was severely dilated. Systolic function was severely reduced. The estimated ejection fraction was in the range of 20% to 25%. Diffuse hypokinesis. Features are consistent with a pseudonormal left ventricular filling pattern, with concomitant abnormal relaxation and increased filling pressure (grade 2 diastolic dysfunction). Doppler parameters are consistent with high ventricular filling pressure. - Aortic valve: There was mild regurgitation. - Mitral valve: Transvalvular velocity was within the normal range. There was no evidence for stenosis. There was  trivial regurgitation. - Left atrium: The atrium was mildly dilated. - Right ventricle: The cavity size was normal. Wall thickness was normal. Systolic function was mildly reduced. - Atrial septum: No defect or patent foramen ovale was identified by color flow Doppler. - Tricuspid valve: There was moderate regurgitation. - Pulmonary arteries: Systolic pressure was mildly increased. PA peak pressure: 44 mm Hg (S).   ASSESSMENT AND PLAN:  1.  Acute bacterial bronchitis vs pneumonia: I will get a CXR PA/Lateral, begin Augmentin XR 1000 mg BID, he will give [place] a sputum sample for identification of specific bacteria. He will be given Tussinex for cough and congestion. He is not to return to work for one week. A letter is provided to him.  BMET, CBC are also  ordered.   2. Chronic Systolic CHF: He is to hold lasix for 3 days and spironolactone for two days until he is eating and drinking regularly. I doubt CHF at this time due to his exam. To avoid dehydration, I have advised temporary increase in fluids until he is back to normal eating and drinking. I will see him back in a week for follow up.  He needs a PCP.    Current medicines are reviewed at length with the patient today.    Labs/ tests ordered today include: Sputum culture, BMET and CBC.   Phill Myron. West Pugh, ANP, AACC   09/21/2018 2:28 PM    [address] Office [phone] Fax [phone]

## 2018-09-22 ENCOUNTER — Ambulatory Visit (INDEPENDENT_AMBULATORY_CARE_PROVIDER_SITE_OTHER): Payer: Medicare HMO

## 2018-09-22 DIAGNOSIS — I428 Other cardiomyopathies: Secondary | ICD-10-CM | POA: Diagnosis not present

## 2018-09-22 LAB — CBC
Hematocrit: 48 % (ref 37.5–51.0)
Hemoglobin: 16 g/dL (ref 13.0–17.7)
MCH: 27.2 pg (ref 26.6–33.0)
MCHC: 33.3 g/dL (ref 31.5–35.7)
MCV: 82 fL (ref 79–97)
Platelets: 249 10*3/uL (ref 150–450)
RBC: 5.89 x10E6/uL — ABNORMAL HIGH (ref 4.14–5.80)
RDW: 14.1 % (ref 12.3–15.4)
WBC: 8.5 10*3/uL (ref 3.4–10.8)

## 2018-09-22 LAB — BASIC METABOLIC PANEL
BUN/Creatinine Ratio: 13 (ref 9–20)
BUN: 20 mg/dL (ref 6–24)
CO2: 26 mmol/L (ref 20–29)
Calcium: 10.4 mg/dL — ABNORMAL HIGH (ref 8.7–10.2)
Chloride: 95 mmol/L — ABNORMAL LOW (ref 96–106)
Creatinine, Ser: 1.5 mg/dL — ABNORMAL HIGH (ref 0.76–1.27)
GFR calc Af Amer: 60 mL/min/{1.73_m2} (ref >59)
GFR calc non Af Amer: 52 mL/min/{1.73_m2} — ABNORMAL LOW (ref >59)
Glucose: 119 mg/dL — ABNORMAL HIGH (ref 65–99)
Potassium: 4.9 mmol/L (ref 3.5–5.2)
Sodium: 138 mmol/L (ref 134–144)

## 2018-09-24 NOTE — Progress Notes (Signed)
Remote ICD transmission.   

## 2018-09-26 NOTE — Progress Notes (Signed)
Cardiology Office Note   Date:  09/28/2018   ID:  Bruce Robertson, DOB 02/10/1961, MRN [id number]  PCP:  Patient, No Pcp Per  Cardiologist: Dr. [name] Chief Complaint  Patient presents with  . Follow-up     History of Present Illness: Bruce Robertson is a 57 y.o. male who presents for follow-up after being seen in the office 1 week ago for cough and chest congestion.  He was unable to make an appointment with his primary care physician and therefore was seen in our office for his symptoms.  He has a past medical history of nonischemic cardiomyopathy with an EF of 20% to 25%, OSA on CPAP followed by Dr. Claiborne Billings, ICD in situ followed by EP.  The patient was placed on Augmentin, Tussionex, chest x-ray was completed to evaluate further for pneumonia versus bronchitis.  Sputum culture was also ordered.  He has stopped taking his Lasix because he was not eating and drinking.  He was temporarily asked to stop Spironolactone and Lasix for 2 days and then resume normal regimen as he began to feel better and eat and drink regularly.  He is feeling remarkably better. He has finished his course of antibiotics. He has started back on lasix and spironolactone. Sputum culture was positive for Haemophilus influenza and streptococcus pneumoniae. He continues to have some coughing which is productive and but less dense and more clear in color throughout the day.   Past Medical History:  Diagnosis Date  . AICD (automatic cardioverter/defibrillator) present   . Anxiety   . Cardiomyopathy    Nonischemic. EF has been about 45%. S/P CABG.;  b.  Echo 4/14: EF 25%, global HK with inf and mid apical AK, restrictive physiology with E/e' > 15 (elevated LV filling pressure), trivial AI/MR, mild to mod LAE, mild RVE, mild reduced RVSF, mild RAE, mod TR, PASP 74 (severe pulmonary HTN)   . CHF (congestive heart failure) ([place])   . CHF exacerbation ([place]) 07/31/2016  . Colon polyps 2002  . Coronary artery disease   .  Depression   . Dyslipidemia    hx (03/20/2017)  . GERD (gastroesophageal reflux disease)   . History of bleeding peptic ulcer   . History of echocardiogram    Echo 10/16:  EF 25-30%, poss non-compaction, diff HK with inf-lat and apical HK, restrictive physio, mild AI, severe LAE, mild RVE with mild reduced RVSF, PASP 42 mmHg  . Hyperplastic rectal polyp   . OSA on CPAP    Mild  . Tobacco abuse    Remote    Past Surgical History:  Procedure Laterality Date  . CARDIAC CATHETERIZATION  2007;  2009  . CARDIAC DEFIBRILLATOR PLACEMENT  03/20/2017  . CORONARY ARTERY BYPASS GRAFT  2007   Had a left main dissection after catheterization. Underwent a saphenous vein graft to the LAD and a saphenous vein graft to obtuse marginal.  . ICD IMPLANT N/A 03/20/2017   Procedure: ICD Implant;  Surgeon: Constance Haw, MD;  Location: [place] [facility];  Service: Cardiovascular;  Laterality: N/A;  . PATELLAR TENDON REPAIR Right 2005     Current Outpatient Medications  Medication Sig Dispense Refill  . carvedilol (COREG) 6.25 MG tablet Take 1 tablet (6.25 mg total) by mouth 2 (two) times daily with a meal. 30 tablet 0  . furosemide (LASIX) 40 MG tablet Take 40 mg by mouth daily.    . simvastatin (ZOCOR) 20 MG tablet TAKE 1 TABLET BY MOUTH AT BEDTIME 30 tablet 10  .  spironolactone (ALDACTONE) 25 MG tablet TAKE 1/2 (ONE-HALF) TABLET BY MOUTH ONCE DAILY 30 tablet 0  . vitamin B-12 (CYANOCOBALAMIN) 1000 MCG tablet Take 1,000 mcg by mouth daily.     No current facility-administered medications for this visit.     Allergies:   Patient has no known allergies.    Social History:  The patient  reports that he has quit smoking. His smoking use included cigarettes. He has a 0.70 pack-year smoking history. He has never used smokeless tobacco. He reports that he drinks alcohol. He reports that he has current or past drug history. Drug: Marijuana.   Family History:  The patient's family history includes  Cancer in his other; Heart failure in his father; Hypertension in his mother; Lung cancer in his other; Osteoarthritis in his mother.    ROS: All other systems are reviewed and negative. Unless otherwise mentioned in H&P    PHYSICAL EXAM: VS:  BP 122/60   Pulse 79   Ht 6\' 1"  (1.854 m)   Wt 217 lb 9.6 oz (98.7 kg)   SpO2 99%   BMI 28.71 kg/m  , BMI Body mass index is 28.71 kg/m. GEN: Well nourished, well developed, in no acute distress HEENT: normal Neck: no JVD, carotid bruits, or masses Cardiac: RRR; no murmurs, rubs, or gallops,no edema  Respiratory:  Clear to auscultation bilaterally, normal work of breathing, mild crackles in the right base.  GI: soft, nontender, nondistended, + BS MS: no deformity or atrophy Skin: warm and dry, no rash Neuro:  Strength and sensation are intact Psych: euthymic mood, full affect   EKG:  Not completed this office visit.   Recent Labs: 06/09/2018: B Natriuretic Peptide 764.0 09/21/2018: BUN 20; Creatinine, Ser 1.50; Hemoglobin 16.0; Platelets 249; Potassium 4.9; Sodium 138    Lipid Panel    Component Value Date/Time   CHOL 152 06/10/2018 0420   TRIG 71 06/10/2018 0420   HDL 41 06/10/2018 0420   CHOLHDL 3.7 06/10/2018 0420   VLDL 14 06/10/2018 0420   LDLCALC 97 06/10/2018 0420   LDLDIRECT 166.4 05/17/2010 0905      Wt Readings from Last 3 Encounters:  09/28/18 217 lb 9.6 oz (98.7 kg)  09/21/18 204 lb 9.6 oz (92.8 kg)  07/02/18 224 lb 9.6 oz (101.9 kg)      Other studies Reviewed: Echocardiogram 2018-04-03  - Left ventricle: The cavity size was severely dilated. Systolic   function was severely reduced. The estimated ejection fraction   was in the range of 20% to 25%. Diffuse hypokinesis. Features are   consistent with a pseudonormal left ventricular filling pattern,   with concomitant abnormal relaxation and increased filling   pressure (grade 2 diastolic dysfunction). Doppler parameters are   consistent with high  ventricular filling pressure. - Aortic valve: There was mild regurgitation. - Mitral valve: Transvalvular velocity was within the normal range.   There was no evidence for stenosis. There was trivial   regurgitation. - Left atrium: The atrium was mildly dilated. - Right ventricle: The cavity size was normal. Wall thickness was   normal. Systolic function was mildly reduced. - Atrial septum: No defect or patent foramen ovale was identified   by color flow Doppler. - Tricuspid valve: There was moderate regurgitation. - Pulmonary arteries: Systolic pressure was mildly increased. PA   peak pressure: 44 mm Hg (S).  ASSESSMENT AND PLAN:  1. Bacterial pneumonia: He was treated with Augmentin 1000 mg BID for 7 days and is feeling much better. Still  has a cough but much less production of sputum. He will follow up with PCP for ongoing primary care management He can go back to work.He is advised to clean and sterilize his CPAP machine before returning to using it after having this infection, to prevent further contamination.   2. Chronic Mixed CHF: He has started back on lasix and spironolactone for volume control and BP control. BP is slightly elevated today, he has been increasing his fluids due to anorexia associated with pneumonia. He is to go back to normal fluid restriction and salt restrictions. He has gained about   8 lbs. He will continue to monitor weight.   3. Hypertension BP is slightly elevated per reduced EF. Back on diuretics and antihypertensive medications. Follow up in 6 months unless symptomatic.     Current medicines are reviewed at length with the patient today.    Labs/ tests ordered today include: None  Phill Myron. West Pugh, ANP, AACC   09/28/2018 12:29 PM    [address] Office [phone] Fax [phone]

## 2018-09-28 ENCOUNTER — Ambulatory Visit (INDEPENDENT_AMBULATORY_CARE_PROVIDER_SITE_OTHER): Payer: Medicare HMO | Admitting: Adult Health

## 2018-09-28 ENCOUNTER — Encounter: Payer: Self-pay | Admitting: Adult Health

## 2018-09-28 VITALS — BP 122/60 | HR 79 | Ht 73.0 in | Wt 217.6 lb

## 2018-09-28 DIAGNOSIS — J159 Unspecified bacterial pneumonia: Secondary | ICD-10-CM | POA: Diagnosis not present

## 2018-09-28 DIAGNOSIS — I10 Essential (primary) hypertension: Secondary | ICD-10-CM

## 2018-09-28 DIAGNOSIS — I5022 Chronic systolic (congestive) heart failure: Secondary | ICD-10-CM

## 2018-09-28 NOTE — Patient Instructions (Signed)
Follow-Up: You will need a follow up appointment in 6 MONTHS.  Please call our office 2 months in advance(MAR 2020) to schedule the (MAY 2020) appointment.  You may see  DR Hans Eden, DNP, [place] -or- one of the following Advanced Practice Providers on your designated Care Team:    . Rosaria Ferries, PA-C . Jory Sims, DNP, ANP  Medication Instructions:  PLEASE RESTART SPIRONOLACTONE AND LASIX   If you need a refill on your cardiac medications before your next appointment, please call your pharmacy.  Labwork: If you have labs (blood work) drawn today and your tests are completely normal, you will receive your results ONLY by: . MyChart Message (if you have MyChart) -OR- . A paper copy in the mail  At [facility], you and your health needs are our priority.  As part of our continuing mission to provide you with exceptional heart care, we have created designated Provider Care Teams.  These Care Teams include your primary Cardiologist (physician) and Advanced Practice Providers (APPs -  Physician Assistants and Nurse Practitioners) who all work together to provide you with the care you need, when you need it.  Thank you for choosing [facility] at [facility]!!

## 2018-09-30 DIAGNOSIS — G4733 Obstructive sleep apnea (adult) (pediatric): Secondary | ICD-10-CM | POA: Diagnosis not present

## 2018-09-30 LAB — SPUTUM CULTURE

## 2018-09-30 LAB — GRAM STAIN W/SPUTUM CULT RFLX: White Blood Cells: NONE SEEN

## 2018-10-05 ENCOUNTER — Telehealth: Payer: Self-pay | Admitting: Cardiology

## 2018-10-05 NOTE — Telephone Encounter (Signed)
   Patient saw Bruce Robertson on 09/21/18 and 09/28/18. He is calling today because what he had is back and he is having a lot of chest congestion. Patient states that he feels worse than before and the stuff he is coughing up is thicker. Would like to know if he can have more medicine or talk to the doctor.

## 2018-10-05 NOTE — Telephone Encounter (Signed)
He should be seen by Urgent Care. He was supposed to find a PCP. This was advised on last visit. He may need another course of antibiotics but would prefer that he sees PCP or Urgent Care for this.

## 2018-10-05 NOTE — Telephone Encounter (Signed)
Called patient, he states that he is continuing to have coughing and a lot of chest congestion, worse from his previous visits with NP. Patient would like to know if any other medication is needed. I did advise patient that if he feels that terrible he should be seen by urgent care, or even the ER if he feels that he can not breath.  Patient verbalized understanding.

## 2018-10-05 NOTE — Telephone Encounter (Signed)
Contacted patient and advised to go to urgent care, also gave number to contact [facility] to get a list of providers accepting new patients for PCP. Patient was thankful for the call.

## 2018-10-06 ENCOUNTER — Telehealth: Payer: Self-pay | Admitting: Adult Health

## 2018-10-06 NOTE — Telephone Encounter (Signed)
New Message:   Patient calling, because the patient states that the was going to give him a number to a primary care Dr. Please call patient back.

## 2018-10-06 NOTE — Telephone Encounter (Signed)
Any of the physicians of [place].

## 2018-10-06 NOTE — Telephone Encounter (Signed)
Returned call to patient. He already has made an appointment with a new PCP. No further assistance needed

## 2018-10-06 NOTE — Telephone Encounter (Signed)
Any recommendations for a PCP? Per phone message yesterday, Almyra Free LPN provided him with [facility] phone # for list of PCPs

## 2018-10-21 ENCOUNTER — Other Ambulatory Visit: Payer: Self-pay | Admitting: Cardiology

## 2018-10-30 ENCOUNTER — Ambulatory Visit: Payer: Medicare HMO | Admitting: Family Medicine

## 2018-10-30 DIAGNOSIS — G4733 Obstructive sleep apnea (adult) (pediatric): Secondary | ICD-10-CM | POA: Diagnosis not present

## 2018-10-30 NOTE — Progress Notes (Deleted)
Bruce Robertson DOB: 06/14/1961 Encounter date: 10/30/2018  This isa 57 y.o. male who presents to establish care. No chief complaint on file.   History of present illness: Follows with cardiology - Jory Sims NP last visit 09/28/18: nonischemic cardiomyopathy w EF 20-25%; has AICD. ON coreg, lasix, aldactone. OSA on CPAP GERD: Anxiety: Depression: HL: on simvastatin CKD: Impaired fasting glucose (last A1C 06/2018 was 6/3):  (due for repeat bloodwork Feb)  ***   Past Medical History:  Diagnosis Date  . AICD (automatic cardioverter/defibrillator) present   . Anxiety   . Cardiomyopathy    Nonischemic. EF has been about 45%. S/P CABG.;  b.  Echo 4/14: EF 25%, global HK with inf and mid apical AK, restrictive physiology with E/e' > 15 (elevated LV filling pressure), trivial AI/MR, mild to mod LAE, mild RVE, mild reduced RVSF, mild RAE, mod TR, PASP 74 (severe pulmonary HTN)   . CHF (congestive heart failure) ([place])   . CHF exacerbation ([place]) 07/31/2016  . Colon polyps 2002  . Coronary artery disease   . Depression   . Dyslipidemia    hx (03/20/2017)  . GERD (gastroesophageal reflux disease)   . History of bleeding peptic ulcer   . History of echocardiogram    Echo 10/16:  EF 25-30%, poss non-compaction, diff HK with inf-lat and apical HK, restrictive physio, mild AI, severe LAE, mild RVE with mild reduced RVSF, PASP 42 mmHg  . Hyperplastic rectal polyp   . OSA on CPAP    Mild  . Tobacco abuse    Remote   Past Surgical History:  Procedure Laterality Date  . CARDIAC CATHETERIZATION  2007;  2009  . CARDIAC DEFIBRILLATOR PLACEMENT  03/20/2017  . CORONARY ARTERY BYPASS GRAFT  2007   Had a left main dissection after catheterization. Underwent a saphenous vein graft to the LAD and a saphenous vein graft to obtuse marginal.  . ICD IMPLANT N/A 03/20/2017   Procedure: ICD Implant;  Surgeon: Constance Haw, MD;  Location: [place] [facility];  Service: Cardiovascular;   Laterality: N/A;  . PATELLAR TENDON REPAIR Right 2005   No Known Allergies No outpatient medications have been marked as taking for the 10/30/18 encounter (Appointment) with Caren Macadam, MD.   Social History   Tobacco Use  . Smoking status: Former Smoker    Packs/day: 0.10    Years: 7.00    Pack years: 0.70    Types: Cigarettes  . Smokeless tobacco: Never Used  . Tobacco comment: 03/20/2017 "quit before 2000"  Substance Use Topics  . Alcohol use: Yes    Alcohol/week: 0.0 standard drinks    Comment: 03/20/2017 "might have 6 drinks/year @ the most"   Family History  Problem Relation Age of Onset  . Hypertension Mother   . Osteoarthritis Mother   . Heart failure Father   . Lung cancer Other   . Cancer Other      Review of Systems  Objective:  There were no vitals taken for this visit.      BP Readings from Last 3 Encounters:  09/28/18 122/60  09/21/18 122/83  07/02/18 112/64   Wt Readings from Last 3 Encounters:  09/28/18 217 lb 9.6 oz (98.7 kg)  09/21/18 204 lb 9.6 oz (92.8 kg)  07/02/18 224 lb 9.6 oz (101.9 kg)    Physical Exam  Assessment/Plan:  There are no diagnoses linked to this encounter.  No follow-ups on file.  Micheline Rough, MD

## 2018-11-18 LAB — CUP PACEART REMOTE DEVICE CHECK
Battery Remaining Longevity: 127 mo
Battery Voltage: 3.01 V
Brady Statistic RV Percent Paced: 0.01 %
Date Time Interrogation Session: 20191119094223
HighPow Impedance: 73 Ohm
Implantable Lead Implant Date: 20180517
Implantable Lead Location: 753860
Implantable Pulse Generator Implant Date: 20180517
Lead Channel Impedance Value: 361 Ohm
Lead Channel Impedance Value: 456 Ohm
Lead Channel Pacing Threshold Amplitude: 0.625 V
Lead Channel Pacing Threshold Pulse Width: 0.4 ms
Lead Channel Sensing Intrinsic Amplitude: 17.875 mV
Lead Channel Sensing Intrinsic Amplitude: 17.875 mV
Lead Channel Setting Pacing Amplitude: 2.5 V
Lead Channel Setting Pacing Pulse Width: 0.4 ms
Lead Channel Setting Sensing Sensitivity: 0.3 mV

## 2018-11-25 ENCOUNTER — Encounter: Payer: Self-pay | Admitting: Family Medicine

## 2018-11-25 ENCOUNTER — Ambulatory Visit (INDEPENDENT_AMBULATORY_CARE_PROVIDER_SITE_OTHER): Payer: Medicare HMO | Admitting: Family Medicine

## 2018-11-25 VITALS — BP 110/62 | HR 64 | Temp 97.9°F | Ht 73.0 in | Wt 236.8 lb

## 2018-11-25 DIAGNOSIS — R7303 Prediabetes: Secondary | ICD-10-CM | POA: Diagnosis not present

## 2018-11-25 DIAGNOSIS — I10 Essential (primary) hypertension: Secondary | ICD-10-CM

## 2018-11-25 DIAGNOSIS — R05 Cough: Secondary | ICD-10-CM

## 2018-11-25 DIAGNOSIS — I420 Dilated cardiomyopathy: Secondary | ICD-10-CM

## 2018-11-25 DIAGNOSIS — G4733 Obstructive sleep apnea (adult) (pediatric): Secondary | ICD-10-CM | POA: Diagnosis not present

## 2018-11-25 DIAGNOSIS — E538 Deficiency of other specified B group vitamins: Secondary | ICD-10-CM | POA: Diagnosis not present

## 2018-11-25 DIAGNOSIS — Z23 Encounter for immunization: Secondary | ICD-10-CM

## 2018-11-25 DIAGNOSIS — R059 Cough, unspecified: Secondary | ICD-10-CM

## 2018-11-25 DIAGNOSIS — K635 Polyp of colon: Secondary | ICD-10-CM

## 2018-11-25 DIAGNOSIS — E785 Hyperlipidemia, unspecified: Secondary | ICD-10-CM | POA: Diagnosis not present

## 2018-11-25 NOTE — Progress Notes (Signed)
Bruce Robertson DOB: 13-Dec-1960 Encounter date: 11/25/2018  This isa 58 y.o. male who presents to establish care. Chief Complaint  Patient presents with  . New Patient (Initial Visit)    History of present illness: Has not been following with regular doc.   OSA on CPAP: uses this most nights. Feels he needs to get this adjusted. One he has now is newer, but has been a couple of years since getting first one and feels there might be need for change.  ICD followed by EP: Nonischemic cardiomyopathy followed by cardiology on lasix and spironolactone  About a month ago went to cardiology and was told that he had bacterial pneumonia. Tx with antibiotic and cough syrup. Went away, but not gone. Notes that in the morning coughing up dark, thick mucous states that sometimes it is black. More colored in morning. No blood that he notes. Wheezing every once in awhile. No fevers, chills. He was extremely sick with that resp illness; lost over 30lbs.   He is on salt free diet now. When fluid builds up on inside he can tell; feels himself get "heavy". Feels like fluid is just building up and makes it harder to breathe. Not consistent when he has symptoms - sometimes goes to gym and works out and is fine but then gets SOB going to bathroom. Legs not swelling; hands (mostly right) swell every now and then.   Still has acid reflux, no stomach pain. Hx of ulcer, but that was years ago. Used to take OTC medications, but stopped because didn't seem like it was helping.   Depression: notices when those days are coming. Gets busy, prayers, keeps moving and has been able to manage. Was on medication for awhile (doesn't remember name) but used to let it consume him more - motivation issue. Didn't do well with medications in past. Anxiety not issue for him.   On disability for heart issues - has been on disability for 3 years. Was truck driver prior to that.   Past Medical History:  Diagnosis Date  . AICD  (automatic cardioverter/defibrillator) present   . Anxiety   . Cardiomyopathy    Nonischemic. EF has been about 45%. S/P CABG.;  b.  Echo 4/14: EF 25%, global HK with inf and mid apical AK, restrictive physiology with E/e' > 15 (elevated LV filling pressure), trivial AI/MR, mild to mod LAE, mild RVE, mild reduced RVSF, mild RAE, mod TR, PASP 74 (severe pulmonary HTN)   . CHF (congestive heart failure) ([place])   . CHF exacerbation ([place]) 07/31/2016  . Colon polyps 2002  . Coronary artery disease   . Depression   . Dyslipidemia    hx (03/20/2017)  . GERD (gastroesophageal reflux disease)   . History of bleeding peptic ulcer   . History of echocardiogram    Echo 10/16:  EF 25-30%, poss non-compaction, diff HK with inf-lat and apical HK, restrictive physio, mild AI, severe LAE, mild RVE with mild reduced RVSF, PASP 42 mmHg  . Hyperplastic rectal polyp   . OSA on CPAP    Mild  . Tobacco abuse    Remote   Past Surgical History:  Procedure Laterality Date  . CARDIAC CATHETERIZATION  2007;  2009  . CARDIAC DEFIBRILLATOR PLACEMENT  03/20/2017  . CORONARY ARTERY BYPASS GRAFT  2007   Had a left main dissection after catheterization. Underwent a saphenous vein graft to the LAD and a saphenous vein graft to obtuse marginal.  . ICD IMPLANT N/A 03/20/2017  Procedure: ICD Implant;  Surgeon: Constance Haw, MD;  Location: [facility];  Service: Cardiovascular;  Laterality: N/A;  . PATELLAR TENDON REPAIR Right 2005   No Known Allergies Current Meds  Medication Sig  . carvedilol (COREG) 6.25 MG tablet Take 1 tablet (6.25 mg total) by mouth 2 (two) times daily with a meal.  . furosemide (LASIX) 40 MG tablet Take 40 mg by mouth daily.  Marland Kitchen GLUCOSAMINE CHONDROITIN COMPLX PO Take 1,000 mg by mouth.  . simvastatin (ZOCOR) 20 MG tablet TAKE 1 TABLET BY MOUTH AT BEDTIME  . spironolactone (ALDACTONE) 25 MG tablet TAKE 1/2 (ONE-HALF) TABLET BY MOUTH ONCE DAILY  . vitamin B-12 (CYANOCOBALAMIN) 1000  MCG tablet Take 1,000 mcg by mouth daily.  Marland Kitchen zinc gluconate 50 MG tablet Take 50 mg by mouth daily.   Social History   Tobacco Use  . Smoking status: Former Smoker    Packs/day: 0.10    Years: 7.00    Pack years: 0.70    Types: Cigarettes  . Smokeless tobacco: Never Used  . Tobacco comment: 03/20/2017 "quit before 2000"  Substance Use Topics  . Alcohol use: Yes    Alcohol/week: 0.0 standard drinks    Comment: 03/20/2017 "might have 6 drinks/year @ the most"   Family History  Problem Relation Age of Onset  . Hypertension Mother   . Osteoarthritis Mother   . Heart failure Father 81  . High blood pressure Father   . Lung cancer Other   . Cancer Maternal Grandmother        uncertain type  . Bipolar disorder Daughter   . Cancer Maternal Uncle        uncertain type     Review of Systems  Constitutional: Negative for chills, fatigue and fever.  HENT: Negative for congestion.   Respiratory: Positive for shortness of breath (intermittent, see hpi). Negative for cough, chest tightness and wheezing.   Cardiovascular: Negative for chest pain, palpitations and leg swelling.  Neurological: Negative for dizziness, light-headedness and headaches.    Objective:  BP 110/62 (BP Location: Left Arm, Patient Position: Sitting, Cuff Size: Large)   Pulse 64   Temp 97.9 F (36.6 C) (Oral)   Ht 6\' 1"  (1.854 m)   Wt 236 lb 12.8 oz (107.4 kg)   SpO2 96%   BMI 31.24 kg/m   Weight: 236 lb 12.8 oz (107.4 kg)   BP Readings from Last 3 Encounters:  11/25/18 110/62  09/28/18 122/60  09/21/18 122/83   Wt Readings from Last 3 Encounters:  11/25/18 236 lb 12.8 oz (107.4 kg)  09/28/18 217 lb 9.6 oz (98.7 kg)  09/21/18 204 lb 9.6 oz (92.8 kg)    Physical Exam Constitutional:      General: He is not in acute distress.    Appearance: He is well-developed.  Cardiovascular:     Rate and Rhythm: Normal rate and regular rhythm.     Heart sounds: Murmur present. Systolic murmur present with a  grade of 2/6. No friction rub.  Pulmonary:     Effort: Pulmonary effort is normal. No respiratory distress.     Breath sounds: Normal breath sounds. No decreased breath sounds, wheezing, rhonchi or rales.  Musculoskeletal:     Right lower leg: No edema.     Left lower leg: No edema.  Neurological:     Mental Status: He is alert and oriented to person, place, and time.  Psychiatric:        Attention and Perception:  Attention normal.        Speech: Speech normal.        Behavior: Behavior normal. Behavior is cooperative.        Cognition and Memory: Cognition normal.     Assessment/Plan: 1. Cardiomyopathy, dilated, nonischemic ([place]) Follows with cardiology. - Brain natriuretic peptide; Future - Brain natriuretic peptide  2. Essential hypertension Stable. Continue current medication. - CBC with Differential/Platelet; Future - Comprehensive metabolic panel; Future - Comprehensive metabolic panel - CBC with Differential/Platelet  3. OSA (obstructive sleep apnea) Referral to sleep specialist for re-eval of settings/response. - Ambulatory referral to Sleep Studies  4. Hyperlipidemia, unspecified hyperlipidemia type - Lipid panel; Future - TSH; Future - TSH - Lipid panel  5. Prediabetes - Hemoglobin A1c; Future - Hemoglobin A1c  6. B12 deficiency History of b12 def; not currently taking supplements. - Vitamin B12; Future - Vitamin B12  7. Polyp of colon, unspecified part of colon, unspecified type Due for repeat colonoscopy per patient; due to continued GERD sx and hx of ulcer will refer to GI for further eval/scope. - Ambulatory referral to Gastroenterology  8. Need for pneumococcal vaccination - Pneumococcal conjugate vaccine 13-valent IM  9. Need for influenza vaccination - Flu Vaccine QUAD 36+ mos IM  10. Cough - improved symptoms overall, but still with productive cough with dark phlegm. Xray in November (when dx and treat for pneumonia) was normal as was wbc  count. May need further evaluation pending labwork today.  Return pending labwork, xray.  Micheline Rough, MD

## 2018-11-26 LAB — LIPID PANEL
Cholesterol: 175 mg/dL (ref 0–200)
HDL: 45.2 mg/dL (ref >39.00)
LDL Cholesterol: 93 mg/dL (ref 0–99)
NonHDL: 129.83
Total CHOL/HDL Ratio: 4
Triglycerides: 186 mg/dL — ABNORMAL HIGH (ref 0.0–149.0)
VLDL: 37.2 mg/dL (ref 0.0–40.0)

## 2018-11-26 LAB — COMPREHENSIVE METABOLIC PANEL
ALT: 34 U/L (ref 0–53)
AST: 24 U/L (ref 0–37)
Albumin: 4.2 g/dL (ref 3.5–5.2)
Alkaline Phosphatase: 51 U/L (ref 39–117)
BUN: 20 mg/dL (ref 6–23)
CO2: 31 mEq/L (ref 19–32)
Calcium: 9.7 mg/dL (ref 8.4–10.5)
Chloride: 102 mEq/L (ref 96–112)
Creatinine, Ser: 1.6 mg/dL — ABNORMAL HIGH (ref 0.40–1.50)
GFR: 54.39 mL/min — ABNORMAL LOW (ref >60.00)
Glucose, Bld: 90 mg/dL (ref 70–99)
Potassium: 4.5 mEq/L (ref 3.5–5.1)
Sodium: 140 mEq/L (ref 135–145)
Total Bilirubin: 0.5 mg/dL (ref 0.2–1.2)
Total Protein: 6.7 g/dL (ref 6.0–8.3)

## 2018-11-26 LAB — CBC WITH DIFFERENTIAL/PLATELET
Basophils Absolute: 0.1 10*3/uL (ref 0.0–0.1)
Basophils Relative: 1.4 % (ref 0.0–3.0)
Eosinophils Absolute: 0.1 10*3/uL (ref 0.0–0.7)
Eosinophils Relative: 2.3 % (ref 0.0–5.0)
HCT: 41.7 % (ref 39.0–52.0)
Hemoglobin: 13.7 g/dL (ref 13.0–17.0)
Lymphocytes Relative: 42 % (ref 12.0–46.0)
Lymphs Abs: 2.3 10*3/uL (ref 0.7–4.0)
MCHC: 32.7 g/dL (ref 30.0–36.0)
MCV: 85.3 fl (ref 78.0–100.0)
Monocytes Absolute: 0.6 10*3/uL (ref 0.1–1.0)
Monocytes Relative: 10.9 % (ref 3.0–12.0)
Neutro Abs: 2.4 10*3/uL (ref 1.4–7.7)
Neutrophils Relative %: 43.4 % (ref 43.0–77.0)
Platelets: 142 10*3/uL — ABNORMAL LOW (ref 150.0–400.0)
RBC: 4.9 Mil/uL (ref 4.22–5.81)
RDW: 16.1 % — ABNORMAL HIGH (ref 11.5–15.5)
WBC: 5.5 10*3/uL (ref 4.0–10.5)

## 2018-11-26 LAB — VITAMIN B12: Vitamin B-12: 476 pg/mL (ref 211–911)

## 2018-11-26 LAB — HEMOGLOBIN A1C: Hgb A1c MFr Bld: 6.3 % (ref 4.6–6.5)

## 2018-11-26 LAB — BRAIN NATRIURETIC PEPTIDE: Pro B Natriuretic peptide (BNP): 431 pg/mL — ABNORMAL HIGH (ref 0.0–100.0)

## 2018-11-26 LAB — TSH: TSH: 2.17 u[IU]/mL (ref 0.35–4.50)

## 2018-11-30 DIAGNOSIS — G4733 Obstructive sleep apnea (adult) (pediatric): Secondary | ICD-10-CM | POA: Diagnosis not present

## 2018-12-02 ENCOUNTER — Ambulatory Visit: Payer: Medicare HMO | Admitting: Family Medicine

## 2018-12-10 ENCOUNTER — Other Ambulatory Visit: Payer: Self-pay | Admitting: Cardiology

## 2018-12-10 MED ORDER — SPIRONOLACTONE 25 MG PO TABS: ORAL_TABLET | ORAL | 3 refills | Status: DC

## 2018-12-10 NOTE — Telephone Encounter (Signed)
New Message     *STAT* If patient is at the pharmacy, call can be transferred to refill team.   1. Which medications need to be refilled? (please list name of each medication and dose if known) Spironolactane 25mg   2. Which pharmacy/location (including street and city if local pharmacy) is medication to be sent to? [facility] on [address]   3. Do they need a 30 day or 90 day supply? 90 day supply

## 2018-12-18 ENCOUNTER — Telehealth: Payer: Self-pay

## 2018-12-18 NOTE — Telephone Encounter (Signed)
Please advise 

## 2018-12-18 NOTE — Telephone Encounter (Signed)
That is fine. I wanted to let him get his specialty appointments completed and then return for physical in 2 months. Only thing that would change my response is if he is not feeling well.

## 2018-12-18 NOTE — Telephone Encounter (Signed)
Copied from [place] [id number]. Topic: General - Other >> Dec 17, 2018 10:57 AM Windy Kalata wrote: Reason for CRM: Patient called and states that Dr. Ethlyn Gallery wanted to see him after he saw his cardiologist but his appt is not until March 31st, does she still want to wait to see him after that or does he need to see her before that? Please advise  Best call back is [phone]

## 2018-12-18 NOTE — Telephone Encounter (Signed)
Discussed notes per Dr. Ethlyn Gallery with patient, he expressed understanding. Nothing further needed.

## 2018-12-22 ENCOUNTER — Encounter: Payer: Self-pay | Admitting: Gastroenterology

## 2018-12-22 ENCOUNTER — Ambulatory Visit (INDEPENDENT_AMBULATORY_CARE_PROVIDER_SITE_OTHER): Payer: Medicare HMO | Admitting: Gastroenterology

## 2018-12-22 ENCOUNTER — Ambulatory Visit (INDEPENDENT_AMBULATORY_CARE_PROVIDER_SITE_OTHER): Payer: Medicare HMO

## 2018-12-22 VITALS — BP 100/64 | HR 60 | Ht 71.5 in | Wt 234.2 lb

## 2018-12-22 DIAGNOSIS — I428 Other cardiomyopathies: Secondary | ICD-10-CM

## 2018-12-22 DIAGNOSIS — K625 Hemorrhage of anus and rectum: Secondary | ICD-10-CM | POA: Diagnosis not present

## 2018-12-22 LAB — CUP PACEART REMOTE DEVICE CHECK
Battery Remaining Longevity: 125 mo
Battery Voltage: 3.01 V
Brady Statistic RV Percent Paced: 0.2 %
Date Time Interrogation Session: 20200218083522
HighPow Impedance: 63 Ohm
Implantable Lead Implant Date: 20180517
Implantable Lead Location: 753860
Implantable Pulse Generator Implant Date: 20180517
Lead Channel Impedance Value: 304 Ohm
Lead Channel Impedance Value: 399 Ohm
Lead Channel Pacing Threshold Amplitude: 0.625 V
Lead Channel Pacing Threshold Pulse Width: 0.4 ms
Lead Channel Sensing Intrinsic Amplitude: 9.875 mV
Lead Channel Sensing Intrinsic Amplitude: 9.875 mV
Lead Channel Setting Pacing Amplitude: 2.5 V
Lead Channel Setting Pacing Pulse Width: 0.4 ms
Lead Channel Setting Sensing Sensitivity: 0.3 mV

## 2018-12-22 MED ORDER — PEG 3350-KCL-NA BICARB-NACL 420 G PO SOLR: 4000.0000 mL | ORAL | 0 refills | Status: DC

## 2018-12-22 NOTE — H&P (View-Only) (Signed)
    HPI: This is a very pleasant 57-year-old man who was referred to me by Koberlein, Junell C, MD  to evaluate rectal bleeding, history of polyps.    Chief complaint is rectal bleeding, history of polyps  Previous patient of Dr. David Robertson, it looks like his last office visit here was in 2011.  Dr. Patterson asked me to perform colonoscopy and upper endoscopy with MAC sedation in 2011.  EGD May 2011 found an irregular Z line which I biopsied to check for Barrett's.  Mild esophagitis and a 2 to 3 cm hiatal hernia.  Pathology showed no intestinal metaplasia.  Colonoscopy May 2011 found small hemorrhoids.  No polyps or cancers.  I recommend repeat screening examination at 10-year interval and follow-up with Dr. Patterson.  Colonoscopy Dr. Santa Robertson 2002 found a single hyperplastic polyp  Records reviewed: He has nonischemic cardiomyopathy with an ejection fraction of between 20 and 25%.  He has sleep apnea and is on CPAP for that.  Most recent echocardiogram was May 2019 confirming ejection fraction of 20 to 25%.  He has an implanted ICD 2018.  He had a CABG 2007.  Blood work January 2020 show normal CBC except for slightly low platelets at 142, complete metabolic profile was normal except for creatinine 1.6 which is around his chronic level.  BNP 431.    Sees blood in stool 2 times per month.  Will drip into the toilet.  Not only after difficult to move BMs, pretty rare constipation.    FH colon cancer (not definite).  Pyrosis, periodically.  Previously took h2 blockers.  Worse if he doesn't eat.  Spicy foods especially.  Gets bloated and belching >> than pyrosis.  He used to take H2 blockers but says they do not work for him.  He has never tried proton pump inhibitors that I can tell.  His biggest complaint this morning was with some mild shortness of breath that started overnight.  No fevers or chills.  He does have a chronic cough for several weeks.   Review of  systems: Pertinent positive and negative review of systems were noted in the above HPI section. All other review negative.   Past Medical History:  Diagnosis Date  . AICD (automatic cardioverter/defibrillator) present   . Anxiety   . Cardiomyopathy    Nonischemic. EF has been about 45%. S/P CABG.;  b.  Echo 4/14: EF 25%, global HK with inf and mid apical AK, restrictive physiology with E/e' > 15 (elevated LV filling pressure), trivial AI/MR, mild to mod LAE, mild RVE, mild reduced RVSF, mild RAE, mod TR, PASP 74 (severe pulmonary HTN)   . CHF (congestive heart failure) ([facility])   . CHF exacerbation ([facility]) 07/31/2016  . Colon polyps 2002  . Coronary artery disease   . Depression   . Dyslipidemia    hx (03/20/2017)  . GERD (gastroesophageal reflux disease)   . History of bleeding peptic ulcer   . History of echocardiogram    Echo 10/16:  EF 25-30%, poss non-compaction, diff HK with inf-lat and apical HK, restrictive physio, mild AI, severe LAE, mild RVE with mild reduced RVSF, PASP 42 mmHg  . Hyperplastic rectal polyp   . OSA on CPAP    Mild  . Tobacco abuse    Remote    Past Surgical History:  Procedure Laterality Date  . CARDIAC CATHETERIZATION  2007;  2009  . CARDIAC DEFIBRILLATOR PLACEMENT  03/20/2017  . CORONARY ARTERY BYPASS GRAFT  2007     Had a left main dissection after catheterization. Underwent a saphenous vein graft to the LAD and a saphenous vein graft to obtuse marginal.  . ICD IMPLANT N/A 03/20/2017   Procedure: ICD Implant;  Surgeon: Camnitz, Will Martin, MD;  Location: [facility];  Service: Cardiovascular;  Laterality: N/A;  . PATELLAR TENDON REPAIR Right 2005    Current Outpatient Medications  Medication Sig Dispense Refill  . carvedilol (COREG) 6.25 MG tablet Take 1 tablet (6.25 mg total) by mouth 2 (two) times daily with a meal. 60 tablet 6  . furosemide (LASIX) 40 MG tablet Take 40 mg by mouth daily.    . GLUCOSAMINE CHONDROITIN COMPLX PO Take 1,000 mg by  mouth.    . simvastatin (ZOCOR) 20 MG tablet TAKE 1 TABLET BY MOUTH AT BEDTIME 30 tablet 10  . spironolactone (ALDACTONE) 25 MG tablet TAKE 1/2 (ONE-HALF) TABLET BY MOUTH ONCE DAILY 45 tablet 3  . vitamin B-12 (CYANOCOBALAMIN) 1000 MCG tablet Take 1,000 mcg by mouth daily.    . zinc gluconate 50 MG tablet Take 50 mg by mouth daily.     No current facility-administered medications for this visit.     Allergies as of 12/22/2018  . (No Known Allergies)    Family History  Problem Relation Age of Onset  . Hypertension Mother   . Osteoarthritis Mother   . Heart failure Father 64  . High blood pressure Father   . Lung cancer Other   . Cancer Maternal Grandmother        uncertain type  . Bipolar disorder Daughter   . Diabetes Daughter   . Cancer Maternal Uncle        uncertain type  . Diabetes Niece     Social History   Socioeconomic History  . Marital status: Divorced    Spouse name: Not on file  . Number of children: 5  . Years of education: Not on file  . Highest education level: Not on file  Occupational History  . Occupation: Maintenence worker    Employer: [employer]  Social Needs  . Financial resource strain: Not on file  . Food insecurity:    Worry: Not on file    Inability: Not on file  . Transportation needs:    Medical: Not on file    Non-medical: Not on file  Tobacco Use  . Smoking status: Former Smoker    Packs/day: 0.10    Years: 7.00    Pack years: 0.70    Types: Cigarettes  . Smokeless tobacco: Never Used  . Tobacco comment: 03/20/2017 "quit before 2000"  Substance and Sexual Activity  . Alcohol use: Yes    Alcohol/week: 0.0 standard drinks    Comment: 03/20/2017 "might have 6 drinks/year @ the most"  . Drug use: Yes    Types: Marijuana    Comment: 03/20/2017 "joint qod"  . Sexual activity: Not Currently  Lifestyle  . Physical activity:    Days per week: Not on file    Minutes per session: Not on file  . Stress: Not on file  Relationships  .  Social connections:    Talks on phone: Not on file    Gets together: Not on file    Attends religious service: Not on file    Active member of club or organization: Not on file    Attends meetings of clubs or organizations: Not on file    Relationship status: Not on file  . Intimate partner violence:      Fear of current or ex partner: Not on file    Emotionally abused: Not on file    Physically abused: Not on file    Forced sexual activity: Not on file  Other Topics Concern  . Not on file  Social History Narrative   Single     Physical Exam: BP 100/64 (BP Location: Left Arm, Patient Position: Sitting, Cuff Size: Normal)   Pulse 60   Ht 5' 11.5" (1.816 m) Comment: height measured without shoes  Wt 234 lb 4 oz (106.3 kg)   BMI 32.22 kg/m  Constitutional: generally well-appearing Psychiatric: alert and oriented x3 Eyes: extraocular movements intact Mouth: oral pharynx moist, no lesions Neck: supple no lymphadenopathy Cardiovascular: heart regular rate and rhythm Lungs: clear to auscultation bilaterally Abdomen: soft, nontender, nondistended, no obvious ascites, no peritoneal signs, normal bowel sounds Extremities: no lower extremity edema bilaterally Skin: no lesions on visible extremities Rectal exam deferred for upcoming colonoscopy  Assessment and plan: 57 y.o. male with mild shortness of breath, minor rectal bleeding, dyspepsia and bloating  First he has very mild shortness of breath starting this morning.  No fevers or chills.  His lungs are clear to auscultation and he has no lower extremity edema.  He had recent pneumonia 2 or 3 months ago and has significant CHF and so I explained to him that if his breathing worsens or does not improve over the next day or 2 he needs to see his cardiologist or go to urgent care.  He has about twice monthly rectal bleeding, dripping of blood on the toilet.  I suspect this is minor anorectal benign process.  He knows he has external  hemorrhoids.  I recommended a trial of daily fiber supplements to see if that helps relieve some of his mild intermittent constipation, bulk his stools up leading to easier stooling all of which can help with hemorrhoidal symptoms.  I recommended a colonoscopy to exclude other causes since it has been almost 9 years since his last one.  As far as his dyspepsia, GERD-like symptoms I recommended a trial of once daily over-the-counter proton pump inhibitor omeprazole taken shortly before breakfast meal on a daily basis.  He has no dysphasia, no weight loss that would necessarily warrant upper endoscopy.   Please see the "Patient Instructions" section for addition details about the plan.   Bruce Treinen, MD [facility] 12/22/2018, 9:49 AM  Cc: Koberlein, Junell C, MD  

## 2018-12-22 NOTE — Patient Instructions (Addendum)
Please start taking citrucel (orange flavored) powder fiber supplement.  This may cause some bloating at first but that usually goes away. Begin with a small spoonful and work your way up to a large, heaping spoonful daily over a week.  Start OTC omeprazole 20mg  pills, one pill 20-30 min before breakfast meal daily.  You will be set up for a colonoscopy (at [facility]) for rectal bleeding.  If your breathing fails to improve or worsens you need to call your cardiologist.  Thank you for entrusting me with your care and choosing [facility].  Dr Ardis Hughs

## 2018-12-22 NOTE — Progress Notes (Signed)
HPI: This is a very pleasant 58 year old man who was referred to me by Caren Macadam, MD  to evaluate rectal bleeding, history of polyps.    Chief complaint is rectal bleeding, history of polyps  Previous patient of Dr. Verl Blalock, it looks like his last office visit here was in 2011.  Dr. Sharlett Iles asked me to perform colonoscopy and upper endoscopy with MAC sedation in 2011.  EGD May 2011 found an irregular Z line which I biopsied to check for Barrett's.  Mild esophagitis and a 2 to 3 cm hiatal hernia.  Pathology showed no intestinal metaplasia.  Colonoscopy May 2011 found small hemorrhoids.  No polyps or cancers.  I recommend repeat screening examination at 10-year interval and follow-up with Dr. Sharlett Iles.  Colonoscopy Dr. Corky Mull 2002 found a single hyperplastic polyp  Records reviewed: He has nonischemic cardiomyopathy with an ejection fraction of between 20 and 25%.  He has sleep apnea and is on CPAP for that.  Most recent echocardiogram was May 2019 confirming ejection fraction of 20 to 25%.  He has an implanted ICD 2018.  He had a CABG 2007.  Blood work January 2020 show normal CBC except for slightly low platelets at 142, complete metabolic profile was normal except for creatinine 1.6 which is around his chronic level.  BNP 431.    Sees blood in stool 2 times per month.  Will drip into the toilet.  Not only after difficult to move BMs, pretty rare constipation.    FH colon cancer (not definite).  Pyrosis, periodically.  Previously took h2 blockers.  Worse if he doesn't eat.  Spicy foods especially.  Gets bloated and belching >> than pyrosis.  He used to take H2 blockers but says they do not work for him.  He has never tried proton pump inhibitors that I can tell.  His biggest complaint this morning was with some mild shortness of breath that started overnight.  No fevers or chills.  He does have a chronic cough for several weeks.   Review of  systems: Pertinent positive and negative review of systems were noted in the above HPI section. All other review negative.   Past Medical History:  Diagnosis Date  . AICD (automatic cardioverter/defibrillator) present   . Anxiety   . Cardiomyopathy    Nonischemic. EF has been about 45%. S/P CABG.;  b.  Echo 4/14: EF 25%, global HK with inf and mid apical AK, restrictive physiology with E/e' > 15 (elevated LV filling pressure), trivial AI/MR, mild to mod LAE, mild RVE, mild reduced RVSF, mild RAE, mod TR, PASP 74 (severe pulmonary HTN)   . CHF (congestive heart failure) ([place])   . CHF exacerbation ([place]) 07/31/2016  . Colon polyps 2002  . Coronary artery disease   . Depression   . Dyslipidemia    hx (03/20/2017)  . GERD (gastroesophageal reflux disease)   . History of bleeding peptic ulcer   . History of echocardiogram    Echo 10/16:  EF 25-30%, poss non-compaction, diff HK with inf-lat and apical HK, restrictive physio, mild AI, severe LAE, mild RVE with mild reduced RVSF, PASP 42 mmHg  . Hyperplastic rectal polyp   . OSA on CPAP    Mild  . Tobacco abuse    Remote    Past Surgical History:  Procedure Laterality Date  . CARDIAC CATHETERIZATION  2007;  2009  . CARDIAC DEFIBRILLATOR PLACEMENT  03/20/2017  . CORONARY ARTERY BYPASS GRAFT  2007  Had a left main dissection after catheterization. Underwent a saphenous vein graft to the LAD and a saphenous vein graft to obtuse marginal.  . ICD IMPLANT N/A 03/20/2017   Procedure: ICD Implant;  Surgeon: Constance Haw, MD;  Location: [facility];  Service: Cardiovascular;  Laterality: N/A;  . PATELLAR TENDON REPAIR Right 2005    Current Outpatient Medications  Medication Sig Dispense Refill  . carvedilol (COREG) 6.25 MG tablet Take 1 tablet (6.25 mg total) by mouth 2 (two) times daily with a meal. 60 tablet 6  . furosemide (LASIX) 40 MG tablet Take 40 mg by mouth daily.    Marland Kitchen GLUCOSAMINE CHONDROITIN COMPLX PO Take 1,000 mg by  mouth.    . simvastatin (ZOCOR) 20 MG tablet TAKE 1 TABLET BY MOUTH AT BEDTIME 30 tablet 10  . spironolactone (ALDACTONE) 25 MG tablet TAKE 1/2 (ONE-HALF) TABLET BY MOUTH ONCE DAILY 45 tablet 3  . vitamin B-12 (CYANOCOBALAMIN) 1000 MCG tablet Take 1,000 mcg by mouth daily.    Marland Kitchen zinc gluconate 50 MG tablet Take 50 mg by mouth daily.     No current facility-administered medications for this visit.     Allergies as of 12/22/2018  . (No Known Allergies)    Family History  Problem Relation Age of Onset  . Hypertension Mother   . Osteoarthritis Mother   . Heart failure Father 62  . High blood pressure Father   . Lung cancer Other   . Cancer Maternal Grandmother        uncertain type  . Bipolar disorder Daughter   . Diabetes Daughter   . Cancer Maternal Uncle        uncertain type  . Diabetes Niece     Social History   Socioeconomic History  . Marital status: Divorced    Spouse name: Not on file  . Number of children: 5  . Years of education: Not on file  . Highest education level: Not on file  Occupational History  . Occupation: Hydrologist: [employer]  . Financial resource strain: Not on file  . Food insecurity:    Worry: Not on file    Inability: Not on file  . Transportation needs:    Medical: Not on file    Non-medical: Not on file  Tobacco Use  . Smoking status: Former Smoker    Packs/day: 0.10    Years: 7.00    Pack years: 0.70    Types: Cigarettes  . Smokeless tobacco: Never Used  . Tobacco comment: 03/20/2017 "quit before 2000"  Substance and Sexual Activity  . Alcohol use: Yes    Alcohol/week: 0.0 standard drinks    Comment: 03/20/2017 "might have 6 drinks/year @ the most"  . Drug use: Yes    Types: Marijuana    Comment: 03/20/2017 "joint qod"  . Sexual activity: Not Currently  Lifestyle  . Physical activity:    Days per week: Not on file    Minutes per session: Not on file  . Stress: Not on file  Relationships  .  Social connections:    Talks on phone: Not on file    Gets together: Not on file    Attends religious service: Not on file    Active member of club or organization: Not on file    Attends meetings of clubs or organizations: Not on file    Relationship status: Not on file  . Intimate partner violence:  Fear of current or ex partner: Not on file    Emotionally abused: Not on file    Physically abused: Not on file    Forced sexual activity: Not on file  Other Topics Concern  . Not on file  Social History Narrative   Single     Physical Exam: BP 100/64 (BP Location: Left Arm, Patient Position: Sitting, Cuff Size: Normal)   Pulse 60   Ht 5' 11.5" (1.816 m) Comment: height measured without shoes  Wt 234 lb 4 oz (106.3 kg)   BMI 32.22 kg/m  Constitutional: generally well-appearing Psychiatric: alert and oriented x3 Eyes: extraocular movements intact Mouth: oral pharynx moist, no lesions Neck: supple no lymphadenopathy Cardiovascular: heart regular rate and rhythm Lungs: clear to auscultation bilaterally Abdomen: soft, nontender, nondistended, no obvious ascites, no peritoneal signs, normal bowel sounds Extremities: no lower extremity edema bilaterally Skin: no lesions on visible extremities Rectal exam deferred for upcoming colonoscopy  Assessment and plan: 58 y.o. male with mild shortness of breath, minor rectal bleeding, dyspepsia and bloating  First he has very mild shortness of breath starting this morning.  No fevers or chills.  His lungs are clear to auscultation and he has no lower extremity edema.  He had recent pneumonia 2 or 3 months ago and has significant CHF and so I explained to him that if his breathing worsens or does not improve over the next day or 2 he needs to see his cardiologist or go to urgent care.  He has about twice monthly rectal bleeding, dripping of blood on the toilet.  I suspect this is minor anorectal benign process.  He knows he has external  hemorrhoids.  I recommended a trial of daily fiber supplements to see if that helps relieve some of his mild intermittent constipation, bulk his stools up leading to easier stooling all of which can help with hemorrhoidal symptoms.  I recommended a colonoscopy to exclude other causes since it has been almost 9 years since his last one.  As far as his dyspepsia, GERD-like symptoms I recommended a trial of once daily over-the-counter proton pump inhibitor omeprazole taken shortly before breakfast meal on a daily basis.  He has no dysphasia, no weight loss that would necessarily warrant upper endoscopy.   Please see the "Patient Instructions" section for addition details about the plan.   Owens Loffler, MD [facility] 12/22/2018, 9:49 AM  Cc: Caren Macadam, MD

## 2018-12-23 ENCOUNTER — Ambulatory Visit (INDEPENDENT_AMBULATORY_CARE_PROVIDER_SITE_OTHER): Payer: Medicare HMO | Admitting: Family Medicine

## 2018-12-23 ENCOUNTER — Telehealth: Payer: Self-pay | Admitting: Cardiology

## 2018-12-23 ENCOUNTER — Encounter: Payer: Self-pay | Admitting: Family Medicine

## 2018-12-23 ENCOUNTER — Ambulatory Visit (INDEPENDENT_AMBULATORY_CARE_PROVIDER_SITE_OTHER): Payer: Medicare HMO

## 2018-12-23 VITALS — BP 100/54 | HR 65 | Temp 98.4°F | Wt 231.2 lb

## 2018-12-23 DIAGNOSIS — M7702 Medial epicondylitis, left elbow: Secondary | ICD-10-CM | POA: Diagnosis not present

## 2018-12-23 DIAGNOSIS — D179 Benign lipomatous neoplasm, unspecified: Secondary | ICD-10-CM | POA: Diagnosis not present

## 2018-12-23 DIAGNOSIS — M25511 Pain in right shoulder: Secondary | ICD-10-CM

## 2018-12-23 MED ORDER — PREDNISONE 20 MG PO TABS: ORAL_TABLET | ORAL | 0 refills | Status: DC

## 2018-12-23 NOTE — Telephone Encounter (Signed)
Advised patient ok to use prep

## 2018-12-23 NOTE — Telephone Encounter (Signed)
New message   Patient is having a colonoscopy and is taking a sodium chloride before he has the procedure. Patient would like to know if this is okay to take since it is full of sodium. Please advise.

## 2018-12-23 NOTE — Patient Instructions (Addendum)
Golfer's Elbow    Golfer's elbow, also called medial epicondylitis, is a condition that results from inflammation of the strong bands of tissue (tendons) that attach your forearm muscles to the inside of your bone at the elbow. These tendons affect the muscles that bend the palm toward the wrist (flexion).  This condition is called golfer's elbow because it is more common among people who constantly bend and twist their wrists, such as golfers. This injury usually results from overuse. Tendons also become less flexible with age.  This condition causes elbow pain that may spread to your forearm and upper arm. The pain may get worse when you bend your wrist downward.  What are the causes?  This condition is an overuse injury that is caused by:  Repeatedly flexing, turning, or twisting your wrist.  Constantly gripping objects with your hands.  What increases the risk?  This condition is more likely to develop in people who play golf or tennis or have jobs that require the constant use of their hands. This injury is more common among:  Carpenters.  Gardeners.  Musicians.  Bricklayers.  Typists.  What are the signs or symptoms?  Symptoms of this condition include:  Pain near the inner elbow or forearm.  Reduced grip strength.  How is this diagnosed?  This condition is diagnosed based on your symptoms, medical history, and physical exam. During the exam, your health care provider may test your grip strength and move your wrist to check for pain. You may also have an MRI to confirm the diagnosis, look for other issues, and check for tears in the ligaments, muscles, or tendons.  How is this treated?  Treatment for this condition includes:  Stopping all activities that make you bend or twist your wrist until your pain and other symptoms go away.  Icing your wrist to relieve pain.  Taking NSAIDs or getting corticosteroid injections to reduce pain and swelling.  Doing stretches, range-of-motion, and strengthening exercises  (physical therapy) as told by your health care provider.  In rare cases, surgery may be needed if your condition does not improve.  Follow these instructions at home:  If directed, apply ice to the injured area.  Put ice in a plastic bag.  Place a towel between your skin and the bag.  Leave the ice on for 20 minutes, 2-3 times a day.  Move your fingers often to avoid stiffness.  Raise (elevate) the injured area above the level of your heart while you are sitting or lying down.  Return to your normal activities as told by your health care provider. Ask your health care provider what activities are safe for you.  Do exercises as told by your health care provider.  Do not use tobacco products, including cigarettes, chewing tobacco, or e-cigarettes. If you need help quitting, ask your health care provider.  Take over-the-counter and prescription medicines only as told by your health care provider.  Keep all follow-up visits as told by your health care provider. This is important.  How is this prevented?  Warm up and stretch before being active.  Cool down and stretch after being active.  Give your body time to rest between periods of activity.  Make sure to use equipment that fits you.  Be safe and responsible while being active to avoid falls.  Do at least 150 minutes of moderate-intensity exercise each week, such as brisk walking or water aerobics.  Maintain physical fitness, including:  Strength.  Flexibility.  Cardiovascular fitness.    shock in the arm when making contact with the ball, if you play golf. Contact a health care provider if:  Your pain does not improve or it gets worse.  You notice numbness in your hand. Get help right away if:  Your pain is severe.  You cannot move your wrist. This information is not intended to replace  advice given to you by your health care provider. Make sure you discuss any questions you have with your health care provider. Document Released: 10/21/2005 Document Revised: 07/10/2018 Document Reviewed: 07/03/2015 Elsevier Interactive Patient Education  2019 Else[employer] Inc.  Lipoma  A lipoma is a noncancerous (benign) tumor that is made up of fat cells. This is a very common type of soft-tissue growth. Lipomas are usually found under the skin (subcutaneous). They may occur in any tissue of the body that contains fat. Common areas for lipomas to appear include the back, shoulders, buttocks, and thighs.  Lipomas grow slowly, and they are usually painless. Most lipomas do not cause problems and do not require treatment. What are the causes? The cause of this condition is not known. What increases the risk? You are more likely to develop this condition if:  You are 69-67 years old.  You have a family history of lipomas. What are the signs or symptoms? A lipoma usually appears as a small, round bump under the skin. In most cases, the lump will:  Feel soft or rubbery.  Not cause pain or other symptoms. However, if a lipoma is located in an area where it pushes on nerves, it can become painful or cause other symptoms. How is this diagnosed? A lipoma can usually be diagnosed with a physical exam. You may also have tests to confirm the diagnosis and to rule out other conditions. Tests may include:  Imaging tests, such as a CT scan or MRI.  Removal of a tissue sample to be looked at under a microscope (biopsy). How is this treated? Treatment for this condition depends on the size of the lipoma and whether it is causing any symptoms.  For small lipomas that are not causing problems, no treatment is needed.  If a lipoma is bigger or it causes problems, surgery may be done to remove the lipoma. Lipomas can also be removed to improve appearance. Most often, the procedure is done after applying a  medicine that numbs the area (local anesthetic). Follow these instructions at home:  Watch your lipoma for any changes.  Keep all follow-up visits as told by your health care provider. This is important. Contact a health care provider if:  Your lipoma becomes larger or hard.  Your lipoma becomes painful, red, or increasingly swollen. These could be signs of infection or a more serious condition. Get help right away if:  You develop tingling or numbness in an area near the lipoma. This could indicate that the lipoma is causing nerve damage. Summary  A lipoma is a noncancerous tumor that is made up of fat cells.  Most lipomas do not cause problems and do not require treatment.  If a lipoma is bigger or it causes problems, surgery may be done to remove the lipoma. This information is not intended to replace advice given to you by your health care provider. Make sure you discuss any questions you have with your health care provider. Document Released: 10/11/2002 Document Revised: 10/07/2017 Document Reviewed: 10/07/2017 Elsevier Interactive Patient Education  2019 [employer].

## 2018-12-23 NOTE — Progress Notes (Signed)
Bruce Robertson DOB: Aug 03, 1961 Encounter date: 12/23/2018  This is a 58 y.o. male who presents with Chief Complaint  Patient presents with  . Shoulder Pain    right shoulder, left elbow, reoccuring pain x 7 months, has worsened, pain 10/10, aching pain in shoulder aching pain in elbow    History of present illness:  Right shoulder pain wakes him up at night.   Pain started in the gym; thought he just hurt it working out. Stopped for awhile. Tried to recently start back in gym a month ago. Works around shoulder, but feels like he can't do anything. Limited with ROM to 90 deg with abduction. Pain even with bicep curl right. Tricep work ok. Shoulder been bothering him for 6-7 months. Thought it would resolve with rest. Progressively worse/ Pain up to 10/10. Thinks initial injury was when doing [employer].  Knot on left elbow. Hurts between knot and elbow. Knot been there a year, but elbow not hurting until a couple of months ago.     No Known Allergies Current Meds  Medication Sig  . carvedilol (COREG) 6.25 MG tablet Take 1 tablet (6.25 mg total) by mouth 2 (two) times daily with a meal.  . furosemide (LASIX) 40 MG tablet Take 40 mg by mouth daily.  Marland Kitchen GLUCOSAMINE CHONDROITIN COMPLX PO Take 1,000 mg by mouth.  . polyethylene glycol-electrolytes (NULYTELY/GOLYTELY) 420 g solution Take 4,000 mLs by mouth as directed.  . simvastatin (ZOCOR) 20 MG tablet TAKE 1 TABLET BY MOUTH AT BEDTIME  . spironolactone (ALDACTONE) 25 MG tablet TAKE 1/2 (ONE-HALF) TABLET BY MOUTH ONCE DAILY  . vitamin B-12 (CYANOCOBALAMIN) 1000 MCG tablet Take 1,000 mcg by mouth daily.  Marland Kitchen zinc gluconate 50 MG tablet Take 50 mg by mouth daily.    Review of Systems  Constitutional: Negative for activity change and fatigue.  Musculoskeletal: Positive for arthralgias (see hpi).    Objective:  BP (!) 100/54 (BP Location: Left Arm, Patient Position: Sitting, Cuff Size: Large)   Pulse 65   Temp 98.4 F (36.9 C)  (Oral)   Wt 231 lb 3.2 oz (104.9 kg)   SpO2 96%   BMI 31.80 kg/m   Weight: 231 lb 3.2 oz (104.9 kg)   BP Readings from Last 3 Encounters:  12/23/18 (!) 100/54  12/22/18 100/64  11/25/18 110/62   Wt Readings from Last 3 Encounters:  12/23/18 231 lb 3.2 oz (104.9 kg)  12/22/18 234 lb 4 oz (106.3 kg)  11/25/18 236 lb 12.8 oz (107.4 kg)    Physical Exam Constitutional:      Appearance: Normal appearance.  Musculoskeletal:     Comments: There is medial epicondylar tenderness and pain with supination of left hand. There is no notable weakness of left arm/hand.   There is a soft, slightly irregular, mobile fatty mass left forearm approx 2.5cm in size. Nontender. This is felt to be lipoma.  Abduction limited below 90 deg right shoulder. No obvious visible abnormality of shoulder. There is still pain with passive motion over 100 deg. There is anterior and lateral tenderness to palpation.  + hawkins. + pain with any external or internal rotation of shoulder. Strength testing of shoulder limited due to pain. There is no appreciated biceps tenderness but there is slight pain with strength testing of biceps. There is also tenderness right pectoral musculature.  Neurological:     Mental Status: He is alert.     Assessment/Plan 1. Acute pain of right shoulder We are going to try prednisone  burst. I am hopeful that this will help with both shoulder and elbow pain (see below). I suspect rotator cuff injury due to limitation of movement, continued pain over months, lack of improvement with rest. I think MRI would be reasonable if not noting significant improvement with prednisone and rest and xray is not revealing. OK for referral to ortho for further treatment pending above. - predniSONE (DELTASONE) 20 MG tablet; Take 3 tablets daily x 3 days, 2 tablets daily x 3 days, 1 tablet daily x 3 days, 1/2 tablet daily x 2 days.  Dispense: 19 tablet; Refill: 0 - DG Shoulder Right; Future  2. Lipoma,  unspecified site Non tender. Discussed with patient. Will continue to monitor.  3. Medial epicondylitis of left elbow Tennis elbow band recommended. Limit exercises that strain elbow x 2 weeks. Prednisone. Follow up pending response. - predniSONE (DELTASONE) 20 MG tablet; Take 3 tablets daily x 3 days, 2 tablets daily x 3 days, 1 tablet daily x 3 days, 1/2 tablet daily x 2 days.  Dispense: 19 tablet; Refill: 0     Return pending xray; response to prednisone.    Micheline Rough, MD

## 2018-12-30 NOTE — Progress Notes (Signed)
Remote ICD transmission.   

## 2018-12-31 ENCOUNTER — Institutional Professional Consult (permissible substitution): Payer: Medicare HMO | Admitting: Pulmonary Disease

## 2018-12-31 DIAGNOSIS — G4733 Obstructive sleep apnea (adult) (pediatric): Secondary | ICD-10-CM | POA: Diagnosis not present

## 2019-01-01 ENCOUNTER — Telehealth: Payer: Self-pay | Admitting: Family Medicine

## 2019-01-01 NOTE — Telephone Encounter (Signed)
Copied from [place] [id number]. Topic: Quick Communication - See Telephone Encounter >> Jan 01, 2019 11:00 AM Bruce Robertson wrote: CRM for notification. See Telephone encounter for: 01/01/19.  Patient would like Dr Ethlyn Gallery to know that the prednisone has helped about 95% of free motion pain with his shoulder. When he picks something up, it does hurt. It did not do anything for his elbow. He would like to have the MRI.

## 2019-01-01 NOTE — Telephone Encounter (Signed)
95%for shoulder improvement is really a great response. Bummer about the elbow, however.   Can you elaborate on pain with picking things up in the shoulder? Is he feeling any stronger in shoulder at this point? Has he resumed any of activities? If strength has also improved, then I would suggest follow up with ortho for evaluation rather than jumping into MRI. If his range of motion is still limited and he is still having pain with activity then we can definitely do the MRI.   If he has not had improvement with limiting use of that elbow and wearing tennis elbow band, and with prednisone he took, then I think he should consider seeing ortho for this. He is so active and likes to lift weights and I would feel more comfortable with them assessing and doing injection if they feel it is needed.   Let me know how he wishes to proceed so orders can be put in. If he wants to go through ortho then ok for referral.

## 2019-01-04 ENCOUNTER — Encounter: Payer: Self-pay | Admitting: Cardiology

## 2019-01-04 NOTE — Telephone Encounter (Signed)
First just make sure when checking blood sugar that it has been at least 2 hours (3 is better) since eating. This gives the fasting sugar. Low carbohydrate diet is key. Keeping carbohydrate servings to 3 or 4 per meal is a good goal. Non-starchy vegetables and lean protein focused meals are good. I do believe he eats pretty healthy overall.   We can mail him the packet that goes through serving size/ carb serving size if he would like? We could also refer to dietician to discuss one on one. Since he likes to work out and is already eating on healthier side this may be good to really make clear to him what he can do better.   And certainly we should recheck A1C q 3-6 months to make sure stable and not elevating.

## 2019-01-04 NOTE — Telephone Encounter (Signed)
Spoke with patient. He stated that he has full range of motion without pain. His shoulder is only painful with lifting things or putting pressure on it. He has started going back to the gym. Patient has agreed to Ortho referral.  Patient stated that his A1C was in pre diabetic range so he checked his BG yesterday and it was 172. Patient would like to know if you have any recommendations to keep A1C from going up.  Please advise.

## 2019-01-04 NOTE — Telephone Encounter (Signed)
Spoke with patient. He expressed understanding. Handouts have been mailed. Declined referral to dietitian at this time.

## 2019-01-11 NOTE — Telephone Encounter (Signed)
Patient states he does not have an appt with ortho yet, they have not called him & yes he would like to try something else for pain. Thanks

## 2019-01-11 NOTE — Telephone Encounter (Signed)
We would really like to avoid this so we don't elevate sugars. When is appointment with ortho? Would he be willing to try something else for pain?

## 2019-01-11 NOTE — Telephone Encounter (Signed)
Left message for patient to call back. CRM created 

## 2019-01-11 NOTE — Telephone Encounter (Signed)
Patient stated now that the predniSONE (DELTASONE) 20 MG tablet medication is gone the pain in his shoulder is back again and it's just as severe in the morning and even worse at night.  He wants to know if the Prednisone medication can be refilled.

## 2019-01-12 ENCOUNTER — Other Ambulatory Visit: Payer: Self-pay | Admitting: Family Medicine

## 2019-01-12 DIAGNOSIS — M25522 Pain in left elbow: Secondary | ICD-10-CM

## 2019-01-12 DIAGNOSIS — M25511 Pain in right shoulder: Secondary | ICD-10-CM

## 2019-01-12 MED ORDER — TRAMADOL HCL 50 MG PO TABS: 50.0000 mg | ORAL_TABLET | Freq: Two times a day (BID) | ORAL | 0 refills | Status: DC | PRN

## 2019-01-12 NOTE — Telephone Encounter (Signed)
ATC, "call can not be completed at this time." CRM created.

## 2019-01-12 NOTE — Telephone Encounter (Signed)
Looks like ortho referral wasn't placed during previous phone calls so I have put this in now. He should hear in next couple of weeks.   I sent in tramadol for him to use with more severe pain; I would like for him to try Extra strength tylenol for moderate pain. Let me know if this is not working or if pain is worsening.

## 2019-01-14 ENCOUNTER — Institutional Professional Consult (permissible substitution): Payer: Medicare HMO | Admitting: Pulmonary Disease

## 2019-01-15 ENCOUNTER — Other Ambulatory Visit: Payer: Self-pay

## 2019-01-15 ENCOUNTER — Encounter (INDEPENDENT_AMBULATORY_CARE_PROVIDER_SITE_OTHER): Payer: Self-pay | Admitting: Physician Assistant

## 2019-01-15 ENCOUNTER — Ambulatory Visit (INDEPENDENT_AMBULATORY_CARE_PROVIDER_SITE_OTHER): Payer: Medicare HMO | Admitting: Physician Assistant

## 2019-01-15 ENCOUNTER — Ambulatory Visit (INDEPENDENT_AMBULATORY_CARE_PROVIDER_SITE_OTHER): Payer: Medicare HMO

## 2019-01-15 DIAGNOSIS — M25511 Pain in right shoulder: Secondary | ICD-10-CM

## 2019-01-15 DIAGNOSIS — M25522 Pain in left elbow: Secondary | ICD-10-CM

## 2019-01-15 MED ORDER — METHYLPREDNISOLONE ACETATE 40 MG/ML IJ SUSP: 40.0000 mg | Freq: Once | INTRAMUSCULAR | Status: DC

## 2019-01-15 NOTE — Progress Notes (Signed)
S:  Here for ultrasound-guided right AC injection.  O:  Tender at ACJ, and pain with empty can.  Procedure: Ultrasound-guided right shoulder AC joint injection: After sterile prep with Betadine, injected 1 cc 1% lidocaine without epinephrine and 40 mg methylprednisolone into the [facility] joint without complication.  He had improvement in [facility] area pain, but still some pain with empty can test immediately after the procedure.  Follow-up as directed.

## 2019-01-15 NOTE — Progress Notes (Signed)
Office Visit Note   Patient: Bruce Robertson           Date of Birth: Mar 06, 1961           MRN: [id number] Visit Date: 01/15/2019              Requested by: Caren Macadam, MD [place], [place] [id number] PCP: Caren Macadam, MD   Assessment & Plan: Visit Diagnoses:  1. Pain in left elbow     Plan: Impression is right shoulder AC joint arthropathy and rotator cuff tendinitis and left elbow triceps tendinitis with attachment spurring.  In regards to the right shoulder, we will refer the patient to Dr. Junius Roads for an [facility] joint injection under ultrasound.  Patient will follow-up with [place] in 2 weeks time.  If he is still having pain to the right shoulder, we will inject the subacromial space with cortisone.  We will also discuss definitive treatment of his left elbow at that appointment as Dr. Sherrian Divers will be back in the office.  Follow-Up Instructions: Return in about 2 weeks (around 01/29/2019).   Orders:  Orders Placed This Encounter  Procedures  . XR Elbow Complete Left (3+View)   No orders of the defined types were placed in this encounter.     Procedures: No procedures performed   Clinical Data: No additional findings.   Subjective: Chief Complaint  Patient presents with  . Right Shoulder - Pain  . Left Elbow - Pain    HPI patient is a pleasant 58 year old gentleman who presents our clinic today with right shoulder and left elbow pain.  In regards to the right shoulder, he has had pain for approximately 6 months.  The pain has significantly worsened over the past several months.  No known injury or change in activity.  He does note that he does a significant amount of working out at [employer].  With pain in his shoulder is not only to the [facility] joint but into the deltoid and occasionally down the forearm.  He has increased pain when he is working out as well as with internal rotation, abduction and sleeping on the right side.  Is tried over-the-counter  medications without relief of symptoms.  In regards to the left elbow, he has had pain over the olecranon for the past several months.  No new injury or change in activity.  Pain is worse with elbow extension with and without resistance.  No significant weakness.  No numbness, tingling or burning.  Review of Systems as detailed in HPI.  All others reviewed and are negative.   Objective: Vital Signs: There were no vitals taken for this visit.  Physical Exam well-developed and well-nourished gentleman in no acute distress.  Alert and oriented x3.  Ortho Exam examination of his right shoulder reveals 50% active range of motion all planes.  This does appear to be pain mediated.  He can internally rotate to L5.  He has significant tenderness over the [facility] joint.  Markedly positive empty can and cross body adduction.  Negative drop arm.  He is neurovascularly intact distally.  Examination of the left elbow reveals no swelling.  Full elbow extension and flexion.  He does have marked pain with resisted elbow extension.  No focal weakness.  He is neurovascularly intact distally.  Specialty Comments:  No specialty comments available.  Imaging: Xr Elbow Complete Left (3+view)  Result Date: 01/15/2019 X-rays demonstrate significant tricep attachment spurring  X-rays of the right shoulder  reviewed by me in canopy reveal moderate AC joint space narrowing.  No other acute findings.  PMFS History: Patient Active Problem List   Diagnosis Date Noted  . Prediabetes 06/10/2018  . Thrombocytopenia ([place]) 06/10/2018  . AICD (automatic cardioverter/defibrillator) present 06/10/2018  . History of peptic ulcer disease 06/10/2018  . Non-ischemic cardiomyopathy ([place])   . NSVT (nonsustained ventricular tachycardia) ([place])   . Hypotension due to drugs 02/09/2018  . Polyp of colon 04/29/2016  . Dyspnea on exertion 03/05/2016  . Essential hypertension 10/31/2015  . Acute on chronic systolic heart failure ([place])  08/29/2015  . Hx of CABG 08/29/2015  . Stage 3 chronic kidney disease ([place]) 08/29/2015  . OSA (obstructive sleep apnea) 06/10/2011  . Fatigue 06/10/2011  . Obesity, unspecified 05/02/2010  . HEMORRHOIDS, EXTERNAL 02/08/2010  . ESOPHAGEAL REFLUX 02/08/2010  . RECTAL BLEEDING 02/08/2010  . Hyperlipidemia 04/06/2009  . TOBACCO USER 04/06/2009  . Cardiomyopathy, dilated, nonischemic ([place]) 04/06/2009   Past Medical History:  Diagnosis Date  . AICD (automatic cardioverter/defibrillator) present   . Anxiety   . Cardiomyopathy    Nonischemic. EF has been about 45%. S/P CABG.;  b.  Echo 4/14: EF 25%, global HK with inf and mid apical AK, restrictive physiology with E/e' > 15 (elevated LV filling pressure), trivial AI/MR, mild to mod LAE, mild RVE, mild reduced RVSF, mild RAE, mod TR, PASP 74 (severe pulmonary HTN)   . CHF (congestive heart failure) ([place])   . CHF exacerbation ([place]) 07/31/2016  . Colon polyps 2002  . Coronary artery disease   . Depression   . Dyslipidemia    hx (03/20/2017)  . GERD (gastroesophageal reflux disease)   . History of bleeding peptic ulcer   . History of echocardiogram    Echo 10/16:  EF 25-30%, poss non-compaction, diff HK with inf-lat and apical HK, restrictive physio, mild AI, severe LAE, mild RVE with mild reduced RVSF, PASP 42 mmHg  . Hyperplastic rectal polyp   . OSA on CPAP    Mild  . Tobacco abuse    Remote    Family History  Problem Relation Age of Onset  . Hypertension Mother   . Osteoarthritis Mother   . Heart failure Father 73  . High blood pressure Father   . Lung cancer Other   . Cancer Maternal Grandmother        uncertain type  . Bipolar disorder Daughter   . Diabetes Daughter   . Cancer Maternal Uncle        uncertain type  . Diabetes Niece     Past Surgical History:  Procedure Laterality Date  . CARDIAC CATHETERIZATION  2007;  2009  . CARDIAC DEFIBRILLATOR PLACEMENT  03/20/2017  . CORONARY ARTERY BYPASS GRAFT  2007   Had a left  main dissection after catheterization. Underwent a saphenous vein graft to the LAD and a saphenous vein graft to obtuse marginal.  . ICD IMPLANT N/A 03/20/2017   Procedure: ICD Implant;  Surgeon: Constance Haw, MD;  Location: [facility] [place] [facility];  Service: Cardiovascular;  Laterality: N/A;  . PATELLAR TENDON REPAIR Right 2005   Social History   Occupational History  . Occupation: Civil engineer, contracting    Employer: [employer]  Tobacco Use  . Smoking status: Former Smoker    Packs/day: 0.10    Years: 7.00    Pack years: 0.70    Types: Cigarettes  . Smokeless tobacco: Never Used  . Tobacco comment: 03/20/2017 "quit before 2000"  Substance and Sexual  Activity  . Alcohol use: Yes    Alcohol/week: 0.0 standard drinks    Comment: 03/20/2017 "might have 6 drinks/year @ the most"  . Drug use: Yes    Types: Marijuana    Comment: 03/20/2017 "joint qod"  . Sexual activity: Not Currently

## 2019-01-19 ENCOUNTER — Encounter (HOSPITAL_COMMUNITY): Payer: Self-pay | Admitting: *Deleted

## 2019-01-21 ENCOUNTER — Encounter (HOSPITAL_COMMUNITY): Payer: Self-pay | Admitting: *Deleted

## 2019-01-21 ENCOUNTER — Ambulatory Visit (HOSPITAL_COMMUNITY): Payer: Medicare HMO | Admitting: Anesthesiology

## 2019-01-21 ENCOUNTER — Other Ambulatory Visit: Payer: Self-pay

## 2019-01-21 ENCOUNTER — Encounter (HOSPITAL_COMMUNITY)
Admission: RE | Disposition: A | Discharge status: Home / Self Care | Payer: Self-pay | Source: Home / Self Care | Attending: Gastroenterology

## 2019-01-21 ENCOUNTER — Ambulatory Visit (HOSPITAL_COMMUNITY)
Admission: RE | Admit: 2019-01-21 | Discharge: 2019-01-21 | Disposition: A | Discharge status: Home / Self Care | Payer: Medicare HMO | Attending: Gastroenterology | Admitting: Gastroenterology

## 2019-01-21 DIAGNOSIS — E669 Obesity, unspecified: Secondary | ICD-10-CM | POA: Diagnosis not present

## 2019-01-21 DIAGNOSIS — I2510 Atherosclerotic heart disease of native coronary artery without angina pectoris: Secondary | ICD-10-CM | POA: Insufficient documentation

## 2019-01-21 DIAGNOSIS — I428 Other cardiomyopathies: Secondary | ICD-10-CM | POA: Insufficient documentation

## 2019-01-21 DIAGNOSIS — Z87891 Personal history of nicotine dependence: Secondary | ICD-10-CM | POA: Insufficient documentation

## 2019-01-21 DIAGNOSIS — K644 Residual hemorrhoidal skin tags: Secondary | ICD-10-CM | POA: Insufficient documentation

## 2019-01-21 DIAGNOSIS — I110 Hypertensive heart disease with heart failure: Secondary | ICD-10-CM | POA: Diagnosis not present

## 2019-01-21 DIAGNOSIS — D122 Benign neoplasm of ascending colon: Secondary | ICD-10-CM | POA: Diagnosis not present

## 2019-01-21 DIAGNOSIS — K625 Hemorrhage of anus and rectum: Secondary | ICD-10-CM | POA: Insufficient documentation

## 2019-01-21 DIAGNOSIS — K649 Unspecified hemorrhoids: Secondary | ICD-10-CM

## 2019-01-21 DIAGNOSIS — Z951 Presence of aortocoronary bypass graft: Secondary | ICD-10-CM | POA: Insufficient documentation

## 2019-01-21 DIAGNOSIS — Z95810 Presence of automatic (implantable) cardiac defibrillator: Secondary | ICD-10-CM | POA: Diagnosis not present

## 2019-01-21 DIAGNOSIS — E785 Hyperlipidemia, unspecified: Secondary | ICD-10-CM | POA: Insufficient documentation

## 2019-01-21 DIAGNOSIS — Z6830 Body mass index (BMI) 30.0-30.9, adult: Secondary | ICD-10-CM | POA: Insufficient documentation

## 2019-01-21 DIAGNOSIS — K648 Other hemorrhoids: Secondary | ICD-10-CM | POA: Diagnosis not present

## 2019-01-21 DIAGNOSIS — G4733 Obstructive sleep apnea (adult) (pediatric): Secondary | ICD-10-CM | POA: Insufficient documentation

## 2019-01-21 DIAGNOSIS — I509 Heart failure, unspecified: Secondary | ICD-10-CM | POA: Insufficient documentation

## 2019-01-21 DIAGNOSIS — Z79899 Other long term (current) drug therapy: Secondary | ICD-10-CM | POA: Insufficient documentation

## 2019-01-21 HISTORY — PX: POLYPECTOMY: SHX5525

## 2019-01-21 HISTORY — PX: COLONOSCOPY WITH PROPOFOL: SHX5780

## 2019-01-21 SURGERY — COLONOSCOPY WITH PROPOFOL
Anesthesia: Monitor Anesthesia Care

## 2019-01-21 MED ORDER — PROPOFOL 10 MG/ML IV BOLUS
INTRAVENOUS | Status: AC
  Filled 2019-01-21: qty 80

## 2019-01-21 MED ORDER — PROPOFOL 10 MG/ML IV BOLUS
INTRAVENOUS | Status: DC | PRN
  Administered 2019-01-21: 10 mg via INTRAVENOUS

## 2019-01-21 MED ORDER — SODIUM CHLORIDE 0.9 % IV SOLN
INTRAVENOUS | Status: DC
  Administered 2019-01-21: 07:00:00 via INTRAVENOUS

## 2019-01-21 MED ORDER — PROPOFOL 500 MG/50ML IV EMUL
INTRAVENOUS | Status: DC | PRN
  Administered 2019-01-21: 110 ug/kg/min via INTRAVENOUS

## 2019-01-21 SURGICAL SUPPLY — 22 items

## 2019-01-21 NOTE — Anesthesia Postprocedure Evaluation (Signed)
Anesthesia Post Note  Patient: Bruce Robertson  Procedure(s) Performed: COLONOSCOPY WITH PROPOFOL (N/A )     Patient location during evaluation: Endoscopy Anesthesia Type: MAC Level of consciousness: awake and alert Pain management: pain level controlled Vital Signs Assessment: post-procedure vital signs reviewed and stable Respiratory status: spontaneous breathing, nonlabored ventilation, respiratory function stable and patient connected to nasal cannula oxygen Cardiovascular status: stable and blood pressure returned to baseline Postop Assessment: no apparent nausea or vomiting Anesthetic complications: no    Last Vitals:  Vitals:   01/21/19 0815 01/21/19 0820  BP:  108/84  Pulse: (!) 48 (!) 50  Resp: (!) 22 18  Temp:    SpO2: 100% 99%    Last Pain:  Vitals:   01/21/19 0820  TempSrc:   PainSc: 0-No pain                 Catalina Gravel

## 2019-01-21 NOTE — Interval H&P Note (Signed)
History and Physical Interval Note:  01/21/2019 7:06 AM  Bruce Robertson  has presented today for surgery, with the diagnosis of Rectal bleeding.  The various methods of treatment have been discussed with the patient and family. After consideration of risks, benefits and other options for treatment, the patient has consented to  Procedure(s): COLONOSCOPY WITH PROPOFOL (N/A) as a surgical intervention.  The patient's history has been reviewed, patient examined, no change in status, stable for surgery.  I have reviewed the patient's chart and labs.  Questions were answered to the patient's satisfaction.     Milus Banister

## 2019-01-21 NOTE — Transfer of Care (Signed)
Immediate Anesthesia Transfer of Care Note  Patient: Bruce Robertson  Procedure(s) Performed: COLONOSCOPY WITH PROPOFOL (N/A )  Patient Location: PACU  Anesthesia Type:MAC  Level of Consciousness: awake, alert  and oriented  Airway & Oxygen Therapy: Patient Spontanous Breathing and Patient connected to face mask oxygen  Post-op Assessment: Report given to RN and Post -op Vital signs reviewed and stable  Post vital signs: Reviewed and stable  Last Vitals:  Vitals Value Taken Time  BP    Temp    Pulse 46 01/21/2019  7:49 AM  Resp 16 01/21/2019  7:49 AM  SpO2 100 % 01/21/2019  7:49 AM  Vitals shown include unvalidated device data.  Last Pain:  Vitals:   01/21/19 0634  TempSrc: Oral  PainSc: 0-No pain         Complications: No apparent anesthesia complications

## 2019-01-21 NOTE — Anesthesia Preprocedure Evaluation (Signed)
Anesthesia Evaluation  Patient identified by MRN, date of birth, ID band Patient awake    Reviewed: Allergy & Precautions, NPO status , Patient's Chart, lab work & pertinent test results, reviewed documented beta blocker date and time   Airway Mallampati: II  TM Distance: >3 FB Neck ROM: Full    Dental  (+) Teeth Intact, Dental Advisory Given   Pulmonary sleep apnea and Continuous Positive Airway Pressure Ventilation , former smoker,    Pulmonary exam normal breath sounds clear to auscultation       Cardiovascular hypertension, Pt. on home beta blockers and Pt. on medications + CAD, + CABG and +CHF  Normal cardiovascular exam+ Cardiac Defibrillator  Rhythm:Regular Rate:Normal  Echo 04/01/18: Study Conclusions  - Left ventricle: The cavity size was severely dilated. Systolic function was severely reduced. The estimated ejection fraction was in the range of 20% to 25%. Diffuse hypokinesis. Features are consistent with a pseudonormal left ventricular filling pattern, with concomitant abnormal relaxation and increased filling   pressure (grade 2 diastolic dysfunction). Doppler parameters are consistent with high ventricular filling pressure. - Aortic valve: There was mild regurgitation. - Mitral valve: Transvalvular velocity was within the normal range. There was no evidence for stenosis. There was trivial regurgitation. - Left atrium: The atrium was mildly dilated. - Right ventricle: The cavity size was normal. Wall thickness was normal. Systolic function was mildly reduced. - Atrial septum: No defect or patent foramen ovale was identified by color flow Doppler. - Tricuspid valve: There was moderate regurgitation. - Pulmonary arteries: Systolic pressure was mildly increased. PA peak pressure: 44 mm Hg (S).   Neuro/Psych PSYCHIATRIC DISORDERS Anxiety Depression negative neurological ROS     GI/Hepatic Neg liver ROS, GERD  ,Rectal  polyps   Endo/Other  Obesity   Renal/GU Renal InsufficiencyRenal disease     Musculoskeletal negative musculoskeletal ROS (+)   Abdominal   Peds  Hematology negative hematology ROS (+)   Anesthesia Other Findings Day of surgery medications reviewed with the patient.  Reproductive/Obstetrics                             Anesthesia Physical Anesthesia Plan  ASA: IV  Anesthesia Plan: MAC   Post-op Pain Management:    Induction: Intravenous  PONV Risk Score and Plan: 1 and Propofol infusion and Treatment may vary due to age or medical condition  Airway Management Planned: Natural Airway and Simple Face Mask  Additional Equipment:   Intra-op Plan:   Post-operative Plan:   Informed Consent: I have reviewed the patients History and Physical, chart, labs and discussed the procedure including the risks, benefits and alternatives for the proposed anesthesia with the patient or authorized representative who has indicated his/her understanding and acceptance.     Dental advisory given  Plan Discussed with: CRNA and Anesthesiologist  Anesthesia Plan Comments:         Anesthesia Quick Evaluation

## 2019-01-21 NOTE — Op Note (Signed)
[facility] Patient Name: Bruce Robertson Procedure Date: 01/21/2019 MRN: [id number] Attending MD: Milus Banister , MD Date of Birth: 11/18/1960 CSN: [id number] Age: 58 Admit Type: Outpatient Procedure:                Colonoscopy Indications:              Rectal bleeding Providers:                Milus Banister, MD, Elmer Ramp. Tilden Dome, RN, Charolette Child, Technician, Leandro Reasoner, CRNA Referring MD:              Medicines:                Monitored Anesthesia Care Complications:            No immediate complications. Estimated blood loss:                            None. Estimated Blood Loss:     Estimated blood loss: none. Procedure:                Pre-Anesthesia Assessment:                           - Prior to the procedure, a History and Physical                            was performed, and patient medications and                            allergies were reviewed. The patient's tolerance of                            previous anesthesia was also reviewed. The risks                            and benefits of the procedure and the sedation                            options and risks were discussed with the patient.                            All questions were answered, and informed consent                            was obtained. Prior Anticoagulants: The patient has                            taken no previous anticoagulant or antiplatelet                            agents. ASA Grade Assessment: III - A patient with  severe systemic disease. After reviewing the risks                            and benefits, the patient was deemed in                            satisfactory condition to undergo the procedure.                           After obtaining informed consent, the colonoscope                            was passed under direct vision. Throughout the                            procedure, the patient's blood  pressure, pulse, and                            oxygen saturations were monitored continuously. The                            CF-HQ190L ([id number] Olympus colonoscope was                            introduced through the anus and advanced to the the                            cecum, identified by appendiceal orifice and                            ileocecal valve. The colonoscopy was performed                            without difficulty. The patient tolerated the                            procedure well. The quality of the bowel                            preparation was excellent. The ileocecal valve,                            appendiceal orifice, and rectum were photographed. Scope In: 7:28:19 AM Scope Out: 7:42:23 AM Scope Withdrawal Time: 0 hours 9 minutes 53 seconds  Total Procedure Duration: 0 hours 14 minutes 4 seconds  Findings:      A 3 mm polyp was found in the ascending colon. The polyp was sessile.       The polyp was removed with a cold snare. Resection and retrieval were       complete.      External and internal hemorrhoids were found. The hemorrhoids were large.      The exam was otherwise without abnormality on direct and retroflexion       views. Impression:               -  One 3 mm polyp in the ascending colon, removed                            with a cold snare. Resected and retrieved.                           - External and internal hemorrhoids.                           - The examination was otherwise normal on direct                            and retroflexion views. Moderate Sedation:      Not Applicable - Patient had care per Anesthesia. Recommendation:           - Patient has a contact number available for                            emergencies. The signs and symptoms of potential                            delayed complications were discussed with the                            patient. Return to normal activities tomorrow.                             Written discharge instructions were provided to the                            patient.                           - Resume previous diet.                           - Continue present medications.                           - consider in office hemorrhoidal banding for your                            internal hemorrhoids.                           You will receive a letter within 2-3 weeks with the                            pathology results and my final recommendations.                           If the polyp(s) is proven to be 'pre-cancerous' on                            pathology, you will need repeat colonoscopy in 7  years. If the polyp(s) is NOT 'precancerous' on                            pathology then you should repeat colon cancer                            screening in 10 years with colonoscopy without need                            for colon cancer screening by any method prior to                            then (including stool testing). Procedure Code(s):        --- Professional ---                           [phone], Colonoscopy, flexible; with removal of                            tumor(s), polyp(s), or other lesion(s) by snare                            technique Diagnosis Code(s):        --- Professional ---                           D12.2, Benign neoplasm of ascending colon                           K64.8, Other hemorrhoids                           K62.5, Hemorrhage of anus and rectum CPT copyright 2018 American Medical Association. All rights reserved. The codes documented in this report are preliminary and upon coder review may  be revised to meet current compliance requirements. Milus Banister, MD 01/21/2019 8:00:45 AM This report has been signed electronically. Number of Addenda: 0

## 2019-01-21 NOTE — Discharge Instructions (Signed)
YOU HAD AN ENDOSCOPIC PROCEDURE TODAY: Refer to the procedure report and other information in the discharge instructions given to you for any specific questions about what was found during the examination. If this information does not answer your questions, please call [place] office at [phone] to clarify.  ° °YOU SHOULD EXPECT: Some feelings of bloating in the abdomen. Passage of more gas than usual. Walking can help get rid of the air that was put into your GI tract during the procedure and reduce the bloating. If you had a lower endoscopy (such as a colonoscopy or flexible sigmoidoscopy) you may notice spotting of blood in your stool or on the toilet paper. Some abdominal soreness may be present for a day or two, also. ° °DIET: Your first meal following the procedure should be a light meal and then it is ok to progress to your normal diet. A half-sandwich or bowl of soup is an example of a good first meal. Heavy or fried foods are harder to digest and may make you feel nauseous or bloated. Drink plenty of fluids but you should avoid alcoholic beverages for 24 hours. If you had a esophageal dilation, please see attached instructions for diet.   ° °ACTIVITY: Your care partner should take you home directly after the procedure. You should plan to take it easy, moving slowly for the rest of the day. You can resume normal activity the day after the procedure however YOU SHOULD NOT DRIVE, use power tools, machinery or perform tasks that involve climbing or major physical exertion for 24 hours (because of the sedation medicines used during the test).  ° °SYMPTOMS TO REPORT IMMEDIATELY: °A gastroenterologist can be reached at any hour. Please call [phone]  for any of the following symptoms:  °Following lower endoscopy (colonoscopy, flexible sigmoidoscopy) °Excessive amounts of blood in the stool  °Significant tenderness, worsening of abdominal pains  °Swelling of the abdomen that is new, acute  °Fever of 100° or  higher  °Following upper endoscopy (EGD, EUS, ERCP, esophageal dilation) °Vomiting of blood or coffee ground material  °New, significant abdominal pain  °New, significant chest pain or pain under the shoulder blades  °Painful or persistently difficult swallowing  °New shortness of breath  °Black, tarry-looking or red, bloody stools ° °FOLLOW UP:  °If any biopsies were taken you will be contacted by phone or by letter within the next 1-3 weeks. Call [phone]  if you have not heard about the biopsies in 3 weeks.  °Please also call with any specific questions about appointments or follow up tests. ° °

## 2019-01-21 NOTE — Anesthesia Procedure Notes (Signed)
Procedure Name: MAC Date/Time: 01/21/2019 7:21 AM Performed by: Niel Hummer, CRNA Pre-anesthesia Checklist: Patient identified, Emergency Drugs available, Suction available and Patient being monitored Patient Re-evaluated:Patient Re-evaluated prior to induction Oxygen Delivery Method: Simple face mask

## 2019-01-22 ENCOUNTER — Encounter (HOSPITAL_COMMUNITY): Payer: Self-pay | Admitting: Gastroenterology

## 2019-01-29 ENCOUNTER — Telehealth: Payer: Self-pay | Admitting: *Deleted

## 2019-01-29 ENCOUNTER — Ambulatory Visit (INDEPENDENT_AMBULATORY_CARE_PROVIDER_SITE_OTHER): Payer: Medicare HMO | Admitting: Orthopaedic Surgery

## 2019-01-29 DIAGNOSIS — G4733 Obstructive sleep apnea (adult) (pediatric): Secondary | ICD-10-CM | POA: Diagnosis not present

## 2019-01-29 NOTE — Telephone Encounter (Signed)
Called patient to let them know due to recent [place] and Health Department Protocols, we are not seeing patients in the office on Tuesday in [place]. Patient is agreeable to WebEx and will download on Android phone this weekend.

## 2019-02-01 ENCOUNTER — Telehealth (INDEPENDENT_AMBULATORY_CARE_PROVIDER_SITE_OTHER): Payer: Self-pay

## 2019-02-01 NOTE — Telephone Encounter (Signed)
Called patient and asked the screening questions.  Do you have now or have you had in the past 7 days a fever and/or chills? NO  Do you have now or have you had in the past 7 days a cough? NO  Do you have now or have you had in the last 7 days nausea, vomiting or abdominal pain? NO  Have you been exposed to anyone who has tested positive for COVID-19? NO  Have you or anyone who lives with you traveled within the last month? NO 

## 2019-02-02 ENCOUNTER — Encounter (INDEPENDENT_AMBULATORY_CARE_PROVIDER_SITE_OTHER): Payer: Self-pay | Admitting: Orthopaedic Surgery

## 2019-02-02 ENCOUNTER — Ambulatory Visit (INDEPENDENT_AMBULATORY_CARE_PROVIDER_SITE_OTHER): Payer: Medicare HMO | Admitting: Orthopaedic Surgery

## 2019-02-02 ENCOUNTER — Other Ambulatory Visit: Payer: Self-pay

## 2019-02-02 ENCOUNTER — Encounter (INDEPENDENT_AMBULATORY_CARE_PROVIDER_SITE_OTHER): Payer: Medicare HMO | Admitting: Cardiology

## 2019-02-02 DIAGNOSIS — M25522 Pain in left elbow: Secondary | ICD-10-CM | POA: Diagnosis not present

## 2019-02-02 DIAGNOSIS — M25511 Pain in right shoulder: Secondary | ICD-10-CM

## 2019-02-02 MED ORDER — METHYLPREDNISOLONE ACETATE 40 MG/ML IJ SUSP
40.0000 mg | INTRAMUSCULAR | Status: AC | PRN
  Administered 2019-02-02: 40 mg via INTRA_ARTICULAR

## 2019-02-02 MED ORDER — LIDOCAINE HCL 1 % IJ SOLN
1.0000 mL | INTRAMUSCULAR | Status: AC | PRN
  Administered 2019-02-02: 1 mL

## 2019-02-02 MED ORDER — LIDOCAINE HCL 1 % IJ SOLN
3.0000 mL | INTRAMUSCULAR | Status: AC | PRN
  Administered 2019-02-02: 3 mL

## 2019-02-02 MED ORDER — BUPIVACAINE HCL 0.5 % IJ SOLN
1.0000 mL | INTRAMUSCULAR | Status: AC | PRN
  Administered 2019-02-02: 1 mL via INTRA_ARTICULAR

## 2019-02-02 MED ORDER — BUPIVACAINE HCL 0.5 % IJ SOLN
3.0000 mL | INTRAMUSCULAR | Status: AC | PRN
  Administered 2019-02-02: 3 mL via INTRA_ARTICULAR

## 2019-02-02 NOTE — Progress Notes (Signed)
Office Visit Note   Patient: Bruce Robertson           Date of Birth: 08-Apr-1961           MRN: [id number] Visit Date: 02/02/2019              Requested by: Caren Macadam, MD [place], [place] [id number] PCP: Caren Macadam, MD   Assessment & Plan: Visit Diagnoses:  1. Pain in left elbow   2. Acute pain of right shoulder     Plan: Impression is right shoulder impingement and rotator cuff syndrome and left elbow medial epicondylitis.  We had a lengthy discussion today regarding treatment options and we decided to proceed with cortisone injections for both body parts.  We perform subacromial injection as well as an injection into the left elbow medial epicondyle and the common flexor tendon.  Patient tolerates well.  He will continue to rest as long as he needs to.  Questions encouraged and answered.  Follow-up as needed.  He understands that nonemergent MRIs are not being scheduled at this time.  Follow-Up Instructions: Return if symptoms worsen or fail to improve.   Orders:  No orders of the defined types were placed in this encounter.  No orders of the defined types were placed in this encounter.     Procedures: Large Joint Inj: R subacromial bursa on 02/02/2019 9:41 AM Indications: pain Details: 22 G needle  Arthrogram: No  Medications: 3 mL lidocaine 1 %; 3 mL bupivacaine 0.5 %; 40 mg methylPREDNISolone acetate 40 MG/ML Outcome: tolerated well, no immediate complications Consent was given by the patient. Patient was prepped and draped in the usual sterile fashion.   Medium Joint Inj: L elbow on 02/02/2019 9:41 AM Details: 22 G needle Medications: 1 mL lidocaine 1 %; 40 mg methylPREDNISolone acetate 40 MG/ML; 1 mL bupivacaine 0.5 % Outcome: tolerated well, no immediate complications      Clinical Data: No additional findings.   Subjective: Chief Complaint  Patient presents with  . Left Elbow - Pain    Bruce Robertson is a  58 year old gentleman who follows up today for his right shoulder pain and left elbow pain.  He has suspected right shoulder impingement as well as AC joint arthrosis and left elbow medial epicondylitis.  He states that the elbow is causing him severe pain.  He is very active with weightlifting and exercising.  The [facility] joint injection gave him really good relief for about 10 days.  He still has pain when he lifts his right arm above his shoulder.   Review of Systems   Objective: Vital Signs: There were no vitals taken for this visit.  Physical Exam  Ortho Exam Right shoulder exam shows good strength with manual muscle testing.  He does have mild to moderate pain with empty can testing.  Range of motion is normal.  Left elbow exam shows significant tenderness of the cubital tunnel and medial epicondyle and the common flexor tendon.  He has no ulnar nerve symptoms.  Ulnar nerve is stable to elbow range of motion. Specialty Comments:  No specialty comments available.  Imaging: No results found.   PMFS History: Patient Active Problem List   Diagnosis Date Noted  . Prediabetes 06/10/2018  . Thrombocytopenia ([place]) 06/10/2018  . AICD (automatic cardioverter/defibrillator) present 06/10/2018  . History of peptic ulcer disease 06/10/2018  . Non-ischemic cardiomyopathy ([place])   . NSVT (nonsustained ventricular tachycardia) ([place])   . Hypotension due to  drugs 02/09/2018  . Polyp of colon 04/29/2016  . Dyspnea on exertion 03/05/2016  . Essential hypertension 10/31/2015  . Acute on chronic systolic heart failure ([place]) 08/29/2015  . Hx of CABG 08/29/2015  . Stage 3 chronic kidney disease ([place]) 08/29/2015  . OSA (obstructive sleep apnea) 06/10/2011  . Fatigue 06/10/2011  . Obesity, unspecified 05/02/2010  . Hemorrhoids 02/08/2010  . ESOPHAGEAL REFLUX 02/08/2010  . Rectal bleeding 02/08/2010  . Hyperlipidemia 04/06/2009  . TOBACCO USER 04/06/2009  . Cardiomyopathy, dilated, nonischemic  ([place]) 04/06/2009   Past Medical History:  Diagnosis Date  . AICD (automatic cardioverter/defibrillator) present   . Anxiety   . Cardiomyopathy    Nonischemic. EF has been about 45%. S/P CABG.;  b.  Echo 4/14: EF 25%, global HK with inf and mid apical AK, restrictive physiology with E/e' > 15 (elevated LV filling pressure), trivial AI/MR, mild to mod LAE, mild RVE, mild reduced RVSF, mild RAE, mod TR, PASP 74 (severe pulmonary HTN)   . CHF (congestive heart failure) ([place])   . CHF exacerbation ([place]) 07/31/2016  . Colon polyps 2002  . Coronary artery disease   . Depression   . Dyslipidemia    hx (03/20/2017)  . GERD (gastroesophageal reflux disease)   . History of bleeding peptic ulcer   . History of echocardiogram    Echo 10/16:  EF 25-30%, poss non-compaction, diff HK with inf-lat and apical HK, restrictive physio, mild AI, severe LAE, mild RVE with mild reduced RVSF, PASP 42 mmHg  . Hyperplastic rectal polyp   . OSA on CPAP    Mild  . Tobacco abuse    Remote    Family History  Problem Relation Age of Onset  . Hypertension Mother   . Osteoarthritis Mother   . Heart failure Father 32  . High blood pressure Father   . Lung cancer Other   . Cancer Maternal Grandmother        uncertain type  . Bipolar disorder Daughter   . Diabetes Daughter   . Cancer Maternal Uncle        uncertain type  . Diabetes Niece     Past Surgical History:  Procedure Laterality Date  . CARDIAC CATHETERIZATION  2007;  2009  . CARDIAC DEFIBRILLATOR PLACEMENT  03/20/2017  . COLONOSCOPY WITH PROPOFOL N/A 01/21/2019   Procedure: COLONOSCOPY WITH PROPOFOL;  Surgeon: Milus Banister, MD;  Location: [facility];  Service: Endoscopy;  Laterality: N/A;  . CORONARY ARTERY BYPASS GRAFT  2007   Had a left main dissection after catheterization. Underwent a saphenous vein graft to the LAD and a saphenous vein graft to obtuse marginal.  . ICD IMPLANT N/A 03/20/2017   Procedure: ICD Implant;  Surgeon: Constance Haw, MD;  Location: [facility];  Service: Cardiovascular;  Laterality: N/A;  . PATELLAR TENDON REPAIR Right 2005  . POLYPECTOMY  01/21/2019   Procedure: POLYPECTOMY;  Surgeon: Milus Banister, MD;  Location: [facility];  Service: Endoscopy;;   Social History   Occupational History  . Occupation: Civil engineer, contracting    Employer: [employer]  Tobacco Use  . Smoking status: Former Smoker    Packs/day: 0.10    Years: 7.00    Pack years: 0.70    Types: Cigarettes  . Smokeless tobacco: Never Used  . Tobacco comment: 03/20/2017 "quit before 2000"  Substance and Sexual Activity  . Alcohol use: Yes    Alcohol/week: 0.0 standard drinks    Comment: 03/20/2017 "might have 6  drinks/year @ the most"  . Drug use: Yes    Types: Marijuana    Comment: 03/20/2017 "joint qod"  . Sexual activity: Not Currently

## 2019-02-02 NOTE — Progress Notes (Signed)
error 

## 2019-02-03 NOTE — Progress Notes (Signed)
This encounter was created in error - please disregard.

## 2019-03-01 ENCOUNTER — Telehealth: Payer: Self-pay | Admitting: *Deleted

## 2019-03-01 DIAGNOSIS — G4733 Obstructive sleep apnea (adult) (pediatric): Secondary | ICD-10-CM | POA: Diagnosis not present

## 2019-03-01 NOTE — Telephone Encounter (Signed)
Please schedule web visit to review sugars. Make sure he is in contact with ortho regarding his continued elbow/shoulder pain.

## 2019-03-01 NOTE — Telephone Encounter (Signed)
Copied from [place] [id number]. Topic: General - Inquiry >> Mar 01, 2019  2:03 PM Richardo Priest, NT wrote: Patient called in stating procedure that was done for left elbow and right shoulder did not improve anything. In fact elbow is worst. Patient also wants to make MD aware that his sugars have been running from 140-200s. Call back number is [phone] to discuss next step of care.

## 2019-03-02 NOTE — Telephone Encounter (Signed)
I called the pt and scheduled a virtual visit for an appt on 4/29 at 8:30am.

## 2019-03-02 NOTE — Telephone Encounter (Signed)
Noted  

## 2019-03-03 ENCOUNTER — Other Ambulatory Visit: Payer: Self-pay

## 2019-03-03 ENCOUNTER — Telehealth: Payer: Self-pay | Admitting: *Deleted

## 2019-03-03 ENCOUNTER — Encounter: Payer: Self-pay | Admitting: Family Medicine

## 2019-03-03 ENCOUNTER — Ambulatory Visit (INDEPENDENT_AMBULATORY_CARE_PROVIDER_SITE_OTHER): Payer: Medicare HMO | Admitting: Family Medicine

## 2019-03-03 DIAGNOSIS — M25522 Pain in left elbow: Secondary | ICD-10-CM

## 2019-03-03 DIAGNOSIS — M25511 Pain in right shoulder: Secondary | ICD-10-CM

## 2019-03-03 DIAGNOSIS — R7301 Impaired fasting glucose: Secondary | ICD-10-CM

## 2019-03-03 MED ORDER — BLOOD GLUCOSE MONITOR KIT: PACK | 0 refills | Status: DC

## 2019-03-03 NOTE — Telephone Encounter (Signed)
I called the pt and scheduled a lab appt for 4/30 at [facility] was reactivated, patient was given the password noted in the chart and advised to call [facility] support [phone].  Patient is aware the diabetic diet info was left at the front desk for pick up.

## 2019-03-03 NOTE — Telephone Encounter (Signed)
-----   Message from Caren Macadam, MD sent at 03/03/2019  8:39 AM EDT ----- Please call patient to schedule bloodwork. He also needs email for mychart sign up ([email or web address]). And I have diabetic diet info I would like put up front for him to pick up when he checks in for bloodwork.

## 2019-03-03 NOTE — Progress Notes (Signed)
Virtual Visit via Video Note  I connected with@ on 03/03/19 at  8:30 AM EDT by a video enabled telemedicine application and verified that I am speaking with the correct person using two identifiers.  Location patient: home Location provider:work or home office Persons participating in the virtual visit: patient, provider  I discussed the limitations of evaluation and management by telemedicine and the availability of in person appointments. The patient expressed understanding and agreed to proceed.   Bruce Robertson DOB: [birth year] Encounter date: 03/03/2019  This is a 58 y.o. male who presents with No chief complaint on file.   History of present illness: Provider requested web visit to discuss patients concern of elevated blood sugars as well as concerns for lack of improvement in shoulder and elbow pain. He was seen 02/02/19 by Dr. Erlinda Hong and did have both subacromial bursa and elbow injections at that time. He has had prednisone bursts in the last couple of months. Typically not on medications for blood sugars. A1C has been borderline in past with last one 11/25/18 being 6.3.   HPI  States that after elbow injection he had increased pain. Pain is getting more severe. Shoulder is still hurting, ROM is ok but if picking up anything he has more pain. More pain at night.   Has been having episodes of body shaking. Normally happens at night about 12-1am. If up and moving around he is fine; once he lays down then he starts trembling. Blood sugar this morning was 120. Night where he was trembling sugar was 201. Had been 2-3 hours since he had eaten. That is first time he has seen it that high. Usually high will be 140-150. Last spike was this last Friday. States he did have chocolate craving and ate a lot of chocolate that day.The trembling episodes happened a few times, but since he stopped eating carbs on Friday he hasn't that this episode.   Generally is a healthy eater. Not always a breakfast  eater. Sometimes sandwich. Usually does not eat lunch. Larger dinner - chicken, fish, hamburger, veggies. Mostly chicken/fish. Drinks mostly water. Occasional sprite. Occasional juice, rare alcohol. Does a lot of rice, potatoes. Dinner includes multiple starches, carbs, fruits, veggies, meat.    No Known Allergies Current Meds  Medication Sig  . aspirin EC 81 MG tablet Take 81 mg by mouth daily.  Marland Kitchen BLACK CURRANT SEED OIL PO Take 1 capsule by mouth daily.  . carvedilol (COREG) 6.25 MG tablet Take 1 tablet (6.25 mg total) by mouth 2 (two) times daily with a meal.  . furosemide (LASIX) 40 MG tablet Take 40 mg by mouth See admin instructions. Take 1 tablet (40 mg) by mouth scheduled in the morning, may repeat dose in the evening if needed for fluid build up  . Menthol, Topical Analgesic, (ABSORBINE JR BACK PATCH EX) Place 1 patch onto the skin daily as needed (pain.).  Marland Kitchen Multiple Vitamin (MULTIVITAMIN WITH MINERALS) TABS tablet Take 1 tablet by mouth daily.  . polyethylene glycol-electrolytes (NULYTELY/GOLYTELY) 420 g solution Take 4,000 mLs by mouth as directed.  . predniSONE (DELTASONE) 20 MG tablet Take 3 tablets daily x 3 days, 2 tablets daily x 3 days, 1 tablet daily x 3 days, 1/2 tablet daily x 2 days.  . simvastatin (ZOCOR) 20 MG tablet TAKE 1 TABLET BY MOUTH AT BEDTIME (Patient taking differently: Take 20 mg by mouth at bedtime. )  . spironolactone (ALDACTONE) 25 MG tablet TAKE 1/2 (ONE-HALF) TABLET BY MOUTH ONCE DAILY (Patient taking  differently: Take 12.5 mg by mouth daily. TAKE 1/2 (ONE-HALF) TABLET BY MOUTH ONCE DAILY)  . traMADol (ULTRAM) 50 MG tablet Take 1 tablet (50 mg total) by mouth 2 (two) times daily as needed for severe pain.  Marland Kitchen zinc gluconate 50 MG tablet Take 50 mg by mouth daily.   Current Facility-Administered Medications for the 03/03/19 encounter (Office Visit) with Caren Macadam, MD  Medication  . methylPREDNISolone acetate (DEPO-MEDROL) injection 40 mg    Review  of Systems  Constitutional: Negative for activity change, appetite change, chills and fever.  Endocrine: Positive for polyphagia.  Musculoskeletal: Positive for arthralgias. Negative for back pain.  Neurological: Negative for dizziness and light-headedness. Tremors: shaky with high blood sugar.    Objective:  There were no vitals taken for this visit.      BP Readings from Last 3 Encounters:  01/21/19 108/84  12/23/18 (!) 100/54  12/22/18 100/64   Wt Readings from Last 3 Encounters:  01/21/19 224 lb (101.6 kg)  12/23/18 231 lb 3.2 oz (104.9 kg)  12/22/18 234 lb 4 oz (106.3 kg)    EXAM:  GENERAL: alert, oriented, appears well and in no acute distress  HEENT: atraumatic, conjunctiva clear, no obvious abnormalities on inspection of external nose and ears  NECK: normal movements of the head and neck  LUNGS: on inspection no signs of respiratory distress, breathing rate appears normal, no obvious gross SOB, gasping or wheezing  CV: no obvious cyanosis  MS: moves all visible extremities without noticeable abnormality  PSYCH/NEURO: pleasant and cooperative, no obvious depression or anxiety, speech and thought processing grossly intact  Assessment/Plan  1. Elevated fasting blood sugar He has maintained borderline diabetes for some time. He has in recent months had multiple steroid injections as well as steroid bursts which likely contributed to elevated sugars. We reviewed his eating habits which include significantly high amount of carb/starches. He is going to work on cutting back on carb servings which we discussed in detail. Additionally will put diabetic diet/carb serving info up front for him to review. Suggested he limit carbs, but also spread out healthy carbs (ie instead of fruit with dinner have fruit w lunch/breakfast along with protein). Suggested he continue to check blood sugar three times daily and then will report back to me with these numbers once I get lab results.  Call sooner if concerns with sugar (high or low) - Hemoglobin A1c; Future - Microalbumin / creatinine urine ratio; Future - Basic metabolic panel; Future - blood glucose meter kit and supplies KIT; Dispense based on patient and insurance preference. Use up to four times daily as directed.  Dispense: 1 each; Refill: 0  2. Right shoulder pain, unspecified chronicity Call ortho for follow up (number given). He is aware of limitations right now with evaluation (MRI limited).   3. Left elbow pain See above     I discussed the assessment and treatment plan with the patient. The patient was provided an opportunity to ask questions and all were answered. The patient agreed with the plan and demonstrated an understanding of the instructions.   The patient was advised to call back or seek an in-person evaluation if the symptoms worsen or if the condition fails to improve as anticipated.  I provided 22 minutes of non-face-to-face time during this encounter.   Micheline Rough, MD

## 2019-03-04 ENCOUNTER — Telehealth: Payer: Self-pay | Admitting: Family Medicine

## 2019-03-04 ENCOUNTER — Other Ambulatory Visit (INDEPENDENT_AMBULATORY_CARE_PROVIDER_SITE_OTHER): Payer: Medicare HMO

## 2019-03-04 ENCOUNTER — Other Ambulatory Visit: Payer: Self-pay

## 2019-03-04 DIAGNOSIS — R7301 Impaired fasting glucose: Secondary | ICD-10-CM | POA: Diagnosis not present

## 2019-03-04 LAB — BASIC METABOLIC PANEL
BUN: 21 mg/dL (ref 6–23)
CO2: 30 mEq/L (ref 19–32)
Calcium: 9.4 mg/dL (ref 8.4–10.5)
Chloride: 104 mEq/L (ref 96–112)
Creatinine, Ser: 1.57 mg/dL — ABNORMAL HIGH (ref 0.40–1.50)
GFR: 55.54 mL/min — ABNORMAL LOW (ref >60.00)
Glucose, Bld: 97 mg/dL (ref 70–99)
Potassium: 4 mEq/L (ref 3.5–5.1)
Sodium: 140 mEq/L (ref 135–145)

## 2019-03-04 LAB — MICROALBUMIN / CREATININE URINE RATIO
Creatinine,U: 281.2 mg/dL
Microalb Creat Ratio: 1.2 mg/g (ref 0.0–30.0)
Microalb, Ur: 3.3 mg/dL — ABNORMAL HIGH (ref 0.0–1.9)

## 2019-03-04 LAB — HEMOGLOBIN A1C: Hgb A1c MFr Bld: 6.8 % — ABNORMAL HIGH (ref 4.6–6.5)

## 2019-03-04 NOTE — Telephone Encounter (Signed)
Copied from [place] [id number]. Topic: Quick Communication - Rx Refill/Question >> Mar 04, 2019  1:10 PM Scherrie Gerlach wrote: Medication: blood glucose meter kit and supplies KIT Pt states the pharmacy does not have rx for this new machine/kit.  Dr Ethlyn Gallery prescribed yesterday Pt has never had before, and dr has instructed him to test TID. Can you resend asap?  Pt wants to pick up today please.  [facility] ([address] [place] [phone] (Phone) [phone] (Fax)

## 2019-03-04 NOTE — Telephone Encounter (Signed)
Rx was not sent electronically and I found this on the printer.  Rx faxed to [facility] at [phone] and I called the pt and informed him of this .

## 2019-03-05 ENCOUNTER — Ambulatory Visit (INDEPENDENT_AMBULATORY_CARE_PROVIDER_SITE_OTHER): Payer: Medicare HMO | Admitting: Orthopaedic Surgery

## 2019-03-05 ENCOUNTER — Other Ambulatory Visit (INDEPENDENT_AMBULATORY_CARE_PROVIDER_SITE_OTHER): Payer: Self-pay

## 2019-03-05 DIAGNOSIS — M25522 Pain in left elbow: Secondary | ICD-10-CM

## 2019-03-05 DIAGNOSIS — M25511 Pain in right shoulder: Secondary | ICD-10-CM | POA: Diagnosis not present

## 2019-03-05 DIAGNOSIS — G8929 Other chronic pain: Secondary | ICD-10-CM

## 2019-03-05 NOTE — Progress Notes (Signed)
Office Visit Note   Patient: Bruce Robertson           Date of Birth: 05-25-1961           MRN: [id number] Visit Date: 03/05/2019              Requested by: Caren Macadam, MD [place],  [id number] PCP: Caren Macadam, MD   Assessment & Plan: Visit Diagnoses:  1. Pain in left elbow   2. Acute pain of right shoulder     Plan: Impression right shoulder rotator cuff tendinosis and left elbow medial epicondylitis.  He has failed conservative treatment up to this point and now I recommend MRI of both right shoulder and left elbow in order to assess for structural abnormalities.  Follow-Up Instructions: Return if symptoms worsen or fail to improve.   Orders:  No orders of the defined types were placed in this encounter.  No orders of the defined types were placed in this encounter.     Procedures: No procedures performed   Clinical Data: No additional findings.   Subjective: Chief Complaint  Patient presents with  . Right Shoulder - Pain  . Left Elbow - Pain    [name] returns today for follow up for his right shoulder pain and left elbow pain.  He states his right shoulder is feeling slightly better since the subacromial injection but his left elbow is feeling worse.  He cannot lift anything more than 5 lbs.  Again, he denies any numbness or tingling.     Review of Systems  Constitutional: Negative.   All other systems reviewed and are negative.    Objective: Vital Signs: There were no vitals taken for this visit.  Physical Exam Vitals signs and nursing note reviewed.  Constitutional:      Appearance: He is well-developed.  Pulmonary:     Effort: Pulmonary effort is normal.  Abdominal:     Palpations: Abdomen is soft.  Skin:    General: Skin is warm.  Neurological:     Mental Status: He is alert and oriented to person, place, and time.  Psychiatric:        Behavior: Behavior normal.        Thought Content:  Thought content normal.        Judgment: Judgment normal.     Ortho Exam Right shoulder - mild impingement - positive empty can  Left elbow - marked ttp to left medial epicondyle - no paresthesias in ulnar nerve distribution  Specialty Comments:  No specialty comments available.  Imaging: No results found.   PMFS History: Patient Active Problem List   Diagnosis Date Noted  . Prediabetes 06/10/2018  . Thrombocytopenia ([place]) 06/10/2018  . AICD (automatic cardioverter/defibrillator) present 06/10/2018  . History of peptic ulcer disease 06/10/2018  . Non-ischemic cardiomyopathy ([place])   . NSVT (nonsustained ventricular tachycardia) ([place])   . Hypotension due to drugs 02/09/2018  . Polyp of colon 04/29/2016  . Dyspnea on exertion 03/05/2016  . Essential hypertension 10/31/2015  . Acute on chronic systolic heart failure ([place]) 08/29/2015  . Hx of CABG 08/29/2015  . Stage 3 chronic kidney disease ([place]) 08/29/2015  . OSA (obstructive sleep apnea) 06/10/2011  . Fatigue 06/10/2011  . Obesity, unspecified 05/02/2010  . Hemorrhoids 02/08/2010  . ESOPHAGEAL REFLUX 02/08/2010  . Rectal bleeding 02/08/2010  . Hyperlipidemia 04/06/2009  . TOBACCO USER 04/06/2009  . Cardiomyopathy, dilated, nonischemic ([place]) 04/06/2009   Past Medical History:  Diagnosis  Date  . AICD (automatic cardioverter/defibrillator) present   . Anxiety   . Cardiomyopathy    Nonischemic. EF has been about 45%. S/P CABG.;  b.  Echo 4/14: EF 25%, global HK with inf and mid apical AK, restrictive physiology with E/e' > 15 (elevated LV filling pressure), trivial AI/MR, mild to mod LAE, mild RVE, mild reduced RVSF, mild RAE, mod TR, PASP 74 (severe pulmonary HTN)   . CHF (congestive heart failure) ([place])   . CHF exacerbation ([place]) 07/31/2016  . Colon polyps 2002  . Coronary artery disease   . Depression   . Dyslipidemia    hx (03/20/2017)  . GERD (gastroesophageal reflux disease)   . History of bleeding peptic  ulcer   . History of echocardiogram    Echo 10/16:  EF 25-30%, poss non-compaction, diff HK with inf-lat and apical HK, restrictive physio, mild AI, severe LAE, mild RVE with mild reduced RVSF, PASP 42 mmHg  . Hyperplastic rectal polyp   . OSA on CPAP    Mild  . Tobacco abuse    Remote    Family History  Problem Relation Age of Onset  . Hypertension Mother   . Osteoarthritis Mother   . Heart failure Father 37  . High blood pressure Father   . Lung cancer Other   . Cancer Maternal Grandmother        uncertain type  . Bipolar disorder Daughter   . Diabetes Daughter   . Cancer Maternal Uncle        uncertain type  . Diabetes Niece     Past Surgical History:  Procedure Laterality Date  . CARDIAC CATHETERIZATION  2007;  2009  . CARDIAC DEFIBRILLATOR PLACEMENT  03/20/2017  . COLONOSCOPY WITH PROPOFOL N/A 01/21/2019   Procedure: COLONOSCOPY WITH PROPOFOL;  Surgeon: Milus Banister, MD;  Location: [facility];  Service: Endoscopy;  Laterality: N/A;  . CORONARY ARTERY BYPASS GRAFT  2007   Had a left main dissection after catheterization. Underwent a saphenous vein graft to the LAD and a saphenous vein graft to obtuse marginal.  . ICD IMPLANT N/A 03/20/2017   Procedure: ICD Implant;  Surgeon: Constance Haw, MD;  Location: [facility];  Service: Cardiovascular;  Laterality: N/A;  . PATELLAR TENDON REPAIR Right 2005  . POLYPECTOMY  01/21/2019   Procedure: POLYPECTOMY;  Surgeon: Milus Banister, MD;  Location: [facility];  Service: Endoscopy;;   Social History   Occupational History  . Occupation: Civil engineer, contracting    Employer: [employer]  Tobacco Use  . Smoking status: Former Smoker    Packs/day: 0.10    Years: 7.00    Pack years: 0.70    Types: Cigarettes  . Smokeless tobacco: Never Used  . Tobacco comment: 03/20/2017 "quit before 2000"  Substance and Sexual Activity  . Alcohol use: Yes    Alcohol/week: 0.0 standard drinks    Comment: 03/20/2017 "might have  6 drinks/year @ the most"  . Drug use: Yes    Types: Marijuana    Comment: 03/20/2017 "joint qod"  . Sexual activity: Not Currently

## 2019-03-18 ENCOUNTER — Telehealth: Payer: Self-pay | Admitting: *Deleted

## 2019-03-18 NOTE — Telephone Encounter (Signed)
Is this ok?

## 2019-03-18 NOTE — Telephone Encounter (Signed)
CT arthrogram

## 2019-03-18 NOTE — Telephone Encounter (Signed)
Pt called stating imaging called him to schedule found out he has Defibilator so they can not do MRI, states he would have to do CT instead.   ? Ok to change to CT's  Please advise

## 2019-03-18 NOTE — Telephone Encounter (Signed)
yes

## 2019-03-23 ENCOUNTER — Other Ambulatory Visit: Payer: Self-pay

## 2019-03-23 ENCOUNTER — Ambulatory Visit (INDEPENDENT_AMBULATORY_CARE_PROVIDER_SITE_OTHER): Payer: Medicare HMO | Admitting: *Deleted

## 2019-03-23 DIAGNOSIS — I428 Other cardiomyopathies: Secondary | ICD-10-CM

## 2019-03-23 LAB — CUP PACEART REMOTE DEVICE CHECK
Battery Remaining Longevity: 123 mo
Battery Voltage: 3.01 V
Brady Statistic RV Percent Paced: 0.44 %
Date Time Interrogation Session: 20200519083724
HighPow Impedance: 58 Ohm
Implantable Lead Implant Date: 20180517
Implantable Lead Location: 753860
Implantable Pulse Generator Implant Date: 20180517
Lead Channel Impedance Value: 342 Ohm
Lead Channel Impedance Value: 399 Ohm
Lead Channel Pacing Threshold Amplitude: 0.625 V
Lead Channel Pacing Threshold Pulse Width: 0.4 ms
Lead Channel Sensing Intrinsic Amplitude: 9.5 mV
Lead Channel Sensing Intrinsic Amplitude: 9.5 mV
Lead Channel Setting Pacing Amplitude: 2.5 V
Lead Channel Setting Pacing Pulse Width: 0.4 ms
Lead Channel Setting Sensing Sensitivity: 0.3 mV

## 2019-03-25 ENCOUNTER — Telehealth: Payer: Self-pay | Admitting: Cardiology

## 2019-03-25 ENCOUNTER — Other Ambulatory Visit: Payer: Self-pay | Admitting: Orthopaedic Surgery

## 2019-03-25 DIAGNOSIS — G8929 Other chronic pain: Secondary | ICD-10-CM

## 2019-03-25 NOTE — Telephone Encounter (Signed)
[place], smartphone, consent, pre reg complete 03/25/19 AF

## 2019-03-25 NOTE — Addendum Note (Signed)
Addended by: Daylene Posey T on: 03/25/2019 03:39 PM   Modules accepted: Orders

## 2019-03-25 NOTE — Telephone Encounter (Signed)
New order placed

## 2019-03-29 NOTE — Progress Notes (Signed)
Virtual Visit via Video Note   This visit type was conducted due to national recommendations for restrictions regarding the COVID-19 Pandemic (e.g. social distancing) in an effort to limit this patient's exposure and mitigate transmission in our community.  Due to his co-morbid illnesses, this patient is at least at moderate risk for complications without adequate follow up.  This format is felt to be most appropriate for this patient at this time.  All issues noted in this document were discussed and addressed.  A limited physical exam was performed with this format.  Please refer to the patient's chart for his consent to telehealth for [facility].   Date:  03/30/2019   ID:  Bruce Robertson, DOB 02-13-1961, MRN [id number]  Patient Location: Home Provider Location: Home  PCP:  Caren Macadam, MD  Cardiologist:  Minus Breeding, MD  Electrophysiologist:  Will [name], MD   Evaluation Performed:  Follow-Up Visit  Chief Complaint:  Dyspnea   History of Present Illness:    Bruce Robertson is a 58 y.o. male of his cardiomyopathy. Years ago he had an ejection fraction of 40%. 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 He did end up requiring bypass because during his diagnostic catheterization he had dissection of his left main vessel. His ejection fraction eventually improved to about 45% with medications. The etiology was thought probably to be hypertensive.  Subsequent echocardiogram demonstrates his EF now to be about 25%.  I repeated an echo recently and the EF was still 20 - 25%.  He has been followed in the HF clinic. Meds have been titrated slightly but his blood pressure precludes significant movement.   He is status post ICD implant.  He had follow up with Dr. Claiborne Billings and management of his CPAP.    He continues to have some dyspnea with exertion.  This seems to be a chronic problem.  He says that he will get short of breath sleeping his porch.  However, he was able to go up a  flight of stairs and come back on get his blood pressure cuff during this visit and was not noticeably short of breath.  He is having trouble with his CPAP but he is not describing PND or orthopnea.  He is not having any chest pressure, neck or arm discomfort.  His weights seem to be stable though he is not weighing himself daily.  He is not edema by his report.  He says he is watching his salt intake.  He occasionally takes 2 or 3 of his 40 mg Lasix in a day.  The patient does not have symptoms concerning for COVID-19 infection (fever, chills, cough, or new shortness of breath).    Past Medical History:  Diagnosis Date  . AICD (automatic cardioverter/defibrillator) present   . Anxiety   . Cardiomyopathy    Nonischemic. EF has been about 45%. S/P CABG.;  b.  Echo 4/14: EF 25%, global HK with inf and mid apical AK, restrictive physiology with E/e' > 15 (elevated LV filling pressure), trivial AI/MR, mild to mod LAE, mild RVE, mild reduced RVSF, mild RAE, mod TR, PASP 74 (severe pulmonary HTN)   . CHF (congestive heart failure) ([place])   . CHF exacerbation ([place]) 07/31/2016  . Colon polyps 2002  . Coronary artery disease   . Depression   . Dyslipidemia    hx (03/20/2017)  . GERD (gastroesophageal reflux disease)   . History of bleeding peptic ulcer   . History of echocardiogram  Echo 10/16:  EF 25-30%, poss non-compaction, diff HK with inf-lat and apical HK, restrictive physio, mild AI, severe LAE, mild RVE with mild reduced RVSF, PASP 42 mmHg  . Hyperplastic rectal polyp   . OSA on CPAP    Mild  . Tobacco abuse    Remote   Past Surgical History:  Procedure Laterality Date  . CARDIAC CATHETERIZATION  2007;  2009  . CARDIAC DEFIBRILLATOR PLACEMENT  03/20/2017  . COLONOSCOPY WITH PROPOFOL N/A 01/21/2019   Procedure: COLONOSCOPY WITH PROPOFOL;  Surgeon: Milus Banister, MD;  Location: [facility];  Service: Endoscopy;  Laterality: N/A;  . CORONARY ARTERY BYPASS GRAFT  2007   Had a left  main dissection after catheterization. Underwent a saphenous vein graft to the LAD and a saphenous vein graft to obtuse marginal.  . ICD IMPLANT N/A 03/20/2017   Procedure: ICD Implant;  Surgeon: Constance Haw, MD;  Location: [place] [facility];  Service: Cardiovascular;  Laterality: N/A;  . PATELLAR TENDON REPAIR Right 2005  . POLYPECTOMY  01/21/2019   Procedure: POLYPECTOMY;  Surgeon: Milus Banister, MD;  Location: [facility];  Service: Endoscopy;;     Prior to Admission medications   Medication Sig Start Date End Date Taking? Authorizing Provider  aspirin EC 81 MG tablet Take 81 mg by mouth daily.   Yes [provider]  BLACK CURRANT SEED OIL PO Take 1 capsule by mouth daily.   Yes [provider]  blood glucose meter kit and supplies KIT Dispense based on patient and insurance preference. Use up to four times daily as directed. 03/03/19  Yes Koberlein, Steele Berg, MD  carvedilol (COREG) 6.25 MG tablet Take 1 tablet (6.25 mg total) by mouth 2 (two) times daily with a meal. 10/21/18  Yes Andree Heeg, Jeneen Rinks, MD  furosemide (LASIX) 40 MG tablet Take 40 mg by mouth See admin instructions. Take 1 tablet (40 mg) by mouth scheduled in the morning, may repeat dose in the evening if needed for fluid build up   Yes [provider]  Menthol, Topical Analgesic, (ABSORBINE JR BACK PATCH EX) Place 1 patch onto the skin daily as needed (pain.).   Yes [provider]  Multiple Vitamin (MULTIVITAMIN WITH MINERALS) TABS tablet Take 1 tablet by mouth daily.   Yes [provider]  simvastatin (ZOCOR) 20 MG tablet TAKE 1 TABLET BY MOUTH AT BEDTIME Patient taking differently: Take 20 mg by mouth at bedtime.  09/16/18  Yes Minus Breeding, MD  spironolactone (ALDACTONE) 25 MG tablet TAKE 1/2 (ONE-HALF) TABLET BY MOUTH ONCE DAILY Patient taking differently: Take 12.5 mg by mouth daily. TAKE 1/2 (ONE-HALF) TABLET BY MOUTH ONCE DAILY 12/10/18  Yes Minus Breeding, MD  zinc  gluconate 50 MG tablet Take 50 mg by mouth daily.   Yes [provider]  polyethylene glycol-electrolytes (NULYTELY/GOLYTELY) 420 g solution Take 4,000 mLs by mouth as directed. 12/22/18   Milus Banister, MD  predniSONE (DELTASONE) 20 MG tablet Take 3 tablets daily x 3 days, 2 tablets daily x 3 days, 1 tablet daily x 3 days, 1/2 tablet daily x 2 days. Patient not taking: Reported on 03/30/2019 12/23/18   Caren Macadam, MD  traMADol (ULTRAM) 50 MG tablet Take 1 tablet (50 mg total) by mouth 2 (two) times daily as needed for severe pain. Patient not taking: Reported on 03/30/2019 01/12/19   Caren Macadam, MD      Allergies:   Patient has no known allergies.   Social  History   Tobacco Use  . Smoking status: Former Smoker    Packs/day: 0.10    Years: 7.00    Pack years: 0.70    Types: Cigarettes  . Smokeless tobacco: Never Used  . Tobacco comment: 03/20/2017 "quit before 2000"  Substance Use Topics  . Alcohol use: Yes    Alcohol/week: 0.0 standard drinks    Comment: 03/20/2017 "might have 6 drinks/year @ the most"  . Drug use: Yes    Types: Marijuana    Comment: 03/20/2017 "joint qod"     Family Hx: The patient's family history includes Bipolar disorder in his daughter; Cancer in his maternal grandmother and maternal uncle; Diabetes in his daughter and niece; Heart failure (age of onset: 9) in his father; High blood pressure in his father; Hypertension in his mother; Lung cancer in an other family member; Osteoarthritis in his mother.  ROS:   Please see the history of present illness.    As stated in the HPI and negative for all other systems.   Prior CV studies:   The following studies were reviewed today: None  Labs/Other Tests and Data Reviewed:    EKG:  No ECG reviewed.  Recent Labs: 06/09/2018: B Natriuretic Peptide 764.0 11/25/2018: ALT 34; Hemoglobin 13.7; Platelets 142.0; Pro B Natriuretic peptide (BNP) 431.0; TSH 2.17 03/04/2019: BUN 21; Creatinine,  Ser 1.57; Potassium 4.0; Sodium 140   Recent Lipid Panel Lab Results  Component Value Date/Time   CHOL 175 11/25/2018 04:40 PM   TRIG 186.0 (H) 11/25/2018 04:40 PM   HDL 45.20 11/25/2018 04:40 PM   CHOLHDL 4 11/25/2018 04:40 PM   LDLCALC 93 11/25/2018 04:40 PM   LDLDIRECT 166.4 05/17/2010 09:05 AM    Wt Readings from Last 3 Encounters:  03/30/19 215 lb (97.5 kg)  01/21/19 224 lb (101.6 kg)  12/23/18 231 lb 3.2 oz (104.9 kg)     Objective:    Vital Signs:  Ht 6' 1"  (1.854 m)   Wt 215 lb (97.5 kg)   BMI 28.37 kg/m    VITAL SIGNS:  reviewed GEN:  no acute distress EYES:  sclerae anicteric, EOMI - Extraocular Movements Intact NEURO:  alert and oriented x 3, no obvious focal deficit PSYCH:  normal affect  ASSESSMENT & PLAN:    CARDIOMYOPATHY:      He has not tolerated medications in the past.  I tried to titrate his beta-blocker.  I tried to titrate his ACE inhibitor.  He has not tolerated this in times he has been somewhat noncompliant.  At this point I am going to change his Lasix to torsemide.   I will consider further evaluation with possibly PYP study in the future.     SLEEP APNEA:   I sent a message to our sleep nurse to contact him.   ICD:  He had a normal remote transmission earlier this month.    COVID-19 Education: The signs and symptoms of COVID-19 were discussed with the patient and how to seek care for testing (follow up with PCP or arrange E-visit).  The importance of social distancing was discussed today.  Time:   Today, I have spent 20 minutes with the patient with telehealth technology discussing the above problems.     Medication Adjustments/Labs and Tests Ordered: Current medicines are reviewed at length with the patient today.  Concerns regarding medicines are outlined above.   Tests Ordered: No orders of the defined types were placed in this encounter.   Medication Changes: No orders of  the defined types were placed in this encounter.    Disposition:  Follow up in 2 weeks with a BMET and will see Jory Sims DNP in the office at the time of the lab draw  Signed, Minus Breeding, MD  03/30/2019 3:19 PM    [place]

## 2019-03-30 ENCOUNTER — Encounter: Payer: Self-pay | Admitting: Cardiology

## 2019-03-30 ENCOUNTER — Telehealth (INDEPENDENT_AMBULATORY_CARE_PROVIDER_SITE_OTHER): Payer: Medicare HMO | Admitting: Cardiology

## 2019-03-30 VITALS — BP 124/72 | HR 77 | Ht 73.0 in | Wt 215.0 lb

## 2019-03-30 DIAGNOSIS — Z79899 Other long term (current) drug therapy: Secondary | ICD-10-CM | POA: Diagnosis not present

## 2019-03-30 MED ORDER — TORSEMIDE 20 MG PO TABS: 40.0000 mg | ORAL_TABLET | Freq: Every day | ORAL | 11 refills | Status: DC

## 2019-03-30 NOTE — Patient Instructions (Addendum)
Medication Instructions:  STOP- Furosemide  START- Torsemide 20 mg take 40 mg(2 tablets) daily  If you need a refill on your cardiac medications before your next appointment, please call your pharmacy.  Labwork: BMP in 2 weeks HERE IN OUR OFFICE AT [facility] You will NOT need to fast    Take the provided lab slips with you to the lab for your blood draw.   When you have your labs (blood work) drawn today and your tests are completely normal, you will receive your results only by MyChart Message (if you have MyChart) -OR-  A paper copy in the mail.  If you have any lab test that is abnormal or we need to change your treatment, we will call you to review these results.  Testing/Procedures: None Ordered   Follow-Up: . Your physician recommends that you schedule a follow-up appointment in: 2 Weeks with Jory Sims June 9th @ 10:00 am .   At [facility], you and your health needs are our priority.  As part of our continuing mission to provide you with exceptional heart care, we have created designated Provider Care Teams.  These Care Teams include your primary Cardiologist (physician) and Advanced Practice Providers (APPs -  Physician Assistants and Nurse Practitioners) who all work together to provide you with the care you need, when you need it.  Thank you for choosing [facility]!!

## 2019-03-31 DIAGNOSIS — G4733 Obstructive sleep apnea (adult) (pediatric): Secondary | ICD-10-CM | POA: Diagnosis not present

## 2019-04-01 ENCOUNTER — Encounter: Payer: Self-pay | Admitting: Cardiology

## 2019-04-01 NOTE — Progress Notes (Signed)
Remote ICD transmission.   

## 2019-04-12 NOTE — Progress Notes (Signed)
Cardiology Office Note   Date:  04/13/2019   ID:  Bruce Robertson, DOB 03/10/1961, MRN [id number]  PCP:  Caren Macadam, MD  Cardiologist:  Minus Breeding, MD EP: Will [name], MD  Chief Complaint  Patient presents with  . Shortness of Breath      History of Present Illness: Bruce Robertson is a 58 y.o. male with a PMH of non-ischemic cardiomyopathy (EF initially 40%, improved to 45%, then dropped to 20-25%), s/p CABG in 2007 due to diagnostic cath complicated by LM dissection, s/p ICD for primary prevention, HLD, GERD, depression, and OSA who presents for follow-up of his CHF.  He was last evaluated by cardiology via a telemedicine visit with Dr. Percival Robertson 03/30/2019, at which time he was experiencing DOE which was chronic, for which he reported taking 2-3 105m tablets of lasix a day. He was transitioned to torsemide but was limited in titration of other HF medications given prior intolerance. Per Dr. HPercival Robertson could consider PYP study in the future to further evaluate cardiomyopathy. His last echocardiogram was 03/2018 which showed a severely dilated LV with EF 20-25%, G2DD, diffuse hypokinesis, mild AI, moderate TR, and mildly increased PA pressures. His last ischemic evaluation was a stress myoview which did not reveal any change from previous and was a low risk study. Recent PPM interrogation 03/23/2019 with EF with normal function and possible <10 minute episode of atrial fibrillation.  He returns today for follow-up of recent medication changes. He reports improvement in his DOE since changing from lasix to torsemide. He states he has lost almost 15lbs since COVID-19 stay at home order which he attributes to muscle mass loss as he has not been able to go to the gym. He states he is still having difficulty with his BiPAP machine but has not received a call to schedule an appointment with Dr. KClaiborne Billings. No complaints of LE edema, orthopnea, PND, palpitations, syncope, or chest  pain. He reports having shoulder pain, for which he was recommended to undergo an MRI to further evaluate, however this has not been done yet as he wasn't sure whether his ICD was MRI compatible.   The patient does not have symptoms concerning for COVID-19 infection (fever, chills, cough, or new shortness of breath).     Past Medical History:  Diagnosis Date  . AICD (automatic cardioverter/defibrillator) present   . Anxiety   . Cardiomyopathy    Nonischemic. EF has been about 45%. S/P CABG.;  b.  Echo 4/14: EF 25%, global HK with inf and mid apical AK, restrictive physiology with E/e' > 15 (elevated LV filling pressure), trivial AI/MR, mild to mod LAE, mild RVE, mild reduced RVSF, mild RAE, mod TR, PASP 74 (severe pulmonary HTN)   . CHF [place]ive heart [place]) [place]   . [place]  . Colon polyps 2002  . Coronary artery disease   . Depression   . Dyslipidemia    hx (03/20/2017)  . GERD (gastroesophageal reflux disease)   . History of bleeding peptic ulcer   . History of echocardiogram    Echo 10/16:  EF 25-30%, poss non-compaction, diff HK with inf-lat and apical HK, restrictive physio, mild AI, severe LAE, mild RVE with mild reduced RVSF, PASP 42 mmHg  . Hyperplastic rectal polyp   . OSA on CPAP    Mild  . Tobacco abuse    Remote    Past Surgical History:  Procedure Laterality Date  . CARDIAC CATHETERIZATION  2007;  2009  .  CARDIAC DEFIBRILLATOR PLACEMENT  03/20/2017  . COLONOSCOPY WITH PROPOFOL N/A 01/21/2019   Procedure: COLONOSCOPY WITH PROPOFOL;  Surgeon: Milus Banister, MD;  Location: [facility];  Service: Endoscopy;  Laterality: N/A;  . CORONARY ARTERY BYPASS GRAFT  2007   Had a left main dissection after catheterization. Underwent a saphenous vein graft to the LAD and a saphenous vein graft to obtuse marginal.  . ICD IMPLANT N/A 03/20/2017   Procedure: ICD Implant;  Surgeon: Constance Haw, MD;  Location: [place] [facility];  Service:  Cardiovascular;  Laterality: N/A;  . PATELLAR TENDON REPAIR Right 2005  . POLYPECTOMY  01/21/2019   Procedure: POLYPECTOMY;  Surgeon: Milus Banister, MD;  Location: [facility];  Service: Endoscopy;;     Current Outpatient Medications  Medication Sig Dispense Refill  . aspirin EC 81 MG tablet Take 81 mg by mouth daily.    Marland Kitchen BLACK CURRANT SEED OIL PO Take 1 capsule by mouth daily.    . blood glucose meter kit and supplies KIT Dispense based on patient and insurance preference. Use up to four times daily as directed. 1 each 0  . carvedilol (COREG) 6.25 MG tablet Take 1 tablet (6.25 mg total) by mouth 2 (two) times daily with a meal. 60 tablet 6  . Menthol, Topical Analgesic, (ABSORBINE JR BACK PATCH EX) Place 1 patch onto the skin daily as needed (pain.).    Marland Kitchen Multiple Vitamin (MULTIVITAMIN WITH MINERALS) TABS tablet Take 1 tablet by mouth daily.    . simvastatin (ZOCOR) 20 MG tablet TAKE 1 TABLET BY MOUTH AT BEDTIME 30 tablet 10  . spironolactone (ALDACTONE) 25 MG tablet TAKE 1/2 (ONE-HALF) TABLET BY MOUTH ONCE DAILY 45 tablet 3  . torsemide (DEMADEX) 20 MG tablet Take 2 tablets (40 mg total) by mouth daily. 60 tablet 11  . zinc gluconate 50 MG tablet Take 50 mg by mouth daily.     Current Facility-Administered Medications  Medication Dose Route Frequency Provider Last Rate Last Dose  . methylPREDNISolone acetate (DEPO-MEDROL) injection 40 mg  40 mg Intra-articular Once Hilts, Michael, MD        Allergies:   Patient has no known allergies.    Social History:  The patient  reports that he has quit smoking. His smoking use included cigarettes. He has a 0.70 pack-year smoking history. He has never used smokeless tobacco. He reports current alcohol use. He reports current drug use. Drug: Marijuana.   Family History:  The patient's family history includes Bipolar disorder in his daughter; Cancer in his maternal grandmother and maternal uncle; Diabetes in his daughter and niece; Heart  failure (age of onset: 65) in his father; High blood pressure in his father; Hypertension in his mother; Lung cancer in an other family member; Osteoarthritis in his mother.    ROS:  Please see the history of present illness.   Otherwise, review of systems are positive for none.   All other systems are reviewed and negative.    PHYSICAL EXAM: VS:  BP 97/63   Pulse (!) 58   Temp (!) 97.5 F (36.4 C)   Ht 6' (1.829 m)   Wt 218 lb 12.8 oz (99.2 kg)   SpO2 99%   BMI 29.67 kg/m  , BMI Body mass index is 29.67 kg/m. GEN: Well nourished, well developed, in no acute distress HEENT: sclera anicteric Neck: no JVD, carotid bruits, or masses Cardiac: RRR; no murmurs, rubs, or gallops, no edema  Respiratory:  clear to auscultation bilaterally,  normal work of breathing GI: soft, nontender, nondistended, + BS MS: no deformity or atrophy Skin: warm and dry, no rash Neuro:  Strength and sensation are intact Psych: euthymic mood, full affect   EKG:  EKG is not ordered today.    Recent Labs: 06/09/2018: B Natriuretic Peptide 764.0 11/25/2018: ALT 34; Hemoglobin 13.7; Platelets 142.0; Pro B Natriuretic peptide (BNP) 431.0; TSH 2.17 03/04/2019: BUN 21; Creatinine, Ser 1.57; Potassium 4.0; Sodium 140    Lipid Panel    Component Value Date/Time   CHOL 175 11/25/2018 1640   TRIG 186.0 (H) 11/25/2018 1640   HDL 45.20 11/25/2018 1640   CHOLHDL 4 11/25/2018 1640   VLDL 37.2 11/25/2018 1640   LDLCALC 93 11/25/2018 1640   LDLDIRECT 166.4 05/17/2010 0905      Wt Readings from Last 3 Encounters:  04/13/19 218 lb 12.8 oz (99.2 kg)  03/30/19 215 lb (97.5 kg)  01/21/19 224 lb (101.6 kg)      Other studies Reviewed: Additional studies/ records that were reviewed today include:   Echocardiogram 03/2018: Study Conclusions  - Left ventricle: The cavity size was severely dilated. Systolic   function was severely reduced. The estimated ejection fraction   was in the range of 20% to 25%.  Diffuse hypokinesis. Features are   consistent with a pseudonormal left ventricular filling pattern,   with concomitant abnormal relaxation and increased filling   pressure (grade 2 diastolic dysfunction). Doppler parameters are   consistent with high ventricular filling pressure. - Aortic valve: There was mild regurgitation. - Mitral valve: Transvalvular velocity was within the normal range.   There was no evidence for stenosis. There was trivial   regurgitation. - Left atrium: The atrium was mildly dilated. - Right ventricle: The cavity size was normal. Wall thickness was   normal. Systolic function was mildly reduced. - Atrial septum: No defect or patent foramen ovale was identified   by color flow Doppler. - Tricuspid valve: There was moderate regurgitation. - Pulmonary arteries: Systolic pressure was mildly increased. PA   peak pressure: 44 mm Hg (S).    ASSESSMENT AND PLAN:   1. DOE in patient with non-ischemic cardiomyopathy: he reports improvement in DOE since transitioning from lasix to torsemide. He appears euvolemic on exam today - Continue torsemide 42m daily - Continue carvedilol  - Continue spironolactone - Unable to add ACEi/ARB/Entresto due to soft Bps - Continue to monitor daily weights and limit sodium intake. - Will check a BMET today for close monitoring of kidney function.   2. S/p ICD: primary prevention given persistently low EF. Last interrogation 03/23/2019 with possible isolated brief episode of atrial fibrillation. Next interrogation scheduled 06/2019. If AF noted more frequently will need to have a discussion regarding anticoagulation.  - Continue routine monitoring per Dr. CCurt Bears- ICD is compatible with MRI    3. OSA: reports having problems with his Bipap mask which has limited compliance. He was awaiting a call to schedule an appt with Dr. KClaiborne Billings Suspect unmanaged OSA could be contributing to DOE.  - Will attempt to get a visit with Dr. KClaiborne BillingsASAP  for OSA management.   4. S/p CABG: no significant native CAD, unfortunately CABG performed emergently after R/LHC to evaluate cardiomyopathy was complicated by LM dissection. No anginal complaints - Continue aspirin and statin  5. HLD: LDL 93 11/2018 - Continue statin - Continue to encourage dietary and lifestyle modifications to lower cholesterol.   6. Pre-DM: Hgb A1C 6.8 02/2019. He has been making dietary  modifications in an effort to avoid medications.  - Continue to encourage dietary and lifestyle modifications to improve glucose control.   7. CKD stage 3: Cr 1.5 02/2019 - Will repeat BMET today for close monitoring of kidney function.     Current medicines are reviewed at length with the patient today.  The patient does not have concerns regarding medicines.  The following changes have been made:  no change  Labs/ tests ordered today include:   Orders Placed This Encounter  Procedures  . Basic metabolic panel     Disposition:   FU with Dr. Percival Robertson in 3 months  Signed, Abigail Butts, PA-C  04/13/2019 12:07 PM

## 2019-04-13 ENCOUNTER — Other Ambulatory Visit: Payer: Self-pay

## 2019-04-13 ENCOUNTER — Ambulatory Visit (INDEPENDENT_AMBULATORY_CARE_PROVIDER_SITE_OTHER): Payer: Medicare HMO | Admitting: Medical

## 2019-04-13 ENCOUNTER — Encounter: Payer: Self-pay | Admitting: Medical

## 2019-04-13 VITALS — BP 97/63 | HR 58 | Temp 97.5°F | Ht 72.0 in | Wt 218.8 lb

## 2019-04-13 DIAGNOSIS — Z79899 Other long term (current) drug therapy: Secondary | ICD-10-CM

## 2019-04-13 LAB — BASIC METABOLIC PANEL
BUN/Creatinine Ratio: 13 (ref 9–20)
BUN: 19 mg/dL (ref 6–24)
CO2: 26 mmol/L (ref 20–29)
Calcium: 10 mg/dL (ref 8.7–10.2)
Chloride: 100 mmol/L (ref 96–106)
Creatinine, Ser: 1.41 mg/dL — ABNORMAL HIGH (ref 0.76–1.27)
GFR calc Af Amer: 64 mL/min/{1.73_m2} (ref >59)
GFR calc non Af Amer: 55 mL/min/{1.73_m2} — ABNORMAL LOW (ref >59)
Glucose: 97 mg/dL (ref 65–99)
Potassium: 4.4 mmol/L (ref 3.5–5.2)
Sodium: 139 mmol/L (ref 134–144)

## 2019-04-13 NOTE — Patient Instructions (Signed)
Medication Instructions:  NO CHANGES- Your physician recommends that you continue on your current medications as directed. Please refer to the Current Medication list given to you today.  If you need a refill on your cardiac medications before your next appointment, please call your pharmacy.  Labwork: BMET TODAY HERE IN OUR OFFICE AT [facility]    Take the provided lab slips with you to the lab for your blood draw.   When you have your labs (blood work) drawn today and your tests are completely normal, you will receive your results only by MyChart Message (if you have MyChart) -OR-  A paper copy in the mail.  If you have any lab test that is abnormal or we need to change your treatment, we will call you to review these results.  Special Instructions: PLEASE FOLLOW LOW SODIUM DIET ATTACHED  Follow-Up: You will need a follow up appointment in 3 months.  Please call our office 2 months in advance to schedule this appointment.  You may see Bruce Breeding, MD, Roby Lofts, PA-C or one of the following Advanced Practice Providers on your designated Care Team:  Rosaria Ferries, PA-C  Jory Sims, DNP, ANP     You will need a follow up appointment [facility], you and your health needs are our priority.  As part of our continuing mission to provide you with exceptional heart care, we have created designated Provider Care Teams.  These Care Teams include your primary Cardiologist (physician) and Advanced Practice Providers (APPs -  Physician Assistants and Nurse Practitioners) who all work together to provide you with the care you need, when you need it.  Thank you for choosing [facility]!!      Low-Sodium Eating Plan Sodium, which is an element that makes up salt, helps you maintain a healthy balance of fluids in your body. Too much sodium can increase your blood pressure and cause fluid and waste to be  held in your body. Your health care provider or dietitian may recommend following this plan if you have high blood pressure (hypertension), kidney disease, liver disease, or heart failure. Eating less sodium can help lower your blood pressure, reduce swelling, and protect your heart, liver, and kidneys. What are tips for following this plan? General guidelines  Most people on this plan should limit their sodium intake to 1,500-2,000 mg (milligrams) of sodium each day. Reading food labels   The Nutrition Facts label lists the amount of sodium in one serving of the food. If you eat more than one serving, you must multiply the listed amount of sodium by the number of servings.  Choose foods with less than 140 mg of sodium per serving.  Avoid foods with 300 mg of sodium or more per serving. Shopping  Look for lower-sodium products, often labeled as "low-sodium" or "no salt added."  Always check the sodium content even if foods are labeled as "unsalted" or "no salt added".  Buy fresh foods. ? Avoid canned foods and premade or frozen meals. ? Avoid canned, cured, or processed meats  Buy breads that have less than 80 mg of sodium per slice. Cooking  Eat more home-cooked food and less restaurant, buffet, and fast food.  Avoid adding salt when cooking. Use salt-free seasonings or herbs instead of table salt or sea salt. Check with your health care provider or pharmacist before using salt substitutes.  Cook with plant-based oils, such as  canola, sunflower, or olive oil. Meal planning  When eating at a restaurant, ask that your food be prepared with less salt or no salt, if possible.  Avoid foods that contain MSG (monosodium glutamate). MSG is sometimes added to [place] food, bouillon, and some canned foods. What foods are recommended? The items listed may not be a complete list. Talk with your dietitian about what dietary choices are best for you. Grains Low-sodium cereals, including  oats, puffed wheat and rice, and shredded wheat. Low-sodium crackers. Unsalted rice. Unsalted pasta. Low-sodium bread. Whole-grain breads and whole-grain pasta. Vegetables Fresh or frozen vegetables. "No salt added" canned vegetables. "No salt added" tomato sauce and paste. Low-sodium or reduced-sodium tomato and vegetable juice. Fruits Fresh, frozen, or canned fruit. Fruit juice. Meats and other protein foods Fresh or frozen (no salt added) meat, poultry, seafood, and fish. Low-sodium canned tuna and salmon. Unsalted nuts. Dried peas, beans, and lentils without added salt. Unsalted canned beans. Eggs. Unsalted nut butters. Dairy Milk. Soy milk. Cheese that is naturally low in sodium, such as ricotta cheese, fresh mozzarella, or Swiss cheese Low-sodium or reduced-sodium cheese. Cream cheese. Yogurt. Fats and oils Unsalted butter. Unsalted margarine with no trans fat. Vegetable oils such as canola or olive oils. Seasonings and other foods Fresh and dried herbs and spices. Salt-free seasonings. Low-sodium mustard and ketchup. Sodium-free salad dressing. Sodium-free light mayonnaise. Fresh or refrigerated horseradish. Lemon juice. Vinegar. Homemade, reduced-sodium, or low-sodium soups. Unsalted popcorn and pretzels. Low-salt or salt-free chips. What foods are not recommended? The items listed may not be a complete list. Talk with your dietitian about what dietary choices are best for you. Grains Instant hot cereals. Bread stuffing, pancake, and biscuit mixes. Croutons. Seasoned rice or pasta mixes. Noodle soup cups. Boxed or frozen macaroni and cheese. Regular salted crackers. Self-rising flour. Vegetables Sauerkraut, pickled vegetables, and relishes. Olives. [place] fries. Onion rings. Regular canned vegetables (not low-sodium or reduced-sodium). Regular canned tomato sauce and paste (not low-sodium or reduced-sodium). Regular tomato and vegetable juice (not low-sodium or reduced-sodium). Frozen  vegetables in sauces. Meats and other protein foods Meat or fish that is salted, canned, smoked, spiced, or pickled. Bacon, ham, sausage, hotdogs, corned beef, chipped beef, packaged lunch meats, salt pork, jerky, pickled herring, anchovies, regular canned tuna, sardines, salted nuts. Dairy Processed cheese and cheese spreads. Cheese curds. Blue cheese. Feta cheese. String cheese. Regular cottage cheese. Buttermilk. Canned milk. Fats and oils Salted butter. Regular margarine. Ghee. Bacon fat. Seasonings and other foods Onion salt, garlic salt, seasoned salt, table salt, and sea salt. Canned and packaged gravies. Worcestershire sauce. Tartar sauce. Barbecue sauce. Teriyaki sauce. Soy sauce, including reduced-sodium. Steak sauce. Fish sauce. Oyster sauce. Cocktail sauce. Horseradish that you find on the shelf. Regular ketchup and mustard. Meat flavorings and tenderizers. Bouillon cubes. Hot sauce and Tabasco sauce. Premade or packaged marinades. Premade or packaged taco seasonings. Relishes. Regular salad dressings. Salsa. Potato and tortilla chips. Corn chips and puffs. Salted popcorn and pretzels. Canned or dried soups. Pizza. Frozen entrees and pot pies. Summary  Eating less sodium can help lower your blood pressure, reduce swelling, and protect your heart, liver, and kidneys.  Most people on this plan should limit their sodium intake to 1,500-2,000 mg (milligrams) of sodium each day.  Canned, boxed, and frozen foods are high in sodium. Restaurant foods, fast foods, and pizza are also very high in sodium. You also get sodium by adding salt to food.  Try to cook at home, eat more fresh fruits  and vegetables, and eat less fast food, canned, processed, or prepared foods. This information is not intended to replace advice given to you by your health care provider. Make sure you discuss any questions you have with your health care provider. Document Released: 04/12/2002 Document Revised: 10/14/2016  Document Reviewed: 10/14/2016 Elsevier Interactive Patient Education  2019 [employer].

## 2019-04-19 ENCOUNTER — Telehealth: Payer: Medicare HMO | Admitting: Adult Health

## 2019-04-20 ENCOUNTER — Ambulatory Visit (INDEPENDENT_AMBULATORY_CARE_PROVIDER_SITE_OTHER): Payer: Medicare HMO | Admitting: Cardiovascular Disease

## 2019-04-20 ENCOUNTER — Other Ambulatory Visit: Payer: Self-pay

## 2019-04-20 ENCOUNTER — Encounter: Payer: Self-pay | Admitting: Cardiovascular Disease

## 2019-04-20 VITALS — BP 98/78 | HR 54 | Temp 96.8°F | Ht 72.0 in | Wt 217.4 lb

## 2019-04-20 DIAGNOSIS — I420 Dilated cardiomyopathy: Secondary | ICD-10-CM | POA: Diagnosis not present

## 2019-04-20 DIAGNOSIS — I5022 Chronic systolic (congestive) heart failure: Secondary | ICD-10-CM

## 2019-04-20 DIAGNOSIS — I428 Other cardiomyopathies: Secondary | ICD-10-CM | POA: Diagnosis not present

## 2019-04-20 DIAGNOSIS — G4731 Primary central sleep apnea: Secondary | ICD-10-CM

## 2019-04-20 DIAGNOSIS — Z95810 Presence of automatic (implantable) cardiac defibrillator: Secondary | ICD-10-CM | POA: Diagnosis not present

## 2019-04-20 MED ORDER — CARVEDILOL 6.25 MG PO TABS: 6.2500 mg | ORAL_TABLET | Freq: Two times a day (BID) | ORAL | 6 refills | Status: DC

## 2019-04-20 NOTE — Progress Notes (Signed)
Bruce Robertson   [birth year]  [id number]  Primary Physician No PCP Per Patient Primary Cardiologist: Dr [name]; Charlcie Cradle)  HPI: Bruce Robertson is a 58 y.o. male who presents for f/u  sleep evaluation.  I had seen him in September 2017 for initial evaluation following institution of CPAP therapy.  He was referred back to sleep clinic per Dr. [name] , and I saw him on 01/29/2018.  He completed a BiPAP titration.  I last saw him in May 2019.  He presents for follow-up evaluation.  Bruce Robertson has a history of a nonischemic cardiomyopathy.  He had issues with significant daytime sleepiness, fatigability, and snoring, and was referred for a sleep study which on 03/10/2016 in a split-night protocol.  This confirmed moderate obstructive sleep apnea overall with an AHI of 26.7.  However, the events were severe supine position with an AHI of 43.3.   Oxygen desaturated  to a nadir of 88%.  There was evidence for soft snoring.  He initially was titrated up to 9 cm water pressure.  His set up date was 05/06/2016.  A download from 06/03/2016 through 07/02/2016 demonstrated compliance with reference to days of usage being 77%.  However, he was noncompliant with usage greater than 4 hours, such that he had only 43% of days reaching this threshold.  His average usage on days used was 4 hours and 24 minutes.  His AHI was 6.3. When I saw him for initial evaluation he was still sleepy and at times did not feel as if he was getting adequate air.  With his positional component with severe sleep apnea I changed him to an auto modewith supine sleep which would allow for increased pressures with supine posture adjustment  When I initially saw him he still had residual daytime sleepiness as assessed by his Epworth sleepiness scale score of 11, which is outlined below.   Epworth Sleepiness Scale: Situation   Chance of Dozing/Sleeping (0 = never , 1 = slight chance , 2 = moderate chance , 3 = high chance )   sitting and reading 2   watching TV 2   sitting inactive in a public place 1   being a passenger in a motor vehicle for an hour or more 2   lying down in the afternoon 3   sitting and talking to someone 0   sitting quietly after lunch (no alcohol) 1   while stopped for a few minutes in traffic as the driver 0   Total Score  11   He  developed worsening heart function and EF had dropped to 20 to 25%.  His medications were titrated and he underwent ICD implantation.  Apparently, he is only been intermittently using CPAP.  In August 2019 he was referred back to sleep clinic for further evaluation.  An Epworth Sleepiness Scale score was calculated which endorsed at 16, as shown below.   Epworth Sleepiness Scale: Situation   Chance of Dozing/Sleeping (0 = never , 1 = slight chance , 2 = moderate chance , 3 = high chance )   sitting and reading 3   watching TV 3   sitting inactive in a public place 2   being a passenger in a motor vehicle for an hour or more 3   lying down in the afternoon 3   sitting and talking to someone 0   sitting quietly after lunch (no alcohol) 1   while stopped for a few minutes in traffic as the driver  1   Total Score  16   A new download was obtained in the office  from December 29, 2017 through January 27, 2018.  He was noncompliant.  Usage days was only 16 out of 30 (53%) and usage greater than 4 hours was only 8 days (27%).  AHI is was 23 and is consistent with moderate sleep apnea.  Apnea index was 21.5 with an hypopnea index of 1.5.  Central apnea index was 10.9/h. When I saw him on 01/29/2018 for evaluation, had a long discussion with him regarding the importance of therapy.  I was concerned that with his reduced LV function.  If his volume status is elevated, he may be developing central apneic events.  In addition to obstructive events.  He underwent a BiPAP titration study on 03/10/2018.  Sleep efficiency was 79.7%.  This was a suboptimal titration.  BiPAP was  implemented at 8 over 4, and he was titrated up to 17/13.  At low level pressures, he had rare central events, but as bilevel pressure increased above 12/8, there was significant increase in the number of central apneic events.  His AHI at 10/6 was 0 with an RDI of 10.7, but he only had 11 minutes of sleep.  At 12/8.  AHI was 7.7, RDI 23.2.  He did not have any central events and had 4 red hours.  Oxygen saturation was 93 and 94% during non-REM and REM  Sleep. AHI was significantly worse at higher BiPAP pressures.  I saw him in May 2019.  At time I reviewed his BiPAP titration.  Based on his LV function presently I cannot initiate ASV therapy which would be most likely beneficial for him.  In the Serve heart failure trial, which used an old ResMed ASV unit, there was deteriorated deleterious potential outcome.  However, this study was significantly flawed.  A new study with the Respironics ASV therapy with significant additional allergy has been underway and may be promising for ultimate changing him to ASV titration.  I had seen him, I discussed initiation of Entresto but due to his blood pressure being low is unable to initiate this therapy.  A new download was obtained from June 01, 2018 through June 30, 2018.  He currently has been set on a minimum EPAP pressure of 4 with a maximum IPAP pressure of 12 with a pressure support of 4.  AHI remains elevated at 29 with an apnea index of 28.  He also had a central apnea index of 18.2.  During this.  He had been hospitalized for several days with heart failure at the beginning of August which may have contributed to his increased central apnea due to increased volume.  During his hospitalization he lost over 92 pounds of fluid.  When I saw him during that evaluation I recommend adjustment to his BiPAP therapy.  Bruce Robertson continues to have issues with heart failure and recently his diuretic regimen was adjusted leading to significant additional diuresis and  improved breathing.  Prior to the change of his diuretic regimen, he did not feel he could use the BiPAP and his compliance was poor.  With the recent change of his diuretic regimen with torsemide 40 mg in addition to his Spironolactone 12.5 mg twice a day as well as carvedilol 6.25 mg twice a day for the last several weeks he believes he is breathing better and has again started to use his BiPAP therapy.  He believes subjectively that his sleeping is improved.  A download was obtained from Mar 20, 2019 through April 18, 2019 which encompasses the period of time where he was not compliant.  As result usage days was only 37% with usage greater than 4 hours at only 23%.  Unfortunately, it appears that the changes that were recommended last year last year may not taken.  As result he continues to be on a minimum EPAP pressure of 8 with a maximum IPAP pressure of 12 and pressure support of 4.  AHI remains elevated at 26.8 predominantly due to central events.  He typically goes to bed between 10 and 11 and wakes up between 7 or 8.  His last echo Doppler study was in May 2019 which showed an EF of 20 to 25%.  An Epworth scale was recalculated in the office today and this endorsed at 14 consistent with residual daytime sleepiness.  Past Medical History:  Diagnosis Date   AICD (automatic cardioverter/defibrillator) present    Anxiety    Cardiomyopathy    Nonischemic. EF has been about 45%. S/P CABG.;  b.  Echo 4/14: EF 25%, global HK with inf and mid apical AK, restrictive physiology with E/e' > 15 (elevated LV filling pressure), trivial AI/MR, mild to mod LAE, mild RVE, mild reduced RVSF, mild RAE, mod TR, PASP 74 (severe pulmonary HTN)    CHF (congestive heart failure) (HCC)    CHF exacerbation (HCC) 07/31/2016   Colon polyps 2002   Coronary artery disease    Depression    Dyslipidemia    hx (03/20/2017)   GERD (gastroesophageal reflux disease)    History of bleeding peptic ulcer    History of  echocardiogram    Echo 10/16:  EF 25-30%, poss non-compaction, diff HK with inf-lat and apical HK, restrictive physio, mild AI, severe LAE, mild RVE with mild reduced RVSF, PASP 42 mmHg   Hyperplastic rectal polyp    OSA on CPAP    Mild   Tobacco abuse    Remote    Past Surgical History:  Procedure Laterality Date   CARDIAC CATHETERIZATION  2007;  2009   CARDIAC DEFIBRILLATOR PLACEMENT  03/20/2017   COLONOSCOPY WITH PROPOFOL N/A 01/21/2019   Procedure: COLONOSCOPY WITH PROPOFOL;  Surgeon: Milus Banister, MD;  Location: [facility];  Service: Endoscopy;  Laterality: N/A;   CORONARY ARTERY BYPASS GRAFT  2007   Had a left main dissection after catheterization. Underwent a saphenous vein graft to the LAD and a saphenous vein graft to obtuse marginal.   ICD IMPLANT N/A 03/20/2017   Procedure: ICD Implant;  Surgeon: Constance Haw, MD;  Location: [facility];  Service: Cardiovascular;  Laterality: N/A;   PATELLAR TENDON REPAIR Right 2005   POLYPECTOMY  01/21/2019   Procedure: POLYPECTOMY;  Surgeon: Milus Banister, MD;  Location: [facility];  Service: Endoscopy;;    No Known Allergies  Current Outpatient Medications  Medication Sig Dispense Refill   aspirin EC 81 MG tablet Take 81 mg by mouth daily.     BLACK CURRANT SEED OIL PO Take 1 capsule by mouth daily.     blood glucose meter kit and supplies KIT Dispense based on patient and insurance preference. Use up to four times daily as directed. 1 each 0   carvedilol (COREG) 6.25 MG tablet Take 1 tablet (6.25 mg total) by mouth 2 (two) times daily with a meal. 60 tablet 6   Menthol, Topical Analgesic, (ABSORBINE JR BACK PATCH EX) Place 1 patch onto the skin daily as  needed (pain.).     Multiple Vitamin (MULTIVITAMIN WITH MINERALS) TABS tablet Take 1 tablet by mouth daily.     simvastatin (ZOCOR) 20 MG tablet TAKE 1 TABLET BY MOUTH AT BEDTIME 30 tablet 10   spironolactone (ALDACTONE) 25 MG tablet TAKE 1/2  (ONE-HALF) TABLET BY MOUTH ONCE DAILY 45 tablet 3   torsemide (DEMADEX) 20 MG tablet Take 2 tablets (40 mg total) by mouth daily. 60 tablet 11   zinc gluconate 50 MG tablet Take 50 mg by mouth daily.     Current Facility-Administered Medications  Medication Dose Route Frequency Provider Last Rate Last Dose   methylPREDNISolone acetate (DEPO-MEDROL) injection 40 mg  40 mg Intra-articular Once Hilts, Michael, MD        Social History   Socioeconomic History   Marital status: Divorced    Spouse name: Not on file   Number of children: 5   Years of education: Not on file   Highest education level: Not on file  Occupational History   Occupation: Civil engineer, contracting    Employer: [employer] strain: Not on file   Food insecurity    Worry: Not on file    Inability: Not on file   Transportation needs    Medical: Not on file    Non-medical: Not on file  Tobacco Use   Smoking status: Former Smoker    Packs/day: 0.10    Years: 7.00    Pack years: 0.70    Types: Cigarettes   Smokeless tobacco: Never Used   Tobacco comment: 03/20/2017 "quit before 2000"  Substance and Sexual Activity   Alcohol use: Yes    Alcohol/week: 0.0 standard drinks    Comment: 03/20/2017 "might have 6 drinks/year @ the most"   Drug use: Yes    Types: Marijuana    Comment: 03/20/2017 "joint qod"   Sexual activity: Not Currently  Lifestyle   Physical activity    Days per week: Not on file    Minutes per session: Not on file   Stress: Not on file  Relationships   Social connections    Talks on phone: Not on file    Gets together: Not on file    Attends religious service: Not on file    Active member of club or organization: Not on file    Attends meetings of clubs or organizations: Not on file    Relationship status: Not on file   Intimate partner violence    Fear of current or ex partner: Not on file    Emotionally abused: Not on file    Physically  abused: Not on file    Forced sexual activity: Not on file  Other Topics Concern   Not on file  Social History Narrative   Single    Family History  Problem Relation Age of Onset   Hypertension Mother    Osteoarthritis Mother    Heart failure Father 61   High blood pressure Father    Lung cancer Other    Cancer Maternal Grandmother        uncertain type   Bipolar disorder Daughter    Diabetes Daughter    Cancer Maternal Uncle        uncertain type   Diabetes Niece      ROS General: Negative; No fevers, chills, or night sweats HEENT: Negative; No changes in vision or hearing, sinus congestion, difficulty swallowing Pulmonary: Negative; No cough, wheezing, shortness of breath, hemoptysis Cardiovascular: Positive  for a nonischemic cardiomyopathy.  Hospitalization in early August 2019 with volume overload with aggressive diuresis of 22 pounds. GI: Negative; No nausea, vomiting, diarrhea, or abdominal pain GU: Negative; No dysuria, hematuria, or difficulty voiding Musculoskeletal: Negative; no myalgias, joint pain, or weakness Hematologic: Negative; no easy bruising, bleeding Endocrine: Negative; no heat/cold intolerance Neuro: Negative; no changes in balance, headaches Skin: Negative; No rashes or skin lesions Psychiatric: Negative; No behavioral problems, depression Sleep: Positive for sleep apnea as noted above with daytime sleepiness, hypersomnolence; no bruxism, restless legs, hypnogognic hallucinations, no cataplexy   Physical Exam BP 98/78    Pulse (!) 54    Temp (!) 96.8 F (36 C)    Ht 6' (1.829 m)    Wt 217 lb 6.4 oz (98.6 kg)    SpO2 93%    BMI 29.48 kg/m    Repeat blood pressure by me was 98/72  Wt Readings from Last 3 Encounters:  04/20/19 217 lb 6.4 oz (98.6 kg)  04/13/19 218 lb 12.8 oz (99.2 kg)  03/30/19 215 lb (97.5 kg)   General: Alert, oriented, no distress.  Appears euvolemic Skin: normal turgor, no rashes, warm and dry HEENT:  Normocephalic, atraumatic. Pupils equal round and reactive to light; sclera anicteric; extraocular muscles intact;  Nose without nasal septal hypertrophy Mouth/Parynx benign; Mallinpatti scale Neck: No JVD; 7 cm, no carotid bruits; normal carotid upstroke Lungs: clear to ausculatation and percussion; no wheezing or rales Chest wall: without tenderness to palpitation Heart: PMI not displaced, RRR, s1 s2 normal, 1/6 systolic murmur, no diastolic murmur, no rubs, gallops, thrills, or heaves Abdomen: soft, nontender; no hepatosplenomehaly, BS+; abdominal aorta nontender and not dilated by palpation. Back: no CVA tenderness Pulses 2+ Musculoskeletal: full range of motion, normal strength, no joint deformities Extremities: no clubbing cyanosis or edema, Homan's sign negative  Neurologic: grossly nonfocal; Cranial nerves grossly wnl Psychologic: Normal mood and affect   ECG (independently read by me): sinus bradycardia 57 bpm.  PVC.  Low voltage.  QTc interval 451 ms.  September 2017 ECG (independently read by me): Not done, but his ECG from 02/19/2016 was reviewed and reveals sinus bradycardia at 56, left posterior hemiblock, lateral T-wave changes, and small Q waves in lead 3 and aVF.  LABS:  BMP Latest Ref Rng & Units 04/13/2019 03/04/2019 11/25/2018  Glucose 65 - 99 mg/dL 97 97 90  BUN 6 - 24 mg/dL _0 Creatinine 0.76 - 1.27 mg/dL 1.41(H) 1.57(H) 1.60(H)  BUN/Creat Ratio 9 - 20 13 - -  Sodium 134 - 144 mmol/L 139 140 140  Potassium 3.5 - 5.2 mmol/L 4.4 4.0 4.5  Chloride 96 - 106 mmol/L 100 104 102  CO2 20 - 29 mmol/L _1 Calcium 8.7 - 10.2 mg/dL 10.0 9.4 9.7     Hepatic Function Latest Ref Rng & Units 11/25/2018 08/01/2016 04/29/2015  Total Protein 6.0 - 8.3 g/dL 6.7 6.8 6.5  Albumin 3.5 - 5.2 g/dL 4.2 3.5 3.8  AST 0 - 37 U/L _2 ALT 0 - 53 U/L 34 25 28  Alk Phosphatase 39 - 117 U/L 51 46 40  Total Bilirubin 0.2 - 1.2 mg/dL 0.5 1.5(H) 0.3  Bilirubin, Direct 0.0 - 0.3  mg/dL - - -     CBC Latest Ref Rng & Units 11/25/2018 09/21/2018 06/10/2018  WBC 4.0 - 10.5 K/uL 5.5 8.5 7.4  Hemoglobin 13.0 - 17.0 g/dL 13.7 16.0 15.4  Hematocrit 39.0 - 52.0 % 41.7 48.0 48.4  Platelets 150.0 - 400.0 K/uL 142.0(L) 249 148(L)     Lipid Panel     Component Value Date/Time   CHOL 175 11/25/2018 1640   TRIG 186.0 (H) 11/25/2018 1640   HDL 45.20 11/25/2018 1640   CHOLHDL 4 11/25/2018 1640   VLDL 37.2 11/25/2018 1640   LDLCALC 93 11/25/2018 1640   LDLDIRECT 166.4 05/17/2010 0905     RADIOLOGY: No results found.  IMPRESSION:  No diagnosis found.   ASSESSMENT AND PLAN: Bruce Robertson is a 58 year old African-American male who has a history of a cardiomyopathy.  In the past his ejection fraction was in the 40-45% range, but this has further reduced to approximately 25%.  In 2017 he was diagnosed with  moderately severe sleep apnea with an overall AHI of 26.7.  However, there was a significant positional component with supine sleep AHI at 43.3 per hour, consistent with severe sleep apnea with supine posture.  A download at that time demonstrated  an AHI of 6.3 at 9 cm water pressure.  I adjusted him to an auto mode due to the severity of his sleep apnea with supine sleep and the need for increased pressure requirements.  He is now status post ICD implantation for his significant LV dysfunction.  His last echo Doppler study in May 2019 continue to show an EF of 20 to 25%.  He has had several heart failure hospitalizations with significant response to diuretic therapy.  Ideally, with his documentation of central events he would be a candidate for ASV therapy.  However based on the Serve heart failure trial which used equipment that is no longer in use that had inability to adjust pressure support or make other adjustments accordingly until the new trial with new equipment states otherwise I could not prescribe ASV therapy.  His sleep is now improved but he continues to have  significant central events with a central apnea index of 23.9 on his most recent download with overall AHI of 26.8.  I will attempt to increase his pressure support from 4 to 6 cm.  I will decrease his EPAP minimum to 6 but increase his IPAP maximum to 16.  I am further reducing ramp time from 45 minutes down to 28.  Hopefully prior to his next evaluation I will have the results of the new heart failure trial with ASV therapy.  I will obtain a download in 4 weeks and see him in the office in 4 to 6 months or sooner as needed.  Time spent: 30 minutes Troy Sine, MD, [facility]  04/20/2019 1:52 PM

## 2019-04-20 NOTE — Patient Instructions (Signed)
Medication Instructions:  The current medical regimen is effective;  continue present plan and medications.  If you need a refill on your cardiac medications before your next appointment, please call your pharmacy.   Follow-Up: At [facility], you and your health needs are our priority.  As part of our continuing mission to provide you with exceptional heart care, we have created designated Provider Care Teams.  These Care Teams include your primary Cardiologist (physician) and Advanced Practice Providers (APPs -  Physician Assistants and Nurse Practitioners) who all work together to provide you with the care you need, when you need it. You will need a follow up appointment in 4-6 months.  Please call our office 2 months in advance to schedule this appointment.  You may see Dr.Kelly (sleep) or one of the following Advanced Practice Providers on your designated Care Team: Almyra Deforest, [place] . Fabian Sharp, PA-C  Any Other Special Instructions Will Be Listed Below (If Applicable). Changes were made to machine and sent.  Download in 4 weeks.

## 2019-04-23 ENCOUNTER — Other Ambulatory Visit: Payer: Self-pay | Admitting: *Deleted

## 2019-04-25 ENCOUNTER — Encounter: Payer: Self-pay | Admitting: Cardiovascular Disease

## 2019-05-01 DIAGNOSIS — G4733 Obstructive sleep apnea (adult) (pediatric): Secondary | ICD-10-CM | POA: Diagnosis not present

## 2019-05-19 ENCOUNTER — Inpatient Hospital Stay: Admission: RE | Admit: 2019-05-19 | Payer: Medicare HMO | Source: Ambulatory Visit

## 2019-05-19 ENCOUNTER — Other Ambulatory Visit: Payer: Self-pay

## 2019-05-19 ENCOUNTER — Ambulatory Visit
Admission: RE | Admit: 2019-05-19 | Discharge: 2019-05-19 | Disposition: A | Discharge status: Home / Self Care | Payer: Medicare HMO | Source: Ambulatory Visit | Attending: Orthopaedic Surgery | Admitting: Orthopaedic Surgery

## 2019-05-19 ENCOUNTER — Other Ambulatory Visit: Payer: Medicare HMO

## 2019-05-19 ENCOUNTER — Telehealth: Payer: Self-pay | Admitting: Orthopaedic Surgery

## 2019-05-19 DIAGNOSIS — M25511 Pain in right shoulder: Secondary | ICD-10-CM

## 2019-05-19 DIAGNOSIS — M25522 Pain in left elbow: Secondary | ICD-10-CM

## 2019-05-19 DIAGNOSIS — G8929 Other chronic pain: Secondary | ICD-10-CM

## 2019-05-19 DIAGNOSIS — M7532 Calcific tendinitis of left shoulder: Secondary | ICD-10-CM | POA: Diagnosis not present

## 2019-05-19 NOTE — Telephone Encounter (Signed)
Patient called stating he is scheduled for [facility] at [facility] today 05/19/19 at 3:30 pm.  Patient states he received a call from [facility] stating his authorization from [facility] has expired. Patient states he was told that [facility] has been trying to notify the office to get authorization and have not received a call back.  Patient is requesting a return call.

## 2019-05-19 NOTE — Telephone Encounter (Signed)
Left vm with pt requesting call back

## 2019-05-20 NOTE — Progress Notes (Signed)
Needs f/u appt please.  Thanks.

## 2019-05-25 ENCOUNTER — Ambulatory Visit: Payer: Medicare HMO | Admitting: Orthopaedic Surgery

## 2019-05-27 ENCOUNTER — Ambulatory Visit (INDEPENDENT_AMBULATORY_CARE_PROVIDER_SITE_OTHER): Payer: Medicare HMO | Admitting: Orthopaedic Surgery

## 2019-05-27 DIAGNOSIS — M25522 Pain in left elbow: Secondary | ICD-10-CM | POA: Diagnosis not present

## 2019-05-27 NOTE — Progress Notes (Signed)
Office Visit Note   Patient: Bruce Robertson           Date of Birth: Mar 26, 1961           MRN: [id number] Visit Date: 05/27/2019              Requested by: Caren Macadam, MD [place],  [place] [address] PCP: Caren Macadam, MD   Assessment & Plan: Visit Diagnoses:  1. Pain in left elbow     Plan: Impression is left cubital tunnel syndrome.  I did review the CT scan which shows some loose bodies within the posterior elbow joint and some tendinosis of the triceps insertion as well as degenerative changes within the elbow joint but I do not feel that this is consistent with his presentation.  We will send him to Dr. Ernestina Patches for nerve conduction studies to evaluate for cubital tunnel syndrome.  He will follow-up afterwards.  Follow-Up Instructions: Return if symptoms worsen or fail to improve.   Orders:  No orders of the defined types were placed in this encounter.  No orders of the defined types were placed in this encounter.     Procedures: No procedures performed   Clinical Data: No additional findings.   Subjective: Chief Complaint  Patient presents with  . Left Elbow - Pain    CT scan review    Bruce Robertson returns today for review of his left elbow CT scan.  He could not have MRI due to defibrillator.  He states that his pain is still within the cubital tunnel.  He really does not have any bony tenderness.  The pain is worse with elbow flexion and he does endorse associated tingling without any numbness.   Review of Systems  Constitutional: Negative.   All other systems reviewed and are negative.    Objective: Vital Signs: There were no vitals taken for this visit.  Physical Exam Vitals signs and nursing note reviewed.  Constitutional:      Appearance: He is well-developed.  HENT:     Head: Normocephalic and atraumatic.  Eyes:     Pupils: Pupils are equal, round, and reactive to light.  Neck:     Musculoskeletal: Neck supple.   Pulmonary:     Effort: Pulmonary effort is normal.  Abdominal:     Palpations: Abdomen is soft.  Musculoskeletal: Normal range of motion.  Skin:    General: Skin is warm.  Neurological:     Mental Status: He is alert and oriented to person, place, and time.  Psychiatric:        Behavior: Behavior normal.        Thought Content: Thought content normal.        Judgment: Judgment normal.     Ortho Exam Left elbow exam shows an exquisitely positive Tinel's at the cubital tunnel.  He has no muscle atrophy of the hand.  Full range of motion of the elbow.  Medial epicondyles nontender and the olecranon is only mildly tender. Specialty Comments:  No specialty comments available.  Imaging: No results found.   PMFS History: Patient Active Problem List   Diagnosis Date Noted  . Prediabetes 06/10/2018  . Thrombocytopenia ([place]) 06/10/2018  . AICD (automatic cardioverter/defibrillator) present 06/10/2018  . History of peptic ulcer disease 06/10/2018  . Non-ischemic cardiomyopathy ([place])   . NSVT (nonsustained ventricular tachycardia) ([place])   . Hypotension due to drugs 02/09/2018  . Polyp of colon 04/29/2016  . Dyspnea on exertion 03/05/2016  .  Essential hypertension 10/31/2015  . Acute on chronic systolic heart failure ([place]) 08/29/2015  . Hx of CABG 08/29/2015  . Stage 3 chronic kidney disease ([place]) 08/29/2015  . OSA (obstructive sleep apnea) 06/10/2011  . Fatigue 06/10/2011  . Obesity, unspecified 05/02/2010  . Hemorrhoids 02/08/2010  . ESOPHAGEAL REFLUX 02/08/2010  . Rectal bleeding 02/08/2010  . Hyperlipidemia 04/06/2009  . TOBACCO USER 04/06/2009  . Cardiomyopathy, dilated, nonischemic ([place]) 04/06/2009   Past Medical History:  Diagnosis Date  . AICD (automatic cardioverter/defibrillator) present   . Anxiety   . Cardiomyopathy    Nonischemic. EF has been about 45%. S/P CABG.;  b.  Echo 4/14: EF 25%, global HK with inf and mid apical AK, restrictive physiology with E/e' >  15 (elevated LV filling pressure), trivial AI/MR, mild to mod LAE, mild RVE, mild reduced RVSF, mild RAE, mod TR, PASP 74 (severe pulmonary HTN)   . CHF (congestive heart failure) ([place])   . CHF exacerbation ([place]) 07/31/2016  . Colon polyps 2002  . Coronary artery disease   . Depression   . Dyslipidemia    hx (03/20/2017)  . GERD (gastroesophageal reflux disease)   . History of bleeding peptic ulcer   . History of echocardiogram    Echo 10/16:  EF 25-30%, poss non-compaction, diff HK with inf-lat and apical HK, restrictive physio, mild AI, severe LAE, mild RVE with mild reduced RVSF, PASP 42 mmHg  . Hyperplastic rectal polyp   . OSA on CPAP    Mild  . Tobacco abuse    Remote    Family History  Problem Relation Age of Onset  . Hypertension Mother   . Osteoarthritis Mother   . Heart failure Father 72  . High blood pressure Father   . Lung cancer Other   . Cancer Maternal Grandmother        uncertain type  . Bipolar disorder Daughter   . Diabetes Daughter   . Cancer Maternal Uncle        uncertain type  . Diabetes Niece     Past Surgical History:  Procedure Laterality Date  . CARDIAC CATHETERIZATION  2007;  2009  . CARDIAC DEFIBRILLATOR PLACEMENT  03/20/2017  . COLONOSCOPY WITH PROPOFOL N/A 01/21/2019   Procedure: COLONOSCOPY WITH PROPOFOL;  Surgeon: Milus Banister, MD;  Location: [facility];  Service: Endoscopy;  Laterality: N/A;  . CORONARY ARTERY BYPASS GRAFT  2007   Had a left main dissection after catheterization. Underwent a saphenous vein graft to the LAD and a saphenous vein graft to obtuse marginal.  . ICD IMPLANT N/A 03/20/2017   Procedure: ICD Implant;  Surgeon: Constance Haw, MD;  Location: [facility];  Service: Cardiovascular;  Laterality: N/A;  . PATELLAR TENDON REPAIR Right 2005  . POLYPECTOMY  01/21/2019   Procedure: POLYPECTOMY;  Surgeon: Milus Banister, MD;  Location: [facility];  Service: Endoscopy;;   Social History   Occupational  History  . Occupation: Civil engineer, contracting    Employer: [employer]  Tobacco Use  . Smoking status: Former Smoker    Packs/day: 0.10    Years: 7.00    Pack years: 0.70    Types: Cigarettes  . Smokeless tobacco: Never Used  . Tobacco comment: 03/20/2017 "quit before 2000"  Substance and Sexual Activity  . Alcohol use: Yes    Alcohol/week: 0.0 standard drinks    Comment: 03/20/2017 "might have 6 drinks/year @ the most"  . Drug use: Yes    Types: Marijuana  Comment: 03/20/2017 "joint qod"  . Sexual activity: Not Currently

## 2019-05-27 NOTE — Addendum Note (Signed)
Addended by: Marlyne Beards on: 05/27/2019 11:01 AM   Modules accepted: Orders

## 2019-05-31 DIAGNOSIS — G4733 Obstructive sleep apnea (adult) (pediatric): Secondary | ICD-10-CM | POA: Diagnosis not present

## 2019-06-08 ENCOUNTER — Other Ambulatory Visit: Payer: Self-pay

## 2019-06-08 ENCOUNTER — Ambulatory Visit
Admission: RE | Admit: 2019-06-08 | Discharge: 2019-06-08 | Disposition: A | Discharge status: Home / Self Care | Payer: Medicare HMO | Source: Ambulatory Visit | Attending: Orthopaedic Surgery | Admitting: Orthopaedic Surgery

## 2019-06-08 DIAGNOSIS — G8929 Other chronic pain: Secondary | ICD-10-CM

## 2019-06-08 DIAGNOSIS — M75121 Complete rotator cuff tear or rupture of right shoulder, not specified as traumatic: Secondary | ICD-10-CM | POA: Diagnosis not present

## 2019-06-08 DIAGNOSIS — M25511 Pain in right shoulder: Secondary | ICD-10-CM | POA: Diagnosis not present

## 2019-06-08 MED ORDER — IOPAMIDOL (ISOVUE-M 200) INJECTION 41%
1.0000 mL | Freq: Once | INTRAMUSCULAR | Status: AC
  Administered 2019-06-08: 13 mL via INTRA_ARTICULAR

## 2019-06-11 ENCOUNTER — Encounter: Payer: Medicare HMO | Admitting: Physical Medicine and Rehabilitation

## 2019-06-15 ENCOUNTER — Ambulatory Visit: Payer: Medicare HMO | Admitting: Orthopaedic Surgery

## 2019-06-22 ENCOUNTER — Ambulatory Visit (INDEPENDENT_AMBULATORY_CARE_PROVIDER_SITE_OTHER): Payer: Medicare HMO | Admitting: *Deleted

## 2019-06-22 DIAGNOSIS — I472 Ventricular tachycardia: Secondary | ICD-10-CM

## 2019-06-22 DIAGNOSIS — I428 Other cardiomyopathies: Secondary | ICD-10-CM

## 2019-06-22 DIAGNOSIS — I4729 Other ventricular tachycardia: Secondary | ICD-10-CM

## 2019-06-23 ENCOUNTER — Telehealth: Payer: Self-pay

## 2019-06-23 NOTE — Telephone Encounter (Signed)
Left message for patient to remind of missed remote transmission.  

## 2019-06-24 LAB — CUP PACEART REMOTE DEVICE CHECK
Battery Remaining Longevity: 121 mo
Battery Voltage: 3 V
Brady Statistic RV Percent Paced: 0.22 %
Date Time Interrogation Session: 20200820202546
HighPow Impedance: 63 Ohm
Implantable Lead Implant Date: 20180517
Implantable Lead Location: 753860
Implantable Pulse Generator Implant Date: 20180517
Lead Channel Impedance Value: 342 Ohm
Lead Channel Impedance Value: 456 Ohm
Lead Channel Pacing Threshold Amplitude: 0.625 V
Lead Channel Pacing Threshold Pulse Width: 0.4 ms
Lead Channel Sensing Intrinsic Amplitude: 14.75 mV
Lead Channel Sensing Intrinsic Amplitude: 14.75 mV
Lead Channel Setting Pacing Amplitude: 2.5 V
Lead Channel Setting Pacing Pulse Width: 0.4 ms
Lead Channel Setting Sensing Sensitivity: 0.3 mV

## 2019-06-25 ENCOUNTER — Ambulatory Visit (INDEPENDENT_AMBULATORY_CARE_PROVIDER_SITE_OTHER): Payer: Medicare HMO | Admitting: Physical Medicine and Rehabilitation

## 2019-06-25 ENCOUNTER — Encounter: Payer: Self-pay | Admitting: Physical Medicine and Rehabilitation

## 2019-06-25 DIAGNOSIS — R202 Paresthesia of skin: Secondary | ICD-10-CM | POA: Diagnosis not present

## 2019-06-25 NOTE — Progress Notes (Signed)
 .  Numeric Pain Rating Scale and Functional Assessment Average Pain 9   In the last MONTH (on 0-10 scale) has pain interfered with the following?  1. General activity like being  able to carry out your everyday physical activities such as walking, climbing stairs, carrying groceries, or moving a chair?  Rating(10)

## 2019-06-28 NOTE — Procedures (Signed)
EMG & NCV Findings: Evaluation of the left median motor nerve showed decreased conduction velocity (Elbow-Wrist, 49 m/s).  The left median (across palm) sensory nerve showed no response (Palm) and prolonged distal peak latency (4.2 ms).  The left ulnar sensory nerve showed prolonged distal peak latency (3.8 ms) and decreased conduction velocity (Wrist-5th Digit, 37 m/s).  All remaining nerves (as indicated in the following tables) were within normal limits.    All examined muscles (as indicated in the following table) showed no evidence of electrical instability.    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  Clearly, this would not support pain or tingling in the ulnar digits which he complains of but could not be ruled out for a source of elbow pain.  There was no specific evidence of ulnar neuropathy across the elbow or at the wrist.  There is no significant electrodiagnostic evidence of any other focal nerve entrapment, brachial plexopathy or cervical radiculopathy.   Recommendations: 1.  Follow-up with referring physician. 2.  Continue current management of symptoms.  This does not ruled out an ulnar neuritis at the elbow without frank nerve damage.  ___________________________ Laurence Spates FAAPMR Board Certified, American Board of Physical Medicine and Rehabilitation    Nerve Conduction Studies Anti Sensory Summary Table   Stim Site NR Peak (ms) Norm Peak (ms) P-T Amp (V) Norm P-T Amp Site1 Site2 Delta-P (ms) Dist (cm) Vel (m/s) Norm Vel (m/s)  Left Median Acr Palm Anti Sensory (2nd Digit)  33.3C  Wrist    *4.2 <3.6 15.0 >10 Wrist Palm  0.0    Palm *NR  <2.0          Left Radial Anti Sensory (Base 1st Digit)  32.9C  Wrist    2.2 <3.1 23.1  Wrist Base 1st Digit 2.2 0.0    Left Ulnar Anti Sensory (5th Digit)  33.4C  Wrist    *3.8 <3.7 15.8 >15.0 Wrist 5th Digit  3.8 14.0 *37 >38   Motor Summary Table   Stim Site NR Onset (ms) Norm Onset (ms) O-P Amp (mV) Norm O-P Amp Site1 Site2 Delta-0 (ms) Dist (cm) Vel (m/s) Norm Vel (m/s)  Left Median Motor (Abd Poll Brev)  33C  Wrist    4.1 <4.2 6.4 >5 Elbow Wrist 5.1 25.0 *49 >50  Elbow    9.2  6.1         Left Ulnar Motor (Abd Dig Min)  32.8C  Wrist    3.3 <4.2 4.7 >3 B Elbow Wrist 4.4 24.0 55 >53  B Elbow    7.7  3.7  A Elbow B Elbow 1.7 10.0 59 >53  A Elbow    9.4  3.5          EMG   Side Muscle Nerve Root Ins Act Fibs Psw Amp Dur Poly Recrt Int Fraser Din Comment  Left Abd Poll Brev Median C8-T1 Nml Nml Nml Nml Nml 0 Nml Nml   Left 1stDorInt Ulnar C8-T1 Nml Nml Nml Nml Nml 0 Nml Nml   Left PronatorTeres Median C6-7 Nml Nml Nml Nml Nml 0 Nml Nml   Left Biceps Musculocut C5-6 Nml Nml Nml Nml Nml 0 Nml Nml   Left Deltoid Axillary C5-6 Nml Nml Nml Nml Nml 0 Nml Nml     Nerve Conduction Studies Anti Sensory Left/Right Comparison   Stim Site L Lat (ms) R Lat (ms) L-R Lat (ms) L  Amp (V) R Amp (V) L-R Amp (%) Site1 Site2 L Vel (m/s) R Vel (m/s) L-R Vel (m/s)  Median Acr Palm Anti Sensory (2nd Digit)  33.3C  Wrist *4.2   15.0   Wrist Palm     Palm             Radial Anti Sensory (Base 1st Digit)  32.9C  Wrist 2.2   23.1   Wrist Base 1st Digit     Ulnar Anti Sensory (5th Digit)  33.4C  Wrist *3.8   15.8   Wrist 5th Digit *37     Motor Left/Right Comparison   Stim Site L Lat (ms) R Lat (ms) L-R Lat (ms) L Amp (mV) R Amp (mV) L-R Amp (%) Site1 Site2 L Vel (m/s) R Vel (m/s) L-R Vel (m/s)  Median Motor (Abd Poll Brev)  33C  Wrist 4.1   6.4   Elbow Wrist *49    Elbow 9.2   6.1         Ulnar Motor (Abd Dig Min)  32.8C  Wrist 3.3   4.7   B Elbow Wrist 55    B Elbow 7.7   3.7   A Elbow B Elbow 59    A Elbow 9.4   3.5            Waveforms:

## 2019-06-28 NOTE — Progress Notes (Signed)
Bruce Robertson - 58 y.o. male MRN [id number]  Date of birth: Nov 19, 1960  Office Visit Note: Visit Date: 06/25/2019 PCP: Caren Macadam, MD Referred by: Caren Macadam, MD  Subjective: Chief Complaint  Patient presents with  . Left Elbow - Pain  . Left Shoulder - Pain   HPI:  Bruce Robertson is a 58 y.o. male who comes in today At the request of Dr. Eduard Roux for electrodiagnostic study of the left upper limb.  Patient is right-hand dominant and reports symptoms began over a year ago which include pain in the left shoulder and left elbow.  Elbow pain is more medially.  He reports laying on the left elbow and picking up items makes his pain worse.  He reports nothing seems to help.  He rates his pain as a 9 out of 10.  On occasion but is not his biggest complaint he gets some paresthesia into the ulnar digits.  Dr. Erlinda Hong did obtain CT scan of the arm and elbow but did not feel like those findings were consistent with the presentation of the patient.  He felt like he had a pretty significantly positive Tinel sign over the left ulnar groove.  Patient has not had any prior electrodiagnostic studies.  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  ROS Otherwise per HPI.  Assessment & Plan: Visit Diagnoses:  1. 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  Clearly, this would not support pain or tingling in the ulnar digits which he complains of but could not be ruled out for a source of elbow pain.  There was no specific evidence of ulnar neuropathy across the elbow or at the wrist.  There is no significant electrodiagnostic evidence of any other focal nerve entrapment, brachial plexopathy or cervical radiculopathy.   Recommendations: 1.  Follow-up with referring physician. 2.  Continue  current management of symptoms. This does not ruled out an ulnar neuritis at the elbow without frank nerve damage.  Meds & Orders: No orders of the defined types were placed in this encounter.   Orders Placed This Encounter  Procedures  . NCV with EMG (electromyography)    Follow-up: Return for Eduard Roux, MD.   Procedures: No procedures performed  EMG & NCV Findings: Evaluation of the left median motor nerve showed decreased conduction velocity (Elbow-Wrist, 49 m/s).  The left median (across palm) sensory nerve showed no response (Palm) and prolonged distal peak latency (4.2 ms).  The left ulnar sensory nerve showed prolonged distal peak latency (3.8 ms) and decreased conduction velocity (Wrist-5th Digit, 37 m/s).  All remaining nerves (as indicated in the following tables) were within normal limits.    All examined muscles (as indicated in the following table) showed no evidence of electrical instability.    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  Clearly, this would not support pain or tingling in the ulnar digits which he complains of but could not be ruled out for a source of elbow pain.  There was no specific evidence of ulnar neuropathy across the elbow or at the wrist.  There is no significant electrodiagnostic evidence of any other focal nerve entrapment, brachial plexopathy or cervical radiculopathy.   Recommendations: 1.  Follow-up with referring  physician. 2.  Continue current management of symptoms.  ___________________________ Laurence Spates FAAPMR Board Certified, American Board of Physical Medicine and Rehabilitation    Nerve Conduction Studies Anti Sensory Summary Table   Stim Site NR Peak (ms) Norm Peak (ms) P-T Amp (V) Norm P-T Amp Site1 Site2 Delta-P (ms) Dist (cm) Vel (m/s) Norm Vel (m/s)  Left Median Acr Palm Anti Sensory (2nd Digit)  33.3C   Wrist    *4.2 <3.6 15.0 >10 Wrist Palm  0.0    Palm *NR  <2.0          Left Radial Anti Sensory (Base 1st Digit)  32.9C  Wrist    2.2 <3.1 23.1  Wrist Base 1st Digit 2.2 0.0    Left Ulnar Anti Sensory (5th Digit)  33.4C  Wrist    *3.8 <3.7 15.8 >15.0 Wrist 5th Digit 3.8 14.0 *37 >38   Motor Summary Table   Stim Site NR Onset (ms) Norm Onset (ms) O-P Amp (mV) Norm O-P Amp Site1 Site2 Delta-0 (ms) Dist (cm) Vel (m/s) Norm Vel (m/s)  Left Median Motor (Abd Poll Brev)  33C  Wrist    4.1 <4.2 6.4 >5 Elbow Wrist 5.1 25.0 *49 >50  Elbow    9.2  6.1         Left Ulnar Motor (Abd Dig Min)  32.8C  Wrist    3.3 <4.2 4.7 >3 B Elbow Wrist 4.4 24.0 55 >53  B Elbow    7.7  3.7  A Elbow B Elbow 1.7 10.0 59 >53  A Elbow    9.4  3.5          EMG   Side Muscle Nerve Root Ins Act Fibs Psw Amp Dur Poly Recrt Int Fraser Din Comment  Left Abd Poll Brev Median C8-T1 Nml Nml Nml Nml Nml 0 Nml Nml   Left 1stDorInt Ulnar C8-T1 Nml Nml Nml Nml Nml 0 Nml Nml   Left PronatorTeres Median C6-7 Nml Nml Nml Nml Nml 0 Nml Nml   Left Biceps Musculocut C5-6 Nml Nml Nml Nml Nml 0 Nml Nml   Left Deltoid Axillary C5-6 Nml Nml Nml Nml Nml 0 Nml Nml     Nerve Conduction Studies Anti Sensory Left/Right Comparison   Stim Site L Lat (ms) R Lat (ms) L-R Lat (ms) L Amp (V) R Amp (V) L-R Amp (%) Site1 Site2 L Vel (m/s) R Vel (m/s) L-R Vel (m/s)  Median Acr Palm Anti Sensory (2nd Digit)  33.3C  Wrist *4.2   15.0   Wrist Palm     Palm             Radial Anti Sensory (Base 1st Digit)  32.9C  Wrist 2.2   23.1   Wrist Base 1st Digit     Ulnar Anti Sensory (5th Digit)  33.4C  Wrist *3.8   15.8   Wrist 5th Digit *37     Motor Left/Right Comparison   Stim Site L Lat (ms) R Lat (ms) L-R Lat (ms) L Amp (mV) R Amp (mV) L-R Amp (%) Site1 Site2 L Vel (m/s) R Vel (m/s) L-R Vel (m/s)  Median Motor (Abd Poll Brev)  33C  Wrist 4.1   6.4   Elbow Wrist *49    Elbow 9.2   6.1         Ulnar Motor (Abd Dig Min)  32.8C  Wrist 3.3    4.7   B Elbow Wrist 55    B Elbow 7.7   3.7  A Elbow B Elbow 59    A Elbow 9.4   3.5            Waveforms:            Clinical History: No specialty comments available.     Objective:  VS:  HT:    WT:   BMI:     BP:   HR: bpm  TEMP: ( )  RESP:  Physical Exam Musculoskeletal:        General: Tenderness present.     Comments: Inspection reveals no atrophy of the bilateral APB or FDI or hand intrinsics. There is no swelling, color changes, allodynia or dystrophic changes. There is 5 out of 5 strength in the bilateral wrist extension, finger abduction and long finger flexion. There is intact sensation to light touch in all dermatomal and peripheral nerve distributions.  There is tenderness over the ulnar groove. There is a negative Hoffmann's test bilaterally.  Skin:    General: Skin is warm and dry.     Findings: No erythema or rash.  Neurological:     General: No focal deficit present.     Mental Status: He is alert and oriented to person, place, and time.     Sensory: No sensory deficit.     Motor: No weakness or abnormal muscle tone.     Coordination: Coordination normal.     Gait: Gait normal.  Psychiatric:        Mood and Affect: Mood normal.        Behavior: Behavior normal.        Thought Content: Thought content normal.     Ortho Exam Imaging: No results found.

## 2019-06-29 ENCOUNTER — Ambulatory Visit (INDEPENDENT_AMBULATORY_CARE_PROVIDER_SITE_OTHER): Payer: Medicare HMO

## 2019-06-29 ENCOUNTER — Ambulatory Visit (INDEPENDENT_AMBULATORY_CARE_PROVIDER_SITE_OTHER): Payer: Medicare HMO | Admitting: Orthopaedic Surgery

## 2019-06-29 ENCOUNTER — Encounter: Payer: Self-pay | Admitting: Orthopaedic Surgery

## 2019-06-29 DIAGNOSIS — M25522 Pain in left elbow: Secondary | ICD-10-CM

## 2019-06-29 DIAGNOSIS — M5412 Radiculopathy, cervical region: Secondary | ICD-10-CM

## 2019-06-29 DIAGNOSIS — M75121 Complete rotator cuff tear or rupture of right shoulder, not specified as traumatic: Secondary | ICD-10-CM | POA: Diagnosis not present

## 2019-06-29 MED ORDER — LIDOCAINE HCL 1 % IJ SOLN
1.0000 mL | INTRAMUSCULAR | Status: AC | PRN
  Administered 2019-06-29: 16:00:00 1 mL

## 2019-06-29 MED ORDER — METHYLPREDNISOLONE ACETATE 40 MG/ML IJ SUSP
40.0000 mg | INTRAMUSCULAR | Status: AC | PRN
  Administered 2019-06-29: 40 mg via INTRA_ARTICULAR

## 2019-06-29 MED ORDER — BUPIVACAINE HCL 0.5 % IJ SOLN
1.0000 mL | INTRAMUSCULAR | Status: AC | PRN
  Administered 2019-06-29: 1 mL via INTRA_ARTICULAR

## 2019-06-29 NOTE — Progress Notes (Signed)
Office Visit Note   Patient: Bruce Robertson           Date of Birth: [birth year]           MRN: [id number] Visit Date: 06/29/2019              Requested by: Caren Macadam, MD [place],  [place] [id number] PCP: Caren Macadam, MD   Assessment & Plan: Visit Diagnoses:  1. Nontraumatic complete tear of right rotator cuff   2. Radiculopathy, cervical region   3. Pain in left elbow     Plan: Impression is 58 year old gentleman with full-thickness rotator cuff found on CT arthrogram and left medial elbow pain.  From the right shoulder standpoint he is functionally very well compensated but he states that when he exerts himself it does cause pain.  Clinically speaking he does quite well with 95% of his activities other than heavy lifting which sounds like his fairly important to him.  In general I think this would be an activity to avoid to maintain longevity of his right shoulder.  He would like to avoid surgery if possible and I think he can try a course of outpatient physical therapy see if this would help with the pain with exertion.  With his elbow he is very symptomatic in the cubital tunnel and I believe that he may have some ulnar neuritis.  This was injected today.  Patient tolerated this well.  I like to see him back as needed.  Follow-Up Instructions: Return if symptoms worsen or fail to improve.   Orders:  Orders Placed This Encounter  Procedures  . XR Cervical Spine 2 or 3 views   No orders of the defined types were placed in this encounter.     Procedures: Medium Joint Inj on 06/29/2019 3:45 PM Indications: pain Details: 25 G needle Medications: 1 mL lidocaine 1 %; 1 mL bupivacaine 0.5 %; 40 mg methylPREDNISolone acetate 40 MG/ML Outcome: tolerated well, no immediate complications Patient was prepped and draped in the usual sterile fashion.       Clinical Data: No additional findings.   Subjective: Chief Complaint  Patient presents  with  . Left Elbow - Pain, Follow-up  . Right Shoulder - Pain, Follow-up    Bruce Robertson is here to review nerve conduction studies and his right shoulder CT arthrogram.  In terms of the nerve conduction studies afternoon was able to find mild carpal tunnel syndrome but no evidence of cubital tunnel syndrome.  He states that he has discomfort and numbness in his ulnar 2 digits when he rests his elbow down.  Otherwise he does not have any symptoms.  The elbow causes him constant aching sharp pain is worse with pushing away and elbow extension.  He has no problems with elbow flexion and lifting.  In terms of the shoulder he is not have any problems with ADLs only when he does heavy lifting above the level of his shoulder.  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  Denies any radicular symptoms.   Review of Systems  Constitutional: Negative.   All other systems reviewed and are negative.    Objective: Vital Signs: There were no vitals taken for this visit.  Physical Exam Vitals signs and nursing note reviewed.  Constitutional:      Appearance: He is well-developed.  Pulmonary:     Effort: Pulmonary effort is normal.  Abdominal:     Palpations: Abdomen is soft.  Skin:  General: Skin is warm.  Neurological:     Mental Status: He is alert and oriented to person, place, and time.  Psychiatric:        Behavior: Behavior normal.        Thought Content: Thought content normal.        Judgment: Judgment normal.     Ortho Exam Left elbow exam shows minimal tenderness of the olecranon and medial epicondyle.  He is fairly tender in the cubital tunnel and with Tinel.  He has no distal muscle atrophy. Right shoulder exam shows normal strength with empty can testing and infraspinatus testing.  He has good range of motion.  He does not have any significant impingement signs. Specialty Comments:  No specialty comments available.  Imaging: Xr Cervical Spine 2 Or 3 Views  Result Date:  06/29/2019 Normal lordosis.  No significant degenerative changes.  No acute or structural abnormalities.    PMFS History: Patient Active Problem List   Diagnosis Date Noted  . Nontraumatic complete tear of right rotator cuff 06/29/2019  . Pain in left elbow 06/29/2019  . Prediabetes 06/10/2018  . Thrombocytopenia ([place]) 06/10/2018  . AICD (automatic cardioverter/defibrillator) present 06/10/2018  . History of peptic ulcer disease 06/10/2018  . Non-ischemic cardiomyopathy ([place])   . NSVT (nonsustained ventricular tachycardia) ([place])   . Hypotension due to drugs 02/09/2018  . Polyp of colon 04/29/2016  . Dyspnea on exertion 03/05/2016  . Essential hypertension 10/31/2015  . Acute on chronic systolic heart failure ([place]) 08/29/2015  . Hx of CABG 08/29/2015  . Stage 3 chronic kidney disease ([place]) 08/29/2015  . OSA (obstructive sleep apnea) 06/10/2011  . Fatigue 06/10/2011  . Obesity, unspecified 05/02/2010  . Hemorrhoids 02/08/2010  . ESOPHAGEAL REFLUX 02/08/2010  . Rectal bleeding 02/08/2010  . Hyperlipidemia 04/06/2009  . TOBACCO USER 04/06/2009  . Cardiomyopathy, dilated, nonischemic ([place]) 04/06/2009   Past Medical History:  Diagnosis Date  . AICD (automatic cardioverter/defibrillator) present   . Anxiety   . Cardiomyopathy    Nonischemic. EF has been about 45%. S/P CABG.;  b.  Echo 4/14: EF 25%, global HK with inf and mid apical AK, restrictive physiology with E/e' > 15 (elevated LV filling pressure), trivial AI/MR, mild to mod LAE, mild RVE, mild reduced RVSF, mild RAE, mod TR, PASP 74 (severe pulmonary HTN)   . CHF (congestive heart failure) ([place])   . CHF exacerbation ([place]) 07/31/2016  . Colon polyps 2002  . Coronary artery disease   . Depression   . Dyslipidemia    hx (03/20/2017)  . GERD (gastroesophageal reflux disease)   . History of bleeding peptic ulcer   . History of echocardiogram    Echo 10/16:  EF 25-30%, poss non-compaction, diff HK with inf-lat and apical HK,  restrictive physio, mild AI, severe LAE, mild RVE with mild reduced RVSF, PASP 42 mmHg  . Hyperplastic rectal polyp   . OSA on CPAP    Mild  . Tobacco abuse    Remote    Family History  Problem Relation Age of Onset  . Hypertension Mother   . Osteoarthritis Mother   . Heart failure Father 25  . High blood pressure Father   . Lung cancer Other   . Cancer Maternal Grandmother        uncertain type  . Bipolar disorder Daughter   . Diabetes Daughter   . Cancer Maternal Uncle        uncertain type  . Diabetes Niece     Past  Surgical History:  Procedure Laterality Date  . CARDIAC CATHETERIZATION  2007;  2009  . CARDIAC DEFIBRILLATOR PLACEMENT  03/20/2017  . COLONOSCOPY WITH PROPOFOL N/A 01/21/2019   Procedure: COLONOSCOPY WITH PROPOFOL;  Surgeon: Milus Banister, MD;  Location: [facility];  Service: Endoscopy;  Laterality: N/A;  . CORONARY ARTERY BYPASS GRAFT  2007   Had a left main dissection after catheterization. Underwent a saphenous vein graft to the LAD and a saphenous vein graft to obtuse marginal.  . ICD IMPLANT N/A 03/20/2017   Procedure: ICD Implant;  Surgeon: Constance Haw, MD;  Location: [facility];  Service: Cardiovascular;  Laterality: N/A;  . PATELLAR TENDON REPAIR Right 2005  . POLYPECTOMY  01/21/2019   Procedure: POLYPECTOMY;  Surgeon: Milus Banister, MD;  Location: [facility];  Service: Endoscopy;;   Social History   Occupational History  . Occupation: Civil engineer, contracting    Employer: [employer]  Tobacco Use  . Smoking status: Former Smoker    Packs/day: 0.10    Years: 7.00    Pack years: 0.70    Types: Cigarettes  . Smokeless tobacco: Never Used  . Tobacco comment: 03/20/2017 "quit before 2000"  Substance and Sexual Activity  . Alcohol use: Yes    Alcohol/week: 0.0 standard drinks    Comment: 03/20/2017 "might have 6 drinks/year @ the most"  . Drug use: Yes    Types: Marijuana    Comment: 03/20/2017 "joint qod"  . Sexual activity:  Not Currently

## 2019-06-30 NOTE — Progress Notes (Signed)
Remote ICD transmission.   

## 2019-07-01 DIAGNOSIS — G4733 Obstructive sleep apnea (adult) (pediatric): Secondary | ICD-10-CM | POA: Diagnosis not present

## 2019-07-18 DIAGNOSIS — Z95810 Presence of automatic (implantable) cardiac defibrillator: Secondary | ICD-10-CM | POA: Insufficient documentation

## 2019-07-18 NOTE — Progress Notes (Signed)
Cardiology Office Note   Date:  07/19/2019   ID:  Bruce Robertson, DOB 11-04-1961, MRN [id number]  PCP:  Caren Macadam, MD  Cardiologist:   Minus Breeding, MD   Chief Complaint  Patient presents with  . Fatigue      History of Present Illness: Bruce Robertson is a 58 y.o. male who presents for follow up of a non-ischemic cardiomyopathy (EF initially 40%, improved to 45%, then dropped to 20-25%), s/p CABG in 2007 due to diagnostic cath complicated by LM dissection, s/p ICD for primary prevention.  He presents for follow up.  Since I last saw him he continues to do relatively well.  He has had a harder time with the pandemic because he cannot get to the gym.  He is doing a little walking and trying to increase this.  He can start doing some light weights.  He is also limited by right shoulder pain that might need surgery at some point in time.  He does have some mild dyspnea on exertion but this is better than it was previously.  He has some mild fatigue and notices that this is clearly related to how well he sleeps.  He is having sleep evaluation and adjustment to his machine and might be getting a new machine in the future pending the approval and release of this new device per Dr. Evette Georges note.  The patient denies any acute symptoms such as chest pressure, neck or arm discomfort.  Not describing PND or orthopnea.  Is had no weight gain or edema.   Past Medical History:  Diagnosis Date  . AICD (automatic cardioverter/defibrillator) present   . Anxiety   . Cardiomyopathy    Nonischemic. EF has been about 45%. S/P CABG.;  b.  Echo 4/14: EF 25%, global HK with inf and mid apical AK, restrictive physiology with E/e' > 15 (elevated LV filling pressure), trivial AI/MR, mild to mod LAE, mild RVE, mild reduced RVSF, mild RAE, mod TR, PASP 74 (severe pulmonary HTN)   . CHF (congestive heart failure) ([place])   . CHF exacerbation ([place]) 07/31/2016  . Colon polyps 2002  . Coronary artery  disease   . Depression   . Dyslipidemia    hx (03/20/2017)  . GERD (gastroesophageal reflux disease)   . History of bleeding peptic ulcer   . History of echocardiogram    Echo 10/16:  EF 25-30%, poss non-compaction, diff HK with inf-lat and apical HK, restrictive physio, mild AI, severe LAE, mild RVE with mild reduced RVSF, PASP 42 mmHg  . Hyperplastic rectal polyp   . OSA on CPAP    Mild  . Tobacco abuse    Remote    Past Surgical History:  Procedure Laterality Date  . CARDIAC CATHETERIZATION  2007;  2009  . CARDIAC DEFIBRILLATOR PLACEMENT  03/20/2017  . COLONOSCOPY WITH PROPOFOL N/A 01/21/2019   Procedure: COLONOSCOPY WITH PROPOFOL;  Surgeon: Milus Banister, MD;  Location: [facility];  Service: Endoscopy;  Laterality: N/A;  . CORONARY ARTERY BYPASS GRAFT  2007   Had a left main dissection after catheterization. Underwent a saphenous vein graft to the LAD and a saphenous vein graft to obtuse marginal.  . ICD IMPLANT N/A 03/20/2017   Procedure: ICD Implant;  Surgeon: Constance Haw, MD;  Location: [facility];  Service: Cardiovascular;  Laterality: N/A;  . PATELLAR TENDON REPAIR Right 2005  . POLYPECTOMY  01/21/2019   Procedure: POLYPECTOMY;  Surgeon: Milus Banister, MD;  Location:  [facility];  Service: Endoscopy;;     Current Outpatient Medications  Medication Sig Dispense Refill  . aspirin EC 81 MG tablet Take 81 mg by mouth daily.    Marland Kitchen BLACK CURRANT SEED OIL PO Take 1 capsule by mouth daily.    . blood glucose meter kit and supplies KIT Dispense based on patient and insurance preference. Use up to four times daily as directed. 1 each 0  . carvedilol (COREG) 6.25 MG tablet Take 1 tablet (6.25 mg total) by mouth 2 (two) times daily with a meal. 60 tablet 6  . Menthol, Topical Analgesic, (ABSORBINE JR BACK PATCH EX) Place 1 patch onto the skin daily as needed (pain.).    Marland Kitchen Multiple Vitamin (MULTIVITAMIN WITH MINERALS) TABS tablet Take 1 tablet by mouth daily.     . simvastatin (ZOCOR) 20 MG tablet TAKE 1 TABLET BY MOUTH AT BEDTIME 30 tablet 10  . spironolactone (ALDACTONE) 25 MG tablet TAKE 1/2 (ONE-HALF) TABLET BY MOUTH ONCE DAILY 45 tablet 3  . torsemide (DEMADEX) 20 MG tablet Take 2 tablets (40 mg total) by mouth daily. 60 tablet 11  . zinc gluconate 50 MG tablet Take 50 mg by mouth daily.     Current Facility-Administered Medications  Medication Dose Route Frequency Provider Last Rate Last Dose  . methylPREDNISolone acetate (DEPO-MEDROL) injection 40 mg  40 mg Intra-articular Once Hilts, Michael, MD        Allergies:   Patient has no known allergies.     ROS:  Please see the history of present illness.   Otherwise, review of systems are positive for none.   All other systems are reviewed and negative.    PHYSICAL EXAM: VS:  BP 98/74   Pulse 74   Temp (!) 97.1 F (36.2 C)   Ht 6' (1.829 m)   Wt 223 lb 6.4 oz (101.3 kg)   SpO2 98%   BMI 30.30 kg/m  , BMI Body mass index is 30.3 kg/m. GENERAL:  Well appearing NECK:  No jugular venous distention, waveform within normal limits, carotid upstroke brisk and symmetric, no bruits, no thyromegaly LUNGS:  Clear to auscultation bilaterally BACK:  No CVA tenderness CHEST:  Well healed ICD pocket.  HEART:  PMI not displaced or sustained,S1 and S2 within normal limits, no S3, no S4, no clicks, no rubs, NO murmurs ABD:  Flat, positive bowel sounds normal in frequency in pitch, no bruits, no rebound, no guarding, no midline pulsatile mass, no hepatomegaly, no splenomegaly EXT:  2 plus pulses throughout, no edema, no cyanosis no clubbing   EKG:  EKG is ordered today. The ekg ordered today demonstrates sinus bradycardia, rate 52, axis within normal limits, intervals within normal limits, no acute ST-T wave changes.  No change from previous.   Recent Labs: 11/25/2018: ALT 34; Hemoglobin 13.7; Platelets 142.0; Pro B Natriuretic peptide (BNP) 431.0; TSH 2.17 04/13/2019: BUN 19; Creatinine, Ser 1.41;  Potassium 4.4; Sodium 139    Lipid Panel    Component Value Date/Time   CHOL 175 11/25/2018 1640   TRIG 186.0 (H) 11/25/2018 1640   HDL 45.20 11/25/2018 1640   CHOLHDL 4 11/25/2018 1640   VLDL 37.2 11/25/2018 1640   LDLCALC 93 11/25/2018 1640   LDLDIRECT 166.4 05/17/2010 0905      Wt Readings from Last 3 Encounters:  07/19/19 223 lb 6.4 oz (101.3 kg)  04/20/19 217 lb 6.4 oz (98.6 kg)  04/13/19 218 lb 12.8 oz (99.2 kg)      Other  studies Reviewed: Additional studies/ records that were reviewed today include: Labs. Review of the above records demonstrates:  Please see elsewhere in the note.     Current medicines are reviewed at length with the patient today.  The patient does not have concerns regarding medicines.  The following changes have been made:  no change  Labs/ tests ordered today include: None  Orders Placed This Encounter  Procedures  . EKG 12-Lead    ASSESSMENT AND PLAN:    DOE in patient with non-ischemic cardiomyopathy:   This seems to be baseline.  It is much better with the change in diuretic from Lasix to torsemide.  His labs were okay.  He is can continue with increased activity and see if this improves.  At this point I do not think this is necessarily heart failure related.  ICD:   I did review the latest ICD report and I do not see any evidence of atrial fib.  He does not feel any palpitations.  I could not document atrial fibrillation has been limited needs to be treated.  He is otherwise up-to-date with follow-up.  Of note it was clarified at the last appointment that if he ever needs an MRI of his shoulder this device is compatible.  OSA:   Since he as last seen he did have his CPAP adjusted.  He can continue to have follow-up and adjustment as I think this is important to his fatigue.  He has a 57-monthfollow-up.   S/p CABG:  He is not getting any further chest discomfort consistent with angina.  No change in therapy.    HLD: LDL is 93 with an  HDL 45.  No change in therapy.  Pre-DM:    A1c of 6.8.  Is been managing this with diet and weight loss.  CKD stage 3:  Creatinine was most recently 1.41 which is down from previous.  We will follow this.    Disposition:   FU with me in six months.     Signed, JMinus Breeding MD  07/19/2019 10:26 AM    [facility]

## 2019-07-19 ENCOUNTER — Other Ambulatory Visit: Payer: Self-pay

## 2019-07-19 ENCOUNTER — Ambulatory Visit (INDEPENDENT_AMBULATORY_CARE_PROVIDER_SITE_OTHER): Payer: Medicare HMO | Admitting: Cardiology

## 2019-07-19 ENCOUNTER — Encounter: Payer: Self-pay | Admitting: Cardiology

## 2019-07-19 VITALS — BP 98/74 | HR 74 | Temp 97.1°F | Ht 72.0 in | Wt 223.4 lb

## 2019-07-19 DIAGNOSIS — Z95810 Presence of automatic (implantable) cardiac defibrillator: Secondary | ICD-10-CM | POA: Diagnosis not present

## 2019-07-19 DIAGNOSIS — E785 Hyperlipidemia, unspecified: Secondary | ICD-10-CM | POA: Diagnosis not present

## 2019-07-19 DIAGNOSIS — I428 Other cardiomyopathies: Secondary | ICD-10-CM

## 2019-07-19 DIAGNOSIS — G4731 Primary central sleep apnea: Secondary | ICD-10-CM | POA: Diagnosis not present

## 2019-07-19 NOTE — Patient Instructions (Signed)
Medication Instructions:  Your physician recommends that you continue on your current medications as directed. Please refer to the Current Medication list given to you today.  If you need a refill on your cardiac medications before your next appointment, please call your pharmacy.   Lab work: NONE  Testing/Procedures: NONE  Follow-Up: At [facility], you and your health needs are our priority.  As part of our continuing mission to provide you with exceptional heart care, we have created designated Provider Care Teams.  These Care Teams include your primary Cardiologist (physician) and Advanced Practice Providers (APPs -  Physician Assistants and Nurse Practitioners) who all work together to provide you with the care you need, when you need it. You will need a follow up appointment in 6 months.  Please call our office 2 months in advance to schedule this appointment.  You may see James Hochrein, MD or one of the following Advanced Practice Providers on your designated Care Team:   Rhonda Barrett, PA-C Kathryn Lawrence, DNP, ANP       

## 2019-07-20 ENCOUNTER — Encounter: Payer: Self-pay | Admitting: Cardiology

## 2019-08-01 DIAGNOSIS — G4733 Obstructive sleep apnea (adult) (pediatric): Secondary | ICD-10-CM | POA: Diagnosis not present

## 2019-08-06 ENCOUNTER — Telehealth: Payer: Self-pay | Admitting: *Deleted

## 2019-08-06 NOTE — Telephone Encounter (Signed)
Left message pressure settings have been changed on his sleep machine. Please attempt to use. If any questions and/or concerns please call me back @ [phone].

## 2019-08-31 DIAGNOSIS — G4733 Obstructive sleep apnea (adult) (pediatric): Secondary | ICD-10-CM | POA: Diagnosis not present

## 2019-09-21 ENCOUNTER — Encounter: Payer: Medicare HMO | Admitting: *Deleted

## 2019-09-23 ENCOUNTER — Telehealth: Payer: Self-pay

## 2019-09-23 NOTE — Telephone Encounter (Signed)
Unable to speak  with patient to remind of missed remote transmission 

## 2019-10-01 DIAGNOSIS — G4733 Obstructive sleep apnea (adult) (pediatric): Secondary | ICD-10-CM | POA: Diagnosis not present

## 2019-10-06 ENCOUNTER — Other Ambulatory Visit: Payer: Self-pay

## 2019-10-06 MED ORDER — SPIRONOLACTONE 25 MG PO TABS: ORAL_TABLET | ORAL | 3 refills | Status: DC

## 2019-10-06 MED ORDER — TORSEMIDE 20 MG PO TABS: 40.0000 mg | ORAL_TABLET | Freq: Every day | ORAL | 11 refills | Status: DC

## 2019-10-06 MED ORDER — SIMVASTATIN 20 MG PO TABS: 20.0000 mg | ORAL_TABLET | Freq: Every day | ORAL | 10 refills | Status: DC

## 2019-10-06 MED ORDER — CARVEDILOL 6.25 MG PO TABS: 6.2500 mg | ORAL_TABLET | Freq: Two times a day (BID) | ORAL | 6 refills | Status: DC

## 2019-10-11 ENCOUNTER — Other Ambulatory Visit: Payer: Self-pay

## 2019-10-11 MED ORDER — TORSEMIDE 20 MG PO TABS: 40.0000 mg | ORAL_TABLET | Freq: Every day | ORAL | 6 refills | Status: DC

## 2019-10-11 MED ORDER — CARVEDILOL 6.25 MG PO TABS: 6.2500 mg | ORAL_TABLET | Freq: Two times a day (BID) | ORAL | 6 refills | Status: DC

## 2019-10-14 DIAGNOSIS — H524 Presbyopia: Secondary | ICD-10-CM | POA: Diagnosis not present

## 2019-10-20 ENCOUNTER — Other Ambulatory Visit: Payer: Self-pay

## 2019-10-21 ENCOUNTER — Other Ambulatory Visit: Payer: Self-pay

## 2019-11-09 ENCOUNTER — Telehealth: Payer: Self-pay | Admitting: Cardiology

## 2019-11-09 NOTE — Telephone Encounter (Signed)
  1. Has your device fired? No  2. Is you device beeping? Yes  3. Are you experiencing draining or swelling at device site? No  4. Are you calling to see if we received your device transmission? Yes  5. Have you passed out? No  Patient is calling stating when he tries to send readings the numbers "3230" come up with a "?" then the screen goes red requesting him to do it again. He states this has been happening the last few times he's tried to send it in.

## 2019-11-10 NOTE — Telephone Encounter (Signed)
LMOM  To call office for monitor assistance. Device clinic contact # provided.

## 2019-11-11 ENCOUNTER — Telehealth: Payer: Self-pay | Admitting: Cardiology

## 2019-11-11 NOTE — Telephone Encounter (Signed)
Pt states he has disability paperwork he would like Dr. [name] to complete. He states paperwork is part of student loan forgiveness program for his son and that he has a chance to have the debt forgiven d/t being disabled. He states disability is cardiac-related. Advised pt to bring form to the office and if Dr. [name] is able/willing to complete form then it can be completed. Pt would like to bring form to the office tomorrow

## 2019-11-11 NOTE — Telephone Encounter (Signed)
New Message:      Pt wants to know if Dr [name] will fill out some papers concerning a Disability Program that he needs.

## 2019-11-11 NOTE — Telephone Encounter (Signed)
Pt was first given the error code 3230 and then given 3248. I conference called [employer] with the pt and they tried to trouble shoot the monitor. The pt is receiving a new monitor in 7-10 business days.

## 2019-11-15 NOTE — Telephone Encounter (Signed)
Papers received and waiting for Dr [name] to address.

## 2019-11-15 NOTE — Telephone Encounter (Signed)
Patient states he dropped off this paper work on Friday 11/12/19. He is following up to make sure that Dr. [name] received it.

## 2019-11-15 NOTE — Telephone Encounter (Signed)
Advised patient

## 2019-11-18 ENCOUNTER — Ambulatory Visit (INDEPENDENT_AMBULATORY_CARE_PROVIDER_SITE_OTHER): Payer: Medicare HMO | Admitting: *Deleted

## 2019-11-18 DIAGNOSIS — I428 Other cardiomyopathies: Secondary | ICD-10-CM

## 2019-11-18 LAB — CUP PACEART REMOTE DEVICE CHECK
Battery Remaining Longevity: 117 mo
Battery Voltage: 3 V
Brady Statistic RV Percent Paced: 0.42 %
Date Time Interrogation Session: 20210113180941
HighPow Impedance: 62 Ohm
Implantable Lead Implant Date: 20180517
Implantable Lead Location: 753860
Implantable Pulse Generator Implant Date: 20180517
Lead Channel Impedance Value: 342 Ohm
Lead Channel Impedance Value: 418 Ohm
Lead Channel Pacing Threshold Amplitude: 0.625 V
Lead Channel Pacing Threshold Pulse Width: 0.4 ms
Lead Channel Sensing Intrinsic Amplitude: 12.625 mV
Lead Channel Sensing Intrinsic Amplitude: 12.625 mV
Lead Channel Setting Pacing Amplitude: 2.5 V
Lead Channel Setting Pacing Pulse Width: 0.4 ms
Lead Channel Setting Sensing Sensitivity: 0.3 mV

## 2019-11-22 NOTE — Telephone Encounter (Signed)
Patient calling to check on status of paperwork  

## 2019-11-24 NOTE — Telephone Encounter (Signed)
  Patient states that he dropped off a form about a week ago for Dr [name] to fill out regarding his disability. He has called to check on the paperwork and is upset no one has called him back. He would like an update on the status.

## 2019-11-25 ENCOUNTER — Ambulatory Visit: Payer: Medicare HMO | Admitting: Cardiology

## 2019-11-25 NOTE — Telephone Encounter (Signed)
Patient has an appt on 11/26/19 to discuss forms with Dr. [name].

## 2019-11-25 NOTE — Progress Notes (Signed)
Cardiology Office Note   Date:  11/26/2019   ID:  Bruce Robertson, DOB Apr 28, 1961, MRN [id number]  PCP:  Caren Macadam, MD  Cardiologist:   Minus Breeding, MD   Chief Complaint  Patient presents with  . Shortness of Breath      History of Present Illness: Bruce Robertson is a 59 y.o. male who presents for follow up of a non-ischemic cardiomyopathy (EF initially 40%, improved to 45%, then dropped to 20-25%), s/p CABG in 2007 due to diagnostic cath complicated by LM dissection, s/p ICD for primary prevention.  He presents for follow up.  He has a disability form that you would like to have filled out.  He has been more short of breath recently.  This is episodic but most days he short of breath.  He describes this with activity such as walking up an incline or climbing stairs.  He is not describing classic PND or orthopnea.  He does have sleep apnea and uses CPAP but he has not really been able to tolerate his.  He is not describing chest pressure, neck or arm discomfort.  There has been no new palpitations, presyncope or syncope.   Past Medical History:  Diagnosis Date  . AICD (automatic cardioverter/defibrillator) present   . Anxiety   . Cardiomyopathy    Nonischemic. EF has been about 45%. S/P CABG.;  b.  Echo 4/14: EF 25%, global HK with inf and mid apical AK, restrictive physiology with E/e' > 15 (elevated LV filling pressure), trivial AI/MR, mild to mod LAE, mild RVE, mild reduced RVSF, mild RAE, mod TR, PASP 74 (severe pulmonary HTN)   . CHF (congestive heart failure) ([place])   . CHF exacerbation ([place]) 07/31/2016  . Colon polyps 2002  . Coronary artery disease   . Depression   . Dyslipidemia    hx (03/20/2017)  . GERD (gastroesophageal reflux disease)   . History of bleeding peptic ulcer   . History of echocardiogram    Echo 10/16:  EF 25-30%, poss non-compaction, diff HK with inf-lat and apical HK, restrictive physio, mild AI, severe LAE, mild RVE with mild reduced  RVSF, PASP 42 mmHg  . Hyperplastic rectal polyp   . OSA on CPAP    Mild  . Tobacco abuse    Remote    Past Surgical History:  Procedure Laterality Date  . CARDIAC CATHETERIZATION  2007;  2009  . CARDIAC DEFIBRILLATOR PLACEMENT  03/20/2017  . COLONOSCOPY WITH PROPOFOL N/A 01/21/2019   Procedure: COLONOSCOPY WITH PROPOFOL;  Surgeon: Milus Banister, MD;  Location: [facility];  Service: Endoscopy;  Laterality: N/A;  . CORONARY ARTERY BYPASS GRAFT  2007   Had a left main dissection after catheterization. Underwent a saphenous vein graft to the LAD and a saphenous vein graft to obtuse marginal.  . ICD IMPLANT N/A 03/20/2017   Procedure: ICD Implant;  Surgeon: Constance Haw, MD;  Location: [facility];  Service: Cardiovascular;  Laterality: N/A;  . PATELLAR TENDON REPAIR Right 2005  . POLYPECTOMY  01/21/2019   Procedure: POLYPECTOMY;  Surgeon: Milus Banister, MD;  Location: [facility];  Service: Endoscopy;;     Current Outpatient Medications  Medication Sig Dispense Refill  . aspirin EC 81 MG tablet Take 81 mg by mouth daily.    Marland Kitchen BLACK CURRANT SEED OIL PO Take 1 capsule by mouth daily.    . blood glucose meter kit and supplies KIT Dispense based on patient and insurance preference. Use up  to four times daily as directed. 1 each 0  . carvedilol (COREG) 12.5 MG tablet Take 1 tablet (12.5 mg total) by mouth 2 (two) times daily. 60 tablet 11  . Menthol, Topical Analgesic, (ABSORBINE JR BACK PATCH EX) Place 1 patch onto the skin daily as needed (pain.).    Marland Kitchen Multiple Vitamin (MULTIVITAMIN WITH MINERALS) TABS tablet Take 1 tablet by mouth daily.    . simvastatin (ZOCOR) 20 MG tablet Take 1 tablet (20 mg total) by mouth at bedtime. 30 tablet 10  . spironolactone (ALDACTONE) 25 MG tablet TAKE 1/2 (ONE-HALF) TABLET BY MOUTH ONCE DAILY 45 tablet 3  . torsemide (DEMADEX) 20 MG tablet Take 2 tablets (40 mg total) by mouth daily. 60 tablet 6  . zinc gluconate 50 MG tablet Take 50  mg by mouth daily.     Current Facility-Administered Medications  Medication Dose Route Frequency Provider Last Rate Last Admin  . methylPREDNISolone acetate (DEPO-MEDROL) injection 40 mg  40 mg Intra-articular Once Hilts, Michael, MD        Allergies:   Patient has no known allergies.     ROS:  Please see the history of present illness.   Otherwise, review of systems are positive for none.   All other systems are reviewed and negative.    PHYSICAL EXAM: VS:  BP 130/80   Pulse 82   Temp 97.9 F (36.6 C)   Ht 6' 1"  (1.854 m)   Wt 232 lb (105.2 kg)   BMI 30.61 kg/m  , BMI Body mass index is 30.61 kg/m. GENERAL:  Well appearing NECK:  No jugular venous distention, waveform within normal limits, carotid upstroke brisk and symmetric, no bruits, no thyromegaly LUNGS:  Clear to auscultation bilaterally CHEST: Well-healed ICD pocket HEART:  PMI not displaced or sustained,S1 and S2 within normal limits, no S3, no S4, no clicks, no rubs, no murmurs ABD:  Flat, positive bowel sounds normal in frequency in pitch, no bruits, no rebound, no guarding, no midline pulsatile mass, no hepatomegaly, no splenomegaly EXT:  2 plus pulses throughout, no edema, no cyanosis no clubbing   EKG:  EKG is not ordered today.    Recent Labs: 04/13/2019: BUN 19; Creatinine, Ser 1.41; Potassium 4.4; Sodium 139    Lipid Panel    Component Value Date/Time   CHOL 175 11/25/2018 1640   TRIG 186.0 (H) 11/25/2018 1640   HDL 45.20 11/25/2018 1640   CHOLHDL 4 11/25/2018 1640   VLDL 37.2 11/25/2018 1640   LDLCALC 93 11/25/2018 1640   LDLDIRECT 166.4 05/17/2010 0905      Wt Readings from Last 3 Encounters:  11/26/19 232 lb (105.2 kg)  07/19/19 223 lb 6.4 oz (101.3 kg)  04/20/19 217 lb 6.4 oz (98.6 kg)      Other studies Reviewed: Additional studies/ records that were reviewed today include: Device interrogation Review of the above records demonstrates:  Please see elsewhere in the note.      Current medicines are reviewed at length with the patient today.  The patient does not have concerns regarding medicines.  The following changes have been made:  NA  Labs/ tests ordered today include:   Orders Placed This Encounter  Procedures  . B Nat Peptide  . Basic Metabolic Panel (BMET)    ASSESSMENT AND PLAN:    DOE in patient with non-ischemic cardiomyopathy:    He does have some increased shortness of breath.  I filled out a disability form today that he is going to  use for long forgiveness.  He does seem to have worsening symptoms.  I would like to get a BNP as well as a basic metabolic profile.  I am not anticipating that I will be able to increase his medicines much because he has not tolerated this from a blood pressure standpoint previously.  We talked about salt and fluid restriction.  He has gained weight but it does not seem to necessarily be fluid.  We talked about losing weight through diet.  He is instructed to increase his carvedilol because his device interrogation demonstrated some nonsustained ventricular tachycardia.  We will see if he tolerates this and then try again in the future to add at least a low-dose ARB which we would have to do carefully because of his creatinine and his low blood pressures previously.   ICD:  I did review his device interrogation.  He has follow-up with EP next week.   OSA:     S/p CABG:  Is not describing any chest pain.  No further ischemia work-up.  HLD: LDL is 93.  HDL 45.  No change in therapy.   Pre-DM:    A1c was 6.8 in April and he should have follow-up of this soon.  I will defer to Caren Macadam, MD   CKD stage 3:  Creatinine was most recently I will check a basic metabolic profile as above.    Signed, Minus Breeding, MD  11/26/2019 5:47 PM    [place]

## 2019-11-26 ENCOUNTER — Ambulatory Visit (INDEPENDENT_AMBULATORY_CARE_PROVIDER_SITE_OTHER): Payer: Medicare HMO | Admitting: Cardiology

## 2019-11-26 ENCOUNTER — Other Ambulatory Visit: Payer: Self-pay

## 2019-11-26 ENCOUNTER — Encounter: Payer: Self-pay | Admitting: Cardiology

## 2019-11-26 ENCOUNTER — Other Ambulatory Visit: Payer: Self-pay | Admitting: *Deleted

## 2019-11-26 VITALS — BP 130/80 | HR 82 | Temp 97.9°F | Ht 73.0 in | Wt 232.0 lb

## 2019-11-26 DIAGNOSIS — R0602 Shortness of breath: Secondary | ICD-10-CM

## 2019-11-26 DIAGNOSIS — I428 Other cardiomyopathies: Secondary | ICD-10-CM

## 2019-11-26 MED ORDER — CARVEDILOL 12.5 MG PO TABS: 12.5000 mg | ORAL_TABLET | Freq: Two times a day (BID) | ORAL | 11 refills | Status: DC

## 2019-11-26 NOTE — Progress Notes (Signed)
Bruce Haw, MD  Stanton Kidney, RN  Abnormal device interrogation reviewed. Lead parameters and battery status stable. NSVT. Increase coreg to 12.5 mg BID.  Pt is aware of the above information

## 2019-11-26 NOTE — Patient Instructions (Signed)
Medication Instructions:  No Changes *If you need a refill on your cardiac medications before your next appointment, please call your pharmacy*  Lab Work: Your physician recommends that you return for lab work next week (BMP, BNP)  If you have labs (blood work) drawn today and your tests are completely normal, you will receive your results only by: Marland Kitchen MyChart Message (if you have MyChart) OR . A paper copy in the mail If you have any lab test that is abnormal or we need to change your treatment, we will call you to review the results.  Testing/Procedures: None  Follow-Up: At [facility], you and your health needs are our priority.  As part of our continuing mission to provide you with exceptional heart care, we have created designated Provider Care Teams.  These Care Teams include your primary Cardiologist (physician) and Advanced Practice Providers (APPs -  Physician Assistants and Nurse Practitioners) who all work together to provide you with the care you need, when you need it.  Your next appointment:   6 week(s)  The format for your next appointment:   In Person  Provider:   Dr. [name]  Other Instructions New patient appt with Dr. Shelva Majestic for your sleep machine

## 2019-11-29 DIAGNOSIS — I428 Other cardiomyopathies: Secondary | ICD-10-CM | POA: Diagnosis not present

## 2019-11-29 DIAGNOSIS — R0602 Shortness of breath: Secondary | ICD-10-CM | POA: Diagnosis not present

## 2019-11-30 ENCOUNTER — Ambulatory Visit: Payer: Medicare HMO | Admitting: Cardiology

## 2019-11-30 ENCOUNTER — Other Ambulatory Visit: Payer: Self-pay

## 2019-11-30 ENCOUNTER — Encounter: Payer: Self-pay | Admitting: Cardiology

## 2019-11-30 VITALS — BP 90/54 | HR 58 | Ht 73.0 in | Wt 229.6 lb

## 2019-11-30 DIAGNOSIS — I428 Other cardiomyopathies: Secondary | ICD-10-CM

## 2019-11-30 LAB — BASIC METABOLIC PANEL
BUN/Creatinine Ratio: 12 (ref 9–20)
BUN: 18 mg/dL (ref 6–24)
CO2: 21 mmol/L (ref 20–29)
Calcium: 9.5 mg/dL (ref 8.7–10.2)
Chloride: 99 mmol/L (ref 96–106)
Creatinine, Ser: 1.53 mg/dL — ABNORMAL HIGH (ref 0.76–1.27)
GFR calc Af Amer: 58 mL/min/{1.73_m2} — ABNORMAL LOW (ref >59)
GFR calc non Af Amer: 50 mL/min/{1.73_m2} — ABNORMAL LOW (ref >59)
Glucose: 153 mg/dL — ABNORMAL HIGH (ref 65–99)
Potassium: 4.3 mmol/L (ref 3.5–5.2)
Sodium: 140 mmol/L (ref 134–144)

## 2019-11-30 LAB — BRAIN NATRIURETIC PEPTIDE: BNP: 156.1 pg/mL — ABNORMAL HIGH (ref 0.0–100.0)

## 2019-11-30 NOTE — Progress Notes (Signed)
Electrophysiology Office Note   Date:  11/30/2019   ID:  Yochanan Eddleman, DOB 1961/01/13, MRN [id number]  PCP:  Caren Macadam, MD  Cardiologist:  [place] Primary Electrophysiologist:  Placido Hangartner [name], MD    No chief complaint on file.    History of Present Illness: Bruce Robertson is a 59 y.o. male who is being seen today for the evaluation of nonischemic cardiomyopathy at the request of [name], MD. Presenting today for electrophysiology evaluation. Repeat echo showed a stable ejection fraction of 25%. Had a Medtronic ICD implanted on 03/20/17.  Today, denies symptoms of palpitations, chest pain, shortness of breath, orthopnea, PND, lower extremity edema, claudication, dizziness, presyncope, syncope, bleeding, or neurologic sequela. The patient is tolerating medications without difficulties.  Overall he is doing well.  He has no chest pain or shortness of breath.  He does have some bad days which she attributes to having too much salt.  He says that he is become quite sensitive.  His carvedilol dose was recently increased which has improved his overall symptomatology and give him more energy.   Past Medical History:  Diagnosis Date  . AICD (automatic cardioverter/defibrillator) present   . Anxiety   . Cardiomyopathy    Nonischemic. EF has been about 45%. S/P CABG.;  b.  Echo 4/14: EF 25%, global HK with inf and mid apical AK, restrictive physiology with E/e' > 15 (elevated LV filling pressure), trivial AI/MR, mild to mod LAE, mild RVE, mild reduced RVSF, mild RAE, mod TR, PASP 74 (severe pulmonary HTN)   . CHF (congestive heart failure) ([place])   . CHF exacerbation ([place]) 07/31/2016  . Colon polyps 2002  . Coronary artery disease   . Depression   . Dyslipidemia    hx (03/20/2017)  . GERD (gastroesophageal reflux disease)   . History of bleeding peptic ulcer   . History of echocardiogram    Echo 10/16:  EF 25-30%, poss non-compaction, diff HK with inf-lat and apical  HK, restrictive physio, mild AI, severe LAE, mild RVE with mild reduced RVSF, PASP 42 mmHg  . Hyperplastic rectal polyp   . OSA on CPAP    Mild  . Tobacco abuse    Remote   Past Surgical History:  Procedure Laterality Date  . CARDIAC CATHETERIZATION  2007;  2009  . CARDIAC DEFIBRILLATOR PLACEMENT  03/20/2017  . COLONOSCOPY WITH PROPOFOL N/A 01/21/2019   Procedure: COLONOSCOPY WITH PROPOFOL;  Surgeon: Milus Banister, MD;  Location: [facility];  Service: Endoscopy;  Laterality: N/A;  . CORONARY ARTERY BYPASS GRAFT  2007   Had a left main dissection after catheterization. Underwent a saphenous vein graft to the LAD and a saphenous vein graft to obtuse marginal.  . ICD IMPLANT N/A 03/20/2017   Procedure: ICD Implant;  Surgeon: Constance Haw, MD;  Location: [facility];  Service: Cardiovascular;  Laterality: N/A;  . PATELLAR TENDON REPAIR Right 2005  . POLYPECTOMY  01/21/2019   Procedure: POLYPECTOMY;  Surgeon: Milus Banister, MD;  Location: [facility];  Service: Endoscopy;;     Current Outpatient Medications  Medication Sig Dispense Refill  . aspirin EC 81 MG tablet Take 81 mg by mouth daily.    Marland Kitchen BLACK CURRANT SEED OIL PO Take 1 capsule by mouth daily.    . blood glucose meter kit and supplies KIT Dispense based on patient and insurance preference. Use up to four times daily as directed. 1 each 0  . carvedilol (COREG) 12.5 MG tablet Take  1 tablet (12.5 mg total) by mouth 2 (two) times daily. 60 tablet 11  . Menthol, Topical Analgesic, (ABSORBINE JR BACK PATCH EX) Place 1 patch onto the skin daily as needed (pain.).    Marland Kitchen Multiple Vitamin (MULTIVITAMIN WITH MINERALS) TABS tablet Take 1 tablet by mouth daily.    . simvastatin (ZOCOR) 20 MG tablet Take 1 tablet (20 mg total) by mouth at bedtime. 30 tablet 10  . spironolactone (ALDACTONE) 25 MG tablet TAKE 1/2 (ONE-HALF) TABLET BY MOUTH ONCE DAILY 45 tablet 3  . torsemide (DEMADEX) 20 MG tablet Take 2 tablets (40 mg total)  by mouth daily. 60 tablet 6  . zinc gluconate 50 MG tablet Take 50 mg by mouth daily.     Current Facility-Administered Medications  Medication Dose Route Frequency Provider Last Rate Last Admin  . methylPREDNISolone acetate (DEPO-MEDROL) injection 40 mg  40 mg Intra-articular Once Hilts, Michael, MD        Allergies:   Patient has no known allergies.   Social History:  The patient  reports that he has quit smoking. His smoking use included cigarettes. He has a 0.70 pack-year smoking history. He has never used smokeless tobacco. He reports current alcohol use. He reports current drug use. Drug: Marijuana.   Family History:  The patient's family history includes Bipolar disorder in his daughter; Cancer in his maternal grandmother and maternal uncle; Diabetes in his daughter and niece; Heart failure (age of onset: 51) in his father; High blood pressure in his father; Hypertension in his mother; Lung cancer in an other family member; Osteoarthritis in his mother.    ROS:  Please see the history of present illness.   Otherwise, review of systems is positive for none.   All other systems are reviewed and negative.   PHYSICAL EXAM: VS:  BP (!) 90/54   Pulse (!) 58   Ht 6' 1"  (1.854 m)   Wt 229 lb 9.6 oz (104.1 kg)   SpO2 98%   BMI 30.29 kg/m  , BMI Body mass index is 30.29 kg/m. GEN: Well nourished, well developed, in no acute distress  HEENT: normal  Neck: no JVD, carotid bruits, or masses Cardiac: RRR; no murmurs, rubs, or gallops,no edema  Respiratory:  clear to auscultation bilaterally, normal work of breathing GI: soft, nontender, nondistended, + BS MS: no deformity or atrophy  Skin: warm and dry, device site well healed Neuro:  Strength and sensation are intact Psych: euthymic mood, full affect  EKG:  EKG is not ordered today. Personal review of the ekg ordered 07/19/19 shows sinus rhythm, rate 52, lateral T wave inversions  Personal review of the device interrogation today.  Results in [place]: 11/29/2019: BNP Bruce Robertson FOLLOW; BUN 18; Creatinine, Ser 1.53; Potassium 4.3; Sodium 140    Lipid Panel     Component Value Date/Time   CHOL 175 11/25/2018 1640   TRIG 186.0 (H) 11/25/2018 1640   HDL 45.20 11/25/2018 1640   CHOLHDL 4 11/25/2018 1640   VLDL 37.2 11/25/2018 1640   LDLCALC 93 11/25/2018 1640   LDLDIRECT 166.4 05/17/2010 0905     Wt Readings from Last 3 Encounters:  11/30/19 229 lb 9.6 oz (104.1 kg)  11/26/19 232 lb (105.2 kg)  07/19/19 223 lb 6.4 oz (101.3 kg)      Other studies Reviewed: Additional studies/ records that were reviewed today include: TTE 10/15/16  Review of the above records today demonstrates:  - Left ventricle: The cavity size was severely  dilated. Wall   thickness was increased in a pattern of mild LVH. Systolic   function was severely reduced. The estimated ejection fraction   was in the range of 20% to 25%. Diffuse hypokinesis. There is   akinesis of the inferolateral and inferior myocardium. Doppler   parameters are consistent with restrictive physiology, indicative   of decreased left ventricular diastolic compliance and/or   increased left atrial pressure. - Aortic valve: There was trivial regurgitation. - Mitral valve: There was mild regurgitation. - Left atrium: The atrium was moderately to severely dilated. - Right atrium: The atrium was mildly dilated. - Pulmonary arteries: Systolic pressure was moderately increased.   PA peak pressure: 46 mm Hg (S).  ASSESSMENT AND PLAN:  1.  Nonischemic cardiomyopathy: Currently on Coreg and losartan. Status post Medtronic single-chamber ICD implanted 03/20/2017. Device functioning appropriately.  No changes at this time.  He did have some nonsustained VT and his carvedilol dose was increased at that time.  2. Sleep apnea: Compliant with CPAP   Current medicines are reviewed at length with the patient today.   The patient does not have concerns regarding his  medicines.  The following changes were made today: None  Labs/ tests ordered today include:  No orders of the defined types were placed in this encounter.    Disposition:   FU with Bruce Robertson 12 months  Signed, Marissah Vandemark [name], MD  11/30/2019 11:43 AM     [facility] [address] [place] [address] [phone] (office) [phone] (fax)

## 2019-12-07 ENCOUNTER — Other Ambulatory Visit: Payer: Self-pay | Admitting: Cardiology

## 2019-12-07 MED ORDER — SPIRONOLACTONE 25 MG PO TABS: ORAL_TABLET | ORAL | 2 refills | Status: DC

## 2019-12-07 NOTE — Telephone Encounter (Signed)
*  STAT* If patient is at the pharmacy, call can be transferred to refill team.   1. Which medications need to be refilled? (please list name of each medication and dose if known)  spironolactone (ALDACTONE) 25 MG tablet  2. Which pharmacy/location (including street and city if local pharmacy) is medication to be sent to? [facility] ([place]), [place] [address]  3. Do they need a 30 day or 90 day supply? 90  Patient says he has been trying to refill this medication for the past week , but the pharmacy needs approval from the office. The patient is out of medicine and has not taken this medication for the past week

## 2019-12-09 DIAGNOSIS — Z20828 Contact with and (suspected) exposure to other viral communicable diseases: Secondary | ICD-10-CM | POA: Diagnosis not present

## 2019-12-17 ENCOUNTER — Telehealth: Payer: Self-pay | Admitting: Student

## 2019-12-17 NOTE — Telephone Encounter (Signed)
  Carelink alert for NSVT (known) and possible AF with length of 1.22 hours (82 minutes).  Has had episodes before that were 6-10 minutes in length and difficult to assess fully.     NOT on [place].   Will forward to Dr. Curt Bears for recommendation vs starting [facility] or watchful waiting.   This patients CHA2DS2-VASc Score and unadjusted Ischemic Stroke Rate (% per year) is as least  2.2 % stroke rate/year from a score of 2 (CHF, HTN)   Above score calculated as 1 point each if present [CHF, HTN, DM, Vascular=MI/PAD/Aortic Plaque, Age if 65-74, or Male] Above score calculated as 2 points each if present [Age > 75, or Stroke/TIA/TE]

## 2019-12-17 NOTE — Telephone Encounter (Signed)
Appears AF. Will need anticoagulation if longer episodes but continue to monitor.

## 2020-01-10 ENCOUNTER — Ambulatory Visit: Payer: Medicare HMO | Admitting: Cardiology

## 2020-01-20 ENCOUNTER — Ambulatory Visit (INDEPENDENT_AMBULATORY_CARE_PROVIDER_SITE_OTHER): Payer: Medicare HMO | Admitting: Cardiovascular Disease

## 2020-01-20 ENCOUNTER — Encounter: Payer: Self-pay | Admitting: Cardiovascular Disease

## 2020-01-20 ENCOUNTER — Other Ambulatory Visit: Payer: Self-pay

## 2020-01-20 DIAGNOSIS — I428 Other cardiomyopathies: Secondary | ICD-10-CM | POA: Diagnosis not present

## 2020-01-20 DIAGNOSIS — G4731 Primary central sleep apnea: Secondary | ICD-10-CM | POA: Diagnosis not present

## 2020-01-20 DIAGNOSIS — R0602 Shortness of breath: Secondary | ICD-10-CM | POA: Diagnosis not present

## 2020-01-20 DIAGNOSIS — G4719 Other hypersomnia: Secondary | ICD-10-CM

## 2020-01-20 DIAGNOSIS — I420 Dilated cardiomyopathy: Secondary | ICD-10-CM

## 2020-01-20 DIAGNOSIS — Z95810 Presence of automatic (implantable) cardiac defibrillator: Secondary | ICD-10-CM | POA: Diagnosis not present

## 2020-01-20 NOTE — Progress Notes (Signed)
Bruce Robertson   Mar 19, 1961  [id number]  Primary Physician No PCP Per Patient Primary Cardiologist: Dr [name]; Charlcie Cradle)  HPI: Hector Taft is a 59 y.o. male who presents for f/u  sleep evaluation.  I had seen him in September 2017 for initial evaluation following institution of CPAP therapy.  He subsequently underwent BiPAP titration.  I last saw him in June 2020.  He presents for follow-up evaluation.   Mr. Tabbert has a history of a nonischemic cardiomyopathy.  He had issues with significant daytime sleepiness, fatigability, and snoring, and was referred for a sleep study which on 03/10/2016 in a split-night protocol.  This confirmed moderate obstructive sleep apnea overall with an AHI of 26.7.  However, the events were severe supine position with an AHI of 43.3.   Oxygen desaturated  to a nadir of 88%.  There was evidence for soft snoring.  He initially was titrated up to 9 cm water pressure.  His set up date was 05/06/2016.  A download from 06/03/2016 through 07/02/2016 demonstrated compliance with reference to days of usage being 77%.  However, he was noncompliant with usage greater than 4 hours, such that he had only 43% of days reaching this threshold.  His average usage on days used was 4 hours and 24 minutes.  His AHI was 6.3. When I saw him for initial evaluation he was still sleepy and at times did not feel as if he was getting adequate air.  With his positional component with severe sleep apnea I changed him to an auto modewith supine sleep which would allow for increased pressures with supine posture adjustment  When I initially saw him he still had residual daytime sleepiness as assessed by his Epworth sleepiness scale score of 11, which is outlined below.   Epworth Sleepiness Scale: Situation   Chance of Dozing/Sleeping (0 = never , 1 = slight chance , 2 = moderate chance , 3 = high chance )   sitting and reading 2   watching TV 2   sitting inactive in a public place  1   being a passenger in a motor vehicle for an hour or more 2   lying down in the afternoon 3   sitting and talking to someone 0   sitting quietly after lunch (no alcohol) 1   while stopped for a few minutes in traffic as the driver 0   Total Score  11   He  developed worsening heart function and EF had dropped to 20 to 25%.  His medications were titrated and he underwent ICD implantation.  Apparently, he is only been intermittently using CPAP.  In August 2019 he was referred back to sleep clinic for further evaluation.  An Epworth Sleepiness Scale score was calculated which endorsed at 16, as shown below.   Epworth Sleepiness Scale: Situation   Chance of Dozing/Sleeping (0 = never , 1 = slight chance , 2 = moderate chance , 3 = high chance )   sitting and reading 3   watching TV 3   sitting inactive in a public place 2   being a passenger in a motor vehicle for an hour or more 3   lying down in the afternoon 3   sitting and talking to someone 0   sitting quietly after lunch (no alcohol) 1   while stopped for a few minutes in traffic as the driver 1   Total Score  16   A new download was obtained in the  office  from December 29, 2017 through January 27, 2018.  He was noncompliant.  Usage days was only 16 out of 30 (53%) and usage greater than 4 hours was only 8 days (27%).  AHI is was 23 and is consistent with moderate sleep apnea.  Apnea index was 21.5 with an hypopnea index of 1.5.  Central apnea index was 10.9/h. When I saw him on 01/29/2018 for evaluation, had a long discussion with him regarding the importance of therapy.  I was concerned that with his reduced LV function.  If his volume status is elevated, he may be developing central apneic events.  In addition to obstructive events.  He underwent a BiPAP titration study on 03/10/2018.  Sleep efficiency was 79.7%.  This was a suboptimal titration.  BiPAP was implemented at 8 over 4, and he was titrated up to 17/13.  At low level  pressures, he had rare central events, but as bilevel pressure increased above 12/8, there was significant increase in the number of central apneic events.  His AHI at 10/6 was 0 with an RDI of 10.7, but he only had 11 minutes of sleep.  At 12/8.  AHI was 7.7, RDI 23.2.  He did not have any central events and had 4 red hours.  Oxygen saturation was 93 and 94% during non-REM and REM  Sleep. AHI was significantly worse at higher BiPAP pressures.  I saw him in May 2019.  At time I reviewed his BiPAP titration.  Based on his LV function presently I cannot initiate ASV therapy which would be most likely beneficial for him.  In the Serve heart failure trial, which used an old ResMed ASV unit, there was  deleterious potential outcome.  However, this study was significantly flawed.  A new study with the Respironics ASV therapy has been underway and may be promising for ultimate changing him to ASV titration.  I had seen him, I discussed initiation of Entresto but due to his blood pressure being low is unable to initiate this therapy.  A new download was obtained from June 01, 2018 through June 30, 2018.  He currently has been set on a minimum EPAP pressure of 4 with a maximum IPAP pressure of 12 with a pressure support of 4.  AHI remains elevated at 29 with an apnea index of 28.  He also had a central apnea index of 18.2.  During this.  He had been hospitalized for several days with heart failure at the beginning of August which may have contributed to his increased central apnea due to increased volume.  During his hospitalization he lost over 92 pounds of fluid.  When I saw him during that evaluation I recommend adjustment to his BiPAP therapy.  Last saw him in June 2020 at which time Mr. Litsey continued to have issues with heart failure and recently his diuretic regimen was adjusted leading to significant additional diuresis and improved breathing.  Prior to the change of his diuretic regimen, he did not feel  he could use the BiPAP and his compliance was poor.  With the recent change of his diuretic regimen with torsemide 40 mg in addition to his Spironolactone 12.5 mg twice a day as well as carvedilol 6.25 mg twice a day for the last several weeks he believes he is breathing better and has again started to use his BiPAP therapy.  He believes subjectively that his sleeping is improved.  A download was obtained from Mar 20, 2019 through April 18, 2019 which encompasses the period of time where he was not compliant.  As result usage days was only 37% with usage greater than 4 hours at only 23%.  Unfortunately, it appears that the changes that were recommended last year last year may not taken.  As result he continues to be on a minimum EPAP pressure of 8 with a maximum IPAP pressure of 12 and pressure support of 4.  AHI remains elevated at 26.8 predominantly due to central events.  He typically goes to bed between 10 and 11 and wakes up between 7 or 8.  His last echo Doppler study was in May 2019 which showed an EF of 20 to 25%.  An Epworth scale was recalculated in the office today and this endorsed at 14 consistent with residual daytime sleepiness.  Since I last saw him, Mr. Klaus saw Dr. [name] in January 2021.  Over the past several months he states that his shortness of breath is getting significantly worse.  He has frequent panic attacks and when these occur takes off his BiPAP unit.  He admits to increasing exertional dyspnea.  He is status post ICD implantation approximately 1 year ago.  He has only intermittently been using his BiPAP therapy due to his increasing shortness of breath.  He presents for reevaluation.  A new Epworth Sleepiness Scale score was calculated in the office today and this endorsed at 20 consistent with significant residual daytime sleepiness.   Epworth Sleepiness Scale: Situation   Chance of Dozing/Sleeping (0 = never , 1 = slight chance , 2 = moderate chance , 3 = high chance )     sitting and reading 3   watching TV 3   sitting inactive in a public place 3   being a passenger in a motor vehicle for an hour or more 3   lying down in the afternoon 3   sitting and talking to someone 1   sitting quietly after lunch (no alcohol) 2   while stopped for a few minutes in traffic as the driver 2   Total Score  20   He presents for follow-up evaluation.   Past Medical History:  Diagnosis Date  . AICD (automatic cardioverter/defibrillator) present   . Anxiety   . Cardiomyopathy    Nonischemic. EF has been about 45%. S/P CABG.;  b.  Echo 4/14: EF 25%, global HK with inf and mid apical AK, restrictive physiology with E/e' > 15 (elevated LV filling pressure), trivial AI/MR, mild to mod LAE, mild RVE, mild reduced RVSF, mild RAE, mod TR, PASP 74 (severe pulmonary HTN)   . CHF (congestive heart failure) ([place])   . CHF exacerbation ([place]) 07/31/2016  . Colon polyps 2002  . Coronary artery disease   . Depression   . Dyslipidemia    hx (03/20/2017)  . GERD (gastroesophageal reflux disease)   . History of bleeding peptic ulcer   . History of echocardiogram    Echo 10/16:  EF 25-30%, poss non-compaction, diff HK with inf-lat and apical HK, restrictive physio, mild AI, severe LAE, mild RVE with mild reduced RVSF, PASP 42 mmHg  . Hyperplastic rectal polyp   . OSA on CPAP    Mild  . Tobacco abuse    Remote    Past Surgical History:  Procedure Laterality Date  . CARDIAC CATHETERIZATION  2007;  2009  . CARDIAC DEFIBRILLATOR PLACEMENT  03/20/2017  . COLONOSCOPY WITH PROPOFOL N/A 01/21/2019   Procedure: COLONOSCOPY WITH PROPOFOL;  Surgeon: Owens Loffler  P, MD;  Location: [facility];  Service: Endoscopy;  Laterality: N/A;  . CORONARY ARTERY BYPASS GRAFT  2007   Had a left main dissection after catheterization. Underwent a saphenous vein graft to the LAD and a saphenous vein graft to obtuse marginal.  . ICD IMPLANT N/A 03/20/2017   Procedure: ICD Implant;  Surgeon: Constance Haw, MD;  Location: [facility];  Service: Cardiovascular;  Laterality: N/A;  . PATELLAR TENDON REPAIR Right 2005  . POLYPECTOMY  01/21/2019   Procedure: POLYPECTOMY;  Surgeon: Milus Banister, MD;  Location: [facility];  Service: Endoscopy;;    No Known Allergies  Current Outpatient Medications  Medication Sig Dispense Refill  . aspirin EC 81 MG tablet Take 81 mg by mouth daily.    Marland Kitchen BLACK CURRANT SEED OIL PO Take 1 capsule by mouth daily.    . blood glucose meter kit and supplies KIT Dispense based on patient and insurance preference. Use up to four times daily as directed. 1 each 0  . carvedilol (COREG) 12.5 MG tablet Take 1 tablet (12.5 mg total) by mouth 2 (two) times daily. 60 tablet 11  . Menthol, Topical Analgesic, (ABSORBINE JR BACK PATCH EX) Place 1 patch onto the skin daily as needed (pain.).    Marland Kitchen Multiple Vitamin (MULTIVITAMIN WITH MINERALS) TABS tablet Take 1 tablet by mouth daily.    . simvastatin (ZOCOR) 20 MG tablet Take 1 tablet (20 mg total) by mouth at bedtime. 30 tablet 10  . spironolactone (ALDACTONE) 25 MG tablet TAKE 1/2 (ONE-HALF) TABLET BY MOUTH ONCE DAILY 45 tablet 2  . zinc gluconate 50 MG tablet Take 50 mg by mouth daily.    Marland Kitchen torsemide (DEMADEX) 20 MG tablet Take 2 tablets (40 mg total) by mouth daily. 60 tablet 6   Current Facility-Administered Medications  Medication Dose Route Frequency Provider Last Rate Last Admin  . methylPREDNISolone acetate (DEPO-MEDROL) injection 40 mg  40 mg Intra-articular Once Hilts, Legrand Como, MD        Social History   Socioeconomic History  . Marital status: Divorced    Spouse name: Not on file  . Number of children: 5  . Years of education: Not on file  . Highest education level: Not on file  Occupational History  . Occupation: Civil engineer, contracting    Employer: [employer]  Tobacco Use  . Smoking status: Former Smoker    Packs/day: 0.10    Years: 7.00    Pack years: 0.70    Types: Cigarettes  .  Smokeless tobacco: Never Used  . Tobacco comment: 03/20/2017 "quit before 2000"  Substance and Sexual Activity  . Alcohol use: Yes    Alcohol/week: 0.0 standard drinks    Comment: 03/20/2017 "might have 6 drinks/year @ the most"  . Drug use: Yes    Types: Marijuana    Comment: 03/20/2017 "joint qod"  . Sexual activity: Not Currently  Other Topics Concern  . Not on file  Social History Narrative   Single   Social Determinants of Health   Financial Resource Strain:   . Difficulty of Paying Living Expenses:   Food Insecurity:   . Worried About Charity fundraiser in the Last Year:   . Arboriculturist in the Last Year:   Transportation Needs:   . Film/video editor (Medical):   Marland Kitchen Lack of Transportation (Non-Medical):   Physical Activity:   . Days of Exercise per Week:   . Minutes of Exercise per Session:  Stress:   . Feeling of Stress :   Social Connections:   . Frequency of Communication with Friends and Family:   . Frequency of Social Gatherings with Friends and Family:   . Attends Religious Services:   . Active Member of Clubs or Organizations:   . Attends Archivist Meetings:   Marland Kitchen Marital Status:   Intimate Partner Violence:   . Fear of Current or Ex-Partner:   . Emotionally Abused:   Marland Kitchen Physically Abused:   . Sexually Abused:     Family History  Problem Relation Age of Onset  . Hypertension Mother   . Osteoarthritis Mother   . Heart failure Father 51  . High blood pressure Father   . Lung cancer Other   . Cancer Maternal Grandmother        uncertain type  . Bipolar disorder Daughter   . Diabetes Daughter   . Cancer Maternal Uncle        uncertain type  . Diabetes Niece      ROS General: Negative; No fevers, chills, or night sweats HEENT: Negative; No changes in vision or hearing, sinus congestion, difficulty swallowing Pulmonary: Negative; No cough, wheezing, shortness of breath, hemoptysis Cardiovascular: Positive for a nonischemic  cardiomyopathy.  Hospitalization in early August 2019 with volume overload with aggressive diuresis of 22 pounds.  Several month history of progressive increasing shortness of breath and frequent panic attacks sensations GI: Negative; No nausea, vomiting, diarrhea, or abdominal pain GU: Negative; No dysuria, hematuria, or difficulty voiding Musculoskeletal: Negative; no myalgias, joint pain, or weakness Hematologic: Negative; no easy bruising, bleeding Endocrine: Negative; no heat/cold intolerance Neuro: Negative; no changes in balance, headaches Skin: Negative; No rashes or skin lesions Psychiatric: Negative; No behavioral problems, depression Sleep: Positive for sleep apnea as noted above with epic and daytime sleepiness, hypersomnolence; no bruxism, restless legs, hypnogognic hallucinations, no cataplexy   Physical Exam BP 114/70   Pulse (!) 58   Ht 6' 1" (1.854 m)   Wt 234 lb (106.1 kg)   SpO2 97%   BMI 30.87 kg/m   Repeat blood pressure by me was 110/68  Wt Readings from Last 3 Encounters:  01/20/20 234 lb (106.1 kg)  11/30/19 229 lb 9.6 oz (104.1 kg)  11/26/19 232 lb (105.2 kg)   General: Alert, oriented, no distress.  Skin: normal turgor, no rashes, warm and dry HEENT: Normocephalic, atraumatic. Pupils equal round and reactive to light; sclera anicteric; extraocular muscles intact;  Nose without nasal septal hypertrophy Mouth/Parynx benign; Mallinpatti scale 3 Neck: JVD increased to 8 cm , no carotid bruits; normal carotid upstroke Lungs: clear to ausculatation and percussion; no wheezing or rales Chest wall: without tenderness to palpitation Heart: PMI not displaced, RRR, s1 s2 normal, 1/6 systolic murmur, no diastolic murmur, no rubs, gallops, thrills, or heaves Abdomen: soft, nontender; no hepatosplenomehaly, BS+; abdominal aorta nontender and not dilated by palpation. Back: no CVA tenderness Pulses 2+ Musculoskeletal: full range of motion, normal strength, no joint  deformities Extremities: no clubbing cyanosis or edema, Homan's sign negative  Neurologic: grossly nonfocal; Cranial nerves grossly wnl Psychologic: Normal mood and affect   ECG (independently read by me): sinus bradycardia 57 bpm.  PVC.  Low voltage.  QTc interval 451 ms.  September 2017 ECG (independently read by me): Not done, but his ECG from 02/19/2016 was reviewed and reveals sinus bradycardia at 56, left posterior hemiblock, lateral T-wave changes, and small Q waves in lead 3 and aVF.  LABS:  BMP  Latest Ref Rng & Units 11/29/2019 04/13/2019 03/04/2019  Glucose 65 - 99 mg/dL 153(H) 97 97  BUN 6 - 24 mg/dL _0 Creatinine 0.76 - 1.27 mg/dL 1.53(H) 1.41(H) 1.57(H)  BUN/Creat Ratio 9 - _1 -  Sodium 134 - 144 mmol/L 140 139 140  Potassium 3.5 - 5.2 mmol/L 4.3 4.4 4.0  Chloride 96 - 106 mmol/L 99 100 104  CO2 20 - 29 mmol/L _2 Calcium 8.7 - 10.2 mg/dL 9.5 10.0 9.4     Hepatic Function Latest Ref Rng & Units 11/25/2018 08/01/2016 04/29/2015  Total Protein 6.0 - 8.3 g/dL 6.7 6.8 6.5  Albumin 3.5 - 5.2 g/dL 4.2 3.5 3.8  AST 0 - 37 U/L _3 ALT 0 - 53 U/L 34 25 28  Alk Phosphatase 39 - 117 U/L 51 46 40  Total Bilirubin 0.2 - 1.2 mg/dL 0.5 1.5(H) 0.3  Bilirubin, Direct 0.0 - 0.3 mg/dL - - -     CBC Latest Ref Rng & Units 11/25/2018 09/21/2018 06/10/2018  WBC 4.0 - 10.5 K/uL 5.5 8.5 7.4  Hemoglobin 13.0 - 17.0 g/dL 13.7 16.0 15.4  Hematocrit 39.0 - 52.0 % 41.7 48.0 48.4  Platelets 150.0 - 400.0 K/uL 142.0(L) 249 148(L)     Lipid Panel     Component Value Date/Time   CHOL 175 11/25/2018 1640   TRIG 186.0 (H) 11/25/2018 1640   HDL 45.20 11/25/2018 1640   CHOLHDL 4 11/25/2018 1640   VLDL 37.2 11/25/2018 1640   LDLCALC 93 11/25/2018 1640   LDLDIRECT 166.4 05/17/2010 0905     RADIOLOGY: No results found.  IMPRESSION:  1. Complex sleep apnea syndrome   2. Nonischemic cardiomyopathy ([place])   3. SOB (shortness of breath)   4. Dilated cardiomyopathy  ([place])   5. S/P ICD (internal cardiac defibrillator) procedure   6. Excessive daytime sleepiness      ASSESSMENT AND PLAN: Mr. Maximino Cozzolino is a 59year-old African-American male who has a history of a cardiomyopathy.  He is status post CABG revascularization x2 in 2007.  In the past his ejection fraction was in the 40-45% range, but this has further reduced to approximately 25%.  In 2017 he was diagnosed with  moderately severe sleep apnea with an overall AHI of 26.7.  However, there was a significant positional component with supine sleep AHI at 43.3 per hour, consistent with severe sleep apnea with supine posture.  A download at that time demonstrated  an AHI of 6.3 at 9 cm water pressure.  I adjusted him to an auto mode due to the severity of his sleep apnea with supine sleep and the need for increased pressure requirements.  He is now status post ICD implantation for his significant LV dysfunction.  His last echo Doppler study in May 2019 continued to show an EF of 20 to 25%.  He has had several heart failure hospitalizations with significant response to diuretic therapy.  Over the past several months he has noticed progressive increasing shortness of breath with less activity and has noticed more shortness of breath at rest.  He is followed by Dr. [name] and is on spironolactone 12.5 mg, torsemide 20 mg, in addition to carvedilol 12.5 mg twice a day.  His blood pressure has been low in I suspect this is the reason he is not on Entresto.  Also he may possibly benefit from therapy with Jardiance.  However I would defer these decisions to Dr. [name] who  is his primary cardiologist.  From a sleep perspective, he is not doing well with BiPAP therapy.  His most recent download on December 20, 2019 through January 18, 2020 shows reduced compliance with only 47% of usage days averaging only 2 hours and 49 minutes.  AHI is significantly elevated 24.3 and he continues to have both apneic events with an AHI of  24.2 as well as central events with an 8 AHI of 16.3.  With the central events he would ideally benefit from the new ASV therapy is available with significant  pressure support ranges from 5 to 15 cm of water but unfortunately based on the old Serve heart failure trial which used a fixed pressure support this is felt to be still contraindicated by guidelines.  Hopefully the data once available the new clinical trials will allow gentle therapy.  I am concerned with his increasing shortness of breath and have suggested a follow-up echo Doppler study I recommended close follow-up evaluation with Dr. [name].  Presently, I will change his settings on his current BiPAP machine and will change his ramp time to 0 as oftentimes he states that he feels like he needs more air when he places his mask on.  We will continue with a minimum EPAP pressure of 8 but increase of maximum IPAP pressure up to 25.  Currently his pressure support is set at 8 cm of water.  A new download will be obtained in 1 to 2 months.  We will contact him regarding the results.  I will plan to see him in 6 months for reevaluation.   Troy Sine, MD, [facility]  01/25/2020 6:07 PM

## 2020-01-20 NOTE — Patient Instructions (Signed)
Medication Instructions:  CONTINUE WITH CURRENT MEDICATIONS. NO CHANGES.  *If you need a refill on your cardiac medications before your next appointment, please call your pharmacy*   Testing/Procedures: Your physician has requested that you have an echocardiogram. Echocardiography is a painless test that uses sound waves to create images of your heart. It provides your doctor with information about the size and shape of your heart and how well your heart's chambers and valves are working. This procedure takes approximately one hour. There are no restrictions for this procedure.  [address]  Follow-Up: At [facility], you and your health needs are our priority.  As part of our continuing mission to provide you with exceptional heart care, we have created designated Provider Care Teams.  These Care Teams include your primary Cardiologist (physician) and Advanced Practice Providers (APPs -  Physician Assistants and Nurse Practitioners) who all work together to provide you with the care you need, when you need it.  We recommend signing up for the patient portal called "MyChart".  Sign up information is provided on this After Visit Summary.  MyChart is used to connect with patients for Virtual Visits (Telemedicine).  Patients are able to view lab/test results, encounter notes, upcoming appointments, etc.  Non-urgent messages can be sent to your provider as well.   To learn more about what you can do with MyChart, go to [email or web address].    Your next appointment:   AFTER YOUR ECHO  The format for your next appointment:   In Person  Provider:   Minus Breeding, MD

## 2020-01-25 ENCOUNTER — Encounter: Payer: Self-pay | Admitting: Cardiovascular Disease

## 2020-02-04 ENCOUNTER — Other Ambulatory Visit: Payer: Self-pay

## 2020-02-04 ENCOUNTER — Ambulatory Visit (HOSPITAL_COMMUNITY): Payer: Medicare HMO | Attending: Cardiovascular Disease

## 2020-02-04 DIAGNOSIS — I428 Other cardiomyopathies: Secondary | ICD-10-CM

## 2020-02-04 DIAGNOSIS — R0602 Shortness of breath: Secondary | ICD-10-CM | POA: Diagnosis not present

## 2020-02-04 MED ORDER — PERFLUTREN LIPID MICROSPHERE
1.0000 mL | INTRAVENOUS | Status: AC | PRN
  Administered 2020-02-04: 2 mL via INTRAVENOUS

## 2020-02-07 DIAGNOSIS — Z0100 Encounter for examination of eyes and vision without abnormal findings: Secondary | ICD-10-CM | POA: Diagnosis not present

## 2020-02-10 ENCOUNTER — Other Ambulatory Visit: Payer: Self-pay

## 2020-02-10 DIAGNOSIS — Z7189 Other specified counseling: Secondary | ICD-10-CM | POA: Insufficient documentation

## 2020-02-10 NOTE — Progress Notes (Signed)
Cardiology Office Note   Date:  02/11/2020   ID:  [name], DOB 1961-06-11, MRN [id number]  PCP:  Caren Macadam, MD  Cardiologist:   Minus Breeding, MD   Chief Complaint  Patient presents with  . Shortness of Breath      History of Present Illness: Bruce Robertson is a 59 y.o. male who presents for follow up of a non-ischemic cardiomyopathy (EF initially 40%, improved to 45%, then dropped to 20-25%), s/p CABG in 2007 due to diagnostic cath complicated by LM dissection, s/p ICD for primary prevention.  His course over the years has been difficult because of social issues, occasional inability to obtain meds and inconsistent follow up with appts.    He presents for follow up.  He has had increased SOB.  Since I last saw him he has had an appt with Dr. Claiborne Billings and had adjustment to his BiPAP.  He saw Dr. Curt Bears and had normal ICD function with no events. He had a repeat echo and had an EF of 15 - 20%.   He had severely elevated pulmonary pressures as well with moderate RV dysfunction.   He has severe TR.  He actually feels better since he had his BiPAP adjusted.  He says he is sleeping better.  He is less fatigued.  He is not having the cough that he was having.  He is still however describing 2 pillow orthopnea.  Sounds like he has class III symptoms.  He is not really having any leg swelling.  He is short of breath halfway up the stairs.  He denies any chest discomfort, neck or arm discomfort.  He is not noticing any palpitations, presyncope or syncope.  He has no significant weight gain though it fluctuates.  He does watch his salt and weight himself daily.   Past Medical History:  Diagnosis Date  . AICD (automatic cardioverter/defibrillator) present   . Anxiety   . Cardiomyopathy    Nonischemic. EF has been about 45%. S/P CABG.;  b.  Echo 4/14: EF 25%, global HK with inf and mid apical AK, restrictive physiology with E/e' > 15 (elevated LV filling pressure), trivial  AI/MR, mild to mod LAE, mild RVE, mild reduced RVSF, mild RAE, mod TR, PASP 74 (severe pulmonary HTN)   . CHF (congestive heart failure) ([place])   . CHF exacerbation ([place]) 07/31/2016  . Colon polyps 2002  . Coronary artery disease   . Depression   . Dyslipidemia    hx (03/20/2017)  . GERD (gastroesophageal reflux disease)   . History of bleeding peptic ulcer   . History of echocardiogram    Echo 10/16:  EF 25-30%, poss non-compaction, diff HK with inf-lat and apical HK, restrictive physio, mild AI, severe LAE, mild RVE with mild reduced RVSF, PASP 42 mmHg  . Hyperplastic rectal polyp   . OSA on CPAP    Mild  . Tobacco abuse    Remote    Past Surgical History:  Procedure Laterality Date  . CARDIAC CATHETERIZATION  2007;  2009  . CARDIAC DEFIBRILLATOR PLACEMENT  03/20/2017  . COLONOSCOPY WITH PROPOFOL N/A 01/21/2019   Procedure: COLONOSCOPY WITH PROPOFOL;  Surgeon: Milus Banister, MD;  Location: [facility];  Service: Endoscopy;  Laterality: N/A;  . CORONARY ARTERY BYPASS GRAFT  2007   Had a left main dissection after catheterization. Underwent a saphenous vein graft to the LAD and a saphenous vein graft to obtuse marginal.  . ICD IMPLANT N/A 03/20/2017  Procedure: ICD Implant;  Surgeon: Constance Haw, MD;  Location: [facility];  Service: Cardiovascular;  Laterality: N/A;  . PATELLAR TENDON REPAIR Right 2005  . POLYPECTOMY  01/21/2019   Procedure: POLYPECTOMY;  Surgeon: Milus Banister, MD;  Location: [facility];  Service: Endoscopy;;     Current Outpatient Medications  Medication Sig Dispense Refill  . aspirin EC 81 MG tablet Take 81 mg by mouth daily.    Marland Kitchen BLACK CURRANT SEED OIL PO Take 1 capsule by mouth daily.    . blood glucose meter kit and supplies KIT Dispense based on patient and insurance preference. Use up to four times daily as directed. 1 each 0  . carvedilol (COREG) 12.5 MG tablet Take 1 tablet (12.5 mg total) by mouth 2 (two) times daily. 60 tablet  11  . Menthol, Topical Analgesic, (ABSORBINE JR BACK PATCH EX) Place 1 patch onto the skin daily as needed (pain.).    Marland Kitchen Multiple Vitamin (MULTIVITAMIN WITH MINERALS) TABS tablet Take 1 tablet by mouth daily.    . simvastatin (ZOCOR) 40 MG tablet Take 1 tablet (40 mg total) by mouth at bedtime. 90 tablet 3  . spironolactone (ALDACTONE) 25 MG tablet TAKE 1/2 (ONE-HALF) TABLET BY MOUTH ONCE DAILY 45 tablet 2  . zinc gluconate 50 MG tablet Take 50 mg by mouth daily.    Marland Kitchen torsemide (DEMADEX) 20 MG tablet Take 2 tablets (40 mg total) by mouth daily. 60 tablet 6   Current Facility-Administered Medications  Medication Dose Route Frequency Provider Last Rate Last Admin  . methylPREDNISolone acetate (DEPO-MEDROL) injection 40 mg  40 mg Intra-articular Once Hilts, Michael, MD        Allergies:   Patient has no known allergies.     ROS:  Please see the history of present illness.   Otherwise, review of systems are positive for none.   All other systems are reviewed and negative.    PHYSICAL EXAM: VS:  BP 106/70   Pulse 60   Temp (!) 97.3 F (36.3 C)   Ht 6' 1"  (1.854 m)   Wt 228 lb 9.6 oz (103.7 kg)   SpO2 97%   BMI 30.16 kg/m  , BMI Body mass index is 30.16 kg/m. GENERAL:  Well appearing NECK:  No jugular venous distention, positive HJR, waveform within normal limits, carotid upstroke brisk and symmetric, no bruits, no thyromegaly LUNGS:  Clear to auscultation bilaterally CHEST:  Unremarkable HEART:  PMI not displaced or sustained,S1 and S2 within normal limits, no S3, no S4, no clicks, no rubs, 3 out of 6 holosystolic murmur heard best at the left third and fourth intercostal space, no diastolic murmurs ABD:  Flat, positive bowel sounds normal in frequency in pitch, no bruits, no rebound, no guarding, no midline pulsatile mass, no hepatomegaly, no splenomegaly EXT:  2 plus pulses throughout, no edema, no cyanosis no clubbing   EKG:  EKG is not ordered today.    Recent  Labs: 11/29/2019: BNP 156.1; BUN 18; Creatinine, Ser 1.53; Potassium 4.3; Sodium 140    Lipid Panel    Component Value Date/Time   CHOL 175 11/25/2018 1640   TRIG 186.0 (H) 11/25/2018 1640   HDL 45.20 11/25/2018 1640   CHOLHDL 4 11/25/2018 1640   VLDL 37.2 11/25/2018 1640   LDLCALC 93 11/25/2018 1640   LDLDIRECT 166.4 05/17/2010 0905      Wt Readings from Last 3 Encounters:  02/11/20 228 lb 9.6 oz (103.7 kg)  01/20/20 234 lb (  106.1 kg)  11/30/19 229 lb 9.6 oz (104.1 kg)      Other studies Reviewed: Additional studies/ records that were reviewed today include: Device interrogation Review of the above records demonstrates:  Please see elsewhere in the note.     Current medicines are reviewed at length with the patient today.  The patient does not have concerns regarding medicines.  The following changes have been made:  NA  Labs/ tests ordered today include:   Orders Placed This Encounter  Procedures  . Cardiopulmonary exercise test  . AMB referral to CHF clinic    ASSESSMENT AND PLAN:    DOE:  He has progressive decline in LV function.  He has progressed to likely require advanced therapies.  He has not tolerated med titration in the past.  He was seen in Advanced HF clinic in the past but fell away from this.  I will refer him back.  I will order CPX testing.  He has had progression of his cardiomyopathy.  He seems to have some more RV dysfunction.  For now I do not think he will tolerate med titration.  We talked about salt and fluid restriction and he is adherent with this.  ICD:  He has had normal ICD function.    OSA:    He has had adjustment to his device.   S/p CABG:  Is not describing any chest pain.  No further work-up.  HLD: LDL is 93 with an HDL of 45.  I will increase his simvastatin to 40 mg daily.  DM:    A1c was 6.8 in April and he should have follow-up of this soon.  I will defer to Caren Macadam, MD   CKD stage 3:  This has been stable with  his most recent creatinine being 1.53.   COVID EDUCATION: We talked about the vaccine and he thinks he will get it.   Signed, Minus Breeding, MD  02/11/2020 9:42 AM    [facility]

## 2020-02-11 ENCOUNTER — Encounter: Payer: Self-pay | Admitting: Family Medicine

## 2020-02-11 ENCOUNTER — Encounter: Payer: Self-pay | Admitting: Cardiology

## 2020-02-11 ENCOUNTER — Ambulatory Visit (INDEPENDENT_AMBULATORY_CARE_PROVIDER_SITE_OTHER): Payer: Medicare HMO | Admitting: Family Medicine

## 2020-02-11 ENCOUNTER — Ambulatory Visit (INDEPENDENT_AMBULATORY_CARE_PROVIDER_SITE_OTHER): Payer: Medicare HMO | Admitting: Cardiology

## 2020-02-11 ENCOUNTER — Ambulatory Visit (INDEPENDENT_AMBULATORY_CARE_PROVIDER_SITE_OTHER): Payer: Medicare HMO

## 2020-02-11 VITALS — BP 100/62 | HR 63 | Temp 97.2°F | Ht 73.0 in | Wt 229.6 lb

## 2020-02-11 VITALS — BP 106/70 | HR 60 | Temp 97.3°F | Ht 73.0 in | Wt 228.6 lb

## 2020-02-11 DIAGNOSIS — R05 Cough: Secondary | ICD-10-CM | POA: Diagnosis not present

## 2020-02-11 DIAGNOSIS — E1169 Type 2 diabetes mellitus with other specified complication: Secondary | ICD-10-CM | POA: Diagnosis not present

## 2020-02-11 DIAGNOSIS — I38 Endocarditis, valve unspecified: Secondary | ICD-10-CM | POA: Diagnosis not present

## 2020-02-11 DIAGNOSIS — R093 Abnormal sputum: Secondary | ICD-10-CM | POA: Diagnosis not present

## 2020-02-11 DIAGNOSIS — R0600 Dyspnea, unspecified: Secondary | ICD-10-CM

## 2020-02-11 DIAGNOSIS — R7301 Impaired fasting glucose: Secondary | ICD-10-CM | POA: Diagnosis not present

## 2020-02-11 DIAGNOSIS — R0609 Other forms of dyspnea: Secondary | ICD-10-CM

## 2020-02-11 DIAGNOSIS — N1831 Chronic kidney disease, stage 3a: Secondary | ICD-10-CM

## 2020-02-11 DIAGNOSIS — Z4502 Encounter for adjustment and management of automatic implantable cardiac defibrillator: Secondary | ICD-10-CM

## 2020-02-11 DIAGNOSIS — R059 Cough, unspecified: Secondary | ICD-10-CM

## 2020-02-11 DIAGNOSIS — I420 Dilated cardiomyopathy: Secondary | ICD-10-CM | POA: Diagnosis not present

## 2020-02-11 DIAGNOSIS — I10 Essential (primary) hypertension: Secondary | ICD-10-CM

## 2020-02-11 DIAGNOSIS — I509 Heart failure, unspecified: Secondary | ICD-10-CM | POA: Diagnosis not present

## 2020-02-11 DIAGNOSIS — Z7189 Other specified counseling: Secondary | ICD-10-CM

## 2020-02-11 DIAGNOSIS — E785 Hyperlipidemia, unspecified: Secondary | ICD-10-CM | POA: Diagnosis not present

## 2020-02-11 MED ORDER — SIMVASTATIN 40 MG PO TABS: 40.0000 mg | ORAL_TABLET | Freq: Every day | ORAL | 3 refills | Status: DC

## 2020-02-11 NOTE — Patient Instructions (Signed)
Medication Instructions:  Increase ZOCOR to 40mg  DAILY *If you need a refill on your cardiac medications before your next appointment, please call your pharmacy*  Lab Work: None ordered this visit  Testing/Procedures: Your physician has recommended that you have a cardiopulmonary stress test (CPX). CPX testing is a non-invasive measurement of heart and lung function. It replaces a traditional treadmill stress test. This type of test provides a tremendous amount of information that relates not only to your present condition but also for future outcomes. This test combines measurements of you ventilation, respiratory gas exchange in the lungs, electrocardiogram (EKG), blood pressure and physical response before, during, and following an exercise protocol. [place]  Follow-Up: At [facility], you and your health needs are our priority.  As part of our continuing mission to provide you with exceptional heart care, we have created designated Provider Care Teams.  These Care Teams include your primary Cardiologist (physician) and Advanced Practice Providers (APPs -  Physician Assistants and Nurse Practitioners) who all work together to provide you with the care you need, when you need it.   Your next appointment:   Follow up with Dr. Haroldine Laws  Other Instructions Referral to heart failure clinic

## 2020-02-11 NOTE — Progress Notes (Signed)
Bruce Robertson DOB: 02/05/1961 Encounter date: 02/11/2020  This is a 59 y.o. male who presents with Chief Complaint  Patient presents with  . Abdominal Pain    patient complains of painful knot along the right ribs x1 year, pain x2 weeks  . patient complains of black sputum x3-4 months ago, states he  . Cough    with black sputum x3-4 months    History of present illness: Talked with Dr. [name] this morning about breathing.   Black in sputum has been there for a couple months. Some days worse than others. Some times a lot, and sometimes not a lot. Since changes in setting on sleep machine, this has helped. More short of breath lately. Most commonly notes going up stairs; sometimes has to stop with this or even taking out trash. Sputum looks like mucous but thicker. Black in the mucous; no blood in mucous. Cough is not painful. Sometimes wheezing. No sinus pressure/pain. Has had some left sided headaches.    No Known Allergies Current Meds  Medication Sig  . aspirin EC 81 MG tablet Take 81 mg by mouth daily.  Marland Kitchen BLACK CURRANT SEED OIL PO Take 1 capsule by mouth daily.  . blood glucose meter kit and supplies KIT Dispense based on patient and insurance preference. Use up to four times daily as directed.  . carvedilol (COREG) 12.5 MG tablet Take 1 tablet (12.5 mg total) by mouth 2 (two) times daily.  . Menthol, Topical Analgesic, (ABSORBINE JR BACK PATCH EX) Place 1 patch onto the skin daily as needed (pain.).  Marland Kitchen Multiple Vitamin (MULTIVITAMIN WITH MINERALS) TABS tablet Take 1 tablet by mouth daily.  . simvastatin (ZOCOR) 40 MG tablet Take 1 tablet (40 mg total) by mouth at bedtime.  Marland Kitchen spironolactone (ALDACTONE) 25 MG tablet TAKE 1/2 (ONE-HALF) TABLET BY MOUTH ONCE DAILY  . zinc gluconate 50 MG tablet Take 50 mg by mouth daily.   Current Facility-Administered Medications for the 02/11/20 encounter (Office Visit) with Caren Macadam, MD  Medication  . methylPREDNISolone acetate  (DEPO-MEDROL) injection 40 mg    Review of Systems  Constitutional: Negative for chills, fatigue and fever.  HENT: Negative for congestion, ear pain, nosebleeds, postnasal drip, rhinorrhea, sinus pressure, sinus pain, sore throat and trouble swallowing.   Respiratory: Positive for cough and shortness of breath. Negative for chest tightness and wheezing.   Cardiovascular: Negative for chest pain, palpitations and leg swelling.  Gastrointestinal: Negative for abdominal pain, blood in stool, diarrhea, nausea and vomiting.    Objective:  BP 100/62 (BP Location: Left Arm, Patient Position: Sitting, Cuff Size: Large)   Pulse 63   Temp (!) 97.2 F (36.2 C) (Temporal)   Ht 6' 1"  (1.854 m)   Wt 229 lb 9.6 oz (104.1 kg)   SpO2 97%   BMI 30.29 kg/m   Weight: 229 lb 9.6 oz (104.1 kg)   BP Readings from Last 3 Encounters:  02/11/20 100/62  02/11/20 106/70  01/20/20 114/70   Wt Readings from Last 3 Encounters:  02/11/20 229 lb 9.6 oz (104.1 kg)  02/11/20 228 lb 9.6 oz (103.7 kg)  01/20/20 234 lb (106.1 kg)    Physical Exam Constitutional:      General: He is not in acute distress.    Appearance: He is well-developed.  HENT:     Head: Normocephalic and atraumatic.     Right Ear: Tympanic membrane, ear canal and external ear normal.     Left Ear: Tympanic membrane, ear canal  and external ear normal.     Nose: Nose normal.     Right Turbinates: Not enlarged or swollen.     Left Turbinates: Not enlarged or swollen.     Right Sinus: No maxillary sinus tenderness or frontal sinus tenderness.     Left Sinus: No maxillary sinus tenderness or frontal sinus tenderness.  Cardiovascular:     Rate and Rhythm: Normal rate and regular rhythm.     Heart sounds: Normal heart sounds. No murmur. No friction rub.  Pulmonary:     Effort: Pulmonary effort is normal. No respiratory distress.     Breath sounds: Normal breath sounds. No wheezing or rales.  Musculoskeletal:     Right lower leg: No  edema.     Left lower leg: No edema.  Neurological:     Mental Status: He is alert and oriented to person, place, and time.  Psychiatric:        Behavior: Behavior normal.     Assessment/Plan  1. Cough Uncertain etiology of cough with black sputum. He is otherwise feeling well. Will start with bloodwork and CXR; further eval pending this. - CBC with Differential/Platelet; Future - DG Chest 2 View; Future - DG Chest 2 View - CBC with Differential/Platelet  2. Abnormal sputum color See above  3. Essential hypertension Well controlled. Continue current medications.  4. Hyperlipidemia, unspecified hyperlipidemia type Has been well controlled with current medications. - Comprehensive metabolic panel; Future - Lipid panel; Future - Lipid panel - Comprehensive metabolic panel  5. Cardiomyopathy, dilated, nonischemic [facility]) Following with cardiology. They are currently evaluating myopathy.  6. Hyperlipidemia associated with type 2 diabetes mellitus ([place]) Will recheck A1C. He does eat healthy. Further discussion at [place] in future. - Hemoglobin A1c; Future - Microalbumin / creatinine urine ratio; Future - Microalbumin / creatinine urine ratio - Hemoglobin A1c    Return for pending lab/xray.    Micheline Rough, MD

## 2020-02-12 LAB — COMPREHENSIVE METABOLIC PANEL
AG Ratio: 1.8 (calc) (ref 1.0–2.5)
ALT: 25 U/L (ref 9–46)
AST: 28 U/L (ref 10–35)
Albumin: 4.2 g/dL (ref 3.6–5.1)
Alkaline phosphatase (APISO): 49 U/L (ref 35–144)
BUN/Creatinine Ratio: 13 (calc) (ref 6–22)
BUN: 21 mg/dL (ref 7–25)
CO2: 32 mmol/L (ref 20–32)
Calcium: 9.6 mg/dL (ref 8.6–10.3)
Chloride: 100 mmol/L (ref 98–110)
Creat: 1.6 mg/dL — ABNORMAL HIGH (ref 0.70–1.33)
Globulin: 2.4 g/dL (calc) (ref 1.9–3.7)
Glucose, Bld: 98 mg/dL (ref 65–99)
Potassium: 3.9 mmol/L (ref 3.5–5.3)
Sodium: 140 mmol/L (ref 135–146)
Total Bilirubin: 0.7 mg/dL (ref 0.2–1.2)
Total Protein: 6.6 g/dL (ref 6.1–8.1)

## 2020-02-12 LAB — MICROALBUMIN / CREATININE URINE RATIO
Creatinine, Urine: 67 mg/dL (ref 20–320)
Microalb Creat Ratio: 6 mcg/mg creat (ref <30)
Microalb, Ur: 0.4 mg/dL

## 2020-02-12 LAB — HEMOGLOBIN A1C
Hgb A1c MFr Bld: 6.6 % of total Hgb — ABNORMAL HIGH (ref <5.7)
Mean Plasma Glucose: 143 (calc)
eAG (mmol/L): 7.9 (calc)

## 2020-02-12 LAB — LIPID PANEL
Cholesterol: 161 mg/dL (ref <200)
HDL: 46 mg/dL (ref >=40)
LDL Cholesterol (Calc): 92 mg/dL (calc)
Non-HDL Cholesterol (Calc): 115 mg/dL (calc) (ref <130)
Total CHOL/HDL Ratio: 3.5 (calc) (ref <5.0)
Triglycerides: 135 mg/dL (ref <150)

## 2020-02-12 LAB — CBC WITH DIFFERENTIAL/PLATELET
Absolute Monocytes: 632 cells/uL (ref 200–950)
Basophils Absolute: 19 cells/uL (ref 0–200)
Basophils Relative: 0.3 %
Eosinophils Absolute: 99 cells/uL (ref 15–500)
Eosinophils Relative: 1.6 %
HCT: 43.4 % (ref 38.5–50.0)
Hemoglobin: 13.9 g/dL (ref 13.2–17.1)
Lymphs Abs: 2195 cells/uL (ref 850–3900)
MCH: 27.7 pg (ref 27.0–33.0)
MCHC: 32 g/dL (ref 32.0–36.0)
MCV: 86.6 fL (ref 80.0–100.0)
MPV: 12.1 fL (ref 7.5–12.5)
Monocytes Relative: 10.2 %
Neutro Abs: 3255 cells/uL (ref 1500–7800)
Neutrophils Relative %: 52.5 %
Platelets: 146 10*3/uL (ref 140–400)
RBC: 5.01 10*6/uL (ref 4.20–5.80)
RDW: 14.2 % (ref 11.0–15.0)
Total Lymphocyte: 35.4 %
WBC: 6.2 10*3/uL (ref 3.8–10.8)

## 2020-02-13 ENCOUNTER — Other Ambulatory Visit: Payer: Self-pay | Admitting: Family Medicine

## 2020-02-13 DIAGNOSIS — R079 Chest pain, unspecified: Secondary | ICD-10-CM

## 2020-02-13 DIAGNOSIS — R05 Cough: Secondary | ICD-10-CM

## 2020-02-13 DIAGNOSIS — R059 Cough, unspecified: Secondary | ICD-10-CM

## 2020-02-14 MED ORDER — BLOOD GLUCOSE MONITOR KIT: PACK | 0 refills | Status: AC

## 2020-02-14 NOTE — Addendum Note (Signed)
Addended by: Lahoma Crocker A on: 02/14/2020 03:00 PM   Modules accepted: Orders

## 2020-02-14 NOTE — Addendum Note (Signed)
Addended by: Agnes Lawrence on: 02/14/2020 02:56 PM   Modules accepted: Orders

## 2020-02-15 ENCOUNTER — Telehealth: Payer: Self-pay

## 2020-02-15 ENCOUNTER — Telehealth: Payer: Self-pay | Admitting: Cardiovascular Disease

## 2020-02-15 NOTE — Telephone Encounter (Signed)
Left message for patient to call back  

## 2020-02-15 NOTE — Telephone Encounter (Signed)
LVM2CB to review echo results

## 2020-02-15 NOTE — Telephone Encounter (Signed)
Patient states he is returning call, requesting echocardiogram results from echocardiogram completed on 02/04/20. Please call to discuss.

## 2020-02-16 ENCOUNTER — Ambulatory Visit: Payer: Medicare HMO | Admitting: Cardiology

## 2020-02-17 ENCOUNTER — Ambulatory Visit (INDEPENDENT_AMBULATORY_CARE_PROVIDER_SITE_OTHER): Payer: Medicare HMO | Admitting: *Deleted

## 2020-02-17 DIAGNOSIS — I428 Other cardiomyopathies: Secondary | ICD-10-CM

## 2020-02-17 LAB — CUP PACEART REMOTE DEVICE CHECK
Battery Remaining Longevity: 112 mo
Battery Voltage: 3 V
Brady Statistic RV Percent Paced: 1.05 %
Date Time Interrogation Session: 20210415033322
HighPow Impedance: 63 Ohm
Implantable Lead Implant Date: 20180517
Implantable Lead Location: 753860
Implantable Pulse Generator Implant Date: 20180517
Lead Channel Impedance Value: 361 Ohm
Lead Channel Impedance Value: 456 Ohm
Lead Channel Pacing Threshold Amplitude: 0.625 V
Lead Channel Pacing Threshold Pulse Width: 0.4 ms
Lead Channel Sensing Intrinsic Amplitude: 11 mV
Lead Channel Sensing Intrinsic Amplitude: 11 mV
Lead Channel Setting Pacing Amplitude: 2.5 V
Lead Channel Setting Pacing Pulse Width: 0.4 ms
Lead Channel Setting Sensing Sensitivity: 0.3 mV

## 2020-02-18 NOTE — Progress Notes (Signed)
ICD Remote  

## 2020-02-22 ENCOUNTER — Telehealth: Payer: Self-pay | Admitting: Cardiology

## 2020-02-22 DIAGNOSIS — I5022 Chronic systolic (congestive) heart failure: Secondary | ICD-10-CM

## 2020-02-22 DIAGNOSIS — I428 Other cardiomyopathies: Secondary | ICD-10-CM

## 2020-02-22 NOTE — Telephone Encounter (Signed)
New Message    Pt is calling and says he has personal questions and would like for the nurse to call him back   Please call

## 2020-02-22 NOTE — Telephone Encounter (Signed)
Spoke with pt, he was referred to the heart failure clinic by dr hochrein and he would like to be referred to [facility] instead. He reports he has been to [facility] and he did not care for it and would like to go to [facility] instead. Will forward to dr hochrein to review and advsie

## 2020-02-23 NOTE — Telephone Encounter (Signed)
Please send a referral to [facility].

## 2020-02-23 NOTE — Telephone Encounter (Signed)
Patient made aware referral placed.

## 2020-02-24 ENCOUNTER — Other Ambulatory Visit (HOSPITAL_COMMUNITY)
Admission: RE | Admit: 2020-02-24 | Discharge: 2020-02-24 | Disposition: A | Discharge status: Home / Self Care | Payer: Medicare HMO | Source: Ambulatory Visit | Attending: Cardiology | Admitting: Cardiology

## 2020-02-24 DIAGNOSIS — Z01812 Encounter for preprocedural laboratory examination: Secondary | ICD-10-CM | POA: Insufficient documentation

## 2020-02-24 DIAGNOSIS — Z20822 Contact with and (suspected) exposure to covid-19: Secondary | ICD-10-CM | POA: Diagnosis not present

## 2020-02-24 LAB — SARS CORONAVIRUS 2 (TAT 6-24 HRS): SARS Coronavirus 2: NEGATIVE

## 2020-02-28 ENCOUNTER — Encounter (HOSPITAL_COMMUNITY): Payer: Medicare HMO

## 2020-02-29 ENCOUNTER — Other Ambulatory Visit: Payer: Self-pay

## 2020-02-29 ENCOUNTER — Ambulatory Visit
Admission: RE | Admit: 2020-02-29 | Discharge: 2020-02-29 | Disposition: A | Discharge status: Home / Self Care | Payer: Medicare HMO | Source: Ambulatory Visit | Attending: Family Medicine | Admitting: Family Medicine

## 2020-02-29 DIAGNOSIS — R079 Chest pain, unspecified: Secondary | ICD-10-CM

## 2020-02-29 DIAGNOSIS — R05 Cough: Secondary | ICD-10-CM | POA: Diagnosis not present

## 2020-02-29 DIAGNOSIS — R911 Solitary pulmonary nodule: Secondary | ICD-10-CM | POA: Diagnosis not present

## 2020-02-29 DIAGNOSIS — R059 Cough, unspecified: Secondary | ICD-10-CM

## 2020-03-01 ENCOUNTER — Other Ambulatory Visit: Payer: Self-pay | Admitting: *Deleted

## 2020-03-01 DIAGNOSIS — R093 Abnormal sputum: Secondary | ICD-10-CM

## 2020-03-01 DIAGNOSIS — R059 Cough, unspecified: Secondary | ICD-10-CM

## 2020-03-01 DIAGNOSIS — R05 Cough: Secondary | ICD-10-CM

## 2020-03-09 ENCOUNTER — Encounter (HOSPITAL_COMMUNITY): Payer: Medicare HMO | Admitting: Internal Medicine

## 2020-03-14 ENCOUNTER — Telehealth: Payer: Self-pay | Admitting: Family Medicine

## 2020-03-14 NOTE — Chronic Care Management (AMB) (Signed)
  Chronic Care Management   Note  03/14/2020 Name: Bruce Robertson MRN: [id number] DOB: 01/27/1961  [name] is a 59 y.o. year old male who is a primary care patient of Koberlein, Steele Berg, MD. I reached out to Bruce Robertson by phone today in response to a referral sent by [name] PCP, Bruce Macadam, MD.   [name] was given information about Chronic Care Management services today including:  1. CCM service includes personalized support from designated clinical staff supervised by his physician, including individualized plan of care and coordination with other care providers 2. 24/7 contact phone numbers for assistance for urgent and routine care needs. 3. Service will only be billed when office clinical staff spend 20 minutes or more in a month to coordinate care. 4. Only one practitioner may furnish and bill the service in a calendar month. 5. The patient may stop CCM services at any time (effective at the end of the month) by phone call to the office staff.   Patient agreed to services and verbal consent obtained.   Follow up plan:   [place]

## 2020-03-15 ENCOUNTER — Ambulatory Visit (INDEPENDENT_AMBULATORY_CARE_PROVIDER_SITE_OTHER): Payer: Medicare HMO | Admitting: Internal Medicine

## 2020-03-15 ENCOUNTER — Other Ambulatory Visit: Payer: Self-pay

## 2020-03-15 ENCOUNTER — Encounter: Payer: Self-pay | Admitting: Internal Medicine

## 2020-03-15 VITALS — BP 116/70 | HR 75 | Temp 97.9°F | Ht 72.0 in | Wt 230.4 lb

## 2020-03-15 DIAGNOSIS — R053 Chronic cough: Secondary | ICD-10-CM

## 2020-03-15 DIAGNOSIS — R05 Cough: Secondary | ICD-10-CM

## 2020-03-15 DIAGNOSIS — R911 Solitary pulmonary nodule: Secondary | ICD-10-CM | POA: Diagnosis not present

## 2020-03-15 DIAGNOSIS — R0602 Shortness of breath: Secondary | ICD-10-CM | POA: Diagnosis not present

## 2020-03-15 MED ORDER — OMEPRAZOLE 40 MG PO CPDR: 40.0000 mg | DELAYED_RELEASE_CAPSULE | Freq: Every day | ORAL | 5 refills | Status: DC

## 2020-03-15 NOTE — Progress Notes (Signed)
Ed[place] Vi    FM:[id number]    07-Jul-1961  Primary Care Physician:Koberlein, Steele Berg, MD  Referring Physician: Caren Macadam, [place],  [place] [id number] Reason for Consultation: "I can't breathe" Date of Consultation: 03/15/2020  Chief complaint:   Chief Complaint  Patient presents with  . Consult    Abnormal sputum black/brown. cough. Going on for a few months.  Very sick before March 2020. Hx bacterial pna.     HPI: Bruce Robertson is a 59 y.o. man with HFrEF EF 15% who presents for new patient evaluation of dyspnea. Worsening dyspnea over the past 3-4 months. Dyspnea is at rest and exertion. He feels like he is having a panic attack, which lasts for 10-15 seconds. He takes some slow deep breaths and it resolves spontaneously.  Not associated with cough or chest pain. Occasional wheezing.   He coughs "all the time" worse first thing in the morning. Does not wake him up at night.  He has phlegm that comes up. Dark in the morning, lightens up during the day. No blood.  He feels sob now going out to take the trash out, going up 1 flight of stairs. This has been worsening. He cannot mow the lawn anymore. It wipes him out.   Of note has had decline of cardiac function and is being referred to HF clinic for consideration of advanced therapies. He is not on ace/ inhibitor.  No history of asthma, bronchitis, childhood respiratory disease.   He has a CPAP at home and is working with Dr. Claiborne Billings and endorses nightly use since last visit with him.   He tried quitting smoking pot but this didn't make his breathing better. He hasn't been as active, used to work out regularly, has lost 30 lbs he feels from lost muscle.   Diagnosed with bacterial pneumonia in march 2020. Feels like he had covid. Never tested.   Social history:  Occupation: disabled. Has worked as a Administrator.  Exposures: lives at home with fiancee. Has five kids.  Smoking history:  quit cigarettes 20 years ago. 0.25 ppd x 15 years. Smokes pot everyday. Uses water pipe.   Social History   Occupational History  . Occupation: Civil engineer, contracting    Employer: [employer]  Tobacco Use  . Smoking status: Former Smoker    Packs/day: 0.25    Years: 7.00    Pack years: 1.75  . Smokeless tobacco: Never Used  . Tobacco comment: 03/20/2017 "quit before 2000"  Substance and Sexual Activity  . Alcohol use: Yes    Alcohol/week: 0.0 standard drinks    Comment: 03/20/2017 "might have 6 drinks/year @ the most"  . Drug use: Yes    Types: Marijuana    Comment: 03/20/2017 "joint qod"  . Sexual activity: Not Currently    Relevant family history: Family History  Problem Relation Age of Onset  . Hypertension Mother   . Osteoarthritis Mother   . Heart failure Father 66  . High blood pressure Father   . Lung cancer Other   . Cancer Maternal Grandmother        uncertain type  . Bipolar disorder Daughter   . Diabetes Daughter   . Cancer Maternal Uncle        uncertain type  . Diabetes Niece   . Asthma Neg Hx     Past Medical History:  Diagnosis Date  . AICD (automatic cardioverter/defibrillator) present   . Anxiety   .  Cardiomyopathy    Nonischemic. EF has been about 45%. S/P CABG.;  b.  Echo 4/14: EF 25%, global HK with inf and mid apical AK, restrictive physiology with E/e' > 15 (elevated LV filling pressure), trivial AI/MR, mild to mod LAE, mild RVE, mild reduced RVSF, mild RAE, mod TR, PASP 74 (severe pulmonary HTN)   . CHF (congestive heart failure) ([place])   . CHF exacerbation ([place]) 07/31/2016  . Colon polyps 2002  . Coronary artery disease   . Depression   . Dyslipidemia    hx (03/20/2017)  . GERD (gastroesophageal reflux disease)   . History of bleeding peptic ulcer   . History of echocardiogram    Echo 10/16:  EF 25-30%, poss non-compaction, diff HK with inf-lat and apical HK, restrictive physio, mild AI, severe LAE, mild RVE with mild reduced RVSF, PASP 42 mmHg   . Hyperplastic rectal polyp   . OSA on CPAP    Mild  . Pneumonia   . Tobacco abuse    Remote    Past Surgical History:  Procedure Laterality Date  . CARDIAC CATHETERIZATION  2007;  2009  . CARDIAC DEFIBRILLATOR PLACEMENT  03/20/2017  . COLONOSCOPY WITH PROPOFOL N/A 01/21/2019   Procedure: COLONOSCOPY WITH PROPOFOL;  Surgeon: Milus Banister, MD;  Location: [facility];  Service: Endoscopy;  Laterality: N/A;  . CORONARY ARTERY BYPASS GRAFT  2007   Had a left main dissection after catheterization. Underwent a saphenous vein graft to the LAD and a saphenous vein graft to obtuse marginal.  . ICD IMPLANT N/A 03/20/2017   Procedure: ICD Implant;  Surgeon: Constance Haw, MD;  Location: [facility];  Service: Cardiovascular;  Laterality: N/A;  . PATELLAR TENDON REPAIR Right 2005  . POLYPECTOMY  01/21/2019   Procedure: POLYPECTOMY;  Surgeon: Milus Banister, MD;  Location: [facility];  Service: Endoscopy;;     Physical Exam: Blood pressure 116/70, pulse 75, temperature 97.9 F (36.6 C), temperature source Temporal, height 6' (1.829 m), weight 230 lb 6.4 oz (104.5 kg), SpO2 99 %. Gen:      No acute distress ENT:  no nasal polyps, mucus membranes moist Lungs:    No increased respiratory effort, symmetric chest wall excursion, clear to auscultation bilaterally, no wheezes or crackles CV:         Regular rate and rhythm; no murmurs, rubs, or gallops.  No pedal edema Abd:      + bowel sounds; soft, non-tender; no distension MSK: no acute synovitis of DIP or PIP joints, no mechanics hands.  Skin:      Warm and dry; no rashes Neuro: normal speech, no focal facial asymmetry Psych: alert and oriented x3, normal mood and affect   Data Reviewed/Medical Decision Making:  Independent interpretation of tests: Imaging: . Review of patient's CT Chest April 2021 images revealed RUL pulmonary nodule, 1mm, air trapping, mild emphysematous changes. Patulous esophagus. The patient's  images have been independently reviewed by me.    PFTs: None on file  Labs:  Lab Results  Component Value Date   WBC 6.2 02/11/2020   HGB 13.9 02/11/2020   HCT 43.4 02/11/2020   MCV 86.6 02/11/2020   PLT 146 02/11/2020   Lab Results  Component Value Date   NA 140 02/11/2020   K 3.9 02/11/2020   CL 100 02/11/2020   CO2 32 02/11/2020     Immunization status:  Immunization History  Administered Date(s) Administered  . Influenza,inj,Quad PF,6+ Mos 11/25/2018  . Pneumococcal Conjugate-13  11/25/2018    . I reviewed prior external note(s) from cardiology - Dr. Dartha Lodge.  . I reviewed the result(s) of the labs and imaging as noted above.  . I have ordered PFTs.   Assessment:  Shortness of breath Cough with sputum production HFrEF EF 15% - seeing [facility] clinic next week.  Complex sleep apnea syndrome - poorly controlled.  Solitary pulmonary nodule - 62mm  Plan/Recommendations: Suspect dyspnea is most likely secondary to progressive heart failure and deconditioning. He has a very minimal smoking history and I doubt COPD. However will obtain PFTs for completeness of evaluation.    His cough is most likely chronic and related to GERD. Will trial 8 weeks of PPI which I have prescribed.  He has a SPN and needs follow up in March 2022. It has already been stable for 18 months. He would be considered a low risk patient for lung cancer given minimal smoking history which is remote.   Urged him to continue nocturnal NIPPV for his sleep apnea.   We discussed disease management and progression at length today.   I spent 60 minutes in the care of this patient today including pre-charting, chart review, review of results, face-to-face care, coordination of care and communication with consultants etc.).   Return to Care: Return in about 2 months (around 05/15/2020). with APP.   Lenice Llamas, MD [facility] [place]   CC: Caren Macadam, MD    Fleischner Society Guidelines [address], [place], [place], et al. Guidelines for management of incidental pulmonary nodules detected on CT Images: From the Fleischner Society. Radiology 2017; [id number]. Copyright  2017 Radiological Society of [place].  Evaluation of the incidental solid pulmonary nodule in adults Nodule size (mm) Low (<5%) cancer risk High (>65%) or moderate (5 to 65%) cancer risk  Solitary  <6 No routine follow-up Optional CT at 12 months  6 to 8 CT at 6 to 12 months, then consider CT at 18 to 24 months CT at 6 to 12 months, then CT at 18 to 24 months  >8 CT at 3 months, then at 9 and 24 months FDG PET/CT, biopsy or resection  Multiple (evaluation based on largest nodule)  <6 No routine follow-up Optional CT at 12 months  ?6 CT at 3 to 6 months, then consider CT at 18 to 24 months CT at 3 to 6 months, then CT at 18 to 24 months    Not applicable to patients age <35 years, in lung cancer screening, with immunosuppression, known pulmonary disease or symptoms or active primary cancer.  Chest CT performed without contrast as contiguous 1 mm sections using low dose technique.  Growing or FDG-avid nodules should undergo biopsy or resection. Growth is defined as >1.5 mm increase.  Nodules unchanged for >2 years are benign.   CT: computed tomography; FDG: 18-fluorodeoxyglucose; PET: positron emission tomography.

## 2020-03-15 NOTE — Patient Instructions (Signed)
The patient should have follow up scheduled with APP in 2-3 months.   Prior to next visit patient should have: Full set of PFTs  Start taking Prilosec everyday for at least 8 weeks. To see if this helps your cough.

## 2020-03-17 ENCOUNTER — Other Ambulatory Visit (HOSPITAL_COMMUNITY)
Admission: RE | Admit: 2020-03-17 | Discharge: 2020-03-17 | Disposition: A | Discharge status: Home / Self Care | Payer: Medicare HMO | Source: Ambulatory Visit | Attending: Pulmonary Disease | Admitting: Pulmonary Disease

## 2020-03-17 DIAGNOSIS — Z20822 Contact with and (suspected) exposure to covid-19: Secondary | ICD-10-CM | POA: Diagnosis not present

## 2020-03-17 DIAGNOSIS — Z01812 Encounter for preprocedural laboratory examination: Secondary | ICD-10-CM | POA: Diagnosis not present

## 2020-03-17 LAB — SARS CORONAVIRUS 2 (TAT 6-24 HRS): SARS Coronavirus 2: NEGATIVE

## 2020-03-21 ENCOUNTER — Other Ambulatory Visit (HOSPITAL_COMMUNITY): Payer: Self-pay | Admitting: *Deleted

## 2020-03-21 ENCOUNTER — Other Ambulatory Visit: Payer: Self-pay

## 2020-03-21 ENCOUNTER — Ambulatory Visit (HOSPITAL_COMMUNITY): Payer: Medicare HMO | Attending: Cardiology

## 2020-03-21 DIAGNOSIS — Z9989 Dependence on other enabling machines and devices: Secondary | ICD-10-CM | POA: Insufficient documentation

## 2020-03-21 DIAGNOSIS — Z79899 Other long term (current) drug therapy: Secondary | ICD-10-CM | POA: Diagnosis not present

## 2020-03-21 DIAGNOSIS — F17210 Nicotine dependence, cigarettes, uncomplicated: Secondary | ICD-10-CM | POA: Insufficient documentation

## 2020-03-21 DIAGNOSIS — Z95810 Presence of automatic (implantable) cardiac defibrillator: Secondary | ICD-10-CM | POA: Insufficient documentation

## 2020-03-21 DIAGNOSIS — E785 Hyperlipidemia, unspecified: Secondary | ICD-10-CM | POA: Insufficient documentation

## 2020-03-21 DIAGNOSIS — G4733 Obstructive sleep apnea (adult) (pediatric): Secondary | ICD-10-CM | POA: Insufficient documentation

## 2020-03-21 DIAGNOSIS — D128 Benign neoplasm of rectum: Secondary | ICD-10-CM | POA: Diagnosis not present

## 2020-03-21 DIAGNOSIS — I509 Heart failure, unspecified: Secondary | ICD-10-CM | POA: Diagnosis not present

## 2020-03-21 DIAGNOSIS — Z8711 Personal history of peptic ulcer disease: Secondary | ICD-10-CM | POA: Diagnosis not present

## 2020-03-21 DIAGNOSIS — R0600 Dyspnea, unspecified: Secondary | ICD-10-CM

## 2020-03-21 DIAGNOSIS — F419 Anxiety disorder, unspecified: Secondary | ICD-10-CM | POA: Diagnosis not present

## 2020-03-21 DIAGNOSIS — Z7982 Long term (current) use of aspirin: Secondary | ICD-10-CM | POA: Diagnosis not present

## 2020-03-21 DIAGNOSIS — K219 Gastro-esophageal reflux disease without esophagitis: Secondary | ICD-10-CM | POA: Diagnosis not present

## 2020-03-21 DIAGNOSIS — I2510 Atherosclerotic heart disease of native coronary artery without angina pectoris: Secondary | ICD-10-CM | POA: Diagnosis not present

## 2020-03-21 DIAGNOSIS — F329 Major depressive disorder, single episode, unspecified: Secondary | ICD-10-CM | POA: Insufficient documentation

## 2020-03-21 DIAGNOSIS — I429 Cardiomyopathy, unspecified: Secondary | ICD-10-CM | POA: Diagnosis not present

## 2020-03-21 DIAGNOSIS — R0609 Other forms of dyspnea: Secondary | ICD-10-CM

## 2020-03-21 NOTE — Progress Notes (Signed)
SARS-CoV-2 test is negative.  This is good news.  Proceed forward with work-up as planned.  Marci Polito, FNP 

## 2020-03-22 ENCOUNTER — Telehealth: Payer: Self-pay | Admitting: *Deleted

## 2020-03-22 NOTE — Telephone Encounter (Signed)
ICD transmission received showing AF episode on 03/18/20, duration 5hr 31min. Challenging to discern discrete P-waves, appears true AF. Not on [facility]. AF previously noted on 2/1/2, 1hr 45min duration with plan for watchful waiting at that time per Dr. Curt Bears. Routed to Dr. Curt Bears for review and recommendations.    CHA2DS2-VASc score of 2 (CHF, HTN) per phone note from A. Tillery, PA-C on 12/17/19.

## 2020-03-23 ENCOUNTER — Telehealth: Payer: Self-pay | Admitting: Internal Medicine

## 2020-03-23 NOTE — Telephone Encounter (Signed)
Called and spoke with patient let him know the results of his covid test.   Patient voiced understanding Nothing further needed

## 2020-03-24 NOTE — Telephone Encounter (Signed)
Spoke to pt, informed of transmission findings. General education of AFib given/discussed w/ pt. Aware Dr. Curt Bears recommends f/u in our [facility] to further educate pt to AFib and determine if blood thinner needs to be started. Pt aware [facility] will contact him and arrange OV next week. Patient verbalized understanding and agreeable to plan.

## 2020-03-24 NOTE — Telephone Encounter (Signed)
Called and spoke with patient.  He is aware of appt 03/28/20 @11  am with Adline Peals, PA.

## 2020-03-28 ENCOUNTER — Encounter (HOSPITAL_COMMUNITY): Payer: Self-pay | Admitting: Physician Assistant

## 2020-03-28 ENCOUNTER — Other Ambulatory Visit: Payer: Self-pay

## 2020-03-28 ENCOUNTER — Ambulatory Visit (HOSPITAL_COMMUNITY)
Admission: RE | Admit: 2020-03-28 | Discharge: 2020-03-28 | Disposition: A | Discharge status: Home / Self Care | Payer: Medicare HMO | Source: Ambulatory Visit | Attending: Physician Assistant | Admitting: Physician Assistant

## 2020-03-28 VITALS — BP 100/62 | HR 56 | Ht 72.0 in | Wt 231.0 lb

## 2020-03-28 DIAGNOSIS — Z8711 Personal history of peptic ulcer disease: Secondary | ICD-10-CM | POA: Insufficient documentation

## 2020-03-28 DIAGNOSIS — I2510 Atherosclerotic heart disease of native coronary artery without angina pectoris: Secondary | ICD-10-CM | POA: Diagnosis not present

## 2020-03-28 DIAGNOSIS — Z8249 Family history of ischemic heart disease and other diseases of the circulatory system: Secondary | ICD-10-CM | POA: Insufficient documentation

## 2020-03-28 DIAGNOSIS — Z951 Presence of aortocoronary bypass graft: Secondary | ICD-10-CM | POA: Insufficient documentation

## 2020-03-28 DIAGNOSIS — N189 Chronic kidney disease, unspecified: Secondary | ICD-10-CM | POA: Diagnosis not present

## 2020-03-28 DIAGNOSIS — Z87891 Personal history of nicotine dependence: Secondary | ICD-10-CM | POA: Diagnosis not present

## 2020-03-28 DIAGNOSIS — E669 Obesity, unspecified: Secondary | ICD-10-CM | POA: Insufficient documentation

## 2020-03-28 DIAGNOSIS — Z833 Family history of diabetes mellitus: Secondary | ICD-10-CM | POA: Insufficient documentation

## 2020-03-28 DIAGNOSIS — D6869 Other thrombophilia: Secondary | ICD-10-CM | POA: Insufficient documentation

## 2020-03-28 DIAGNOSIS — Z7901 Long term (current) use of anticoagulants: Secondary | ICD-10-CM | POA: Insufficient documentation

## 2020-03-28 DIAGNOSIS — F419 Anxiety disorder, unspecified: Secondary | ICD-10-CM | POA: Insufficient documentation

## 2020-03-28 DIAGNOSIS — I480 Paroxysmal atrial fibrillation: Secondary | ICD-10-CM | POA: Diagnosis not present

## 2020-03-28 DIAGNOSIS — K219 Gastro-esophageal reflux disease without esophagitis: Secondary | ICD-10-CM | POA: Diagnosis not present

## 2020-03-28 DIAGNOSIS — Z79899 Other long term (current) drug therapy: Secondary | ICD-10-CM | POA: Diagnosis not present

## 2020-03-28 DIAGNOSIS — G4733 Obstructive sleep apnea (adult) (pediatric): Secondary | ICD-10-CM | POA: Diagnosis not present

## 2020-03-28 DIAGNOSIS — I5022 Chronic systolic (congestive) heart failure: Secondary | ICD-10-CM | POA: Insufficient documentation

## 2020-03-28 DIAGNOSIS — Z95810 Presence of automatic (implantable) cardiac defibrillator: Secondary | ICD-10-CM | POA: Insufficient documentation

## 2020-03-28 DIAGNOSIS — Z8701 Personal history of pneumonia (recurrent): Secondary | ICD-10-CM | POA: Diagnosis not present

## 2020-03-28 DIAGNOSIS — Z6831 Body mass index (BMI) 31.0-31.9, adult: Secondary | ICD-10-CM | POA: Diagnosis not present

## 2020-03-28 DIAGNOSIS — I428 Other cardiomyopathies: Secondary | ICD-10-CM | POA: Diagnosis not present

## 2020-03-28 DIAGNOSIS — E785 Hyperlipidemia, unspecified: Secondary | ICD-10-CM | POA: Diagnosis not present

## 2020-03-28 DIAGNOSIS — Z7982 Long term (current) use of aspirin: Secondary | ICD-10-CM | POA: Insufficient documentation

## 2020-03-28 DIAGNOSIS — I130 Hypertensive heart and chronic kidney disease with heart failure and stage 1 through stage 4 chronic kidney disease, or unspecified chronic kidney disease: Secondary | ICD-10-CM | POA: Insufficient documentation

## 2020-03-28 DIAGNOSIS — Z86010 Personal history of colonic polyps: Secondary | ICD-10-CM | POA: Insufficient documentation

## 2020-03-28 DIAGNOSIS — E1122 Type 2 diabetes mellitus with diabetic chronic kidney disease: Secondary | ICD-10-CM | POA: Diagnosis not present

## 2020-03-28 HISTORY — DX: Paroxysmal atrial fibrillation: I48.0

## 2020-03-28 MED ORDER — APIXABAN 5 MG PO TABS
5.0000 mg | ORAL_TABLET | Freq: Two times a day (BID) | ORAL | 3 refills | Status: DC
Start: 2020-03-28 — End: 2020-08-31

## 2020-03-28 NOTE — Patient Instructions (Signed)
Start Eliquis 5mg twice a day 

## 2020-03-28 NOTE — Progress Notes (Signed)
Primary Care Physician: Caren Macadam, MD Primary Cardiologist: Dr [name] Primary Electrophysiologist: Dr Curt Bears Referring Physician: Dr Ples Specter Mccaffrey is a 59 y.o. male with a history of NICM s/p ICD, OSA, HTN, CAD s/p CABG 2007, DM, CKD, and paroxysmal atrial fibrillation who presents for follow up in the [facility]. The patient was initially diagnosed with atrial fibrillation 12/17/19 after an hour episode was noted on his ICD. Watchful waiting was recommended at that point. The device clinic received another alert 03/22/20 for a 5 hour episode of afib. Patient has a CHADS2VASC score of 4. Patient was asleep during the episode. Patient did have a CPX 03/21/20 which showed worsening HF limitation. He states he is SOB constantly.   Today, he denies symptoms of palpitations, chest pain, orthopnea, PND, lower extremity edema, dizziness, presyncope, syncope, snoring, daytime somnolence, bleeding, or neurologic sequela. The patient is tolerating medications without difficulties and is otherwise without complaint today.    Atrial Fibrillation Risk Factors:  he does have symptoms or diagnosis of sleep apnea. he is compliant with BiPAP therapy. he does not have a history of rheumatic fever. he does not have a history of alcohol use. The patient does not have a history of early familial atrial fibrillation or other arrhythmias.  he has a BMI of Body mass index is 31.33 kg/m.Marland Kitchen Filed Weights   03/28/20 1112  Weight: 104.8 kg    Family History  Problem Relation Age of Onset  . Hypertension Mother   . Osteoarthritis Mother   . Heart failure Father 46  . High blood pressure Father   . Lung cancer Other   . Cancer Maternal Grandmother        uncertain type  . Bipolar disorder Daughter   . Diabetes Daughter   . Cancer Maternal Uncle        uncertain type  . Diabetes Niece   . Asthma Neg Hx      Atrial Fibrillation Management  history:  Previous antiarrhythmic drugs: none Previous cardioversions: none Previous ablations: none CHADS2VASC score: 4 Anticoagulation history: none   Past Medical History:  Diagnosis Date  . AICD (automatic cardioverter/defibrillator) present   . Anxiety   . Cardiomyopathy    Nonischemic. EF has been about 45%. S/P CABG.;  b.  Echo 4/14: EF 25%, global HK with inf and mid apical AK, restrictive physiology with E/e' > 15 (elevated LV filling pressure), trivial AI/MR, mild to mod LAE, mild RVE, mild reduced RVSF, mild RAE, mod TR, PASP 74 (severe pulmonary HTN)   . CHF (congestive heart failure) ([place])   . CHF exacerbation ([place]) 07/31/2016  . Colon polyps 2002  . Coronary artery disease   . Depression   . Dyslipidemia    hx (03/20/2017)  . GERD (gastroesophageal reflux disease)   . History of bleeding peptic ulcer   . History of echocardiogram    Echo 10/16:  EF 25-30%, poss non-compaction, diff HK with inf-lat and apical HK, restrictive physio, mild AI, severe LAE, mild RVE with mild reduced RVSF, PASP 42 mmHg  . Hyperplastic rectal polyp   . OSA on CPAP    Mild  . Pneumonia   . Tobacco abuse    Remote   Past Surgical History:  Procedure Laterality Date  . CARDIAC CATHETERIZATION  2007;  2009  . CARDIAC DEFIBRILLATOR PLACEMENT  03/20/2017  . COLONOSCOPY WITH PROPOFOL N/A 01/21/2019   Procedure: COLONOSCOPY WITH PROPOFOL;  Surgeon: Milus Banister, MD;  Location: [facility];  Service: Endoscopy;  Laterality: N/A;  . CORONARY ARTERY BYPASS GRAFT  2007   Had a left main dissection after catheterization. Underwent a saphenous vein graft to the LAD and a saphenous vein graft to obtuse marginal.  . ICD IMPLANT N/A 03/20/2017   Procedure: ICD Implant;  Surgeon: Constance Haw, MD;  Location: [facility];  Service: Cardiovascular;  Laterality: N/A;  . PATELLAR TENDON REPAIR Right 2005  . POLYPECTOMY  01/21/2019   Procedure: POLYPECTOMY;  Surgeon: Milus Banister, MD;   Location: [facility];  Service: Endoscopy;;    Current Outpatient Medications  Medication Sig Dispense Refill  . ACCU-CHEK GUIDE test strip     . Accu-Chek Softclix Lancets lancets     . aspirin EC 81 MG tablet Take 81 mg by mouth daily.    . blood glucose meter kit and supplies KIT Dispense based on patient and insurance preference. Use up to four times daily as directed. 1 each 0  . carvedilol (COREG) 12.5 MG tablet Take 1 tablet (12.5 mg total) by mouth 2 (two) times daily. 60 tablet 11  . Multiple Vitamin (MULTIVITAMIN WITH MINERALS) TABS tablet Take 1 tablet by mouth daily.    Marland Kitchen omeprazole (PRILOSEC) 40 MG capsule Take 1 capsule (40 mg total) by mouth daily. 30 capsule 5  . simvastatin (ZOCOR) 40 MG tablet Take 1 tablet (40 mg total) by mouth at bedtime. 90 tablet 3  . spironolactone (ALDACTONE) 25 MG tablet TAKE 1/2 (ONE-HALF) TABLET BY MOUTH ONCE DAILY 45 tablet 2  . torsemide (DEMADEX) 20 MG tablet Take 2 tablets (40 mg total) by mouth daily. 60 tablet 6  . apixaban (ELIQUIS) 5 MG TABS tablet Take 1 tablet (5 mg total) by mouth 2 (two) times daily. 60 tablet 3   Current Facility-Administered Medications  Medication Dose Route Frequency Provider Last Rate Last Admin  . methylPREDNISolone acetate (DEPO-MEDROL) injection 40 mg  40 mg Intra-articular Once Hilts, Michael, MD        No Known Allergies  Social History   Socioeconomic History  . Marital status: Divorced    Spouse name: Not on file  . Number of children: 5  . Years of education: Not on file  . Highest education level: Not on file  Occupational History  . Occupation: Civil engineer, contracting    Employer: [employer]  Tobacco Use  . Smoking status: Former Smoker    Packs/day: 0.25    Years: 7.00    Pack years: 1.75  . Smokeless tobacco: Never Used  . Tobacco comment: 03/20/2017 "quit before 2000"  Substance and Sexual Activity  . Alcohol use: Yes    Alcohol/week: 0.0 standard drinks    Comment: twice a month no  preference  . Drug use: Yes    Types: Marijuana    Comment: 03/20/2017 "joint qod"  . Sexual activity: Not Currently  Other Topics Concern  . Not on file  Social History Narrative   Single   Social Determinants of Health   Financial Resource Strain:   . Difficulty of Paying Living Expenses:   Food Insecurity:   . Worried About Charity fundraiser in the Last Year:   . Arboriculturist in the Last Year:   Transportation Needs:   . Film/video editor (Medical):   Marland Kitchen Lack of Transportation (Non-Medical):   Physical Activity:   . Days of Exercise per Week:   . Minutes of Exercise per Session:   Stress:   .  Feeling of Stress :   Social Connections:   . Frequency of Communication with Friends and Family:   . Frequency of Social Gatherings with Friends and Family:   . Attends Religious Services:   . Active Member of Clubs or Organizations:   . Attends Archivist Meetings:   Marland Kitchen Marital Status:   Intimate Partner Violence:   . Fear of Current or Ex-Partner:   . Emotionally Abused:   Marland Kitchen Physically Abused:   . Sexually Abused:      ROS- All systems are reviewed and negative except as per the HPI above.  Physical Exam: Vitals:   03/28/20 1112  BP: 100/62  Pulse: (!) 56  Weight: 104.8 kg  Height: 6' (1.829 m)    GEN- The patient is well appearing obese male, alert and oriented x 3 today.   Head- normocephalic, atraumatic Eyes-  Sclera clear, conjunctiva pink Ears- hearing intact Oropharynx- clear Neck- supple  Lungs- Clear to ausculation bilaterally, normal work of breathing Heart- Regular rate and rhythm, no murmurs, rubs or gallops  GI- soft, NT, ND, + BS Extremities- no clubbing, cyanosis, or edema MS- no significant deformity or atrophy Skin- no rash or lesion Psych- euthymic mood, full affect Neuro- strength and sensation are intact  Wt Readings from Last 3 Encounters:  03/28/20 104.8 kg  03/15/20 104.5 kg  02/11/20 104.1 kg    EKG today  demonstrates SB HR 56, NST, RAD, PR 164, QRS 102, QTc 441  Echo 02/04/20 demonstrated  1. Left ventricular ejection fraction, by estimation, is 15-20%. The left  ventricle has severely decreased function. The left ventricle demonstrates  global hypokinesis. The left ventricular internal cavity size was severely  dilated. Left ventricular  diastolic parameters are indeterminate. Elevated left ventricular  end-diastolic pressure.  2. Right ventricular systolic function is moderately reduced. The right  ventricular size is moderately enlarged. There is severely elevated  pulmonary artery systolic pressure. The estimated right ventricular  systolic pressure is 24.5 mmHg.  3. Left atrial size was moderately dilated.  4. Right atrial size was moderate-severely dilated.  5. The mitral valve is degenerative. Mild mitral valve regurgitation. No  evidence of mitral stenosis.  6. Tricuspid valve regurgitation is severe.  7. The aortic valve is abnormal. Aortic valve regurgitation is mild. No  aortic stenosis is present.  8. The inferior vena cava is dilated in size with <50% respiratory  variability, suggesting right atrial pressure of 15 mmHg.   Epic records are reviewed at length today  CHA2DS2-VASc Score = 4  The patient's score is based upon: CHF History: 1 HTN History: 1 Age : 0 Diabetes History: 1 Stroke History: 0 Vascular Disease History: 1 Gender: 0      ASSESSMENT AND PLAN: 1. Paroxysmal Atrial Fibrillation (ICD10:  I48.0) The patient's CHA2DS2-VASc score is 4, indicating a 4.8% annual risk of stroke.   General education about afib provided and questions answered. We also discussed his stroke risk and the risks and benefits of anticoagulation. Will start Eliquis 5 mg BID. Will continue ASA 81 mg given h/o CABG. Continue coreg 12.5 mg BID  2. Secondary Hypercoagulable State (ICD10:  D68.69) The patient is at significant risk for stroke/thromboembolism based upon his  CHA2DS2-VASc Score of 4.  Start Apixaban (Eliquis).   3. Obesity Body mass index is 31.33 kg/m. Lifestyle modification was discussed at length including regular exercise and weight reduction.  4. Obstructive sleep apnea The importance of adequate treatment of sleep apnea was discussed today  in order to improve our ability to maintain sinus rhythm long term. Followed by Dr Claiborne Billings  5. CAD S/p CABG after LM dissection during LHC. No anginal symptoms.  6. HTN Stable, no changes today.  7. Chronic systolic CHF NICM, s/p ICD Patient has been referred to [facility]. No signs of overt fluid overload today.   Follow up in the AF clinic in one month.    [facility] [address] [place], [place] [address] [phone] 03/28/2020 11:53 AM

## 2020-03-30 DIAGNOSIS — I4891 Unspecified atrial fibrillation: Secondary | ICD-10-CM | POA: Diagnosis not present

## 2020-03-30 DIAGNOSIS — G4733 Obstructive sleep apnea (adult) (pediatric): Secondary | ICD-10-CM | POA: Diagnosis not present

## 2020-03-30 DIAGNOSIS — F1290 Cannabis use, unspecified, uncomplicated: Secondary | ICD-10-CM | POA: Diagnosis not present

## 2020-03-30 DIAGNOSIS — R0609 Other forms of dyspnea: Secondary | ICD-10-CM | POA: Diagnosis not present

## 2020-03-30 DIAGNOSIS — N183 Chronic kidney disease, stage 3 unspecified: Secondary | ICD-10-CM | POA: Diagnosis not present

## 2020-03-30 DIAGNOSIS — Z79899 Other long term (current) drug therapy: Secondary | ICD-10-CM | POA: Diagnosis not present

## 2020-03-30 DIAGNOSIS — I428 Other cardiomyopathies: Secondary | ICD-10-CM | POA: Diagnosis not present

## 2020-03-30 DIAGNOSIS — Z7901 Long term (current) use of anticoagulants: Secondary | ICD-10-CM | POA: Diagnosis not present

## 2020-03-30 DIAGNOSIS — E1122 Type 2 diabetes mellitus with diabetic chronic kidney disease: Secondary | ICD-10-CM | POA: Diagnosis not present

## 2020-03-30 DIAGNOSIS — I5022 Chronic systolic (congestive) heart failure: Secondary | ICD-10-CM | POA: Diagnosis not present

## 2020-04-14 DIAGNOSIS — I5022 Chronic systolic (congestive) heart failure: Secondary | ICD-10-CM | POA: Diagnosis not present

## 2020-04-20 ENCOUNTER — Telehealth: Payer: Medicare HMO

## 2020-04-27 ENCOUNTER — Other Ambulatory Visit: Payer: Self-pay

## 2020-04-27 ENCOUNTER — Ambulatory Visit (HOSPITAL_COMMUNITY)
Admission: RE | Admit: 2020-04-27 | Discharge: 2020-04-27 | Disposition: A | Discharge status: Home / Self Care | Payer: Medicare HMO | Source: Ambulatory Visit | Attending: Physician Assistant | Admitting: Physician Assistant

## 2020-04-27 VITALS — BP 90/62 | HR 47 | Ht 72.0 in | Wt 233.0 lb

## 2020-04-27 DIAGNOSIS — I5022 Chronic systolic (congestive) heart failure: Secondary | ICD-10-CM | POA: Diagnosis not present

## 2020-04-27 DIAGNOSIS — Z95810 Presence of automatic (implantable) cardiac defibrillator: Secondary | ICD-10-CM | POA: Diagnosis not present

## 2020-04-27 DIAGNOSIS — Z951 Presence of aortocoronary bypass graft: Secondary | ICD-10-CM | POA: Diagnosis not present

## 2020-04-27 DIAGNOSIS — N189 Chronic kidney disease, unspecified: Secondary | ICD-10-CM | POA: Insufficient documentation

## 2020-04-27 DIAGNOSIS — E785 Hyperlipidemia, unspecified: Secondary | ICD-10-CM | POA: Diagnosis not present

## 2020-04-27 DIAGNOSIS — I480 Paroxysmal atrial fibrillation: Secondary | ICD-10-CM

## 2020-04-27 DIAGNOSIS — Z79899 Other long term (current) drug therapy: Secondary | ICD-10-CM | POA: Diagnosis not present

## 2020-04-27 DIAGNOSIS — Z87891 Personal history of nicotine dependence: Secondary | ICD-10-CM | POA: Diagnosis not present

## 2020-04-27 DIAGNOSIS — K219 Gastro-esophageal reflux disease without esophagitis: Secondary | ICD-10-CM | POA: Insufficient documentation

## 2020-04-27 DIAGNOSIS — I2510 Atherosclerotic heart disease of native coronary artery without angina pectoris: Secondary | ICD-10-CM | POA: Insufficient documentation

## 2020-04-27 DIAGNOSIS — Z8711 Personal history of peptic ulcer disease: Secondary | ICD-10-CM | POA: Insufficient documentation

## 2020-04-27 DIAGNOSIS — Z7982 Long term (current) use of aspirin: Secondary | ICD-10-CM | POA: Insufficient documentation

## 2020-04-27 DIAGNOSIS — G4733 Obstructive sleep apnea (adult) (pediatric): Secondary | ICD-10-CM | POA: Insufficient documentation

## 2020-04-27 DIAGNOSIS — E669 Obesity, unspecified: Secondary | ICD-10-CM | POA: Diagnosis not present

## 2020-04-27 DIAGNOSIS — Z833 Family history of diabetes mellitus: Secondary | ICD-10-CM | POA: Insufficient documentation

## 2020-04-27 DIAGNOSIS — Z9989 Dependence on other enabling machines and devices: Secondary | ICD-10-CM | POA: Insufficient documentation

## 2020-04-27 DIAGNOSIS — Z7901 Long term (current) use of anticoagulants: Secondary | ICD-10-CM | POA: Diagnosis not present

## 2020-04-27 DIAGNOSIS — I428 Other cardiomyopathies: Secondary | ICD-10-CM | POA: Diagnosis not present

## 2020-04-27 DIAGNOSIS — Z8249 Family history of ischemic heart disease and other diseases of the circulatory system: Secondary | ICD-10-CM | POA: Insufficient documentation

## 2020-04-27 DIAGNOSIS — I130 Hypertensive heart and chronic kidney disease with heart failure and stage 1 through stage 4 chronic kidney disease, or unspecified chronic kidney disease: Secondary | ICD-10-CM | POA: Diagnosis not present

## 2020-04-27 DIAGNOSIS — Z6831 Body mass index (BMI) 31.0-31.9, adult: Secondary | ICD-10-CM | POA: Diagnosis not present

## 2020-04-27 DIAGNOSIS — Z8701 Personal history of pneumonia (recurrent): Secondary | ICD-10-CM | POA: Diagnosis not present

## 2020-04-27 DIAGNOSIS — E1122 Type 2 diabetes mellitus with diabetic chronic kidney disease: Secondary | ICD-10-CM | POA: Diagnosis not present

## 2020-04-27 DIAGNOSIS — D6869 Other thrombophilia: Secondary | ICD-10-CM | POA: Diagnosis not present

## 2020-04-27 LAB — CBC
HCT: 44.9 % (ref 39.0–52.0)
Hemoglobin: 14.1 g/dL (ref 13.0–17.0)
MCH: 27.9 pg (ref 26.0–34.0)
MCHC: 31.4 g/dL (ref 30.0–36.0)
MCV: 88.7 fL (ref 80.0–100.0)
Platelets: 145 10*3/uL — ABNORMAL LOW (ref 150–400)
RBC: 5.06 MIL/uL (ref 4.22–5.81)
RDW: 14.6 % (ref 11.5–15.5)
WBC: 5.2 10*3/uL (ref 4.0–10.5)
nRBC: 0 % (ref 0.0–0.2)

## 2020-04-27 NOTE — Progress Notes (Addendum)
Primary Care Physician: Caren Macadam, MD Primary Cardiologist: Dr [name] Primary Electrophysiologist: Dr Curt Bears Referring Physician: Dr Ples Specter Bruce Robertson is a 59 y.o. male with a history of NICM s/p ICD, OSA, HTN, CAD s/p CABG 2007, DM, CKD, and paroxysmal atrial fibrillation who presents for follow up in the [facility]. The patient was initially diagnosed with atrial fibrillation 12/17/19 after an hour episode was noted on his ICD. Watchful waiting was recommended at that point. The device clinic received another alert 03/22/20 for a 5 hour episode of afib. Patient has a CHADS2VASC score of 4. Patient was asleep during the episode. Patient did have a CPX 03/21/20 which showed worsening HF limitation. He states he is SOB constantly.   On follow up today, patient reports doing well since his last visit. He reports since his medications were adjusted at [facility] he has "felt better than he has in a year." His dyspnea on exertion has dramatically improved. He denies bleeding issues on anticoagulation.   Today, he denies symptoms of palpitations, chest pain, orthopnea, PND, lower extremity edema, dizziness, presyncope, syncope, snoring, daytime somnolence, bleeding, or neurologic sequela. The patient is tolerating medications without difficulties and is otherwise without complaint today.    Atrial Fibrillation Risk Factors:  he does have symptoms or diagnosis of sleep apnea. he is compliant with BiPAP therapy. he does not have a history of rheumatic fever. he does not have a history of alcohol use. The patient does not have a history of early familial atrial fibrillation or other arrhythmias.  he has a BMI of Body mass index is 31.6 kg/m.Marland Kitchen Filed Weights   04/27/20 0933  Weight: 105.7 kg    Family History  Problem Relation Age of Onset  . Hypertension Mother   . Osteoarthritis Mother   . Heart failure Father 58  . High blood pressure  Father   . Lung cancer Other   . Cancer Maternal Grandmother        uncertain type  . Bipolar disorder Daughter   . Diabetes Daughter   . Cancer Maternal Uncle        uncertain type  . Diabetes Niece   . Asthma Neg Hx      Atrial Fibrillation Management history:  Previous antiarrhythmic drugs: none Previous cardioversions: none Previous ablations: none CHADS2VASC score: 4 Anticoagulation history: Eliquis   Past Medical History:  Diagnosis Date  . AICD (automatic cardioverter/defibrillator) present   . Anxiety   . Cardiomyopathy    Nonischemic. EF has been about 45%. S/P CABG.;  b.  Echo 4/14: EF 25%, global HK with inf and mid apical AK, restrictive physiology with E/e' > 15 (elevated LV filling pressure), trivial AI/MR, mild to mod LAE, mild RVE, mild reduced RVSF, mild RAE, mod TR, PASP 74 (severe pulmonary HTN)   . CHF (congestive heart failure) ([place])   . CHF exacerbation ([place]) 07/31/2016  . Colon polyps 2002  . Coronary artery disease   . Depression   . Dyslipidemia    hx (03/20/2017)  . GERD (gastroesophageal reflux disease)   . History of bleeding peptic ulcer   . History of echocardiogram    Echo 10/16:  EF 25-30%, poss non-compaction, diff HK with inf-lat and apical HK, restrictive physio, mild AI, severe LAE, mild RVE with mild reduced RVSF, PASP 42 mmHg  . Hyperplastic rectal polyp   . OSA on CPAP    Mild  . Pneumonia   . Tobacco abuse  Remote   Past Surgical History:  Procedure Laterality Date  . CARDIAC CATHETERIZATION  2007;  2009  . CARDIAC DEFIBRILLATOR PLACEMENT  03/20/2017  . COLONOSCOPY WITH PROPOFOL N/A 01/21/2019   Procedure: COLONOSCOPY WITH PROPOFOL;  Surgeon: Milus Banister, MD;  Location: [facility];  Service: Endoscopy;  Laterality: N/A;  . CORONARY ARTERY BYPASS GRAFT  2007   Had a left main dissection after catheterization. Underwent a saphenous vein graft to the LAD and a saphenous vein graft to obtuse marginal.  . ICD IMPLANT N/A  03/20/2017   Procedure: ICD Implant;  Surgeon: Constance Haw, MD;  Location: [facility];  Service: Cardiovascular;  Laterality: N/A;  . PATELLAR TENDON REPAIR Right 2005  . POLYPECTOMY  01/21/2019   Procedure: POLYPECTOMY;  Surgeon: Milus Banister, MD;  Location: [facility];  Service: Endoscopy;;    Current Outpatient Medications  Medication Sig Dispense Refill  . ACCU-CHEK GUIDE test strip     . Accu-Chek Softclix Lancets lancets     . apixaban (ELIQUIS) 5 MG TABS tablet Take 1 tablet (5 mg total) by mouth 2 (two) times daily. 60 tablet 3  . aspirin EC 81 MG tablet Take 81 mg by mouth daily.    . blood glucose meter kit and supplies KIT Dispense based on patient and insurance preference. Use up to four times daily as directed. 1 each 0  . carvedilol (COREG) 12.5 MG tablet Take 1 tablet (12.5 mg total) by mouth 2 (two) times daily. 60 tablet 11  . Multiple Vitamin (MULTIVITAMIN WITH MINERALS) TABS tablet Take 1 tablet by mouth daily.    Marland Kitchen omeprazole (PRILOSEC) 40 MG capsule Take 1 capsule (40 mg total) by mouth daily. (Patient taking differently: Take 40 mg by mouth as needed. ) 30 capsule 5  . sacubitril-valsartan (ENTRESTO) 24-26 MG Take 1 tablet by mouth in the morning and at bedtime.     . simvastatin (ZOCOR) 20 MG tablet Take 20 mg by mouth daily.     Marland Kitchen spironolactone (ALDACTONE) 25 MG tablet Take 25 mg by mouth daily.     Marland Kitchen torsemide (DEMADEX) 20 MG tablet Take 2 tablets (40 mg total) by mouth daily. 60 tablet 6   Current Facility-Administered Medications  Medication Dose Route Frequency Provider Last Rate Last Admin  . methylPREDNISolone acetate (DEPO-MEDROL) injection 40 mg  40 mg Intra-articular Once Hilts, Michael, MD        No Known Allergies  Social History   Socioeconomic History  . Marital status: Divorced    Spouse name: Not on file  . Number of children: 5  . Years of education: Not on file  . Highest education level: Not on file  Occupational  History  . Occupation: Civil engineer, contracting    Employer: [employer]  Tobacco Use  . Smoking status: Former Smoker    Packs/day: 0.25    Years: 7.00    Pack years: 1.75  . Smokeless tobacco: Never Used  . Tobacco comment: 03/20/2017 "quit before 2000"  Vaping Use  . Vaping Use: Former  . Devices: vaped for about a month  Substance and Sexual Activity  . Alcohol use: Yes    Alcohol/week: 0.0 standard drinks    Comment: twice a month no preference  . Drug use: Yes    Types: Marijuana    Comment: 03/20/2017 "joint qod"  . Sexual activity: Not Currently  Other Topics Concern  . Not on file  Social History Narrative   Single  Social Determinants of Health   Financial Resource Strain:   . Difficulty of Paying Living Expenses:   Food Insecurity:   . Worried About Charity fundraiser in the Last Year:   . Arboriculturist in the Last Year:   Transportation Needs:   . Film/video editor (Medical):   Marland Kitchen Lack of Transportation (Non-Medical):   Physical Activity:   . Days of Exercise per Week:   . Minutes of Exercise per Session:   Stress:   . Feeling of Stress :   Social Connections:   . Frequency of Communication with Friends and Family:   . Frequency of Social Gatherings with Friends and Family:   . Attends Religious Services:   . Active Member of Clubs or Organizations:   . Attends Archivist Meetings:   Marland Kitchen Marital Status:   Intimate Partner Violence:   . Fear of Current or Ex-Partner:   . Emotionally Abused:   Marland Kitchen Physically Abused:   . Sexually Abused:      ROS- All systems are reviewed and negative except as per the HPI above.  Physical Exam: Vitals:   04/27/20 0933  BP: 90/62  Pulse: (!) 47  Weight: 105.7 kg  Height: 6' (1.829 m)    GEN- The patient is well appearing obese male, alert and oriented x 3 today.   HEENT-head normocephalic, atraumatic, sclera clear, conjunctiva pink, hearing intact, trachea midline. Lungs- Clear to ausculation  bilaterally, normal work of breathing Heart- Regular rate and rhythm, bradycardia, no murmurs, rubs or gallops  GI- soft, NT, ND, + BS Extremities- no clubbing, cyanosis, or edema MS- no significant deformity or atrophy Skin- no rash or lesion Psych- euthymic mood, full affect Neuro- strength and sensation are intact   Wt Readings from Last 3 Encounters:  04/27/20 105.7 kg  03/28/20 104.8 kg  03/15/20 104.5 kg    EKG today demonstrates SB HR 47, RAD, PR 180, QRS 104, QTc 424  Echo 02/04/20 demonstrated  1. Left ventricular ejection fraction, by estimation, is 15-20%. The left  ventricle has severely decreased function. The left ventricle demonstrates  global hypokinesis. The left ventricular internal cavity size was severely  dilated. Left ventricular  diastolic parameters are indeterminate. Elevated left ventricular  end-diastolic pressure.  2. Right ventricular systolic function is moderately reduced. The right  ventricular size is moderately enlarged. There is severely elevated  pulmonary artery systolic pressure. The estimated right ventricular  systolic pressure is 10.1 mmHg.  3. Left atrial size was moderately dilated.  4. Right atrial size was moderate-severely dilated.  5. The mitral valve is degenerative. Mild mitral valve regurgitation. No  evidence of mitral stenosis.  6. Tricuspid valve regurgitation is severe.  7. The aortic valve is abnormal. Aortic valve regurgitation is mild. No  aortic stenosis is present.  8. The inferior vena cava is dilated in size with <50% respiratory  variability, suggesting right atrial pressure of 15 mmHg.   Epic records are reviewed at length today  CHA2DS2-VASc Score = 4  The patient's score is based upon: CHF History: 1 HTN History: 1 Age : 0 Diabetes History: 1 Stroke History: 0 Vascular Disease History: 1 Gender: 0      ASSESSMENT AND PLAN: 1. Paroxysmal Atrial Fibrillation (ICD10:  I48.0) The patient's  CHA2DS2-VASc score is 4, indicating a 4.8% annual risk of stroke.   Patient appears to be maintaining SR. Continue Eliquis 5 mg BID. Check CBC today. Continue coreg 12.5 mg BID  2.  Secondary Hypercoagulable State (ICD10:  D68.69) The patient is at significant risk for stroke/thromboembolism based upon his CHA2DS2-VASc Score of 4.  Start Apixaban (Eliquis).   3. Obesity Body mass index is 31.6 kg/m. Lifestyle modification was discussed and encouraged including regular physical activity and weight reduction. Patient has been going to the gym regularly again.   4. Obstructive sleep apnea The importance of adequate treatment of sleep apnea was discussed today in order to improve our ability to maintain sinus rhythm long term. Followed by Dr Claiborne Billings.  5. CAD S/p CABG after LM dissection during LHC. No anginal symptoms.  6. HTN Stable, no changes today.  7. Chronic systolic CHF NICM, s/p ICD Patient seen [facility] (Dr Posey Pronto) No signs or symptoms of fluid overload today.  8. Bradycardia Asymptomatic. Would not decrease BB at this time with his severely decreased EF.   Follow up with Dr Curt Bears per recall and [facility] as scheduled. AF clinic as needed.    [facility] [address] [place], [place] [address] [phone] 04/27/2020 10:16 AM

## 2020-05-04 NOTE — Chronic Care Management (AMB) (Signed)
Chronic Care Management Pharmacy  Name: Bruce Robertson  MRN: [id number] DOB: September 17, 1961  Chief Complaint/ HPI  Bruce Robertson,  59 y.o. , male presents for their Initial CCM visit with the clinical pharmacist via telephone due to COVID-19 Pandemic. Patient is on permanent disability. Patient has not been doing any physical activity due to declining health. Patient has started going back to the gym a week ago and tries to do both cardio and weight lifting at least 3-4 days a week for 45 mins. Patient is living with fiance and has been engaged for about year now. He says he eats a lot of fish and salad and eats chicken and steak from time to time. Does not go out to eat and no fast foods. He uses DASH as salt alternative for seasoning. His main concern today is his overall heart health and how can he prevent further damage.  PCP : Caren Macadam, MD  Their chronic conditions include: HTN, HLD, cardiomyopathy, stage 3 CKD, systolic heart failure, pre-diabetes, paroxysmal Afib  Office Visits: 02/11/20 OV - cough with black sputum. Ordered blood work and CXR. HTN well controlled. HLD has been well controlled. Cardiomyopathy evaluating and followed by cardio.   Consult Visit: None  Medications: Outpatient Encounter Medications as of 05/09/2020  Medication Sig  . ACCU-CHEK GUIDE test strip   . Accu-Chek Softclix Lancets lancets   . apixaban (ELIQUIS) 5 MG TABS tablet Take 1 tablet (5 mg total) by mouth 2 (two) times daily.  Marland Kitchen aspirin EC 81 MG tablet Take 81 mg by mouth daily.  . blood glucose meter kit and supplies KIT Dispense based on patient and insurance preference. Use up to four times daily as directed.  . carvedilol (COREG) 12.5 MG tablet Take 1 tablet (12.5 mg total) by mouth 2 (two) times daily.  . Multiple Vitamin (MULTIVITAMIN WITH MINERALS) TABS tablet Take 1 tablet by mouth daily.  . sacubitril-valsartan (ENTRESTO) 24-26 MG Take 1 tablet by mouth in the morning and at bedtime.    . simvastatin (ZOCOR) 20 MG tablet Take 20 mg by mouth daily.   Marland Kitchen spironolactone (ALDACTONE) 25 MG tablet Take 25 mg by mouth daily.   Marland Kitchen omeprazole (PRILOSEC) 40 MG capsule Take 1 capsule (40 mg total) by mouth daily. (Patient not taking: Reported on 05/09/2020)  . torsemide (DEMADEX) 20 MG tablet Take 2 tablets (40 mg total) by mouth daily.   Facility-Administered Encounter Medications as of 05/09/2020  Medication  . methylPREDNISolone acetate (DEPO-MEDROL) injection 40 mg     Transportation Needs: No Transportation Needs  . Lack of Transportation (Medical): No  . Lack of Transportation (Non-Medical): No     Physical Activity: Sufficiently Active  . Days of Exercise per Week: 4 days  . Minutes of Exercise per Session: 40 min    Current Diagnosis/Assessment:  Goals Addressed            This Visit's Progress   . Chronic Care Management       CARE PLAN ENTRY  Current Barriers:  . Chronic Disease Management support, education, and care coordination needs related to hypertension, hyperlipidemia, and heart failure   Hypertension/Heart Failure BP Readings from Last 3 Encounters:  04/27/20 90/62  03/28/20 100/62  03/15/20 116/70   . Pharmacist Clinical Goal(s): o Over the next 90 days, patient will work with PharmD and providers to maintain BP goal <130/80 . Current regimen:  . Carvedilol 12.5 mg 1 tablet twice daily . Spironolactone 25 mg 1 tablet daily .  Torsemide 20 mg 2 tablets daily . Entresto 24-26 mg 1 tablet AM and HS . Interventions: o Discussed low salt diet and exercising as tolerated extensively . Patient self care activities - Over the next 90 days, patient will: o Check BP twice daily, document, and provide at future appointments o Ensure daily salt intake < 2300 mg/day o Check weight daily  Hyperlipidemia Lab Results  Component Value Date/Time   LDLCALC 92 02/11/2020 04:41 PM   LDLDIRECT 166.4 05/17/2010 09:05 AM   . Pharmacist Clinical  Goal(s): o Over the next 90 days, patient will work with PharmD and providers to maintain LDL goal < 70 . Current regimen:  o Simvastatin 20 mg 1 tablet daily . Interventions: o Discussed low cholesterol diet and exercising as tolerated extensively . Patient self care activities - Over the next 90 days, patient will: o Continue with diet/weight management and exercise  Medication management . Pharmacist Clinical Goal(s): o Over the next 90 days, patient will work with PharmD and providers to maintain optimal medication adherence . Current pharmacy: [facility] . Interventions o Comprehensive medication review performed. o Continue current medication management strategy . Patient self care activities - Over the next 90 days, patient will: o Focus on medication adherence by using weekly pill organizers o Take medications as prescribed o Report any questions or concerns to PharmD and/or provider(s)  Initial goal documentation        AFIB   Patient is currently rate controlled. HR 45-48 BPM  Patient has failed these meds in past: none Patient is currently controlled on the following medications:   Carvedilol 12.5 mg 1 tablet twice daily  Eliquis 5 mg 1 tablet twice daily  Patient reports heart rate being between 45-48 most of the time. He is well controlled on his medications. Explained to him the use of his medications and their importance. Counseled on severe bleeding/brusing as side effects for Eliquis.   Plan  Continue current medications and control with diet and exercise   Heart Failure   Type: Combined Systolic and Diastolic  Last ejection fraction: 15-20% 4/21 NYHA Class: IV (significant limitation of activity / dyspnea at rest) AHA HF Stage: D (Advanced disease requiring aggressive medical therapy)  Patient has failed these meds in past: lisinopril, losartan, isosorbide mononitrate, furosemide, carvedilol Patient is currently controlled on the following  medications:   Carvedilol 12.5 mg 1 tablet twice daily  Entresto 24-26 mg 1 tablet AM and HS  Spironolactone 25 mg 1 tablet daily  Torsemide 20 mg 2 tablets daily  HF controlled at 15-20% EF ECHO 4/21. Counseled patient to exercise as tolerated and continue DASH diet and limit salt intake. Educated patient on importance of each medication and to continue taking them as prescribed. Patient reports having shortness of breath even at rest.  Patient is now followed by [facility].  Plan  Continue control with diet and exercise   Hypertension   BP today is:  <130/80  Office blood pressures are  BP Readings from Last 3 Encounters:  04/27/20 90/62  03/28/20 100/62  03/15/20 116/70    Patient has failed these meds in the past:  lisinopril, losartan, isosorbide mononitrate, furosemide, carvedilol Patient is currently controlled on the following medications:  . Carvedilol 12.5 mg 1 tablet twice daily . Spironolactone 25 mg 1 tablet daily . Torsemide 20 mg 2 tablets daily  Patient checks BP at home twice daily  Patient home BP readings are ranging: 85/60  BP stable and well controlled. Advised the  patient to monitor blood pressure routinely and if blood pressure is too low (lower than 80 systolic) to hydrate and be vigilant if he needs to call 911. We discussed diet and exercise extensively and how this helps his heart.   Plan  Continue current medications and control with diet and exercise      Hyperlipidemia   Lipid Panel     Component Value Date/Time   CHOL 161 02/11/2020 1641   TRIG 135 02/11/2020 1641   HDL 46 02/11/2020 1641   CHOLHDL 3.5 02/11/2020 1641   VLDL 37.2 11/25/2018 1640   LDLCALC 92 02/11/2020 1641   LDLDIRECT 166.4 05/17/2010 0905     The 10-year ASCVD risk score (Goff DC Jr., et al., 2013) is: 11.2%   Values used to calculate the score:     Age: 34 years     Sex: Male     Is Non-Hispanic African American: Yes     Diabetic: Yes     Tobacco smoker:  No     Systolic Blood Pressure: 90 mmHg     Is BP treated: Yes     HDL Cholesterol: 46 mg/dL     Total Cholesterol: 161 mg/dL   Patient has failed these meds in past: atorvastatin Patient is currently controlled on the following medications:  . Simvastatin 20 mg 1 tablet daily  Cholesterol at goal. Patient reports eating healthier and does DASH diet. Patient just got back to the gym and trying to exercise at least 3-4 days/week for 45 mins. Reports eating fish and salad most of the time and denies eating out.  Plan  Continue current medications and control with diet and exercise   Diabetes   Recent Relevant Labs: Lab Results  Component Value Date/Time   HGBA1C 6.6 (H) 02/11/2020 04:41 PM   HGBA1C 6.8 (H) 03/04/2019 10:10 AM   GFR 55.54 (L) 03/04/2019 10:10 AM   GFR 54.39 (L) 11/25/2018 04:40 PM   MICROALBUR 0.4 02/11/2020 04:41 PM   MICROALBUR 3.3 (H) 03/04/2019 10:10 AM    Checking BG: Rarely  Patient has failed these meds in past: None Patient is currently controlled on the following medications:  . Diet controlled  Last diabetic Eye exam: No results found for: HMDIABEYEEXA  Last diabetic Foot exam: No results found for: HMDIABFOOTEX   A1c is within goal. Encouraged patient to continue with lifestyle modifications.   Plan  Continue control with diet and exercise   GERD   Patient has failed these meds in past: dexlansoprazole, pantoprazole, sucralfate Patient is currently controlled on the following medications:  . Omeprazole 40 mg 1 capsule daily  Patient reports not taking omeprazole now since he has not experience any reflux or heartburn even after quitting it. Counseled to take famotidine PRN when reflux episodes occur.  Plan  Recommended stop taking Omeprazole daily Recommend to start taking famotidine PRN   OTCs/Health Maintenance   Patient is currently controlled on the following medications: . AccuChek Guide test strips use up to 4 times  daily . AccuChek Softclix lancets use up to 4 times daily . Aspirin EC 81 mg 1 tablet daily . Multivitamin 1 tablet daily  Plan  Continue current medications     Vaccines   Reviewed and discussed patient's vaccination history.    Immunization History  Administered Date(s) Administered  . Influenza,inj,Quad PF,6+ Mos 11/25/2018  . Pneumococcal Conjugate-13 11/25/2018    Plan  Recommended patient receive pneumovax 23 and COVID vaccines at the pharmacy/office.    Medication  Management   Pharmacy/Benefits: [facility] / [facility] Adherence: Fiance helps with the medications Pt endorses 100% compliance  We discussed the benefits of [facility] pharmacy and medication synchronization and delivery, but tpatient just lives 5 minutes away from [place] and he mentions it being convenient for him currently.  Plan  Continue current medication management strategy   Follow up: 3 month phone visit   Geraldine Contras, PharmD Clinical Pharmacist [facility] [phone]

## 2020-05-09 ENCOUNTER — Other Ambulatory Visit: Payer: Self-pay

## 2020-05-09 ENCOUNTER — Ambulatory Visit: Payer: Medicare HMO | Admitting: Pharmacist

## 2020-05-09 DIAGNOSIS — E782 Mixed hyperlipidemia: Secondary | ICD-10-CM

## 2020-05-09 DIAGNOSIS — I10 Essential (primary) hypertension: Secondary | ICD-10-CM

## 2020-05-09 NOTE — Patient Instructions (Addendum)
Visit Information  Thank you for meeting with me to discuss your medications! I look forward to working with you to achieve your health care goals. Below is a summary of what we talked about during the visit:  Goals Addressed            This Visit's Progress   . Chronic Care Management       CARE PLAN ENTRY  Current Barriers:  . Chronic Disease Management support, education, and care coordination needs related to hypertension, hyperlipidemia, and heart failure   Hypertension/Heart Failure BP Readings from Last 3 Encounters:  04/27/20 90/62  03/28/20 100/62  03/15/20 116/70   . Pharmacist Clinical Goal(s): o Over the next 90 days, patient will work with PharmD and providers to maintain BP goal <130/80 . Current regimen:  . Carvedilol 12.5 mg 1 tablet twice daily . Spironolactone 25 mg 1 tablet daily . Torsemide 20 mg 2 tablets daily . Entresto 24-26 mg 1 tablet AM and HS . Interventions: o Discussed low salt diet and exercising as tolerated extensively . Patient self care activities - Over the next 90 days, patient will: o Check BP twice daily, document, and provide at future appointments o Ensure daily salt intake < 2300 mg/day o Check weight daily  Hyperlipidemia Lab Results  Component Value Date/Time   LDLCALC 92 02/11/2020 04:41 PM   LDLDIRECT 166.4 05/17/2010 09:05 AM   . Pharmacist Clinical Goal(s): o Over the next 90 days, patient will work with PharmD and providers to maintain LDL goal < 70 . Current regimen:  o Simvastatin 20 mg 1 tablet daily . Interventions: o Discussed low cholesterol diet and exercising as tolerated extensively . Patient self care activities - Over the next 90 days, patient will: o Continue with diet/weight management and exercise  Medication management . Pharmacist Clinical Goal(s): o Over the next 90 days, patient will work with PharmD and providers to maintain optimal medication adherence . Current pharmacy:  [facility] . Interventions o Comprehensive medication review performed. o Continue current medication management strategy . Patient self care activities - Over the next 90 days, patient will: o Focus on medication adherence by using weekly pill organizers o Take medications as prescribed o Report any questions or concerns to PharmD and/or provider(s)  Initial goal documentation        Mr. Claud was given information about Chronic Care Management services today including:  1. CCM service includes personalized support from designated clinical staff supervised by his physician, including individualized plan of care and coordination with other care providers 2. 24/7 contact phone numbers for assistance for urgent and routine care needs. 3. Standard insurance, coinsurance, copays and deductibles apply for chronic care management only during months in which we provide at least 20 minutes of these services. Most insurances cover these services at 100%, however patients may be responsible for any copay, coinsurance and/or deductible if applicable. This service may help you avoid the need for more expensive face-to-face services. 4. Only one practitioner may furnish and bill the service in a calendar month. 5. The patient may stop CCM services at any time (effective at the end of the month) by phone call to the office staff.  Patient agreed to services and verbal consent obtained.   The patient verbalized understanding of instructions provided today and agreed to receive a mailed copy of patient instruction and/or educational materials. Telephone follow up appointment with pharmacy team member scheduled for: 08/10/20 @ 4 PM . Geraldine Contras, PharmD Clinical Pharmacist [facility]    Heart Failure, Self Care Heart failure is a serious condition. This sheet explains things you need to do to take care of yourself at home. To help you stay as healthy as  possible, you may be asked to change your diet, take certain medicines, and make other changes in your life. Your doctor may also give you more specific instructions. If you have problems or questions, call your doctor. What are the risks? Having heart failure makes it more likely for you to have some problems. These problems can get worse if you do not take good care of yourself. Problems may include:  Blood clotting problems. This may cause a stroke.  Damage to the kidneys, liver, or lungs.  Abnormal heart rhythms. Supplies needed:  Scale for weighing yourself.  Blood pressure monitor.  Notebook.  Medicines. How to care for yourself when you have heart failure Medicines Take over-the-counter and prescription medicines only as told by your doctor. Take your medicines every day.  Do not stop taking your medicine unless your doctor tells you to do so.  Do not skip any medicines.  Get your prescriptions refilled before you run out of medicine. This is important. Eating and drinking   Eat heart-healthy foods. Talk with a diet specialist (dietitian) to create an eating plan.  Choose foods that: ? Have no trans fat. ? Are low in saturated fat and cholesterol.  Choose healthy foods, such as: ? Fresh or frozen fruits and vegetables. ? Fish. ? Low-fat (lean) meats. ? Legumes, such as beans, peas, and lentils. ? Fat-free or low-fat dairy products. ? Whole-grain foods. ? High-fiber foods.  Limit salt (sodium) if told by your doctor. Ask your diet specialist to tell you which seasonings are healthy for your heart.  Cook in healthy ways instead of frying. Healthy ways of cooking include roasting, grilling, broiling, baking, poaching, steaming, and stir-frying.  Limit how much fluid you drink, if told by your doctor. Alcohol use  Do not drink alcohol if: ? Your doctor tells you not to drink. ? Your heart was damaged by alcohol, or you have very bad heart failure. ? You are  pregnant, may be pregnant, or are planning to become pregnant.  If you drink alcohol: ? Limit how much you use to:  0-1 drink a day for women.  0-2 drinks a day for men. ? Be aware of how much alcohol is in your drink. In the [place] one drink equals one 12 oz bottle of beer (355 mL), one 5 oz glass of wine (148 mL), or one 1 oz glass of hard liquor (44 mL). Lifestyle   Do not use any products that contain nicotine or tobacco, such as cigarettes, e-cigarettes, and chewing tobacco. If you need help quitting, ask your doctor. ? Do not use nicotine gum or patches before talking to your doctor.  Do not use illegal drugs.  Lose weight if told by your doctor.  Do physical activity if told by your doctor. Talk to your doctor before you begin an exercise if: ? You are an older adult. ? You have very bad heart failure.  Learn to manage stress. If you need help, ask your doctor.  Get rehab (rehabilitation) to help you stay independent and to help with your quality of life.  Plan time to rest when you get tired. Check weight and blood pressure   Weigh yourself every day. This will help you to know if fluid is building  up in your body. ? Weigh yourself every morning after you pee (urinate) and before you eat breakfast. ? Wear the same amount of clothing each time. ? Write down your daily weight. Give your record to your doctor.  Check and write down your blood pressure as told by your doctor.  Check your pulse as told by your doctor. Dealing with very hot and very cold weather  If it is very hot: ? Avoid activities that take a lot of energy. ? Use air conditioning or fans, or find a cooler place. ? Avoid caffeine and alcohol. ? Wear clothing that is loose-fitting, lightweight, and light-colored.  If it is very cold: ? Avoid activities that take a lot of energy. ? Layer your clothes. ? Wear mittens or gloves, a hat, and a scarf when you go outside. ? Avoid alcohol. Follow these  instructions at home:  Stay up to date with shots (vaccines). Get pneumococcal and flu (influenza) shots.  Keep all follow-up visits as told by your doctor. This is important. Contact a doctor if:  You gain weight quickly.  You have increasing shortness of breath.  You cannot do your normal activities.  You get tired easily.  You cough a lot.  You don't feel like eating or feel like you may vomit (nauseous).  You become puffy (swell) in your hands, feet, ankles, or belly (abdomen).  You cannot sleep well because it is hard to breathe.  You feel like your heart is beating fast (palpitations).  You get dizzy when you stand up. Get help right away if:  You have trouble breathing.  You or someone else notices a change in your behavior, such as having trouble staying awake.  You have chest pain or discomfort.  You pass out (faint). These symptoms may be an emergency. Do not wait to see if the symptoms will go away. Get medical help right away. Call your local emergency services (911 in the [place] Do not drive yourself to the hospital. Summary  Heart failure is a serious condition. To care for yourself, you may have to change your diet, take medicines, and make other lifestyle changes.  Take your medicines every day. Do not stop taking them unless your doctor tells you to do so.  Eat heart-healthy foods, such as fresh or frozen fruits and vegetables, fish, lean meats, legumes, fat-free or low-fat dairy products, and whole-grain or high-fiber foods.  Ask your doctor if you can drink alcohol. You may have to stop alcohol use if you have very bad heart failure.  Contact your doctor if you gain weight quickly or feel that your heart is beating too fast. Get help right away if you pass out, or have chest pain or trouble breathing. This information is not intended to replace advice given to you by your health care provider. Make sure you discuss any questions you have with your  health care provider. Document Revised: 02/02/2019 Document Reviewed: 02/03/2019 Elsevier Patient Education  [place].

## 2020-05-12 ENCOUNTER — Inpatient Hospital Stay (HOSPITAL_COMMUNITY): Admission: RE | Admit: 2020-05-12 | Payer: Medicare HMO | Source: Ambulatory Visit

## 2020-05-13 ENCOUNTER — Other Ambulatory Visit: Payer: Self-pay | Admitting: Family Medicine

## 2020-05-15 ENCOUNTER — Ambulatory Visit: Payer: Medicare HMO | Admitting: Pulmonary Disease

## 2020-05-15 ENCOUNTER — Other Ambulatory Visit: Payer: Self-pay

## 2020-05-18 ENCOUNTER — Ambulatory Visit (INDEPENDENT_AMBULATORY_CARE_PROVIDER_SITE_OTHER): Payer: Medicare HMO | Admitting: *Deleted

## 2020-05-18 DIAGNOSIS — I480 Paroxysmal atrial fibrillation: Secondary | ICD-10-CM

## 2020-05-18 DIAGNOSIS — I428 Other cardiomyopathies: Secondary | ICD-10-CM

## 2020-05-18 LAB — CUP PACEART REMOTE DEVICE CHECK
Battery Remaining Longevity: 105 mo
Battery Voltage: 3 V
Brady Statistic RV Percent Paced: 2.5 %
Date Time Interrogation Session: 20210715033524
HighPow Impedance: 55 Ohm
Implantable Lead Implant Date: 20180517
Implantable Lead Location: 753860
Implantable Pulse Generator Implant Date: 20180517
Lead Channel Impedance Value: 342 Ohm
Lead Channel Impedance Value: 399 Ohm
Lead Channel Pacing Threshold Amplitude: 0.5 V
Lead Channel Pacing Threshold Pulse Width: 0.4 ms
Lead Channel Sensing Intrinsic Amplitude: 9.875 mV
Lead Channel Sensing Intrinsic Amplitude: 9.875 mV
Lead Channel Setting Pacing Amplitude: 2.5 V
Lead Channel Setting Pacing Pulse Width: 0.4 ms
Lead Channel Setting Sensing Sensitivity: 0.3 mV

## 2020-05-18 NOTE — Progress Notes (Signed)
Remote ICD transmission.   

## 2020-05-23 ENCOUNTER — Other Ambulatory Visit: Payer: Self-pay

## 2020-05-23 ENCOUNTER — Observation Stay (HOSPITAL_COMMUNITY)
Admission: EM | Admit: 2020-05-23 | Discharge: 2020-05-24 | Disposition: A | Discharge status: Home / Self Care | Payer: Medicare HMO | Attending: Internal Medicine | Admitting: Internal Medicine

## 2020-05-23 DIAGNOSIS — K219 Gastro-esophageal reflux disease without esophagitis: Secondary | ICD-10-CM | POA: Diagnosis present

## 2020-05-23 DIAGNOSIS — Z79899 Other long term (current) drug therapy: Secondary | ICD-10-CM | POA: Diagnosis not present

## 2020-05-23 DIAGNOSIS — K625 Hemorrhage of anus and rectum: Secondary | ICD-10-CM | POA: Diagnosis not present

## 2020-05-23 DIAGNOSIS — I2510 Atherosclerotic heart disease of native coronary artery without angina pectoris: Secondary | ICD-10-CM | POA: Diagnosis not present

## 2020-05-23 DIAGNOSIS — K649 Unspecified hemorrhoids: Secondary | ICD-10-CM | POA: Diagnosis not present

## 2020-05-23 DIAGNOSIS — Z20822 Contact with and (suspected) exposure to covid-19: Secondary | ICD-10-CM | POA: Insufficient documentation

## 2020-05-23 DIAGNOSIS — I5022 Chronic systolic (congestive) heart failure: Secondary | ICD-10-CM | POA: Diagnosis not present

## 2020-05-23 DIAGNOSIS — G4733 Obstructive sleep apnea (adult) (pediatric): Secondary | ICD-10-CM | POA: Diagnosis present

## 2020-05-23 DIAGNOSIS — Z86010 Personal history of colonic polyps: Secondary | ICD-10-CM | POA: Insufficient documentation

## 2020-05-23 DIAGNOSIS — I480 Paroxysmal atrial fibrillation: Secondary | ICD-10-CM | POA: Diagnosis present

## 2020-05-23 DIAGNOSIS — Z951 Presence of aortocoronary bypass graft: Secondary | ICD-10-CM

## 2020-05-23 DIAGNOSIS — I130 Hypertensive heart and chronic kidney disease with heart failure and stage 1 through stage 4 chronic kidney disease, or unspecified chronic kidney disease: Secondary | ICD-10-CM | POA: Diagnosis not present

## 2020-05-23 DIAGNOSIS — N183 Chronic kidney disease, stage 3 unspecified: Secondary | ICD-10-CM | POA: Diagnosis not present

## 2020-05-23 DIAGNOSIS — E785 Hyperlipidemia, unspecified: Secondary | ICD-10-CM | POA: Diagnosis present

## 2020-05-23 DIAGNOSIS — D649 Anemia, unspecified: Secondary | ICD-10-CM | POA: Diagnosis not present

## 2020-05-23 DIAGNOSIS — F17200 Nicotine dependence, unspecified, uncomplicated: Secondary | ICD-10-CM | POA: Diagnosis present

## 2020-05-23 DIAGNOSIS — Z7901 Long term (current) use of anticoagulants: Secondary | ICD-10-CM | POA: Diagnosis not present

## 2020-05-23 DIAGNOSIS — R42 Dizziness and giddiness: Secondary | ICD-10-CM | POA: Insufficient documentation

## 2020-05-23 DIAGNOSIS — Z95810 Presence of automatic (implantable) cardiac defibrillator: Secondary | ICD-10-CM | POA: Diagnosis not present

## 2020-05-23 DIAGNOSIS — D62 Acute posthemorrhagic anemia: Secondary | ICD-10-CM | POA: Diagnosis present

## 2020-05-23 DIAGNOSIS — Z7982 Long term (current) use of aspirin: Secondary | ICD-10-CM | POA: Diagnosis not present

## 2020-05-23 DIAGNOSIS — G4730 Sleep apnea, unspecified: Secondary | ICD-10-CM | POA: Diagnosis present

## 2020-05-23 DIAGNOSIS — I420 Dilated cardiomyopathy: Secondary | ICD-10-CM | POA: Diagnosis present

## 2020-05-23 DIAGNOSIS — Z8711 Personal history of peptic ulcer disease: Secondary | ICD-10-CM

## 2020-05-23 DIAGNOSIS — Z87891 Personal history of nicotine dependence: Secondary | ICD-10-CM | POA: Diagnosis not present

## 2020-05-23 DIAGNOSIS — I10 Essential (primary) hypertension: Secondary | ICD-10-CM | POA: Diagnosis not present

## 2020-05-23 LAB — COMPREHENSIVE METABOLIC PANEL
ALT: 21 U/L (ref 0–44)
AST: 28 U/L (ref 15–41)
Albumin: 4 g/dL (ref 3.5–5.0)
Alkaline Phosphatase: 40 U/L (ref 38–126)
Anion gap: 8 (ref 5–15)
BUN: 19 mg/dL (ref 6–20)
CO2: 29 mmol/L (ref 22–32)
Calcium: 9.3 mg/dL (ref 8.9–10.3)
Chloride: 100 mmol/L (ref 98–111)
Creatinine, Ser: 1.68 mg/dL — ABNORMAL HIGH (ref 0.61–1.24)
GFR calc Af Amer: 51 mL/min — ABNORMAL LOW (ref >60)
GFR calc non Af Amer: 44 mL/min — ABNORMAL LOW (ref >60)
Glucose, Bld: 96 mg/dL (ref 70–99)
Potassium: 4.5 mmol/L (ref 3.5–5.1)
Sodium: 137 mmol/L (ref 135–145)
Total Bilirubin: 1 mg/dL (ref 0.3–1.2)
Total Protein: 7 g/dL (ref 6.5–8.1)

## 2020-05-23 LAB — CBC
HCT: 39.4 % (ref 39.0–52.0)
Hemoglobin: 12.5 g/dL — ABNORMAL LOW (ref 13.0–17.0)
MCH: 27.8 pg (ref 26.0–34.0)
MCHC: 31.7 g/dL (ref 30.0–36.0)
MCV: 87.6 fL (ref 80.0–100.0)
Platelets: 132 10*3/uL — ABNORMAL LOW (ref 150–400)
RBC: 4.5 MIL/uL (ref 4.22–5.81)
RDW: 14.6 % (ref 11.5–15.5)
WBC: 4.2 10*3/uL (ref 4.0–10.5)
nRBC: 0 % (ref 0.0–0.2)

## 2020-05-23 LAB — TYPE AND SCREEN
ABO/RH(D): O POS
Antibody Screen: NEGATIVE

## 2020-05-23 MED ORDER — SODIUM CHLORIDE 0.9 % IV BOLUS
250.0000 mL | Freq: Once | INTRAVENOUS | Status: AC
  Administered 2020-05-23: 250 mL via INTRAVENOUS

## 2020-05-23 MED ORDER — PANTOPRAZOLE SODIUM 40 MG IV SOLR
40.0000 mg | Freq: Once | INTRAVENOUS | Status: AC
  Administered 2020-05-23: 40 mg via INTRAVENOUS
  Filled 2020-05-23: qty 40

## 2020-05-23 NOTE — ED Triage Notes (Signed)
Pt arrives via POV from home with bright red blood per rectum for the last 2 weeks. Pt takes eliquis for afib. Reports dizziness, lightheaded sensation at times. Pt awake, alert, appropriate, VSS at this time.

## 2020-05-23 NOTE — ED Notes (Signed)
Patient refused vitals.

## 2020-05-23 NOTE — ED Provider Notes (Signed)
[facility] EMERGENCY DEPARTMENT Provider Note   CSN: [id number] Arrival date & time: 05/23/20  1318     History No chief complaint on file.   Bruce Robertson is a 59 y.o. male.  59 y.o. male with a history of NICM s/p ICD, OSA, HTN, CAD s/p CABG 2007, DM, CKD, and paroxysmal atrial fibrillation on eliquis presenting with increasing frequency of rectal bleeding for the past several weeks. States he has had blood in his stool "all of my life" and frequently has bleeding when he has a hard bowel movement. Has noticed increased rectal bleeding over the past several weeks. Stool is brown, red mixed in, red with wiping and red in the toilet bowel. Denies black stools. States he had bleeding ulcers in his 94s. Does have some dizziness and lightheadedness at times. But no chest pain or SOB. States that shortness of breath is actually better since being started on multiple cardiac medications in May. Had a colonoscopy last year that was reassuring on his report. He called his cardiologist and was referred to the ED. Does not know when his last EGD was.   The history is provided by the patient.       Past Medical History:  Diagnosis Date  . AICD (automatic cardioverter/defibrillator) present   . Anxiety   . Cardiomyopathy    Nonischemic. EF has been about 45%. S/P CABG.;  b.  Echo 4/14: EF 25%, global HK with inf and mid apical AK, restrictive physiology with E/e' > 15 (elevated LV filling pressure), trivial AI/MR, mild to mod LAE, mild RVE, mild reduced RVSF, mild RAE, mod TR, PASP 74 (severe pulmonary HTN)   . CHF (congestive heart failure) ([place])   . CHF exacerbation ([place]) 07/31/2016  . Colon polyps 2002  . Coronary artery disease   . Depression   . Dyslipidemia    hx (03/20/2017)  . GERD (gastroesophageal reflux disease)   . History of bleeding peptic ulcer   . History of echocardiogram    Echo 10/16:  EF 25-30%, poss non-compaction, diff HK with inf-lat and apical HK,  restrictive physio, mild AI, severe LAE, mild RVE with mild reduced RVSF, PASP 42 mmHg  . Hyperplastic rectal polyp   . OSA on CPAP    Mild  . Pneumonia   . Tobacco abuse    Remote    Patient Active Problem List   Diagnosis Date Noted  . Paroxysmal atrial fibrillation ([place]) 03/28/2020  . Secondary hypercoagulable state ([place]) 03/28/2020  . Educated about COVID-19 virus infection 02/10/2020  . S/P ICD (internal cardiac defibrillator) procedure 07/18/2019  . Nontraumatic complete tear of right rotator cuff 06/29/2019  . Pain in left elbow 06/29/2019  . Prediabetes 06/10/2018  . Thrombocytopenia ([place]) 06/10/2018  . AICD (automatic cardioverter/defibrillator) present 06/10/2018  . History of peptic ulcer disease 06/10/2018  . Non-ischemic cardiomyopathy ([place])   . NSVT (nonsustained ventricular tachycardia) ([place])   . Hypotension due to drugs 02/09/2018  . Polyp of colon 04/29/2016  . Dyspnea on exertion 03/05/2016  . Essential hypertension 10/31/2015  . Acute on chronic systolic heart failure ([place]) 08/29/2015  . Hx of CABG 08/29/2015  . Stage 3 chronic kidney disease 08/29/2015  . OSA (obstructive sleep apnea) 06/10/2011  . Fatigue 06/10/2011  . Obesity, unspecified 05/02/2010  . Hemorrhoids 02/08/2010  . ESOPHAGEAL REFLUX 02/08/2010  . Rectal bleeding 02/08/2010  . Hyperlipidemia 04/06/2009  . TOBACCO USER 04/06/2009  . Cardiomyopathy, dilated, nonischemic ([place]) 04/06/2009    Past Surgical  History:  Procedure Laterality Date  . CARDIAC CATHETERIZATION  2007;  2009  . CARDIAC DEFIBRILLATOR PLACEMENT  03/20/2017  . COLONOSCOPY WITH PROPOFOL N/A 01/21/2019   Procedure: COLONOSCOPY WITH PROPOFOL;  Surgeon: Milus Banister, MD;  Location: [facility] ENDOSCOPY;  Service: Endoscopy;  Laterality: N/A;  . CORONARY ARTERY BYPASS GRAFT  2007   Had a left main dissection after catheterization. Underwent a saphenous vein graft to the LAD and a saphenous vein graft to obtuse marginal.  . ICD  IMPLANT N/A 03/20/2017   Procedure: ICD Implant;  Surgeon: Constance Haw, MD;  Location: [facility];  Service: Cardiovascular;  Laterality: N/A;  . PATELLAR TENDON REPAIR Right 2005  . POLYPECTOMY  01/21/2019   Procedure: POLYPECTOMY;  Surgeon: Milus Banister, MD;  Location: [facility];  Service: Endoscopy;;       Family History  Problem Relation Age of Onset  . Hypertension Mother   . Osteoarthritis Mother   . Heart failure Father 70  . High blood pressure Father   . Lung cancer Other   . Cancer Maternal Grandmother        uncertain type  . Bipolar disorder Daughter   . Diabetes Daughter   . Cancer Maternal Uncle        uncertain type  . Diabetes Niece   . Asthma Neg Hx     Social History   Tobacco Use  . Smoking status: Former Smoker    Packs/day: 0.25    Years: 7.00    Pack years: 1.75  . Smokeless tobacco: Never Used  . Tobacco comment: 03/20/2017 "quit before 2000"  Vaping Use  . Vaping Use: Former  . Devices: vaped for about a month  Substance Use Topics  . Alcohol use: Yes    Alcohol/week: 0.0 standard drinks    Comment: twice a month no preference  . Drug use: Yes    Types: Marijuana    Comment: 03/20/2017 "joint qod"    Home Medications Prior to Admission medications   Medication Sig Start Date End Date Taking? Authorizing Provider  ACCU-CHEK GUIDE test strip  02/15/20   [provider]  Accu-Chek Softclix Lancets lancets  02/15/20   [provider]  apixaban (ELIQUIS) 5 MG TABS tablet Take 1 tablet (5 mg total) by mouth 2 (two) times daily. 03/28/20   Fenton, Clint R, PA  aspirin EC 81 MG tablet Take 81 mg by mouth daily.    [provider]  blood glucose meter kit and supplies KIT Dispense based on patient and insurance preference. Use up to four times daily as directed. 02/14/20   Caren Macadam, MD  carvedilol (COREG) 12.5 MG tablet Take 1 tablet (12.5 mg total) by mouth 2 (two) times daily. 11/26/19    Camnitz, Ocie Doyne, MD  Multiple Vitamin (MULTIVITAMIN WITH MINERALS) TABS tablet Take 1 tablet by mouth daily.    [provider]  sacubitril-valsartan (ENTRESTO) 24-26 MG Take 1 tablet by mouth in the morning and at bedtime.  03/30/20   [provider]  simvastatin (ZOCOR) 20 MG tablet Take 20 mg by mouth daily.  02/29/20   [provider]  spironolactone (ALDACTONE) 25 MG tablet Take 25 mg by mouth daily.  03/30/20   [provider]  torsemide (DEMADEX) 20 MG tablet Take 2 tablets (40 mg total) by mouth daily. 10/11/19 04/27/20  Minus Breeding, MD    Allergies    Patient has no known allergies.  Review of Systems  Review of Systems  Constitutional: Positive for fatigue. Negative for activity change, appetite change and fever.  HENT: Negative for congestion and rhinorrhea.   Respiratory: Negative for cough, chest tightness and shortness of breath.   Cardiovascular: Negative for chest pain and leg swelling.  Gastrointestinal: Positive for blood in stool. Negative for abdominal pain, nausea and vomiting.  Genitourinary: Negative for dysuria and hematuria.  Musculoskeletal: Negative for arthralgias and myalgias.  Neurological: Positive for dizziness and light-headedness.   all other systems are negative except as noted in the HPI and PMH.    Physical Exam Updated Vital Signs BP (!) 87/61 (BP Location: Left Arm)   Pulse (!) 46   Temp 98.4 F (36.9 C) (Oral)   Resp 16   Ht 6' 1"  (1.854 m)   Wt 102.5 kg   SpO2 100%   BMI 29.82 kg/m   Physical Exam Vitals and nursing note reviewed.  Constitutional:      General: He is not in acute distress.    Appearance: He is well-developed.  HENT:     Head: Normocephalic and atraumatic.     Mouth/Throat:     Pharynx: No oropharyngeal exudate.  Eyes:     Conjunctiva/sclera: Conjunctivae normal.     Pupils: Pupils are equal, round, and reactive to light.  Neck:     Comments: No  meningismus. Cardiovascular:     Rate and Rhythm: Normal rate and regular rhythm.     Heart sounds: Normal heart sounds. No murmur heard.   Pulmonary:     Effort: Pulmonary effort is normal. No respiratory distress.     Breath sounds: Normal breath sounds.  Chest:     Chest wall: No tenderness.  Abdominal:     Palpations: Abdomen is soft.     Tenderness: There is no abdominal tenderness. There is no guarding or rebound.  Genitourinary:    Comments: No external hemorrhoids seen. No fissures. No gross blood Musculoskeletal:        General: No tenderness. Normal range of motion.     Cervical back: Normal range of motion and neck supple.  Skin:    General: Skin is warm.     Capillary Refill: Capillary refill takes less than 2 seconds.  Neurological:     Mental Status: He is alert and oriented to person, place, and time.     Cranial Nerves: No cranial nerve deficit.     Motor: No abnormal muscle tone.     Coordination: Coordination normal.     Comments: No ataxia on finger to nose bilaterally. No pronator drift. 5/5 strength throughout. CN 2-12 intact.Equal grip strength. Sensation intact.   Psychiatric:        Behavior: Behavior normal.     ED Results / Procedures / Treatments   Labs (all labs ordered are listed, but only abnormal results are displayed) Labs Reviewed  COMPREHENSIVE METABOLIC PANEL - Abnormal; Notable for the following components:      Result Value   Creatinine, Ser 1.68 (*)    GFR calc non Af Amer 44 (*)    GFR calc Af Amer 51 (*)    All other components within normal limits  CBC - Abnormal; Notable for the following components:   Hemoglobin 12.5 (*)    Platelets 132 (*)    All other components within normal limits  BRAIN NATRIURETIC PEPTIDE - Abnormal; Notable for the following components:   B Natriuretic Peptide 356.1 (*)    All other components within normal limits  BASIC METABOLIC PANEL - Abnormal; Notable for the following components:   Potassium  2.8 (*)    Chloride 116 (*)    CO2 21 (*)    Calcium 6.1 (*)    All other components within normal limits  CBC - Abnormal; Notable for the following components:   RBC 3.52 (*)    Hemoglobin 10.1 (*)    HCT 32.2 (*)    Platelets 96 (*)    All other components within normal limits  SARS CORONAVIRUS 2 BY RT PCR (HOSPITAL ORDER, [facility])  HEMOGLOBIN AND HEMATOCRIT, BLOOD  HIV ANTIBODY (ROUTINE TESTING W REFLEX)  POC OCCULT BLOOD, ED  POC OCCULT BLOOD, ED  TYPE AND SCREEN  ABO/RH  TROPONIN I (HIGH SENSITIVITY)  TROPONIN I (HIGH SENSITIVITY)    EKG EKG Interpretation  Date/Time:  Wednesday May 24 2020 00:45:32 EDT Ventricular Rate:  51 PR Interval:    QRS Duration: 108 QT Interval:  460 QTC Calculation: 424 R Axis:   134 Text Interpretation: Sinus arrhythmia Left posterior fascicular block Low voltage with right axis deviation No significant change was found Confirmed by Ezequiel Essex [phone]) on 05/24/2020 12:53:20 AM   Radiology No results found.  Procedures Procedures (including critical care time)  Medications Ordered in ED Medications  carvedilol (COREG) tablet 12.5 mg (12.5 mg Oral Not Given 05/24/20 0239)  simvastatin (ZOCOR) tablet 20 mg (has no administration in time range)  spironolactone (ALDACTONE) tablet 25 mg (has no administration in time range)  sodium chloride 0.9 % bolus 250 mL (0 mLs Intravenous Stopped 05/24/20 0129)  pantoprazole (PROTONIX) injection 40 mg (40 mg Intravenous Given 05/23/20 2349)    ED Course  I have reviewed the triage vital signs and the nursing notes.  Pertinent labs & imaging results that were available during my care of the patient were reviewed by me and considered in my medical decision making (see chart for details).    MDM Rules/Calculators/A&P                         Intermittent rectal bleeding for several weeks. On eliquis. Hypotensive in the 80s.  Hemoglobin 12.5 from 14 in  April.  Colonoscopy in March 2020 showed 1 polyp, external and internal hemorrhoids. Orthostatics are negative.  Hemoccult is actually negative but this is a poor sample.  Patient states stools are brown and not black.  With Eliquis and aspirin use and unclear source of bleeding will recommend admission.  Patient is agreeable. Unknown last EGD.  Anoscopy performed by Dr. Waldron [facility] which did not show evidence of recent hemorrhoidal bleeding.  Hemoglobin has down trended to 10.  Patient will need observation admission likely GI evaluation.  Vitals remained stable. Admission d/w Dr. [facility].  Final Clinical Impression(s) / ED Diagnoses Final diagnoses:  Rectal bleeding    Rx / DC Orders ED Discharge Orders    None       Johnae Friley, Annie Main, MD 05/24/20 0505

## 2020-05-24 DIAGNOSIS — Z7901 Long term (current) use of anticoagulants: Secondary | ICD-10-CM | POA: Diagnosis not present

## 2020-05-24 DIAGNOSIS — K641 Second degree hemorrhoids: Secondary | ICD-10-CM | POA: Diagnosis not present

## 2020-05-24 DIAGNOSIS — D62 Acute posthemorrhagic anemia: Secondary | ICD-10-CM | POA: Diagnosis not present

## 2020-05-24 DIAGNOSIS — K649 Unspecified hemorrhoids: Secondary | ICD-10-CM

## 2020-05-24 DIAGNOSIS — K2100 Gastro-esophageal reflux disease with esophagitis, without bleeding: Secondary | ICD-10-CM

## 2020-05-24 DIAGNOSIS — I10 Essential (primary) hypertension: Secondary | ICD-10-CM | POA: Diagnosis not present

## 2020-05-24 DIAGNOSIS — Z95810 Presence of automatic (implantable) cardiac defibrillator: Secondary | ICD-10-CM

## 2020-05-24 DIAGNOSIS — N1831 Chronic kidney disease, stage 3a: Secondary | ICD-10-CM | POA: Diagnosis not present

## 2020-05-24 DIAGNOSIS — I5022 Chronic systolic (congestive) heart failure: Secondary | ICD-10-CM | POA: Diagnosis present

## 2020-05-24 DIAGNOSIS — I480 Paroxysmal atrial fibrillation: Secondary | ICD-10-CM

## 2020-05-24 DIAGNOSIS — K625 Hemorrhage of anus and rectum: Secondary | ICD-10-CM

## 2020-05-24 DIAGNOSIS — G4733 Obstructive sleep apnea (adult) (pediatric): Secondary | ICD-10-CM

## 2020-05-24 DIAGNOSIS — I420 Dilated cardiomyopathy: Secondary | ICD-10-CM

## 2020-05-24 DIAGNOSIS — E782 Mixed hyperlipidemia: Secondary | ICD-10-CM

## 2020-05-24 LAB — BASIC METABOLIC PANEL
Anion gap: 5 (ref 5–15)
Anion gap: 7 (ref 5–15)
BUN: 14 mg/dL (ref 6–20)
BUN: 19 mg/dL (ref 6–20)
CO2: 21 mmol/L — ABNORMAL LOW (ref 22–32)
CO2: 26 mmol/L (ref 22–32)
Calcium: 6.1 mg/dL — CL (ref 8.9–10.3)
Calcium: 8 mg/dL — ABNORMAL LOW (ref 8.9–10.3)
Chloride: 116 mmol/L — ABNORMAL HIGH (ref 98–111)
Chloride: 104 mmol/L (ref 98–111)
Creatinine, Ser: 1.17 mg/dL (ref 0.61–1.24)
Creatinine, Ser: 1.68 mg/dL — ABNORMAL HIGH (ref 0.61–1.24)
GFR calc Af Amer: >60 mL/min (ref >60)
GFR calc Af Amer: 51 mL/min — ABNORMAL LOW (ref >60)
GFR calc non Af Amer: >60 mL/min (ref >60)
GFR calc non Af Amer: 44 mL/min — ABNORMAL LOW (ref >60)
Glucose, Bld: 91 mg/dL (ref 70–99)
Glucose, Bld: 100 mg/dL — ABNORMAL HIGH (ref 70–99)
Potassium: 2.8 mmol/L — ABNORMAL LOW (ref 3.5–5.1)
Potassium: 4.5 mmol/L (ref 3.5–5.1)
Sodium: 142 mmol/L (ref 135–145)
Sodium: 137 mmol/L (ref 135–145)

## 2020-05-24 LAB — CBC
HCT: 32.2 % — ABNORMAL LOW (ref 39.0–52.0)
Hemoglobin: 10.1 g/dL — ABNORMAL LOW (ref 13.0–17.0)
MCH: 28.7 pg (ref 26.0–34.0)
MCHC: 31.4 g/dL (ref 30.0–36.0)
MCV: 91.5 fL (ref 80.0–100.0)
Platelets: 96 10*3/uL — ABNORMAL LOW (ref 150–400)
RBC: 3.52 MIL/uL — ABNORMAL LOW (ref 4.22–5.81)
RDW: 14.6 % (ref 11.5–15.5)
WBC: 4.3 10*3/uL (ref 4.0–10.5)
nRBC: 0 % (ref 0.0–0.2)

## 2020-05-24 LAB — HEMOGLOBIN AND HEMATOCRIT, BLOOD
HCT: 41.3 % (ref 39.0–52.0)
HCT: 41.9 % (ref 39.0–52.0)
Hemoglobin: 13 g/dL (ref 13.0–17.0)
Hemoglobin: 13.3 g/dL (ref 13.0–17.0)

## 2020-05-24 LAB — POC OCCULT BLOOD, ED: Fecal Occult Bld: NEGATIVE

## 2020-05-24 LAB — SARS CORONAVIRUS 2 BY RT PCR (HOSPITAL ORDER, PERFORMED IN ~~LOC~~ HOSPITAL LAB): SARS Coronavirus 2: NEGATIVE

## 2020-05-24 LAB — TROPONIN I (HIGH SENSITIVITY)
Troponin I (High Sensitivity): 17 ng/L (ref <18)
Troponin I (High Sensitivity): 15 ng/L (ref <18)

## 2020-05-24 LAB — BRAIN NATRIURETIC PEPTIDE: B Natriuretic Peptide: 356.1 pg/mL — ABNORMAL HIGH (ref 0.0–100.0)

## 2020-05-24 LAB — HIV ANTIBODY (ROUTINE TESTING W REFLEX): HIV Screen 4th Generation wRfx: NONREACTIVE

## 2020-05-24 LAB — ABO/RH: ABO/RH(D): O POS

## 2020-05-24 MED ORDER — SIMVASTATIN 20 MG PO TABS
20.0000 mg | ORAL_TABLET | Freq: Every day | ORAL | Status: DC
  Administered 2020-05-24: 20 mg via ORAL
  Filled 2020-05-24: qty 1

## 2020-05-24 MED ORDER — CARVEDILOL 12.5 MG PO TABS
12.5000 mg | ORAL_TABLET | Freq: Two times a day (BID) | ORAL | Status: DC
  Administered 2020-05-24: 12.5 mg via ORAL
  Filled 2020-05-24 (×2): qty 4

## 2020-05-24 MED ORDER — SPIRONOLACTONE 25 MG PO TABS
25.0000 mg | ORAL_TABLET | Freq: Every day | ORAL | Status: DC
  Administered 2020-05-24: 25 mg via ORAL
  Filled 2020-05-24: qty 1

## 2020-05-24 NOTE — Discharge Summary (Signed)
Discharge Summary  Bruce Robertson JSH:[id number] DOB: 1961/06/11  PCP: Caren Macadam, MD  Admit date: 05/23/2020 Discharge date: 05/24/2020  Time spent: 45 minutes  Recommendations for Outpatient Follow-up:  1. Patient will follow up with  surgery on 8/9 to meet with her colorectal surgeon about scheduling hemorrhoidectomy 2. Patient will follow up with his cardiologist as scheduled  Discharge Diagnoses:  Active Hospital Problems   Diagnosis Date Noted  . Acute blood loss anemia 05/24/2020  . BRBPR (bright red blood per rectum) 05/24/2020  . Chronic systolic CHF (congestive heart failure) ([place]) 05/24/2020  . Paroxysmal atrial fibrillation ([place]) 03/28/2020  . AICD (automatic cardioverter/defibrillator) present 06/10/2018  . History of peptic ulcer disease 06/10/2018  . Essential hypertension 10/31/2015  . Hx of CABG 08/29/2015  . Stage 3 chronic kidney disease 08/29/2015  . OSA (obstructive sleep apnea) 06/10/2011  . Esophageal reflux 02/08/2010  . Hemorrhoids 02/08/2010  . Cardiomyopathy, dilated, nonischemic ([place]) 04/06/2009  . Hyperlipidemia 04/06/2009  . TOBACCO USER 04/06/2009    Resolved Hospital Problems  No resolved problems to display.    Discharge Condition: Improved, being discharged home  Diet recommendation: Heart healthy  Vitals:   05/24/20 1333 05/24/20 1422  BP: 101/70 101/76  Pulse: 61 (!) 52  Resp: 16 16  Temp: 97.6 F (36.4 C) (!) 97.3 F (36.3 C)  SpO2: 100% 100%    History of present illness:  HPI: 59 year old male with past medical history of chronic systolic heart failure, obstructive sleep apnea and paroxysmal A. fib on Eliquis who presented to the emergency room with bright red blood per rectum x3 weeks.  Patient had similar issues approximately 16 months ago and at that time was evaluated and underwent colonoscopy and EGD and found to have significant hemorrhoids.  In the emergency room, hemoglobin initially at 13.3,  close to patient's baseline, however this was felt to be more hemoconcentrated and with some IV fluids, down to 10.1.  No hematemesis or black tarry stool reported.   Hospital Course:  Principal Problem: Bright red blood per rectum causing acute blood loss anemia secondary to active bleeding hemorrhoids exacerbated by being on anticoagulant:  He was evaluated by GI who felt that issues were from recurrent hemorrhoids exacerbated by the fact that he is on anticoagulation and recommended surgery consult for hemorrhoidectomy.  Seen by surgery who recommended given patient stability and that colorectal surgeon on-call, patient could follow-up in their office for hemorrhoidectomy.  In the meantime, patient's bleeding stable.  A repeat hemoglobin this afternoon back up to 13.3.  Active Problems:   Hyperlipidemia: Continue statin    TOBACCO USER: counseled.     Cardiomyopathy, dilated, nonischemic (HCC)/chronic systolic heart failure with history of AICD: Stable at this time.  Received some mild fluid, but does not appear volume overloaded  Overweight: Patient meets criteria BMI greater than 25    OSA (obstructive sleep apnea): Stable    Stage 3 chronic kidney disease: At baseline.  Creatinine at 1.68 upon day of discharge   Essential hypertension    Paroxysmal atrial fibrillation ([place]): Stable.  Rate controlled.  Resume anticoagulation.   Procedures:  None  Consultations:  Gastroenterology  General surgery  Discharge Exam: BP 101/76 (BP Location: Left Arm)   Pulse (!) 52   Temp (!) 97.3 F (36.3 C) (Oral)   Resp 16   Ht 6' 1"  (1.854 m)   Wt 102.5 kg   SpO2 100%   BMI 29.82 kg/m   General: Alert and oriented  x3, no acute distress Cardiovascular: Regular rate and rhythm, S1-S2 Respiratory: Clear to auscultation bilaterally  Discharge Instructions You were cared for by a hospitalist during your hospital stay. If you have any questions about your discharge medications or the  care you received while you were in the hospital after you are discharged, you can call the unit and asked to speak with the hospitalist on call if the hospitalist that took care of you is not available. Once you are discharged, your primary care physician will handle any further medical issues. Please note that NO REFILLS for any discharge medications will be authorized once you are discharged, as it is imperative that you return to your primary care physician (or establish a relationship with a primary care physician if you do not have one) for your aftercare needs so that they can reassess your need for medications and monitor your lab values.  Discharge Instructions    Diet - low sodium heart healthy   Complete by: As directed    Increase activity slowly   Complete by: As directed      Allergies as of 05/24/2020   No Known Allergies     Medication List    TAKE these medications   Accu-Chek Guide test strip Generic drug: glucose blood   Accu-Chek Softclix Lancets lancets   apixaban 5 MG Tabs tablet Commonly known as: Eliquis Take 1 tablet (5 mg total) by mouth 2 (two) times daily.   aspirin EC 81 MG tablet Take 81 mg by mouth daily.   blood glucose meter kit and supplies Kit Dispense based on patient and insurance preference. Use up to four times daily as directed.   carvedilol 12.5 MG tablet Commonly known as: COREG Take 1 tablet (12.5 mg total) by mouth 2 (two) times daily.   Entresto 24-26 MG Generic drug: sacubitril-valsartan Take 1 tablet by mouth in the morning and at bedtime.   multivitamin with minerals Tabs tablet Take 1 tablet by mouth daily.   simvastatin 40 MG tablet Commonly known as: ZOCOR Take 40 mg by mouth daily.   spironolactone 25 MG tablet Commonly known as: ALDACTONE Take 25 mg by mouth daily.   torsemide 20 MG tablet Commonly known as: DEMADEX Take 2 tablets (40 mg total) by mouth daily.      No Known Allergies  Follow-up Information      Michael Boston, MD. Go on 06/12/2020.   Specialty: General Surgery Why: Arrive by 1:15 PM for 1:45 PM appointment. Bring photo ID and insurance information with you.  Contact information: [address] [place] [address] [phone]                The results of significant diagnostics from this hospitalization (including imaging, microbiology, ancillary and laboratory) are listed below for reference.    Significant Diagnostic Studies: CUP PACEART REMOTE DEVICE CHECK  Result Date: 05/18/2020 Scheduled remote reviewed. Normal device function.  Next remote 91 days. Kathy Breach, RN, CCDS, [employer]   Microbiology: Recent Results (from the past 240 hour(s))  SARS Coronavirus 2 by RT PCR (hospital order, performed in [facility] hospital lab) Nasopharyngeal Nasopharyngeal Swab     Status: None   Collection Time: 05/24/20  1:24 AM   Specimen: Nasopharyngeal Swab  Result Value Ref Range Status   SARS Coronavirus 2 NEGATIVE NEGATIVE Final    Comment: (NOTE) SARS-CoV-2 target nucleic acids are NOT DETECTED.  The SARS-CoV-2 RNA is generally detectable in upper and lower  respiratory specimens during the acute phase of infection. The lowest concentration of SARS-CoV-2 viral copies this assay can detect is 250 copies / mL. A negative result does not preclude SARS-CoV-2 infection and should not be used as the sole basis for treatment or other patient management decisions.  A negative result may occur with improper specimen collection / handling, submission of specimen other than nasopharyngeal swab, presence of viral mutation(s) within the areas targeted by this assay, and inadequate number of viral copies (<250 copies / mL). A negative result must be combined with clinical observations, patient history, and epidemiological information.  Fact Sheet for Patients:   StrictlyIdeas.no  Fact Sheet for Healthcare  Providers: BankingDealers.co.za  This test is not yet approved or  cleared by the [place] FDA and has been authorized for detection and/or diagnosis of SARS-CoV-2 by FDA under an Emergency Use Authorization (EUA).  This EUA will remain in effect (meaning this test can be used) for the duration of the COVID-19 declaration under Section 564(b)(1) of the Act, 21 U.S.C. section 360bbb-3(b)(1), unless the authorization is terminated or revoked sooner.  Performed at [facility], [place] [address] [place], [place] [address]      Labs: Basic Metabolic Panel: Recent Labs  Lab 05/23/20 1345 05/24/20 0402 05/24/20 0541  NA 137 142 137  K 4.5 2.8* 4.5  CL 100 116* 104  CO2 29 21* 26  GLUCOSE 96 91 100*  BUN 19 14 19   CREATININE 1.68* 1.17 1.68*  CALCIUM 9.3 6.1* 8.0*   Liver Function Tests: Recent Labs  Lab 05/23/20 1345  AST 28  ALT 21  ALKPHOS 40  BILITOT 1.0  PROT 7.0  ALBUMIN 4.0   No results for input(s): LIPASE, AMYLASE in the last 168 hours. No results for input(s): AMMONIA in the last 168 hours. CBC: Recent Labs  Lab 05/23/20 1345 05/23/20 2344 05/24/20 0402 05/24/20 1421  WBC 4.2  --  4.3  --   HGB 12.5* 13.3 10.1* 13.0  HCT 39.4 41.9 32.2* 41.3  MCV 87.6  --  91.5  --   PLT 132*  --  96*  --    Cardiac Enzymes: No results for input(s): CKTOTAL, CKMB, CKMBINDEX, TROPONINI in the last 168 hours. BNP: BNP (last 3 results) Recent Labs    11/29/19 1027 05/23/20 2344  BNP 156.1* 356.1*    ProBNP (last 3 results) No results for input(s): PROBNP in the last 8760 hours.  CBG: No results for input(s): GLUCAP in the last 168 hours.     Signed:  Annita Brod, MD [facility] 05/24/2020, 4:33 PM

## 2020-05-24 NOTE — Progress Notes (Signed)
Discharge paperwork given to patient and he stated understanding. IV has been removed no new medications added.

## 2020-05-24 NOTE — Discharge Instructions (Signed)

## 2020-05-24 NOTE — Consult Note (Signed)
[facility] Surgery Consult Note  Bruce Robertson [birth year]  [id number].    Requesting MD: Dr. Gevena Barre Chief Complaint/Reason for Consult: Hemorrhoids HPI: Patient is a 59 year old male who presented to [facility] with complaint of BRBPR x3 weeks. Happens mostly with BMs. Hx of hemorrhoids in the past but has not been an issue for several years and was less severe previously. Denies constipation or diarrhea. Stools have blood mixed in and he mostly has bleeding with wiping. Occasional dizziness and lightheadedness but no chest pain, SOB, DOE. Hx of gastric ulcer in his 46s, no coffee ground emesis or melena currently. Colonoscopy last year by Dr. Ardis Hughs significant for internal and external hemorrhoids and a small polyp which was removed. Has been on eliquis for several months for atrial fibrillation. Bleeding has been worse in the last few weeks since he has been doing more lifting at the gym.   PMH otherwise significant for Non-ischemic cardiomyopathy s/p ICD, HTN, CAD s/p CABG in 2007, CKD, T2DM, OSA. No past abdominal surgery. NKDA.   ROS: Review of Systems  Gastrointestinal: Positive for blood in stool. Negative for abdominal pain, constipation, diarrhea, nausea and vomiting.   ROS otherwise negative.  Family History  Problem Relation Age of Onset  . Hypertension Mother   . Osteoarthritis Mother   . Heart failure Father 64  . High blood pressure Father   . Lung cancer Other   . Cancer Maternal Grandmother        uncertain type  . Bipolar disorder Daughter   . Diabetes Daughter   . Cancer Maternal Uncle        uncertain type  . Diabetes Niece   . Asthma Neg Hx     Past Medical History:  Diagnosis Date  . AICD (automatic cardioverter/defibrillator) present   . Anxiety   . Cardiomyopathy    Nonischemic. EF has been about 45%. S/P CABG.;  b.  Echo 4/14: EF 25%, global HK with inf and mid apical AK, restrictive physiology with E/e' > 15 (elevated LV filling pressure),  trivial AI/MR, mild to mod LAE, mild RVE, mild reduced RVSF, mild RAE, mod TR, PASP 74 (severe pulmonary HTN)   . CHF (congestive heart failure) ([place])   . CHF exacerbation ([place]) 07/31/2016  . Colon polyps 2002  . Coronary artery disease   . Depression   . Dyslipidemia    hx (03/20/2017)  . GERD (gastroesophageal reflux disease)   . History of bleeding peptic ulcer   . History of echocardiogram    Echo 10/16:  EF 25-30%, poss non-compaction, diff HK with inf-lat and apical HK, restrictive physio, mild AI, severe LAE, mild RVE with mild reduced RVSF, PASP 42 mmHg  . Hyperplastic rectal polyp   . OSA on CPAP    Mild  . Pneumonia   . Tobacco abuse    Remote    Past Surgical History:  Procedure Laterality Date  . CARDIAC CATHETERIZATION  2007;  2009  . CARDIAC DEFIBRILLATOR PLACEMENT  03/20/2017  . COLONOSCOPY WITH PROPOFOL N/A 01/21/2019   Procedure: COLONOSCOPY WITH PROPOFOL;  Surgeon: Milus Banister, MD;  Location: [facility];  Service: Endoscopy;  Laterality: N/A;  . CORONARY ARTERY BYPASS GRAFT  2007   Had a left main dissection after catheterization. Underwent a saphenous vein graft to the LAD and a saphenous vein graft to obtuse marginal.  . ICD IMPLANT N/A 03/20/2017   Procedure: ICD Implant;  Surgeon: Constance Haw, MD;  Location: [facility];  Service: Cardiovascular;  Laterality: N/A;  . PATELLAR TENDON REPAIR Right 2005  . POLYPECTOMY  01/21/2019   Procedure: POLYPECTOMY;  Surgeon: Milus Banister, MD;  Location: [facility];  Service: Endoscopy;;    Social History:  reports that he has quit smoking. He has a 1.75 pack-year smoking history. He has never used smokeless tobacco. He reports current alcohol use. He reports current drug use. Drug: Marijuana.  Allergies: No Known Allergies  Facility-Administered Medications Prior to Admission  Medication Dose Route Frequency Provider Last Rate Last Admin  . [DISCONTINUED] methylPREDNISolone acetate  (DEPO-MEDROL) injection 40 mg  40 mg Intra-articular Once Hilts, Michael, MD       Medications Prior to Admission  Medication Sig Dispense Refill  . apixaban (ELIQUIS) 5 MG TABS tablet Take 1 tablet (5 mg total) by mouth 2 (two) times daily. 60 tablet 3  . aspirin EC 81 MG tablet Take 81 mg by mouth daily.    . carvedilol (COREG) 12.5 MG tablet Take 1 tablet (12.5 mg total) by mouth 2 (two) times daily. 60 tablet 11  . Multiple Vitamin (MULTIVITAMIN WITH MINERALS) TABS tablet Take 1 tablet by mouth daily.    . sacubitril-valsartan (ENTRESTO) 24-26 MG Take 1 tablet by mouth in the morning and at bedtime.     . simvastatin (ZOCOR) 40 MG tablet Take 40 mg by mouth daily.    Marland Kitchen spironolactone (ALDACTONE) 25 MG tablet Take 25 mg by mouth daily.     Marland Kitchen torsemide (DEMADEX) 20 MG tablet Take 2 tablets (40 mg total) by mouth daily. 60 tablet 6  . ACCU-CHEK GUIDE test strip     . Accu-Chek Softclix Lancets lancets     . blood glucose meter kit and supplies KIT Dispense based on patient and insurance preference. Use up to four times daily as directed. 1 each 0    Blood pressure 101/76, pulse (!) 52, temperature (!) 97.3 F (36.3 C), temperature source Oral, resp. rate 16, height 6' 1"  (1.854 m), weight 102.5 kg, SpO2 100 %. Physical Exam:  General: pleasant, WD, WN black male who is laying in bed in NAD HEENT: head is normocephalic, atraumatic.  Sclera are noninjected.  PERRL.  Ears and nose without any masses or lesions.  Mouth is pink and moist Heart: regular, rate, and rhythm.  Normal s1,s2. No obvious murmurs, gallops, or rubs noted.  Palpable radial and pedal pulses bilaterally Lungs: CTAB, no wheezes, rhonchi, or rales noted.  Respiratory effort nonlabored Abd: soft, NT, ND, +BS, no masses, hernias, or organomegaly MS: all 4 extremities are symmetrical with no cyanosis, clubbing, or edema. Skin: warm and dry with no masses, lesions, or rashes Neuro: Cranial nerves 2-12 grossly intact, speech is  normal Psych: A&Ox3 with an appropriate affect.  GU: small external hemorrhoids noted with no active bleeding  Results for orders placed or performed during the hospital encounter of 05/23/20 (from the past 48 hour(s))  Type and screen [place]     Status: None   Collection Time: 05/23/20  1:40 PM  Result Value Ref Range   ABO/RH(D) O POS    Antibody Screen NEG    Sample Expiration      05/26/2020,2359 Performed at [facility] [address] [place], [place] [address]   Comprehensive metabolic panel     Status: Abnormal   Collection Time: 05/23/20  1:45 PM  Result Value Ref Range   Sodium 137 135 - 145 mmol/L   Potassium 4.5 3.5 -  5.1 mmol/L   Chloride 100 98 - 111 mmol/L   CO2 29 22 - 32 mmol/L   Glucose, Bld 96 70 - 99 mg/dL    Comment: Glucose reference range applies only to samples taken after fasting for at least 8 hours.   BUN 19 6 - 20 mg/dL   Creatinine, Ser 1.68 (H) 0.61 - 1.24 mg/dL   Calcium 9.3 8.9 - 10.3 mg/dL   Total Protein 7.0 6.5 - 8.1 g/dL   Albumin 4.0 3.5 - 5.0 g/dL   AST 28 15 - 41 U/L   ALT 21 0 - 44 U/L   Alkaline Phosphatase 40 38 - 126 U/L   Total Bilirubin 1.0 0.3 - 1.2 mg/dL   GFR calc non Af Amer 44 (L) >60 mL/min   GFR calc Af Amer 51 (L) >60 mL/min   Anion gap 8 5 - 15    Comment: Performed at [facility] [address], [place], [place] [address]  CBC     Status: Abnormal   Collection Time: 05/23/20  1:45 PM  Result Value Ref Range   WBC 4.2 4.0 - 10.5 K/uL   RBC 4.50 4.22 - 5.81 MIL/uL   Hemoglobin 12.5 (L) 13.0 - 17.0 g/dL   HCT 39.4 39 - 52 %   MCV 87.6 80.0 - 100.0 fL   MCH 27.8 26.0 - 34.0 pg   MCHC 31.7 30.0 - 36.0 g/dL   RDW 14.6 11.5 - 15.5 %   Platelets 132 (L) 150 - 400 K/uL    Comment: REPEATED TO VERIFY   nRBC 0.0 0.0 - 0.2 %    Comment: Performed at [facility], [place] [address], [place], [place] [address]  Hemoglobin and hematocrit, blood     Status: None   Collection Time:  05/23/20 11:44 PM  Result Value Ref Range   Hemoglobin 13.3 13.0 - 17.0 g/dL   HCT 41.9 39 - 52 %    Comment: Performed at [facility] [address] [place], [place] [address]  Troponin I (High Sensitivity)     Status: None   Collection Time: 05/23/20 11:44 PM  Result Value Ref Range   Troponin I (High Sensitivity) 17 <18 ng/L    Comment: (NOTE) Elevated high sensitivity troponin I (hsTnI) values and significant  changes across serial measurements may suggest ACS but many other  chronic and acute conditions are known to elevate hsTnI results.  Refer to the "Links" section for chest pain algorithms and additional  guidance. Performed at [facility], [place] [address], [place], [place] [address]   Brain natriuretic peptide     Status: Abnormal   Collection Time: 05/23/20 11:44 PM  Result Value Ref Range   B Natriuretic Peptide 356.1 (H) 0.0 - 100.0 pg/mL    Comment: Performed at [facility] [address] [place], [place] [address]  Troponin I (High Sensitivity)     Status: None   Collection Time: 05/24/20 12:26 AM  Result Value Ref Range   Troponin I (High Sensitivity) 15 <18 ng/L    Comment: (NOTE) Elevated high sensitivity troponin I (hsTnI) values and significant  changes across serial measurements may suggest ACS but many other  chronic and acute conditions are known to elevate hsTnI results.  Refer to the "Links" section for chest pain algorithms and additional  guidance. Performed at [facility], [place] [address] [place], [place] [address]   POC occult blood, ED  Status: None   Collection Time: 05/24/20  1:20 AM  Result Value Ref Range   Fecal Occult Bld NEGATIVE NEGATIVE  SARS Coronavirus 2 by RT PCR (hospital order, performed in [facility] hospital lab) Nasopharyngeal Nasopharyngeal Swab     Status: None   Collection Time: 05/24/20  1:24 AM   Specimen: Nasopharyngeal Swab  Result Value Ref Range   SARS Coronavirus 2 NEGATIVE NEGATIVE     Comment: (NOTE) SARS-CoV-2 target nucleic acids are NOT DETECTED.  The SARS-CoV-2 RNA is generally detectable in upper and lower respiratory specimens during the acute phase of infection. The lowest concentration of SARS-CoV-2 viral copies this assay can detect is 250 copies / mL. A negative result does not preclude SARS-CoV-2 infection and should not be used as the sole basis for treatment or other patient management decisions.  A negative result may occur with improper specimen collection / handling, submission of specimen other than nasopharyngeal swab, presence of viral mutation(s) within the areas targeted by this assay, and inadequate number of viral copies (<250 copies / mL). A negative result must be combined with clinical observations, patient history, and epidemiological information.  Fact Sheet for Patients:   StrictlyIdeas.no  Fact Sheet for Healthcare Providers: BankingDealers.co.za  This test is not yet approved or  cleared by the [place] FDA and has been authorized for detection and/or diagnosis of SARS-CoV-2 by FDA under an Emergency Use Authorization (EUA).  This EUA will remain in effect (meaning this test can be used) for the duration of the COVID-19 declaration under Section 564(b)(1) of the Act, 21 U.S.C. section 360bbb-3(b)(1), unless the authorization is terminated or revoked sooner.  Performed at [facility], [place] [address], [place], [place] [address]   HIV Antibody (routine testing w rflx)     Status: None   Collection Time: 05/24/20  4:02 AM  Result Value Ref Range   HIV Screen 4th Generation wRfx Non Reactive Non Reactive    Comment: Performed at [facility], [place] [address] [place], [place] [address]  Basic metabolic panel     Status: Abnormal   Collection Time: 05/24/20  4:02 AM  Result Value Ref Range   Sodium 142 135 - 145 mmol/L   Potassium 2.8 (L) 3.5 - 5.1 mmol/L    Chloride 116 (H) 98 - 111 mmol/L   CO2 21 (L) 22 - 32 mmol/L   Glucose, Bld 91 70 - 99 mg/dL    Comment: Glucose reference range applies only to samples taken after fasting for at least 8 hours.   BUN 14 6 - 20 mg/dL   Creatinine, Ser 1.17 0.61 - 1.24 mg/dL   Calcium 6.1 (LL) 8.9 - 10.3 mg/dL    Comment: CRITICAL RESULT CALLED TO, READ BACK BY AND VERIFIED WITH: RN J SAWATZKI @0453  05/24/20 BY S GEZAHEGN    GFR calc non Af Amer >60 >60 mL/min   GFR calc Af Amer >60 >60 mL/min   Anion gap 5 5 - 15    Comment: Performed at [facility] [address], [place] [address]  CBC     Status: Abnormal   Collection Time: 05/24/20  4:02 AM  Result Value Ref Range   WBC 4.3 4.0 - 10.5 K/uL   RBC 3.52 (L) 4.22 - 5.81 MIL/uL   Hemoglobin 10.1 (L) 13.0 - 17.0 g/dL   HCT 32.2 (L) 39 - 52 %   MCV 91.5 80.0 - 100.0 fL   MCH 28.7  26.0 - 34.0 pg   MCHC 31.4 30.0 - 36.0 g/dL   RDW 14.6 11.5 - 15.5 %   Platelets 96 (L) 150 - 400 K/uL    Comment: REPEATED TO VERIFY PLATELET COUNT CONFIRMED BY SMEAR SPECIMEN CHECKED FOR CLOTS Immature Platelet Fraction may be clinically indicated, consider ordering this additional test [id number]    nRBC 0.0 0.0 - 0.2 %    Comment: Performed at [facility], [place] [address], [place], [place] [address]  ABO/Rh     Status: None   Collection Time: 05/24/20  5:00 AM  Result Value Ref Range   ABO/RH(D)      O POS Performed at [facility] [address] [place],  [address]   Basic metabolic panel     Status: Abnormal   Collection Time: 05/24/20  5:41 AM  Result Value Ref Range   Sodium 137 135 - 145 mmol/L   Potassium 4.5 3.5 - 5.1 mmol/L    Comment: HEMOLYSIS AT THIS LEVEL MAY AFFECT RESULT   Chloride 104 98 - 111 mmol/L   CO2 26 22 - 32 mmol/L   Glucose, Bld 100 (H) 70 - 99 mg/dL    Comment: Glucose reference range applies only to samples taken after fasting for at least 8 hours.   BUN 19 6 - 20 mg/dL   Creatinine, Ser 1.68  (H) 0.61 - 1.24 mg/dL   Calcium 8.0 (L) 8.9 - 10.3 mg/dL   GFR calc non Af Amer 44 (L) >60 mL/min   GFR calc Af Amer 51 (L) >60 mL/min   Anion gap 7 5 - 15    Comment: Performed at [facility] [address] [place],  [address]  Hemoglobin and hematocrit, blood     Status: None   Collection Time: 05/24/20  2:21 PM  Result Value Ref Range   Hemoglobin 13.0 13.0 - 17.0 g/dL    Comment: REPEATED TO VERIFY   HCT 41.3 39 - 52 %    Comment: Performed at [facility] [address], [place],  [address]   No results found.    Assessment/Plan  Internal and external hemorrhoids - Patient with bleeding hemorrhoids, worse since starting eliquis a few months ago. Bleeding particularly worse over the last few weeks since returning to the gym. He currently has no active bleeding, hemoglobin is trending up, and he has not required a blood transfusion. We discussed activity modification (ie avoid heavy lifting, avoid prolonged sitting on the toilet, take in more fiber in diet... info on AVS). I will arrange follow up in our office with colorectal surgeon to discuss elective hemorrhoidectomy. Patient also plans follow up with GI for possible repeat colonoscopy/EGD.  We will sign off, please call with concerns.   Wellington Hampshire, [facility] 05/24/2020, 4:31 PM Please see Amion for pager number during day hours 7:00am-4:30pm

## 2020-05-24 NOTE — ED Notes (Signed)
Breakfast ordered--Bruce Robertson 

## 2020-05-24 NOTE — ED Notes (Signed)
Lunch Tray Ordered @ 1023. 

## 2020-05-24 NOTE — H&P (Signed)
TRH H&P   Patient Demographics:    Bruce Robertson, is a 59 y.o. male  MRN: [id number]   DOB - [birth year]  Admit Date - 05/23/2020  Outpatient Primary MD for the patient is Caren Macadam, MD  Referring MD/NP/PA: Dr Wyvonnia Dusky  Patient coming from: Home  No chief complaint on file.     HPI:    Kashon Kraynak  is a 59 y.o. male, with a history of NICM s/p ICD, OSA, HTN, CAD s/p CABG 2007, DM, CKD, and paroxysmal atrial fibrillation on eliquis , patient presents to ED secondary to bright red blood per rectum, patient reports this has been going on for last 3 weeks, mainly when he moves his stools, does report history of hemorrhoidal bleed in the past, but reported has been not been there for couple years, and it was to much lesser extent, he denies any constipation, diarrhea or abdominal pain, reports his stool is brown, red mixed in, very red with wiping and red in the toilet bowel, denies any melena, coffee-ground emesis, reports he had a bleeding ulcer in his 41s, as well he does report some dizziness and lightheadedness at times, but he denies any chest pain or shortness of breath, he had colonoscopy last year that was significant for internal and external hemorrhoids, and had small polyp which resected, otherwise no acute findings, he did call his cardiologist, and was referred to ED. -In ED, patient was Hemoccult negative, but he was noted to have fresh blood by ED physician on rectal exam, his his hemoglobin with minimal drop, baseline is 14, it was 12.5, with known baseline creatinine of 1.6, triage hospitalist were consulted to admit.    Review of systems:    In addition to the HPI above,  No Fever-chills, No Headache, No changes with Vision or hearing, No problems swallowing food or Liquids, No Chest pain, Cough or Shortness of Breath, No Abdominal pain, No Nausea or  Vommitting, Bowel movements are regular, Patient reports rectal bleed No dysuria, No new skin rashes or bruises, No new joints pains-aches,  No new weakness, tingling, numbness in any extremity, No recent weight gain or loss, No polyuria, polydypsia or polyphagia, No significant Mental Stressors.  A full 10 point Review of Systems was done, except as stated above, all other Review of Systems were negative.   With Past History of the following :    Past Medical History:  Diagnosis Date  . AICD (automatic cardioverter/defibrillator) present   . Anxiety   . Cardiomyopathy    Nonischemic. EF has been about 45%. S/P CABG.;  b.  Echo 4/14: EF 25%, global HK with inf and mid apical AK, restrictive physiology with E/e' > 15 (elevated LV filling pressure), trivial AI/MR, mild to mod LAE, mild RVE, mild reduced RVSF, mild RAE, mod TR, PASP 74 (severe pulmonary HTN)   . CHF (congestive heart failure) ([place])   .  CHF exacerbation ([place]) 07/31/2016  . Colon polyps 2002  . Coronary artery disease   . Depression   . Dyslipidemia    hx (03/20/2017)  . GERD (gastroesophageal reflux disease)   . History of bleeding peptic ulcer   . History of echocardiogram    Echo 10/16:  EF 25-30%, poss non-compaction, diff HK with inf-lat and apical HK, restrictive physio, mild AI, severe LAE, mild RVE with mild reduced RVSF, PASP 42 mmHg  . Hyperplastic rectal polyp   . OSA on CPAP    Mild  . Pneumonia   . Tobacco abuse    Remote      Past Surgical History:  Procedure Laterality Date  . CARDIAC CATHETERIZATION  2007;  2009  . CARDIAC DEFIBRILLATOR PLACEMENT  03/20/2017  . COLONOSCOPY WITH PROPOFOL N/A 01/21/2019   Procedure: COLONOSCOPY WITH PROPOFOL;  Surgeon: Milus Banister, MD;  Location: [facility];  Service: Endoscopy;  Laterality: N/A;  . CORONARY ARTERY BYPASS GRAFT  2007   Had a left main dissection after catheterization. Underwent a saphenous vein graft to the LAD and a saphenous vein graft  to obtuse marginal.  . ICD IMPLANT N/A 03/20/2017   Procedure: ICD Implant;  Surgeon: Constance Haw, MD;  Location: [facility];  Service: Cardiovascular;  Laterality: N/A;  . PATELLAR TENDON REPAIR Right 2005  . POLYPECTOMY  01/21/2019   Procedure: POLYPECTOMY;  Surgeon: Milus Banister, MD;  Location: [facility];  Service: Endoscopy;;      Social History:     Social History   Tobacco Use  . Smoking status: Former Smoker    Packs/day: 0.25    Years: 7.00    Pack years: 1.75  . Smokeless tobacco: Never Used  . Tobacco comment: 03/20/2017 "quit before 2000"  Substance Use Topics  . Alcohol use: Yes    Alcohol/week: 0.0 standard drinks    Comment: twice a month no preference       Family History :     Family History  Problem Relation Age of Onset  . Hypertension Mother   . Osteoarthritis Mother   . Heart failure Father 16  . High blood pressure Father   . Lung cancer Other   . Cancer Maternal Grandmother        uncertain type  . Bipolar disorder Daughter   . Diabetes Daughter   . Cancer Maternal Uncle        uncertain type  . Diabetes Niece   . Asthma Neg Hx      Home Medications:   Prior to Admission medications   Medication Sig Start Date End Date Taking? Authorizing Provider  ACCU-CHEK GUIDE test strip  02/15/20   [provider]  Accu-Chek Softclix Lancets lancets  02/15/20   [provider]  apixaban (ELIQUIS) 5 MG TABS tablet Take 1 tablet (5 mg total) by mouth 2 (two) times daily. 03/28/20   Fenton, Clint R, PA  aspirin EC 81 MG tablet Take 81 mg by mouth daily.    [provider]  blood glucose meter kit and supplies KIT Dispense based on patient and insurance preference. Use up to four times daily as directed. 02/14/20   Caren Macadam, MD  carvedilol (COREG) 12.5 MG tablet Take 1 tablet (12.5 mg total) by mouth 2 (two) times daily. 11/26/19   Camnitz, Ocie Doyne, MD  Multiple Vitamin (MULTIVITAMIN WITH  MINERALS) TABS tablet Take 1 tablet by mouth daily.    [provider]  sacubitril-valsartan (ENTRESTO) 24-26 MG Take 1 tablet by mouth in the morning and at bedtime.  03/30/20   [provider]  simvastatin (ZOCOR) 20 MG tablet Take 20 mg by mouth daily.  02/29/20   [provider]  spironolactone (ALDACTONE) 25 MG tablet Take 25 mg by mouth daily.  03/30/20   [provider]  torsemide (DEMADEX) 20 MG tablet Take 2 tablets (40 mg total) by mouth daily. 10/11/19 04/27/20  Minus Breeding, MD     Allergies:    No Known Allergies   Physical Exam:   Vitals  Blood pressure 112/70, pulse (!) 56, temperature 98.4 F (36.9 C), temperature source Oral, resp. rate 16, height 6' 1"  (1.854 m), weight 102.5 kg, SpO2 100 %.   1. General well developed male, lying in bed in NAD,   2. Normal affect and insight, Not Suicidal or Homicidal, Awake Alert, Oriented X 3.  3. No F.N deficits, ALL C.Nerves Intact, Strength 5/5 all 4 extremities, Sensation intact all 4 extremities, Plantars down going.  4. Ears and Eyes appear Normal, Conjunctivae clear, PERRLA. Moist Oral Mucosa.  5. Supple Neck, No JVD, No cervical lymphadenopathy appriciated, No Carotid Bruits.  6. Symmetrical Chest wall movement, Good air movement bilaterally, CTAB.  7. RRR, No Gallops, Rubs or Murmurs, No Parasternal Heave.  8. Positive Bowel Sounds, Abdomen Soft, No tenderness, No organomegaly appriciated,No rebound -guarding or rigidity.  Usual exam showing external hemorrhoids, with no evidence of bleed, as well I could not appreciate any blood on rectal exam  9.  No Cyanosis, Normal Skin Turgor, No Skin Rash or Bruise.  10. Good muscle tone,  joints appear normal , no effusions, Normal ROM.  11. No Palpable Lymph Nodes in Neck or Axillae    Data Review:    CBC Recent Labs  Lab 05/23/20 1345 05/23/20 2344  WBC 4.2  --   HGB 12.5* 13.3  HCT 39.4 41.9  PLT 132*  --   MCV 87.6  --     MCH 27.8  --   MCHC 31.7  --   RDW 14.6  --    ------------------------------------------------------------------------------------------------------------------  Chemistries  Recent Labs  Lab 05/23/20 1345  NA 137  K 4.5  CL 100  CO2 29  GLUCOSE 96  BUN 19  CREATININE 1.68*  CALCIUM 9.3  AST 28  ALT 21  ALKPHOS 40  BILITOT 1.0   ------------------------------------------------------------------------------------------------------------------ estimated creatinine clearance is 61 mL/min (A) (by C-G formula based on SCr of 1.68 mg/dL (H)). ------------------------------------------------------------------------------------------------------------------ No results for input(s): TSH, T4TOTAL, T3FREE, THYROIDAB in the last 72 hours.  Invalid input(s): FREET3  Coagulation profile No results for input(s): INR, PROTIME in the last 168 hours. ------------------------------------------------------------------------------------------------------------------- No results for input(s): DDIMER in the last 72 hours. -------------------------------------------------------------------------------------------------------------------  Cardiac Enzymes No results for input(s): CKMB, TROPONINI, MYOGLOBIN in the last 168 hours.  Invalid input(s): CK ------------------------------------------------------------------------------------------------------------------    Component Value Date/Time   BNP 356.1 (H) 05/23/2020 2344   BNP 262.4 (H) 02/19/2016 1053     ---------------------------------------------------------------------------------------------------------------  Urinalysis    Component Value Date/Time   COLORURINE YELLOW 04/29/2015 1400   APPEARANCEUR CLEAR 04/29/2015 1400   LABSPEC 1.024 04/29/2015 1400   PHURINE 6.0 04/29/2015 1400   GLUCOSEU NEGATIVE 04/29/2015 1400   HGBUR NEGATIVE 04/29/2015 1400   BILIRUBINUR NEGATIVE 04/29/2015 1400   KETONESUR NEGATIVE 04/29/2015  1400   PROTEINUR NEGATIVE 04/29/2015 1400   UROBILINOGEN 1.0 04/29/2015 1400   NITRITE NEGATIVE 04/29/2015 1400   LEUKOCYTESUR NEGATIVE 04/29/2015  1400    ----------------------------------------------------------------------------------------------------------------   Imaging Results:    No results found.  My personal review of EKG: Rhythm bradycardia , Rate  51 /min, QTc 424 , no Acute ST changes   Assessment & Plan:    Active Problems:   Hyperlipidemia   Cardiomyopathy, dilated, nonischemic (HCC)   Esophageal reflux   OSA (obstructive sleep apnea)   Hx of CABG   Essential hypertension   AICD (automatic cardioverter/defibrillator) present   History of peptic ulcer disease   Paroxysmal atrial fibrillation (HCC)   BRBPR (bright red blood per rectum)   Bright red  blood per rectum -Obliterated to hemorrhoidal bleed, but there is no evidence of any bleed on hemorrhoids on my exam, patient with recent colonoscopy year significant for internal and external hemorrhoids, and small 3 mm resected polyp, patient described amount as large for last 3 weeks, his hemoglobin is stable, given he is on aspirin and Eliquis he will be added at admitted for further evaluation, I will will hold his aspirin and Eliquis for now, and will consult GI for further recommendations.  Chronic systolic CHF. Nonischemic. EF 15-20% by Echo this April . -On Coreg, Entresto and Aldactone, which we will continue . -He appears to be a euvolemic, continue to monitor closely . - S/p ICD -Hold Eliquis for now  History of left main dissection- iatrogenic. S/p CABG. No angina.  CKD stage 3. Creatinine is at baseline  OSA on CPAP  NSVT asymptomatic. On beta blocker.  - S/p ICD  DVT Prophylaxis  SCDs  AM Labs Ordered, also please review Full Orders  Family Communication: Admission, patients condition and plan of care including tests being ordered have been discussed with the patient and wife at bedside who  indicate understanding and agree with the plan and Code Status.  Code Status Full  Likely DC to  Home  Condition GUARDED    Consults called: sent secure chat message to EP[facility]    Admission status: observation  Time spent in minutes : 50 minutes   Phillips Climes M.D on 05/24/2020 at 2:28 AM   [facility] - Office  [phone]

## 2020-05-24 NOTE — Consult Note (Addendum)
[facility] Consult: 12:17 PM 05/24/2020  LOS: 0 days    Referring Provider: Dr Lyman Speller  Primary Care Physician:  Caren Macadam, MD Primary Gastroenterologist:  Dr Ardis Hughs     Reason for Consultation:  Hematochezia.     HPI: Bruce Robertson is a 59 y.o. male.  Hx Non ischemic CM.  EF 20 to 25%.  OSA, on sleep apnea.  ICD placed 2018.  CABG 2007.  DM. 2002 colonoscopy at [place] revealed a hyperplastic polyp. 03/2010 EGD with irregular Z-line, pathology negative for Barrett's.  Mild esophagitis, 2 to 3 cm hiatal hernia. 03/2010 colonoscopy.  Revealed small hemorrhoids, no polyps or cancers. Seen at the office in 12/2018 by Dr. Ardis Hughs regarding mild to moderate rectal bleeding, blood dripping into the toilet, about twice a month.  Dr. Ardis Hughs suspected this was a benign process and likely due to hemorrhoids. 01/21/2019 colonoscopy.  3 mm, sessile polyp (TA, no HGD) removed from ascending colon.  Large internal and external hemorrhoids without active bleeding or thrombosis, more likely sources of his intermittent BPR.  "Consider in office hemorrhoidal banding".  Repeat colon in 2027.    In May, cardiologist at [facility] initiated [place] and Eliquis as in reviewing his cardiac monitor records he had had episodes of PAF which were asymptomatic and occurred during sleep.  Also on 81 ASA.  For 3 weeks he has had increased rectal bleeding.  Passing bright red blood every time he has a bowel movement, twice a day.  The stool itself is brown it is admixed with blood which she sees on the toilet tissue and in the commode.  Occasional lightheadedness after standing up and walking a few steps, this passes.  No chest pain, no dyspnea. He spoke with his cardiology team today who advised him to go to the ED for evaluation of  increased rectal bleeding.  Patient had not reached out to Dr. Ardis Hughs office regarding this problem.  Hgb 13.3 >> 10.1.  Was 14 2 to 3 months ago.   Mild AKI but BUN normal.     Pt not on PPI or H2B.  Does not use NSAIDs.   Past Medical History:  Diagnosis Date  . AICD (automatic cardioverter/defibrillator) present   . Anxiety   . Cardiomyopathy    Nonischemic. EF has been about 45%. S/P CABG.;  b.  Echo 4/14: EF 25%, global HK with inf and mid apical AK, restrictive physiology with E/e' > 15 (elevated LV filling pressure), trivial AI/MR, mild to mod LAE, mild RVE, mild reduced RVSF, mild RAE, mod TR, PASP 74 (severe pulmonary HTN)   . CHF (congestive heart failure) ([place])   . CHF exacerbation ([place]) 07/31/2016  . Colon polyps 2002  . Coronary artery disease   . Depression   . Dyslipidemia    hx (03/20/2017)  . GERD (gastroesophageal reflux disease)   . History of bleeding peptic ulcer   . History of echocardiogram    Echo 10/16:  EF 25-30%, poss non-compaction, diff HK with inf-lat and apical HK, restrictive physio, mild AI,  severe LAE, mild RVE with mild reduced RVSF, PASP 42 mmHg  . Hyperplastic rectal polyp   . OSA on CPAP    Mild  . Pneumonia   . Tobacco abuse    Remote    Past Surgical History:  Procedure Laterality Date  . CARDIAC CATHETERIZATION  2007;  2009  . CARDIAC DEFIBRILLATOR PLACEMENT  03/20/2017  . COLONOSCOPY WITH PROPOFOL N/A 01/21/2019   Procedure: COLONOSCOPY WITH PROPOFOL;  Surgeon: Milus Banister, MD;  Location: [facility];  Service: Endoscopy;  Laterality: N/A;  . CORONARY ARTERY BYPASS GRAFT  2007   Had a left main dissection after catheterization. Underwent a saphenous vein graft to the LAD and a saphenous vein graft to obtuse marginal.  . ICD IMPLANT N/A 03/20/2017   Procedure: ICD Implant;  Surgeon: Constance Haw, MD;  Location: [facility];  Service: Cardiovascular;  Laterality: N/A;  . PATELLAR TENDON REPAIR Right 2005  .  POLYPECTOMY  01/21/2019   Procedure: POLYPECTOMY;  Surgeon: Milus Banister, MD;  Location: [facility];  Service: Endoscopy;;    Prior to Admission medications   Medication Sig Start Date End Date Taking? Authorizing Provider  apixaban (ELIQUIS) 5 MG TABS tablet Take 1 tablet (5 mg total) by mouth 2 (two) times daily. 03/28/20  Yes Fenton, Clint R, PA  aspirin EC 81 MG tablet Take 81 mg by mouth daily.   Yes [provider]  carvedilol (COREG) 12.5 MG tablet Take 1 tablet (12.5 mg total) by mouth 2 (two) times daily. 11/26/19  Yes Camnitz, Ocie Doyne, MD  Multiple Vitamin (MULTIVITAMIN WITH MINERALS) TABS tablet Take 1 tablet by mouth daily.   Yes [provider]  sacubitril-valsartan (ENTRESTO) 24-26 MG Take 1 tablet by mouth in the morning and at bedtime.  03/30/20  Yes [provider]  simvastatin (ZOCOR) 40 MG tablet Take 40 mg by mouth daily. 05/08/20  Yes [provider]  spironolactone (ALDACTONE) 25 MG tablet Take 25 mg by mouth daily.  03/30/20  Yes [provider]  torsemide (DEMADEX) 20 MG tablet Take 2 tablets (40 mg total) by mouth daily. 10/11/19 05/24/20 Yes Minus Breeding, MD  ACCU-CHEK GUIDE test strip  02/15/20   [provider]  Accu-Chek Softclix Lancets lancets  02/15/20   [provider]  blood glucose meter kit and supplies KIT Dispense based on patient and insurance preference. Use up to four times daily as directed. 02/14/20   Caren Macadam, MD    Scheduled Meds: . carvedilol  12.5 mg Oral BID  . methylPREDNISolone acetate  40 mg Intra-articular Once  . simvastatin  20 mg Oral Daily  . spironolactone  25 mg Oral Daily   Infusions:  PRN Meds:    Allergies as of 05/23/2020  . (No Known Allergies)    Family History  Problem Relation Age of Onset  . Hypertension Mother   . Osteoarthritis Mother   . Heart failure Father 7  . High blood pressure Father   . Lung cancer Other   . Cancer Maternal  Grandmother        uncertain type  . Bipolar disorder Daughter   . Diabetes Daughter   . Cancer Maternal Uncle        uncertain type  . Diabetes Niece   . Asthma Neg Hx     Social History   Socioeconomic History  . Marital status: Divorced    Spouse name: Not on file  . Number of children: 5  .  Years of education: Not on file  . Highest education level: Not on file  Occupational History  . Occupation: Civil engineer, contracting    Employer: [employer]  Tobacco Use  . Smoking status: Former Smoker    Packs/day: 0.25    Years: 7.00    Pack years: 1.75  . Smokeless tobacco: Never Used  . Tobacco comment: 03/20/2017 "quit before 2000"  Vaping Use  . Vaping Use: Former  . Devices: vaped for about a month  Substance and Sexual Activity  . Alcohol use: Yes    Alcohol/week: 0.0 standard drinks    Comment: twice a month no preference  . Drug use: Yes    Types: Marijuana    Comment: 03/20/2017 "joint qod"  . Sexual activity: Not Currently  Other Topics Concern  . Not on file  Social History Narrative   Single   Social Determinants of Health   Financial Resource Strain:   . Difficulty of Paying Living Expenses:   Food Insecurity:   . Worried About Charity fundraiser in the Last Year:   . Arboriculturist in the Last Year:   Transportation Needs: No Transportation Needs  . Lack of Transportation (Medical): No  . Lack of Transportation (Non-Medical): No  Physical Activity: Sufficiently Active  . Days of Exercise per Week: 4 days  . Minutes of Exercise per Session: 40 min  Stress:   . Feeling of Stress :   Social Connections:   . Frequency of Communication with Friends and Family:   . Frequency of Social Gatherings with Friends and Family:   . Attends Religious Services:   . Active Member of Clubs or Organizations:   . Attends Archivist Meetings:   Marland Kitchen Marital Status:   Intimate Partner Violence:   . Fear of Current or Ex-Partner:   . Emotionally Abused:   Marland Kitchen  Physically Abused:   . Sexually Abused:     REVIEW OF SYSTEMS: Constitutional: No profound weakness or fatigue.  In fact since starting Entresto his energy level, breathing is much improved and he is very happy with this. ENT:  No nose bleeds Pulm: No DOE.  No PND.  Compliant with CPAP. CV:  No palpitations, no LE edema.  No angina. GU:  No hematuria, no frequency GI: See HPI.  Appetite is good.  No nausea, vomiting, dysphagia. Heme: Other than the GI bleeding has not had unusual or excessive bleeding or bruising. Transfusions: No prior blood transfusions. Neuro: A few episodes of short-lived dizziness as per HPI.  No syncope, no seizures.  No headaches, no peripheral tingling or numbness Derm:  No itching, no rash or sores.  Endocrine:  No sweats or chills.  No polyuria or dysuria Immunization: Not queried. Travel:  None beyond local counties in last few months.    PHYSICAL EXAM: Vital signs in last 24 hours: Vitals:   05/24/20 0800 05/24/20 1040  BP: 103/67 106/79  Pulse: (!) 55 (!) 56  Resp: 16 16  Temp:    SpO2: 100% 100%   Wt Readings from Last 3 Encounters:  05/23/20 102.5 kg  04/27/20 105.7 kg  03/28/20 104.8 kg    General: Overweight, pleasant, calm, healthy-appearing. Head: No facial asymmetry or swelling.  No signs of head trauma. Eyes: No conjunctival pallor or icterus Ears: Not hard of hearing Nose: No congestion or discharge Mouth: Mucosa is moist, pink, clear.  Good dentition. Neck: No JVD, masses, thyromegaly. Lungs: Clear bilaterally. Heart: Telemetry monitor shows base  of sinus rhythm with occasional PVCs but no A. fib. Abdomen: Soft, nontender, nondistended.  No HSM, masses, bruits, hernias.  Active bowel sounds..   Rectal: Visibly has small, nonthrombosed, nonbleeding external hemorrhoids.  Did not perform DRE. Musc/Skeltl: No joint redness, swelling or gross deformities. Extremities: No CCE. Neurologic: Fully alert and oriented x3.  Good historian.   Fluid speech.  Moves all 4 limbs without tremor or weakness. Skin: No rash, no sores, no suspicious lesions. Tattoos: None observed. Nodes: No cervical adenopathy Psych: Calm, pleasant, fluid speech.  Intake/Output from previous day: 07/20 0701 - 07/21 0700 In: 250 [IV Piggyback:250] Out: -  Intake/Output this shift: No intake/output data recorded.  LAB RESULTS: Recent Labs    05/23/20 1345 05/23/20 2344 05/24/20 0402  WBC 4.2  --  4.3  HGB 12.5* 13.3 10.1*  HCT 39.4 41.9 32.2*  PLT 132*  --  96*   BMET Lab Results  Component Value Date   NA 137 05/24/2020   NA 142 05/24/2020   NA 137 05/23/2020   K 4.5 05/24/2020   K 2.8 (L) 05/24/2020   K 4.5 05/23/2020   CL 104 05/24/2020   CL 116 (H) 05/24/2020   CL 100 05/23/2020   CO2 26 05/24/2020   CO2 21 (L) 05/24/2020   CO2 29 05/23/2020   GLUCOSE 100 (H) 05/24/2020   GLUCOSE 91 05/24/2020   GLUCOSE 96 05/23/2020   BUN 19 05/24/2020   BUN 14 05/24/2020   BUN 19 05/23/2020   CREATININE 1.68 (H) 05/24/2020   CREATININE 1.17 05/24/2020   CREATININE 1.68 (H) 05/23/2020   CALCIUM 8.0 (L) 05/24/2020   CALCIUM 6.1 (LL) 05/24/2020   CALCIUM 9.3 05/23/2020   LFT Recent Labs    05/23/20 1345  PROT 7.0  ALBUMIN 4.0  AST 28  ALT 21  ALKPHOS 40  BILITOT 1.0   PT/INR Lab Results  Component Value Date   INR 1.14 06/09/2018   INR 1.0 RATIO 11/19/2006   Hepatitis Panel No results for input(s): HEPBSAG, HCVAB, HEPAIGM, HEPBIGM in the last 72 hours. C-Diff No components found for: CDIFF Lipase     Component Value Date/Time   LIPASE 32 04/29/2015 1406    Drugs of Abuse     Component Value Date/Time   LABOPIA NONE DETECTED 08/01/2016 0825   COCAINSCRNUR NONE DETECTED 08/01/2016 0825   LABBENZ NONE DETECTED 08/01/2016 0825   AMPHETMU NONE DETECTED 08/01/2016 0825   THCU POSITIVE (A) 08/01/2016 0825   LABBARB NONE DETECTED 08/01/2016 0825     RADIOLOGY STUDIES: No results found.   IMPRESSION:   *     Hemorrhoidal bleeding suspected, this has accelerated in the setting of Eliquis which was added in May for isolated episodes of PAF.  01/2019 colonoscopy with small tubular adenoma removed and large internal/external hemorrhoids.  Dr. Ardis Hughs said to consider in office hemorrhoidal banding but this has not taken place.  *    Anemia.  Today's Hb of 10.1 is down 3 g within 5 hours.  Down about 4 g from baseline last month.  *    Cardiomyopathy.  ICD in place.  CABG 2007. His LVEF as of echo 02/2020 was 15 to 20% with severely elevated pulmonary pressures.  Moderate reduction in right ventricular systolic function.  Severe tricuspid regurg, mild aortic valve regurg, mild mitral valve regurge.  Patient's respiratory status and sense of energy and wellbeing greatly improved since initiation of Entresto in May.  *    Eliquis since May  for isolated PAF.  Last dose of Eliquis was 7/20.  *   Mild AKI w normal BUN.    *    Thrombocytopenia.  This is an intermittent issue and dates back to at least 2011 but previous lows in the 140s, platelets have never measured less than 100.  Had normal-looking liver in 2016 and upper abdominal imaging seen during 02/29/2020 CT chest showed no abnormalities.   PLAN:     *   It is unlikely he needs to undergo repeat colonoscopy but will need intervention on his hemorrhoids.  *   Daily PPI for history of esophagitisonpriorEGD and multiple cardiac comorbidities in a patient on chronic anticoagulation.  Protection against future upper GI bleeding *   Ok to eat so ordered heart healthy diet.   Azucena Freed  05/24/2020, 12:17 PM Phone [phone]  GI ATTENDING  History, laboratories, x-rays, prior colonoscopy report reviewed.  Agree with comprehensive consultation note as outlined above.  IMPRESSION: 1.  Chronic rectal bleeding secondary to large hemorrhoids in the face of chronic anticoagulation.  Colonoscopy last year to evaluate the same with the principal finding  being hemorrhoids. 2.  Acute on chronic anemia secondary to hemorrhoidal bleeding in the face of chronic anticoagulation 3.  Significant cardiac problems including cardiomyopathy and paroxysmal atrial fibrillation 4.  Mild esophagitis on prior EGD  RECOMMENDATIONS: 1.  Surgical consultation for definitive hemorrhoidectomy.  Given his cardiac issues and current interruption of anticoagulation therapy, might be optimal to operate during this admission. 2.  Transfused to the clinically appropriate hemoglobin level 3.  Daily PPI for upper GI mucosal protection.  Pantoprazole 40 mg daily, for example.  We will sign off.  Docia Chuck. Geri Seminole., M.D. [facility] Division of Gastroenterology

## 2020-06-12 ENCOUNTER — Ambulatory Visit: Payer: Self-pay | Admitting: Surgery

## 2020-06-12 DIAGNOSIS — I482 Chronic atrial fibrillation, unspecified: Secondary | ICD-10-CM | POA: Diagnosis not present

## 2020-06-12 DIAGNOSIS — I428 Other cardiomyopathies: Secondary | ICD-10-CM | POA: Diagnosis not present

## 2020-06-12 DIAGNOSIS — K642 Third degree hemorrhoids: Secondary | ICD-10-CM | POA: Diagnosis not present

## 2020-06-12 DIAGNOSIS — Z7901 Long term (current) use of anticoagulants: Secondary | ICD-10-CM | POA: Diagnosis not present

## 2020-06-12 DIAGNOSIS — K648 Other hemorrhoids: Secondary | ICD-10-CM | POA: Diagnosis not present

## 2020-06-12 NOTE — H&P (Signed)
Bruce Robertson Appointment: 06/12/2020 1:45 PM Location: [facility] Patient #: [id number] DOB: 14-Apr-1961 Single / Language: Bruce Robertson / Race: Black or African American Male  History of Present Illness Bruce Hector MD; 06/12/2020 2:34 PM) The patient is a 59 year old male who presents for bright red blood per rectum. Note for "Bright red blood per rectum": ` ` ` Patient sent for surgical consultation at the request of Bruce Robertson  Chief Complaint: Worsening rectal bleeding. Probable hemorrhoids. ` ` The patient is a patient with many medical issues. Primarily cardiac in nature. Had coronary disease and required catheterization and emergency bypass grafting. Decreased ejection fraction. Followed around a few the [place] [facility]. Occasionally due to cardiology as well. Recently had his medications adjusted including Eliquis. Had episodes approximately defibrillation testing hours at a time this year. Started on Eliquis in May. History of colonoscopy last year by Dr. Oretha Caprice was [facility] gastroenterology. One small polyp. Moderate internal and external hemorrhoids noted. Had worsening rectal bleeding. Came in the hospital. Hemorrhoid etiology suspected. Hemoglobin not severely low. Recommended close outpatient follow-up.  Patient comes in today with his wife. He notes he moves his bowels once or twice a day. He is not on a fiber supplement. I told to do Metamucil. I encouraged him to continue that. Remains on Eliquis. He gets significant bleeding at least every other day. He does feel his hemorrhoids pop out with every bowel movement. He notes this is been going on for years but is much more prominent this year, especially after being anticoagulated. He's never had any banding treatments. No personal nor family history of Robertson/colon cancer, inflammatory bowel disease, irritable bowel syndrome, allergy such as Celiac Sprue, dietary/dairy problems, colitis,  ulcers nor gastritis. No recent sick contacts/gastroenteritis. No travel outside the country. No changes in diet. No dysphagia to solids or liquids. No significant heartburn or reflux. No melena, hematemesis, coffee ground emesis. He recalls being diagnosed with an ulcer but has not had any bleeding issues in quite some time. On omeprazole. He can walk about 10-15 minutes before having to stop. He's been trying to work out in the gym but is hesitant to be too active with his heart issues in rectal bleeding  (Review of systems as stated in this history (HPI) or in the review of systems. Otherwise all other 12 point ROS are negative) ` ` `  This patient encounter took 40 minutes today to perform the following: obtain history, perform exam, review outside records, interpret tests & imaging, counsel the patient on their diagnosis; and, document this encounter, including findings & plan in the electronic health record (EHR).   Past Surgical History Bruce Robertson, CMA; 06/12/2020 2:06 PM) Colon Polyp Removal - Colonoscopy Coronary Artery Bypass Graft Knee Surgery Right.  Diagnostic Studies History Bruce Robertson, CMA; 06/12/2020 2:06 PM) Colonoscopy 1-5 years ago  Allergies Bruce Robertson, CMA; 06/12/2020 2:07 PM) No Known Drug Allergies [06/12/2020]:  Medication History (Bruce Robertson, CMA; 06/12/2020 2:08 PM) Carvedilol (12.5MG  Tablet, Oral) Active. Eliquis (5MG  Tablet, Oral) Active. Entresto (24-26MG  Tablet, Oral) Active. Omeprazole (40MG  Capsule DR, Oral) Active. Simvastatin (40MG  Tablet, Oral) Active. Spironolactone (25MG  Tablet, Oral) Active. Torsemide (20MG  Tablet, Oral) Active. Baby Aspirin (81MG  Tablet Chewable, Oral) Active. Multivitamin (Oral) Active. Medications Reconciled  Social History Bruce Robertson, CMA; 06/12/2020 2:06 PM) Alcohol use Recently quit alcohol use. Caffeine use Carbonated beverages. Illicit drug use Prefer to discuss with  provider. Tobacco use Former smoker.  Family History Bruce Robertson, [place]; 06/12/2020  2:06 PM) Colon Polyps Father. Depression Daughter, Mother. Diabetes Mellitus Daughter. Heart Disease Father. Hypertension Mother.  Other Problems Bruce Robertson, CMA; 06/12/2020 2:06 PM) Atrial Fibrillation Congestive Heart Failure Gastric Ulcer Gastroesophageal Reflux Disease Hemorrhoids Sleep Apnea Vascular Disease     Review of Systems (Bruce Robertson CMA; 06/12/2020 2:06 PM) General Present- Chills and Fatigue. Not Present- Appetite Loss, Fever, Night Sweats, Weight Gain and Weight Loss. Skin Not Present- Change in Wart/Mole, Dryness, Hives, Jaundice, New Lesions, Non-Healing Wounds, Rash and Ulcer. HEENT Present- Oral Ulcers and Seasonal Allergies. Not Present- Earache, Hearing Loss, Hoarseness, Nose Bleed, Ringing in the Ears, Sinus Pain, Sore Throat, Visual Disturbances, Wears glasses/contact lenses and Yellow Eyes. Respiratory Present- Difficulty Breathing. Not Present- Bloody sputum, Chronic Cough, Snoring and Wheezing. Cardiovascular Present- Difficulty Breathing Lying Down and Shortness of Breath. Not Present- Chest Pain, Leg Cramps, Palpitations, Rapid Heart Rate and Swelling of Extremities. Gastrointestinal Present- Bloating, Bloody Stool, Constipation, Excessive gas, Hemorrhoids, Indigestion, Nausea, Rectal Pain and Vomiting. Not Present- Abdominal Pain, Change in Bowel Habits, Chronic diarrhea, Difficulty Swallowing and Gets full quickly at meals. Male Genitourinary Not Present- Blood in Urine, Change in Urinary Stream, Frequency, Impotence, Nocturia, Painful Urination, Urgency and Urine Leakage. Musculoskeletal Present- Joint Pain, Joint Stiffness and Muscle Weakness. Not Present- Back Pain, Muscle Pain and Swelling of Extremities. Neurological Present- Headaches, Numbness, Tingling, Tremor, Trouble walking and Weakness. Not Present- Decreased Memory, Fainting and  Seizures. Psychiatric Not Present- Anxiety, Bipolar, Change in Sleep Pattern, Depression, Fearful and Frequent crying. Endocrine Not Present- Cold Intolerance, Excessive Hunger, Hair Changes, Heat Intolerance, Hot flashes and New Diabetes. Hematology Present- Blood Thinners, Easy Bruising and Excessive bleeding. Not Present- Gland problems, HIV and Persistent Infections.  Vitals (Bruce Robertson CMA; 06/12/2020 2:07 PM) 06/12/2020 2:06 PM Weight: 235 lb Height: 72in Body Surface Area: 2.28 m Body Mass Index: 31.87 kg/m  Pulse: 88 (Regular)  BP: 98/50(Sitting, Left Arm, Standard)        Physical Exam Bruce Hector MD; 06/12/2020 2:19 PM)  General Mental Status-Alert. General Appearance-Not in acute distress, Not Sickly. Orientation-Oriented X3. Hydration-Well hydrated. Voice-Normal.  Integumentary Global Assessment Upon inspection and palpation of skin surfaces of the - Axillae: non-tender, no inflammation or ulceration, no drainage. and Distribution of scalp and body hair is normal. General Characteristics Temperature - normal warmth is noted.  Head and Neck Head-normocephalic, atraumatic with no lesions or palpable masses. Face Global Assessment - atraumatic, no absence of expression. Neck Global Assessment - no abnormal movements, no bruit auscultated on the right, no bruit auscultated on the left, no decreased range of motion, non-tender. Trachea-midline. Thyroid Gland Characteristics - non-tender.  Eye Eyeball - Left-Extraocular movements intact, No Nystagmus - Left. Eyeball - Right-Extraocular movements intact, No Nystagmus - Right. Cornea - Left-No Hazy - Left. Cornea - Right-No Hazy - Right. Sclera/Conjunctiva - Left-No scleral icterus, No Discharge - Left. Sclera/Conjunctiva - Right-No scleral icterus, No Discharge - Right. Pupil - Left-Direct reaction to light normal. Pupil - Right-Direct reaction to light  normal.  ENMT Ears Pinna - Left - no drainage observed, no generalized tenderness observed. Pinna - Right - no drainage observed, no generalized tenderness observed. Nose and Sinuses External Inspection of the Nose - no destructive lesion observed. Inspection of the nares - Left - quiet respiration. Inspection of the nares - Right - quiet respiration. Mouth and Throat Lips - Upper Lip - no fissures observed, no pallor noted. Lower Lip - no fissures observed, no pallor noted. Nasopharynx - no discharge present. Oral  Cavity/Oropharynx - Tongue - no dryness observed. Oral Mucosa - no cyanosis observed. Hypopharynx - no evidence of airway distress observed.  Chest and Lung Exam Inspection Movements - Normal and Symmetrical. Accessory muscles - No use of accessory muscles in breathing. Palpation Palpation of the chest reveals - Non-tender. Auscultation Breath sounds - Normal and Clear.  Cardiovascular Auscultation Rhythm - Regular. Murmurs & Other Heart Sounds - Auscultation of the heart reveals - No Murmurs and No Systolic Clicks.  Abdomen Inspection Inspection of the abdomen reveals - No Visible peristalsis and No Abnormal pulsations. Umbilicus - No Bleeding, No Urine drainage. Palpation/Percussion Palpation and Percussion of the abdomen reveal - Soft, Non Tender, No Rebound tenderness, No Rigidity (guarding) and No Cutaneous hyperesthesia. Note: Abdomen soft. Nontender. Not distended. Umbilical hernia sensitive a reducible. No incisional hernias. No guarding.  Male Genitourinary Sexual Maturity Tanner 5 - Adult hair pattern and Adult penile size and shape. Note: No inguinal hernias. Normal external genitalia. Epididymi, testes, and spermatic cords normal without any masses.  Rectal Note: Enlarged internal hemorrhoids. Right anterior greater than left lateral greater than right posterior. Right anterior grade 3. Left probable grade 3 as well. Right posterior grade 2.  No  fissure. No fistula. No abscess. No pilonidal disease. Normal sphincter tone. Lites digital rectal exam. No other rectal masses found to 6 cm by digital exam  Peripheral Vascular Upper Extremity Inspection - Left - No Cyanotic nailbeds - Left, Not Ischemic. Inspection - Right - No Cyanotic nailbeds - Right, Not Ischemic.  Neurologic Neurologic evaluation reveals -normal attention span and ability to concentrate, able to name objects and repeat phrases. Appropriate fund of knowledge , normal sensation and normal coordination. Mental Status Affect - not angry, not paranoid. Cranial Nerves-Normal Bilaterally. Gait-Normal.  Neuropsychiatric Mental status exam performed with findings of-able to articulate well with normal speech/language, rate, volume and coherence, thought content normal with ability to perform basic computations and apply abstract reasoning and no evidence of hallucinations, delusions, obsessions or homicidal/suicidal ideation.  Musculoskeletal Global Assessment Spine, Ribs and Pelvis - no instability, subluxation or laxity. Right Upper Extremity - no instability, subluxation or laxity.  Lymphatic Head & Neck  General Head & Neck Lymphatics: Bilateral - Description - No Localized lymphadenopathy. Axillary  General Axillary Region: Bilateral - Description - No Localized lymphadenopathy. Femoral & Inguinal  Generalized Femoral & Inguinal Lymphatics: Left - Description - No Localized lymphadenopathy. Right - Description - No Localized lymphadenopathy.   Results Bruce Hector MD; 06/12/2020 2:35 PM) Procedures  Name Value Date Hemorrhoids Procedure Internal exam: Internal Hemorroids ( non-bleeding) Internal Hemorrhoids (bleeding) prolapse Other: Enlarged internal hemorrhoids. Right anterior greater than left lateral greater than right posterior. Right anterior grade 3. Left probable grade 3 as well. Right posterior grade  2............Marland KitchenNo fissure. No fistula. No abscess. No pilonidal disease. Normal sphincter tone. Lites digital rectal exam. 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  Would like clearance from and make sure his cardiac function is stable. Usually do outpatient surgery but will defer to them for the need for overnight stay.  Current Plans I recommended obtaining preoperative cardiac clearance for recommendations on management of anticoagualtion perioperatively:  1. Timing of holding anticoagulation 2. Need for any bridge therapy (SQ enoxaparin, IV heparin, IV Aggrastat, etc) preop/postop. 3. Desired  timing of resumption of anticoagulation.  In general from Dr. Johney Maine' standpoint:  Aspirin is okay to continue perioperatively (81mg  or 325mg ) & does not need to be held  Hold warfarin 5 dayspreoperatively. Consider PT/INR level on arrival to short stay the day of surgery. No need to check PT/INR level on preop visit  Hold P2Y12 inibitors such as clopidrogel (Plavix) 4 dayspreoperatively  Hold direct thrombin / factor Xa inhibitors (Xerelto, Pradaxa, Eliquis, etc) 2 dayspreoperatively   Request clearance by cardiology to better assess operative risk & see if a reevaluation, further workup, etc is needed. Also recommendations on how medications such as for anticoagulation and blood pressure should be managed/held/restarted after surgery.   PROLAPSED INTERNAL HEMORRHOIDS, GRADE 3 (K64.2) Impression: Worsening hemorrhoids with regular prolapse and bleeding almost with every bowel movement. Significant enough to require hospitalization. Need for anticoagulation with intermittent proximal atrial fibrillation in the setting of decreased  cardiac function.  I think he would benefit from surgery. Hemorrhoidal ligation/pexy. Usually an outpatient surgery and was cardiology wants to watch him overnight.  Usually hold Eliquis 2 days preop and the resume postoperative day 1. Fracture cardiology is okay with it.  He notes hemorrhoids having on and off for years and is getting to be a daily problem. He is ready to proceed with surgery. Discussed with the patient is wife. They wish to proceed with surgery. We will double check there are no issues from a cardiology standpoint. He is recently transitioned over to [facility] for his cardiology care given his complex cardiac situation with atrial fibrillation and decreased ejection fraction   ENCOUNTER FOR PREOPERATIVE EXAMINATION FOR GENERAL SURGICAL PROCEDURE (Z01.818)  Current Plans You are being scheduled for surgery- Our schedulers will call you.  You should hear from our office's scheduling department within 5 working days about the location, date, and time of surgery. We try to make accommodations for patient's preferences in scheduling surgery, but sometimes the OR schedule or the surgeon's schedule prevents [place] from making those accommodations.  If you have not heard from our office [phone]) in 5 working days, call the office and ask for your surgeon's nurse.  If you have other questions about your diagnosis, plan, or surgery, call the office and ask for your surgeon's nurse.  Pt Education - [facility] Rectal Prep for Anorectal outpatient/office surgery: discussed with patient and provided information. Pt Education - [facility] Rectal Surgery HCI (Kalany Diekmann): discussed with patient and provided information.  INTERNAL BLEEDING HEMORRHOIDS (K64.8)  Current Plans ANOSCOPY, DIAGNOSTIC (99833) Pt Education - [facility] Hemorrhoids (Carrick Rijos): discussed with patient and provided information. Pt Education - Pamphlet Given - The Hemorrhoid Book: discussed with patient and provided information. The  anatomy & physiology of the anorectal region was discussed. The pathophysiology of hemorrhoids and differential diagnosis was discussed. Natural history risks without surgery was discussed. I stressed the importance of a bowel regimen to have daily soft bowel movements to minimize progression of disease. Interventions such as sclerotherapy & banding were discussed.  The patient's symptoms are not adequately controlled by medicines and other non-operative treatments. I feel the risks & problems of no surgery outweigh the operative risks; therefore, I recommended surgery to treat the hemorrhoids by ligation, pexy, and possible resection.  Risks such as bleeding, infection, urinary difficulties, need for further treatment, heart attack, death, and other risks were discussed. I noted a good likelihood this will help address the problem. Goals of post-operative recovery were discussed as well. Possibility that this will not correct all symptoms  was explained. Post-operative pain, bleeding, constipation, and other problems after surgery were discussed. We will work to minimize complications. Educational handouts further explaining the pathology, treatment options, and bowel regimen were given as well. Questions were answered. The patient expresses understanding & wishes to proceed with surgery.   CHRONIC EPISODIC ATRIAL FIBRILLATION (I48.20)   CHRONIC ANTICOAGULATION (Z79.01)  Bruce Hector, MD, FACS, MASCRS Gastrointestinal and Minimally Invasive Surgery  [facility] [address] [place], [place] [address] [phone] Fax [phone] Main/Paging  CONTACT INFORMATION: Weekday (9AM-5PM) concerns: Call [facility] main office at [phone] Weeknight (5PM-9AM) or Weekend/Holiday concerns: Check [email or web address] for General Surgery [facility] coverage (Please, do not use SecureChat as it is not reliable communication to operating surgeons for immediate patient  care)

## 2020-06-13 ENCOUNTER — Other Ambulatory Visit: Payer: Self-pay

## 2020-06-13 ENCOUNTER — Ambulatory Visit (INDEPENDENT_AMBULATORY_CARE_PROVIDER_SITE_OTHER): Payer: Medicare HMO | Admitting: Internal Medicine

## 2020-06-13 ENCOUNTER — Ambulatory Visit: Payer: Medicare HMO | Admitting: Pulmonary Disease

## 2020-06-13 DIAGNOSIS — R0602 Shortness of breath: Secondary | ICD-10-CM | POA: Diagnosis not present

## 2020-06-13 LAB — PULMONARY FUNCTION TEST
DL/VA % pred: 122 %
DL/VA: 5.17 ml/min/mmHg/L
DLCO cor % pred: 94 %
DLCO cor: 29.2 ml/min/mmHg
DLCO unc % pred: 80 %
DLCO unc: 24.67 ml/min/mmHg
FEF 25-75 Post: 2.83 L/sec
FEF 25-75 Pre: 2.69 L/sec
FEF2575-%Change-Post: 5 %
FEF2575-%Pred-Post: 85 %
FEF2575-%Pred-Pre: 80 %
FEV1-%Change-Post: 2 %
FEV1-%Pred-Post: 71 %
FEV1-%Pred-Pre: 69 %
FEV1-Post: 2.55 L
FEV1-Pre: 2.48 L
FEV1FVC-%Change-Post: 0 %
FEV1FVC-%Pred-Pre: 104 %
FEV6-%Change-Post: 4 %
FEV6-%Pred-Post: 69 %
FEV6-%Pred-Pre: 66 %
FEV6-Post: 3.05 L
FEV6-Pre: 2.93 L
FEV6FVC-%Change-Post: 0 %
FEV6FVC-%Pred-Post: 102 %
FEV6FVC-%Pred-Pre: 103 %
FVC-%Change-Post: 1 %
FVC-%Pred-Post: 67 %
FVC-%Pred-Pre: 66 %
FVC-Post: 3.07 L
FVC-Pre: 3.01 L
Post FEV1/FVC ratio: 83 %
Post FEV6/FVC ratio: 100 %
Pre FEV1/FVC ratio: 83 %
Pre FEV6/FVC Ratio: 100 %
RV % pred: 112 %
RV: 2.64 L
TLC % pred: 76 %
TLC: 5.84 L

## 2020-06-13 NOTE — Progress Notes (Signed)
Full PFT performed today. °

## 2020-06-14 DIAGNOSIS — Z20822 Contact with and (suspected) exposure to covid-19: Secondary | ICD-10-CM | POA: Diagnosis not present

## 2020-06-22 ENCOUNTER — Encounter: Payer: Self-pay | Admitting: Pulmonary Disease

## 2020-06-22 ENCOUNTER — Other Ambulatory Visit: Payer: Self-pay

## 2020-06-22 ENCOUNTER — Ambulatory Visit (INDEPENDENT_AMBULATORY_CARE_PROVIDER_SITE_OTHER): Payer: Medicare HMO | Admitting: Pulmonary Disease

## 2020-06-22 VITALS — BP 126/76 | HR 86 | Temp 98.3°F | Ht 72.0 in | Wt 236.6 lb

## 2020-06-22 DIAGNOSIS — G4733 Obstructive sleep apnea (adult) (pediatric): Secondary | ICD-10-CM | POA: Diagnosis not present

## 2020-06-22 DIAGNOSIS — J984 Other disorders of lung: Secondary | ICD-10-CM

## 2020-06-22 DIAGNOSIS — I5022 Chronic systolic (congestive) heart failure: Secondary | ICD-10-CM

## 2020-06-22 DIAGNOSIS — R0600 Dyspnea, unspecified: Secondary | ICD-10-CM

## 2020-06-22 DIAGNOSIS — R0609 Other forms of dyspnea: Secondary | ICD-10-CM

## 2020-06-22 DIAGNOSIS — I420 Dilated cardiomyopathy: Secondary | ICD-10-CM | POA: Diagnosis not present

## 2020-06-22 NOTE — Assessment & Plan Note (Signed)
No CPAP compliant report on file today We have contacted [employer] to get the patient added to [place] Patient reports daily compliance with no issues to CPAP therapy at this time  Plan: Recommend to continue to use CPAP every day

## 2020-06-22 NOTE — Assessment & Plan Note (Signed)
Restrictive component on pulmonary function testing  Plan: We will continue to clinically monitor Encourage patient to increase overall physical activity and work to reduce weight

## 2020-06-22 NOTE — Assessment & Plan Note (Signed)
Patient continues to have persistent complaint of dyspnea  Dyspnea is almost certainly to be multifactorial.  Patient does have aspect of restrictive lung disease.  I believe unfortunately right now largest components are cardiovascular in nature given his congestive heart failure with reduced ejection fraction.  Weights are also up 10 pounds over the last month.  Plan: Patient to contact [facility] congestive heart failure clinic today to ensure he has upcoming appointment to notify them that his weights are up 10 pounds Continue diuretics We will route note to Dr. [name] as well as Juluis Rainier 3-80-month follow-up with our office today

## 2020-06-22 NOTE — Assessment & Plan Note (Signed)
Plan: Schedule appointment with [facility]

## 2020-06-22 NOTE — Progress Notes (Signed)
_0  ID: [name], male    DOB: 1961/02/26, 59 y.o.   MRN: [id number]  Chief Complaint  Patient presents with  . Follow-up    pt states dizzy ,pt stats when he stands he feels light head    Referring provider: Caren Macadam, MD  HPI:  59 year old male former smoker initially referred to our office for dyspnea in May/2021  PMH: Stage III chronic kidney disease, chronic systolic congestive heart failure with reduced ejection fraction, cardiomyopathy, history of status post CABG, nonsustained ventricular tachycardia, AICD, Smoker/ Smoking History: Former smoker.  Quit before year 2000, 1.75-pack-year smoking history Maintenance:   ? Pt of: Shearon Stalls  06/22/2020  - Visit   59 year old male former smoker initially referred to our office in May/2021.  Patient is followed by Dr. Shearon Stalls.  Patient was initially referred for evaluation of dyspnea.  Plan of care from the May/2021 consult was that we suspected patient's dyspnea was most likely secondary to progressive heart failure and deconditioning.  We ordered pulmonary function testing.  Felt that cough was more likely chronic and related to GERD and was trialed on PPI for 8 weeks.  Patient also with a small pulmonary nodule that needs follow-up in March/2022.  He was encouraged to remain on CPAP for management of sleep apnea  Our office is contacted [employer] for patient's records of CPAP therapy.  We do not have this compliance report on hand.  Patient presenting to office today to review pulmonary function testing.  Patient is also reporting has been having dizzy spells.  We will further evaluate and discuss this today.  Patient also reporting he is having some occasional dizziness that started about 2 weeks ago.  This happens often when he is having positional changes.  Patient continues to weigh himself daily.  He has missed some afternoon diuretic doses.  He estimates this is about 3-4 doses.  His weight is up around 10 pounds  over the last month.  Patient is established with Dr. [name] with cardiology but he is also managed by [facility] congest[facility] heart failure clinic.  See the information listed below:  Managed by [facility] - CHF may/2021 consult   IMPRESSION: Mr. Erney is a 59 yo male with hx of advanced NICM s/p MDT ICD, prior CABG (for iatrogenic LM dissection during diagnostic cath in 2007), CKD stage III, OSA on BIPAP, atrial fibrillation on eliquis, and type 2 diabetes mellitus who presents today as a new patient for and evaluation and management of his cardiomyopathy as well as consideration for advanced therapies.   Mr. Pate presents today with class III HF symptoms. He has had a known cardiomyopathy for some years (seemingly since his LM dissection), but has been essentially asymptomatic until about 2 years ago. He has since had progressive fatigue and DOE. I think his cough is most likely related to pulmonary edema, and does appear to have some volume on exam today. It was noted there have been issues tolerating GDMT previously, but today he has a reasonable blood pressure on coreg/aldatcone. Mr. Floren has a primary prevention ICD and has never been shocked by his device. He was recently diagnosed with atrial fibrillation, which appears to be a manifestation of his progressive cardiomyopathy.   From an advanced therapies standpoint, it seems Mr. Mehra may be a bit early, but I do wonder if his prior excellent physical health may be masking some of his symptoms. He had a CPX performed at [facility] a few weeks ago, and the  results of this will be quite informative. I think we have plenty of room on his GDMT, so will plan to start entresto today (and continue titrating at f/u). We discussed both transplant and VAD, and I suspect one will be required in the next year or two. If his VO2 max is indeed quite low, then we would plan on initiating an outpatient transplant evaluation in conjunction with GDMT titration. His main  barriers at this point would be his marijuana use and potentially his renal function. He is willing to stop using marijuana today, and we recommended this in the event transplant will be need. There was also a suggestion of significant pulmonary hypertension on his outside echo, and a RHC will be helpful in sorting this out.   PLAN:  - Start entresto 24/26 mg BID  - Increase aldactone to 25 mg qd - Cont coreg - SGLT2i at next visit  - Cont torsemide 40 mg BID  - Cont eliquis  - Check labs  - Get CPX records. If abnormal will plan on RHC +/- outpatient evaluation  - Would also plan to get last [facility] records. If report/films unavailable, CTA would be a good option (no ischemic symptoms)   Christopher A. Clayton Bibles, MD  Fellow, Division of Cardiology Pager [phone]  Patient is unsure when his next follow-up is with [facility] cardiology.  He believes it should be later on this month but he does not know when the schedule date is.  We will discuss and evaluate this today.  Questionaires / Pulmonary Flowsheets:   ACT:  No flowsheet data found.  MMRC: No flowsheet data found.  [facility]:  No flowsheet data found.  Tests:   06/14/2020-SARS-CoV-2-not detected   03/15/2020-pulmonary function test-FVC 3.01 (66% predicted), postbronchodilator ratio 83, postbronchodilator FEV1 2.55 (71% predicted), no bronchodilator response, TLC 5.84 (76% predicted), DLCO 24.67 (80% predicted)  02/04/2020-echocardiogram-LV ejection fraction 15 to 20%, increased pulmonary artery systolic pressure of 66.4, mild mitral regurgitation, severe tricuspid regurgitation  03/21/2020-CPET-exercise testing with gas exchange demonstrates mild functional impairment when compared to matched matched sedentary norms, patient appears with primary mild heart failure limitation with elevated VE/VCO2 slope, mild restrictive pulmonary component contributing to symptoms Agree with above. There is Mild functional limitation that is clearly  multifactorial related to HF, obesity with related restrictive lung physiology and mild chronotropic incompetence. The markedly elevated VE/VCO2 slope suggests a significant HF component.   Glori Bickers, MD  FENO:  No results found for: NITRICOXIDE  PFT: PFT Results Latest Ref Rng & Units 03/15/2020  FVC-Pre L 3.01  FVC-Predicted Pre % 66  FVC-Post L 3.07  FVC-Predicted Post % 67  Pre FEV1/FVC % % 83  Post FEV1/FCV % % 83  FEV1-Pre L 2.48  FEV1-Predicted Pre % 69  FEV1-Post L 2.55  DLCO uncorrected ml/min/mmHg 24.67  DLCO UNC% % 80  DLCO corrected ml/min/mmHg 29.20  DLCO COR %Predicted % 94  DLVA Predicted % 122  TLC L 5.84  TLC % Predicted % 76  RV % Predicted % 112    WALK:  No flowsheet data found.  Imaging: No results found.  Lab Results:  CBC    Component Value Date/Time   WBC 4.3 05/24/2020 0402   RBC 3.52 (L) 05/24/2020 0402   HGB 13.0 05/24/2020 1421   HGB 16.0 09/21/2018 1512   HCT 41.3 05/24/2020 1421   HCT 48.0 09/21/2018 1512   PLT 96 (L) 05/24/2020 0402   PLT 249 09/21/2018 1512   MCV 91.5 05/24/2020 0402  MCV 82 09/21/2018 1512   MCH 28.7 05/24/2020 0402   MCHC 31.4 05/24/2020 0402   RDW 14.6 05/24/2020 0402   RDW 14.1 09/21/2018 1512   LYMPHSABS 2,195 02/11/2020 1641   MONOABS 0.6 11/25/2018 1640   EOSABS 99 02/11/2020 1641   BASOSABS 19 02/11/2020 1641    BMET    Component Value Date/Time   NA 137 05/24/2020 0541   NA 140 11/29/2019 1027   K 4.5 05/24/2020 0541   CL 104 05/24/2020 0541   CO2 26 05/24/2020 0541   GLUCOSE 100 (H) 05/24/2020 0541   BUN 19 05/24/2020 0541   BUN 18 11/29/2019 1027   CREATININE 1.68 (H) 05/24/2020 0541   CREATININE 1.60 (H) 02/11/2020 1641   CALCIUM 8.0 (L) 05/24/2020 0541   GFRNONAA 44 (L) 05/24/2020 0541   GFRAA 51 (L) 05/24/2020 0541    BNP    Component Value Date/Time   BNP 356.1 (H) 05/23/2020 2344   BNP 262.4 (H) 02/19/2016 1053    ProBNP    Component Value Date/Time   PROBNP  431.0 (H) 11/25/2018 1640    Specialty Problems      Pulmonary Problems   OSA (obstructive sleep apnea)   Dyspnea on exertion   Restrictive lung disease      No Known Allergies  Immunization History  Administered Date(s) Administered  . Influenza,inj,Quad PF,6+ Mos 11/25/2018  . Influenza-Unspecified 08/23/2019  . PFIZER SARS-COV-2 Vaccination 05/12/2020, 06/03/2020  . Pneumococcal Conjugate-13 11/25/2018    Past Medical History:  Diagnosis Date  . AICD (automatic cardioverter/defibrillator) present   . Anxiety   . Cardiomyopathy    Nonischemic. EF has been about 45%. S/P CABG.;  b.  Echo 4/14: EF 25%, global HK with inf and mid apical AK, restrictive physiology with E/e' > 15 (elevated LV filling pressure), trivial AI/MR, mild to mod LAE, mild RVE, mild reduced RVSF, mild RAE, mod TR, PASP 74 (severe pulmonary HTN)   . CHF (congestive heart failure) ([place])   . CHF exacerbation ([place]) 07/31/2016  . Colon polyps 2002  . Coronary artery disease   . Depression   . Dyslipidemia    hx (03/20/2017)  . GERD (gastroesophageal reflux disease)   . History of bleeding peptic ulcer   . History of echocardiogram    Echo 10/16:  EF 25-30%, poss non-compaction, diff HK with inf-lat and apical HK, restrictive physio, mild AI, severe LAE, mild RVE with mild reduced RVSF, PASP 42 mmHg  . Hyperplastic rectal polyp   . OSA on CPAP    Mild  . Pneumonia   . Tobacco abuse    Remote    Tobacco History: Social History   Tobacco Use  Smoking Status Former Smoker  . Packs/day: 0.25  . Years: 7.00  . Pack years: 1.75  Smokeless Tobacco Never Used  Tobacco Comment   03/20/2017 "quit in 1997   Counseling given: Yes Comment: 03/20/2017 "quit in 1997   Continue to not smoke  Outpatient Encounter Medications as of 06/22/2020  Medication Sig  . ACCU-CHEK GUIDE test strip   . Accu-Chek Softclix Lancets lancets   . apixaban (ELIQUIS) 5 MG TABS tablet Take 1 tablet (5 mg total) by mouth 2  (two) times daily.  Marland Kitchen aspirin EC 81 MG tablet Take 81 mg by mouth daily.  . blood glucose meter kit and supplies KIT Dispense based on patient and insurance preference. Use up to four times daily as directed.  . carvedilol (COREG) 12.5 MG tablet Take 1 tablet (  12.5 mg total) by mouth 2 (two) times daily.  . Multiple Vitamin (MULTIVITAMIN WITH MINERALS) TABS tablet Take 1 tablet by mouth daily.  . sacubitril-valsartan (ENTRESTO) 24-26 MG Take 1 tablet by mouth in the morning and at bedtime.   . simvastatin (ZOCOR) 40 MG tablet Take 40 mg by mouth daily.  Marland Kitchen spironolactone (ALDACTONE) 25 MG tablet Take 25 mg by mouth daily.   Marland Kitchen torsemide (DEMADEX) 20 MG tablet Take 2 tablets (40 mg total) by mouth daily.   No facility-administered encounter medications on file as of 06/22/2020.     Review of Systems  Review of Systems  Constitutional: Positive for fatigue. Negative for activity change, chills, fever and unexpected weight change.  HENT: Negative for postnasal drip, rhinorrhea, sinus pressure, sinus pain and sore throat.   Eyes: Negative.   Respiratory: Positive for shortness of breath. Negative for cough and wheezing.   Cardiovascular: Positive for leg swelling. Negative for chest pain and palpitations.  Gastrointestinal: Negative for constipation, diarrhea, nausea and vomiting.  Endocrine: Negative.   Genitourinary: Negative.   Musculoskeletal: Negative.   Skin: Negative.   Neurological: Positive for dizziness. Negative for headaches.  Psychiatric/Behavioral: Negative.  Negative for dysphoric mood. The patient is not nervous/anxious.   All other systems reviewed and are negative.    Physical Exam  BP 126/76 (BP Location: Left Arm, Patient Position: Sitting, Cuff Size: Normal)   Pulse 86   Temp 98.3 F (36.8 C) (Oral)   Ht 6' (1.829 m)   Wt 236 lb 9.6 oz (107.3 kg)   SpO2 97%   BMI 32.09 kg/m   Wt Readings from Last 5 Encounters:  06/22/20 236 lb 9.6 oz (107.3 kg)  05/23/20  226 lb (102.5 kg)  04/27/20 233 lb (105.7 kg)  03/28/20 231 lb (104.8 kg)  03/15/20 230 lb 6.4 oz (104.5 kg)   Discussed weight increase   BMI Readings from Last 5 Encounters:  06/22/20 32.09 kg/m  05/23/20 29.82 kg/m  04/27/20 31.60 kg/m  03/28/20 31.33 kg/m  03/15/20 31.25 kg/m     Physical Exam Vitals and nursing note reviewed.  Constitutional:      General: He is not in acute distress.    Appearance: Normal appearance. He is obese.  HENT:     Head: Normocephalic and atraumatic.     Right Ear: Hearing and external ear normal.     Left Ear: Hearing and external ear normal.     Nose: Nose normal. No mucosal edema, congestion or rhinorrhea.     Right Turbinates: Not enlarged.     Left Turbinates: Not enlarged.     Mouth/Throat:     Mouth: Mucous membranes are dry.     Pharynx: Oropharynx is clear. No oropharyngeal exudate.  Eyes:     Pupils: Pupils are equal, round, and reactive to light.  Cardiovascular:     Rate and Rhythm: Normal rate and regular rhythm.     Pulses: Normal pulses.     Heart sounds: Normal heart sounds. No murmur heard.   Pulmonary:     Effort: Pulmonary effort is normal.     Breath sounds: Normal breath sounds. No decreased breath sounds, wheezing or rales.  Musculoskeletal:     Cervical back: Normal range of motion.     Right lower leg: Edema (1+) present.     Left lower leg: Edema (1+) present.  Lymphadenopathy:     Cervical: No cervical adenopathy.  Skin:    General: Skin is warm and dry.  Capillary Refill: Capillary refill takes less than 2 seconds.     Findings: No erythema or rash.  Neurological:     General: No focal deficit present.     Mental Status: He is alert and oriented to person, place, and time.     Motor: No weakness.     Coordination: Coordination normal.     Gait: Gait is intact. Gait normal.  Psychiatric:        Mood and Affect: Mood normal.        Behavior: Behavior normal. Behavior is cooperative.         Thought Content: Thought content normal.        Judgment: Judgment normal.       Assessment & Plan:   Dyspnea on exertion Patient continues to have persistent complaint of dyspnea  Dyspnea is almost certainly to be multifactorial.  Patient does have aspect of restrictive lung disease.  I believe unfortunately right now largest components are cardiovascular in nature given his congestive heart failure with reduced ejection fraction.  Weights are also up 10 pounds over the last month.  Plan: Patient to contact [facility] congest[facility] heart failure clinic today to ensure he has upcoming appointment to notify them that his weights are up 10 pounds Continue diuretics We will route note to Dr. [name] as well as Juluis Rainier 3-54-monthfollow-up with our office today  OSA (obstructive sleep apnea) No CPAP compliant report on file today We have contacted [employer] to get the patient added to ABerkshirePatient reports daily compliance with no issues to CPAP therapy at this time  Plan: Recommend to continue to use CPAP every day  Restrictive lung disease Restrictive component on pulmonary function testing  Plan: We will continue to clinically monitor Encourage patient to increase overall physical activity and work to reduce weight  Chronic systolic CHF (congestive heart failure) ([place] Plan: Schedule appointment with [facility] clinic  Cardiomyopathy, dilated, nonischemic ([facility] Plan: Schedule appointment with [facility] clinic    Return in about 6 months (around 12/23/2020), or if symptoms worsen or fail to improve, for Follow up with Dr. DShearon Stalls   BLauraine Rinne NP 06/22/2020   This appointment required 32 minutes of patient care (this includes precharting, chart review, review of results, face-to-face care, etc.).

## 2020-06-22 NOTE — Patient Instructions (Addendum)
You were seen today by Lauraine Rinne, NP  for:   1. Dyspnea on exertion 2. Restrictive lung disease  You do have some difficulty taking a full deep breath in.  I believe that your shortness of breath or your dyspnea that you are dealing with is directly related to your chronic systolic congestive heart failure  I believe you are fluid overloaded on exam today.  I would recommend you contact [facility] heart failure team today to notify them that your weight is up around 10 pounds and that you need to have follow-up with them  3. Chronic systolic CHF (congestive heart failure) (HCC) 4. Cardiomyopathy, dilated, nonischemic (HCC)  Continue your diuretics  Continue to weigh yourself daily  Please contact [facility] heart failure clinic to ensure you have follow-up and to notify them that your weight is up 10 pounds over the last month  5. OSA (obstructive sleep apnea)  We recommend that you continue using your CPAP daily >>>Keep up the hard work using your device >>> Goal should be wearing this for the entire night that you are sleeping, at least 4 to 6 hours  Remember:  . Do not drive or operate heavy machinery if tired or drowsy.  . Please notify the supply company and office if you are unable to use your device regularly due to missing supplies or machine being broken.  . Work on maintaining a healthy weight and following your recommended nutrition plan  . Maintain proper daily exercise and movement  . Maintaining proper use of your device can also help improve management of other chronic illnesses such as: Blood pressure, blood sugars, and weight management.   BiPAP/ CPAP Cleaning:  >>>Clean weekly, with Dawn soap, and bottle brush.  Set up to air dry. >>> Wipe mask out daily with wet wipe or towelette   Follow Up:    Return in about 6 months (around 12/23/2020), or if symptoms worsen or fail to improve, for Follow up with Dr. Shearon Stalls.   Please do your part to reduce the spread of  COVID-19:      Reduce your risk of any infection  and COVID19 by using the similar precautions used for avoiding the common cold or flu:  Marland Kitchen Wash your hands often with soap and warm water for at least 20 seconds.  If soap and water are not readily available, use an alcohol-based hand sanitizer with at least 60% alcohol.  . If coughing or sneezing, cover your mouth and nose by coughing or sneezing into the elbow areas of your shirt or coat, into a tissue or into your sleeve (not your hands). Langley Gauss A MASK when in public  . Avoid shaking hands with others and consider head nods or verbal greetings only. . Avoid touching your eyes, nose, or mouth with unwashed hands.  . Avoid close contact with people who are sick. . Avoid places or events with large numbers of people in one location, like concerts or sporting events. . If you have some symptoms but not all symptoms, continue to monitor at home and seek medical attention if your symptoms worsen. . If you are having a medical emergency, call 911.   [place] / e-Visit: [email or web address]         [facility]: [facility]: [phone]                   [facility]: [phone]     It is flu season:   >>> Best ways to protect herself from the flu: Receive the yearly flu vaccine, practice good hand hygiene washing with soap and also using hand sanitizer when available, eat a nutritious meals, get adequate rest, hydrate appropriately   Please contact the office if your symptoms worsen or you have concerns that you are not improving.   Thank you for choosing [facility] for your healthcare, and for allowing [place] to partner with you on your healthcare journey. I am thankful to be able to provide care to you today.   Wyn Quaker FNP-C

## 2020-06-29 DIAGNOSIS — I5022 Chronic systolic (congestive) heart failure: Secondary | ICD-10-CM | POA: Diagnosis not present

## 2020-07-21 NOTE — Progress Notes (Signed)
Cardiology Office Note:    Date:  07/24/2020   ID:  Bruce Robertson, DOB 1961-05-24, MRN [id number]  PCP:  Bruce Macadam, MD  Cardiologist:  Bruce Breeding, MD   Referring MD: Bruce Macadam, MD   Chief Complaint  Patient presents with  . Follow-up    CHF    History of Present Illness:    Bruce Robertson is a 59 y.o. 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 He had a diagnostic heart cath in 7048 complicated by left main dissection requiring CABG. EF at that time dropped to 20-25%. He follows with Bruce Robertson and found to have Afib on ICD. Echo with EF 15-20%, severe TR, and moderate RV dysfunction. He was referred to [facility] heart failure team. OSA on BiPAP. Social issues and compliance have been issues in the past. He was hospitalized 05/2020 for BRBPR. He was discharged continued on ASA and eliquis. He saw [facility] who concluded hemorrhoids.   Was last seen by Bruce Robertson heart failure team on 06/29/20, who are titrating medications. He is now on low dose entrestro, 5 mg jardiance, BB, spiro, and continued 40 mg torsemide.  He reports recent trouble with vomiting. He has been unable to eat without vomiting for the last 1 week. He was able to keep dinner last night and breakfast this morning. He has had now energy. He feels better today. He also reports coughing up black phlegm for the last week. Bruce Robertson started him on omeprazole.  He reports dizziness upon standing, which has been his normal.     Past Medical History:  Diagnosis Date  . AICD (automatic cardioverter/defibrillator) present   . Anxiety   . Cardiomyopathy    Nonischemic. EF has been about 45%. S/P CABG.;  b.  Echo 4/14: EF 25%, global HK with inf and mid apical AK, restrictive physiology with E/e' > 15 (elevated LV filling pressure), trivial AI/MR, mild to mod LAE, mild RVE, mild reduced RVSF, mild RAE,  mod TR, PASP 74 (severe pulmonary HTN)   . CHF (congestive heart failure) ([place])   . CHF exacerbation ([place]) 07/31/2016  . Colon polyps 2002  . Coronary artery disease   . Depression   . Dyslipidemia    hx (03/20/2017)  . GERD (gastroesophageal reflux disease)   . History of bleeding peptic ulcer   . History of echocardiogram    Echo 10/16:  EF 25-30%, poss non-compaction, diff HK with inf-lat and apical HK, restrictive physio, mild AI, severe LAE, mild RVE with mild reduced RVSF, PASP 42 mmHg  . Hyperplastic rectal polyp   . OSA on CPAP    Mild  . Pneumonia   . Tobacco abuse    Remote    Past Surgical History:  Procedure Laterality Date  . CARDIAC CATHETERIZATION  2007;  2009  . CARDIAC DEFIBRILLATOR PLACEMENT  03/20/2017  . COLONOSCOPY WITH PROPOFOL N/A 01/21/2019   Procedure: COLONOSCOPY WITH PROPOFOL;  Surgeon: Milus Banister, MD;  Location: [facility];  Service: Endoscopy;  Laterality: N/A;  . CORONARY ARTERY BYPASS GRAFT  2007   Had a left main dissection after catheterization. Underwent a saphenous vein graft to the LAD and a saphenous vein graft to obtuse marginal.  . ICD IMPLANT N/A 03/20/2017   Procedure: ICD Implant;  Surgeon: Constance Haw, MD;  Location: [place];  Service: Cardiovascular;  Laterality: N/A;  . PATELLAR TENDON REPAIR Right 2005  . POLYPECTOMY  01/21/2019   Procedure: POLYPECTOMY;  Surgeon: Milus Banister, MD;  Location: [facility];  Service: Endoscopy;;    Current Medications: Current Meds  Medication Sig  . ACCU-CHEK GUIDE test strip   . Accu-Chek Softclix Lancets lancets   . apixaban (ELIQUIS) 5 MG TABS tablet Take 1 tablet (5 mg total) by mouth 2 (two) times daily.  Marland Kitchen aspirin EC 81 MG tablet Take 81 mg by mouth daily.  . blood glucose meter kit and supplies KIT Dispense based on patient and insurance preference. Use up to four times daily as directed.  . carvedilol (COREG) 12.5 MG tablet Take 1 tablet (12.5 mg total) by  mouth 2 (two) times daily.  Marland Kitchen JARDIANCE 10 MG TABS tablet Take 1 tablet by mouth daily.  . Multiple Vitamin (MULTIVITAMIN WITH MINERALS) TABS tablet Take 1 tablet by mouth daily.  Marland Kitchen omeprazole (PRILOSEC) 20 MG capsule Take 20 mg by mouth daily.  . sacubitril-valsartan (ENTRESTO) 24-26 MG Take 1 tablet by mouth in the morning and at bedtime.   . simvastatin (ZOCOR) 40 MG tablet Take 40 mg by mouth daily.  Marland Kitchen spironolactone (ALDACTONE) 25 MG tablet Take 25 mg by mouth daily.   Marland Kitchen torsemide (DEMADEX) 20 MG tablet Take 2 tablets (40 mg total) by mouth daily.     Allergies:   Patient has no known allergies.   Social History   Socioeconomic History  . Marital status: Divorced    Spouse name: Not on file  . Number of children: 5  . Years of education: Not on file  . Highest education level: Not on file  Occupational History  . Occupation: Civil engineer, contracting    Employer: [employer]  Tobacco Use  . Smoking status: Former Smoker    Packs/day: 0.25    Years: 7.00    Pack years: 1.75  . Smokeless tobacco: Never Used  . Tobacco comment: 03/20/2017 "quit in 1997  Vaping Use  . Vaping Use: Former  . Devices: vaped for about a month  Substance and Sexual Activity  . Alcohol use: Yes    Alcohol/week: 0.0 standard drinks    Comment: twice a month no preference  . Drug use: Yes    Types: Marijuana    Comment: 03/20/2017 "joint qod"  . Sexual activity: Not Currently  Other Topics Concern  . Not on file  Social History Narrative   Single   Social Determinants of Health   Financial Resource Strain:   . Difficulty of Paying Living Expenses: Not on file  Food Insecurity:   . Worried About Charity fundraiser in the Last Year: Not on file  . Ran Out of Food in the Last Year: Not on file  Transportation Needs: No Transportation Needs  . Lack of Transportation (Medical): No  . Lack of Transportation (Non-Medical): No  Physical Activity: Sufficiently Active  . Days of Exercise per Week: 4  days  . Minutes of Exercise per Session: 40 min  Stress:   . Feeling of Stress : Not on file  Social Connections:   . Frequency of Communication with Friends and Family: Not on file  . Frequency of Social Gatherings with Friends and Family: Not on file  . Attends Religious Services: Not on file  . Active Member of Clubs or Organizations: Not on file  . Attends Archivist Meetings: Not on file  . Marital Status: Not on file  Family History: The patient's family history includes Bipolar disorder in his daughter; Cancer in his maternal grandmother and maternal uncle; Diabetes in his daughter and niece; Heart failure (age of onset: 66) in his father; High blood pressure in his father; Hypertension in his mother; Lung cancer in an other family member; Osteoarthritis in his mother. There is no history of Asthma.  ROS:   Please see the history of present illness.     All other systems reviewed and are negative.  EKGs/Labs/Other Studies Reviewed:    The following studies were reviewed today:  Echo 02/04/20: 1. Left ventricular ejection fraction, by estimation, is 15-20%. The left  ventricle has severely decreased function. The left ventricle demonstrates  global hypokinesis. The left ventricular internal cavity size was severely  dilated. Left ventricular  diastolic parameters are indeterminate. Elevated left ventricular  end-diastolic pressure.  2. Right ventricular systolic function is moderately reduced. The right  ventricular size is moderately enlarged. There is severely elevated  pulmonary artery systolic pressure. The estimated right ventricular  systolic pressure is 49.7 mmHg.  3. Left atrial size was moderately dilated.  4. Right atrial size was moderate-severely dilated.  5. The mitral valve is degenerative. Mild mitral valve regurgitation. No  evidence of mitral stenosis.  6. Tricuspid valve regurgitation is severe.  7. The aortic valve is abnormal. Aortic  valve regurgitation is mild. No  aortic stenosis is present.  8. The inferior vena cava is dilated in size with <50% respiratory  variability, suggesting right atrial pressure of 15 mmHg.   EKG:  EKG is not ordered today.   Recent Labs: 05/23/2020: ALT 21; B Natriuretic Peptide 356.1 05/24/2020: BUN 19; Creatinine, Ser 1.68; Hemoglobin 13.0; Platelets 96; Potassium 4.5; Sodium 137  Recent Lipid Panel    Component Value Date/Time   CHOL 161 02/11/2020 1641   TRIG 135 02/11/2020 1641   HDL 46 02/11/2020 1641   CHOLHDL 3.5 02/11/2020 1641   VLDL 37.2 11/25/2018 1640   LDLCALC 92 02/11/2020 1641   LDLDIRECT 166.4 05/17/2010 0905    Physical Exam:    VS:  BP (!) 98/52   Pulse (!) 53   Ht 6' (1.829 m)   Wt 230 lb (104.3 kg)   SpO2 98%   BMI 31.19 kg/m     Wt Readings from Last 3 Encounters:  07/24/20 230 lb (104.3 kg)  06/22/20 236 lb 9.6 oz (107.3 kg)  05/23/20 226 lb (102.5 kg)     GEN: Well nourished, well developed in no acute distress HEENT: Normal NECK: No JVD; No carotid bruits LYMPHATICS: No lymphadenopathy CARDIAC: RRR, no murmurs, rubs, gallops RESPIRATORY:  Clear to auscultation without rales, wheezing or rhonchi  ABDOMEN: tender to palpation in epigastric area MUSCULOSKELETAL:  No edema; No deformity  SKIN: Warm and dry NEUROLOGIC:  Alert and oriented x 3 PSYCHIATRIC:  Normal affect   ASSESSMENT:    1. Medication management   2. Non-ischemic cardiomyopathy ([place])   3. ICD (implantable cardioverter-defibrillator) battery depletion   4. Chronic systolic CHF (congestive heart failure) ([place])   5. Hx of CABG   6. Nausea and vomiting, intractability of vomiting not specified, unspecified vomiting type   7. Productive cough   8. Stage 3a chronic kidney disease   9. Mixed hyperlipidemia    PLAN:    In order of problems listed above:  Biventricular heart failure NICM ICD in place - EF 15-20%, moderate RV dysfunction, severe TR - he has been referred to  [facility] AHF clinic -  started on entresto which has improved his exercise tolerance, continue BB, spiro, torsemide, and jardiance   Vomiting Productive cough of black phlegm - will check CBC and BMP today - did not vomit last night or this morning - reports that when he eats, phlegm forms in the back of his throat and he coughs it up - appears black - he is also tender to palpation in the epigastric region - I am concerned about bleeding - will check a CBC and BMP today - I advised toast, rice and chicken for now and will have him see his PCP this week - he has seen [facility] in the past, may need to see again depending on his CBC results -  I have asked him to hold ASA for now   Paroxysmal atrial fibrillation - detected on ICD - This patients CHA2DS2-VASc Score and unadjusted Ischemic Stroke Rate (% per year) is equal to 3.2 % stroke rate/year from a score of 3 (CHF, HTN, DM)   Left main dissection s/p CABG - no angina - will stop ASA for now to make sure he isn't bleeding   DM - A1c 6.8%   Hyperlipidemia - continue 40 mg simvastatin 02/11/2020: Cholesterol 161; HDL 46; LDL Cholesterol (Calc) 92; Triglycerides 135     Medication Adjustments/Labs and Tests Ordered: Current medicines are reviewed at length with the patient today.  Concerns regarding medicines are outlined above.  Orders Placed This Encounter  Procedures  . CBC  . Basic Metabolic Panel (BMET)   No orders of the defined types were placed in this encounter.   Signed, Ledora Bottcher, PA  07/24/2020 11:05 AM    [place]

## 2020-07-24 ENCOUNTER — Other Ambulatory Visit: Payer: Self-pay

## 2020-07-24 ENCOUNTER — Encounter: Payer: Self-pay | Admitting: Physician Assistant

## 2020-07-24 ENCOUNTER — Ambulatory Visit (INDEPENDENT_AMBULATORY_CARE_PROVIDER_SITE_OTHER): Payer: Medicare HMO | Admitting: Physician Assistant

## 2020-07-24 VITALS — BP 98/52 | HR 53 | Ht 72.0 in | Wt 230.0 lb

## 2020-07-24 DIAGNOSIS — I5022 Chronic systolic (congestive) heart failure: Secondary | ICD-10-CM

## 2020-07-24 DIAGNOSIS — Z79899 Other long term (current) drug therapy: Secondary | ICD-10-CM | POA: Diagnosis not present

## 2020-07-24 DIAGNOSIS — R05 Cough: Secondary | ICD-10-CM

## 2020-07-24 DIAGNOSIS — Z4502 Encounter for adjustment and management of automatic implantable cardiac defibrillator: Secondary | ICD-10-CM

## 2020-07-24 DIAGNOSIS — R058 Other specified cough: Secondary | ICD-10-CM

## 2020-07-24 DIAGNOSIS — I428 Other cardiomyopathies: Secondary | ICD-10-CM | POA: Diagnosis not present

## 2020-07-24 DIAGNOSIS — N1831 Chronic kidney disease, stage 3a: Secondary | ICD-10-CM

## 2020-07-24 DIAGNOSIS — E782 Mixed hyperlipidemia: Secondary | ICD-10-CM | POA: Diagnosis not present

## 2020-07-24 DIAGNOSIS — Z951 Presence of aortocoronary bypass graft: Secondary | ICD-10-CM | POA: Diagnosis not present

## 2020-07-24 DIAGNOSIS — R112 Nausea with vomiting, unspecified: Secondary | ICD-10-CM

## 2020-07-24 LAB — BASIC METABOLIC PANEL
BUN/Creatinine Ratio: 12 (ref 9–20)
BUN: 23 mg/dL (ref 6–24)
CO2: 27 mmol/L (ref 20–29)
Calcium: 9.6 mg/dL (ref 8.7–10.2)
Chloride: 97 mmol/L (ref 96–106)
Creatinine, Ser: 1.98 mg/dL — ABNORMAL HIGH (ref 0.76–1.27)
GFR calc Af Amer: 42 mL/min/{1.73_m2} — ABNORMAL LOW (ref >59)
GFR calc non Af Amer: 36 mL/min/{1.73_m2} — ABNORMAL LOW (ref >59)
Glucose: 77 mg/dL (ref 65–99)
Potassium: 4.3 mmol/L (ref 3.5–5.2)
Sodium: 135 mmol/L (ref 134–144)

## 2020-07-24 LAB — CBC
Hematocrit: 36.4 % — ABNORMAL LOW (ref 37.5–51.0)
Hemoglobin: 12.1 g/dL — ABNORMAL LOW (ref 13.0–17.7)
MCH: 28.3 pg (ref 26.6–33.0)
MCHC: 33.2 g/dL (ref 31.5–35.7)
MCV: 85 fL (ref 79–97)
Platelets: 150 10*3/uL (ref 150–450)
RBC: 4.27 x10E6/uL (ref 4.14–5.80)
RDW: 13.1 % (ref 11.6–15.4)
WBC: 4.5 10*3/uL (ref 3.4–10.8)

## 2020-07-24 NOTE — Patient Instructions (Signed)
Medication Instructions:  HOLD- Aspirin for now  *If you need a refill on your cardiac medications before your next appointment, please call your pharmacy*   Lab Work: CBC and BMP Today  If you have labs (blood work) drawn today and your tests are completely normal, you will receive your results only by: Marland Kitchen MyChart Message (if you have MyChart) OR . A paper copy in the mail If you have any lab test that is abnormal or we need to change your treatment, we will call you to review the results.   Testing/Procedures: None Ordered   Follow-Up: At [facility], you and your health needs are our priority.  As part of our continuing mission to provide you with exceptional heart care, we have created designated Provider Care Teams.  These Care Teams include your primary Cardiologist (physician) and Advanced Practice Providers (APPs -  Physician Assistants and Nurse Practitioners) who all work together to provide you with the care you need, when you need it.  We recommend signing up for the patient portal called "MyChart".  Sign up information is provided on this After Visit Summary.  MyChart is used to connect with patients for Virtual Visits (Telemedicine).  Patients are able to view lab/test results, encounter notes, upcoming appointments, etc.  Non-urgent messages can be sent to your provider as well.   To learn more about what you can do with MyChart, go to [email or web address].    Your next appointment:   3 month(s)  The format for your next appointment:   In Person  Provider:   You may see Minus Breeding, MD or one of the following Advanced Practice Providers on your designated Care Team:    Rosaria Ferries, PA-C  Jory Sims, DNP, ANP

## 2020-07-25 ENCOUNTER — Telehealth: Payer: Self-pay

## 2020-07-25 NOTE — Telephone Encounter (Signed)
Carelink alert received 07/25/20 for AF alert >6 hours, known PAF on eliquis. 21 NST appear AF w/ RVR 200bpm at times.  Per note from yesterday 07/24/20, seen by . [facility], PA, patient complained of vomiting black phlegm x1 week . Per reports he felt better yesterday. Blood work completed.  Med list includes, Eliquis 5 mg I BID, Coreg 12.5 mg BID, Entresto 24-26 mg BID, torsemide 20 mg daily.    Called patient to discuss any symptoms that could be related to device.   No answer, LMOVM.

## 2020-07-25 NOTE — Telephone Encounter (Signed)
Pt returning nurse call

## 2020-07-25 NOTE — Telephone Encounter (Signed)
Spoke to patient in regards to previous note. Patient reports starting on 07/18/20 - 07/22/20, he experienced vomiting and fatigued. Patient states he was not able to keep anything down including medications. Patient states he feels back to normal. Advised patient I will forward to the AF Clinic to keep them in the loop. Advised he may not receive phone call if no changes are necessary. Patient advised to be sure to follow-up with A. Duke as planned. Patient verbalizes understanding and agreeable to plan.

## 2020-07-27 ENCOUNTER — Other Ambulatory Visit: Payer: Self-pay | Admitting: *Deleted

## 2020-07-27 DIAGNOSIS — Z79899 Other long term (current) drug therapy: Secondary | ICD-10-CM

## 2020-07-27 DIAGNOSIS — I5022 Chronic systolic (congestive) heart failure: Secondary | ICD-10-CM

## 2020-08-09 ENCOUNTER — Telehealth: Payer: Self-pay

## 2020-08-09 NOTE — Progress Notes (Signed)
Spoke to patient to confirmed patient telephone appointment on 08/10/2020 for CCM at 4:30 PM with Junius Argyle the Clinical pharmacist.   Patient verbalized understanding.  [place] Pharmacist Assistant [phone]

## 2020-08-10 ENCOUNTER — Telehealth: Payer: Medicare HMO

## 2020-08-10 NOTE — Chronic Care Management (AMB) (Deleted)
Chronic Care Management Pharmacy  Name: Bruce Robertson  MRN: [id number] DOB: [birth year]  Chief Complaint/ HPI  Bruce Robertson,  59 y.o. , male presents for their Follow-Up CCM visit with the clinical pharmacist via telephone.   Patient is on permanent disability. Patient has not been doing any physical activity due to declining health. Patient has started going back to the gym a week ago and tries to do both cardio and weight lifting at least 3-4 days a week for 45 mins. Patient is living with fiance and has been engaged for about year now. He says he eats a lot of fish and salad and eats chicken and steak from time to time. Does not go out to eat and no fast foods. He uses DASH as salt alternative for seasoning. His main concern today is his overall heart health and how can he prevent further damage.  PCP : Caren Macadam, MD  Their chronic conditions include: HTN, HLD, cardiomyopathy, stage 3 CKD, systolic heart failure, pre-diabetes, paroxysmal Afib  Office Visits: 02/11/20 OV - cough with black sputum. Ordered blood work and CXR. HTN well controlled. HLD has been well controlled. Cardiomyopathy evaluating and followed by cardio.   Consult Visit:  07/24/20: Patient presented to Fabian Sharp, [place] (Cardiology) for follow-up. Patient with vomiting. No medication changes made. Aspirin temporarily stopped.  06/29/20: Patient presented to Dr. Sharalyn Ink (Cardiology) for follow-up. Patient started on Jardiance 10 mg daily.  06/22/20: Patient presented to Wyn Quaker, NP (Pulmonology) for follow-up. Patient with postural dizziness. Weight is up ~10 pounds. 7/20-7/21: Patient hospitalized for acute blood loss anemia.  05/18/20:    Medications: Outpatient Encounter Medications as of 08/10/2020  Medication Sig  . ACCU-CHEK GUIDE test strip   . Accu-Chek Softclix Lancets lancets   . apixaban (ELIQUIS) 5 MG TABS tablet Take 1 tablet (5 mg total) by mouth 2 (two) times daily.  . blood glucose meter  kit and supplies KIT Dispense based on patient and insurance preference. Use up to four times daily as directed.  . carvedilol (COREG) 12.5 MG tablet Take 1 tablet (12.5 mg total) by mouth 2 (two) times daily.  Marland Kitchen JARDIANCE 10 MG TABS tablet Take 10 mg by mouth daily.   . Multiple Vitamin (MULTIVITAMIN WITH MINERALS) TABS tablet Take 1 tablet by mouth daily.  Marland Kitchen omeprazole (PRILOSEC) 40 MG capsule Take 40 mg by mouth daily before breakfast.  . sacubitril-valsartan (ENTRESTO) 24-26 MG Take 1 tablet by mouth in the morning and at bedtime.   . simvastatin (ZOCOR) 40 MG tablet Take 40 mg by mouth at bedtime.   Marland Kitchen spironolactone (ALDACTONE) 25 MG tablet Take 25 mg by mouth daily.   Marland Kitchen torsemide (DEMADEX) 20 MG tablet Take 2 tablets (40 mg total) by mouth daily.   No facility-administered encounter medications on file as of 08/10/2020.     Transportation Needs: No Transportation Needs  . Lack of Transportation (Medical): No  . Lack of Transportation (Non-Medical): No     Physical Activity: Sufficiently Active  . Days of Exercise per Week: 4 days  . Minutes of Exercise per Session: 40 min    Current Diagnosis/Assessment:  Goals Addressed   None     AFIB   Managed by Dr. [name]  Patient is currently rate controlled. HR 45-48 BPM  Patient has failed these meds in past: none Patient is currently controlled on the following medications:   Carvedilol 12.5 mg 1 tablet twice daily  Eliquis 5 mg 1 tablet twice daily  Patient reports heart rate being between 45-48 most of the time. He is well controlled on his medications. Explained to him the use of his medications and their importance. Counseled on severe bleeding/brusing as side effects for Eliquis.   Plan  Continue current medications and control with diet and exercise   Heart Failure   Managed by Dr. [name] [facility]) Also seen at [facility]  Type: Combined Systolic and Diastolic  Last ejection fraction:  15-20% 4/21 NYHA Class: IV (significant limitation of activity / dyspnea at rest) AHA HF Stage: D (Advanced disease requiring aggressive medical therapy)  Patient has failed these meds in past: lisinopril, losartan, isosorbide mononitrate, furosemide, carvedilol Patient is currently controlled on the following medications:   Carvedilol 12.5 mg 1 tablet twice daily  Entresto 24-26 mg 1 tablet AM and HS  Jardiance 10 mg daily   Spironolactone 25 mg 1 tablet daily  Torsemide 20 mg 2 tablets daily  HF controlled at 15-20% EF ECHO 4/21. Counseled patient to exercise as tolerated and continue DASH diet and limit salt intake. Educated patient on importance of each medication and to continue taking them as prescribed. Patient reports having shortness of breath even at rest.  Patient is now followed by [facility].  Plan  Continue control with diet and exercise   Hypertension   BP today is:  <130/80  Office blood pressures are  BP Readings from Last 3 Encounters:  07/24/20 (!) 98/52  06/22/20 126/76  05/24/20 101/76    Patient has failed these meds in the past:  lisinopril, losartan, isosorbide mononitrate, furosemide, carvedilol Patient is currently controlled on the following medications:  . Carvedilol 12.5 mg 1 tablet twice daily . Spironolactone 25 mg 1 tablet daily . Torsemide 20 mg 2 tablets daily  Patient checks BP at home twice daily  Patient home BP readings are ranging: 85/60  BP stable and well controlled. Advised the patient to monitor blood pressure routinely and if blood pressure is too low (lower than 80 systolic) to hydrate and be vigilant if he needs to call 911. We discussed diet and exercise extensively and how this helps his heart.   Plan  Continue current medications and control with diet and exercise      Hyperlipidemia   Lipid Panel     Component Value Date/Time   CHOL 161 02/11/2020 1641   TRIG 135 02/11/2020 1641   HDL 46 02/11/2020 1641   CHOLHDL  3.5 02/11/2020 1641   VLDL 37.2 11/25/2018 1640   LDLCALC 92 02/11/2020 1641   LDLDIRECT 166.4 05/17/2010 0905     The 10-year ASCVD risk score Mikey Bussing DC Jr., et al., 2013) is: 13%   Values used to calculate the score:     Age: 59 years     Sex: Male     Is Non-Hispanic African American: Yes     Diabetic: Yes     Tobacco smoker: No     Systolic Blood Pressure: 98 mmHg     Is BP treated: Yes     HDL Cholesterol: 46 mg/dL     Total Cholesterol: 161 mg/dL   Patient has failed these meds in past: atorvastatin Patient is currently controlled on the following medications:  . Simvastatin 20 mg 1 tablet daily  Cholesterol at goal. Patient reports eating healthier and does DASH diet. Patient just got back to the gym and trying to exercise at least 3-4 days/week for 45 mins. Reports eating fish and salad most of the time  and denies eating out.  Plan  Continue current medications and control with diet and exercise   Diabetes   Recent Relevant Labs: Lab Results  Component Value Date/Time   HGBA1C 6.6 (H) 02/11/2020 04:41 PM   HGBA1C 6.8 (H) 03/04/2019 10:10 AM   GFR 55.54 (L) 03/04/2019 10:10 AM   GFR 54.39 (L) 11/25/2018 04:40 PM   MICROALBUR 0.4 02/11/2020 04:41 PM   MICROALBUR 3.3 (H) 03/04/2019 10:10 AM    Checking BG: Rarely  Patient has failed these meds in past: None Patient is currently controlled on the following medications:  . Jardiance 10 mg daily   Last diabetic Eye exam: No results found for: HMDIABEYEEXA  Last diabetic Foot exam: No results found for: HMDIABFOOTEX   A1c is within goal. Encouraged patient to continue with lifestyle modifications.   Plan  Continue control with diet and exercise   GERD   Patient has failed these meds in past: dexlansoprazole, pantoprazole, sucralfate Patient is currently controlled on the following medications:  . Omeprazole 40 mg 1 capsule daily  Patient reports not taking omeprazole now since he has not experience any  reflux or heartburn even after quitting it. Counseled to take famotidine PRN when reflux episodes occur.  Plan  Recommended stop taking Omeprazole daily Recommend to start taking famotidine PRN   OTCs/Health Maintenance   Patient is currently controlled on the following medications: . AccuChek Guide test strips use up to 4 times daily . AccuChek Softclix lancets use up to 4 times daily . Aspirin EC 81 mg 1 tablet daily . Multivitamin 1 tablet daily  Plan  Continue current medications     Vaccines   Reviewed and discussed patient's vaccination history.    Immunization History  Administered Date(s) Administered  . Influenza,inj,Quad PF,6+ Mos 11/25/2018  . Influenza-Unspecified 08/23/2019  . PFIZER SARS-COV-2 Vaccination 05/12/2020, 06/03/2020  . Pneumococcal Conjugate-13 11/25/2018    Plan  Recommended patient receive pneumovax 23 and COVID vaccines at the pharmacy/office.    Medication Management   Pharmacy/Benefits: [facility] Adherence: Fiance helps with the medications Pt endorses 100% compliance  We discussed the benefits of [facility] pharmacy and medication synchronization and delivery, but tpatient just lives 5 minutes away from [place] and he mentions it being convenient for him currently.  Plan  Continue current medication management strategy   Follow up: 3 month phone visit   Geraldine Contras, PharmD Clinical Pharmacist [facility] [place] [phone]

## 2020-08-14 ENCOUNTER — Encounter: Payer: Self-pay | Admitting: Cardiology

## 2020-08-14 NOTE — Patient Instructions (Addendum)
DUE TO COVID-19 ONLY ONE VISITOR IS ALLOWED TO COME WITH YOU AND STAY IN THE WAITING ROOM ONLY DURING PRE OP AND PROCEDURE DAY OF SURGERY. THE 1 VISITOR  MAY VISIT WITH YOU AFTER SURGERY IN YOUR PRIVATE ROOM DURING VISITING HOURS ONLY!  YOU NEED TO HAVE A COVID 19 TEST ON: 08/19/20 @  10:00 AM , THIS TEST MUST BE DONE BEFORE SURGERY,  COVID TESTING SITE [place] [address], IT IS ON THE RIGHT GOING OUT [address] APPROXIMATELY  2 MINUTES PAST [employer] ON THE RIGHT. ONCE YOUR COVID TEST IS COMPLETED,  PLEASE BEGIN THE QUARANTINE INSTRUCTIONS AS OUTLINED IN YOUR HANDOUT.                Bruce Robertson   Your procedure is scheduled on: 08/23/20   Report to [facility] Main  Entrance   Report to admitting at: 1:00 PM     Call this number if you have problems the morning of surgery [phone]    Remember:   NO SOLID FOOD AFTER MIDNIGHT THE NIGHT PRIOR TO SURGERY. NOTHING BY MOUTH EXCEPT CLEAR LIQUIDS UNTIL: 12:00 PM . PLEASE FINISH GATORADE DRINK PER SURGEON ORDER  WHICH NEEDS TO BE COMPLETED AT : 12:00 PM.  CLEAR LIQUID DIET   Foods Allowed                                                                     Foods Excluded  Coffee and tea, regular and decaf                             liquids that you cannot  Plain Jell-O any favor except red or purple                                           see through such as: Fruit ices (not with fruit pulp)                                     milk, soups, orange juice  Iced Popsicles                                    All solid food Carbonated beverages, regular and diet                                    Cranberry, grape and apple juices Sports drinks like Gatorade Lightly seasoned clear broth or consume(fat free) Sugar, honey syrup  Sample Menu Breakfast                                Lunch  Supper Cranberry juice                    Beef broth                             Chicken broth Jell-O                                     Grape juice                           Apple juice Coffee or tea                        Jell-O                                      Popsicle                                                Coffee or tea                        Coffee or tea  _____________________________________________________________________   BRUSH YOUR TEETH MORNING OF SURGERY AND RINSE YOUR MOUTH OUT, NO CHEWING GUM CANDY OR MINTS.     Take these medicines the morning of surgery with A SIP OF WATER: omeprazole,carvedilol.  How to Manage Your Diabetes Before and After Surgery  Why is it important to control my blood sugar before and after surgery? . Improving blood sugar levels before and after surgery helps healing and can limit problems. . A way of improving blood sugar control is eating a healthy diet by: o  Eating less sugar and carbohydrates o  Increasing activity/exercise o  Talking with your doctor about reaching your blood sugar goals . High blood sugars (greater than 180 mg/dL) can raise your risk of infections and slow your recovery, so you will need to focus on controlling your diabetes during the weeks before surgery. . Make sure that the doctor who takes care of your diabetes knows about your planned surgery including the date and location.  How do I manage my blood sugar before surgery? . Check your blood sugar at least 4 times a day, starting 2 days before surgery, to make sure that the level is not too high or low. o Check your blood sugar the morning of your surgery when you wake up and every 2 hours until you get to the Short Stay unit. . If your blood sugar is less than 70 mg/dL, you will need to treat for low blood sugar: o Do not take insulin. o Treat a low blood sugar (less than 70 mg/dL) with  cup of clear juice (cranberry or apple), 4 glucose tablets, OR glucose gel. o Recheck blood sugar in 15 minutes after treatment (to  make sure it is greater than 70 mg/dL). If your blood sugar is not greater than 70 mg/dL on recheck, call [phone] for further instructions. . Report your blood sugar to the short stay nurse when you get to Short Stay.  . If you  are admitted to the hospital after surgery: o Your blood sugar will be checked by the staff and you will probably be given insulin after surgery (instead of oral diabetes medicines) to make sure you have good blood sugar levels. o The goal for blood sugar control after surgery is 80-180 mg/dL  DO NOT TAKE JARDIANCE THE DAY BEFORE SURGERY. DO NOT TAKE ANY DIABETIC MEDICATIONS DAY OF YOUR SURGERY                               You may not have any metal on your body including hair pins and              piercings  Do not wear jewelry, lotions, powders or perfumes, deodorant             Men may shave face and neck.   Do not bring valuables to the hospital. [place].  Contacts, dentures or bridgework may not be worn into surgery.  Leave suitcase in the car. After surgery it may be brought to your room.     Patients discharged the day of surgery will not be allowed to drive home. IF YOU ARE HAVING SURGERY AND GOING HOME THE SAME DAY, YOU MUST HAVE AN ADULT TO DRIVE YOU HOME AND BE WITH YOU FOR 24 HOURS. YOU MAY GO HOME BY TAXI OR UBER OR ORTHERWISE, BUT AN ADULT MUST ACCOMPANY YOU HOME AND STAY WITH YOU FOR 24 HOURS.  Name and phone number of your driver:  Special Instructions: N/A              Please read over the following fact sheets you were given: _____________________________________________________________________        [facility] - Preparing for Surgery Before surgery, you can play an important role.  Because skin is not sterile, your skin needs to be as free of germs as possible.  You can reduce the number of germs on your skin by washing with CHG (chlorahexidine gluconate) soap before surgery.  CHG is an  antiseptic cleaner which kills germs and bonds with the skin to continue killing germs even after washing. Please DO NOT use if you have an allergy to CHG or antibacterial soaps.  If your skin becomes reddened/irritated stop using the CHG and inform your nurse when you arrive at Short Stay. Do not shave (including legs and underarms) for at least 48 hours prior to the first CHG shower.  You may shave your face/neck. Please follow these instructions carefully:  1.  Shower with CHG Soap the night before surgery and the  morning of Surgery.  2.  If you choose to wash your hair, wash your hair first as usual with your  normal  shampoo.  3.  After you shampoo, rinse your hair and body thoroughly to remove the  shampoo.                           4.  Use CHG as you would any other liquid soap.  You can apply chg directly  to the skin and wash                       Gently with a scrungie or clean washcloth.  5.  Apply the CHG Soap  to your body ONLY FROM THE NECK DOWN.   Do not use on face/ open                           Wound or open sores. Avoid contact with eyes, ears mouth and genitals (private parts).                       Wash face,  Genitals (private parts) with your normal soap.             6.  Wash thoroughly, paying special attention to the area where your surgery  will be performed.  7.  Thoroughly rinse your body with warm water from the neck down.  8.  DO NOT shower/wash with your normal soap after using and rinsing off  the CHG Soap.                9.  Pat yourself dry with a clean towel.            10.  Wear clean pajamas.            11.  Place clean sheets on your bed the night of your first shower and do not  sleep with pets. Day of Surgery : Do not apply any lotions/deodorants the morning of surgery.  Please wear clean clothes to the hospital/surgery center.  FAILURE TO FOLLOW THESE INSTRUCTIONS MAY RESULT IN THE CANCELLATION OF YOUR SURGERY PATIENT  SIGNATURE_________________________________  NURSE SIGNATURE__________________________________  ________________________________________________________________________

## 2020-08-14 NOTE — Progress Notes (Unsigned)
[facility]  Patient Information: Name:  Bruce Robertson  DOB:  April 11, 1961  MRN:  [id number]  {TIP - You do not have to delete this tip  -  Copy the info from the staff message sent by the PAT staff  then press F2 here and paste the information using CTL - V on the next line :[id number]}  Planned Procedure: Hemorrhoidectomy  Surgeon: Dr. Michael Boston  Date of Procedure: 08/23/20  Cautery will be used.  Position during surgery:    Please send documentation back to:  [facility] (Fax # [phone])    Device Information:  Clinic EP Physician:  Allegra Lai, MD   Device Type:  Defibrillator Manufacturer and Phone #:  Medtronic: [id number] Pacemaker Dependent?:  No. Date of Last Device Check:  08/10/20 Normal Device Function?:  Yes.    Electrophysiologist's Recommendations:   Have magnet available.  Provide continuous ECG monitoring when magnet is used or reprogramming is to be performed.   Procedure should not interfere with device function.  No device programming or magnet placement needed.  Per Device Clinic Standing Orders, Simone Curia, RN  2:43 PM 08/14/2020

## 2020-08-15 ENCOUNTER — Encounter (HOSPITAL_COMMUNITY)
Admission: RE | Admit: 2020-08-15 | Discharge: 2020-08-15 | Disposition: A | Discharge status: Home / Self Care | Payer: Medicare HMO | Source: Ambulatory Visit | Attending: Surgery | Admitting: Surgery

## 2020-08-15 ENCOUNTER — Encounter (HOSPITAL_COMMUNITY): Payer: Self-pay

## 2020-08-15 ENCOUNTER — Other Ambulatory Visit: Payer: Self-pay

## 2020-08-15 DIAGNOSIS — Z01818 Encounter for other preprocedural examination: Secondary | ICD-10-CM | POA: Diagnosis not present

## 2020-08-15 HISTORY — DX: Type 2 diabetes mellitus without complications: E11.9

## 2020-08-15 LAB — BASIC METABOLIC PANEL
Anion gap: 8 (ref 5–15)
BUN: 24 mg/dL — ABNORMAL HIGH (ref 6–20)
CO2: 29 mmol/L (ref 22–32)
Calcium: 9.5 mg/dL (ref 8.9–10.3)
Chloride: 99 mmol/L (ref 98–111)
Creatinine, Ser: 1.82 mg/dL — ABNORMAL HIGH (ref 0.61–1.24)
GFR, Estimated: 40 mL/min — ABNORMAL LOW (ref >60)
Glucose, Bld: 106 mg/dL — ABNORMAL HIGH (ref 70–99)
Potassium: 4.1 mmol/L (ref 3.5–5.1)
Sodium: 136 mmol/L (ref 135–145)

## 2020-08-15 LAB — CBC
HCT: 37.8 % — ABNORMAL LOW (ref 39.0–52.0)
Hemoglobin: 11.9 g/dL — ABNORMAL LOW (ref 13.0–17.0)
MCH: 27 pg (ref 26.0–34.0)
MCHC: 31.5 g/dL (ref 30.0–36.0)
MCV: 85.7 fL (ref 80.0–100.0)
Platelets: 167 10*3/uL (ref 150–400)
RBC: 4.41 MIL/uL (ref 4.22–5.81)
RDW: 13.6 % (ref 11.5–15.5)
WBC: 5.5 10*3/uL (ref 4.0–10.5)
nRBC: 0 % (ref 0.0–0.2)

## 2020-08-15 LAB — GLUCOSE, CAPILLARY: Glucose-Capillary: 104 mg/dL — ABNORMAL HIGH (ref 70–99)

## 2020-08-15 LAB — HEMOGLOBIN A1C
Hgb A1c MFr Bld: 6.1 % — ABNORMAL HIGH (ref 4.8–5.6)
Mean Plasma Glucose: 128.37 mg/dL

## 2020-08-15 NOTE — Progress Notes (Signed)
Lab. Report: Cr: 1.82.

## 2020-08-15 NOTE — Progress Notes (Signed)
COVID Vaccine Completed: YES Date COVID Vaccine completed: 06/03/20 COVID vaccine manufacturer: Pfizer     PCP - Dr. Micheline Rough Cardiologist -  Dr. Minus Breeding. LOV: 06/29/20  Chest x-ray - 02/12/20. CT: 03/01/20 EKG - 05/24/20 Stress Test -  ECHO - 02/04/20 Cardiac Cath -  Pacemaker/ICD device last checked: 05/18/20  Sleep Study - Yes CPAP - Yes  Fasting Blood Sugar - Do not check it fasting Checks Blood Sugar __3___ times a week  Blood Thinner Instructions: RN advised pt. To call cardiologist for specific instructions on Eliquis before and after surgery. Aspirin Instructions: Last Dose:  Anesthesia review: Hx: HTN,DIA,CHF(ICD),Cardiomyopathy,OSA(CPAP).  Patient denies shortness of breath, fever, cough and chest pain at PAT appointment   Patient verbalized understanding of instructions that were given to them at the PAT appointment. Patient was also instructed that they will need to review over the PAT instructions again at home before surgery.

## 2020-08-17 ENCOUNTER — Ambulatory Visit (INDEPENDENT_AMBULATORY_CARE_PROVIDER_SITE_OTHER): Payer: Medicare HMO

## 2020-08-17 DIAGNOSIS — I428 Other cardiomyopathies: Secondary | ICD-10-CM | POA: Diagnosis not present

## 2020-08-17 LAB — CUP PACEART REMOTE DEVICE CHECK
Battery Remaining Longevity: 103 mo
Battery Voltage: 3 V
Brady Statistic RV Percent Paced: 1.14 %
Date Time Interrogation Session: 20211014022823
HighPow Impedance: 66 Ohm
Implantable Lead Implant Date: 20180517
Implantable Lead Location: 753860
Implantable Pulse Generator Implant Date: 20180517
Lead Channel Impedance Value: 361 Ohm
Lead Channel Impedance Value: 456 Ohm
Lead Channel Pacing Threshold Amplitude: 0.625 V
Lead Channel Pacing Threshold Pulse Width: 0.4 ms
Lead Channel Sensing Intrinsic Amplitude: 10.25 mV
Lead Channel Sensing Intrinsic Amplitude: 10.25 mV
Lead Channel Setting Pacing Amplitude: 2.5 V
Lead Channel Setting Pacing Pulse Width: 0.4 ms
Lead Channel Setting Sensing Sensitivity: 0.3 mV

## 2020-08-17 NOTE — Progress Notes (Signed)
Anesthesia Chart Review   Case: [id number] Date/Time: 08/23/20 1445   Procedures:      HEMORRHOIDECTOMY WITH LIGATION AND HEMORRHOIDOPEXY (N/A )     RECTAL EXAM UNDER ANESTHESIA (Left )   Anesthesia type: General   Pre-op diagnosis: HEMORRHOIDS PROLAPSING GRADE3. WITH BLEEDING   Location: [facility] ROOM 02 / [facility]   Surgeons: Michael Boston, MD      DISCUSSION:59 y.o. former smoker (1.75 pack years) with h/o OSA on biPAP, DM II, GERD, nonischemic cardiomyopathy (EF 15-20%), AICD in place (last check 05/18/2020, device orders on chart), CAD (s/p CABG), PAF (on Eliquis), Grade 3 prolapsing hemorrhoids scheduled for above procedure 08/23/2020 with Dr. Michael Boston.   Clearance from cardiology received which states, "He may proceed with proposed procedure under general anesthesia.  Given Mr. Cieslinski' history, he is at intermediate risk for cardiovascular complications and does not require further cardiovascular testing prior to proceeding with the proposed procedure of hemorrhoidectomy.  It is permissible for Mr. Tsou to hold is eliquis 48 hours prior to procedure and should be resumed as soon as deemed safe by surgical team.  He should wear his BIPAP during procedure, as recent echocardiogram from outside facility also suggested significant pulmonary hypertension."   VS: BP (!) 88/55   Pulse (!) 56   Temp 36.7 C (Oral)   Resp 18   Ht 6' (1.829 m)   Wt 103.4 kg   SpO2 100%   BMI 30.92 kg/m   PROVIDERS: Caren Macadam, MD is PCP   Lattie Haw, MD is Cardiologist  LABS: Labs reviewed: Acceptable for surgery. (all labs ordered are listed, but only abnormal results are displayed)  Labs Reviewed  CBC - Abnormal; Notable for the following components:      Result Value   Hemoglobin 11.9 (*)    HCT 37.8 (*)    All other components within normal limits  BASIC METABOLIC PANEL - Abnormal; Notable for the following components:   Glucose, Bld 106 (*)    BUN 24 (*)    Creatinine, Ser  1.82 (*)    GFR, Estimated 40 (*)    All other components within normal limits  HEMOGLOBIN A1C - Abnormal; Notable for the following components:   Hgb A1c MFr Bld 6.1 (*)    All other components within normal limits  GLUCOSE, CAPILLARY - Abnormal; Notable for the following components:   Glucose-Capillary 104 (*)    All other components within normal limits     IMAGES:   EKG: 05/24/20 Rate 51 bpm  Sinus arrhythmia Left posterior fascicular block Low voltage with right axis deviation   CV: Echo 02/04/2020 IMPRESSIONS    1. Left ventricular ejection fraction, by estimation, is 15-20%. The left  ventricle has severely decreased function. The left ventricle demonstrates  global hypokinesis. The left ventricular internal cavity size was severely  dilated. Left ventricular  diastolic parameters are indeterminate. Elevated left ventricular  end-diastolic pressure.  2. Right ventricular systolic function is moderately reduced. The right  ventricular size is moderately enlarged. There is severely elevated  pulmonary artery systolic pressure. The estimated right ventricular  systolic pressure is 41.3 mmHg.  3. Left atrial size was moderately dilated.  4. Right atrial size was moderate-severely dilated.  5. The mitral valve is degenerative. Mild mitral valve regurgitation. No  evidence of mitral stenosis.  6. Tricuspid valve regurgitation is severe.  7. The aortic valve is abnormal. Aortic valve regurgitation is mild. No  aortic stenosis is present.  8. The  inferior vena cava is dilated in size with <50% respiratory  variability, suggesting right atrial pressure of 15 mmHg. Past Medical History:  Diagnosis Date  . AICD (automatic cardioverter/defibrillator) present   . Cardiomyopathy    Nonischemic. EF has been about 45%. S/P CABG.;  b.  Echo 4/14: EF 25%, global HK with inf and mid apical AK, restrictive physiology with E/e' > 15 (elevated LV filling pressure), trivial  AI/MR, mild to mod LAE, mild RVE, mild reduced RVSF, mild RAE, mod TR, PASP 74 (severe pulmonary HTN)   . CHF (congestive heart failure) ([place])   . CHF exacerbation ([place]) 07/31/2016  . Colon polyps 2002  . Coronary artery disease   . Diabetes mellitus without complication ([place])   . Dyslipidemia    hx (03/20/2017)  . GERD (gastroesophageal reflux disease)   . History of bleeding peptic ulcer   . History of echocardiogram    Echo 10/16:  EF 25-30%, poss non-compaction, diff HK with inf-lat and apical HK, restrictive physio, mild AI, severe LAE, mild RVE with mild reduced RVSF, PASP 42 mmHg  . Hyperplastic rectal polyp   . OSA on CPAP    Mild  . Pneumonia   . Tobacco abuse    Remote    Past Surgical History:  Procedure Laterality Date  . CARDIAC CATHETERIZATION  2007;  2009  . CARDIAC DEFIBRILLATOR PLACEMENT  03/20/2017  . COLONOSCOPY WITH PROPOFOL N/A 01/21/2019   Procedure: COLONOSCOPY WITH PROPOFOL;  Surgeon: Milus Banister, MD;  Location: [facility];  Service: Endoscopy;  Laterality: N/A;  . CORONARY ARTERY BYPASS GRAFT  2007   Had a left main dissection after catheterization. Underwent a saphenous vein graft to the LAD and a saphenous vein graft to obtuse marginal.  . ICD IMPLANT N/A 03/20/2017   Procedure: ICD Implant;  Surgeon: Constance Haw, MD;  Location: [facility];  Service: Cardiovascular;  Laterality: N/A;  . PATELLAR TENDON REPAIR Right 2005  . POLYPECTOMY  01/21/2019   Procedure: POLYPECTOMY;  Surgeon: Milus Banister, MD;  Location: [facility];  Service: Endoscopy;;    MEDICATIONS: . ACCU-CHEK GUIDE test strip  . Accu-Chek Softclix Lancets lancets  . apixaban (ELIQUIS) 5 MG TABS tablet  . blood glucose meter kit and supplies KIT  . carvedilol (COREG) 12.5 MG tablet  . JARDIANCE 10 MG TABS tablet  . Multiple Vitamin (MULTIVITAMIN WITH MINERALS) TABS tablet  . omeprazole (PRILOSEC) 40 MG capsule  . sacubitril-valsartan (ENTRESTO) 24-26 MG  .  simvastatin (ZOCOR) 40 MG tablet  . spironolactone (ALDACTONE) 25 MG tablet  . torsemide (DEMADEX) 20 MG tablet   No current facility-administered medications for this encounter.    Konrad Felix, PA-C [facility]Surgical Testing [phone]

## 2020-08-19 ENCOUNTER — Other Ambulatory Visit (HOSPITAL_COMMUNITY)
Admission: RE | Admit: 2020-08-19 | Discharge: 2020-08-19 | Disposition: A | Discharge status: Home / Self Care | Payer: Medicare HMO | Source: Ambulatory Visit | Attending: Surgery | Admitting: Surgery

## 2020-08-19 DIAGNOSIS — Z20822 Contact with and (suspected) exposure to covid-19: Secondary | ICD-10-CM | POA: Insufficient documentation

## 2020-08-19 DIAGNOSIS — Z01812 Encounter for preprocedural laboratory examination: Secondary | ICD-10-CM | POA: Diagnosis not present

## 2020-08-19 LAB — SARS CORONAVIRUS 2 (TAT 6-24 HRS): SARS Coronavirus 2: NEGATIVE

## 2020-08-22 MED ORDER — BUPIVACAINE LIPOSOME 1.3 % IJ SUSP
20.0000 mL | Freq: Once | INTRAMUSCULAR | Status: DC
  Filled 2020-08-22: qty 20

## 2020-08-23 ENCOUNTER — Ambulatory Visit (HOSPITAL_COMMUNITY)
Admission: RE | Admit: 2020-08-23 | Discharge: 2020-08-23 | Disposition: A | Discharge status: Home / Self Care | Payer: Medicare HMO | Attending: Surgery | Admitting: Surgery

## 2020-08-23 ENCOUNTER — Other Ambulatory Visit: Payer: Self-pay

## 2020-08-23 ENCOUNTER — Ambulatory Visit (HOSPITAL_COMMUNITY): Payer: Medicare HMO | Admitting: Physician Assistant

## 2020-08-23 ENCOUNTER — Ambulatory Visit (HOSPITAL_COMMUNITY): Payer: Medicare HMO | Admitting: Certified Registered Nurse Anesthetist

## 2020-08-23 ENCOUNTER — Encounter (HOSPITAL_COMMUNITY): Payer: Self-pay | Admitting: Surgery

## 2020-08-23 ENCOUNTER — Encounter (HOSPITAL_COMMUNITY)
Admission: RE | Disposition: A | Discharge status: Home / Self Care | Payer: Self-pay | Source: Home / Self Care | Attending: Surgery

## 2020-08-23 DIAGNOSIS — Z7982 Long term (current) use of aspirin: Secondary | ICD-10-CM | POA: Insufficient documentation

## 2020-08-23 DIAGNOSIS — E119 Type 2 diabetes mellitus without complications: Secondary | ICD-10-CM | POA: Insufficient documentation

## 2020-08-23 DIAGNOSIS — K219 Gastro-esophageal reflux disease without esophagitis: Secondary | ICD-10-CM | POA: Diagnosis not present

## 2020-08-23 DIAGNOSIS — Z7901 Long term (current) use of anticoagulants: Secondary | ICD-10-CM | POA: Diagnosis not present

## 2020-08-23 DIAGNOSIS — I2510 Atherosclerotic heart disease of native coronary artery without angina pectoris: Secondary | ICD-10-CM | POA: Insufficient documentation

## 2020-08-23 DIAGNOSIS — Z87891 Personal history of nicotine dependence: Secondary | ICD-10-CM | POA: Diagnosis not present

## 2020-08-23 DIAGNOSIS — K642 Third degree hemorrhoids: Secondary | ICD-10-CM | POA: Diagnosis not present

## 2020-08-23 DIAGNOSIS — Z79899 Other long term (current) drug therapy: Secondary | ICD-10-CM | POA: Insufficient documentation

## 2020-08-23 DIAGNOSIS — Z95810 Presence of automatic (implantable) cardiac defibrillator: Secondary | ICD-10-CM | POA: Diagnosis present

## 2020-08-23 DIAGNOSIS — I5023 Acute on chronic systolic (congestive) heart failure: Secondary | ICD-10-CM | POA: Diagnosis not present

## 2020-08-23 DIAGNOSIS — Z951 Presence of aortocoronary bypass graft: Secondary | ICD-10-CM | POA: Diagnosis not present

## 2020-08-23 DIAGNOSIS — G4730 Sleep apnea, unspecified: Secondary | ICD-10-CM | POA: Insufficient documentation

## 2020-08-23 DIAGNOSIS — K648 Other hemorrhoids: Secondary | ICD-10-CM | POA: Diagnosis not present

## 2020-08-23 DIAGNOSIS — I482 Chronic atrial fibrillation, unspecified: Secondary | ICD-10-CM | POA: Insufficient documentation

## 2020-08-23 DIAGNOSIS — I509 Heart failure, unspecified: Secondary | ICD-10-CM | POA: Diagnosis not present

## 2020-08-23 DIAGNOSIS — I5022 Chronic systolic (congestive) heart failure: Secondary | ICD-10-CM | POA: Diagnosis present

## 2020-08-23 DIAGNOSIS — K644 Residual hemorrhoidal skin tags: Secondary | ICD-10-CM | POA: Insufficient documentation

## 2020-08-23 DIAGNOSIS — N183 Chronic kidney disease, stage 3 unspecified: Secondary | ICD-10-CM | POA: Diagnosis not present

## 2020-08-23 DIAGNOSIS — I420 Dilated cardiomyopathy: Secondary | ICD-10-CM | POA: Diagnosis present

## 2020-08-23 DIAGNOSIS — D500 Iron deficiency anemia secondary to blood loss (chronic): Secondary | ICD-10-CM | POA: Insufficient documentation

## 2020-08-23 DIAGNOSIS — K649 Unspecified hemorrhoids: Secondary | ICD-10-CM | POA: Diagnosis present

## 2020-08-23 DIAGNOSIS — I130 Hypertensive heart and chronic kidney disease with heart failure and stage 1 through stage 4 chronic kidney disease, or unspecified chronic kidney disease: Secondary | ICD-10-CM | POA: Diagnosis not present

## 2020-08-23 HISTORY — PX: RECTAL EXAM UNDER ANESTHESIA: SHX6399

## 2020-08-23 HISTORY — PX: HEMORRHOID SURGERY: SHX153

## 2020-08-23 LAB — GLUCOSE, CAPILLARY: Glucose-Capillary: 104 mg/dL — ABNORMAL HIGH (ref 70–99)

## 2020-08-23 SURGERY — HEMORRHOIDECTOMY
Anesthesia: General

## 2020-08-23 MED ORDER — ORAL CARE MOUTH RINSE: 15.0000 mL | Freq: Once | OROMUCOSAL | Status: AC

## 2020-08-23 MED ORDER — ROCURONIUM BROMIDE 10 MG/ML (PF) SYRINGE
PREFILLED_SYRINGE | INTRAVENOUS | Status: DC | PRN
  Administered 2020-08-23: 50 mg via INTRAVENOUS

## 2020-08-23 MED ORDER — OXYCODONE HCL 5 MG PO TABS: 5.0000 mg | ORAL_TABLET | Freq: Four times a day (QID) | ORAL | 0 refills | Status: DC | PRN

## 2020-08-23 MED ORDER — CHLORHEXIDINE GLUCONATE CLOTH 2 % EX PADS: 6.0000 | MEDICATED_PAD | Freq: Once | CUTANEOUS | Status: DC

## 2020-08-23 MED ORDER — ROCURONIUM BROMIDE 10 MG/ML (PF) SYRINGE
PREFILLED_SYRINGE | INTRAVENOUS | Status: AC
  Filled 2020-08-23: qty 10

## 2020-08-23 MED ORDER — MIDAZOLAM HCL 5 MG/5ML IJ SOLN
INTRAMUSCULAR | Status: DC | PRN
  Administered 2020-08-23: 2 mg via INTRAVENOUS

## 2020-08-23 MED ORDER — SODIUM CHLORIDE 0.9 % IV SOLN
2.0000 g | INTRAVENOUS | Status: AC
  Administered 2020-08-23: 2 g via INTRAVENOUS
  Filled 2020-08-23: qty 20

## 2020-08-23 MED ORDER — MEPERIDINE HCL 50 MG/ML IJ SOLN: 6.2500 mg | INTRAMUSCULAR | Status: DC | PRN

## 2020-08-23 MED ORDER — DIAZEPAM 5 MG PO TABS
5.0000 mg | ORAL_TABLET | Freq: Four times a day (QID) | ORAL | 2 refills | Status: DC | PRN
Start: 2020-08-23 — End: 2020-09-21

## 2020-08-23 MED ORDER — ENSURE PRE-SURGERY PO LIQD
296.0000 mL | Freq: Once | ORAL | Status: DC
  Filled 2020-08-23: qty 296

## 2020-08-23 MED ORDER — CHLORHEXIDINE GLUCONATE 0.12 % MT SOLN
15.0000 mL | Freq: Once | OROMUCOSAL | Status: AC
  Administered 2020-08-23: 15 mL via OROMUCOSAL

## 2020-08-23 MED ORDER — BUPIVACAINE-EPINEPHRINE (PF) 0.5% -1:200000 IJ SOLN
INTRAMUSCULAR | Status: AC
  Filled 2020-08-23: qty 30

## 2020-08-23 MED ORDER — DEXAMETHASONE SODIUM PHOSPHATE 10 MG/ML IJ SOLN
INTRAMUSCULAR | Status: DC | PRN
  Administered 2020-08-23: 10 mg via INTRAVENOUS

## 2020-08-23 MED ORDER — BUPIVACAINE-EPINEPHRINE 0.5% -1:200000 IJ SOLN
INTRAMUSCULAR | Status: DC | PRN
  Administered 2020-08-23: 20 mL

## 2020-08-23 MED ORDER — FENTANYL CITRATE (PF) 250 MCG/5ML IJ SOLN
INTRAMUSCULAR | Status: AC
  Filled 2020-08-23: qty 5

## 2020-08-23 MED ORDER — BUPIVACAINE LIPOSOME 1.3 % IJ SUSP
INTRAMUSCULAR | Status: DC | PRN
  Administered 2020-08-23: 20 mL

## 2020-08-23 MED ORDER — LACTATED RINGERS IV SOLN: INTRAVENOUS | Status: DC

## 2020-08-23 MED ORDER — LIDOCAINE 2% (20 MG/ML) 5 ML SYRINGE
INTRAMUSCULAR | Status: DC | PRN
  Administered 2020-08-23: 100 mg via INTRAVENOUS

## 2020-08-23 MED ORDER — ONDANSETRON HCL 4 MG/2ML IJ SOLN
INTRAMUSCULAR | Status: DC | PRN
  Administered 2020-08-23: 4 mg via INTRAVENOUS

## 2020-08-23 MED ORDER — DEXAMETHASONE SODIUM PHOSPHATE 10 MG/ML IJ SOLN
INTRAMUSCULAR | Status: AC
  Filled 2020-08-23: qty 1

## 2020-08-23 MED ORDER — ONDANSETRON HCL 4 MG/2ML IJ SOLN
INTRAMUSCULAR | Status: AC
  Filled 2020-08-23: qty 2

## 2020-08-23 MED ORDER — HYDROMORPHONE HCL 1 MG/ML IJ SOLN
0.2500 mg | INTRAMUSCULAR | Status: DC | PRN
  Administered 2020-08-23: 0.5 mg via INTRAVENOUS

## 2020-08-23 MED ORDER — ACETAMINOPHEN 500 MG PO TABS
1000.0000 mg | ORAL_TABLET | ORAL | Status: AC
  Administered 2020-08-23: 1000 mg via ORAL
  Filled 2020-08-23: qty 2

## 2020-08-23 MED ORDER — METRONIDAZOLE IN NACL 5-0.79 MG/ML-% IV SOLN
500.0000 mg | INTRAVENOUS | Status: AC
  Administered 2020-08-23: 500 mg via INTRAVENOUS
  Filled 2020-08-23: qty 100

## 2020-08-23 MED ORDER — SUGAMMADEX SODIUM 200 MG/2ML IV SOLN
INTRAVENOUS | Status: DC | PRN
  Administered 2020-08-23: 200 mg via INTRAVENOUS

## 2020-08-23 MED ORDER — DIAZEPAM 5 MG PO TABS
5.0000 mg | ORAL_TABLET | Freq: Four times a day (QID) | ORAL | 2 refills | Status: DC | PRN
Start: 2020-08-23 — End: 2020-08-23

## 2020-08-23 MED ORDER — ETOMIDATE 2 MG/ML IV SOLN
INTRAVENOUS | Status: AC
  Filled 2020-08-23: qty 10

## 2020-08-23 MED ORDER — FENTANYL CITRATE (PF) 100 MCG/2ML IJ SOLN
INTRAMUSCULAR | Status: DC | PRN
  Administered 2020-08-23: 100 ug via INTRAVENOUS
  Administered 2020-08-23: 50 ug via INTRAVENOUS

## 2020-08-23 MED ORDER — SUCCINYLCHOLINE CHLORIDE 200 MG/10ML IV SOSY
PREFILLED_SYRINGE | INTRAVENOUS | Status: DC | PRN
  Administered 2020-08-23: 160 mg via INTRAVENOUS

## 2020-08-23 MED ORDER — OXYCODONE HCL 5 MG PO TABS
5.0000 mg | ORAL_TABLET | Freq: Four times a day (QID) | ORAL | 0 refills | Status: DC | PRN
Start: 2020-08-23 — End: 2020-09-21

## 2020-08-23 MED ORDER — ONDANSETRON HCL 4 MG/2ML IJ SOLN
4.0000 mg | Freq: Once | INTRAMUSCULAR | Status: AC | PRN
  Administered 2020-08-23: 4 mg via INTRAVENOUS

## 2020-08-23 MED ORDER — ETOMIDATE 2 MG/ML IV SOLN
INTRAVENOUS | Status: DC | PRN
  Administered 2020-08-23: 14 mg via INTRAVENOUS

## 2020-08-23 MED ORDER — LIDOCAINE 2% (20 MG/ML) 5 ML SYRINGE
INTRAMUSCULAR | Status: AC
  Filled 2020-08-23: qty 5

## 2020-08-23 MED ORDER — MIDAZOLAM HCL 2 MG/2ML IJ SOLN
INTRAMUSCULAR | Status: AC
  Filled 2020-08-23: qty 2

## 2020-08-23 MED ORDER — GABAPENTIN 300 MG PO CAPS
300.0000 mg | ORAL_CAPSULE | ORAL | Status: AC
  Administered 2020-08-23: 300 mg via ORAL
  Filled 2020-08-23: qty 1

## 2020-08-23 MED ORDER — HYDROMORPHONE HCL 1 MG/ML IJ SOLN
INTRAMUSCULAR | Status: AC
  Filled 2020-08-23: qty 1

## 2020-08-23 SURGICAL SUPPLY — 35 items
APL SKNCLS STERI-STRIP NONHPOA (GAUZE/BANDAGES/DRESSINGS) ×2
BENZOIN TINCTURE PRP APPL 2/3 (GAUZE/BANDAGES/DRESSINGS) ×3 IMPLANT
BLADE SURG 15 STRL LF DISP TIS (BLADE) IMPLANT
BLADE SURG 15 STRL SS (BLADE) ×3
BRIEF STRETCH FOR OB PAD LRG (UNDERPADS AND DIAPERS) ×3 IMPLANT
CNTNR URN SCR LID CUP LEK RST (MISCELLANEOUS) ×2 IMPLANT
CONT SPEC 4OZ STRL OR WHT (MISCELLANEOUS) ×3
COVER SURGICAL LIGHT HANDLE (MISCELLANEOUS) ×3 IMPLANT
COVER WAND RF STERILE (DRAPES) IMPLANT
DECANTER SPIKE VIAL GLASS SM (MISCELLANEOUS) ×3 IMPLANT
DRAPE LAPAROTOMY T 102X78X121 (DRAPES) ×3 IMPLANT
DRSG PAD ABDOMINAL 8X10 ST (GAUZE/BANDAGES/DRESSINGS) ×1 IMPLANT
ELECT REM PT RETURN 15FT ADLT (MISCELLANEOUS) ×3 IMPLANT
GAUZE 4X4 16PLY RFD (DISPOSABLE) ×3 IMPLANT
GAUZE SPONGE 4X4 12PLY STRL (GAUZE/BANDAGES/DRESSINGS) ×1 IMPLANT
GLOVE ECLIPSE 8.0 STRL XLNG CF (GLOVE) ×3 IMPLANT
GLOVE INDICATOR 8.0 STRL GRN (GLOVE) ×3 IMPLANT
GOWN STRL REUS W/TWL XL LVL3 (GOWN DISPOSABLE) ×6 IMPLANT
KIT BASIN OR (CUSTOM PROCEDURE TRAY) ×3 IMPLANT
KIT TURNOVER KIT A (KITS) ×1 IMPLANT
LOOP VESSEL MAXI BLUE (MISCELLANEOUS) IMPLANT
NEEDLE HYPO 22GX1.5 SAFETY (NEEDLE) ×3 IMPLANT
PACK BASIC VI WITH GOWN DISP (CUSTOM PROCEDURE TRAY) ×3 IMPLANT
PENCIL SMOKE EVACUATOR (MISCELLANEOUS) IMPLANT
SHEARS HARMONIC 9CM CVD (BLADE) IMPLANT
SURGILUBE 2OZ TUBE FLIPTOP (MISCELLANEOUS) ×2 IMPLANT
SUT CHROMIC 2 0 SH (SUTURE) ×2 IMPLANT
SUT CHROMIC 3 0 SH 27 (SUTURE) ×1 IMPLANT
SUT VIC AB 2-0 SH 27 (SUTURE)
SUT VIC AB 2-0 SH 27X BRD (SUTURE) IMPLANT
SUT VIC AB 2-0 UR6 27 (SUTURE) ×19 IMPLANT
SYR 20ML LL LF (SYRINGE) ×3 IMPLANT
SYR 3ML LL SCALE MARK (SYRINGE) IMPLANT
TOWEL OR 17X26 10 PK STRL BLUE (TOWEL DISPOSABLE) ×3 IMPLANT
TOWEL OR NON WOVEN STRL DISP B (DISPOSABLE) ×3 IMPLANT

## 2020-08-23 NOTE — Op Note (Signed)
08/23/2020  3:20 PM  PATIENT:  Bruce Robertson  59 y.o. male  Patient Care Team: Caren Macadam, MD as PCP - General (Family Medicine) Constance Haw, MD as PCP - Electrophysiology (Cardiology) Minus Breeding, MD as PCP - [facility] Access (Cardiology) Minus Breeding, MD as PCP - Cardiology (Cardiology) Constance Haw, MD as Consulting Physician (Cardiology) Eli Hose, [facility] (Inactive) as Pharmacist (Pharmacist) Milus Banister, MD as Attending Physician (Gastroenterology) Michael Boston, MD as Consulting Physician (General Surgery) Riccardo Dubin, MD as Referring Physician (Cardiology)  PRE-OPERATIVE DIAGNOSIS:  HEMORRHOIDS PROLAPSING GRADE3. WITH BLEEDING  POST-OPERATIVE DIAGNOSIS:  HEMORRHOIDS PROLAPSING GRADE3. WITH BLEEDING  PROCEDURE:  Procedure(s): HEMORRHOIDECTOMY WITH LIGATION AND HEMORRHOIDOPEXY RECTAL EXAM UNDER ANESTHESIA  Internal and external hemorrhoidectomy x3 Internal hemorrhoidal ligation and pexy Anorectal examination under anesthesia  SURGEON:  Adin Hector, MD  ANESTHESIA:   General Anorectal & Local field block (0.25% bupivacaine with epinephrine mixed with Liposomal bupivacaine (Experel)   EBL:  Total I/O In: 200 [IV Piggyback:200] Out: - .  See operative record  Delay start of Pharmacological VTE agent (>24hrs) due to surgical blood loss or risk of bleeding:  NO  DRAINS: NONE  SPECIMEN:   Internal & external hemorrhoid x2 (left lateral & right anterior) Internal hemorrhoid x1 (anterior midline)  DISPOSITION OF SPECIMEN:  PATHOLOGY  COUNTS:  YES  PLAN OF CARE: Discharge home after PACU  PATIENT DISPOSITION:  PACU - hemodynamically stable.  INDICATION: Pleasant patient with struggles with hemorrhoids.  Not able to be managed in the office despite an improved bowel regimen.  History with chronic heart failure followed closely by cardiology in town as well as through [facility].  On chronic  anticoagulation.  Anemia from bleeding area more into severe enough to require hospitalization.  Stabilized.  Cardiac clearance done.  I recommended  examination under anesthesia and surgical treatment:  The anatomy & physiology of the anorectal region was discussed.  The pathophysiology of hemorrhoids and differential diagnosis was discussed.  Natural history risks without surgery was discussed.   I stressed the importance of a bowel regimen to have daily soft bowel movements to minimize progression of disease.  Interventions such as sclerotherapy & banding were discussed.  The patient's symptoms are not adequately controlled by medicines and other non-operative treatments.  I feel the risks & problems of no surgery outweigh the operative risks; therefore, I recommended surgery to treat the hemorrhoids by ligation, pexy, and possible resection.  Risks such as bleeding, infection, need for further treatment, heart attack, death, and other risks were discussed.   I noted a good likelihood this will help address the problem.  Goals of post-operative recovery were discussed as well.  Possibility that this will not correct all symptoms was explained.  Post-operative pain, bleeding, constipation, urinary difficulties, and other problems after surgery were discussed.  We will work to minimize complications.   Educational handouts further explaining the pathology, treatment options, and bowel regimen were given as well.  Questions were answered.  The patient expresses understanding & wishes to proceed with surgery.    OR FINDINGS: Very enlarged internal hemorrhoids especially in the anterior circumference.  Grade 3 with some external component.  Left lateral greater than right anterior with external components.  Internal/external hemorrhoidal hemorrhoidectomy0 done..  Persistent anterior midline hemorrhoidal pile excised as well.  Right posterior pile grade 2 - Ligation only there.  DESCRIPTION:   Informed  consent was confirmed. Patient underwent general anesthesia without difficulty. Patient was placed  into prone positioning.  The perianal region was prepped and draped in sterile fashion. Surgical time-out confirmed our plan.  I did digital rectal examination and then transitioned over to anoscopy to get a sense of the anatomy.  Findings noted above.   I proceeded to do hemorrhoidal ligation and pexy.  I used a 2-0 Vicryl suture on a UR-6 needle in a figure-of-eight fashion 6 cm proximal to the anal verge.  I started at the largest hemorrhoid pile, left lateral.  Because of redundant hemorrhoidal tissue too bulky to merely ligate or pexy, I excised the excess internal hemorrhoid piles longitudinally in a fusiform biconcave fashion, sparing the anal canal to avoid narrowing.  I then ran that stitch longitudinally more distally to close the hemorrhoidectomy wound to the anal verge over a large Parks self retaining retractor to avoid narrowing of the anal canal.  I then tied that stitch down to cause a hemorrhoidopexy.   I also had to do an excision at the  anterior midline and right anterior pile locations.  I then did hemorrhoidal ligation and pexy at the other 4 hemorrhoidal columns.  At the completion of this, all 6 anorectal columns were ligated and pexied in the classic hexagonal fashion (right anterior/lateral/posterior, left anterior/lateral/posterior).  I closed the external part of the hemorrhoidectomy wounds with interrupted horizontal mattress 2-0 chromic suture, leaving the last 5 mm open to allow natural drainage.    I redid anoscopy & examination.  At completion of this, all hemorrhoids had been removed or reduced into the rectum.  There is no more prolapse.  Internal & external anatomy was more more normal.  Hemostasis was good.  Fluffed gauze was on-laid over the perianal region.  No packing done.  Patient is being extubated go to go to the recovery room.  I had discussed postop care in detail  with the patient in the preop holding area.  Instructions for post-operative recovery and prescriptions are written. I discussed operative findings, updated the patient's status, discussed probable steps to recovery, and gave postoperative recommendations to the patient's significant other, Loel Ro. Recommendations were made.  Questions were answered.  She expressed understanding & appreciation.  Adin Hector, M.D., F.A.C.S. Gastrointestinal and Minimally Invasive Surgery [facility] [address] [place], [place] [phone] Main / Paging

## 2020-08-23 NOTE — Anesthesia Procedure Notes (Signed)
Procedure Name: Intubation Date/Time: 08/23/2020 1:52 PM Performed by: Gerald Leitz, CRNA Pre-anesthesia Checklist: Patient identified, Patient being monitored, Timeout performed, Emergency Drugs available and Suction available Patient Re-evaluated:Patient Re-evaluated prior to induction Oxygen Delivery Method: Circle system utilized Preoxygenation: Pre-oxygenation with 100% oxygen Induction Type: IV induction, Rapid sequence and Cricoid Pressure applied Ventilation: Mask ventilation without difficulty Laryngoscope Size: Mac and 3 Grade View: Grade I Tube type: Oral Tube size: 7.5 mm Number of attempts: 1 Airway Equipment and Method: Stylet Placement Confirmation: ETT inserted through vocal cords under direct vision,  positive ETCO2 and breath sounds checked- equal and bilateral Secured at: 21 cm Tube secured with: Tape Dental Injury: Teeth and Oropharynx as per pre-operative assessment

## 2020-08-23 NOTE — Progress Notes (Signed)
Pt unable to void in phase II. Notified Dr. Johney Maine - with his assistance was able to locate orders in "order history" regarding voiding and catheterization. Pt last voided at 1300, will allow 45 minutes more prior to I&O cath. Pt educated. Bladder scan performed, 175ml visualized. Will continue to monitor.  Coolidge Breeze, RN 08/23/2020

## 2020-08-23 NOTE — Transfer of Care (Signed)
Immediate Anesthesia Transfer of Care Note  Patient: Bruce Robertson  Procedure(s) Performed: Procedure(s): HEMORRHOIDECTOMY WITH LIGATION AND HEMORRHOIDOPEXY (N/A) RECTAL EXAM UNDER ANESTHESIA (Left)  Patient Location: PACU  Anesthesia Type:General  Level of Consciousness: Alert, Awake, Oriented  Airway & Oxygen Therapy: Patient Spontanous Breathing  Post-op Assessment: Report given to RN  Post vital signs: Reviewed and stable  Last Vitals:  Vitals:   08/23/20 1319  BP: 123/84  Pulse: 72  Resp: 17  Temp: 36.7 C  SpO2: 595%    Complications: No apparent anesthesia complications

## 2020-08-23 NOTE — Progress Notes (Signed)
Remote ICD transmission.   

## 2020-08-23 NOTE — Anesthesia Preprocedure Evaluation (Signed)
Anesthesia Evaluation  Patient identified by MRN, date of birth, ID band Patient awake    Reviewed: Allergy & Precautions, NPO status , Patient's Chart, lab work & pertinent test results  Airway Mallampati: I  TM Distance: >3 FB Neck ROM: Full    Dental   Pulmonary sleep apnea , former smoker,    Pulmonary exam normal        Cardiovascular + CAD and + CABG  Normal cardiovascular exam+ Cardiac Defibrillator      Neuro/Psych Anxiety Depression    GI/Hepatic GERD  Medicated and Controlled,  Endo/Other  diabetes  Renal/GU Renal InsufficiencyRenal disease     Musculoskeletal   Abdominal   Peds  Hematology   Anesthesia Other Findings   Reproductive/Obstetrics                             Anesthesia Physical Anesthesia Plan  ASA: III  Anesthesia Plan: General   Post-op Pain Management:    Induction: Intravenous  PONV Risk Score and Plan: 2 and Ondansetron and Midazolam  Airway Management Planned: Oral ETT  Additional Equipment:   Intra-op Plan:   Post-operative Plan: Extubation in OR  Informed Consent: I have reviewed the patients History and Physical, chart, labs and discussed the procedure including the risks, benefits and alternatives for the proposed anesthesia with the patient or authorized representative who has indicated his/her understanding and acceptance.       Plan Discussed with: CRNA and Surgeon  Anesthesia Plan Comments:         Anesthesia Quick Evaluation

## 2020-08-23 NOTE — H&P (Signed)
Elai Vanwyk DOB: 1961/07/29 Single / Language: [name] / Race: Black or African American Male  History of Present Illness Adin Hector MD; 06/12/2020 2:34 PM) The patient is a 59 year old male who presents for bright red blood per rectum. Note for "Bright red blood per rectum": ` ` ` Patient sent for surgical consultation at the request of [facility]  Chief Complaint: Worsening rectal bleeding. Probable hemorrhoids. ` ` The patient is a patient with many medical issues. Primarily cardiac in nature. Had coronary disease and required catheterization and emergency bypass grafting. Decreased ejection fraction. Followed around a few the [facility]. Occasionally due to cardiology as well. Recently had his medications adjusted including Eliquis. Had episodes approximately defibrillation testing hours at a time this year. Started on Eliquis in May. History of colonoscopy last year by Dr. Oretha Caprice was [facility] gastro[facility]. One small polyp. Moderate internal and external hemorrhoids noted. Had worsening rectal bleeding. Came in the hospital. Hemorrhoid etiology suspected. Hemoglobin not severely low. Recommended close outpatient follow-up.  Patient comes in today with his wife. He notes he moves his bowels once or twice a day. He is not on a fiber supplement. I told to do Metamucil. I encouraged him to continue that. Remains on Eliquis. He gets significant bleeding at least every other day. He does feel his hemorrhoids pop out with every bowel movement. He notes this is been going on for years but is much more prominent this year, especially after being anticoagulated. He's never had any banding treatments. No personal nor family history of GI/colon cancer, inflammatory bowel disease, irritable bowel syndrome, allergy such as Celiac Sprue, dietary/dairy problems, colitis, ulcers nor gastritis. No recent sick contacts/gastroenteritis. No travel outside  the country. No changes in diet. No dysphagia to solids or liquids. No significant heartburn or reflux. No melena, hematemesis, coffee ground emesis. He recalls being diagnosed with an ulcer but has not had any bleeding issues in quite some time. On omeprazole. He can walk about 10-15 minutes before having to stop. He's been trying to work out in the gym but is hesitant to be too active with his heart issues in rectal bleeding  (Review of systems as stated in this history (HPI) or in the review of systems. Otherwise all other 12 point ROS are negative) ` ` `  This patient encounter took 40 minutes today to perform the following: obtain history, perform exam, review outside records, interpret tests & imaging, counsel the patient on their diagnosis; and, document this encounter, including findings & plan in the electronic health record (EHR).   Past Surgical History [name], CMA; 06/12/2020 2:06 PM) Colon Polyp Removal - Colonoscopy Coronary Artery Bypass Graft Knee Surgery Right.  Diagnostic Studies History [name], CMA; 06/12/2020 2:06 PM) Colonoscopy 1-5 years ago  Allergies [name], CMA; 06/12/2020 2:07 PM) No Known Drug Allergies [06/12/2020]:  Medication History ([name], CMA; 06/12/2020 2:08 PM) Carvedilol (12.5MG  Tablet, Oral) Active. Eliquis (5MG  Tablet, Oral) Active. Entresto (24-26MG  Tablet, Oral) Active. Omeprazole (40MG  Capsule DR, Oral) Active. Simvastatin (40MG  Tablet, Oral) Active. Spironolactone (25MG  Tablet, Oral) Active. Torsemide (20MG  Tablet, Oral) Active. Baby Aspirin (81MG  Tablet Chewable, Oral) Active. Multivitamin (Oral) Active. Medications Reconciled  Social History [name], CMA; 06/12/2020 2:06 PM) Alcohol use Recently quit alcohol use. Caffeine use Carbonated beverages. Illicit drug use Prefer to discuss with provider. Tobacco use Former smoker.  Family History [name],  CMA; 06/12/2020 2:06 PM) Colon Polyps Father. Depression Daughter, Mother. Diabetes Mellitus Daughter.  Heart Disease Father. Hypertension Mother.  Other Problems [name], CMA; 06/12/2020 2:06 PM) Atrial Fibrillation Congestive Heart Failure Gastric Ulcer Gastroesophageal Reflux Disease Hemorrhoids Sleep Apnea Vascular Disease     Review of Systems ([name] CMA; 06/12/2020 2:06 PM) General Present- Chills and Fatigue. Not Present- Appetite Loss, Fever, Night Sweats, Weight Gain and Weight Loss. Skin Not Present- Change in Wart/Mole, Dryness, Hives, Jaundice, New Lesions, Non-Healing Wounds, Rash and Ulcer. HEENT Present- Oral Ulcers and Seasonal Allergies. Not Present- Earache, Hearing Loss, Hoarseness, Nose Bleed, Ringing in the Ears, Sinus Pain, Sore Throat, Visual Disturbances, Wears glasses/contact lenses and Yellow Eyes. Respiratory Present- Difficulty Breathing. Not Present- Bloody sputum, Chronic Cough, Snoring and Wheezing. Cardiovascular Present- Difficulty Breathing Lying Down and Shortness of Breath. Not Present- Chest Pain, Leg Cramps, Palpitations, Rapid Heart Rate and Swelling of Extremities. Gastrointestinal Present- Bloating, Bloody Stool, Constipation, Excessive gas, Hemorrhoids, Indigestion, Nausea, Rectal Pain and Vomiting. Not Present- Abdominal Pain, Change in Bowel Habits, Chronic diarrhea, Difficulty Swallowing and Gets full quickly at meals. Male Genitourinary Not Present- Blood in Urine, Change in Urinary Stream, Frequency, Impotence, Nocturia, Painful Urination, Urgency and Urine Leakage. Musculoskeletal Present- Joint Pain, Joint Stiffness and Muscle Weakness. Not Present- Back Pain, Muscle Pain and Swelling of Extremities. Neurological Present- Headaches, Numbness, Tingling, Tremor, Trouble walking and Weakness. Not Present- Decreased Memory, Fainting and Seizures. Psychiatric Not Present- Anxiety, Bipolar, Change in Sleep Pattern,  Depression, Fearful and Frequent crying. Endocrine Not Present- Cold Intolerance, Excessive Hunger, Hair Changes, Heat Intolerance, Hot flashes and New Diabetes. Hematology Present- Blood Thinners, Easy Bruising and Excessive bleeding. Not Present- Gland problems, HIV and Persistent Infections.  Vitals ([name] CMA; 06/12/2020 2:07 PM) 06/12/2020 2:06 PM Weight: 235 lb Height: 72in Body Surface Area: 2.28 m Body Mass Index: 31.87 kg/m  Pulse: 88 (Regular)  BP: 98/50(Sitting, Left Arm, Standard)   BP 123/84   Pulse 72   Temp 98 F (36.7 C) (Oral)   Resp 17   SpO2 100%  08/23/2020      Physical Exam Adin Hector MD; 06/12/2020 2:19 PM)  General Mental Status-Alert. General Appearance-Not in acute distress, Not Sickly. Orientation-Oriented X3. Hydration-Well hydrated. Voice-Normal.  Integumentary Global Assessment Upon inspection and palpation of skin surfaces of the - Axillae: non-tender, no inflammation or ulceration, no drainage. and Distribution of scalp and body hair is normal. General Characteristics Temperature - normal warmth is noted.  Head and Neck Head-normocephalic, atraumatic with no lesions or palpable masses. Face Global Assessment - atraumatic, no absence of expression. Neck Global Assessment - no abnormal movements, no bruit auscultated on the right, no bruit auscultated on the left, no decreased range of motion, non-tender. Trachea-midline. Thyroid Gland Characteristics - non-tender.  Eye Eyeball - Left-Extraocular movements intact, No Nystagmus - Left. Eyeball - Right-Extraocular movements intact, No Nystagmus - Right. Cornea - Left-No Hazy - Left. Cornea - Right-No Hazy - Right. Sclera/Conjunctiva - Left-No scleral icterus, No Discharge - Left. Sclera/Conjunctiva - Right-No scleral icterus, No Discharge - Right. Pupil - Left-Direct reaction to light normal. Pupil - Right-Direct  reaction to light normal.  ENMT Ears Pinna - Left - no drainage observed, no generalized tenderness observed. Pinna - Right - no drainage observed, no generalized tenderness observed. Nose and Sinuses External Inspection of the Nose - no destructive lesion observed. Inspection of the nares - Left - quiet respiration. Inspection of the nares - Right - quiet respiration. Mouth and Throat Lips - Upper Lip - no fissures observed, no pallor noted. Lower Lip -  no fissures observed, no pallor noted. Nasopharynx - no discharge present. Oral Cavity/Oropharynx - Tongue - no dryness observed. Oral Mucosa - no cyanosis observed. Hypopharynx - no evidence of airway distress observed.  Chest and Lung Exam Inspection Movements - Normal and Symmetrical. Accessory muscles - No use of accessory muscles in breathing. Palpation Palpation of the chest reveals - Non-tender. Auscultation Breath sounds - Normal and Clear.  Cardiovascular Auscultation Rhythm - Regular. Murmurs & Other Heart Sounds - Auscultation of the heart reveals - No Murmurs and No Systolic Clicks.  Abdomen Inspection Inspection of the abdomen reveals - No Visible peristalsis and No Abnormal pulsations. Umbilicus - No Bleeding, No Urine drainage. Palpation/Percussion Palpation and Percussion of the abdomen reveal - Soft, Non Tender, No Rebound tenderness, No Rigidity (guarding) and No Cutaneous hyperesthesia. Note: Abdomen soft. Nontender. Not distended. Umbilical hernia sensitive a reducible. No incisional hernias. No guarding.  Male Genitourinary Sexual Maturity Tanner 5 - Adult hair pattern and Adult penile size and shape. Note: No inguinal hernias. Normal external genitalia. Epididymi, testes, and spermatic cords normal without any masses.  Rectal Note: Enlarged internal hemorrhoids. Right anterior greater than left lateral greater than right posterior. Right anterior grade 3. Left probable grade 3 as well. Right  posterior grade 2.  No fissure. No fistula. No abscess. No pilonidal disease. Normal sphincter tone. Lites digital rectal exam. No other rectal masses found to 6 cm by digital exam  Peripheral Vascular Upper Extremity Inspection - Left - No Cyanotic nailbeds - Left, Not Ischemic. Inspection - Right - No Cyanotic nailbeds - Right, Not Ischemic.  Neurologic Neurologic evaluation reveals -normal attention span and ability to concentrate, able to name objects and repeat phrases. Appropriate fund of knowledge , normal sensation and normal coordination. Mental Status Affect - not angry, not paranoid. Cranial Nerves-Normal Bilaterally. Gait-Normal.  Neuropsychiatric Mental status exam performed with findings of-able to articulate well with normal speech/language, rate, volume and coherence, thought content normal with ability to perform basic computations and apply abstract reasoning and no evidence of hallucinations, delusions, obsessions or homicidal/suicidal ideation.  Musculoskeletal Global Assessment Spine, Ribs and Pelvis - no instability, subluxation or laxity. Right Upper Extremity - no instability, subluxation or laxity.  Lymphatic Head & Neck  General Head & Neck Lymphatics: Bilateral - Description - No Localized lymphadenopathy. Axillary  General Axillary Region: Bilateral - Description - No Localized lymphadenopathy. Femoral & Inguinal  Generalized Femoral & Inguinal Lymphatics: Left - Description - No Localized lymphadenopathy. Right - Description - No Localized lymphadenopathy.   Results Adin Hector MD; 06/12/2020 2:35 PM) Procedures  Name Value Date Hemorrhoids Procedure Internal exam: Internal Hemorroids ( non-bleeding) Internal Hemorrhoids (bleeding) prolapse Other: Enlarged internal hemorrhoids. Right anterior greater than left lateral greater than right posterior. Right anterior grade 3. Left probable grade 3 as well.  Right posterior grade 2............Marland KitchenNo fissure. No fistula. No abscess. No pilonidal disease. Normal sphincter tone. Lites digital rectal exam. No other rectal masses found to 6 cm by digital exam  Performed: 06/12/2020 2:19 PM    Assessment & Plan Adin Hector MD; 06/12/2020 2:35 PM)     PROLAPSED INTERNAL HEMORRHOIDS, GRADE 3 (K64.2) Impression: Worsening hemorrhoids with regular prolapse and bleeding almost with every bowel movement. Significant enough to require hospitalization. Need for anticoagulation with intermittent proximal atrial fibrillation in the setting of decreased cardiac function.  I think he would benefit from surgery. Hemorrhoidal ligation/pexy. Usually an outpatient surgery and was cardiology wants to watch him overnight.  Usually hold  Eliquis 2 days preop and the resume postoperative day 1. Cardiology is okay with it.  He notes hemorrhoids having on and off for years and is getting to be a daily problem. He is ready to proceed with surgery. Discussed with the patient is wife. They wish to proceed with surgery. We will double check there are no issues from a cardiology standpoint. He is recently transitioned over to [facility] for his cardiology care given his complex cardiac situation with atrial fibrillation and decreased ejection fraction   ENCOUNTER FOR PREOPERATIVE EXAMINATION FOR GENERAL SURGICAL PROCEDURE (Z01.818)  Current Plans You are being scheduled for surgery- Our schedulers will call you.  You should hear from our office's scheduling department within 5 working days about the location, date, and time of surgery. We try to make accommodations for patient's preferences in scheduling surgery, but sometimes the OR schedule or the surgeon's schedule prevents [place] from making those accommodations.  If you have not heard from our office [phone]) in 5 working days, call the office and ask for your surgeon's nurse.  If you  have other questions about your diagnosis, plan, or surgery, call the office and ask for your surgeon's nurse.  Pt Education - [facility] Rectal Prep for Anorectal outpatient/office surgery: discussed with patient and provided information. Pt Education - [facility] Rectal Surgery HCI (Amanii Snethen): discussed with patient and provided information.  INTERNAL BLEEDING HEMORRHOIDS (K64.8)  Current Plans ANOSCOPY, DIAGNOSTIC (17793) Pt Education - [facility] Hemorrhoids (Shalandria Elsbernd): discussed with patient and provided information. Pt Education - Pamphlet Given - The Hemorrhoid Book: discussed with patient and provided information. The anatomy & physiology of the anorectal region was discussed. The pathophysiology of hemorrhoids and differential diagnosis was discussed. Natural history risks without surgery was discussed. I stressed the importance of a bowel regimen to have daily soft bowel movements to minimize progression of disease. Interventions such as sclerotherapy & banding were discussed.  The patient's symptoms are not adequately controlled by medicines and other non-operative treatments. I feel the risks & problems of no surgery outweigh the operative risks; therefore, I recommended surgery to treat the hemorrhoids by ligation, pexy, and possible resection.  Risks such as bleeding, infection, urinary difficulties, need for further treatment, heart attack, death, and other risks were discussed. I noted a good likelihood this will help address the problem. Goals of post-operative recovery were discussed as well. Possibility that this will not correct all symptoms was explained. Post-operative pain, bleeding, constipation, and other problems after surgery were discussed. We will work to minimize complications. Educational handouts further explaining the pathology, treatment options, and bowel regimen were given as well. Questions were answered. The patient expresses understanding & wishes to proceed with  surgery.   CHRONIC EPISODIC ATRIAL FIBRILLATION (I48.20)   CHRONIC ANTICOAGULATION (Z79.01)  Adin Hector, MD, FACS, MASCRS Gastrointestinal and Minimally Invasive Surgery  [facility] [address] [place], [place] [address] [phone] Fax [phone] Main/Paging  CONTACT INFORMATION: Weekday (9AM-5PM) concerns: Call [facility] main office at [phone] Weeknight (5PM-9AM) or Weekend/Holiday concerns: Check [email or web address] for General Surgery [facility] coverage (Please, do not use SecureChat as it is not reliable communication to operating surgeons for immediate patient care)

## 2020-08-23 NOTE — Discharge Instructions (Signed)
ANORECTAL SURGERY:  POST OPERATIVE INSTRUCTIONS  ######################################################################  EAT Start with a pureed / full liquid diet After 24 hours, gradually transition to a high fiber diet.    CONTROL PAIN Control pain so you can tolerate bowel movements,  walk, sleep, tolerate sneezing/coughing, and go up/down stairs.   HAVE A BOWEL MOVEMENT DAILY Keep your bowels regular to avoid problems.   Taking a fiber supplement every day to keep bowels soft.   Try a laxative to override constipation. Use an antidairrheal to slow down diarrhea.   Call if not better after 2 tries  WALK Walk an hour a day.  Control your pain to do that.   CALL IF YOU HAVE PROBLEMS/CONCERNS Call if you are still struggling despite following these instructions. Call if you have concerns not answered by these instructions  ######################################################################    1. Take your usually prescribed home medications unless otherwise directed.  Restart your blood thinner (Eliquis) the day after surgery.  2. DIET: Follow a light bland diet & liquids the first 24 hours after arrival home, such as soup, liquids, starches, etc.  Be sure to drink plenty of fluids.  Quickly advance to a usual solid diet within a few days.  Avoid fast food or heavy meals as your are more likely to get nauseated or have irregular bowels.  A low-fat, high-fiber diet for the rest of your life is ideal.  3. PAIN CONTROL: a. Pain is best controlled by a usual combination of three different methods TOGETHER: i. Ice/Heat ii. Over the counter pain medication iii. Prescription pain medication b. Expect swelling and discomfort in the anus/rectal area.  Warm water baths (30-60 minutes up to 6 times a day, especially after bowel meovements) will help. Use ice for the first few days to help decrease swelling and bruising, then switch to heat such as warm towels, sitz baths, warm  baths, etc to help relax tight/sore spots and speed recovery.  Some people prefer to use ice alone, heat alone, alternating between ice & heat.  Experiment to what works for you.   c. It is helpful to take an over-the-counter pain medication continuously for the first few weeks.  Choose one of the following that works best for you: i. Naproxen (Aleve, etc)  Two 220mg  tabs twice a day ii. Ibuprofen (Advil, etc) Three 200mg  tabs four times a day (every meal & bedtime) iii. Acetaminophen (Tylenol, etc) 500-650mg  four times a day (every meal & bedtime) d. A  prescription for pain medication (such as oxycodone, hydrocodone, etc) should be given to you upon discharge.  Take your pain medication as prescribed.  i. If you are having problems/concerns with the prescription medicine (does not control pain, nausea, vomiting, rash, itching, etc), please call us [phone] to see if we need to switch you to a different pain medicine that will work better for you and/or control your side effect better. ii. If you need a refill on your pain medication, please contact your pharmacy.  They will contact our office to request authorization. Prescriptions will not be filled after 5 pm or on week-ends.  If can take up to 48 hours for it to be filled & ready so avoid waiting until you are down to thel ast pill. e. A topical cream (Dibucaine) or a prescription for a cream (such as diltiazem 2% gel) may be given to you.  Many people find relief with topical creams.  Some people find it burns too much.  Experiment.  If  it helps, use it.  If it burns, don't using it.  Use a Sitz Bath 4-8 times a day for relief   [employer] A sitz bath is a warm water bath taken in the sitting position that covers only the hips and buttocks. It may be used for either healing or hygiene purposes. Sitz baths are also used to relieve pain, itching, or muscle spasms. The water may contain medicine. Moist heat will help you heal and relax.  HOME  CARE INSTRUCTIONS  Take 3 to 4 sitz baths a day. 1. Fill the bathtub half full with warm water. 2. Sit in the water and open the drain a little. 3. Turn on the warm water to keep the tub half full. Keep the water running constantly. 4. Soak in the water for 15 to 20 minutes. 5. After the sitz bath, pat the affected area dry first.   4. KEEP YOUR BOWELS REGULAR a. The goal is one soft bowel movement a day b. Avoid getting constipated.  Between the surgery and the pain medications, it is common to experience some constipation.  Increasing fluid intake and taking a fiber supplement (such as Metamucil, Citrucel, FiberCon, MiraLax, etc) 2-3 times a day regularly will usually help prevent this problem from occurring.  A mild laxative (prune juice, Milk of Magnesia, MiraLax, etc) should be taken according to package directions if there are no bowel movements after 48 hours. c. Watch out for diarrhea.  If you have many loose bowel movements, simplify your diet to bland foods & liquids for a few days.  Stop any stool softeners and decrease your fiber supplement.  Switching to mild anti-diarrheal medications (Kayopectate, Pepto Bismol) can help.  Can try an imodium/loperamide dose.  If this worsens or does not improve, please call us.  5. Wound Care  a. Remove your bandages with your first bowel movement, usually the day after surgery.  Let the gauze fall off with the first bowel movement or shower.   b. Wear an absorbent pad or soft cotton balls in your underwear as needed to catch any drainage and help keep the area  c. Keep the area clean and dry.  Bathe / shower every day.  Keep the area clean by showering / bathing over the incision / wound.   It is okay to soak an open wound to help wash it.  Consider using a squeeze bottle filled with warm water to gently wash the anal area.  Wet wipes or showers / gentle washing after bowel movements is often less traumatic than regular toilet paper. d. Dennis Bast will  often notice bleeding with bowel movements.  This should slow down by the end of the first week of surgery.  Sitting on an ice pack can help. e. Expect some drainage.  This should slow down by the end of the first week of surgery, but you will have occasional bleeding or drainage up to a few months after surgery.  Wear an absorbent pad or soft cotton gauze in your underwear until the drainage stops.  6. ACTIVITIES as tolerated:   a. You may resume regular (light) daily activities beginning the next day--such as daily self-care, walking, climbing stairs--gradually increasing activities as tolerated.  If you can walk 30 minutes without difficulty, it is safe to try more intense activity such as jogging, treadmill, bicycling, low-impact aerobics, swimming, etc. b. Save the most intensive and strenuous activity for last such as sit-ups, heavy lifting, contact sports, etc  Refrain from any  heavy lifting or straining until you are off narcotics for pain control.   c. DO NOT PUSH THROUGH PAIN.  Let pain be your guide: If it hurts to do something, don't do it.  Pain is your body warning you to avoid that activity for another week until the pain goes down. d. You may drive when you are no longer taking prescription pain medication, you can comfortably sit for long periods of time, and you can safely maneuver your car and apply brakes. e. Dennis Bast may have sexual intercourse when it is comfortable.  7. FOLLOW UP in our office a. Please call [facility] at [phone] to set up an appointment to see your surgeon in the office for a follow-up appointment approximately 2-3 weeks after your surgery. b. Make sure that you call for this appointment the day you arrive home to ensure a convenient appointment time.  8. IF YOU HAVE DISABILITY OR FAMILY LEAVE FORMS, BRING THEM TO THE OFFICE FOR PROCESSING.  DO NOT GIVE THEM TO YOUR DOCTOR.        WHEN TO CALL us [phone]: 1. Poor pain control 2. Reactions / problems  with new medications (rash/itching, nausea, etc)  3. Fever over 101.5 F (38.5 C) 4. Inability to urinate 5. Nausea and/or vomiting 6. Worsening swelling or bruising 7. Continued bleeding from incision. 8. Increased pain, redness, or drainage from the incision  The clinic staff is available to answer your questions during regular business hours (8:30am-5pm).  Please don't hesitate to call and ask to speak to one of our nurses for clinical concerns.   A surgeon from [facility] is always on call at the hospitals   If you have a medical emergency, go to the nearest emergency room or call 911.    [facility], [place], [place], [place], [place]  [address] ? MAIN: [phone] ? TOLL FREE: [phone] ? FAX [phone] [email or web address]    HEMORRHOIDS   Hemorrhoidal piles are natural clusters of blood vessels that help the rectum and anal canal stretch to hold stool and allow bowel movements.  Most people will develop a flare of hemorrhoids in their lifetime.  When hemorrhoidals are irritated, they can swell, burn, itch, cause pain, and bleed.  Most flares will calm down gradually within a few weeks.  However, once hemorrhoids are created, they tend to flare more easily.  Fortunately, good habits and simple medical treatment usually control hemorrhoids well, and surgery is needed only in severe cases.  TREATMENT OF HEMORRHOID FLARE Warm soaks. 4-8 times a day This helps more than any topical medication.   1. A sitz bath is a warm water bath taken in the sitting position that covers only the hips and buttocks.Fill the bathtub half full with warm water. 2. Soak in the water for 15 to 30 minutes. 3. After the sitz bath, pat the affected area dry first.  Normalize your bowels.  Extremes of diarrhea or constipation will make hemorrhoids worse.  One soft bowel movement a day is the goal.   Wet wipes instead of toilet paper Pain  control with a NSAID such as ibuprofen (Advil) or naproxen (Aleve) or acetaminophen (Tylenol) around the clock.  Narcotics are constipating and should be minimized if possible Topical creams contain steroids (bydrocortisone) or local anesthetic (xylocaine) can help make pain and itching more tolerable.    TROUBLESHOOTING IRREGULAR BOWELS 1) Avoid extremes of bowel movements (no bad constipation/diarrhea) 2) Miralax 17gm in 8oz.  water or juice every day. May use twice a day.  3) Gas-x or Phazyme as needed for gas & bloating.  4) Soft & bland diet. No spicy, greasy, or fried foods.  5) Omeprazole over-the-counter as needed  6) May hold gluten/wheat products from diet to see if symptoms improve.  7)  May try probiotics (Align, Activa, etc) to help calm the bowels down 7) If symptoms become worse: Call back immediately.

## 2020-08-23 NOTE — Progress Notes (Signed)
Dr Conrad [place] reports ICD does not need interrogated.

## 2020-08-23 NOTE — Anesthesia Postprocedure Evaluation (Signed)
Anesthesia Post Note  Patient: Bruce Robertson  Procedure(s) Performed: HEMORRHOIDECTOMY WITH LIGATION AND HEMORRHOIDOPEXY (N/A ) RECTAL EXAM UNDER ANESTHESIA (Left )     Patient location during evaluation: PACU Anesthesia Type: General Level of consciousness: awake and alert Pain management: pain level controlled Vital Signs Assessment: post-procedure vital signs reviewed and stable Respiratory status: spontaneous breathing, nonlabored ventilation, respiratory function stable and patient connected to nasal cannula oxygen Cardiovascular status: blood pressure returned to baseline and stable Postop Assessment: no apparent nausea or vomiting Anesthetic complications: no   No complications documented.  Last Vitals:  Vitals:   08/23/20 1600 08/23/20 1615  BP: 100/67 130/62  Pulse: (!) 39 (!) 51  Resp: 13 18  Temp:  (!) 36.4 C  SpO2: 100% 100%    Last Pain:  Vitals:   08/23/20 1615  TempSrc:   PainSc: 8                  Deitra Craine DAVID

## 2020-08-24 ENCOUNTER — Encounter (HOSPITAL_COMMUNITY): Payer: Self-pay | Admitting: Surgery

## 2020-08-25 LAB — SURGICAL PATHOLOGY

## 2020-08-31 ENCOUNTER — Other Ambulatory Visit (HOSPITAL_COMMUNITY): Payer: Self-pay | Admitting: Physician Assistant

## 2020-09-11 ENCOUNTER — Encounter: Payer: Self-pay | Admitting: Family Medicine

## 2020-09-11 ENCOUNTER — Ambulatory Visit (INDEPENDENT_AMBULATORY_CARE_PROVIDER_SITE_OTHER): Payer: Medicare HMO | Admitting: Family Medicine

## 2020-09-11 ENCOUNTER — Other Ambulatory Visit: Payer: Self-pay

## 2020-09-11 VITALS — BP 100/70 | HR 60 | Temp 98.8°F | Wt 224.4 lb

## 2020-09-11 DIAGNOSIS — M25361 Other instability, right knee: Secondary | ICD-10-CM

## 2020-09-11 DIAGNOSIS — M25561 Pain in right knee: Secondary | ICD-10-CM | POA: Diagnosis not present

## 2020-09-11 NOTE — Progress Notes (Signed)
Subjective:    Patient ID: Bruce Robertson, male    DOB: November 21, 1960, 59 y.o.   MRN: [id number]  No chief complaint on file.   HPI Pt is a 59 year old male with past medical history significant for dilated cardiomyopathy with AICD in place, chronic systolic CHF HTN, A. fib, CKD stage III, HLD, h/o R knee patellar tendon repain followed by Dr. Ethlyn Gallery who was seen  for acute concern.  Pt with edema and pain of R knee x 3 days.  Pt notes symptoms started after getting out of bed and taking a step.  Knee felt like it "gave out".  Pt did not fall.  Denies hearing any pops, clicks, or tears.  Pt notes h/o R patellar tendon repair 8 yrs ago after a kick boxing injury.  Unsure of who repaired knee, but it was in [place].  Pt tired ibuprofen and icy hot for symptoms.  Unable to bend knee.  Ambulating with crutches.  States knee feels hard.   Past Medical History:  Diagnosis Date  . AICD (automatic cardioverter/defibrillator) present   . Anxiety 08/20/2016  . Cardiomyopathy    Nonischemic. EF has been about 45%. S/P CABG.;  b.  Echo 4/14: EF 25%, global HK with inf and mid apical AK, restrictive physiology with E/e' > 15 (elevated LV filling pressure), trivial AI/MR, mild to mod LAE, mild RVE, mild reduced RVSF, mild RAE, mod TR, PASP 74 (severe pulmonary HTN)   . CHF (congestive heart failure) ([place])   . CHF exacerbation ([place]) 07/31/2016  . Colon polyps 2002  . Coronary artery disease   . Diabetes mellitus without complication ([place])   . Dyslipidemia    hx (03/20/2017)  . GERD (gastroesophageal reflux disease)   . History of bleeding peptic ulcer   . History of echocardiogram    Echo 10/16:  EF 25-30%, poss non-compaction, diff HK with inf-lat and apical HK, restrictive physio, mild AI, severe LAE, mild RVE with mild reduced RVSF, PASP 42 mmHg  . Hyperplastic rectal polyp   . Moderate episode of recurrent major depressive disorder ([place]) 08/20/2016  . OSA on CPAP    Mild  . Pneumonia   . Tobacco  abuse    Remote    No Known Allergies  ROS General: Denies fever, chills, night sweats, changes in weight, changes in appetite HEENT: Denies headaches, ear pain, changes in vision, rhinorrhea, sore throat CV: Denies CP, palpitations, SOB, orthopnea Pulm: Denies SOB, cough, wheezing GI: Denies abdominal pain, nausea, vomiting, diarrhea, constipation GU: Denies dysuria, hematuria, frequency Msk: Denies muscle cramps, joint pains  +R knee pain, stiffness, instability Neuro: Denies weakness, numbness, tingling Skin: Denies rashes, bruising Psych: Denies depression, anxiety, hallucinations      Objective:    Blood pressure 100/70, pulse 60, temperature 98.8 F (37.1 C), temperature source Oral, weight 224 lb 6.4 oz (101.8 kg), SpO2 96 %.  Gen. Pleasant, well-nourished, in no distress, normal affect   HEENT: [place]/AT, face symmetric, conjunctiva clear, no scleral icterus, PERRLA, EOMI, nares patent without drainage Lungs: no accessory muscle use Cardiovascular: RRR, no peripheral edema Musculoskeletal: Limited ROM of right knee 2/2 pain.  TTP of midline right knee at patella and patellar tendon.  Right knee somewhat firm, no effusion with palpation of right knee unable to appreciate crepitus 2/2 lumbar range of motion.  No joint line tenderness of right knee or popliteal fossa fullness.   Left knee normal in appearance.  No TTP of left knee or crepitus with normal ROM.  No deformities, no cyanosis or clubbing, normal tone Neuro:  A&Ox3, CN II-XII intact, ambulating with crutches Skin:  Warm, no lesions/ rash.  Well-healed surgical incisions midline right knee.   Wt Readings from Last 3 Encounters:  09/11/20 224 lb 6.4 oz (101.8 kg)  08/23/20 228 lb (103.4 kg)  08/15/20 228 lb (103.4 kg)    Lab Results  Component Value Date   WBC 5.5 08/15/2020   HGB 11.9 (L) 08/15/2020   HCT 37.8 (L) 08/15/2020   PLT 167 08/15/2020   GLUCOSE 106 (H) 08/15/2020   CHOL 161 02/11/2020   TRIG 135  02/11/2020   HDL 46 02/11/2020   LDLDIRECT 166.4 05/17/2010   LDLCALC 92 02/11/2020   ALT 21 05/23/2020   AST 28 05/23/2020   NA 136 08/15/2020   K 4.1 08/15/2020   CL 99 08/15/2020   CREATININE 1.82 (H) 08/15/2020   BUN 24 (H) 08/15/2020   CO2 29 08/15/2020   TSH 2.17 11/25/2018   INR 1.14 06/09/2018   HGBA1C 6.1 (H) 08/15/2020   MICROALBUR 0.4 02/11/2020    Assessment/Plan:  Acute pain of right knee  -Concern for tendon rupture, hematoma as on Eliquis.  Also consider arthritis and atypical presentation of gout. -Discussed follow-up with Ortho given history of patellar tendon repair.  Unable to find records in chart review. -Stat referral to [facility] placed patient seen there for a previous concern. -Discussed supportive care -Pain medication not prescribed as patient has a prescription for oxycodone 5 mg immediate release tab sent in on 08/23/2020 -We will defer imaging as being seen by Ortho - Plan: Ambulatory referral to Orthopedic Surgery  Instability of right knee joint  - Plan: Ambulatory referral to Orthopedic Surgery  F/u prn  Grier Mitts, MD

## 2020-09-11 NOTE — Patient Instructions (Addendum)
You should receive a call from Bruce Robertson [phone] regarding an appointment for your knee.  Acute Knee Pain, Adult Acute knee pain is sudden and may be caused by damage, swelling, or irritation of the muscles and tissues that support your knee. The injury may result from:  A fall.  An injury to your knee from twisting motions.  A hit to the knee.  Infection. Acute knee pain may go away on its own with time and rest. If it does not, your health care provider may order tests to find the cause of the pain. These may include:  Imaging tests, such as an X-ray, MRI, or ultrasound.  Joint aspiration. In this test, fluid is removed from the knee.  Arthroscopy. In this test, a lighted tube is inserted into the knee and an image is projected onto a TV screen.  Biopsy. In this test, a sample of tissue is removed from the body and studied under a microscope. Follow these instructions at home: Pay attention to any changes in your symptoms. Take these actions to relieve your pain. If you have a knee sleeve or brace:   Wear the sleeve or brace as told by your health care provider. Remove it only as told by your health care provider.  Loosen the sleeve or brace if your toes tingle, become numb, or turn cold and blue.  Keep the sleeve or brace clean.  If the sleeve or brace is not waterproof: ? Do not let it get wet. ? Cover it with a watertight covering when you take a bath or shower. Activity  Rest your knee.  Do not do things that cause pain or make pain worse.  Avoid high-impact activities or exercises, such as running, jumping rope, or doing jumping jacks.  Work with a physical therapist to make a safe exercise program, as recommended by your health care provider. Do exercises as told by your physical therapist. Managing pain, stiffness, and swelling   If directed, put ice on the knee: ? Put ice in a plastic bag. ? Place a towel between your skin and the bag. ? Leave the  ice on for 20 minutes, 2-3 times a day.  If directed, use an elastic bandage to put pressure (compression) on your injured knee. This may control swelling, give support, and help with discomfort. General instructions  Take over-the-counter and prescription medicines only as told by your health care provider.  Raise (elevate) your knee above the level of your heart when you are sitting or lying down.  Sleep with a pillow under your knee.  Do not use any products that contain nicotine or tobacco, such as cigarettes, e-cigarettes, and chewing tobacco. These can delay healing. If you need help quitting, ask your health care provider.  If you are overweight, work with your health care provider and a dietitian to set a weight-loss goal that is healthy and reasonable for you. Extra weight can put pressure on your knee.  Keep all follow-up visits as told by your health care provider. This is important. Contact a health care provider if:  Your knee pain continues, changes, or gets worse.  You have a fever along with knee pain.  Your knee feels warm to the touch.  Your knee buckles or locks up. Get help right away if:  Your knee swells, and the swelling becomes worse.  You cannot move your knee.  You have severe pain in your knee. Summary  Acute knee pain can be caused by a  fall, an injury, an infection, or damage, swelling, or irritation of the tissues that support your knee.  Your health care provider may perform tests to find out the cause of the pain.  Pay attention to any changes in your symptoms. Relieve your pain with rest, medicines, light activity, and use of ice.  Get help if your pain continues or becomes worse, your knee swells, or you cannot move your knee. This information is not intended to replace advice given to you by your health care provider. Make sure you discuss any questions you have with your health care provider. Document Revised: 04/02/2018 Document Reviewed:  04/02/2018 Elsevier Patient Education  [place].

## 2020-09-13 ENCOUNTER — Ambulatory Visit (INDEPENDENT_AMBULATORY_CARE_PROVIDER_SITE_OTHER): Payer: Medicare HMO

## 2020-09-13 ENCOUNTER — Encounter: Payer: Self-pay | Admitting: Orthopaedic Surgery

## 2020-09-13 ENCOUNTER — Ambulatory Visit (INDEPENDENT_AMBULATORY_CARE_PROVIDER_SITE_OTHER): Payer: Medicare HMO | Admitting: Orthopaedic Surgery

## 2020-09-13 DIAGNOSIS — S86811A Strain of other muscle(s) and tendon(s) at lower leg level, right leg, initial encounter: Secondary | ICD-10-CM

## 2020-09-13 NOTE — Progress Notes (Signed)
Office Visit Note   Patient: Bruce Robertson           Date of Birth: July 01, 1961           MRN: [id number] Visit Date: 09/13/2020              Requested by: Billie Ruddy, MD [place],  [place] [id number] PCP: Caren Macadam, MD   Assessment & Plan: Visit Diagnoses:  1. Patellar tendon rupture, right, initial encounter     Plan: Impression is acute patellar tendon rupture.  Mechanism is odd and very low energy so my concern is that he has underlying patellar tendinosis that predisposed him to this injury.  Based on findings we will need to surgically repair the ruptured and may need to augment with allograft.  Patient is unable to obtain MRI due to presence of defibrillator.  I have instructed him to stop taking Eliquis beginning on Saturday.  Questions encouraged and answered.  Risk benefits rehab recovery reviewed.  Follow-Up Instructions: No follow-ups on file.   Orders:  Orders Placed This Encounter  Procedures  . XR KNEE 3 VIEW RIGHT   No orders of the defined types were placed in this encounter.     Procedures: No procedures performed   Clinical Data: No additional findings.   Subjective: Chief Complaint  Patient presents with  . Right Knee - Pain    Bruce Robertson is a 59 year old gentleman that I have seen previously for an unrelated issue comes in for acute right knee pain and injury.  He had a quadriceps tendon rupture about 8 to 10 years ago that was repaired.  He had no issues following the surgery.  This past week however he states that he got out of bed and stepped awkwardly and his knee and leg buckled underneath.  Since then he has had pain and swelling to the inferior portion of the knee and he is has been unable to straighten his leg and is unable to go upstairs.  He is currently walking with a single crutch.   Review of Systems  Constitutional: Negative.   All other systems reviewed and are negative.    Objective: Vital  Signs: There were no vitals taken for this visit.  Physical Exam Vitals and nursing note reviewed.  Constitutional:      Appearance: He is well-developed.  Pulmonary:     Effort: Pulmonary effort is normal.  Abdominal:     Palpations: Abdomen is soft.  Skin:    General: Skin is warm.  Neurological:     Mental Status: He is alert and oriented to person, place, and time.  Psychiatric:        Behavior: Behavior normal.        Thought Content: Thought content normal.        Judgment: Judgment normal.     Ortho Exam Right knee shows tenderness to palpation inferior to the patella.  Moderate swelling.  I am unable to reliably palpate the patella tendon due to the swelling.  He is unable to extend his knee against gravity.  Passive flexion of the knee creates pain.  No neurovascular compromise.  Specialty Comments:  No specialty comments available.  Imaging: XR KNEE 3 VIEW RIGHT  Result Date: 09/13/2020 Avulsion fragment medial and inferior to the patella concerning for patella tendon avulsion fracture.    PMFS History: Patient Active Problem List   Diagnosis Date Noted  . Patellar tendon rupture, right, initial encounter 09/13/2020  .  Restrictive lung disease 06/22/2020  . BRBPR (bright red blood per rectum) 05/24/2020  . Chronic systolic CHF (congestive heart failure) ([place]) 05/24/2020  . Acute blood loss anemia 05/24/2020  . Paroxysmal atrial fibrillation ([place]) 03/28/2020  . Secondary hypercoagulable state ([place]) 03/28/2020  . Educated about COVID-19 virus infection 02/10/2020  . S/P ICD (internal cardiac defibrillator) procedure 07/18/2019  . Nontraumatic complete tear of right rotator cuff 06/29/2019  . Pain in left elbow 06/29/2019  . Prediabetes 06/10/2018  . Thrombocytopenia ([place]) 06/10/2018  . AICD (automatic cardioverter/defibrillator) present 06/10/2018  . History of peptic ulcer disease 06/10/2018  . NSVT (nonsustained ventricular tachycardia) ([place])   .  Hypotension due to drugs 02/09/2018  . Difficulty controlling anger 12/31/2016  . Anxiety 08/20/2016  . Moderate episode of recurrent major depressive disorder ([place]) 08/20/2016  . Polyp of colon 04/29/2016  . Shortness of breath 03/05/2016  . Essential hypertension 10/31/2015  . Acute on chronic systolic heart failure ([place]) 08/29/2015  . Hx of CABG 08/29/2015  . Stage 3 chronic kidney disease ([place]) 08/29/2015  . Sleep apnea 06/10/2011  . Fatigue 06/10/2011  . Obesity (BMI 30-39.9) 05/02/2010  . Hemorrhoids 02/08/2010  . Esophageal reflux 02/08/2010  . Rectal bleeding 02/08/2010  . Hyperlipidemia 04/06/2009  . TOBACCO USER 04/06/2009  . Cardiomyopathy, dilated, nonischemic ([place]) 04/06/2009  . Non-ischemic cardiomyopathy ([place]) 04/06/2009   Past Medical History:  Diagnosis Date  . AICD (automatic cardioverter/defibrillator) present   . Anxiety 08/20/2016  . Cardiomyopathy    Nonischemic. EF has been about 45%. S/P CABG.;  b.  Echo 4/14: EF 25%, global HK with inf and mid apical AK, restrictive physiology with E/e' > 15 (elevated LV filling pressure), trivial AI/MR, mild to mod LAE, mild RVE, mild reduced RVSF, mild RAE, mod TR, PASP 74 (severe pulmonary HTN)   . CHF (congestive heart failure) ([place])   . CHF exacerbation ([place]) 07/31/2016  . Colon polyps 2002  . Coronary artery disease   . Diabetes mellitus without complication ([place])   . Dyslipidemia    hx (03/20/2017)  . GERD (gastroesophageal reflux disease)   . History of bleeding peptic ulcer   . History of echocardiogram    Echo 10/16:  EF 25-30%, poss non-compaction, diff HK with inf-lat and apical HK, restrictive physio, mild AI, severe LAE, mild RVE with mild reduced RVSF, PASP 42 mmHg  . Hyperplastic rectal polyp   . Moderate episode of recurrent major depressive disorder ([place]) 08/20/2016  . OSA on CPAP    Mild  . Pneumonia   . Tobacco abuse    Remote    Family History  Problem Relation Age of Onset  . Hypertension  Mother   . Osteoarthritis Mother   . Heart failure Father 46  . High blood pressure Father   . Lung cancer Other   . Cancer Maternal Grandmother        uncertain type  . Bipolar disorder Daughter   . Diabetes Daughter   . Cancer Maternal Uncle        uncertain type  . Diabetes Niece   . Asthma Neg Hx     Past Surgical History:  Procedure Laterality Date  . CARDIAC CATHETERIZATION  2007;  2009  . CARDIAC DEFIBRILLATOR PLACEMENT  03/20/2017  . COLONOSCOPY WITH PROPOFOL N/A 01/21/2019   Procedure: COLONOSCOPY WITH PROPOFOL;  Surgeon: Milus Banister, MD;  [facility];  Service: Endoscopy;  Laterality: N/A;  . CORONARY ARTERY BYPASS GRAFT  2007   Had a left  main dissection after catheterization. Underwent a saphenous vein graft to the LAD and a saphenous vein graft to obtuse marginal.  . HEMORRHOID SURGERY N/A 08/23/2020   Procedure: HEMORRHOIDECTOMY WITH LIGATION AND HEMORRHOIDOPEXY;  Surgeon: Michael Boston, MD;  Location: [facility];  Service: General;  Laterality: N/A;  . ICD IMPLANT N/A 03/20/2017   Procedure: ICD Implant;  Surgeon: Constance Haw, MD;  Location: [facility];  Service: Cardiovascular;  Laterality: N/A;  . PATELLAR TENDON REPAIR Right 2005  . POLYPECTOMY  01/21/2019   Procedure: POLYPECTOMY;  Surgeon: Milus Banister, MD;  Location: [facility];  Service: Endoscopy;;  . RECTAL EXAM UNDER ANESTHESIA Left 08/23/2020   Procedure: RECTAL EXAM UNDER ANESTHESIA;  Surgeon: Michael Boston, MD;  Location: [facility];  Service: General;  Laterality: Left;   Social History   Occupational History  . Occupation: Civil engineer, contracting    Employer: [employer]  Tobacco Use  . Smoking status: Former Smoker    Packs/day: 0.25    Years: 7.00    Pack years: 1.75  . Smokeless tobacco: Never Used  . Tobacco comment: 03/20/2017 "quit in 1997  Vaping Use  . Vaping Use: Former  . Devices: vaped for about a month  Substance and Sexual Activity  . Alcohol use: Not  Currently    Alcohol/week: 0.0 standard drinks    Comment: twice a month no preference  . Drug use: Yes    Types: Marijuana    Comment: 03/20/2017 "joint qod"  . Sexual activity: Not Currently

## 2020-09-14 ENCOUNTER — Other Ambulatory Visit: Payer: Self-pay

## 2020-09-14 ENCOUNTER — Other Ambulatory Visit (HOSPITAL_COMMUNITY)
Admission: RE | Admit: 2020-09-14 | Discharge: 2020-09-14 | Disposition: A | Discharge status: Home / Self Care | Payer: Medicare HMO | Source: Ambulatory Visit | Attending: Orthopaedic Surgery | Admitting: Orthopaedic Surgery

## 2020-09-14 DIAGNOSIS — Z20822 Contact with and (suspected) exposure to covid-19: Secondary | ICD-10-CM | POA: Diagnosis not present

## 2020-09-14 DIAGNOSIS — Z01812 Encounter for preprocedural laboratory examination: Secondary | ICD-10-CM | POA: Insufficient documentation

## 2020-09-14 LAB — SARS CORONAVIRUS 2 (TAT 6-24 HRS): SARS Coronavirus 2: NEGATIVE

## 2020-09-15 ENCOUNTER — Encounter: Payer: Self-pay | Admitting: Cardiology

## 2020-09-15 ENCOUNTER — Ambulatory Visit (HOSPITAL_COMMUNITY): Payer: Medicare HMO | Admitting: Vascular Surgery

## 2020-09-15 ENCOUNTER — Other Ambulatory Visit: Payer: Self-pay

## 2020-09-15 ENCOUNTER — Telehealth: Payer: Self-pay | Admitting: Orthopaedic Surgery

## 2020-09-15 ENCOUNTER — Encounter (HOSPITAL_COMMUNITY): Payer: Self-pay | Admitting: Orthopaedic Surgery

## 2020-09-15 NOTE — Telephone Encounter (Signed)
Please see message below. This pt is sch for a right patellar tendon repair 09/18/20

## 2020-09-15 NOTE — Progress Notes (Addendum)
Bruce Robertson denies chest pain or shortness of breath. Patient tested negative for Covid 09/14/20 and has been in quarantine since that time.  Bruce Robertson reported that he was told to stop all of his medications except Coreg and Omeprazole. Patient has not taken any other medications today. I explained to patient that he should stop Advil and follow instructions regarding Eliquis, but he should continue to take the others until the morning of surgery, with the exception of Jardiance, he should not take it Snday or Monday am.  I called the office and spoke with Sherri, Dr. Phoebe Sharps scheduler and shared the information regarding medication that Bruce Robertson heard.  Sherri read Dr. Miachel Roux notes, which said last dose of Eliquis 09/15/20. I asked if someone could call patient and reinforce the instructions that I gave patient, she said that someone would call Bruce Robertson.  Bruce Robertson has Type II diabetes, patient reports that CBG's low 100- 110.  I Robertson patient to not take Jardiance Sunday and Monday am. I Robertson patient to check CBG after awaking and every 2 hours until arrival  to the hospital.  I Robertson patient if CBG is less than 70 to drink 1/2 cup of a clear juice. Recheck CBG in 15 minutes then call pre- op desk at [phone] for further instructions. I asked Bruce Robertson to drink low sugar Gatorade, if he drinks Gatorade.

## 2020-09-15 NOTE — Telephone Encounter (Signed)
Pt called stating he needs a refill of his oxycodone rx however he isn't sure wether he should ask Dr. Haze Boyden (the Dr. Who originally prescribed it) or Dr. Erlinda Hong as the pt is having surgery with him on 09/18/20? Pt would like a CB to let him know who can fill this for him  [phone]

## 2020-09-15 NOTE — Anesthesia Preprocedure Evaluation (Deleted)
Anesthesia Evaluation  Patient identified by MRN, date of birth, ID band Patient awake    Reviewed: Allergy & Precautions, Patient's Chart, lab work & pertinent test results, reviewed documented beta blocker date and time   Airway        Dental   Pulmonary sleep apnea and Continuous Positive Airway Pressure Ventilation , former smoker,  Moderate pulmonary HTN OSA on BiPAP          Cardiovascular hypertension, Pt. on medications and Pt. on home beta blockers + CAD, + CABG (2007) and +CHF (LVEF 15%, pulmonary HTN)  + dysrhythmias (eliquis- LD 09/14/20) Atrial Fibrillation + Cardiac Defibrillator (2018) + Valvular Problems/Murmurs (mild MR, mild AI) MR and AI   Echo 02/2020: 1. Left ventricular ejection fraction, by estimation, is 15-20%. The left  ventricle has severely decreased function. The left ventricle demonstrates  global hypokinesis. The left ventricular internal cavity size was severely  dilated. Left ventricular  diastolic parameters are indeterminate. Elevated left ventricular  end-diastolic pressure.  2. Right ventricular systolic function is moderately reduced. The right  ventricular size is moderately enlarged. There is severely elevated  pulmonary artery systolic pressure. The estimated right ventricular  systolic pressure is 27.0 mmHg.  3. Left atrial size was moderately dilated.  4. Right atrial size was moderate-severely dilated.  5. The mitral valve is degenerative. Mild mitral valve regurgitation. No  evidence of mitral stenosis.  6. Tricuspid valve regurgitation is severe.  7. The aortic valve is abnormal. Aortic valve regurgitation is mild. No  aortic stenosis is present.  8. The inferior vena cava is dilated in size with <50% respiratory  variability, suggesting right atrial pressure of 15 mmHg.    Neuro/Psych PSYCHIATRIC DISORDERS Anxiety Depression negative neurological ROS     GI/Hepatic GERD   Medicated and Controlled,(+)     substance abuse  marijuana use,   Endo/Other  negative endocrine ROSdiabetes, Type 2  Renal/GU CRFRenal disease  negative genitourinary   Musculoskeletal negative musculoskeletal ROS (+)   Abdominal   Peds  Hematology  (+) Blood dyscrasia, anemia ,   Anesthesia Other Findings   Reproductive/Obstetrics negative OB ROS                          Anesthesia Physical Anesthesia Plan  ASA: IV  Anesthesia Plan: General   Post-op Pain Management:    Induction: Intravenous  PONV Risk Score and Plan: 2 and Midazolam, Ondansetron and Dexamethasone  Airway Management Planned: LMA  Additional Equipment: Arterial line  Intra-op Plan:   Post-operative Plan: Extubation in OR  Informed Consent: I have reviewed the patients History and Physical, chart, labs and discussed the procedure including the risks, benefits and alternatives for the proposed anesthesia with the patient or authorized representative who has indicated his/her understanding and acceptance.       Plan Discussed with: CRNA and Anesthesiologist  Anesthesia Plan Comments: (EP recs state have magnet available but magnet placement not needed. )    Anesthesia Quick Evaluation

## 2020-09-15 NOTE — Progress Notes (Signed)
[facility] DEVICE PROGRAMMING   Patient Information: Name: Bruce, Robertson  DOB: 04/20/1961  MRN: @   Planned Procedure: Right Patella Tendon Tear with possible autograft  Surgeon: Dr. Erlinda Hong  Date of Procedure: 09/18/20  Cautery will be used.  Position during surgery: supine   Please send documentation back to:  [facility] (Fax # [phone])   Hope Budds, RN  09/15/2020 11:44 AM       Device Information:   Clinic EP Physician:   Allegra Lai, MD Device Type:  Defibrillator Manufacturer and Phone #:  Medtronic: [phone] Pacemaker Dependent?:  No Date of Last Device Check:  08/31/2020        Normal Device Function?:  Yes     Electrophysiologist's Recommendations:    Have magnet available.  Provide continuous ECG monitoring when magnet is used or reprogramming is to be performed.   Procedure should not interfere with device function.  No device programming or magnet placement needed.  Per Device Clinic Standing Orders, Drake Leach  09/15/2020 12:19 PM

## 2020-09-15 NOTE — Progress Notes (Signed)
Anesthesia Chart Review: Bruce Robertson   Case: [id number] Date/Time: 09/18/20 1448   Procedure: RIGHT PATELLAR TENDON REPAIR POSSIBLE AUTOGRAFT (Right Knee) - requesting rnfa   Anesthesia type: General   Pre-op diagnosis: right patellar tendon rupture   Location: [facility] OR ROOM 04 / [facility]   Surgeons: Leandrew Koyanagi, MD      DISCUSSION: Patient is a 59 year old male scheduled for the above procedure. He was referred to ortho on 09/11/20 by Grier Mitts, MD following evaluation for acuate right knee pain with concern for tendon rupture or hematoma on Eliquis. Imaging showed avulsion fragment medial and inferior to the patella concerning for patella tendon avulsion fracture. Last Eliquis 09/14/20. (He is recently s/p hemorrhoidectomy and hemorrhoidopexy 08/23/20 and had clearance-- "intermediate" CV risk--from his [facility] HF cardiologist Dr. Posey Pronto for that procedure with permission to temporarily hold Eliquis and recommendation for perioperative BiPAP compliance given significant pulmonary hypertension on last echo in April).     History includes former smoker (1.75 pack years), OSA (on BiPAP), DM II, GERD, nonischemic cardiomyopathy (last EF 15-20% 02/2020; prior CABG x2: SVG-LAD & SVG-OM1 on 08/20/05 for iatrogenic LM dissection during diagnostic cath: normal coronaries, severe NICM), Medtronic AICD (03/20/17), PAF (on Eliquis), dyslipidemia, obesity.  ICD Perioperative Device RX: Device Information: Clinic EP Physician:Will Curt Bears, MD Device Type:Defibrillator Manufacturer and Phone #:Medtronic: [phone] Pacemaker Dependent?:No Date of Last Device Check:10/28/2021Normal Device Function?:Yes  Electrophysiologist's Recommendations:  Have magnet available.  Provide continuous ECG monitoring when magnet is used or reprogramming is to be performed.  Procedure should not interfere with device function.  No device programming or magnet placement needed.   09/14/2020  presurgical COVID-19 test negative.  He is a same-day work-up, so anesthesia team to evaluate on the day of surgery.   VS:   Wt Readings from Last 3 Encounters:  09/11/20 101.8 kg  08/23/20 103.4 kg  08/15/20 103.4 kg   BP Readings from Last 3 Encounters:  09/11/20 100/70  08/23/20 108/70  08/15/20 (!) 88/55   Pulse Readings from Last 3 Encounters:  09/11/20 60  08/23/20 64  08/15/20 (!) 56    PROVIDERS: Caren Macadam, MD is PCP   Lattie Haw, MD is HF Cardiologist ([place]). He was referred by Dr. [name] for consideration of advanced HF therapies. Medical therapy titrated in May with improvement in symptoms at 06/29/20 follow-up visit. Follow-up in ~ 3 months planned (scheduled for 09/21/20).   Minus Breeding, MD is cardiologist with [facility]  Curt Bears, Will, MD is EP cardiologist   LABS: Last lab results include: Lab Results  Component Value Date   WBC 5.5 08/15/2020   HGB 11.9 (L) 08/15/2020   HCT 37.8 (L) 08/15/2020   PLT 167 08/15/2020   GLUCOSE 106 (H) 08/15/2020   ALT 21 05/23/2020   AST 28 05/23/2020   NA 136 08/15/2020   K 4.1 08/15/2020   CL 99 08/15/2020   CREATININE 1.82 (H) 08/15/2020   BUN 24 (H) 08/15/2020   CO2 29 08/15/2020   HGBA1C 6.1 (H) 08/15/2020     IMAGES: CT Chest 02/29/20: IMPRESSION: 1. Status post coronary bypass graft. 2. Small sliding-type hiatal hernia. 3. Stable 7 mm right upper lobe nodule is noted. Follow-up unenhanced chest CT in 12 months is recommended to ensure stability.   EKG: 05/24/20 Rate 51 bpm  Sinus arrhythmia Left posterior fascicular block Low voltage with right axis deviation No significant change was found Confirmed by Ezequiel Essex [phone]) on 05/24/2020 12:53:20 AM  CV: CPX 03/21/20: Conclusion: Exercise testing with gas exchange demonstrates mild functional impairment when compared to matched sedentary norms. Patient appears with primary mild HF limitation with  elevated VE/VCO2 slope, predicting worsening short-term prognosis. There is a mild restrictive pulmonary component contributing to symptoms, and this appears related to patient's body habitus. There was chronotropic incompetence. PVO2 slightly improved from 2017, however, VE/VCO2 slope significantly worsened.  - Test, report and preliminary impression by:  Landis Martins, MS, ACSM-RCEP  03/21/2020 1:12 PM  - Agree with above. There is Mild functional limitation that is clearly multifactorial related to HF, obesity with related restrictive lung physiology and mild chronotropic incompetence. The markedly elevated VE/VCO2 slope suggests a significant HF component.  Glori Bickers, MD  11:10 AM    Echo 02/04/2020 IMPRESSIONS  1. Left ventricular ejection fraction, by estimation, is 15-20%. The left  ventricle has severely decreased function. The left ventricle demonstrates  global hypokinesis. The left ventricular internal cavity size was severely  dilated. Left ventricular  diastolic parameters are indeterminate. Elevated left ventricular  end-diastolic pressure.  2. Right ventricular systolic function is moderately reduced. The right  ventricular size is moderately enlarged. There is severely elevated  pulmonary artery systolic pressure. The estimated right ventricular  systolic pressure is 50.9 mmHg.  3. Left atrial size was moderately dilated.  4. Right atrial size was moderate-severely dilated.  5. The mitral valve is degenerative. Mild mitral valve regurgitation. No  evidence of mitral stenosis.  6. Tricuspid valve regurgitation is severe.  7. The aortic valve is abnormal. Aortic valve regurgitation is mild. No  aortic stenosis is present.  8. The inferior vena cava is dilated in size with <50% respiratory  variability, suggesting right atrial pressure of 15 mmHg. (Comparison 04/01/18: LVEF 20-25%, diffuse hypokinesis, grade 2 DD, mild AR, moderate TR, PA peak pressure 44  mmHg)   LHC 11/21/06: CONCLUSION:  1. No obstructive coronary disease.  2. I do not see residual evidence of the left main dissection.  It      seems to have healed.  3. He has excellent native vessel flow.  4. He also has excellent saphenous vein graft flow.  5. He has a mildly reduced ejection fraction. Very mild global hypokinesis with EF ~ 50%.   RHC/LHC 08/21/15:  ASSESSMENT:  1.  Normal coronary arteries.  2.  Severe nonischemic cardiomyopathy, question of alcohol related      cardiomyopathy.  3.  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 - S/p Emergency CABG: SVG-LAD, SVG-OM1 08/20/05, [place] Charon, MD  Past Medical History:  Diagnosis Date  . AICD (automatic cardioverter/defibrillator) present   . Anxiety 08/20/2016  . Cardiomyopathy    Nonischemic. EF has been about 45%. S/P CABG.;  b.  Echo 4/14: EF 25%, global HK with inf and mid apical AK, restrictive physiology with E/e' > 15 (elevated LV filling pressure), trivial AI/MR, mild to mod LAE, mild RVE, mild reduced RVSF, mild RAE, mod TR, PASP 74 (severe pulmonary HTN)   . CHF (congestive heart failure) ([place])   . CHF exacerbation ([place]) 07/31/2016  . Colon polyps 2002  . Coronary artery disease   . Diabetes mellitus without complication ([place])    Type II  . Dyslipidemia    hx (03/20/2017)  . GERD (gastroesophageal reflux disease)   . History of bleeding peptic ulcer   . History of echocardiogram    Echo 10/16:  EF 25-30%, poss non-compaction,  diff HK with inf-lat and apical HK, restrictive physio, mild AI, severe LAE, mild RVE with mild reduced RVSF, PASP 42 mmHg  . Hyperplastic rectal polyp   . Moderate episode of recurrent major depressive disorder ([place]) 08/20/2016   09/14/20 - not current  . OSA on CPAP    Mild  . Pneumonia 09/2018  . Tobacco abuse    Remote    Past Surgical History:  Procedure Laterality Date  . CARDIAC CATHETERIZATION  2007;   2009  . CARDIAC DEFIBRILLATOR PLACEMENT  03/20/2017  . COLONOSCOPY WITH PROPOFOL N/A 01/21/2019   Procedure: COLONOSCOPY WITH PROPOFOL;  Surgeon: Milus Banister, MD;  [facility];  Service: Endoscopy;  Laterality: N/A;  . CORONARY ARTERY BYPASS GRAFT  2007   Had a left main dissection after catheterization. Underwent a saphenous vein graft to the LAD and a saphenous vein graft to obtuse marginal.  . HEMORRHOID SURGERY N/A 08/23/2020   Procedure: HEMORRHOIDECTOMY WITH LIGATION AND HEMORRHOIDOPEXY;  Surgeon: Michael Boston, MD;  [facility];  Service: General;  Laterality: N/A;  . ICD IMPLANT N/A 03/20/2017   Procedure: ICD Implant;  Surgeon: Constance Haw, MD;  Location: [facility];  Service: Cardiovascular;  Laterality: N/A;  . PATELLAR TENDON REPAIR Right 2005  . POLYPECTOMY  01/21/2019   Procedure: POLYPECTOMY;  Surgeon: Milus Banister, MD;  Location: [facility];  Service: Endoscopy;;  . RECTAL EXAM UNDER ANESTHESIA Left 08/23/2020   Procedure: RECTAL EXAM UNDER ANESTHESIA;  Surgeon: Michael Boston, MD;  [facility];  Service: General;  Laterality: Left;    MEDICATIONS: No current facility-administered medications for this encounter.   Marland Kitchen ibuprofen (ADVIL) 200 MG tablet  . ACCU-CHEK GUIDE test strip  . Accu-Chek Softclix Lancets lancets  . blood glucose meter kit and supplies KIT  . carvedilol (COREG) 12.5 MG tablet  . diazepam (VALIUM) 5 MG tablet  . ELIQUIS 5 MG TABS tablet  . JARDIANCE 10 MG TABS tablet  . Multiple Vitamin (MULTIVITAMIN WITH MINERALS) TABS tablet  . omeprazole (PRILOSEC) 40 MG capsule  . oxyCODONE (OXY IR/ROXICODONE) 5 MG immediate release tablet  . sacubitril-valsartan (ENTRESTO) 24-26 MG  . simvastatin (ZOCOR) 40 MG tablet  . spironolactone (ALDACTONE) 25 MG tablet  . torsemide (DEMADEX) 20 MG tablet    Myra Gianotti, PA-C Surgical Short Stay/Anesthesiology [facility] Phone [phone] [facility] Phone [phone] 09/15/2020 1:37 PM

## 2020-09-18 ENCOUNTER — Encounter (HOSPITAL_COMMUNITY): Payer: Self-pay | Admitting: Orthopaedic Surgery

## 2020-09-18 ENCOUNTER — Ambulatory Visit (HOSPITAL_COMMUNITY)
Admission: RE | Admit: 2020-09-18 | Discharge: 2020-09-18 | Disposition: A | Discharge status: Home / Self Care | Payer: Medicare HMO | Attending: Orthopaedic Surgery | Admitting: Orthopaedic Surgery

## 2020-09-18 ENCOUNTER — Encounter (HOSPITAL_COMMUNITY)
Admission: RE | Disposition: A | Discharge status: Home / Self Care | Payer: Self-pay | Source: Home / Self Care | Attending: Orthopaedic Surgery

## 2020-09-18 DIAGNOSIS — S86111A Strain of other muscle(s) and tendon(s) of posterior muscle group at lower leg level, right leg, initial encounter: Secondary | ICD-10-CM | POA: Diagnosis not present

## 2020-09-18 DIAGNOSIS — Z01818 Encounter for other preprocedural examination: Secondary | ICD-10-CM | POA: Diagnosis not present

## 2020-09-18 LAB — COMPREHENSIVE METABOLIC PANEL
ALT: 24 U/L (ref 0–44)
AST: 29 U/L (ref 15–41)
Albumin: 3.7 g/dL (ref 3.5–5.0)
Alkaline Phosphatase: 51 U/L (ref 38–126)
Anion gap: 14 (ref 5–15)
BUN: 18 mg/dL (ref 6–20)
CO2: 25 mmol/L (ref 22–32)
Calcium: 9.3 mg/dL (ref 8.9–10.3)
Chloride: 96 mmol/L — ABNORMAL LOW (ref 98–111)
Creatinine, Ser: 1.71 mg/dL — ABNORMAL HIGH (ref 0.61–1.24)
GFR, Estimated: 46 mL/min — ABNORMAL LOW (ref >60)
Glucose, Bld: 95 mg/dL (ref 70–99)
Potassium: 3.5 mmol/L (ref 3.5–5.1)
Sodium: 135 mmol/L (ref 135–145)
Total Bilirubin: 0.7 mg/dL (ref 0.3–1.2)
Total Protein: 6.9 g/dL (ref 6.5–8.1)

## 2020-09-18 LAB — GLUCOSE, CAPILLARY: Glucose-Capillary: 100 mg/dL — ABNORMAL HIGH (ref 70–99)

## 2020-09-18 SURGERY — REPAIR, TENDON, PATELLAR
Anesthesia: General | Site: Knee | Laterality: Right

## 2020-09-18 MED ORDER — FENTANYL CITRATE (PF) 250 MCG/5ML IJ SOLN
INTRAMUSCULAR | Status: AC
  Filled 2020-09-18: qty 5

## 2020-09-18 MED ORDER — LACTATED RINGERS IV SOLN: INTRAVENOUS | Status: DC

## 2020-09-18 MED ORDER — CEFAZOLIN SODIUM-DEXTROSE 2-4 GM/100ML-% IV SOLN
2.0000 g | INTRAVENOUS | Status: DC
  Filled 2020-09-18: qty 100

## 2020-09-18 MED ORDER — ORAL CARE MOUTH RINSE: 15.0000 mL | Freq: Once | OROMUCOSAL | Status: DC

## 2020-09-18 MED ORDER — CHLORHEXIDINE GLUCONATE 0.12 % MT SOLN
15.0000 mL | Freq: Once | OROMUCOSAL | Status: DC
  Filled 2020-09-18: qty 15

## 2020-09-18 MED ORDER — MIDAZOLAM HCL 2 MG/2ML IJ SOLN
INTRAMUSCULAR | Status: AC
  Filled 2020-09-18: qty 2

## 2020-09-18 NOTE — Progress Notes (Signed)
Patient reported misunderstanding instructions and ate an apple at 1030. Notified Dr. Elgie Congo. Surgery delay until 1830. Per Dr. Erlinda Hong he is unable to operate on patient and he will reschedule surgery for Wednesday or Friday. Will give patient instructions, get rescheduled for COVID test, and run lab work. Patient updated.

## 2020-09-18 NOTE — Progress Notes (Addendum)
Patient given blood thinner instructions by Dr. Erlinda Hong. COVID test rescheduled. Will be a SDW as prior to discharge surgery was not rescheduled. Requested new orders from Dr. Erlinda Hong. Patient left using crutches. Offered wheelchair multiple times and patient refused.

## 2020-09-18 NOTE — H&P (Signed)
PREOPERATIVE H&P  Chief Complaint: right patellar tendon rupture  HPI: Bruce Robertson is a 59 y.o. male who presents for surgical treatment of right patellar tendon rupture.  He denies any changes in medical history.  Past Medical History:  Diagnosis Date  . AICD (automatic cardioverter/defibrillator) present   . Anxiety 08/20/2016  . Cardiomyopathy    Nonischemic. EF has been about 45%. S/P CABG.;  b.  Echo 4/14: EF 25%, global HK with inf and mid apical AK, restrictive physiology with E/e' > 15 (elevated LV filling pressure), trivial AI/MR, mild to mod LAE, mild RVE, mild reduced RVSF, mild RAE, mod TR, PASP 74 (severe pulmonary HTN)   . CHF (congestive heart failure) ([place])   . CHF exacerbation ([place]) 07/31/2016  . Colon polyps 2002  . Coronary artery disease   . Diabetes mellitus without complication ([place])    Type II  . Dyslipidemia    hx (03/20/2017)  . GERD (gastroesophageal reflux disease)   . History of bleeding peptic ulcer   . History of echocardiogram    Echo 10/16:  EF 25-30%, poss non-compaction, diff HK with inf-lat and apical HK, restrictive physio, mild AI, severe LAE, mild RVE with mild reduced RVSF, PASP 42 mmHg  . Hyperplastic rectal polyp   . Moderate episode of recurrent major depressive disorder ([place]) 08/20/2016   09/14/20 - not current  . OSA on CPAP    Mild  . Pneumonia 09/2018  . Tobacco abuse    Remote   Past Surgical History:  Procedure Laterality Date  . CARDIAC CATHETERIZATION  2007;  2009  . CARDIAC DEFIBRILLATOR PLACEMENT  03/20/2017  . COLONOSCOPY WITH PROPOFOL N/A 01/21/2019   Procedure: COLONOSCOPY WITH PROPOFOL;  Surgeon: Milus Banister, MD;  Location: [facility];  Service: Endoscopy;  Laterality: N/A;  . CORONARY ARTERY BYPASS GRAFT  2007   Had a left main dissection after catheterization. Underwent a saphenous vein graft to the LAD and a saphenous vein graft to obtuse marginal.  . HEMORRHOID SURGERY N/A 08/23/2020   Procedure:  HEMORRHOIDECTOMY WITH LIGATION AND HEMORRHOIDOPEXY;  Surgeon: Michael Boston, MD;  Location: [facility];  Service: General;  Laterality: N/A;  . ICD IMPLANT N/A 03/20/2017   Procedure: ICD Implant;  Surgeon: Constance Haw, MD;  Location: [facility];  Service: Cardiovascular;  Laterality: N/A;  . PATELLAR TENDON REPAIR Right 2005  . POLYPECTOMY  01/21/2019   Procedure: POLYPECTOMY;  Surgeon: Milus Banister, MD;  Location: [facility];  Service: Endoscopy;;  . RECTAL EXAM UNDER ANESTHESIA Left 08/23/2020   Procedure: RECTAL EXAM UNDER ANESTHESIA;  Surgeon: Michael Boston, MD;  Location: [facility];  Service: General;  Laterality: Left;   Social History   Socioeconomic History  . Marital status: Divorced    Spouse name: Not on file  . Number of children: 5  . Years of education: Not on file  . Highest education level: Not on file  Occupational History  . Occupation: Civil engineer, contracting    Employer: [employer]  Tobacco Use  . Smoking status: Former Smoker    Packs/day: 0.25    Years: 7.00    Pack years: 1.75  . Smokeless tobacco: Never Used  . Tobacco comment: 03/20/2017 "quit in 1997  Vaping Use  . Vaping Use: Former  . Devices: vaped for about a month  Substance and Sexual Activity  . Alcohol use: Not Currently    Alcohol/week: 0.0 standard drinks    Comment: twice a month no preference  .  Drug use: Yes    Frequency: 1.0 times per week    Types: Marijuana    Comment: last time 09/15/20  . Sexual activity: Not Currently  Other Topics Concern  . Not on file  Social History Narrative   Single   Social Determinants of Health   Financial Resource Strain:   . Difficulty of Paying Living Expenses: Not on file  Food Insecurity:   . Worried About Charity fundraiser in the Last Year: Not on file  . Ran Out of Food in the Last Year: Not on file  Transportation Needs: No Transportation Needs  . Lack of Transportation (Medical): No  . Lack of Transportation (Non-Medical):  No  Physical Activity: Sufficiently Active  . Days of Exercise per Week: 4 days  . Minutes of Exercise per Session: 40 min  Stress:   . Feeling of Stress : Not on file  Social Connections:   . Frequency of Communication with Friends and Family: Not on file  . Frequency of Social Gatherings with Friends and Family: Not on file  . Attends Religious Services: Not on file  . Active Member of Clubs or Organizations: Not on file  . Attends Archivist Meetings: Not on file  . Marital Status: Not on file   Family History  Problem Relation Age of Onset  . Hypertension Mother   . Osteoarthritis Mother   . Heart failure Father 29  . High blood pressure Father   . Lung cancer Other   . Cancer Maternal Grandmother        uncertain type  . Bipolar disorder Daughter   . Diabetes Daughter   . Cancer Maternal Uncle        uncertain type  . Diabetes Niece   . Asthma Neg Hx    No Known Allergies Prior to Admission medications   Medication Sig Start Date End Date Taking? Authorizing Provider  ibuprofen (ADVIL) 200 MG tablet Take 400 mg by mouth every 6 (six) hours as needed.   Yes [provider]  ACCU-CHEK GUIDE test strip  02/15/20   [provider]  Accu-Chek Softclix Lancets lancets  02/15/20   [provider]  blood glucose meter kit and supplies KIT Dispense based on patient and insurance preference. Use up to four times daily as directed. 02/14/20   Caren Macadam, MD  carvedilol (COREG) 12.5 MG tablet Take 1 tablet (12.5 mg total) by mouth 2 (two) times daily. 11/26/19   Camnitz, Ocie Doyne, MD  diazepam (VALIUM) 5 MG tablet Take 1 tablet (5 mg total) by mouth every 6 (six) hours as needed for muscle spasms (difficulty urinating). Patient taking differently: Take 5 mg by mouth every 6 (six) hours as needed for muscle spasms (difficulty urinating). Does not have anymore. 08/23/20   Michael Boston, MD  ELIQUIS 5 MG TABS tablet Take 1 tablet by mouth  twice daily 08/31/20   Camnitz, Ocie Doyne, MD  JARDIANCE 10 MG TABS tablet Take 10 mg by mouth daily.  06/29/20   [provider]  Multiple Vitamin (MULTIVITAMIN WITH MINERALS) TABS tablet Take 1 tablet by mouth daily.    [provider]  omeprazole (PRILOSEC) 40 MG capsule Take 40 mg by mouth daily before breakfast.    [provider]  oxyCODONE (OXY IR/ROXICODONE) 5 MG immediate release tablet Take 1-2 tablets (5-10 mg total) by mouth every 6 (six) hours as needed for moderate pain, severe pain or breakthrough pain. Patient taking differently: Take  5-10 mg by mouth every 6 (six) hours as needed for moderate pain, severe pain or breakthrough pain. Does not have any more. 08/23/20   Michael Boston, MD  sacubitril-valsartan (ENTRESTO) 24-26 MG Take 1 tablet by mouth in the morning and at bedtime.  03/30/20   [provider]  simvastatin (ZOCOR) 40 MG tablet Take 40 mg by mouth at bedtime.  05/08/20   [provider]  spironolactone (ALDACTONE) 25 MG tablet Take 25 mg by mouth daily.  03/30/20   [provider]  torsemide (DEMADEX) 20 MG tablet Take 2 tablets (40 mg total) by mouth daily. 10/11/19 08/03/21  Minus Breeding, MD     Positive ROS: All other systems have been reviewed and were otherwise negative with the exception of those mentioned in the HPI and as above.  Physical Exam: General: Alert, no acute distress Cardiovascular: No pedal edema Respiratory: No cyanosis, no use of accessory musculature GI: abdomen soft Skin: No lesions in the area of chief complaint Neurologic: Sensation intact distally Psychiatric: Patient is competent for consent with normal mood and affect Lymphatic: no lymphedema  MUSCULOSKELETAL: exam stable  Assessment: right patellar tendon rupture  Plan: Plan for Procedure(s): RIGHT PATELLAR TENDON REPAIR POSSIBLE AUTOGRAFT  The risks benefits and alternatives were discussed with the patient including but not  limited to the risks of nonoperative treatment, versus surgical intervention including infection, bleeding, nerve injury,  blood clots, cardiopulmonary complications, morbidity, mortality, among others, and they were willing to proceed.   Preoperative templating of the joint replacement has been completed, documented, and submitted to the Operating Room personnel in order to optimize intra-operative equipment management.   Eduard Roux, MD 09/18/2020 6:16 AM

## 2020-09-18 NOTE — Telephone Encounter (Signed)
Will send in post-op

## 2020-09-19 ENCOUNTER — Other Ambulatory Visit (HOSPITAL_COMMUNITY)
Admit: 2020-09-19 | Discharge: 2020-09-19 | Disposition: A | Discharge status: Home / Self Care | Payer: Medicare HMO | Attending: Orthopaedic Surgery | Admitting: Orthopaedic Surgery

## 2020-09-19 DIAGNOSIS — Z01812 Encounter for preprocedural laboratory examination: Secondary | ICD-10-CM | POA: Diagnosis not present

## 2020-09-19 DIAGNOSIS — Z20822 Contact with and (suspected) exposure to covid-19: Secondary | ICD-10-CM | POA: Diagnosis not present

## 2020-09-19 LAB — SARS CORONAVIRUS 2 (TAT 6-24 HRS): SARS Coronavirus 2: NEGATIVE

## 2020-09-20 ENCOUNTER — Encounter (HOSPITAL_COMMUNITY): Payer: Self-pay | Admitting: Orthopaedic Surgery

## 2020-09-20 ENCOUNTER — Encounter: Payer: Self-pay | Admitting: Cardiology

## 2020-09-20 NOTE — Progress Notes (Unsigned)
[facility] DEVICE PROGRAMMING  Patient Information: Name:  Bruce Robertson  DOB:  06-04-1961  MRN:  [id number]  {TIP - You do not have to delete this tip  -  Copy the info from the staff message sent by the PAT staff  then press F2 here and paste the information using CTL - V on the next line :[id number]}  Patient: Bruce Robertson  DOB: 10/14/1961   Planned Procedure: Patella Tendon Repair   Surgeon: Dr. Frankey Shown  Date of Procedure: 09/21/20  Cautery will be used.  Position during surgery:    Please send documentation back to:  [facility] (Fax # [phone])   Device Information:  Clinic EP Physician:  Allegra Lai, MD   Device Type:  Defibrillator Manufacturer and Phone #:  Medtronic: [phone] Pacemaker Dependent?:  No. Date of Last Device Check:  09/19/20 Normal Device Function?:  Yes.    Electrophysiologist's Recommendations:   Have magnet available.  Provide continuous ECG monitoring when magnet is used or reprogramming is to be performed.   Procedure should not interfere with device function.  No device programming or magnet placement needed.  Per Device Clinic Standing Orders, Simone Curia, RN  2:34 PM 09/20/2020

## 2020-09-20 NOTE — Progress Notes (Signed)
PCP:  Micheline Rough, MD Cardiologist:  Will Camnitz   EKG:  05/24/20 CXR:  02/11/20 ECHO:  04/01/18 Stress Test: 09/04/16 Cardiac Cath:  2007 and 2009  Fasting Blood Sugar-  100-110 Checks Blood Sugar_2__ times a day  ASA: NO Blood Thinner:  Yes, last dose Eliquis 09/18/20  OSA:  Yes CPAP:  Yes  Covid test 11/16 negative  Anesthesia Review:  Yes.  CHF, CAD (CABG), cardiomyopathy, ICD (Medtronic). Device check 09/19/20.  Device orders sent to [employer] and email to [employer], Therapist, sports.  Patient denies shortness of breath, fever, cough, and chest pain at PAT appointment.  Patient verbalized understanding of instructions provided today at the PAT appointment.  Patient asked to review instructions at home and day of surgery.

## 2020-09-20 NOTE — Anesthesia Preprocedure Evaluation (Addendum)
Anesthesia Evaluation  Patient identified by MRN, date of birth, ID band Patient awake    Reviewed: Allergy & Precautions, H&P , NPO status , Patient's Chart, lab work & pertinent test results  Airway Mallampati: II  TM Distance: >3 FB Neck ROM: Full    Dental no notable dental hx. (+) Teeth Intact, Dental Advisory Given   Pulmonary sleep apnea , former smoker,    Pulmonary exam normal breath sounds clear to auscultation       Cardiovascular hypertension, Pt. on medications and Pt. on home beta blockers + CAD and +CHF  + Cardiac Defibrillator  Rhythm:Regular Rate:Normal     Neuro/Psych Anxiety Depression negative neurological ROS     GI/Hepatic Neg liver ROS, GERD  Medicated,  Endo/Other  diabetes, Type 2, Oral Hypoglycemic Agents  Renal/GU negative Renal ROS  negative genitourinary   Musculoskeletal   Abdominal   Peds  Hematology  (+) Blood dyscrasia, anemia ,   Anesthesia Other Findings   Reproductive/Obstetrics negative OB ROS                            Anesthesia Physical Anesthesia Plan  ASA: IV  Anesthesia Plan: General   Post-op Pain Management:  Regional for Post-op pain   Induction: Intravenous  PONV Risk Score and Plan: 3 and Ondansetron, Dexamethasone and Midazolam  Airway Management Planned: Oral ETT  Additional Equipment:   Intra-op Plan:   Post-operative Plan: Extubation in OR  Informed Consent: I have reviewed the patients History and Physical, chart, labs and discussed the procedure including the risks, benefits and alternatives for the proposed anesthesia with the patient or authorized representative who has indicated his/her understanding and acceptance.     Dental advisory given  Plan Discussed with: CRNA  Anesthesia Plan Comments: (PAT note written by Myra Gianotti, PA-C. )       Anesthesia Quick Evaluation

## 2020-09-20 NOTE — Progress Notes (Signed)
Anesthesia Follow-up: Patient was scheduled for right patellar tendon repair on 09/18/20, but patient misunderstood instructions and ate an apple so surgery rescheduled for 09/21/20.   See my anesthesia APP note signed on 09/15/20.   Last Vitals include:  Wt Readings from Last 3 Encounters:  09/18/20 98.4 kg  09/11/20 101.8 kg  08/23/20 103.4 kg   BP Readings from Last 3 Encounters:  09/18/20 101/64  09/11/20 100/70  08/23/20 108/70   Pulse Readings from Last 3 Encounters:  09/18/20 68  09/11/20 60  08/23/20 64    Last lab results include:  Lab Results  Component Value Date   WBC 5.5 08/15/2020   HGB 11.9 (L) 08/15/2020   HCT 37.8 (L) 08/15/2020   PLT 167 08/15/2020   GLUCOSE 95 09/18/2020   ALT 24 09/18/2020   AST 29 09/18/2020   NA 135 09/18/2020   K 3.5 09/18/2020   CL 96 (L) 09/18/2020   CREATININE 1.71 (H) 09/18/2020   BUN 18 09/18/2020   CO2 25 09/18/2020   HGBA1C 6.1 (H) 08/15/2020   Creatinine 1.71, down from 1.82. Except for a single Cr of 1.17 on 05/24/20, his Cr has ranged from 1.53-1.98 since 11/29/19 (based on labs in [facility]).   Preoperative COVID-19 test negative on 09/19/2020.  Perioperative cardiac device form recommendations: Device Information:  Clinic EP Physician:  Allegra Lai, MD   Device Type:  Defibrillator Manufacturer and Phone #:  Medtronic: [id number] Pacemaker Dependent?:  No. Date of Last Device Check:  09/19/20         Normal Device Function?:  Yes.    Electrophysiologist's Recommendations:   Have magnet available.  Provide continuous ECG monitoring when magnet is used or reprogramming is to be performed.   Procedure should not interfere with device function.  No device programming or magnet placement needed.   Anesthesia team to evaluate on the day of surgery.  Myra Gianotti, PA-C Surgical Short Stay/Anesthesiology [facility] Phone [phone] [facility] Phone [phone] 09/20/2020 3:17 PM

## 2020-09-21 ENCOUNTER — Other Ambulatory Visit: Payer: Self-pay

## 2020-09-21 ENCOUNTER — Ambulatory Visit (HOSPITAL_COMMUNITY): Payer: Medicare HMO | Admitting: Physician Assistant

## 2020-09-21 ENCOUNTER — Encounter (HOSPITAL_COMMUNITY)
Admission: RE | Disposition: A | Discharge status: Home / Self Care | Payer: Self-pay | Source: Home / Self Care | Attending: Orthopaedic Surgery

## 2020-09-21 ENCOUNTER — Ambulatory Visit (HOSPITAL_COMMUNITY)
Admission: RE | Admit: 2020-09-21 | Discharge: 2020-09-21 | Disposition: A | Discharge status: Home / Self Care | Payer: Medicare HMO | Attending: Orthopaedic Surgery | Admitting: Orthopaedic Surgery

## 2020-09-21 ENCOUNTER — Encounter (HOSPITAL_COMMUNITY): Payer: Self-pay | Admitting: Orthopaedic Surgery

## 2020-09-21 DIAGNOSIS — Z95810 Presence of automatic (implantable) cardiac defibrillator: Secondary | ICD-10-CM | POA: Diagnosis not present

## 2020-09-21 DIAGNOSIS — Z79899 Other long term (current) drug therapy: Secondary | ICD-10-CM | POA: Diagnosis not present

## 2020-09-21 DIAGNOSIS — Z833 Family history of diabetes mellitus: Secondary | ICD-10-CM | POA: Insufficient documentation

## 2020-09-21 DIAGNOSIS — E119 Type 2 diabetes mellitus without complications: Secondary | ICD-10-CM | POA: Insufficient documentation

## 2020-09-21 DIAGNOSIS — Z818 Family history of other mental and behavioral disorders: Secondary | ICD-10-CM | POA: Diagnosis not present

## 2020-09-21 DIAGNOSIS — I110 Hypertensive heart disease with heart failure: Secondary | ICD-10-CM | POA: Diagnosis not present

## 2020-09-21 DIAGNOSIS — Z791 Long term (current) use of non-steroidal anti-inflammatories (NSAID): Secondary | ICD-10-CM | POA: Diagnosis not present

## 2020-09-21 DIAGNOSIS — X58XXXA Exposure to other specified factors, initial encounter: Secondary | ICD-10-CM | POA: Diagnosis not present

## 2020-09-21 DIAGNOSIS — Y939 Activity, unspecified: Secondary | ICD-10-CM | POA: Diagnosis not present

## 2020-09-21 DIAGNOSIS — Z801 Family history of malignant neoplasm of trachea, bronchus and lung: Secondary | ICD-10-CM | POA: Insufficient documentation

## 2020-09-21 DIAGNOSIS — I2510 Atherosclerotic heart disease of native coronary artery without angina pectoris: Secondary | ICD-10-CM | POA: Diagnosis not present

## 2020-09-21 DIAGNOSIS — G4733 Obstructive sleep apnea (adult) (pediatric): Secondary | ICD-10-CM | POA: Diagnosis not present

## 2020-09-21 DIAGNOSIS — G8918 Other acute postprocedural pain: Secondary | ICD-10-CM | POA: Diagnosis not present

## 2020-09-21 DIAGNOSIS — I509 Heart failure, unspecified: Secondary | ICD-10-CM | POA: Diagnosis not present

## 2020-09-21 DIAGNOSIS — S76111A Strain of right quadriceps muscle, fascia and tendon, initial encounter: Secondary | ICD-10-CM | POA: Diagnosis not present

## 2020-09-21 DIAGNOSIS — Z7901 Long term (current) use of anticoagulants: Secondary | ICD-10-CM | POA: Insufficient documentation

## 2020-09-21 DIAGNOSIS — Z808 Family history of malignant neoplasm of other organs or systems: Secondary | ICD-10-CM | POA: Diagnosis not present

## 2020-09-21 DIAGNOSIS — Z7984 Long term (current) use of oral hypoglycemic drugs: Secondary | ICD-10-CM | POA: Diagnosis not present

## 2020-09-21 DIAGNOSIS — Z8261 Family history of arthritis: Secondary | ICD-10-CM | POA: Diagnosis not present

## 2020-09-21 DIAGNOSIS — Z8249 Family history of ischemic heart disease and other diseases of the circulatory system: Secondary | ICD-10-CM | POA: Diagnosis not present

## 2020-09-21 DIAGNOSIS — Z951 Presence of aortocoronary bypass graft: Secondary | ICD-10-CM | POA: Diagnosis not present

## 2020-09-21 DIAGNOSIS — M7651 Patellar tendinitis, right knee: Secondary | ICD-10-CM | POA: Diagnosis not present

## 2020-09-21 DIAGNOSIS — S86811A Strain of other muscle(s) and tendon(s) at lower leg level, right leg, initial encounter: Secondary | ICD-10-CM | POA: Diagnosis present

## 2020-09-21 DIAGNOSIS — Z87891 Personal history of nicotine dependence: Secondary | ICD-10-CM | POA: Insufficient documentation

## 2020-09-21 DIAGNOSIS — I480 Paroxysmal atrial fibrillation: Secondary | ICD-10-CM | POA: Diagnosis not present

## 2020-09-21 HISTORY — PX: PATELLAR TENDON REPAIR: SHX737

## 2020-09-21 LAB — GLUCOSE, CAPILLARY
Glucose-Capillary: 106 mg/dL — ABNORMAL HIGH (ref 70–99)
Glucose-Capillary: 126 mg/dL — ABNORMAL HIGH (ref 70–99)
Glucose-Capillary: 111 mg/dL — ABNORMAL HIGH (ref 70–99)

## 2020-09-21 SURGERY — REPAIR, TENDON, PATELLAR
Anesthesia: General | Site: Knee | Laterality: Right

## 2020-09-21 MED ORDER — PHENYLEPHRINE 40 MCG/ML (10ML) SYRINGE FOR IV PUSH (FOR BLOOD PRESSURE SUPPORT)
PREFILLED_SYRINGE | INTRAVENOUS | Status: DC | PRN
  Administered 2020-09-21: 80 ug via INTRAVENOUS
  Administered 2020-09-21 (×2): 40 ug via INTRAVENOUS
  Administered 2020-09-21 (×3): 80 ug via INTRAVENOUS

## 2020-09-21 MED ORDER — HYDROCODONE-ACETAMINOPHEN 7.5-325 MG PO TABS
1.0000 | ORAL_TABLET | Freq: Three times a day (TID) | ORAL | 0 refills | Status: DC | PRN
Start: 2020-09-21 — End: 2020-10-03

## 2020-09-21 MED ORDER — PROPOFOL 10 MG/ML IV BOLUS
INTRAVENOUS | Status: DC | PRN
  Administered 2020-09-21: 100 mg via INTRAVENOUS

## 2020-09-21 MED ORDER — ORAL CARE MOUTH RINSE: 15.0000 mL | Freq: Once | OROMUCOSAL | Status: AC

## 2020-09-21 MED ORDER — LACTATED RINGERS IV SOLN: INTRAVENOUS | Status: DC

## 2020-09-21 MED ORDER — ACETAMINOPHEN 500 MG PO TABS
ORAL_TABLET | ORAL | Status: AC
  Filled 2020-09-21: qty 2

## 2020-09-21 MED ORDER — ONDANSETRON HCL 4 MG/2ML IJ SOLN
INTRAMUSCULAR | Status: DC | PRN
  Administered 2020-09-21: 4 mg via INTRAVENOUS

## 2020-09-21 MED ORDER — 0.9 % SODIUM CHLORIDE (POUR BTL) OPTIME
TOPICAL | Status: DC | PRN
  Administered 2020-09-21: 1000 mL

## 2020-09-21 MED ORDER — DEXAMETHASONE SODIUM PHOSPHATE 10 MG/ML IJ SOLN
INTRAMUSCULAR | Status: DC | PRN
  Administered 2020-09-21: 10 mg via INTRAVENOUS

## 2020-09-21 MED ORDER — PHENYLEPHRINE 40 MCG/ML (10ML) SYRINGE FOR IV PUSH (FOR BLOOD PRESSURE SUPPORT)
PREFILLED_SYRINGE | INTRAVENOUS | Status: AC
  Filled 2020-09-21: qty 10

## 2020-09-21 MED ORDER — LIDOCAINE 2% (20 MG/ML) 5 ML SYRINGE
INTRAMUSCULAR | Status: AC
  Filled 2020-09-21: qty 5

## 2020-09-21 MED ORDER — CELECOXIB 200 MG PO CAPS
ORAL_CAPSULE | ORAL | Status: AC
  Filled 2020-09-21: qty 1

## 2020-09-21 MED ORDER — CHLORHEXIDINE GLUCONATE 0.12 % MT SOLN
OROMUCOSAL | Status: AC
  Administered 2020-09-21: 15 mL via OROMUCOSAL
  Filled 2020-09-21: qty 15

## 2020-09-21 MED ORDER — CELECOXIB 200 MG PO CAPS
200.0000 mg | ORAL_CAPSULE | Freq: Once | ORAL | Status: AC
  Administered 2020-09-21: 200 mg via ORAL

## 2020-09-21 MED ORDER — BUPIVACAINE-EPINEPHRINE (PF) 0.5% -1:200000 IJ SOLN
INTRAMUSCULAR | Status: DC | PRN
  Administered 2020-09-21: 30 mL via PERINEURAL

## 2020-09-21 MED ORDER — FENTANYL CITRATE (PF) 250 MCG/5ML IJ SOLN
INTRAMUSCULAR | Status: AC
  Filled 2020-09-21: qty 5

## 2020-09-21 MED ORDER — CEFAZOLIN SODIUM-DEXTROSE 2-4 GM/100ML-% IV SOLN
2.0000 g | INTRAVENOUS | Status: AC
  Administered 2020-09-21: 2 g via INTRAVENOUS
  Filled 2020-09-21: qty 100

## 2020-09-21 MED ORDER — MIDAZOLAM HCL 5 MG/5ML IJ SOLN
INTRAMUSCULAR | Status: DC | PRN
  Administered 2020-09-21: 2 mg via INTRAVENOUS

## 2020-09-21 MED ORDER — HYDROMORPHONE HCL 1 MG/ML IJ SOLN
INTRAMUSCULAR | Status: AC
  Filled 2020-09-21: qty 1

## 2020-09-21 MED ORDER — DEXAMETHASONE SODIUM PHOSPHATE 10 MG/ML IJ SOLN
INTRAMUSCULAR | Status: AC
  Filled 2020-09-21: qty 1

## 2020-09-21 MED ORDER — MIDAZOLAM HCL 2 MG/2ML IJ SOLN
INTRAMUSCULAR | Status: AC
  Administered 2020-09-21: 2 mg via INTRAVENOUS
  Filled 2020-09-21: qty 2

## 2020-09-21 MED ORDER — PROPOFOL 10 MG/ML IV BOLUS
INTRAVENOUS | Status: AC
  Filled 2020-09-21: qty 20

## 2020-09-21 MED ORDER — ROCURONIUM BROMIDE 10 MG/ML (PF) SYRINGE
PREFILLED_SYRINGE | INTRAVENOUS | Status: AC
  Filled 2020-09-21: qty 10

## 2020-09-21 MED ORDER — FENTANYL CITRATE (PF) 100 MCG/2ML IJ SOLN
INTRAMUSCULAR | Status: AC
  Administered 2020-09-21: 100 ug via INTRAVENOUS
  Filled 2020-09-21: qty 2

## 2020-09-21 MED ORDER — MIDAZOLAM HCL 2 MG/2ML IJ SOLN
INTRAMUSCULAR | Status: AC
  Filled 2020-09-21: qty 2

## 2020-09-21 MED ORDER — LACTATED RINGERS IV SOLN: INTRAVENOUS | Status: DC | PRN

## 2020-09-21 MED ORDER — SUGAMMADEX SODIUM 200 MG/2ML IV SOLN
INTRAVENOUS | Status: DC | PRN
  Administered 2020-09-21: 200 mg via INTRAVENOUS

## 2020-09-21 MED ORDER — LIDOCAINE 2% (20 MG/ML) 5 ML SYRINGE
INTRAMUSCULAR | Status: DC | PRN
  Administered 2020-09-21: 60 mg via INTRAVENOUS

## 2020-09-21 MED ORDER — HYDROMORPHONE HCL 1 MG/ML IJ SOLN
0.2500 mg | INTRAMUSCULAR | Status: DC | PRN
  Administered 2020-09-21 (×2): 0.5 mg via INTRAVENOUS

## 2020-09-21 MED ORDER — FENTANYL CITRATE (PF) 100 MCG/2ML IJ SOLN: 100.0000 ug | Freq: Once | INTRAMUSCULAR | Status: AC

## 2020-09-21 MED ORDER — BUPIVACAINE HCL (PF) 0.25 % IJ SOLN
INTRAMUSCULAR | Status: AC
  Filled 2020-09-21: qty 30

## 2020-09-21 MED ORDER — ONDANSETRON HCL 4 MG/2ML IJ SOLN
INTRAMUSCULAR | Status: AC
  Filled 2020-09-21: qty 2

## 2020-09-21 MED ORDER — ACETAMINOPHEN 500 MG PO TABS
1000.0000 mg | ORAL_TABLET | Freq: Once | ORAL | Status: AC
  Administered 2020-09-21: 1000 mg via ORAL

## 2020-09-21 MED ORDER — CHLORHEXIDINE GLUCONATE 0.12 % MT SOLN: 15.0000 mL | Freq: Once | OROMUCOSAL | Status: AC

## 2020-09-21 MED ORDER — ROCURONIUM BROMIDE 10 MG/ML (PF) SYRINGE
PREFILLED_SYRINGE | INTRAVENOUS | Status: DC | PRN
  Administered 2020-09-21: 60 mg via INTRAVENOUS

## 2020-09-21 MED ORDER — MIDAZOLAM HCL 2 MG/2ML IJ SOLN: 2.0000 mg | Freq: Once | INTRAMUSCULAR | Status: AC

## 2020-09-21 SURGICAL SUPPLY — 50 items
BNDG CMPR MED 10X6 ELC LF (GAUZE/BANDAGES/DRESSINGS) ×1
BNDG COHESIVE 6X5 TAN STRL LF (GAUZE/BANDAGES/DRESSINGS) ×2 IMPLANT
BNDG ELASTIC 6X10 VLCR STRL LF (GAUZE/BANDAGES/DRESSINGS) ×1 IMPLANT
CUFF TOURN SGL QUICK 34 (TOURNIQUET CUFF)
CUFF TOURN SGL QUICK 42 (TOURNIQUET CUFF) ×1 IMPLANT
CUFF TRNQT CYL 34X4.125X (TOURNIQUET CUFF) IMPLANT
DRAPE IMP U-DRAPE 54X76 (DRAPES) ×2 IMPLANT
DRAPE INCISE IOBAN 66X45 STRL (DRAPES) ×2 IMPLANT
DRSG AQUACEL AG ADV 3.5X14 (GAUZE/BANDAGES/DRESSINGS) ×1 IMPLANT
DURAPREP 26ML APPLICATOR (WOUND CARE) ×4 IMPLANT
ELECT CAUTERY BLADE 6.4 (BLADE) ×2 IMPLANT
ELECT REM PT RETURN 9FT ADLT (ELECTROSURGICAL) ×2
ELECTRODE REM PT RTRN 9FT ADLT (ELECTROSURGICAL) ×1 IMPLANT
GAUZE SPONGE 4X4 12PLY STRL (GAUZE/BANDAGES/DRESSINGS) ×2 IMPLANT
GAUZE XEROFORM 5X9 LF (GAUZE/BANDAGES/DRESSINGS) IMPLANT
GLOVE BIOGEL PI IND STRL 7.0 (GLOVE) ×1 IMPLANT
GLOVE BIOGEL PI INDICATOR 7.0 (GLOVE) ×1
GLOVE ECLIPSE 7.0 STRL STRAW (GLOVE) ×2 IMPLANT
GLOVE SKINSENSE NS SZ7.5 (GLOVE) ×2
GLOVE SKINSENSE STRL SZ7.5 (GLOVE) ×2 IMPLANT
GOWN STRL REIN XL XLG (GOWN DISPOSABLE) ×6 IMPLANT
KIT BASIN OR (CUSTOM PROCEDURE TRAY) ×2 IMPLANT
KIT TURNOVER KIT B (KITS) ×2 IMPLANT
NDL HYPO 25GX1X1/2 BEV (NEEDLE) IMPLANT
NEEDLE HYPO 25GX1X1/2 BEV (NEEDLE) IMPLANT
NS IRRIG 1000ML POUR BTL (IV SOLUTION) ×2 IMPLANT
PACK ORTHO EXTREMITY (CUSTOM PROCEDURE TRAY) ×2 IMPLANT
PAD ARMBOARD 7.5X6 YLW CONV (MISCELLANEOUS) ×4 IMPLANT
PAD CAST 3X4 CTTN HI CHSV (CAST SUPPLIES) IMPLANT
PADDING CAST COTTON 3X4 STRL (CAST SUPPLIES)
PADDING CAST COTTON 6X4 STRL (CAST SUPPLIES) IMPLANT
PASSER SUT SWANSON 36MM LOOP (INSTRUMENTS) IMPLANT
SPONGE LAP 18X18 RF (DISPOSABLE) ×2 IMPLANT
STOCKINETTE IMPERVIOUS LG (DRAPES) ×2 IMPLANT
SUCTION FRAZIER HANDLE 10FR (MISCELLANEOUS) ×2
SUCTION TUBE FRAZIER 10FR DISP (MISCELLANEOUS) ×1 IMPLANT
SUT ETHIBOND 5 LR DA (SUTURE) ×6 IMPLANT
SUT ETHILON 2 0 FS 18 (SUTURE) IMPLANT
SUT ETHILON 3 0 PS 1 (SUTURE) IMPLANT
SUT FIBERWIRE #2 38 T-5 BLUE (SUTURE)
SUT VIC AB 0 CT1 27 (SUTURE)
SUT VIC AB 0 CT1 27XBRD ANBCTR (SUTURE) IMPLANT
SUT VIC AB 2-0 CT1 27 (SUTURE)
SUT VIC AB 2-0 CT1 TAPERPNT 27 (SUTURE) IMPLANT
SUTURE FIBERWR #2 38 T-5 BLUE (SUTURE) IMPLANT
SYR CONTROL 10ML LL (SYRINGE) IMPLANT
TOWEL GREEN STERILE (TOWEL DISPOSABLE) ×4 IMPLANT
TOWEL GREEN STERILE FF (TOWEL DISPOSABLE) ×2 IMPLANT
TUBE CONNECTING 12X1/4 (SUCTIONS) ×2 IMPLANT
WATER STERILE IRR 1000ML POUR (IV SOLUTION) ×2 IMPLANT

## 2020-09-21 NOTE — Anesthesia Procedure Notes (Signed)
Procedure Name: Intubation Date/Time: 09/21/2020 12:28 PM Performed by: Harden Mo, CRNA Pre-anesthesia Checklist: Patient identified, Emergency Drugs available, Suction available and Patient being monitored Patient Re-evaluated:Patient Re-evaluated prior to induction Oxygen Delivery Method: Circle System Utilized Preoxygenation: Pre-oxygenation with 100% oxygen Induction Type: IV induction Ventilation: Mask ventilation without difficulty and Oral airway inserted - appropriate to patient size Laryngoscope Size: Sabra Heck and 2 Grade View: Grade I Tube type: Oral Tube size: 7.5 mm Number of attempts: 1 Airway Equipment and Method: Stylet and Oral airway Placement Confirmation: ETT inserted through vocal cords under direct vision,  positive ETCO2 and breath sounds checked- equal and bilateral Secured at: 23 cm Tube secured with: Tape Dental Injury: Teeth and Oropharynx as per pre-operative assessment

## 2020-09-21 NOTE — Transfer of Care (Signed)
Immediate Anesthesia Transfer of Care Note  Patient: Bruce Robertson  Procedure(s) Performed: PATELLA TENDON REPAIR (Right Knee)  Patient Location: PACU  Anesthesia Type:General and Regional  Level of Consciousness: awake, alert  and drowsy  Airway & Oxygen Therapy: Patient Spontanous Breathing  Post-op Assessment: Report given to RN, Post -op Vital signs reviewed and stable and Patient moving all extremities X 4  Post vital signs: Reviewed and stable  Last Vitals:  Vitals Value Taken Time  BP 110/80 09/21/20 1350  Temp    Pulse 49 09/21/20 1351  Resp 19 09/21/20 1351  SpO2 96 % 09/21/20 1351  Vitals shown include unvalidated device data.  Last Pain:  Vitals:   09/21/20 1006  TempSrc:   PainSc: 8       Patients Stated Pain Goal: 3 (12/82/08 1388)  Complications: No complications documented.

## 2020-09-21 NOTE — Discharge Instructions (Signed)
° °  Postoperative instructions:  Weightbearing instructions: as tolerated in the knee brace  Dressing instructions: Keep your dressing and/or splint clean and dry at all times.  It will be removed at your first post-operative appointment.  Your stitches and/or staples will be removed at this visit.  Incision instructions:  Do not soak your incision for 3 weeks after surgery.  If the incision gets wet, pat dry and do not scrub the incision.  Pain control:  You have been given a prescription to be taken as directed for post-operative pain control.  In addition, elevate the operative extremity above the heart at all times to prevent swelling and throbbing pain.  Take over-the-counter Colace, 100mg  by mouth twice a day while taking narcotic pain medications to help prevent constipation.  Follow up appointments: 1) 14 days for suture removal and wound check. 2) Dr. Erlinda Hong as scheduled.   -------------------------------------------------------------------------------------------------------------  After Surgery Pain Control:  After your surgery, post-surgical discomfort or pain is likely. This discomfort can last several days to a few weeks. At certain times of the day your discomfort may be more intense.  Did you receive a nerve block?  A nerve block can provide pain relief for one hour to two days after your surgery. As long as the nerve block is working, you will experience little or no sensation in the area the surgeon operated on.  As the nerve block wears off, you will begin to experience pain or discomfort. It is very important that you begin taking your prescribed pain medication before the nerve block fully wears off. Treating your pain at the first sign of the block wearing off will ensure your pain is better controlled and more tolerable when full-sensation returns. Do not wait until the pain is intolerable, as the medicine will be less effective. It is better to treat pain in advance than to  try and catch up.  General Anesthesia:  If you did not receive a nerve block during your surgery, you will need to start taking your pain medication shortly after your surgery and should continue to do so as prescribed by your surgeon.  Pain Medication:  Most commonly we prescribe Vicodin and Percocet for post-operative pain. Both of these medications contain a combination of acetaminophen (Tylenol) and a narcotic to help control pain.   It takes between 30 and 45 minutes before pain medication starts to work. It is important to take your medication before your pain level gets too intense.   Nausea is a common side effect of many pain medications. You will want to eat something before taking your pain medicine to help prevent nausea.   If you are taking a prescription pain medication that contains acetaminophen, we recommend that you do not take additional over the counter acetaminophen (Tylenol).  Other pain relieving options:   Using a cold pack to ice the affected area a few times a day (15 to 20 minutes at a time) can help to relieve pain, reduce swelling and bruising.   Elevation of the affected area can also help to reduce pain and swelling.

## 2020-09-21 NOTE — H&P (Addendum)

## 2020-09-21 NOTE — Anesthesia Postprocedure Evaluation (Signed)
Anesthesia Post Note  Patient: Bruce Robertson  Procedure(s) Performed: PATELLA TENDON REPAIR (Right Knee)     Patient location during evaluation: PACU Anesthesia Type: General and Regional Level of consciousness: awake and alert Pain management: pain level controlled Vital Signs Assessment: post-procedure vital signs reviewed and stable Respiratory status: spontaneous breathing, nonlabored ventilation and respiratory function stable Cardiovascular status: blood pressure returned to baseline and stable Postop Assessment: no apparent nausea or vomiting Anesthetic complications: no   No complications documented.  Last Vitals:  Vitals:   09/21/20 1435 09/21/20 1443  BP: (!) 97/57 96/66  Pulse: (!) 57 60  Resp: 15 16  Temp:  36.5 C  SpO2: 95% 98%    Last Pain:  Vitals:   09/21/20 1435  TempSrc:   PainSc: 3                  Laporscha Linehan,W. EDMOND

## 2020-09-21 NOTE — Anesthesia Procedure Notes (Signed)
Anesthesia Regional Block: Femoral nerve block   Pre-Anesthetic Checklist: ,, timeout performed, Correct Patient, Correct Site, Correct Laterality, Correct Procedure, Correct Position, site marked, Risks and benefits discussed, pre-op evaluation,  At surgeon's request and post-op pain management  Laterality: Right  Prep: Maximum Sterile Barrier Precautions used, chloraprep       Needles:  Injection technique: Single-shot  Needle Type: Echogenic Stimulator Needle     Needle Length: 9cm  Needle Gauge: 21     Additional Needles:   Procedures:,,,, ultrasound used (permanent image in chart),,,,  Narrative:  Start time: 09/21/2020 10:28 AM End time: 09/21/2020 10:38 AM Injection made incrementally with aspirations every 5 mL. Anesthesiologist: Roderic Palau, MD  Additional Notes: 2% Lidocaine skin wheel.

## 2020-09-21 NOTE — Op Note (Addendum)
   Date of Surgery: 09/21/2020  INDICATIONS: Bruce Robertson is a 59 y.o.-year-old male with a suspected right patella tendon rupture who presents today for surgical repair.  Patient had prior patella tendon repair years ago..  The patient did consent to the procedure after discussion of the risks and benefits.  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 Eduard Roux, M.D.  ASSIST: Bruce Robertson, [place]; necessary for the timely completion of procedure and due to complexity of procedure.  ANESTHESIA:  general  IV FLUIDS AND URINE: See anesthesia.  ESTIMATED BLOOD LOSS: Minimal mL.  IMPLANTS: None  DRAINS: None  COMPLICATIONS: see description of procedure.  DESCRIPTION OF PROCEDURE: The patient was brought to the operating room.  The patient had been signed prior to the procedure and this was documented. The patient had the anesthesia placed by the anesthesiologist.  A time-out was performed to confirm that this was the correct patient, site, side and location. The patient did receive antibiotics prior to the incision and was re-dosed during the procedure as needed at indicated intervals.  A tourniquet was placed.  The patient had the operative extremity prepped and draped in the standard surgical fashion.    The previous surgical scar was incised.  Full-thickness flaps were raised.  Sharp dissection was carried down through the previous scar tissue.  I was not able to find any evidence of complete rupture.  There is no traumatic hematoma.  The joint had no effusion.  After debridement of the scar tissue in the edematous tissue near the area that he was not symptomatic I was able to find severe tendinosis of the patella tendon.  I took the knee through a full range of motion and found no palpable  defects in the patellar tendon.  There is no gapping either.  There were previous nonabsorbable sutures present that were removed.  He did have multiple interstitial tears within the substance of the tendon.  The tendon was debrided sharply back to healthy tissue and the tears were repaired with interrupted #2 FiberWire in a figure-of-eight fashion.  Tourniquet was then deflated.  Hemostasis was obtained.  Wound was thoroughly irrigated and closed in layered fashion.  Sterile dressings were applied.  Bledsoe brace placed.  Patient tolerated procedure well had no immediate complications.  POSTOPERATIVE PLAN: Weight-bear as tolerated in Bledsoe brace.  Range of motion 0 to 30 degrees.  Bruce Cecil, MD 1:24 PM

## 2020-09-22 ENCOUNTER — Encounter (HOSPITAL_COMMUNITY): Payer: Self-pay | Admitting: Orthopaedic Surgery

## 2020-09-26 NOTE — H&P (Signed)
PREOPERATIVE H&P  Chief Complaint: right patellla tendon rupture  HPI: Bruce Robertson is a 59 y.o. male who presents for surgical treatment of right patellla tendon rupture.  He denies any changes in medical history.  Past Medical History:  Diagnosis Date  . AICD (automatic cardioverter/defibrillator) present   . Anxiety 08/20/2016  . Cardiomyopathy    Nonischemic. EF has been about 45%. S/P CABG.;  b.  Echo 4/14: EF 25%, global HK with inf and mid apical AK, restrictive physiology with E/e' > 15 (elevated LV filling pressure), trivial AI/MR, mild to mod LAE, mild RVE, mild reduced RVSF, mild RAE, mod TR, PASP 74 (severe pulmonary HTN)   . CHF (congestive heart failure) ([place])   . CHF exacerbation ([place]) 07/31/2016  . Colon polyps 2002  . Coronary artery disease   . Diabetes mellitus without complication ([place])    Type II  . Dyslipidemia    hx (03/20/2017)  . GERD (gastroesophageal reflux disease)   . History of bleeding peptic ulcer   . History of echocardiogram    Echo 10/16:  EF 25-30%, poss non-compaction, diff HK with inf-lat and apical HK, restrictive physio, mild AI, severe LAE, mild RVE with mild reduced RVSF, PASP 42 mmHg  . Hyperplastic rectal polyp   . Moderate episode of recurrent major depressive disorder ([place]) 08/20/2016   09/14/20 - not current  . OSA on CPAP    Mild  . Pneumonia 09/2018  . Tobacco abuse    Remote   Past Surgical History:  Procedure Laterality Date  . CARDIAC CATHETERIZATION  2007;  2009  . CARDIAC DEFIBRILLATOR PLACEMENT  03/20/2017  . COLONOSCOPY WITH PROPOFOL N/A 01/21/2019   Procedure: COLONOSCOPY WITH PROPOFOL;  Surgeon: Milus Banister, MD;  Location: [facility];  Service: Endoscopy;  Laterality: N/A;  . CORONARY ARTERY BYPASS GRAFT  2007   Had a left main dissection after catheterization. Underwent a saphenous vein graft to the LAD and a saphenous vein graft to obtuse marginal.  . HEMORRHOID SURGERY N/A 08/23/2020   Procedure:  HEMORRHOIDECTOMY WITH LIGATION AND HEMORRHOIDOPEXY;  Surgeon: Michael Boston, MD;  Location: [facility];  Service: General;  Laterality: N/A;  . ICD IMPLANT N/A 03/20/2017   Procedure: ICD Implant;  Surgeon: Constance Haw, MD;  Location: [facility];  Service: Cardiovascular;  Laterality: N/A;  . PATELLAR TENDON REPAIR Right 2005  . PATELLAR TENDON REPAIR Right 09/21/2020   Procedure: PATELLA TENDON REPAIR;  Surgeon: Leandrew Koyanagi, MD;  Location: [facility];  Service: Orthopedics;  Laterality: Right;  . POLYPECTOMY  01/21/2019   Procedure: POLYPECTOMY;  Surgeon: Milus Banister, MD;  Location: [facility];  Service: Endoscopy;;  . RECTAL EXAM UNDER ANESTHESIA Left 08/23/2020   Procedure: RECTAL EXAM UNDER ANESTHESIA;  Surgeon: Michael Boston, MD;  Location: [facility];  Service: General;  Laterality: Left;   Social History   Socioeconomic History  . Marital status: Divorced    Spouse name: Not on file  . Number of children: 5  . Years of education: Not on file  . Highest education level: Not on file  Occupational History  . Occupation: Civil engineer, contracting    Employer: [employer]  Tobacco Use  . Smoking status: Former Smoker    Packs/day: 0.25    Years: 7.00    Pack years: 1.75  . Smokeless tobacco: Never Used  . Tobacco comment: 03/20/2017 "quit in 1997  Vaping Use  . Vaping Use: Former  . Devices: vaped for about  a month  Substance and Sexual Activity  . Alcohol use: Not Currently    Alcohol/week: 0.0 standard drinks    Comment: twice a month no preference  . Drug use: Yes    Frequency: 1.0 times per week    Types: Marijuana    Comment: last time 09/15/20  . Sexual activity: Not Currently  Other Topics Concern  . Not on file  Social History Narrative   Single   Social Determinants of Health   Financial Resource Strain:   . Difficulty of Paying Living Expenses: Not on file  Food Insecurity:   . Worried About Charity fundraiser in the Last Year: Not on file  . Ran  Out of Food in the Last Year: Not on file  Transportation Needs: No Transportation Needs  . Lack of Transportation (Medical): No  . Lack of Transportation (Non-Medical): No  Physical Activity: Sufficiently Active  . Days of Exercise per Week: 4 days  . Minutes of Exercise per Session: 40 min  Stress:   . Feeling of Stress : Not on file  Social Connections:   . Frequency of Communication with Friends and Family: Not on file  . Frequency of Social Gatherings with Friends and Family: Not on file  . Attends Religious Services: Not on file  . Active Member of Clubs or Organizations: Not on file  . Attends Archivist Meetings: Not on file  . Marital Status: Not on file   Family History  Problem Relation Age of Onset  . Hypertension Mother   . Osteoarthritis Mother   . Heart failure Father 17  . High blood pressure Father   . Lung cancer Other   . Cancer Maternal Grandmother        uncertain type  . Bipolar disorder Daughter   . Diabetes Daughter   . Cancer Maternal Uncle        uncertain type  . Diabetes Niece   . Asthma Neg Hx    No Known Allergies Prior to Admission medications   Medication Sig Start Date End Date Taking? Authorizing Provider  carvedilol (COREG) 12.5 MG tablet Take 1 tablet (12.5 mg total) by mouth 2 (two) times daily. 11/26/19  Yes Camnitz, Will Hassell Done, MD  ELIQUIS 5 MG TABS tablet Take 1 tablet by mouth twice daily Patient taking differently: Take 5 mg by mouth 2 (two) times daily.  08/31/20  Yes Camnitz, Ocie Doyne, MD  ibuprofen (ADVIL) 200 MG tablet Take 400 mg by mouth every 6 (six) hours as needed for mild pain or moderate pain.    Yes [provider]  JARDIANCE 10 MG TABS tablet Take 10 mg by mouth daily.  06/29/20  Yes [provider]  Multiple Vitamin (MULTIVITAMIN WITH MINERALS) TABS tablet Take 1 tablet by mouth daily.   Yes [provider]  omeprazole (PRILOSEC) 40 MG capsule Take 40 mg by mouth daily before  breakfast.   Yes [provider]  sacubitril-valsartan (ENTRESTO) 24-26 MG Take 1 tablet by mouth in the morning and at bedtime.  03/30/20  Yes [provider]  simvastatin (ZOCOR) 40 MG tablet Take 40 mg by mouth at bedtime.  05/08/20  Yes [provider]  spironolactone (ALDACTONE) 25 MG tablet Take 25 mg by mouth daily.  03/30/20  Yes [provider]  torsemide (DEMADEX) 20 MG tablet Take 2 tablets (40 mg total) by mouth daily. 10/11/19 08/03/21 Yes Minus Breeding, MD  ACCU-CHEK GUIDE test strip  02/15/20  [provider]  Accu-Chek Softclix Lancets lancets  02/15/20   [provider]  blood glucose meter kit and supplies KIT Dispense based on patient and insurance preference. Use up to four times daily as directed. 02/14/20   Caren Macadam, MD  HYDROcodone-acetaminophen (NORCO) 7.5-325 MG tablet Take 1-2 tablets by mouth 3 (three) times daily as needed for moderate pain. 09/21/20   Leandrew Koyanagi, MD     Positive ROS: All other systems have been reviewed and were otherwise negative with the exception of those mentioned in the HPI and as above.  Physical Exam: General: Alert, no acute distress Cardiovascular: No pedal edema Respiratory: No cyanosis, no use of accessory musculature GI: abdomen soft Skin: No lesions in the area of chief complaint Neurologic: Sensation intact distally Psychiatric: Patient is competent for consent with normal mood and affect Lymphatic: no lymphedema  MUSCULOSKELETAL: exam stable  Assessment: right patellla tendon rupture  Plan: Plan for Procedure(s): PATELLA TENDON REPAIR  The risks benefits and alternatives were discussed with the patient including but not limited to the risks of nonoperative treatment, versus surgical intervention including infection, bleeding, nerve injury,  blood clots, cardiopulmonary complications, morbidity, mortality, among others, and they were willing to proceed.    Preoperative templating of the joint replacement has been completed, documented, and submitted to the Operating Room personnel in order to optimize intra-operative equipment management.   Eduard Roux, MD 09/26/2020 7:05 AM

## 2020-10-03 ENCOUNTER — Other Ambulatory Visit: Payer: Self-pay

## 2020-10-03 ENCOUNTER — Ambulatory Visit (INDEPENDENT_AMBULATORY_CARE_PROVIDER_SITE_OTHER): Payer: Medicare HMO | Admitting: Orthopaedic Surgery

## 2020-10-03 ENCOUNTER — Encounter: Payer: Self-pay | Admitting: Orthopaedic Surgery

## 2020-10-03 DIAGNOSIS — S86811A Strain of other muscle(s) and tendon(s) at lower leg level, right leg, initial encounter: Secondary | ICD-10-CM

## 2020-10-03 MED ORDER — HYDROCODONE-ACETAMINOPHEN 7.5-325 MG PO TABS
1.0000 | ORAL_TABLET | Freq: Every day | ORAL | 0 refills | Status: DC | PRN
Start: 2020-10-03 — End: 2020-11-14

## 2020-10-03 NOTE — Progress Notes (Signed)
Post-Op Visit Note   Patient: Bruce Robertson           Date of Birth: 07/29/1961           MRN: [id number] Visit Date: 10/03/2020 PCP: Caren Macadam, MD   Assessment & Plan:  Chief Complaint:  Chief Complaint  Patient presents with  . Right Knee - Routine Post Op   Visit Diagnoses:  1. Patellar tendon rupture, right, initial encounter     Plan:   Mr. Trainer is 2 weeks status post patella tendon repair.  Overall doing well.  Pain is significantly improved.  He is able to perform a straight leg raise against gravity.  He can flex the knee to about 45 degrees without pain.  Surgical incision is healed without any signs of infection.  No joint effusion.  The brace was opened up to 90 degrees of flexion and he can perform range of motion as tolerated.  I made a referral to outpatient PT to start rehab as his patella tendon injury was interstitial and partial.  We will recheck him in 4 weeks.  Follow-Up Instructions: Return in about 4 weeks (around 10/31/2020).   Orders:  Orders Placed This Encounter  Procedures  . Ambulatory referral to Physical Therapy   Meds ordered this encounter  Medications  . HYDROcodone-acetaminophen (NORCO) 7.5-325 MG tablet    Sig: Take 1-2 tablets by mouth daily as needed for moderate pain.    Dispense:  20 tablet    Refill:  0    Imaging: No results found.  PMFS History: Patient Active Problem List   Diagnosis Date Noted  . Patellar tendon rupture, right, initial encounter 09/13/2020  . Restrictive lung disease 06/22/2020  . BRBPR (bright red blood per rectum) 05/24/2020  . Chronic systolic CHF (congestive heart failure) ([place]) 05/24/2020  . Acute blood loss anemia 05/24/2020  . Paroxysmal atrial fibrillation ([place]) 03/28/2020  . Secondary hypercoagulable state ([place]) 03/28/2020  . Educated about COVID-19 virus infection 02/10/2020  . S/P ICD (internal cardiac defibrillator) procedure 07/18/2019  . Nontraumatic complete tear of  right rotator cuff 06/29/2019  . Pain in left elbow 06/29/2019  . Prediabetes 06/10/2018  . Thrombocytopenia ([place]) 06/10/2018  . AICD (automatic cardioverter/defibrillator) present 06/10/2018  . History of peptic ulcer disease 06/10/2018  . NSVT (nonsustained ventricular tachycardia) ([place])   . Hypotension due to drugs 02/09/2018  . Difficulty controlling anger 12/31/2016  . Anxiety 08/20/2016  . Moderate episode of recurrent major depressive disorder ([place]) 08/20/2016  . Polyp of colon 04/29/2016  . Shortness of breath 03/05/2016  . Essential hypertension 10/31/2015  . Acute on chronic systolic heart failure ([place]) 08/29/2015  . Hx of CABG 08/29/2015  . Stage 3 chronic kidney disease ([place]) 08/29/2015  . Sleep apnea 06/10/2011  . Fatigue 06/10/2011  . Obesity (BMI 30-39.9) 05/02/2010  . Hemorrhoids 02/08/2010  . Esophageal reflux 02/08/2010  . Rectal bleeding 02/08/2010  . Hyperlipidemia 04/06/2009  . TOBACCO USER 04/06/2009  . Cardiomyopathy, dilated, nonischemic ([place]) 04/06/2009  . Non-ischemic cardiomyopathy ([place]) 04/06/2009   Past Medical History:  Diagnosis Date  . AICD (automatic cardioverter/defibrillator) present   . Anxiety 08/20/2016  . Cardiomyopathy    Nonischemic. EF has been about 45%. S/P CABG.;  b.  Echo 4/14: EF 25%, global HK with inf and mid apical AK, restrictive physiology with E/e' > 15 (elevated LV filling pressure), trivial AI/MR, mild to mod LAE, mild RVE, mild reduced RVSF, mild RAE, mod TR, PASP 74 (severe pulmonary HTN)   .  CHF (congestive heart failure) ([place])   . CHF exacerbation ([place]) 07/31/2016  . Colon polyps 2002  . Coronary artery disease   . Diabetes mellitus without complication ([place])    Type II  . Dyslipidemia    hx (03/20/2017)  . GERD (gastroesophageal reflux disease)   . History of bleeding peptic ulcer   . History of echocardiogram    Echo 10/16:  EF 25-30%, poss non-compaction, diff HK with inf-lat and apical HK, restrictive physio,  mild AI, severe LAE, mild RVE with mild reduced RVSF, PASP 42 mmHg  . Hyperplastic rectal polyp   . Moderate episode of recurrent major depressive disorder ([place]) 08/20/2016   09/14/20 - not current  . OSA on CPAP    Mild  . Pneumonia 09/2018  . Tobacco abuse    Remote    Family History  Problem Relation Age of Onset  . Hypertension Mother   . Osteoarthritis Mother   . Heart failure Father 22  . High blood pressure Father   . Lung cancer Other   . Cancer Maternal Grandmother        uncertain type  . Bipolar disorder Daughter   . Diabetes Daughter   . Cancer Maternal Uncle        uncertain type  . Diabetes Niece   . Asthma Neg Hx     Past Surgical History:  Procedure Laterality Date  . CARDIAC CATHETERIZATION  2007;  2009  . CARDIAC DEFIBRILLATOR PLACEMENT  03/20/2017  . COLONOSCOPY WITH PROPOFOL N/A 01/21/2019   Procedure: COLONOSCOPY WITH PROPOFOL;  Surgeon: Milus Banister, MD;  [facility];  Service: Endoscopy;  Laterality: N/A;  . CORONARY ARTERY BYPASS GRAFT  2007   Had a left main dissection after catheterization. Underwent a saphenous vein graft to the LAD and a saphenous vein graft to obtuse marginal.  . HEMORRHOID SURGERY N/A 08/23/2020   Procedure: HEMORRHOIDECTOMY WITH LIGATION AND HEMORRHOIDOPEXY;  Surgeon: Michael Boston, MD;  Location[facility];  Service: General;  Laterality: N/A;  . ICD IMPLANT N/A 03/20/2017   Procedure: ICD Implant;  Surgeon: Constance Haw, MD;  Location: [facility];  Service: Cardiovascular;  Laterality: N/A;  . PATELLAR TENDON REPAIR Right 2005  . PATELLAR TENDON REPAIR Right 09/21/2020   Procedure: PATELLA TENDON REPAIR;  Surgeon: Leandrew Koyanagi, MD;  Location: [facility];  Service: Orthopedics;  Laterality: Right;  . POLYPECTOMY  01/21/2019   Procedure: POLYPECTOMY;  Surgeon: Milus Banister, MD;  Location: [facility];  Service: Endoscopy;;  . RECTAL EXAM UNDER ANESTHESIA Left 08/23/2020   Procedure: RECTAL EXAM UNDER  ANESTHESIA;  Surgeon: Michael Boston, MD;  Location: [facility];  Service: General;  Laterality: Left;   Social History   Occupational History  . Occupation: Civil engineer, contracting    Employer: [employer]  Tobacco Use  . Smoking status: Former Smoker    Packs/day: 0.25    Years: 7.00    Pack years: 1.75  . Smokeless tobacco: Never Used  . Tobacco comment: 03/20/2017 "quit in 1997  Vaping Use  . Vaping Use: Former  . Devices: vaped for about a month  Substance and Sexual Activity  . Alcohol use: Not Currently    Alcohol/week: 0.0 standard drinks    Comment: twice a month no preference  . Drug use: Yes    Frequency: 1.0 times per week    Types: Marijuana    Comment: last time 09/15/20  . Sexual activity: Not Currently

## 2020-10-05 DIAGNOSIS — I5022 Chronic systolic (congestive) heart failure: Secondary | ICD-10-CM | POA: Diagnosis not present

## 2020-10-05 DIAGNOSIS — I4891 Unspecified atrial fibrillation: Secondary | ICD-10-CM | POA: Diagnosis not present

## 2020-10-05 DIAGNOSIS — I428 Other cardiomyopathies: Secondary | ICD-10-CM | POA: Diagnosis not present

## 2020-10-05 DIAGNOSIS — N183 Chronic kidney disease, stage 3 unspecified: Secondary | ICD-10-CM | POA: Diagnosis not present

## 2020-10-05 DIAGNOSIS — E1122 Type 2 diabetes mellitus with diabetic chronic kidney disease: Secondary | ICD-10-CM | POA: Diagnosis not present

## 2020-10-05 DIAGNOSIS — G4733 Obstructive sleep apnea (adult) (pediatric): Secondary | ICD-10-CM | POA: Diagnosis not present

## 2020-10-05 DIAGNOSIS — I083 Combined rheumatic disorders of mitral, aortic and tricuspid valves: Secondary | ICD-10-CM | POA: Diagnosis not present

## 2020-10-05 DIAGNOSIS — Z9989 Dependence on other enabling machines and devices: Secondary | ICD-10-CM | POA: Diagnosis not present

## 2020-10-05 DIAGNOSIS — R931 Abnormal findings on diagnostic imaging of heart and coronary circulation: Secondary | ICD-10-CM | POA: Diagnosis not present

## 2020-10-06 ENCOUNTER — Ambulatory Visit (INDEPENDENT_AMBULATORY_CARE_PROVIDER_SITE_OTHER): Payer: Medicare HMO | Admitting: Rehabilitative and Restorative Service Providers"

## 2020-10-06 ENCOUNTER — Other Ambulatory Visit: Payer: Self-pay

## 2020-10-06 ENCOUNTER — Encounter: Payer: Self-pay | Admitting: Rehabilitative and Restorative Service Providers"

## 2020-10-06 DIAGNOSIS — R262 Difficulty in walking, not elsewhere classified: Secondary | ICD-10-CM | POA: Diagnosis not present

## 2020-10-06 DIAGNOSIS — M25661 Stiffness of right knee, not elsewhere classified: Secondary | ICD-10-CM

## 2020-10-06 DIAGNOSIS — M6281 Muscle weakness (generalized): Secondary | ICD-10-CM | POA: Diagnosis not present

## 2020-10-06 NOTE — Therapy (Signed)
[facility] Physical Therapy [address] [place], [place], [address] Phone: [phone]   Fax:  [phone]  Physical Therapy Evaluation  Patient Details  Name: Bruce Robertson MRN: [id number] Date of Birth: 10-02-1961 Referring Provider (PT): Frankey Shown MD   Encounter Date: 10/06/2020   PT End of Session - 10/06/20 1212    Visit Number 1    Number of Visits 30    Date for PT Re-Evaluation 12/01/20    PT Start Time 1100    PT Stop Time 1148    PT Time Calculation (min) 48 min    Equipment Utilized During Treatment Right knee immobilizer    Activity Tolerance Patient tolerated treatment well;No increased pain;Patient limited by fatigue    Behavior During Therapy [facility] for tasks assessed/performed           Past Medical History:  Diagnosis Date  . AICD (automatic cardioverter/defibrillator) present   . Anxiety 08/20/2016  . Cardiomyopathy    Nonischemic. EF has been about 45%. S/P CABG.;  b.  Echo 4/14: EF 25%, global HK with inf and mid apical AK, restrictive physiology with E/e' > 15 (elevated LV filling pressure), trivial AI/MR, mild to mod LAE, mild RVE, mild reduced RVSF, mild RAE, mod TR, PASP 74 (severe pulmonary HTN)   . CHF (congestive heart failure) ([place])   . CHF exacerbation ([place]) 07/31/2016  . Colon polyps 2002  . Coronary artery disease   . Diabetes mellitus without complication ([place])    Type II  . Dyslipidemia    hx (03/20/2017)  . GERD (gastroesophageal reflux disease)   . History of bleeding peptic ulcer   . History of echocardiogram    Echo 10/16:  EF 25-30%, poss non-compaction, diff HK with inf-lat and apical HK, restrictive physio, mild AI, severe LAE, mild RVE with mild reduced RVSF, PASP 42 mmHg  . Hyperplastic rectal polyp   . Moderate episode of recurrent major depressive disorder ([place]) 08/20/2016   09/14/20 - not current  . OSA on CPAP    Mild  . Pneumonia 09/2018  . Tobacco abuse    Remote    Past Surgical History:    Procedure Laterality Date  . CARDIAC CATHETERIZATION  2007;  2009  . CARDIAC DEFIBRILLATOR PLACEMENT  03/20/2017  . COLONOSCOPY WITH PROPOFOL N/A 01/21/2019   Procedure: COLONOSCOPY WITH PROPOFOL;  Surgeon: Milus Banister, MD;  [facility];  Service: Endoscopy;  Laterality: N/A;  . CORONARY ARTERY BYPASS GRAFT  2007   Had a left main dissection after catheterization. Underwent a saphenous vein graft to the LAD and a saphenous vein graft to obtuse marginal.  . HEMORRHOID SURGERY N/A 08/23/2020   Procedure: HEMORRHOIDECTOMY WITH LIGATION AND HEMORRHOIDOPEXY;  Surgeon: Michael Boston, MD;  [facility];  Service: General;  Laterality: N/A;  . ICD IMPLANT N/A 03/20/2017   Procedure: ICD Implant;  Surgeon: Constance Haw, MD;  Location: [place] [facility];  Service: Cardiovascular;  Laterality: N/A;  . PATELLAR TENDON REPAIR Right 2005  . PATELLAR TENDON REPAIR Right 09/21/2020   Procedure: PATELLA TENDON REPAIR;  Surgeon: Leandrew Koyanagi, MD;  Location: [place];  Service: Orthopedics;  Laterality: Right;  . POLYPECTOMY  01/21/2019   Procedure: POLYPECTOMY;  Surgeon: Milus Banister, MD;  Location[facility];  Service: Endoscopy;;  . RECTAL EXAM UNDER ANESTHESIA Left 08/23/2020   Procedure: RECTAL EXAM UNDER ANESTHESIA;  Surgeon: Michael Boston, MD;  [facility];  Service: General;  Laterality: Left;    There  were no vitals filed for this visit.    Subjective Assessment - 10/06/20 1204    Subjective Bruce Robertson had his second R patellar tendon repair on 09/21/2020.  His first repair was in 2005.  First injury was related to kick boxing.  Second injury was afer standing up in bed.  Pain is minimal and strength is poor.    Pertinent History Significant cardiac history and 2X patellar tendon repair    Limitations Lifting;Standing;Walking    How long can you sit comfortably? Not limited    How long can you stand comfortably? 15+ minutes    How long can you walk comfortably?  10+ minutes    Patient Stated Goals Walk without brace and return to the gym    Currently in Pain? No/denies              [facility] PT Assessment - 10/06/20 0001      Assessment   Medical Diagnosis R Patellar tendon repair    Referring Provider (PT) Frankey Shown MD    Onset Date/Surgical Date 09/21/20      Restrictions   Weight Bearing Restrictions No      Balance Screen   Has the patient fallen in the past 6 months No    Has the patient had a decrease in activity level because of a fear of falling?  No    Is the patient reluctant to leave their home because of a fear of falling?  No      Prior Function   Level of Independence Independent      Cognition   Overall Cognitive Status Within Functional Limits for tasks assessed      Observation/Other Assessments   Focus on Therapeutic Outcomes (FOTO)  54 (67 Goal)      ROM / Strength   AROM / PROM / Strength AROM;Strength      AROM   AROM Assessment Site Knee    Right/Left Knee Right;Left    Right Knee Extension 0    Right Knee Flexion 100    Left Knee Extension 0    Left Knee Flexion 140      Strength   Overall Strength Deficits    Strength Assessment Site Knee    Right/Left Knee Right;Left    Right Knee Extension --   52 cm of thigh girth 15 cm proximal to the superior patella   Left Knee Extension --   53.5 cm thigh girth 15 cm above the superior patellar pole                     Objective measurements completed on examination: See above findings.       [place] Adult PT Treatment/Exercise - 10/06/20 0001      Ambulation/Gait   Gait Comments Bruce Robertson is ambulating in his knee brace prescribed by Dr. Erlinda Hong.      Blood Flow Restriction   Blood Flow Restriction Yes      Blood Flow Restriction-Positions    Blood Flow Restriction Position comment   Next visit     Exercises   Exercises Knee/Hip      Knee/Hip Exercises: Seated   Long Arc Quad Strengthening;Both;3 sets   3 reps seated straight leg raises  (in chair with pillows)     Knee/Hip Exercises: Supine   Quad Sets Strengthening;Both;Other (comment)   5 minutes with 5 second contraction/5 second rest                 PT  Education - 10/06/20 1209    Education Details Discussed the more fragile nature of a second repair and the need to go slower and return to activities as recommended.  Recommendations to avoid running, jumping and extreme loads long-term when returning to normal activities.  Prescribed and implemented an appropriate HEP.    Person(s) Educated Patient    Methods Explanation;Verbal cues;Handout;Tactile cues    Comprehension Verbalized understanding;Returned demonstration;Verbal cues required;Need further instruction;Tactile cues required            PT Short Term Goals - 10/06/20 1219      PT SHORT TERM GOAL #1   Title Bruce Robertson will be independent and compliant with his starter HEP.    Time 2    Period Weeks    Status New    Target Date 10/20/20             PT Long Term Goals - 10/06/20 1220      PT LONG TERM GOAL #1   Title Improve FOTO score to 67.    Baseline 54    Time 12    Period Weeks    Status New    Target Date 01/05/21      PT LONG TERM GOAL #2   Title Improve R knee flexion AROM to 135 degrees.    Baseline 100 degrees.    Time 12    Period Weeks    Status New    Target Date 01/05/21      PT LONG TERM GOAL #3   Title Improve R quadriceps strength as assessed by increased thigh girth and decreased atrophy as compared to the uninvolved L.    Baseline 52 cm vs 53.5 cm    Time 12    Period Weeks    Status New    Target Date 01/05/21      PT LONG TERM GOAL #4   Title Bruce Robertson will be able to walk a mile or more without his brace without pain at DC.    Time 12    Period Weeks    Status New    Target Date 01/05/21      PT LONG TERM GOAL #5   Title Bruce Robertson will be independent with his long-term HEP at DC.    Time 12    Period Weeks    Status New    Target Date 01/05/21                    Plan - 10/06/20 1212    Clinical Impression Statement Bruce Robertson just had his second R patellar tendon repair on 09/21/2020.  Strength will be the main emphasis as AROM is excellent at 100 degrees (for this point post-surgery).  Blood flow restriction is recommended for a heavy quadriceps emphasis program.    Personal Factors and Comorbidities Comorbidity 1    Comorbidities 2X repair of same tendon, cardiac history    Examination-Activity Limitations Transfers;Squat;Lift;Bend;Locomotion Level;Stairs;Stand    Examination-Participation Restrictions Community Activity;Occupation    Stability/Clinical Decision Making Stable/Uncomplicated    Clinical Decision Making Low    Rehab Potential Good    PT Frequency Other (comment)   2-3X/week for BFR   PT Duration 12 weeks    PT Treatment/Interventions ADLs/Self Care Home Management;Electrical Stimulation;Cryotherapy;Gait training;Therapeutic exercise;Therapeutic activities;Stair training;Balance training;Neuromuscular re-education;Patient/family education;Manual techniques;Vasopneumatic Device;Other (comment)   Blood Flow Restriction   PT Next Visit Plan Blood Flow Restriction with heavy quadriceps strengthening emphasis    PT Home Exercise Plan Access Code: [facility]  Consulted and Agree with Plan of Care Patient           Patient will benefit from skilled therapeutic intervention in order to improve the following deficits and impairments:  Abnormal gait, Decreased range of motion, Decreased strength, Difficulty walking, Increased edema, Impaired flexibility, Pain  Visit Diagnosis: Difficulty in walking, not elsewhere classified  Muscle weakness (generalized)  Stiffness of right knee, not elsewhere classified     Problem List Patient Active Problem List   Diagnosis Date Noted  . Patellar tendon rupture, right, initial encounter 09/13/2020  . Restrictive lung disease 06/22/2020  . BRBPR (bright red blood per rectum)  05/24/2020  . Chronic systolic CHF (congestive heart failure) ([place]) 05/24/2020  . Acute blood loss anemia 05/24/2020  . Paroxysmal atrial fibrillation ([place]) 03/28/2020  . Secondary hypercoagulable state ([place]) 03/28/2020  . Educated about COVID-19 virus infection 02/10/2020  . S/P ICD (internal cardiac defibrillator) procedure 07/18/2019  . Nontraumatic complete tear of right rotator cuff 06/29/2019  . Pain in left elbow 06/29/2019  . Prediabetes 06/10/2018  . Thrombocytopenia ([place]) 06/10/2018  . AICD (automatic cardioverter/defibrillator) present 06/10/2018  . History of peptic ulcer disease 06/10/2018  . NSVT (nonsustained ventricular tachycardia) ([employer])   . Hypotension due to drugs 02/09/2018  . Difficulty controlling anger 12/31/2016  . Anxiety 08/20/2016  . Moderate episode of recurrent major depressive disorder ([place]) 08/20/2016  . Polyp of colon 04/29/2016  . Shortness of breath 03/05/2016  . Essential hypertension 10/31/2015  . Acute on chronic systolic heart failure ([place]) 08/29/2015  . Hx of CABG 08/29/2015  . Stage 3 chronic kidney disease ([place]) 08/29/2015  . Sleep apnea 06/10/2011  . Fatigue 06/10/2011  . Obesity (BMI 30-39.9) 05/02/2010  . Hemorrhoids 02/08/2010  . Esophageal reflux 02/08/2010  . Rectal bleeding 02/08/2010  . Hyperlipidemia 04/06/2009  . TOBACCO USER 04/06/2009  . Cardiomyopathy, dilated, nonischemic ([place]) 04/06/2009  . Non-ischemic cardiomyopathy ([place]) 04/06/2009    Farley Ly PT, MPT 10/06/2020, 12:24 PM  [facility] Physical Therapy [address] [place], [place], [address] Phone: [phone]   Fax:  [phone]  Name: Bruce Robertson MRN: [id number] Date of Birth: August 17, 1961

## 2020-10-06 NOTE — Patient Instructions (Signed)
Access Code: [email or web address] URL: [email or web address] Date: 10/06/2020 Prepared by: Vista Mink  Exercises Supine Quadricep Sets - 7 x weekly - 5 seconds hold Small Range Straight Leg Raise - 1 x daily - 7 x weekly - 10 sets - 3 reps - 3 secondsinutes hold

## 2020-10-09 ENCOUNTER — Ambulatory Visit: Payer: Medicare HMO | Admitting: Physical Therapy

## 2020-10-09 ENCOUNTER — Other Ambulatory Visit: Payer: Self-pay

## 2020-10-09 DIAGNOSIS — R262 Difficulty in walking, not elsewhere classified: Secondary | ICD-10-CM | POA: Diagnosis not present

## 2020-10-09 DIAGNOSIS — M6281 Muscle weakness (generalized): Secondary | ICD-10-CM

## 2020-10-09 DIAGNOSIS — M25661 Stiffness of right knee, not elsewhere classified: Secondary | ICD-10-CM

## 2020-10-09 NOTE — Therapy (Signed)
[facility] Physical Therapy [address] [place], [place], [address] Phone: [phone]   Fax:  [phone]  Physical Therapy Treatment  Patient Details  Name: Bruce Robertson MRN: [id number] Date of Birth: August 08, 1961 Referring Provider (PT): Frankey Shown MD   Encounter Date: 10/09/2020   PT End of Session - 10/09/20 1104    Visit Number 2    Number of Visits 30    Date for PT Re-Evaluation 12/01/20    PT Start Time 2831    PT Stop Time 1105    PT Time Calculation (min) 42 min    Equipment Utilized During Treatment Right knee immobilizer    Activity Tolerance Patient tolerated treatment well;No increased pain;Patient limited by fatigue    Behavior During Therapy [facility] for tasks assessed/performed           Past Medical History:  Diagnosis Date  . AICD (automatic cardioverter/defibrillator) present   . Anxiety 08/20/2016  . Cardiomyopathy    Nonischemic. EF has been about 45%. S/P CABG.;  b.  Echo 4/14: EF 25%, global HK with inf and mid apical AK, restrictive physiology with E/e' > 15 (elevated LV filling pressure), trivial AI/MR, mild to mod LAE, mild RVE, mild reduced RVSF, mild RAE, mod TR, PASP 74 (severe pulmonary HTN)   . CHF (congestive heart failure) ([place])   . CHF exacerbation ([place]) 07/31/2016  . Colon polyps 2002  . Coronary artery disease   . Diabetes mellitus without complication ([place])    Type II  . Dyslipidemia    hx (03/20/2017)  . GERD (gastroesophageal reflux disease)   . History of bleeding peptic ulcer   . History of echocardiogram    Echo 10/16:  EF 25-30%, poss non-compaction, diff HK with inf-lat and apical HK, restrictive physio, mild AI, severe LAE, mild RVE with mild reduced RVSF, PASP 42 mmHg  . Hyperplastic rectal polyp   . Moderate episode of recurrent major depressive disorder ([place]) 08/20/2016   09/14/20 - not current  . OSA on CPAP    Mild  . Pneumonia 09/2018  . Tobacco abuse    Remote    Past Surgical History:   Procedure Laterality Date  . CARDIAC CATHETERIZATION  2007;  2009  . CARDIAC DEFIBRILLATOR PLACEMENT  03/20/2017  . COLONOSCOPY WITH PROPOFOL N/A 01/21/2019   Procedure: COLONOSCOPY WITH PROPOFOL;  Surgeon: Milus Banister, MD;  [facility];  Service: Endoscopy;  Laterality: N/A;  . CORONARY ARTERY BYPASS GRAFT  2007   Had a left main dissection after catheterization. Underwent a saphenous vein graft to the LAD and a saphenous vein graft to obtuse marginal.  . HEMORRHOID SURGERY N/A 08/23/2020   Procedure: HEMORRHOIDECTOMY WITH LIGATION AND HEMORRHOIDOPEXY;  Surgeon: Michael Boston, MD;  [facility];  Service: General;  Laterality: N/A;  . ICD IMPLANT N/A 03/20/2017   Procedure: ICD Implant;  Surgeon: Constance Haw, MD;  Location: [place] [facility];  Service: Cardiovascular;  Laterality: N/A;  . PATELLAR TENDON REPAIR Right 2005  . PATELLAR TENDON REPAIR Right 09/21/2020   Procedure: PATELLA TENDON REPAIR;  Surgeon: Leandrew Koyanagi, MD;  Location: [place];  Service: Orthopedics;  Laterality: Right;  . POLYPECTOMY  01/21/2019   Procedure: POLYPECTOMY;  Surgeon: Milus Banister, MD;  [facility];  Service: Endoscopy;;  . RECTAL EXAM UNDER ANESTHESIA Left 08/23/2020   Procedure: RECTAL EXAM UNDER ANESTHESIA;  Surgeon: Michael Boston, MD;  [facility];  Service: General;  Laterality: Left;    There were  no vitals filed for this visit.   Subjective Assessment - 10/09/20 1056    Subjective He relays his knee is doing well post op, not much pain coming in and he has been working on stretching            [facility] Adult PT Treatment/Exercise - 10/09/20 0001      Blood Flow Restriction   Blood Flow Restriction Yes      Blood Flow Restriction-Positions    Blood Flow Restriction Position Supine;Sitting;Standing   Cuff size 4     BFR-Supine   Supine Limb Occulsion Pressure (mmHg) 150    Supine Exercise Prescription 30,15,15,15, reps w/ 30-60 sec rest    Supine  Exercise Prescription Comment quad sets      BFR Sitting   Sitting Limb Occulsion Pressure (mmHg) 170    Sitting Exercise Pressure (mmHg) 135    Sitting Exercise Prescription 30,15,15,15, reps w/ 30-60 sec rest    Sitting Exercise Prescription Comment seated SLR, seated LAQ      BFR Standing   Standing Limb Occulsion Pressure (mmHg) 210    Standing Exercise Pressure (mmHg) 110    Standing Exercise Prescription 30,15,15,15, reps w/ 30-60 sec rest      Knee/Hip Exercises: Stretches   Knee: Self-Stretch Limitations seated taigate flexion stretch with O.P, 10 sec X 10      Knee/Hip Exercises: Aerobic   Nustep L5 X 5 min                  PT Education - 10/09/20 1104    Education Details BFR protocol and rationale    Person(s) Educated Patient    Methods Explanation    Comprehension Verbalized understanding            PT Short Term Goals - 10/06/20 1219      PT SHORT TERM GOAL #1   Title Cordie will be independent and compliant with his starter HEP.    Time 2    Period Weeks    Status New    Target Date 10/20/20             PT Long Term Goals - 10/06/20 1220      PT LONG TERM GOAL #1   Title Improve FOTO score to 67.    Baseline 54    Time 12    Period Weeks    Status New    Target Date 01/05/21      PT LONG TERM GOAL #2   Title Improve R knee flexion AROM to 135 degrees.    Baseline 100 degrees.    Time 12    Period Weeks    Status New    Target Date 01/05/21      PT LONG TERM GOAL #3   Title Improve R quadriceps strength as assessed by increased thigh girth and decreased atrophy as compared to the uninvolved L.    Baseline 52 cm vs 53.5 cm    Time 12    Period Weeks    Status New    Target Date 01/05/21      PT LONG TERM GOAL #4   Title Corry will be able to walk a mile or more without his brace without pain at DC.    Time 12    Period Weeks    Status New    Target Date 01/05/21      PT LONG TERM GOAL #5   Title Tomothy will be  independent with his long-term  HEP at DC.    Time 12    Period Weeks    Status New    Target Date 01/05/21                 Plan - 10/09/20 1105    Clinical Impression Statement He is doing excellent with ROM. Session focused on quad strength with BFR training. PT will assess his response to this next visit but he had great tolerance with this within session.    Personal Factors and Comorbidities Comorbidity 1    Comorbidities 2X repair of same tendon, cardiac history    Examination-Activity Limitations Transfers;Squat;Lift;Bend;Locomotion Level;Stairs;Stand    Examination-Participation Restrictions Community Activity;Occupation    Stability/Clinical Decision Making Stable/Uncomplicated    Rehab Potential Good    PT Frequency Other (comment)   2-3X/week for BFR   PT Duration 12 weeks    PT Treatment/Interventions ADLs/Self Care Home Management;Electrical Stimulation;Cryotherapy;Gait training;Therapeutic exercise;Therapeutic activities;Stair training;Balance training;Neuromuscular re-education;Patient/family education;Manual techniques;Vasopneumatic Device;Other (comment)   Blood Flow Restriction   PT Next Visit Plan Blood Flow Restriction with heavy quadriceps strengthening emphasis    PT Home Exercise Plan Access Code: [email or web address]    Consulted and Agree with Plan of Care Patient           Patient will benefit from skilled therapeutic intervention in order to improve the following deficits and impairments:  Abnormal gait, Decreased range of motion, Decreased strength, Difficulty walking, Increased edema, Impaired flexibility, Pain  Visit Diagnosis: Difficulty in walking, not elsewhere classified  Muscle weakness (generalized)  Stiffness of right knee, not elsewhere classified     Problem List Patient Active Problem List   Diagnosis Date Noted  . Patellar tendon rupture, right, initial encounter 09/13/2020  . Restrictive lung disease 06/22/2020  . BRBPR (bright red blood  per rectum) 05/24/2020  . Chronic systolic CHF (congestive heart failure) ([place]) 05/24/2020  . Acute blood loss anemia 05/24/2020  . Paroxysmal atrial fibrillation ([place]) 03/28/2020  . Secondary hypercoagulable state ([place]) 03/28/2020  . Educated about COVID-19 virus infection 02/10/2020  . S/P ICD (internal cardiac defibrillator) procedure 07/18/2019  . Nontraumatic complete tear of right rotator cuff 06/29/2019  . Pain in left elbow 06/29/2019  . Prediabetes 06/10/2018  . Thrombocytopenia ([place]) 06/10/2018  . AICD (automatic cardioverter/defibrillator) present 06/10/2018  . History of peptic ulcer disease 06/10/2018  . NSVT (nonsustained ventricular tachycardia) ([place])   . Hypotension due to drugs 02/09/2018  . Difficulty controlling anger 12/31/2016  . Anxiety 08/20/2016  . Moderate episode of recurrent major depressive disorder ([place]) 08/20/2016  . Polyp of colon 04/29/2016  . Shortness of breath 03/05/2016  . Essential hypertension 10/31/2015  . Acute on chronic systolic heart failure ([place]) 08/29/2015  . Hx of CABG 08/29/2015  . Stage 3 chronic kidney disease ([place]) 08/29/2015  . Sleep apnea 06/10/2011  . Fatigue 06/10/2011  . Obesity (BMI 30-39.9) 05/02/2010  . Hemorrhoids 02/08/2010  . Esophageal reflux 02/08/2010  . Rectal bleeding 02/08/2010  . Hyperlipidemia 04/06/2009  . TOBACCO USER 04/06/2009  . Cardiomyopathy, dilated, nonischemic ([place]) 04/06/2009  . Non-ischemic cardiomyopathy ([place]) 04/06/2009    Silvestre Mesi 10/09/2020, 11:16 AM  [facility] Physical Therapy [address] [place], [place], [address] Phone: [phone]   Fax:  [phone]  Name: Yamin Swingler MRN: [id number] Date of Birth: [birth year]

## 2020-10-16 ENCOUNTER — Encounter: Payer: Medicare HMO | Admitting: Physical Therapy

## 2020-10-18 NOTE — Progress Notes (Deleted)
Cardiology Office Note   Date:  10/18/2020   ID:  Bruce Robertson, DOB 1961/08/07, MRN [id number]  PCP:  Caren Macadam, MD  Cardiologist:   Minus Breeding, MD   No chief complaint on file.     History of Present Illness: Bruce Robertson is a 59 y.o. male who presents for follow up of a non-ischemic cardiomyopathy (EF initially 40%, improved to 45%, then dropped to 20-25%), s/p CABG in 2007 due to diagnostic cath complicated by LM dissection, s/p ICD for primary prevention.  His course over the years has been difficult because of social issues, occasional inability to obtain meds and inconsistent follow up with appts.    He has been seen at [facility].  There was a question of non compaction.  EF is about 20%.  I reviewed [facility] records and He had an echo earlier this month.   ***  *** He presents for follow up.  He has had increased SOB.  Since I last saw him he has had an appt with Dr. Claiborne Billings and had adjustment to his BiPAP.  He saw Dr. Curt Bears and had normal ICD function with no events. He had a repeat echo and had an EF of 15 - 20%.   He had severely elevated pulmonary pressures as well with moderate RV dysfunction.   He has severe TR.  He actually feels better since he had his BiPAP adjusted.  He says he is sleeping better.  He is less fatigued.  He is not having the cough that he was having.  He is still however describing 2 pillow orthopnea.  Sounds like he has class III symptoms.  He is not really having any leg swelling.  He is short of breath halfway up the stairs.  He denies any chest discomfort, neck or arm discomfort.  He is not noticing any palpitations, presyncope or syncope.  He has no significant weight gain though it fluctuates.  He does watch his salt and weight himself daily.   Past Medical History:  Diagnosis Date  . AICD (automatic cardioverter/defibrillator) present   . Anxiety 08/20/2016  . Cardiomyopathy    Nonischemic. EF has been about 45%. S/P CABG.;  b.   Echo 4/14: EF 25%, global HK with inf and mid apical AK, restrictive physiology with E/e' > 15 (elevated LV filling pressure), trivial AI/MR, mild to mod LAE, mild RVE, mild reduced RVSF, mild RAE, mod TR, PASP 74 (severe pulmonary HTN)   . CHF (congestive heart failure) ([place])   . CHF exacerbation ([place]) 07/31/2016  . Colon polyps 2002  . Coronary artery disease   . Diabetes mellitus without complication ([place])    Type II  . Dyslipidemia    hx (03/20/2017)  . GERD (gastroesophageal reflux disease)   . History of bleeding peptic ulcer   . History of echocardiogram    Echo 10/16:  EF 25-30%, poss non-compaction, diff HK with inf-lat and apical HK, restrictive physio, mild AI, severe LAE, mild RVE with mild reduced RVSF, PASP 42 mmHg  . Hyperplastic rectal polyp   . Moderate episode of recurrent major depressive disorder ([place]) 08/20/2016   09/14/20 - not current  . OSA on CPAP    Mild  . Pneumonia 09/2018  . Tobacco abuse    Remote    Past Surgical History:  Procedure Laterality Date  . CARDIAC CATHETERIZATION  2007;  2009  . CARDIAC DEFIBRILLATOR PLACEMENT  03/20/2017  . COLONOSCOPY WITH PROPOFOL N/A 01/21/2019   Procedure: COLONOSCOPY  WITH PROPOFOL;  Surgeon: Milus Banister, MD;  Location: [facility];  Service: Endoscopy;  Laterality: N/A;  . CORONARY ARTERY BYPASS GRAFT  2007   Had a left main dissection after catheterization. Underwent a saphenous vein graft to the LAD and a saphenous vein graft to obtuse marginal.  . HEMORRHOID SURGERY N/A 08/23/2020   Procedure: HEMORRHOIDECTOMY WITH LIGATION AND HEMORRHOIDOPEXY;  Surgeon: Michael Boston, MD;  Location: [facility];  Service: General;  Laterality: N/A;  . ICD IMPLANT N/A 03/20/2017   Procedure: ICD Implant;  Surgeon: Constance Haw, MD;  Location: [place];  Service: Cardiovascular;  Laterality: N/A;  . PATELLAR TENDON REPAIR Right 2005  . PATELLAR TENDON REPAIR Right 09/21/2020   Procedure: PATELLA TENDON REPAIR;   Surgeon: Leandrew Koyanagi, MD;  Location: [facility];  Service: Orthopedics;  Laterality: Right;  . POLYPECTOMY  01/21/2019   Procedure: POLYPECTOMY;  Surgeon: Milus Banister, MD;  Location: [facility];  Service: Endoscopy;;  . RECTAL EXAM UNDER ANESTHESIA Left 08/23/2020   Procedure: RECTAL EXAM UNDER ANESTHESIA;  Surgeon: Michael Boston, MD;  Location: [facility];  Service: General;  Laterality: Left;     Current Outpatient Medications  Medication Sig Dispense Refill  . ACCU-CHEK GUIDE test strip     . Accu-Chek Softclix Lancets lancets     . blood glucose meter kit and supplies KIT Dispense based on patient and insurance preference. Use up to four times daily as directed. 1 each 0  . carvedilol (COREG) 12.5 MG tablet Take 1 tablet (12.5 mg total) by mouth 2 (two) times daily. 60 tablet 11  . ELIQUIS 5 MG TABS tablet Take 1 tablet by mouth twice daily (Patient taking differently: Take 5 mg by mouth 2 (two) times daily. ) 180 tablet 0  . HYDROcodone-acetaminophen (NORCO) 7.5-325 MG tablet Take 1-2 tablets by mouth daily as needed for moderate pain. 20 tablet 0  . ibuprofen (ADVIL) 200 MG tablet Take 400 mg by mouth every 6 (six) hours as needed for mild pain or moderate pain.     Marland Kitchen JARDIANCE 10 MG TABS tablet Take 10 mg by mouth daily.     . Multiple Vitamin (MULTIVITAMIN WITH MINERALS) TABS tablet Take 1 tablet by mouth daily.    Marland Kitchen omeprazole (PRILOSEC) 40 MG capsule Take 40 mg by mouth daily before breakfast.    . sacubitril-valsartan (ENTRESTO) 24-26 MG Take 1 tablet by mouth in the morning and at bedtime.     . simvastatin (ZOCOR) 40 MG tablet Take 40 mg by mouth at bedtime.     Marland Kitchen spironolactone (ALDACTONE) 25 MG tablet Take 25 mg by mouth daily.     Marland Kitchen torsemide (DEMADEX) 20 MG tablet Take 2 tablets (40 mg total) by mouth daily. 60 tablet 6   No current facility-administered medications for this visit.    Allergies:   Patient has no known allergies.     ROS:  Please see the history of  present illness.   Otherwise, review of systems are positive for none.   All other systems are reviewed and negative.    PHYSICAL EXAM: VS:  There were no vitals taken for this visit. , BMI There is no height or weight on file to calculate BMI. GENERAL:  Well appearing NECK:  No jugular venous distention, positive HJR, waveform within normal limits, carotid upstroke brisk and symmetric, no bruits, no thyromegaly LUNGS:  Clear to auscultation bilaterally CHEST:  Unremarkable HEART:  PMI not displaced or sustained,S1 and  S2 within normal limits, no S3, no S4, no clicks, no rubs, 3 out of 6 holosystolic murmur heard best at the left third and fourth intercostal space, no diastolic murmurs ABD:  Flat, positive bowel sounds normal in frequency in pitch, no bruits, no rebound, no guarding, no midline pulsatile mass, no hepatomegaly, no splenomegaly EXT:  2 plus pulses throughout, no edema, no cyanosis no clubbing   EKG:  EKG is not ordered today.    Recent Labs: 05/23/2020: B Natriuretic Peptide 356.1 08/15/2020: Hemoglobin 11.9; Platelets 167 09/18/2020: ALT 24; BUN 18; Creatinine, Ser 1.71; Potassium 3.5; Sodium 135    Lipid Panel    Component Value Date/Time   CHOL 161 02/11/2020 1641   TRIG 135 02/11/2020 1641   HDL 46 02/11/2020 1641   CHOLHDL 3.5 02/11/2020 1641   VLDL 37.2 11/25/2018 1640   LDLCALC 92 02/11/2020 1641   LDLDIRECT 166.4 05/17/2010 0905      Wt Readings from Last 3 Encounters:  09/21/20 220 lb (99.8 kg)  09/18/20 217 lb (98.4 kg)  09/11/20 224 lb 6.4 oz (101.8 kg)      Other studies Reviewed: Additional studies/ records that were reviewed today include: Device interrogation Review of the above records demonstrates:  Please see elsewhere in the note.     Current medicines are reviewed at length with the patient today.  The patient does not have concerns regarding medicines.  The following changes have been made:  NA  Labs/ tests ordered today include:    No orders of the defined types were placed in this encounter.   ASSESSMENT AND PLAN:    CHRONIC SYSTOLIC HF:  ***:  He has progressive decline in LV function.  He has progressed to likely require advanced therapies.  He has not tolerated med titration in the past.  He was seen in Advanced HF clinic in the past but fell away from this.  I will refer him back.  I will order CPX testing.  He has had progression of his cardiomyopathy.  He seems to have some more RV dysfunction.  For now I do not think he will tolerate med titration.  We talked about salt and fluid restriction and he is adherent with this.  ICD:  He has had normal ICD function.   I reviewed the interrogation from October.  ***  OSA:    ***  He has had adjustment to his device.   S/p CABG:  ***  Is not describing any chest pain.  No further work-up.  HLD: LDL is 93 with an HDL of 45.  I will increase his simvastatin to 40 mg daily.  DM:    A1c was 6.8 .  *** in April and he should have follow-up of this soon.  I will defer to Caren Macadam, MD   CKD stage 3:  ***This has been stable with his most recent creatinine being 1.53.   COVID EDUCATION: ***    Signed, Minus Breeding, MD  10/18/2020 10:02 PM    [facility]

## 2020-10-20 ENCOUNTER — Encounter: Payer: Self-pay | Admitting: Rehabilitative and Restorative Service Providers"

## 2020-10-20 ENCOUNTER — Other Ambulatory Visit: Payer: Self-pay

## 2020-10-20 ENCOUNTER — Ambulatory Visit (INDEPENDENT_AMBULATORY_CARE_PROVIDER_SITE_OTHER): Payer: Medicare HMO | Admitting: Rehabilitative and Restorative Service Providers"

## 2020-10-20 ENCOUNTER — Ambulatory Visit: Payer: Medicare HMO | Admitting: Cardiology

## 2020-10-20 DIAGNOSIS — M25661 Stiffness of right knee, not elsewhere classified: Secondary | ICD-10-CM | POA: Diagnosis not present

## 2020-10-20 DIAGNOSIS — M6281 Muscle weakness (generalized): Secondary | ICD-10-CM | POA: Diagnosis not present

## 2020-10-20 DIAGNOSIS — R262 Difficulty in walking, not elsewhere classified: Secondary | ICD-10-CM

## 2020-10-20 NOTE — Therapy (Signed)
[facility] [address] [place], [place], [address] Phone: [phone]   Fax:  [phone]  Physical Therapy Treatment  Patient Details  Name: Bruce Robertson MRN: [id number] Date of Birth: 05-02-1961 Referring Provider (PT): Frankey Shown MD   Encounter Date: 10/20/2020   PT End of Session - 10/20/20 1325    Visit Number 3    Number of Visits 30    Date for PT Re-Evaluation 12/01/20    PT Start Time 1146    PT Stop Time 1226    PT Time Calculation (min) 40 min    Equipment Utilized During Treatment Right knee immobilizer    Activity Tolerance Patient tolerated treatment well;No increased pain;Patient limited by fatigue    Behavior During Therapy [facility] for tasks assessed/performed           Past Medical History:  Diagnosis Date  . AICD (automatic cardioverter/defibrillator) present   . Anxiety 08/20/2016  . Cardiomyopathy    Nonischemic. EF has been about 45%. S/P CABG.;  b.  Echo 4/14: EF 25%, global HK with inf and mid apical AK, restrictive physiology with E/e' > 15 (elevated LV filling pressure), trivial AI/MR, mild to mod LAE, mild RVE, mild reduced RVSF, mild RAE, mod TR, PASP 74 (severe pulmonary HTN)   . CHF (congestive heart failure) ([place])   . CHF exacerbation ([place]) 07/31/2016  . Colon polyps 2002  . Coronary artery disease   . Diabetes mellitus without complication ([place])    Type II  . Dyslipidemia    hx (03/20/2017)  . GERD (gastroesophageal reflux disease)   . History of bleeding peptic ulcer   . History of echocardiogram    Echo 10/16:  EF 25-30%, poss non-compaction, diff HK with inf-lat and apical HK, restrictive physio, mild AI, severe LAE, mild RVE with mild reduced RVSF, PASP 42 mmHg  . Hyperplastic rectal polyp   . Moderate episode of recurrent major depressive disorder ([place]) 08/20/2016   09/14/20 - not current  . OSA on CPAP    Mild  . Pneumonia 09/2018  . Tobacco abuse    Remote    Past Surgical History:   Procedure Laterality Date  . CARDIAC CATHETERIZATION  2007;  2009  . CARDIAC DEFIBRILLATOR PLACEMENT  03/20/2017  . COLONOSCOPY WITH PROPOFOL N/A 01/21/2019   Procedure: COLONOSCOPY WITH PROPOFOL;  Surgeon: Milus Banister, MD;  Location: [facility];  Service: Endoscopy;  Laterality: N/A;  . CORONARY ARTERY BYPASS GRAFT  2007   Had a left main dissection after catheterization. Underwent a saphenous vein graft to the LAD and a saphenous vein graft to obtuse marginal.  . HEMORRHOID SURGERY N/A 08/23/2020   Procedure: HEMORRHOIDECTOMY WITH LIGATION AND HEMORRHOIDOPEXY;  Surgeon: Michael Boston, MD;  Location: [facility];  Service: General;  Laterality: N/A;  . ICD IMPLANT N/A 03/20/2017   Procedure: ICD Implant;  Surgeon: Constance Haw, MD;  Location: [place] [facility];  Service: Cardiovascular;  Laterality: N/A;  . PATELLAR TENDON REPAIR Right 2005  . PATELLAR TENDON REPAIR Right 09/21/2020   Procedure: PATELLA TENDON REPAIR;  Surgeon: Leandrew Koyanagi, MD;  Location: [facility];  Service: Orthopedics;  Laterality: Right;  . POLYPECTOMY  01/21/2019   Procedure: POLYPECTOMY;  Surgeon: Milus Banister, MD;  Location: [facility];  Service: Endoscopy;;  . RECTAL EXAM UNDER ANESTHESIA Left 08/23/2020   Procedure: RECTAL EXAM UNDER ANESTHESIA;  Surgeon: Michael Boston, MD;  Location: [facility];  Service: General;  Laterality: Left;    There were  no vitals filed for this visit.   Subjective Assessment - 10/20/20 1222    Subjective Bruce Robertson reports good compliance with seated straight leg raises.  I encouraged him to increase quadriceps sets as well at home.    Limitations Lifting;Standing;Walking    How long can you stand comfortably? 15+ minutes    How long can you walk comfortably? 10+ minutes    Patient Stated Goals Walk without brace and return to the gym    Currently in Pain? No/denies    Multiple Pain Sites No                             [facility] Adult PT Treatment/Exercise -  10/20/20 0001      Blood Flow Restriction   Blood Flow Restriction Yes      Blood Flow Restriction-Positions    Blood Flow Restriction Position Supine;Sitting;Standing   Cuff size 4     BFR-Supine   Supine Limb Occulsion Pressure (mmHg) 150    Supine Exercise Prescription --   10 minutes of Quadriceps sets B 10 second hold, 5 second rest   Supine Exercise Prescription Comment Quadriceps sets B (cuff on R only)      BFR Sitting   Sitting Limb Occulsion Pressure (mmHg) 170    Sitting Exercise Pressure (mmHg) 135    Sitting Exercise Prescription --   20, 10, 10, 10 with 60 seconds rest between sets   Sitting Exercise Prescription Comment Seated straight leg raises 4 pillows behind the back      Exercises   Exercises Knee/Hip      Knee/Hip Exercises: Aerobic   Recumbent Bike Seat 8 5 minutes with 135 BFR R thigh                  PT Education - 10/20/20 1323    Education Details HEP review.  Discussed prioritizing quadriceps sets along with seated straight leg raises.    Person(s) Educated Patient    Methods Explanation;Demonstration;Verbal cues;Tactile cues    Comprehension Verbalized understanding;Tactile cues required;Returned demonstration;Need further instruction;Verbal cues required            PT Short Term Goals - 10/20/20 1324      PT SHORT TERM GOAL #1   Title Bruce Robertson will be independent and compliant with his starter HEP.    Time 2    Period Weeks    Status Achieved    Target Date 10/20/20             PT Long Term Goals - 10/20/20 1324      PT LONG TERM GOAL #1   Title Improve FOTO score to 67.    Baseline 54    Time 12    Period Weeks    Status On-going      PT LONG TERM GOAL #2   Title Improve R knee flexion AROM to 135 degrees.    Baseline 100 degrees.    Time 12    Period Weeks    Status On-going      PT LONG TERM GOAL #3   Title Improve R quadriceps strength as assessed by increased thigh girth and decreased atrophy as compared to  the uninvolved L.    Baseline 52 cm vs 53.5 cm    Time 12    Period Weeks    Status On-going      PT LONG TERM GOAL #4   Title Bruce Robertson will be able  to walk a mile or more without his brace without pain at DC.    Time 12    Period Weeks    Status On-going      PT LONG TERM GOAL #5   Title Bruce Robertson will be independent with his long-term HEP at DC.    Time 12    Period Weeks    Status On-going                 Plan - 10/20/20 1325    Clinical Impression Statement Bruce Robertson is doing a great job with his seated straight leg raises at home.  I encouarged him to also complete quadriceps sets in order to prepare for more weight-bearing and functional activities.  Continue heavy quadriceps strengthening emphasis to reduce atrophy and improve WB function as healing allows.    Personal Factors and Comorbidities Comorbidity 1    Comorbidities 2X repair of same tendon, cardiac history    Examination-Activity Limitations Transfers;Squat;Lift;Bend;Locomotion Level;Stairs;Stand    Examination-Participation Restrictions Community Activity;Occupation    Stability/Clinical Decision Making Stable/Uncomplicated    Rehab Potential Good    PT Frequency Other (comment)   2-3X/week for BFR   PT Duration 12 weeks    PT Treatment/Interventions ADLs/Self Care Home Management;Electrical Stimulation;Cryotherapy;Gait training;Therapeutic exercise;Therapeutic activities;Stair training;Balance training;Neuromuscular re-education;Patient/family education;Manual techniques;Vasopneumatic Device;Other (comment)   Blood Flow Restriction   PT Next Visit Plan Blood Flow Restriction with heavy quadriceps strengthening emphasis    PT Home Exercise Plan Access Code: [email or web address]    Consulted and Agree with Plan of Care Patient           Patient will benefit from skilled therapeutic intervention in order to improve the following deficits and impairments:  Abnormal gait,Decreased range of motion,Decreased  strength,Difficulty walking,Increased edema,Impaired flexibility,Pain  Visit Diagnosis: Difficulty in walking, not elsewhere classified  Muscle weakness (generalized)  Stiffness of right knee, not elsewhere classified     Problem List Patient Active Problem List   Diagnosis Date Noted  . Patellar tendon rupture, right, initial encounter 09/13/2020  . Restrictive lung disease 06/22/2020  . BRBPR (bright red blood per rectum) 05/24/2020  . Chronic systolic CHF (congestive heart failure) ([place]) 05/24/2020  . Acute blood loss anemia 05/24/2020  . Paroxysmal atrial fibrillation ([place]) 03/28/2020  . Secondary hypercoagulable state ([place]) 03/28/2020  . Educated about COVID-19 virus infection 02/10/2020  . S/P ICD (internal cardiac defibrillator) procedure 07/18/2019  . Nontraumatic complete tear of right rotator cuff 06/29/2019  . Pain in left elbow 06/29/2019  . Prediabetes 06/10/2018  . Thrombocytopenia ([place]) 06/10/2018  . AICD (automatic cardioverter/defibrillator) present 06/10/2018  . History of peptic ulcer disease 06/10/2018  . NSVT (nonsustained ventricular tachycardia) ([place])   . Hypotension due to drugs 02/09/2018  . Difficulty controlling anger 12/31/2016  . Anxiety 08/20/2016  . Moderate episode of recurrent major depressive disorder ([place]) 08/20/2016  . Polyp of colon 04/29/2016  . Shortness of breath 03/05/2016  . Essential hypertension 10/31/2015  . Acute on chronic systolic heart failure ([place]) 08/29/2015  . Hx of CABG 08/29/2015  . Stage 3 chronic kidney disease ([place]) 08/29/2015  . Sleep apnea 06/10/2011  . Fatigue 06/10/2011  . Obesity (BMI 30-39.9) 05/02/2010  . Hemorrhoids 02/08/2010  . Esophageal reflux 02/08/2010  . Rectal bleeding 02/08/2010  . Hyperlipidemia 04/06/2009  . TOBACCO USER 04/06/2009  . Cardiomyopathy, dilated, nonischemic ([place]) 04/06/2009  . Non-ischemic cardiomyopathy ([place]) 04/06/2009    Farley Ly PT, MPT 10/20/2020, 1:27 PM  [facility] [phone]  [place], [place], [address] Phone: [phone]   Fax:  [phone]  Name: Hercules Hasler MRN: [id number] Date of Birth: [birth year]

## 2020-10-23 ENCOUNTER — Ambulatory Visit (INDEPENDENT_AMBULATORY_CARE_PROVIDER_SITE_OTHER): Payer: Medicare HMO | Admitting: Physical Therapy

## 2020-10-23 ENCOUNTER — Other Ambulatory Visit: Payer: Self-pay

## 2020-10-23 DIAGNOSIS — M6281 Muscle weakness (generalized): Secondary | ICD-10-CM | POA: Diagnosis not present

## 2020-10-23 DIAGNOSIS — M25661 Stiffness of right knee, not elsewhere classified: Secondary | ICD-10-CM

## 2020-10-23 DIAGNOSIS — R262 Difficulty in walking, not elsewhere classified: Secondary | ICD-10-CM | POA: Diagnosis not present

## 2020-10-23 NOTE — Therapy (Addendum)
[facility] [address] [place], [place], [address] Phone: [phone]   Fax:  [phone]  Physical Therapy Treatment/Discharge  Patient Details  Name: Bruce Robertson MRN: [id number] Date of Birth: 10/19/1961 Referring Provider (PT): Frankey Shown MD   Encounter Date: 10/23/2020   PT End of Session - 10/23/20 1216    Visit Number 4    Number of Visits 30    Date for PT Re-Evaluation 12/01/20    PT Start Time 6720    PT Stop Time 1232    PT Time Calculation (min) 38 min    Equipment Utilized During Treatment Right knee immobilizer    Activity Tolerance Patient tolerated treatment well;No increased pain;Patient limited by fatigue    Behavior During Therapy [facility] for tasks assessed/performed           Past Medical History:  Diagnosis Date  . AICD (automatic cardioverter/defibrillator) present   . Anxiety 08/20/2016  . Cardiomyopathy    Nonischemic. EF has been about 45%. S/P CABG.;  b.  Echo 4/14: EF 25%, global HK with inf and mid apical AK, restrictive physiology with E/e' > 15 (elevated LV filling pressure), trivial AI/MR, mild to mod LAE, mild RVE, mild reduced RVSF, mild RAE, mod TR, PASP 74 (severe pulmonary HTN)   . CHF (congestive heart failure) ([place])   . CHF exacerbation ([place]) 07/31/2016  . Colon polyps 2002  . Coronary artery disease   . Diabetes mellitus without complication ([place])    Type II  . Dyslipidemia    hx (03/20/2017)  . GERD (gastroesophageal reflux disease)   . History of bleeding peptic ulcer   . History of echocardiogram    Echo 10/16:  EF 25-30%, poss non-compaction, diff HK with inf-lat and apical HK, restrictive physio, mild AI, severe LAE, mild RVE with mild reduced RVSF, PASP 42 mmHg  . Hyperplastic rectal polyp   . Moderate episode of recurrent major depressive disorder ([place]) 08/20/2016   09/14/20 - not current  . OSA on CPAP    Mild  . Pneumonia 09/2018  . Tobacco abuse    Remote    Past Surgical  History:  Procedure Laterality Date  . CARDIAC CATHETERIZATION  2007;  2009  . CARDIAC DEFIBRILLATOR PLACEMENT  03/20/2017  . COLONOSCOPY WITH PROPOFOL N/A 01/21/2019   Procedure: COLONOSCOPY WITH PROPOFOL;  Surgeon: Milus Banister, MD;  Location: [facility]OSCOPY;  Service: Endoscopy;  Laterality: N/A;  . CORONARY ARTERY BYPASS GRAFT  2007   Had a left main dissection after catheterization. Underwent a saphenous vein graft to the LAD and a saphenous vein graft to obtuse marginal.  . HEMORRHOID SURGERY N/A 08/23/2020   Procedure: HEMORRHOIDECTOMY WITH LIGATION AND HEMORRHOIDOPEXY;  Surgeon: Michael Boston, MD;  Location: [facility]S;  Service: General;  Laterality: N/A;  . ICD IMPLANT N/A 03/20/2017   Procedure: ICD Implant;  Surgeon: Constance Haw, MD;  Location: [place] [facility] CV LAB;  Service: Cardiovascular;  Laterality: N/A;  . PATELLAR TENDON REPAIR Right 2005  . PATELLAR TENDON REPAIR Right 09/21/2020   Procedure: PATELLA TENDON REPAIR;  Surgeon: Leandrew Koyanagi, MD;  Location: [place];  Service: Orthopedics;  Laterality: Right;  . POLYPECTOMY  01/21/2019   Procedure: POLYPECTOMY;  Surgeon: Milus Banister, MD;  Location: [facility];  Service: Endoscopy;;  . RECTAL EXAM UNDER ANESTHESIA Left 08/23/2020   Procedure: RECTAL EXAM UNDER ANESTHESIA;  Surgeon: Michael Boston, MD;  Location: [facility];  Service: General;  Laterality: Left;    There were  no vitals filed for this visit.   Subjective Assessment - 10/23/20 1205    Subjective He arrives 9 minutes late for appointment but denies any complaints with his knee or pain upon arrival.    Pertinent History Significant cardiac history and 2X patellar tendon repair    Limitations Lifting;Standing;Walking    How long can you sit comfortably? Not limited    How long can you stand comfortably? 15+ minutes    How long can you walk comfortably? 10+ minutes    Patient Stated Goals Walk without brace and return to the gym            [facility] Adult PT  Treatment/Exercise - 10/23/20 0001      BFR-Supine   Supine Exercise Pressure (mmHg) 120    Supine Exercise Prescription 30,15,15,15, reps w/ 30-60 sec rest    Supine Exercise Prescription Comment SAQ with 2#      BFR Sitting   Sitting Limb Occulsion Pressure (mmHg) 170    Sitting Exercise Pressure (mmHg) 135    Sitting Exercise Prescription 30,15,15,15, reps w/ 30-60 sec rest    Sitting Exercise Prescription Comment leg press machine 37# Rt leg only, LAQ 2 #      BFR Standing   Standing Limb Occulsion Pressure (mmHg) 210    Standing Exercise Pressure (mmHg) 150    Standing Exercise Prescription 30,15,15,15, reps w/ 30-60 sec rest    Standing Exercise Prescription Comment hamstring curl 2#      Knee/Hip Exercises: Stretches   Sports administrator Right;3 reps;30 seconds    Quad Stretch Limitations supine with strap      Knee/Hip Exercises: Aerobic   Recumbent Bike Seat 8, level 9 for 5 minutes with  BFR at 85 mmHPG (50% total LOP)             PT Short Term Goals - 10/20/20 1324      PT SHORT TERM GOAL #1   Title Bruce Robertson will be independent and compliant with his starter HEP.    Time 2    Period Weeks    Status Achieved    Target Date 10/20/20             PT Long Term Goals - 10/20/20 1324      PT LONG TERM GOAL #1   Title Improve FOTO score to 67.    Baseline 54    Time 12    Period Weeks    Status On-going      PT LONG TERM GOAL #2   Title Improve R knee flexion AROM to 135 degrees.    Baseline 100 degrees.    Time 12    Period Weeks    Status On-going      PT LONG TERM GOAL #3   Title Improve R quadriceps strength as assessed by increased thigh girth and decreased atrophy as compared to the uninvolved L.    Baseline 52 cm vs 53.5 cm    Time 12    Period Weeks    Status On-going      PT LONG TERM GOAL #4   Title Bruce Robertson will be able to walk a mile or more without his brace without pain at DC.    Time 12    Period Weeks    Status On-going      PT LONG  TERM GOAL #5   Title Bruce Robertson will be independent with his long-term HEP at DC.    Time 12    Period Weeks  Status On-going                 Plan - 10/23/20 1218    Clinical Impression Statement Continued with BFR training for strength progression. He shows fair tolerance to this today as we had to deflate some during rest breaks due to pain/discomfort from the pressure of the cuff. PT will conitnue to progress as able.    Personal Factors and Comorbidities Comorbidity 1    Comorbidities 2X repair of same tendon, cardiac history    Examination-Activity Limitations Transfers;Squat;Lift;Bend;Locomotion Level;Stairs;Stand    Examination-Participation Restrictions Community Activity;Occupation    Stability/Clinical Decision Making Stable/Uncomplicated    Rehab Potential Good    PT Frequency Other (comment)   2-3X/week for BFR   PT Duration 12 weeks    PT Treatment/Interventions ADLs/Self Care Home Management;Electrical Stimulation;Cryotherapy;Gait training;Therapeutic exercise;Therapeutic activities;Stair training;Balance training;Neuromuscular re-education;Patient/family education;Manual techniques;Vasopneumatic Device;Other (comment)   Blood Flow Restriction   PT Next Visit Plan Blood Flow Restriction with heavy quadriceps strengthening emphasis    PT Home Exercise Plan Access Code: [email or web address]    Consulted and Agree with Plan of Care Patient           Patient will benefit from skilled therapeutic intervention in order to improve the following deficits and impairments:  Abnormal gait,Decreased range of motion,Decreased strength,Difficulty walking,Increased edema,Impaired flexibility,Pain  Visit Diagnosis: Difficulty in walking, not elsewhere classified  Muscle weakness (generalized)  Stiffness of right knee, not elsewhere classified     Problem List Patient Active Problem List   Diagnosis Date Noted  . Patellar tendon rupture, right, initial encounter 09/13/2020  .  Restrictive lung disease 06/22/2020  . BRBPR (bright red blood per rectum) 05/24/2020  . Chronic systolic CHF (congestive heart failure) ([place]) 05/24/2020  . Acute blood loss anemia 05/24/2020  . Paroxysmal atrial fibrillation ([place]) 03/28/2020  . Secondary hypercoagulable state ([place]) 03/28/2020  . Educated about COVID-19 virus infection 02/10/2020  . S/P ICD (internal cardiac defibrillator) procedure 07/18/2019  . Nontraumatic complete tear of right rotator cuff 06/29/2019  . Pain in left elbow 06/29/2019  . Prediabetes 06/10/2018  . Thrombocytopenia ([place]) 06/10/2018  . AICD (automatic cardioverter/defibrillator) present 06/10/2018  . History of peptic ulcer disease 06/10/2018  . NSVT (nonsustained ventricular tachycardia) ([place])   . Hypotension due to drugs 02/09/2018  . Difficulty controlling anger 12/31/2016  . Anxiety 08/20/2016  . Moderate episode of recurrent major depressive disorder ([place]) 08/20/2016  . Polyp of colon 04/29/2016  . Shortness of breath 03/05/2016  . Essential hypertension 10/31/2015  . Acute on chronic systolic heart failure ([place]) 08/29/2015  . Hx of CABG 08/29/2015  . Stage 3 chronic kidney disease ([place]) 08/29/2015  . Sleep apnea 06/10/2011  . Fatigue 06/10/2011  . Obesity (BMI 30-39.9) 05/02/2010  . Hemorrhoids 02/08/2010  . Esophageal reflux 02/08/2010  . Rectal bleeding 02/08/2010  . Hyperlipidemia 04/06/2009  . TOBACCO USER 04/06/2009  . Cardiomyopathy, dilated, nonischemic ([place]) 04/06/2009  . Non-ischemic cardiomyopathy ([place]) 04/06/2009    Bruce Robertson 10/23/2020, 12:31 PM   PHYSICAL THERAPY DISCHARGE SUMMARY  Visits from Start of Care: 4  Current functional level related to goals / functional outcomes: See note   Remaining deficits: See note   Education / Equipment: HEP Plan: Patient agrees to discharge.  Patient goals were partially met. Patient is being discharged due to not returning since the last visit.  ?????      Bruce Robertson, PT, DPT, OCS, ATC 12/20/20  11:31 AM    [facility] Physical  Therapy [address] [place], [place], [address] Phone: [phone]   Fax:  [phone]  Name: Bruce Robertson MRN: [id number] Date of Birth: [birth year]

## 2020-10-30 ENCOUNTER — Ambulatory Visit: Payer: Medicare HMO | Admitting: Family Medicine

## 2020-10-30 NOTE — Progress Notes (Deleted)
  Bruce Robertson DOB: 10-23-1961 Encounter date: 10/30/2020  This is a 59 y.o. male who presents with No chief complaint on file.   History of present illness: Last visit with me was 02/11/2020.  HPI   No Known Allergies No outpatient medications have been marked as taking for the 10/30/20 encounter (Appointment) with Caren Macadam, MD.    Review of Systems  Objective:  There were no vitals taken for this visit.      BP Readings from Last 3 Encounters:  09/21/20 96/66  09/18/20 101/64  09/11/20 100/70   Wt Readings from Last 3 Encounters:  09/21/20 220 lb (99.8 kg)  09/18/20 217 lb (98.4 kg)  09/11/20 224 lb 6.4 oz (101.8 kg)    Physical Exam  Assessment/Plan  There are no diagnoses linked to this encounter.       Micheline Rough, MD

## 2020-11-05 ENCOUNTER — Other Ambulatory Visit: Payer: Self-pay | Admitting: Cardiology

## 2020-11-08 ENCOUNTER — Other Ambulatory Visit: Payer: Self-pay | Admitting: Cardiology

## 2020-11-09 ENCOUNTER — Other Ambulatory Visit: Payer: Self-pay

## 2020-11-10 ENCOUNTER — Ambulatory Visit: Payer: Medicare HMO | Admitting: Family Medicine

## 2020-11-10 NOTE — Progress Notes (Deleted)
  Bruce Robertson DOB: 1961-04-15 Encounter date: 11/10/2020  This is a 60 y.o. male who presents with No chief complaint on file.   History of present illness: Recently underwent patellar tendon rupture repair. Last visit with me was 02/2020. HPI   No Known Allergies No outpatient medications have been marked as taking for the 11/10/20 encounter (Appointment) with Caren Macadam, MD.    Review of Systems  Objective:  There were no vitals taken for this visit.      BP Readings from Last 3 Encounters:  09/21/20 96/66  09/18/20 101/64  09/11/20 100/70   Wt Readings from Last 3 Encounters:  09/21/20 220 lb (99.8 kg)  09/18/20 217 lb (98.4 kg)  09/11/20 224 lb 6.4 oz (101.8 kg)    Physical Exam  Assessment/Plan  There are no diagnoses linked to this encounter.       Micheline Rough, MD

## 2020-11-14 ENCOUNTER — Other Ambulatory Visit: Payer: Self-pay

## 2020-11-14 ENCOUNTER — Ambulatory Visit (INDEPENDENT_AMBULATORY_CARE_PROVIDER_SITE_OTHER): Payer: Medicare HMO

## 2020-11-14 ENCOUNTER — Telehealth: Payer: Self-pay | Admitting: Family Medicine

## 2020-11-14 ENCOUNTER — Other Ambulatory Visit: Payer: Self-pay | Admitting: Family Medicine

## 2020-11-14 VITALS — BP 110/68 | HR 74 | Temp 97.7°F | Resp 20 | Wt 227.9 lb

## 2020-11-14 DIAGNOSIS — Z0000 Encounter for general adult medical examination without abnormal findings: Secondary | ICD-10-CM

## 2020-11-14 DIAGNOSIS — F339 Major depressive disorder, recurrent, unspecified: Secondary | ICD-10-CM | POA: Diagnosis not present

## 2020-11-14 MED ORDER — SILDENAFIL CITRATE 50 MG PO TABS: 50.0000 mg | ORAL_TABLET | Freq: Every day | ORAL | 5 refills | Status: DC | PRN

## 2020-11-14 MED ORDER — ENTRESTO 24-26 MG PO TABS
1.0000 | ORAL_TABLET | Freq: Two times a day (BID) | ORAL | 1 refills | Status: DC
Start: 2020-11-14 — End: 2021-07-13

## 2020-11-14 MED ORDER — APIXABAN 5 MG PO TABS
5.0000 mg | ORAL_TABLET | Freq: Two times a day (BID) | ORAL | 1 refills | Status: DC
Start: 2020-11-14 — End: 2020-12-05

## 2020-11-14 NOTE — Telephone Encounter (Signed)
done

## 2020-11-14 NOTE — Patient Instructions (Addendum)
Bruce Robertson , Thank you for taking time to come for your Medicare Wellness Visit. I appreciate your ongoing commitment to your health goals. Please review the following plan we discussed and let me know if I can assist you in the future.   Screening recommendations/referrals: Colonoscopy: Done 01/21/19 Recommended yearly ophthalmology/optometry visit for glaucoma screening and checkup Recommended yearly dental visit for hygiene and checkup  Vaccinations: Influenza vaccine: Declined and discussed Pneumococcal vaccine: PCV13 11/25/18 Tdap vaccine: declined and discussed Shingles vaccine: Shingrix discussed. Please contact your pharmacy for coverage information.    Covid-19: completed 7/9 & 06/03/20  Advanced directives: Advance directive discussed with you today. I have provided a copy for you to complete at home and have notarized. Once this is complete please bring a copy in to our office so we can scan it into your chart.   Conditions/risks identified: Maintain health  Next appointment: Follow up in one year for your annual wellness visit.   Preventive Care 30 Years and Older, Male Preventive care refers to lifestyle choices and visits with your health care provider that can promote health and wellness. What does preventive care include?  A yearly physical exam. This is also called an annual well check.  Dental exams once or twice a year.  Routine eye exams. Ask your health care provider how often you should have your eyes checked.  Personal lifestyle choices, including:  Daily care of your teeth and gums.  Regular physical activity.  Eating a healthy diet.  Avoiding tobacco and drug use.  Limiting alcohol use.  Practicing safe sex.  Taking low doses of aspirin every day.  Taking vitamin and mineral supplements as recommended by your health care provider. What happens during an annual well check? The services and screenings done by your health care provider during  your annual well check will depend on your age, overall health, lifestyle risk factors, and family history of disease. Counseling  Your health care provider may ask you questions about your:  Alcohol use.  Tobacco use.  Drug use.  Emotional well-being.  Home and relationship well-being.  Sexual activity.  Eating habits.  History of falls.  Memory and ability to understand (cognition).  Work and work Statistician. Screening  You may have the following tests or measurements:  Height, weight, and BMI.  Blood pressure.  Lipid and cholesterol levels. These may be checked every 5 years, or more frequently if you are over 16 years old.  Skin check.  Lung cancer screening. You may have this screening every year starting at age 44 if you have a 30-pack-year history of smoking and currently smoke or have quit within the past 15 years.  Fecal occult blood test (FOBT) of the stool. You may have this test every year starting at age 63.  Flexible sigmoidoscopy or colonoscopy. You may have a sigmoidoscopy every 5 years or a colonoscopy every 10 years starting at age 50.  Prostate cancer screening. Recommendations will vary depending on your family history and other risks.  Hepatitis C blood test.  Hepatitis B blood test.  Sexually transmitted disease (STD) testing.  Diabetes screening. This is done by checking your blood sugar (glucose) after you have not eaten for a while (fasting). You may have this done every 1-3 years.  Abdominal aortic aneurysm (AAA) screening. You may need this if you are a current or former smoker.  Osteoporosis. You may be screened starting at age 61 if you are at high risk. Talk with your health care  provider about your test results, treatment options, and if necessary, the need for more tests. Vaccines  Your health care provider may recommend certain vaccines, such as:  Influenza vaccine. This is recommended every year.  Tetanus, diphtheria, and  acellular pertussis (Tdap, Td) vaccine. You may need a Td booster every 10 years.  Zoster vaccine. You may need this after age 48.  Pneumococcal 13-valent conjugate (PCV13) vaccine. One dose is recommended after age 25.  Pneumococcal polysaccharide (PPSV23) vaccine. One dose is recommended after age 28. Talk to your health care provider about which screenings and vaccines you need and how often you need them. This information is not intended to replace advice given to you by your health care provider. Make sure you discuss any questions you have with your health care provider. Document Released: 11/17/2015 Document Revised: 07/10/2016 Document Reviewed: 08/22/2015 Elsevier Interactive Patient Education  2017 [place] Prevention in the Home Falls can cause injuries. They can happen to people of all ages. There are many things you can do to make your home safe and to help prevent falls. What can I do on the outside of my home?  Regularly fix the edges of walkways and driveways and fix any cracks.  Remove anything that might make you trip as you walk through a door, such as a raised step or threshold.  Trim any bushes or trees on the path to your home.  Use bright outdoor lighting.  Clear any walking paths of anything that might make someone trip, such as rocks or tools.  Regularly check to see if handrails are loose or broken. Make sure that both sides of any steps have handrails.  Any raised decks and porches should have guardrails on the edges.  Have any leaves, snow, or ice cleared regularly.  Use sand or salt on walking paths during winter.  Clean up any spills in your garage right away. This includes oil or grease spills. What can I do in the bathroom?  Use night lights.  Install grab bars by the toilet and in the tub and shower. Do not use towel bars as grab bars.  Use non-skid mats or decals in the tub or shower.  If you need to sit down in the shower, use  a plastic, non-slip stool.  Keep the floor dry. Clean up any water that spills on the floor as soon as it happens.  Remove soap buildup in the tub or shower regularly.  Attach bath mats securely with double-sided non-slip rug tape.  Do not have throw rugs and other things on the floor that can make you trip. What can I do in the bedroom?  Use night lights.  Make sure that you have a light by your bed that is easy to reach.  Do not use any sheets or blankets that are too big for your bed. They should not hang down onto the floor.  Have a firm chair that has side arms. You can use this for support while you get dressed.  Do not have throw rugs and other things on the floor that can make you trip. What can I do in the kitchen?  Clean up any spills right away.  Avoid walking on wet floors.  Keep items that you use a lot in easy-to-reach places.  If you need to reach something above you, use a strong step stool that has a grab bar.  Keep electrical cords out of the way.  Do not use floor polish  or wax that makes floors slippery. If you must use wax, use non-skid floor wax.  Do not have throw rugs and other things on the floor that can make you trip. What can I do with my stairs?  Do not leave any items on the stairs.  Make sure that there are handrails on both sides of the stairs and use them. Fix handrails that are broken or loose. Make sure that handrails are as long as the stairways.  Check any carpeting to make sure that it is firmly attached to the stairs. Fix any carpet that is loose or worn.  Avoid having throw rugs at the top or bottom of the stairs. If you do have throw rugs, attach them to the floor with carpet tape.  Make sure that you have a light switch at the top of the stairs and the bottom of the stairs. If you do not have them, ask someone to add them for you. What else can I do to help prevent falls?  Wear shoes that:  Do not have high heels.  Have  rubber bottoms.  Are comfortable and fit you well.  Are closed at the toe. Do not wear sandals.  If you use a stepladder:  Make sure that it is fully opened. Do not climb a closed stepladder.  Make sure that both sides of the stepladder are locked into place.  Ask someone to hold it for you, if possible.  Clearly mark and make sure that you can see:  Any grab bars or handrails.  First and last steps.  Where the edge of each step is.  Use tools that help you move around (mobility aids) if they are needed. These include:  Canes.  Walkers.  Scooters.  Crutches.  Turn on the lights when you go into a dark area. Replace any light bulbs as soon as they burn out.  Set up your furniture so you have a clear path. Avoid moving your furniture around.  If any of your floors are uneven, fix them.  If there are any pets around you, be aware of where they are.  Review your medicines with your doctor. Some medicines can make you feel dizzy. This can increase your chance of falling. Ask your doctor what other things that you can do to help prevent falls. This information is not intended to replace advice given to you by your health care provider. Make sure you discuss any questions you have with your health care provider. Document Released: 08/17/2009 Document Revised: 03/28/2016 Document Reviewed: 11/25/2014 Elsevier Interactive Patient Education  2017 [employer].

## 2020-11-14 NOTE — Progress Notes (Signed)
Subjective:   Bruce Robertson is a 60 y.o. male who presents for an Initial Medicare Annual Wellness Visit.  Review of Systems     Cardiac Risk Factors include: advanced age (>76mn, >>73women);dyslipidemia;hypertension;male gender;obesity (BMI >30kg/m2)     Objective:    Today's Vitals   11/14/20 0854  BP: 110/68  Pulse: 74  Resp: 20  Temp: 97.7 F (36.5 C)  SpO2: 99%  Weight: 227 lb 14.4 oz (103.4 kg)   Body mass index is 30.91 kg/m.  Advanced Directives 11/14/2020 09/21/2020 08/15/2020 05/23/2020 01/21/2019 06/10/2018 03/10/2018  Does Patient Have a Medical Advance Directive? No No No No No No No  Does patient want to make changes to medical advance directive? - - - - - - -  Would patient like information on creating a medical advance directive? Yes (MAU/Ambulatory/Procedural Areas - Information given) No - Patient declined - No - Patient declined No - Patient declined No - Patient declined No - Patient declined    Current Medications (verified) Outpatient Encounter Medications as of 11/14/2020  Medication Sig  . ACCU-CHEK GUIDE test strip   . Accu-Chek Softclix Lancets lancets   . blood glucose meter kit and supplies KIT Dispense based on patient and insurance preference. Use up to four times daily as directed.  . carvedilol (COREG) 12.5 MG tablet Take 1 tablet (12.5 mg total) by mouth 2 (two) times daily.  .Marland KitchenELIQUIS 5 MG TABS tablet Take 1 tablet by mouth twice daily (Patient taking differently: Take 5 mg by mouth 2 (two) times daily.)  . JARDIANCE 10 MG TABS tablet Take 10 mg by mouth daily.   . Multiple Vitamin (MULTIVITAMIN WITH MINERALS) TABS tablet Take 1 tablet by mouth daily.  .Marland Kitchenomeprazole (PRILOSEC) 40 MG capsule Take 40 mg by mouth daily before breakfast.  . sacubitril-valsartan (ENTRESTO) 24-26 MG Take 1 tablet by mouth in the morning and at bedtime.   . sildenafil (VIAGRA) 50 MG tablet Take by mouth.  . simvastatin (ZOCOR) 40 MG tablet Take 40 mg by mouth at  bedtime.   .Marland Kitchenspironolactone (ALDACTONE) 25 MG tablet Take 25 mg by mouth daily.   .Marland Kitchentorsemide (DEMADEX) 20 MG tablet Take 2 tablets by mouth once daily  . [DISCONTINUED] HYDROcodone-acetaminophen (NORCO) 7.5-325 MG tablet Take 1-2 tablets by mouth daily as needed for moderate pain. (Patient not taking: Reported on 11/14/2020)  . [DISCONTINUED] ibuprofen (ADVIL) 200 MG tablet Take 400 mg by mouth every 6 (six) hours as needed for mild pain or moderate pain.  (Patient not taking: Reported on 11/14/2020)   No facility-administered encounter medications on file as of 11/14/2020.    Allergies (verified) Patient has no known allergies.   History: Past Medical History:  Diagnosis Date  . AICD (automatic cardioverter/defibrillator) present   . Anxiety 08/20/2016  . Cardiomyopathy    Nonischemic. EF has been about 45%. S/P CABG.;  b.  Echo 4/14: EF 25%, global HK with inf and mid apical AK, restrictive physiology with E/e' > 15 (elevated LV filling pressure), trivial AI/MR, mild to mod LAE, mild RVE, mild reduced RVSF, mild RAE, mod TR, PASP 74 (severe pulmonary HTN)   . CHF (congestive heart failure) ([facility]   . [place]F [place]ation ([facility] 07/31/2016  . Colon polyps 2002  . Coronary artery disease   . Diabetes mellitus without complication ([place]    Type II  . Dyslipidemia    hx (03/20/2017)  . GERD (gastroesophageal reflux disease)   . History of bleeding peptic ulcer   .  History of echocardiogram    Echo 10/16:  EF 25-30%, poss non-compaction, diff HK with inf-lat and apical HK, restrictive physio, mild AI, severe LAE, mild RVE with mild reduced RVSF, PASP 42 mmHg  . Hyperplastic rectal polyp   . Moderate episode of recurrent major depressive disorder ([place]) 08/20/2016   09/14/20 - not current  . OSA on CPAP    Mild  . Pneumonia 09/2018  . Tobacco abuse    Remote   Past Surgical History:  Procedure Laterality Date  . CARDIAC CATHETERIZATION  2007;  2009  . CARDIAC DEFIBRILLATOR PLACEMENT   03/20/2017  . COLONOSCOPY WITH PROPOFOL N/A 01/21/2019   Procedure: COLONOSCOPY WITH PROPOFOL;  Surgeon: Milus Banister, MD;  Location: [facility];  Service: Endoscopy;  Laterality: N/A;  . CORONARY ARTERY BYPASS GRAFT  2007   Had a left main dissection after catheterization. Underwent a saphenous vein graft to the LAD and a saphenous vein graft to obtuse marginal.  . HEMORRHOID SURGERY N/A 08/23/2020   Procedure: HEMORRHOIDECTOMY WITH LIGATION AND HEMORRHOIDOPEXY;  Surgeon: Michael Boston, MD;  Location: [facility];  Service: General;  Laterality: N/A;  . ICD IMPLANT N/A 03/20/2017   Procedure: ICD Implant;  Surgeon: Constance Haw, MD;  Location: [facility];  Service: Cardiovascular;  Laterality: N/A;  . PATELLAR TENDON REPAIR Right 2005  . PATELLAR TENDON REPAIR Right 09/21/2020   Procedure: PATELLA TENDON REPAIR;  Surgeon: Leandrew Koyanagi, MD;  Location: [facility];  Service: Orthopedics;  Laterality: Right;  . POLYPECTOMY  01/21/2019   Procedure: POLYPECTOMY;  Surgeon: Milus Banister, MD;  Location: [facility];  Service: Endoscopy;;  . RECTAL EXAM UNDER ANESTHESIA Left 08/23/2020   Procedure: RECTAL EXAM UNDER ANESTHESIA;  Surgeon: Michael Boston, MD;  Location: [facility];  Service: General;  Laterality: Left;   Family History  Problem Relation Age of Onset  . Hypertension Mother   . Osteoarthritis Mother   . Heart failure Father 21  . High blood pressure Father   . Lung cancer Other   . Cancer Maternal Grandmother        uncertain type  . Bipolar disorder Daughter   . Diabetes Daughter   . Cancer Maternal Uncle        uncertain type  . Diabetes Niece   . Asthma Neg Hx    Social History   Socioeconomic History  . Marital status: Divorced    Spouse name: Not on file  . Number of children: 5  . Years of education: Not on file  . Highest education level: Not on file  Occupational History  . Occupation: Civil engineer, contracting    Employer: [employer]  Tobacco Use  .  Smoking status: Former Smoker    Packs/day: 0.25    Years: 7.00    Pack years: 1.75  . Smokeless tobacco: Never Used  . Tobacco comment: 03/20/2017 "quit in 1997  Vaping Use  . Vaping Use: Former  . Devices: vaped for about a month  Substance and Sexual Activity  . Alcohol use: Not Currently    Alcohol/week: 0.0 standard drinks    Comment: twice a month no preference  . Drug use: Yes    Frequency: 1.0 times per week    Types: Marijuana    Comment: last time 09/15/20  . Sexual activity: Not Currently  Other Topics Concern  . Not on file  Social History Narrative   Single   Social Determinants of Health   Financial Resource Strain: Not  on file  Food Insecurity: Not on file  Transportation Needs: No Transportation Needs  . Lack of Transportation (Medical): No  . Lack of Transportation (Non-Medical): No  Physical Activity: Inactive  . Days of Exercise per Week: 0 days  . Minutes of Exercise per Session: 0 min  Stress: Stress Concern Present  . Feeling of Stress : To some extent  Social Connections: Moderately Isolated  . Frequency of Communication with Friends and Family: More than three times a week  . Frequency of Social Gatherings with Friends and Family: Never  . Attends Religious Services: More than 4 times per year  . Active Member of Clubs or Organizations: No  . Attends Archivist Meetings: Never  . Marital Status: Divorced    Tobacco Counseling Counseling given: Not Answered Comment: 03/20/2017 "quit in 1997   Clinical Intake:  Pre-visit preparation completed: Yes  Pain : No/denies pain     BMI - recorded: 30.91 Nutritional Status: BMI > 30  Obese Nutritional Risks: Other (Comment) Diabetes: No  How often do you need to have someone help you when you read instructions, pamphlets, or other written materials from your doctor or pharmacy?: 1 - Never  Diabetic?no  Interpreter Needed?: No  Information entered by :: Charlott Rakes,  LPN   Activities of Daily Living In your present state of health, do you have any difficulty performing the following activities: 11/14/2020 09/21/2020  Hearing? N N  Vision? N N  Difficulty concentrating or making decisions? Y N  Comment memory at times -  Walking or climbing stairs? Y N  Comment SOB at times -  Dressing or bathing? N N  Doing errands, shopping? N -  Preparing Food and eating ? N -  Using the Toilet? N -  In the past six months, have you accidently leaked urine? N -  Do you have problems with loss of bowel control? N -  Managing your Medications? N -  Managing your Finances? N -  Housekeeping or managing your Housekeeping? N -  Some recent data might be hidden    Patient Care Team: Caren Macadam, MD as PCP - General (Family Medicine) Constance Haw, MD as PCP - Electrophysiology (Cardiology) Minus Breeding, MD as PCP - [facility] Access (Cardiology) Minus Breeding, MD as PCP - Cardiology (Cardiology) Constance Haw, MD as Consulting Physician (Cardiology) Eli Hose, [facility] (Inactive) as Pharmacist (Pharmacist) Milus Banister, MD as Attending Physician (Gastroenterology) Michael Boston, MD as Consulting Physician (General Surgery) Riccardo Dubin, MD as Referring Physician (Cardiology)  Indicate any recent Medical Services you may have received from other than [facility] providers in the past year (date may be approximate).     Assessment:   This is a routine wellness examination for Vishaal.  Hearing/Vision screen  Hearing Screening   125Hz  250Hz  500Hz  1000Hz  2000Hz  3000Hz  4000Hz  6000Hz  8000Hz   Right ear:           Left ear:           Comments: Pt denies any hearing issues  Vision Screening Comments: Pt has not followed up in 1 or so. Pt will follow up this year [facility]  Dietary issues and exercise activities discussed: Current Exercise Habits: Home exercise routine, Type of exercise:  walking;strength training/weights;stretching, Time (Minutes): 60, Frequency (Times/Week): 2, Weekly Exercise (Minutes/Week): 120  Goals    . Chronic Care Management     CARE PLAN ENTRY  Current Barriers:  .  Chronic Disease Management support, education, and care coordination needs related to hypertension, hyperlipidemia, and heart failure   Hypertension/Heart Failure BP Readings from Last 3 Encounters:  04/27/20 90/62  03/28/20 100/62  03/15/20 116/70   . Pharmacist Clinical Goal(s): o Over the next 90 days, patient will work with PharmD and providers to maintain BP goal <130/80 . Current regimen:  . Carvedilol 12.5 mg 1 tablet twice daily . Spironolactone 25 mg 1 tablet daily . Torsemide 20 mg 2 tablets daily . Entresto 24-26 mg 1 tablet AM and HS . Interventions: o Discussed low salt diet and exercising as tolerated extensively . Patient self care activities - Over the next 90 days, patient will: o Check BP twice daily, document, and provide at future appointments o Ensure daily salt intake < 2300 mg/day o Check weight daily  Hyperlipidemia Lab Results  Component Value Date/Time   LDLCALC 92 02/11/2020 04:41 PM   LDLDIRECT 166.4 05/17/2010 09:05 AM   . Pharmacist Clinical Goal(s): o Over the next 90 days, patient will work with PharmD and providers to maintain LDL goal < 70 . Current regimen:  o Simvastatin 20 mg 1 tablet daily . Interventions: o Discussed low cholesterol diet and exercising as tolerated extensively . Patient self care activities - Over the next 90 days, patient will: o Continue with diet/weight management and exercise  Medication management . Pharmacist Clinical Goal(s): o Over the next 90 days, patient will work with PharmD and providers to maintain optimal medication adherence . Current pharmacy: [facility] . Interventions o Comprehensive medication review performed. o Continue current medication management strategy . Patient self care activities  - Over the next 90 days, patient will: o Focus on medication adherence by using weekly pill organizers o Take medications as prescribed o Report any questions or concerns to PharmD and/or provider(s)  Initial goal documentation     . Patient Stated     Stay healthy as possible      Depression Screen PHQ 2/9 Scores 11/14/2020  PHQ - 2 Score 1    Fall Risk Fall Risk  11/14/2020  Falls in the past year? 0  Number falls in past yr: 0  Injury with Fall? 0  Risk for fall due to : Impaired vision;Impaired balance/gait;Impaired mobility  Risk for fall due to: Comment related to kness and vertigo    FALL RISK PREVENTION PERTAINING TO THE HOME:  Any stairs in or around the home? Yes  If so, are there any without handrails? No  Home free of loose throw rugs in walkways, pet beds, electrical cords, etc? Yes  Adequate lighting in your home to reduce risk of falls? Yes   ASSISTIVE DEVICES UTILIZED TO PREVENT FALLS:  Life alert? No  Use of a cane, walker or w/c? No  Grab bars in the bathroom? Yes  Shower chair or bench in shower? Yes  Elevated toilet seat or a handicapped toilet? No   TIMED UP AND GO:  Was the test performed? Yes .  Length of time to ambulate 10 feet: 10 sec.   Gait steady and fast without use of assistive device  Cognitive Function:     6CIT Screen 11/14/2020  What Year? 0 points  What month? 0 points  Count back from 20 0 points  Months in reverse 4 points  Repeat phrase 0 points    Immunizations Immunization History  Administered Date(s) Administered  . Influenza,inj,Quad PF,6+ Mos 11/25/2018  . Influenza-Unspecified 08/23/2019  . PFIZER SARS-COV-2 Vaccination 05/12/2020, 06/03/2020  .  Pneumococcal Conjugate-13 11/25/2018    TDAP status: Due, Education has been provided regarding the importance of this vaccine. Advised may receive this vaccine at local pharmacy or Health Dept. Aware to provide a copy of the vaccination record if obtained from  local pharmacy or Health Dept. Verbalized acceptance and understanding.  Flu Vaccine status: Declined, Education has been provided regarding the importance of this vaccine but patient still declined. Advised may receive this vaccine at local pharmacy or Health Dept. Aware to provide a copy of the vaccination record if obtained from local pharmacy or Health Dept. Verbalized acceptance and understanding.  Pneumococcal vaccine status: Up to date  Covid-19 vaccine status: Completed vaccines  Qualifies for Shingles Vaccine? Yes   Zostavax completed No   Shingrix Completed?: No.    Education has been provided regarding the importance of this vaccine. Patient has been advised to call insurance company to determine out of pocket expense if they have not yet received this vaccine. Advised may also receive vaccine at local pharmacy or Health Dept. Verbalized acceptance and understanding.  Screening Tests Health Maintenance  Topic Date Due  . TETANUS/TDAP  Never done  . INFLUENZA VACCINE  06/04/2020  . COVID-19 Vaccine (3 - Booster for Pfizer series) 12/04/2020  . URINE MICROALBUMIN  02/10/2021  . COLONOSCOPY (Pts 45-7yr Insurance coverage will need to be confirmed)  01/20/2029  . Hepatitis C Screening  Completed  . HIV Screening  Completed    Health Maintenance  Health Maintenance Due  Topic Date Due  . TETANUS/TDAP  Never done  . INFLUENZA VACCINE  06/04/2020    Colorectal cancer screening: Type of screening: Colonoscopy. Completed 01/21/19. Repeat every 10 years   Additional Screening:  Hepatitis C Screening:  Completed 07/31/16  Vision Screening: Recommended annual ophthalmology exams for early detection of glaucoma and other disorders of the eye. Is the patient up to date with their annual eye exam?  No Who is the provider or what is the name of the office in which the patient attends annual eye exams? [facility] will follow up   Dental Screening: Recommended annual dental  exams for proper oral hygiene  Community Resource Referral / Chronic Care Management: CRR required this visit?  No   CCM required this visit?  No      Plan:     I have personally reviewed and noted the following in the patient's chart:   . Medical and social history . Use of alcohol, tobacco or illicit drugs  . Current medications and supplements . Functional ability and status . Nutritional status . Physical activity . Advanced directives . List of other physicians . Hospitalizations, surgeries, and ER visits in previous 12 months . Vitals . Screenings to include cognitive, depression, and falls . Referrals and appointments  In addition, I have reviewed and discussed with patient certain preventive protocols, quality metrics, and best practice recommendations. A written personalized care plan for preventive services as well as general preventive health recommendations were provided to patient.     TWillette Brace LPN   19/01/91  Nurse Notes: Pt is requesting refill on medications Eliquis, entresto and viagara [email or web address]. A note was also sent to PCP to address he has an appt with her on 12/15/20 to discuss muscle spasm at night.

## 2020-11-14 NOTE — Telephone Encounter (Signed)
Patient needs refills for Eliquis, Entresto and Viagra.  Pharmacy-- [facility] [address]

## 2020-11-15 ENCOUNTER — Telehealth: Payer: Self-pay

## 2020-11-15 NOTE — Telephone Encounter (Signed)
    MA1/10/2021 1st Attempt  Name: Bruce Robertson   MRN: [id number]   DOB: [birth year]   AGE: 60 y.o.   GENDER: male   PCP Caren Macadam, MD.   Bruce Robertson is a 60 year old male who is a primary care patient of Koberlein, Steele Berg, MD . The community resource team was consulted for assistance with Counseling resources  Care guide performed the following interventions: Patient provided with information about care guide support team and interviewed to confirm resource needs Discussed resources to assist with mental health counseling. Patient received emailed resources.  Follow Up Plan:  Care guide will follow up with patient by phone over the next 7 days  SIG Ambrose Mantle [phone]      Aarav Burgett Andree Elk, AAS Paralegal, [place] . Embedded Care Coordination [facility]  Care Management  [address], [place] [address] [email or web address]  [phone]   [email or web address]

## 2020-11-16 ENCOUNTER — Ambulatory Visit (INDEPENDENT_AMBULATORY_CARE_PROVIDER_SITE_OTHER): Payer: Medicare HMO

## 2020-11-16 DIAGNOSIS — I428 Other cardiomyopathies: Secondary | ICD-10-CM

## 2020-11-16 LAB — CUP PACEART REMOTE DEVICE CHECK
Battery Remaining Longevity: 99 mo
Battery Voltage: 3 V
Brady Statistic RV Percent Paced: 0.44 %
Date Time Interrogation Session: 20220113043622
HighPow Impedance: 57 Ohm
Implantable Lead Implant Date: 20180517
Implantable Lead Location: 753860
Implantable Pulse Generator Implant Date: 20180517
Lead Channel Impedance Value: 342 Ohm
Lead Channel Impedance Value: 399 Ohm
Lead Channel Pacing Threshold Amplitude: 0.625 V
Lead Channel Pacing Threshold Pulse Width: 0.4 ms
Lead Channel Sensing Intrinsic Amplitude: 11.5 mV
Lead Channel Sensing Intrinsic Amplitude: 11.5 mV
Lead Channel Setting Pacing Amplitude: 2.5 V
Lead Channel Setting Pacing Pulse Width: 0.4 ms
Lead Channel Setting Sensing Sensitivity: 0.3 mV

## 2020-11-21 ENCOUNTER — Telehealth: Payer: Self-pay

## 2020-11-21 NOTE — Telephone Encounter (Signed)
   Telephone encounter was:  Successful.  11/21/2020 Name: Greggory Safranek MRN: [id number] DOB: February 19, 1961  Samer Dutton is a 60 y.o. year old male who is a primary care patient of Caren Macadam, MD . The community resource team was consulted for assistance with Counseling resources  Care guide performed the following interventions: Follow up call placed to the patient to discuss status of referral Patient received emailed counseling resources and has not other needs at this time..  Follow Up Plan:  No further follow up planned at this time. The patient has been provided with needed resources. and closing referral.  Haeli Gerlich, AAS Paralegal, [place] . Embedded Care Coordination [facility]  Care Management  [address], [place] [address] ??[email or web address]  ?? [phone]   [email or web address]

## 2020-11-30 ENCOUNTER — Other Ambulatory Visit: Payer: Self-pay

## 2020-11-30 NOTE — Progress Notes (Signed)
Remote ICD transmission.   

## 2020-12-01 ENCOUNTER — Ambulatory Visit (INDEPENDENT_AMBULATORY_CARE_PROVIDER_SITE_OTHER): Payer: Medicare HMO | Admitting: Family Medicine

## 2020-12-01 ENCOUNTER — Encounter: Payer: Self-pay | Admitting: Family Medicine

## 2020-12-01 VITALS — BP 100/64 | HR 69 | Temp 97.9°F | Ht 72.0 in | Wt 229.1 lb

## 2020-12-01 DIAGNOSIS — R42 Dizziness and giddiness: Secondary | ICD-10-CM

## 2020-12-01 DIAGNOSIS — E538 Deficiency of other specified B group vitamins: Secondary | ICD-10-CM | POA: Diagnosis not present

## 2020-12-01 DIAGNOSIS — E782 Mixed hyperlipidemia: Secondary | ICD-10-CM

## 2020-12-01 DIAGNOSIS — I420 Dilated cardiomyopathy: Secondary | ICD-10-CM

## 2020-12-01 DIAGNOSIS — H9201 Otalgia, right ear: Secondary | ICD-10-CM

## 2020-12-01 DIAGNOSIS — R5383 Other fatigue: Secondary | ICD-10-CM

## 2020-12-01 DIAGNOSIS — I10 Essential (primary) hypertension: Secondary | ICD-10-CM

## 2020-12-01 DIAGNOSIS — R739 Hyperglycemia, unspecified: Secondary | ICD-10-CM

## 2020-12-01 DIAGNOSIS — E559 Vitamin D deficiency, unspecified: Secondary | ICD-10-CM

## 2020-12-01 DIAGNOSIS — R350 Frequency of micturition: Secondary | ICD-10-CM | POA: Diagnosis not present

## 2020-12-01 DIAGNOSIS — H9311 Tinnitus, right ear: Secondary | ICD-10-CM

## 2020-12-01 DIAGNOSIS — R0602 Shortness of breath: Secondary | ICD-10-CM

## 2020-12-01 DIAGNOSIS — I480 Paroxysmal atrial fibrillation: Secondary | ICD-10-CM

## 2020-12-01 DIAGNOSIS — N1831 Chronic kidney disease, stage 3a: Secondary | ICD-10-CM

## 2020-12-01 DIAGNOSIS — E611 Iron deficiency: Secondary | ICD-10-CM | POA: Diagnosis not present

## 2020-12-01 LAB — CBC WITH DIFFERENTIAL/PLATELET
Basophils Absolute: 0 10*3/uL (ref 0.0–0.1)
Basophils Relative: 0.3 % (ref 0.0–3.0)
Eosinophils Absolute: 0 10*3/uL (ref 0.0–0.7)
Eosinophils Relative: 0.6 % (ref 0.0–5.0)
HCT: 40.1 % (ref 39.0–52.0)
Hemoglobin: 12.3 g/dL — ABNORMAL LOW (ref 13.0–17.0)
Lymphocytes Relative: 26.8 % (ref 12.0–46.0)
Lymphs Abs: 2 10*3/uL (ref 0.7–4.0)
MCHC: 30.7 g/dL (ref 30.0–36.0)
MCV: 71.1 fl — ABNORMAL LOW (ref 78.0–100.0)
Monocytes Absolute: 0.7 10*3/uL (ref 0.1–1.0)
Monocytes Relative: 9.2 % (ref 3.0–12.0)
Neutro Abs: 4.7 10*3/uL (ref 1.4–7.7)
Neutrophils Relative %: 63.1 % (ref 43.0–77.0)
Platelets: 190 10*3/uL (ref 150.0–400.0)
RBC: 5.64 Mil/uL (ref 4.22–5.81)
RDW: 19 % — ABNORMAL HIGH (ref 11.5–15.5)
WBC: 7.4 10*3/uL (ref 4.0–10.5)

## 2020-12-01 LAB — URINALYSIS
Bilirubin Urine: NEGATIVE
Hgb urine dipstick: NEGATIVE
Ketones, ur: NEGATIVE
Leukocytes,Ua: NEGATIVE
Nitrite: NEGATIVE
Specific Gravity, Urine: 1.01 (ref 1.000–1.030)
Total Protein, Urine: NEGATIVE
Urine Glucose: 500 — AB
Urobilinogen, UA: 0.2 (ref 0.0–1.0)
pH: 7 (ref 5.0–8.0)

## 2020-12-01 LAB — COMPREHENSIVE METABOLIC PANEL
ALT: 29 U/L (ref 0–53)
AST: 23 U/L (ref 0–37)
Albumin: 4.5 g/dL (ref 3.5–5.2)
Alkaline Phosphatase: 79 U/L (ref 39–117)
BUN: 21 mg/dL (ref 6–23)
CO2: 34 mEq/L — ABNORMAL HIGH (ref 19–32)
Calcium: 10 mg/dL (ref 8.4–10.5)
Chloride: 98 mEq/L (ref 96–112)
Creatinine, Ser: 1.72 mg/dL — ABNORMAL HIGH (ref 0.40–1.50)
GFR: 43.46 mL/min — ABNORMAL LOW (ref >60.00)
Glucose, Bld: 95 mg/dL (ref 70–99)
Potassium: 4.1 mEq/L (ref 3.5–5.1)
Sodium: 140 mEq/L (ref 135–145)
Total Bilirubin: 1.1 mg/dL (ref 0.2–1.2)
Total Protein: 7.5 g/dL (ref 6.0–8.3)

## 2020-12-01 LAB — LIPID PANEL
Cholesterol: 142 mg/dL (ref 0–200)
HDL: 44.9 mg/dL (ref >39.00)
LDL Cholesterol: 79 mg/dL (ref 0–99)
NonHDL: 96.87
Total CHOL/HDL Ratio: 3
Triglycerides: 88 mg/dL (ref 0.0–149.0)
VLDL: 17.6 mg/dL (ref 0.0–40.0)

## 2020-12-01 LAB — VITAMIN D 25 HYDROXY (VIT D DEFICIENCY, FRACTURES): VITD: 32.91 ng/mL (ref 30.00–100.00)

## 2020-12-01 LAB — IBC + FERRITIN
Ferritin: 14.3 ng/mL — ABNORMAL LOW (ref 22.0–322.0)
Iron: 36 ug/dL — ABNORMAL LOW (ref 42–165)
Saturation Ratios: 6.4 % — ABNORMAL LOW (ref 20.0–50.0)
Transferrin: 402 mg/dL — ABNORMAL HIGH (ref 212.0–360.0)

## 2020-12-01 LAB — HEMOGLOBIN A1C: Hgb A1c MFr Bld: 6.7 % — ABNORMAL HIGH (ref 4.6–6.5)

## 2020-12-01 LAB — MICROALBUMIN / CREATININE URINE RATIO
Creatinine,U: 9.8 mg/dL
Microalb Creat Ratio: 7.2 mg/g (ref 0.0–30.0)
Microalb, Ur: <0.7 mg/dL (ref 0.0–1.9)

## 2020-12-01 LAB — BRAIN NATRIURETIC PEPTIDE: Pro B Natriuretic peptide (BNP): 1097 pg/mL — ABNORMAL HIGH (ref 0.0–100.0)

## 2020-12-01 LAB — IRON: Iron: 36 ug/dL — ABNORMAL LOW (ref 42–165)

## 2020-12-01 LAB — TSH: TSH: 2.62 u[IU]/mL (ref 0.35–4.50)

## 2020-12-01 LAB — VITAMIN B12: Vitamin B-12: >1526 pg/mL — ABNORMAL HIGH (ref 211–911)

## 2020-12-01 NOTE — Progress Notes (Signed)
Bruce Robertson DOB: [birth year] Encounter date: 12/01/2020  This is a 60 y.o. male who presents with Chief Complaint  Patient presents with  . Medication Refill    History of present illness: He is doing well s/p patellar tendon rupture.  One concern today: thinks he has UTI from jardiance. Occasionally has discharge and there is odor to it. Not painful. There is some swelling around head of penis. Started jardiance through cardiology - thinks 4-5 months ago.   Has had pain in neck for about a month - on right side; not sure what he did but it's not going away. Last month or so - balance has been off. Running into things; bending over to get something and losing balance. No pain in ears, but pain behind right ear. Has had ringing in ear - right ear justa couple of times - sounds like dial tone; right around same timeline. No sinus sx otherwise. Has occasional headache - always on right side; this is new. Tried valium which eased pain/helped him sleep, but didn't change overall pain. Has been 10/10 pain, but right now about 2/10. Gets dizzy occasionally - notes with standing too fast.   Knot on right side he is still worried about. Doesn't move, not painful. Doesn't move. But getting bigger.   Feeling really tired. Notes worse with increased salt intake.    No Known Allergies Current Meds  Medication Sig  . ACCU-CHEK GUIDE test strip   . Accu-Chek Softclix Lancets lancets   . apixaban (ELIQUIS) 5 MG TABS tablet Take 1 tablet (5 mg total) by mouth 2 (two) times daily.  . blood glucose meter kit and supplies KIT Dispense based on patient and insurance preference. Use up to four times daily as directed.  . carvedilol (COREG) 12.5 MG tablet Take 1 tablet (12.5 mg total) by mouth 2 (two) times daily.  Marland Kitchen JARDIANCE 10 MG TABS tablet Take 10 mg by mouth daily.   . Multiple Vitamin (MULTIVITAMIN WITH MINERALS) TABS tablet Take 1 tablet by mouth daily.  Marland Kitchen omeprazole (PRILOSEC) 40 MG capsule Take  40 mg by mouth daily before breakfast.  . sacubitril-valsartan (ENTRESTO) 24-26 MG Take 1 tablet by mouth in the morning and at bedtime.  . sildenafil (VIAGRA) 50 MG tablet Take 1 tablet (50 mg total) by mouth daily as needed for erectile dysfunction.  . simvastatin (ZOCOR) 40 MG tablet Take 40 mg by mouth at bedtime.   Marland Kitchen spironolactone (ALDACTONE) 25 MG tablet Take 25 mg by mouth daily.   Marland Kitchen torsemide (DEMADEX) 20 MG tablet Take 2 tablets by mouth once daily    Review of Systems  Constitutional: Positive for fatigue. Negative for activity change, appetite change, chills, fever and unexpected weight change.  HENT: Negative for congestion, ear pain (pain behind right ear), hearing loss, sinus pressure, sinus pain, sore throat and trouble swallowing.   Eyes: Negative for pain and visual disturbance.  Respiratory: Negative for cough, chest tightness, shortness of breath and wheezing.   Cardiovascular: Negative for chest pain, palpitations and leg swelling.  Gastrointestinal: Negative for abdominal distention, abdominal pain, blood in stool, constipation, diarrhea, nausea and vomiting.  Genitourinary: Negative for decreased urine volume, difficulty urinating, dysuria, penile pain and testicular pain.       See hpi; no concern for STD. Occasionally notes some erythema head of penis.  Musculoskeletal: Positive for neck pain (more posterior ear with radiation to neck). Negative for arthralgias, back pain and joint swelling.  Skin: Negative for rash.  Neurological:  Positive for dizziness and headaches (right sided). Negative for weakness and numbness.  Hematological: Negative for adenopathy. Does not bruise/bleed easily.  Psychiatric/Behavioral: Negative for agitation, sleep disturbance and suicidal ideas. The patient is not nervous/anxious.     Objective:  BP 100/64 (BP Location: Left Arm, Patient Position: Sitting, Cuff Size: Large)   Pulse 69   Temp 97.9 F (36.6 C) (Oral)   Ht 6' (1.829 m)    Wt 229 lb 1.6 oz (103.9 kg)   BMI 31.07 kg/m   Weight: 229 lb 1.6 oz (103.9 kg)   BP Readings from Last 3 Encounters:  12/01/20 100/64  11/14/20 110/68  09/21/20 96/66   Wt Readings from Last 3 Encounters:  12/01/20 229 lb 1.6 oz (103.9 kg)  11/14/20 227 lb 14.4 oz (103.4 kg)  09/21/20 220 lb (99.8 kg)    Physical Exam Constitutional:      General: He is not in acute distress.    Appearance: He is well-developed.  HENT:     Head: Normocephalic.      Comments: Tenderness in marked area; TMJ tenderness on right; less prominnent than tenderness in marked area. No noted significant edema. Not significant pain with ROM of neck.     Right Ear: Tympanic membrane and ear canal normal.     Left Ear: Tympanic membrane and ear canal normal.  Cardiovascular:     Rate and Rhythm: Normal rate and regular rhythm.     Heart sounds: Normal heart sounds. No murmur heard. No friction rub.  Pulmonary:     Effort: Pulmonary effort is normal. No respiratory distress.     Breath sounds: Normal breath sounds. No wheezing or rales.  Chest:     Comments: Lipoma suspected (soft, irregular border mass) right mid axillary line approx 4x3cm. Nontender.  Genitourinary:    Penis: Normal and uncircumcised.      Testes: Normal.  Musculoskeletal:     Right lower leg: No edema.     Left lower leg: No edema.  Neurological:     Mental Status: He is alert and oriented to person, place, and time.  Psychiatric:        Behavior: Behavior normal.     Assessment/Plan  1. Fatigue, unspecified type Will start with bloodwork; further eval pending this.   2. Cardiomyopathy, dilated, nonischemic ([place]) He does follow regularly with cardiology.  Blood pressure and fluid status has been well controlled.  Currently on Entresto 24-26 mg daily, spironolactone 25 mg daily, Demadex 40 mg daily, Coreg 12.5 mg twice daily.  3. Essential hypertension Well-controlled.  Continue current medications (see above) - CBC  with Differential/Platelet; Future - CBC with Differential/Platelet  4. Paroxysmal atrial fibrillation ([place]) Follows with cardiology.  He is on Eliquis 5 mg twice daily.  5. Mixed hyperlipidemia Lipids have been well controlled.  Continue Zocor 40 mg daily. - Comprehensive metabolic panel; Future - Lipid panel; Future - TSH; Future - Comprehensive metabolic panel - Lipid panel - TSH  6. Stage 3a chronic kidney disease (HCC) Kidney function is stable.  We will recheck blood work today.  7. Shortness of breath He does have some shortness of breath at baseline.  He does feel like this is slightly worse recently, he notes worsening with increased salt intake.  We will start with blood work.  He will be due in a couple of months for repeat CT to follow-up on a right pulmonary nodule (appeared benign previously) - Brain natriuretic peptide; Future - Brain natriuretic peptide  8. Hyperglycemia - Hemoglobin A1c; Future - Microalbumin / creatinine urine ratio; Future - Hemoglobin A1c - Microalbumin / creatinine urine ratio  9. B12 deficiency - Vitamin B12; Future - Vitamin B12  10. Vitamin D deficiency - VITAMIN D 25 Hydroxy (Vit-D Deficiency, Fractures); Future - VITAMIN D 25 Hydroxy (Vit-D Deficiency, Fractures)  11. Iron deficiency - IBC + Ferritin; Future - Iron; Future - IBC + Ferritin - Iron  12. Urinary frequency Start with urinalysis.  Did not appear to have any ongoing infection on exam today (he is aware of increased risk of yeast infection with Jardiance).  If continuing to have urinary symptoms, we could talk with cardiology about holding the [place].  - Urinalysis; Future - Urinalysis  13. Posterior auricular pain of right ear Concern with new symptoms including dizziness, right-sided tinnitus, right-sided headache, and right posterior ear pain.  I do feel this warrants imaging for further evaluation. - CT TEMPORAL BONES WO CONTRAST; Future  14. Tinnitus,  right - CT TEMPORAL BONES WO CONTRAST; Future  15. Dizziness - CT TEMPORAL BONES WO CONTRAST; Future    Return for pending labs/imaging. 42 minutes spent in chart review, time with patient, exam, charting.    Micheline Rough, MD

## 2020-12-02 ENCOUNTER — Encounter: Payer: Self-pay | Admitting: Family Medicine

## 2020-12-05 ENCOUNTER — Other Ambulatory Visit (HOSPITAL_COMMUNITY): Payer: Self-pay | Admitting: Cardiology

## 2020-12-05 ENCOUNTER — Other Ambulatory Visit: Payer: Self-pay | Admitting: Internal Medicine

## 2020-12-12 ENCOUNTER — Other Ambulatory Visit: Payer: Self-pay | Admitting: Family Medicine

## 2020-12-12 DIAGNOSIS — Z3009 Encounter for other general counseling and advice on contraception: Secondary | ICD-10-CM

## 2020-12-15 ENCOUNTER — Ambulatory Visit: Payer: Medicare HMO | Admitting: Family Medicine

## 2020-12-18 ENCOUNTER — Other Ambulatory Visit: Payer: Self-pay

## 2020-12-18 ENCOUNTER — Ambulatory Visit
Admission: RE | Admit: 2020-12-18 | Discharge: 2020-12-18 | Disposition: A | Discharge status: Home / Self Care | Payer: Medicare HMO | Source: Ambulatory Visit | Attending: Family Medicine | Admitting: Family Medicine

## 2020-12-18 DIAGNOSIS — H9311 Tinnitus, right ear: Secondary | ICD-10-CM | POA: Diagnosis not present

## 2020-12-18 DIAGNOSIS — R42 Dizziness and giddiness: Secondary | ICD-10-CM

## 2020-12-18 DIAGNOSIS — H9201 Otalgia, right ear: Secondary | ICD-10-CM

## 2020-12-21 NOTE — Progress Notes (Signed)
Cardiology Office Note   Date:  12/22/2020   ID:  Davit Vassar, DOB [birth year], MRN [id number]  PCP:  Caren Macadam, MD  Cardiologist:   Minus Breeding, MD   Chief Complaint  Patient presents with  . Dizziness      History of Present Illness: Bruce Robertson is a 60 y.o. 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  His course over the years has been difficult because of social issues, occasional inability to obtain meds and inconsistent follow up with appts.   He has been seen at [facility] by the Heart Failure Service.   The HF Clinic at [facility] suggested possible non compaction.  His last EF was 15 -20.  However, he responded well to therapy and seemed to be tolerating med titration with Entresto.   He also has had some GI bleeding but this was related to hemorrhoids.  He has been seen by GI and has been cleared to be on anticoagulation.     He has been doing much better on the meds as titrated below although he began having some recent dizziness.  In fact he had one episode recently where he stood up suddenly and almost passed out.  He took about 10 steps and broke into a cold sweat.  He had to be eased to the ground.  He is having more dizzy episodes and his blood pressure was low as reported.  He is also been having a little discomfort when he urinates and he wonders if that could be related to the [place].  He is not having any fevers or chills.  He says he does get occasionally short of breath but he is much better than he wants.  Is not having chronic dyspnea.  He wears a CPAP at night and he is not describing any PND or orthopnea.  He is not having any palpitations.  He has no chest pressure, neck or arm discomfort.   Past Medical History:  Diagnosis Date  . AICD (automatic  cardioverter/defibrillator) present   . Anxiety 08/20/2016  . Cardiomyopathy    Nonischemic. EF has been about 45%. S/P CABG.;  b.  Echo 4/14: EF 25%, global HK with inf and mid apical AK, restrictive physiology with E/e' > 15 (elevated LV filling pressure), trivial AI/MR, mild to mod LAE, mild RVE, mild reduced RVSF, mild RAE, mod TR, PASP 74 (severe pulmonary HTN)   . CHF (congestive heart failure) ([place])   . CHF exacerbation ([place]) 07/31/2016  . Colon polyps 2002  . Coronary artery disease   . Diabetes mellitus without complication ([place])    Type II  . Dyslipidemia    hx (03/20/2017)  . GERD (gastroesophageal reflux disease)   . History of bleeding peptic ulcer   . History of echocardiogram    Echo 10/16:  EF 25-30%, poss non-compaction, diff HK with inf-lat and apical HK, restrictive physio, mild AI, severe LAE, mild RVE with mild reduced RVSF, PASP 42 mmHg  . Hyperplastic rectal polyp   . Moderate episode of recurrent major depressive disorder ([place]) 08/20/2016   09/14/20 - not current  . OSA on CPAP    Mild  . Pneumonia 09/2018  . Tobacco abuse    Remote    Past Surgical History:  Procedure Laterality Date  . CARDIAC CATHETERIZATION  2007;  2009  . CARDIAC DEFIBRILLATOR PLACEMENT  03/20/2017  . COLONOSCOPY WITH PROPOFOL N/A 01/21/2019   Procedure: COLONOSCOPY WITH PROPOFOL;  Surgeon: Milus Banister, MD;  Location: [facility];  Service: Endoscopy;  Laterality: N/A;  . CORONARY ARTERY BYPASS GRAFT  2007   Had a left main dissection after catheterization. Underwent a saphenous vein graft to the LAD and a saphenous vein graft to obtuse marginal.  . HEMORRHOID SURGERY N/A 08/23/2020   Procedure: HEMORRHOIDECTOMY WITH LIGATION AND HEMORRHOIDOPEXY;  Surgeon: Michael Boston, MD;  Location: [facility];  Service: General;  Laterality: N/A;  . ICD IMPLANT N/A 03/20/2017   Procedure: ICD Implant;  Surgeon: Constance Haw, MD;  Location: [facility];  Service: Cardiovascular;   Laterality: N/A;  . PATELLAR TENDON REPAIR Right 2005  . PATELLAR TENDON REPAIR Right 09/21/2020   Procedure: PATELLA TENDON REPAIR;  Surgeon: Leandrew Koyanagi, MD;  Location: [place];  Service: Orthopedics;  Laterality: Right;  . POLYPECTOMY  01/21/2019   Procedure: POLYPECTOMY;  Surgeon: Milus Banister, MD;  Location: [facility];  Service: Endoscopy;;  . RECTAL EXAM UNDER ANESTHESIA Left 08/23/2020   Procedure: RECTAL EXAM UNDER ANESTHESIA;  Surgeon: Michael Boston, MD;  Location: [facility];  Service: General;  Laterality: Left;     Current Outpatient Medications  Medication Sig Dispense Refill  . ACCU-CHEK GUIDE test strip     . Accu-Chek Softclix Lancets lancets     . blood glucose meter kit and supplies KIT Dispense based on patient and insurance preference. Use up to four times daily as directed. 1 each 0  . carvedilol (COREG) 12.5 MG tablet Take 1 tablet (12.5 mg total) by mouth 2 (two) times daily. 60 tablet 11  . ELIQUIS 5 MG TABS tablet Take 1 tablet by mouth twice daily 180 tablet 0  . JARDIANCE 10 MG TABS tablet Take 10 mg by mouth daily.     . Multiple Vitamin (MULTIVITAMIN WITH MINERALS) TABS tablet Take 1 tablet by mouth daily.    Marland Kitchen omeprazole (PRILOSEC) 40 MG capsule Take 1 capsule by mouth once daily 30 capsule 5  . sacubitril-valsartan (ENTRESTO) 24-26 MG Take 1 tablet by mouth in the morning and at bedtime. 180 tablet 1  . sildenafil (VIAGRA) 50 MG tablet Take 1 tablet (50 mg total) by mouth daily as needed for erectile dysfunction. 10 tablet 5  . simvastatin (ZOCOR) 40 MG tablet Take 40 mg by mouth at bedtime.     Marland Kitchen spironolactone (ALDACTONE) 25 MG tablet Take 25 mg by mouth daily.     Marland Kitchen torsemide (DEMADEX) 20 MG tablet Take 1 tablet (20 mg total) by mouth daily. 60 tablet 10   No current facility-administered medications for this visit.    Allergies:   Patient has no known allergies.     ROS:  Please see the history of present illness.   Otherwise, review of systems  are positive for none.   All other systems are reviewed and negative.    PHYSICAL EXAM: VS:  BP (!) 88/62   Pulse (!) 57   Ht 6' (1.829 m)   Wt 225 lb 6.4 oz (102.2 kg)   SpO2 98%   BMI 30.57 kg/m  , BMI Body mass index is 30.57 kg/m. GENERAL:  Well appearing NECK:  No jugular venous distention, positive HJR, waveform within normal limits, carotid upstroke brisk and symmetric, no bruits, no thyromegaly LUNGS:  Clear to auscultation bilaterally CHEST:  Unremarkable HEART:  PMI not  displaced or sustained,S1 and S2 within normal limits, no S3, no S4, no clicks, no rubs, 3 out of 6 holosystolic murmur heard best at the left third and fourth intercostal space, no diastolic murmurs ABD:  Flat, positive bowel sounds normal in frequency in pitch, no bruits, no rebound, no guarding, no midline pulsatile mass, no hepatomegaly, no splenomegaly EXT:  2 plus pulses throughout, no edema, no cyanosis no clubbing   EKG:  EKG is ordered today. Sinus bradycardia, rate 57, poor anterior R wave progression, mild voltage on the limb leads.  No acute ST-T wave changes.   Recent Labs: 05/23/2020: B Natriuretic Peptide 356.1 12/01/2020: ALT 29; BUN 21; Creatinine, Ser 1.72; Hemoglobin 12.3; Platelets 190.0; Potassium 4.1; Pro B Natriuretic peptide (BNP) 1,097.0; Sodium 140; TSH 2.62    Lipid Panel    Component Value Date/Time   CHOL 142 12/01/2020 1033   TRIG 88.0 12/01/2020 1033   HDL 44.90 12/01/2020 1033   CHOLHDL 3 12/01/2020 1033   VLDL 17.6 12/01/2020 1033   LDLCALC 79 12/01/2020 1033   LDLCALC 92 02/11/2020 1641   LDLDIRECT 166.4 05/17/2010 0905      Wt Readings from Last 3 Encounters:  12/22/20 225 lb 6.4 oz (102.2 kg)  12/01/20 229 lb 1.6 oz (103.9 kg)  11/14/20 227 lb 14.4 oz (103.4 kg)      Other studies Reviewed: Additional studies/ records that were reviewed today include: Device interrogation Review of the above records demonstrates:  Please see elsewhere in the note.      Current medicines are reviewed at length with the patient today.  The patient does not have concerns regarding medicines.  The following changes have been made:  NA  Labs/ tests ordered today include:   Orders Placed This Encounter  Procedures  . Urinalysis  . EKG 12-Lead    ASSESSMENT AND PLAN:    CHRONIC SYSTOLIC AND DIASTOLIC HF:   Today I am going to try to reduce his torsemide to 20 mg daily.  I am going to check a UA.  If he has continued complaints when I see him back I will have him stop the Jardiance.  For now I am going to continue the meds otherwise as listed despite his hypotension he is doing well from a heart failure standpoint otherwise.   GI BLEEDING: His hemoglobin was 12.3 in late January.  No change in therapy.  PAF: He has had no symptomatic recurrence of this and tolerates anticoagulation.  ICD:  He is up to date with his ICD and I reviewed his Jan 2022 interrogation.   OSA:    He is wearing his device.  No change in therapy.  S/p CABG:  He has had no residual symptoms related to this.   HLD: LDL is 79 with an HDL of 4 five.  He can continue the meds as listed.   DM:    A1c was A1c in January was 6.7.  Continue the meds as listed.   CKD stage 3:    Creat was 1.72 which was stable.  This was in late January as well.    Signed, Minus Breeding, MD  12/22/2020 4:21 PM    [facility]

## 2020-12-22 ENCOUNTER — Other Ambulatory Visit: Payer: Self-pay

## 2020-12-22 ENCOUNTER — Encounter: Payer: Self-pay | Admitting: Cardiology

## 2020-12-22 ENCOUNTER — Ambulatory Visit (INDEPENDENT_AMBULATORY_CARE_PROVIDER_SITE_OTHER): Payer: Medicare HMO | Admitting: Cardiology

## 2020-12-22 VITALS — BP 88/62 | HR 57 | Ht 72.0 in | Wt 225.4 lb

## 2020-12-22 DIAGNOSIS — E785 Hyperlipidemia, unspecified: Secondary | ICD-10-CM

## 2020-12-22 DIAGNOSIS — I480 Paroxysmal atrial fibrillation: Secondary | ICD-10-CM | POA: Diagnosis not present

## 2020-12-22 DIAGNOSIS — I5022 Chronic systolic (congestive) heart failure: Secondary | ICD-10-CM | POA: Diagnosis not present

## 2020-12-22 DIAGNOSIS — N1831 Chronic kidney disease, stage 3a: Secondary | ICD-10-CM

## 2020-12-22 MED ORDER — TORSEMIDE 20 MG PO TABS: 20.0000 mg | ORAL_TABLET | Freq: Every day | ORAL | 10 refills | Status: DC

## 2020-12-22 NOTE — Patient Instructions (Signed)
Medication Instructions:  PLEASE DECREASE TORSEMIDE TO 20mg  DAILY  *If you need a refill on your cardiac medications before your next appointment, please call your pharmacy*  Lab Work: [place]  If you have labs (blood work) drawn today and your tests are completely normal, you will receive your results only by: Marland Kitchen MyChart Message (if you have MyChart) OR . A paper copy in the mail If you have any lab test that is abnormal or we need to change your treatment, we will call you to review the results.  Follow-Up: At [facility], you and your health needs are our priority.  As part of our continuing mission to provide you with exceptional heart care, we have created designated Provider Care Teams.  These Care Teams include your primary Cardiologist (physician) and Advanced Practice Providers (APPs -  Physician Assistants and Nurse Practitioners) who all work together to provide you with the care you need, when you need it.  Your next appointment:   10 DAYS   The format for your next appointment:   In Person  Provider:   Minus Breeding, MD

## 2020-12-23 LAB — URINALYSIS
Bilirubin, UA: NEGATIVE
Ketones, UA: NEGATIVE
Leukocytes,UA: NEGATIVE
Nitrite, UA: NEGATIVE
Protein,UA: NEGATIVE
RBC, UA: NEGATIVE
Specific Gravity, UA: 1.014 (ref 1.005–1.030)
Urobilinogen, Ur: 0.2 mg/dL (ref 0.2–1.0)
pH, UA: 7 (ref 5.0–7.5)

## 2020-12-25 ENCOUNTER — Other Ambulatory Visit: Payer: Self-pay | Admitting: Cardiology

## 2020-12-31 DIAGNOSIS — R42 Dizziness and giddiness: Secondary | ICD-10-CM | POA: Insufficient documentation

## 2020-12-31 NOTE — Progress Notes (Signed)
Cardiology Office Note   Date:  01/01/2021   ID:  Ayomide Purdy, DOB [birth year], MRN [id number]  PCP:  Caren Macadam, MD  Cardiologist:   Minus Breeding, MD   Chief Complaint  Patient presents with  . Dizziness      History of Present Illness: Bruce Robertson is a 60 y.o. 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  His course over the years has been difficult because of social issues, occasional inability to obtain meds and inconsistent follow up with appts.   He has been seen at [facility] by the Heart Failure Service.   The HF Clinic at [facility] suggested possible non compaction.  His last EF was 15 -20.  However, he responded well to therapy and seemed to be tolerating med titration with Entresto.   He also has had some GI bleeding but this was related to hemorrhoids.  He has been seen by GI and has been cleared to be on anticoagulation.     At the last visit he was dizzy.  He also had some mild dysuria but a negative UA.  His blood pressure was running low at the last visit so I reduced his diuretic.  Since I reduced his diuretic he has felt much better.  He is not having any dizziness or presyncope.  He actually went to the gym today and routinely goes and he has been able to do his workouts.  Even though his blood pressure is low he feels fine with this.  He has had no new shortness of breath, PND orthopnea.  There have been no palpitations.  Of note I was concerned about some urinary symptoms but he says this really is not bothering him.  Maybe intermittently he might feel like he has a little discharge swelling but there is no pain, dysuria.  Past Medical History:  Diagnosis Date  . AICD (automatic cardioverter/defibrillator) present   . Anxiety 08/20/2016  . Cardiomyopathy     Nonischemic. EF has been about 45%. S/P CABG.;  b.  Echo 4/14: EF 25%, global HK with inf and mid apical AK, restrictive physiology with E/e' > 15 (elevated LV filling pressure), trivial AI/MR, mild to mod LAE, mild RVE, mild reduced RVSF, mild RAE, mod TR, PASP 74 (severe pulmonary HTN)   . CHF (congestive heart failure) ([place])   . CHF exacerbation ([place]) 07/31/2016  . Colon polyps 2002  . Coronary artery disease   . Diabetes mellitus without complication ([place])    Type II  . Dyslipidemia    hx (03/20/2017)  . GERD (gastroesophageal reflux disease)   . History of bleeding peptic ulcer   . History of echocardiogram    Echo 10/16:  EF 25-30%, poss non-compaction, diff HK with inf-lat and apical HK, restrictive physio, mild AI, severe LAE, mild RVE with mild reduced RVSF, PASP 42 mmHg  . Hyperplastic rectal polyp   . Moderate episode of recurrent major depressive disorder ([place]) 08/20/2016   09/14/20 - not current  . OSA on CPAP    Mild  . Pneumonia 09/2018  . Tobacco abuse    Remote    Past Surgical History:  Procedure Laterality Date  . CARDIAC CATHETERIZATION  2007;  2009  . CARDIAC DEFIBRILLATOR PLACEMENT  03/20/2017  . COLONOSCOPY WITH PROPOFOL N/A 01/21/2019  Procedure: COLONOSCOPY WITH PROPOFOL;  Surgeon: Jacobs, Daniel P, MD;  Location: [facility] ENDOSCOPY;  Service: Endoscopy;  Laterality: N/A;  . CORONARY ARTERY BYPASS GRAFT  2007   Had a left main dissection after catheterization. Underwent a saphenous vein graft to the LAD and a saphenous vein graft to obtuse marginal.  . HEMORRHOID SURGERY N/A 08/23/2020   Procedure: HEMORRHOIDECTOMY WITH LIGATION AND HEMORRHOIDOPEXY;  Surgeon: Gross, Steven, MD;  Location: [facility] ORS;  Service: General;  Laterality: N/A;  . ICD IMPLANT N/A 03/20/2017   Procedure: ICD Implant;  Surgeon: Camnitz, Will Martin, MD;  Location: [facility] INVASIVE CV LAB;  Service: Cardiovascular;  Laterality: N/A;  . PATELLAR TENDON REPAIR Right 2005  . PATELLAR TENDON REPAIR Right  09/21/2020   Procedure: PATELLA TENDON REPAIR;  Surgeon: Xu, Naiping M, MD;  Location: [facility] OR;  Service: Orthopedics;  Laterality: Right;  . POLYPECTOMY  01/21/2019   Procedure: POLYPECTOMY;  Surgeon: Jacobs, Daniel P, MD;  Location: [facility] ENDOS[facility];  Service: Endoscopy;;  . RECTAL EXAM UNDER ANESTHESIA Left 08/23/2020   Procedure: RECTAL EXAM UNDER ANESTHESIA;  Surgeon: Gross, Steven, MD;  Location: [facility] ORS;  Service: General;  Laterality: Left;     Current Outpatient Medications  Medication Sig Dispense Refill  . ACCU-CHEK GUIDE test strip     . Accu-Chek Softclix Lancets lancets     . blood glucose meter kit and supplies KIT Dispense based on patient and insurance preference. Use up to four times daily as directed. 1 each 0  . carvedilol (COREG) 12.5 MG tablet Take 1 tablet by mouth twice daily 60 tablet 0  . ELIQUIS 5 MG TABS tablet Take 1 tablet by mouth twice daily 180 tablet 0  . JARDIANCE 10 MG TABS tablet Take 10 mg by mouth daily.     . Multiple Vitamin (MULTIVITAMIN WITH MINERALS) TABS tablet Take 1 tablet by mouth daily.    . omeprazole (PRILOSEC) 40 MG capsule Take 1 capsule by mouth once daily 30 capsule 5  . sacubitril-valsartan (ENTRESTO) 24-26 MG Take 1 tablet by mouth in the morning and at bedtime. 180 tablet 1  . sildenafil (VIAGRA) 50 MG tablet Take 1 tablet (50 mg total) by mouth daily as needed for erectile dysfunction. 10 tablet 5  . simvastatin (ZOCOR) 40 MG tablet Take 40 mg by mouth at bedtime.     . spironolactone (ALDACTONE) 25 MG tablet Take 25 mg by mouth daily.     . torsemide (DEMADEX) 20 MG tablet Take 1 tablet (20 mg total) by mouth daily. 60 tablet 10   No current facility-administered medications for this visit.    Allergies:   Patient has no known allergies.     ROS:  Please see the history of present illness.   Otherwise, review of systems are positive for none.   All other systems are reviewed and negative.    PHYSICAL EXAM: VS:  BP (!) 84/62    Pulse 87   Ht 6' (1.829 m)   Wt 227 lb (103 kg)   SpO2 98%   BMI 30.79 kg/m  , BMI Body mass index is 30.79 kg/m. GENERAL:  Well appearing NECK:  No jugular venous distention, waveform within normal limits, carotid upstroke brisk and symmetric, no bruits, no thyromegaly LUNGS:  Clear to auscultation bilaterally CHEST:  Unremarkable HEART:  PMI not displaced or sustained,S1 and S2 within normal limits, no S3, no S4, no clicks, no rubs, no murmurs ABD:  Flat, positive bowel sounds normal in frequency in pitch,   no bruits, no rebound, no guarding, no midline pulsatile mass, no hepatomegaly, no splenomegaly EXT:  2 plus pulses throughout, no edema, no cyanosis no clubbing    EKG:  EKG is not ordered today.   Recent Labs: 05/23/2020: B Natriuretic Peptide 356.1 12/01/2020: ALT 29; BUN 21; Creatinine, Ser 1.72; Hemoglobin 12.3; Platelets 190.0; Potassium 4.1; Pro B Natriuretic peptide (BNP) 1,097.0; Sodium 140; TSH 2.62    Lipid Panel    Component Value Date/Time   CHOL 142 12/01/2020 1033   TRIG 88.0 12/01/2020 1033   HDL 44.90 12/01/2020 1033   CHOLHDL 3 12/01/2020 1033   VLDL 17.6 12/01/2020 1033   LDLCALC 79 12/01/2020 1033   LDLCALC 92 02/11/2020 1641   LDLDIRECT 166.4 05/17/2010 0905      Wt Readings from Last 3 Encounters:  01/01/21 227 lb (103 kg)  12/22/20 225 lb 6.4 oz (102.2 kg)  12/01/20 229 lb 1.6 oz (103.9 kg)      Other studies Reviewed: Additional studies/ records that were reviewed today include: None Review of the above records demonstrates:  Please see elsewhere in the note.     Current medicines are reviewed at length with the patient today.  The patient does not have concerns regarding medicines.  The following changes have been made:  None  Labs/ tests ordered today include: None  No orders of the defined types were placed in this encounter.   ASSESSMENT AND PLAN:    CHRONIC SYSTOLIC AND DIASTOLIC HF:   Today I think the reduction in diuretic  is all that he needs.  I think he is going to be tolerating the Jardiance.  There is no clear suggestion of any urinary issues but he is also going to see the urologist in a few days to talk about a vasectomy so he can have that exam   .  We talked at length about should he get lightheaded in the future we could cut back further on the torsemide as long as his volume is not an issue.  He is best served by continued salt and fluid restriction.  Eventually he might have to have his Jardiance discontinued if he continues to have any lightheadedness despite backing off on his diuretic.  He would certainly have to have it stop if he has any persistent urinary symptoms.    GI BLEEDING: His hemoglobin was 12.3 in late January.  No change in therapy.   PAF: He has had no symptomatic recurrence of this.   ICD:  He is up to date with his ICD and he had an Jan 2022 interrogation.   OSA:    He is wearing his device.  No change in therapy.   S/p CABG:  He has no residual symptoms no change in therapy.  HLD: LDL was 79.  No change in therapy.  DM:    A1c was A1c 6.7.  No change in therapy.  CKD stage 3:    Creat was 61.72 which was stable.  This was in late January as well.    Signed, James Hochrein, MD  01/01/2021 11:25 AM    [facility] 

## 2021-01-01 ENCOUNTER — Encounter: Payer: Self-pay | Admitting: Cardiology

## 2021-01-01 ENCOUNTER — Other Ambulatory Visit: Payer: Self-pay

## 2021-01-01 ENCOUNTER — Ambulatory Visit (INDEPENDENT_AMBULATORY_CARE_PROVIDER_SITE_OTHER): Payer: Medicare HMO | Admitting: Cardiology

## 2021-01-01 VITALS — BP 84/62 | HR 87 | Ht 72.0 in | Wt 227.0 lb

## 2021-01-01 DIAGNOSIS — I5042 Chronic combined systolic (congestive) and diastolic (congestive) heart failure: Secondary | ICD-10-CM

## 2021-01-01 DIAGNOSIS — N183 Chronic kidney disease, stage 3 unspecified: Secondary | ICD-10-CM | POA: Diagnosis not present

## 2021-01-01 DIAGNOSIS — R42 Dizziness and giddiness: Secondary | ICD-10-CM

## 2021-01-01 NOTE — Patient Instructions (Signed)
Medication Instructions:  No changes *If you need a refill on your cardiac medications before your next appointment, please call your pharmacy*  Follow-Up: At [facility], you and your health needs are our priority.  As part of our continuing mission to provide you with exceptional heart care, we have created designated Provider Care Teams.  These Care Teams include your primary Cardiologist (physician) and Advanced Practice Providers (APPs -  Physician Assistants and Nurse Practitioners) who all work together to provide you with the care you need, when you need it.  Your next appointment:   Friday Mar 09, 2021 at 11:45am  The format for your next appointment:   In Person  Provider:   Doreene Adas, PA-C

## 2021-01-04 NOTE — Progress Notes (Signed)
Noted. Sugar has been well controlled. On Arb. Normal micro.

## 2021-01-21 ENCOUNTER — Emergency Department (HOSPITAL_COMMUNITY): Payer: Medicare HMO

## 2021-01-21 ENCOUNTER — Other Ambulatory Visit: Payer: Self-pay

## 2021-01-21 ENCOUNTER — Emergency Department (HOSPITAL_COMMUNITY)
Admission: EM | Admit: 2021-01-21 | Discharge: 2021-01-21 | Disposition: A | Discharge status: Home / Self Care | Payer: Medicare HMO | Attending: Emergency Medicine | Admitting: Emergency Medicine

## 2021-01-21 DIAGNOSIS — Z79899 Other long term (current) drug therapy: Secondary | ICD-10-CM | POA: Insufficient documentation

## 2021-01-21 DIAGNOSIS — I5023 Acute on chronic systolic (congestive) heart failure: Secondary | ICD-10-CM | POA: Diagnosis not present

## 2021-01-21 DIAGNOSIS — N183 Chronic kidney disease, stage 3 unspecified: Secondary | ICD-10-CM | POA: Insufficient documentation

## 2021-01-21 DIAGNOSIS — I2510 Atherosclerotic heart disease of native coronary artery without angina pectoris: Secondary | ICD-10-CM | POA: Diagnosis not present

## 2021-01-21 DIAGNOSIS — E119 Type 2 diabetes mellitus without complications: Secondary | ICD-10-CM | POA: Insufficient documentation

## 2021-01-21 DIAGNOSIS — R42 Dizziness and giddiness: Secondary | ICD-10-CM | POA: Diagnosis not present

## 2021-01-21 DIAGNOSIS — Y92410 Unspecified street and highway as the place of occurrence of the external cause: Secondary | ICD-10-CM | POA: Insufficient documentation

## 2021-01-21 DIAGNOSIS — I517 Cardiomegaly: Secondary | ICD-10-CM | POA: Diagnosis not present

## 2021-01-21 DIAGNOSIS — Z87891 Personal history of nicotine dependence: Secondary | ICD-10-CM | POA: Diagnosis not present

## 2021-01-21 DIAGNOSIS — M546 Pain in thoracic spine: Secondary | ICD-10-CM | POA: Diagnosis not present

## 2021-01-21 DIAGNOSIS — Z7901 Long term (current) use of anticoagulants: Secondary | ICD-10-CM | POA: Insufficient documentation

## 2021-01-21 DIAGNOSIS — M542 Cervicalgia: Secondary | ICD-10-CM | POA: Diagnosis not present

## 2021-01-21 DIAGNOSIS — I130 Hypertensive heart and chronic kidney disease with heart failure and stage 1 through stage 4 chronic kidney disease, or unspecified chronic kidney disease: Secondary | ICD-10-CM | POA: Insufficient documentation

## 2021-01-21 DIAGNOSIS — R519 Headache, unspecified: Secondary | ICD-10-CM | POA: Insufficient documentation

## 2021-01-21 DIAGNOSIS — R911 Solitary pulmonary nodule: Secondary | ICD-10-CM | POA: Diagnosis not present

## 2021-01-21 DIAGNOSIS — I480 Paroxysmal atrial fibrillation: Secondary | ICD-10-CM | POA: Insufficient documentation

## 2021-01-21 DIAGNOSIS — Z951 Presence of aortocoronary bypass graft: Secondary | ICD-10-CM | POA: Insufficient documentation

## 2021-01-21 MED ORDER — HYDROCODONE-ACETAMINOPHEN 5-325 MG PO TABS: 1.0000 | ORAL_TABLET | ORAL | 0 refills | Status: DC | PRN

## 2021-01-21 MED ORDER — CYCLOBENZAPRINE HCL 10 MG PO TABS: 10.0000 mg | ORAL_TABLET | Freq: Two times a day (BID) | ORAL | 0 refills | Status: DC | PRN
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
## 2021-01-21 NOTE — Discharge Instructions (Addendum)
Your chest x-ray showed an incidental lung nodule that has been noted previously.  It appears stable but the radiologist recommended a repeat CT scan in April 2022 to make sure it is stable.  The radiologist also noted an incidental finding on the head CT.  "Apparent 7 mm CSF density focus within the left aspect of the sella turcica, which may reflect a cyst or potentially a cystic pituitary adenoma. Consider nonemergent contrast-enhanced pituitary protocol brain MRI for further characterization"

## 2021-01-26 ENCOUNTER — Ambulatory Visit (INDEPENDENT_AMBULATORY_CARE_PROVIDER_SITE_OTHER): Payer: Medicare HMO | Admitting: Family Medicine

## 2021-01-26 ENCOUNTER — Other Ambulatory Visit: Payer: Self-pay | Admitting: Family Medicine

## 2021-01-26 ENCOUNTER — Other Ambulatory Visit: Payer: Self-pay | Admitting: Cardiology

## 2021-01-26 ENCOUNTER — Encounter: Payer: Self-pay | Admitting: Family Medicine

## 2021-01-26 ENCOUNTER — Telehealth: Payer: Self-pay | Admitting: Family Medicine

## 2021-01-26 ENCOUNTER — Other Ambulatory Visit: Payer: Self-pay

## 2021-01-26 VITALS — BP 98/60 | HR 69 | Temp 98.0°F | Ht 72.0 in | Wt 220.6 lb

## 2021-01-26 DIAGNOSIS — R519 Headache, unspecified: Secondary | ICD-10-CM | POA: Diagnosis not present

## 2021-01-26 DIAGNOSIS — D352 Benign neoplasm of pituitary gland: Secondary | ICD-10-CM | POA: Diagnosis not present

## 2021-01-26 DIAGNOSIS — R5383 Other fatigue: Secondary | ICD-10-CM

## 2021-01-26 DIAGNOSIS — I952 Hypotension due to drugs: Secondary | ICD-10-CM

## 2021-01-26 DIAGNOSIS — G8929 Other chronic pain: Secondary | ICD-10-CM | POA: Diagnosis not present

## 2021-01-26 DIAGNOSIS — I5023 Acute on chronic systolic (congestive) heart failure: Secondary | ICD-10-CM | POA: Diagnosis not present

## 2021-01-26 MED ORDER — BLOOD PRESSURE CUFF MISC: 1.0000 | Freq: Every day | 0 refills | Status: DC | PRN

## 2021-01-26 NOTE — Telephone Encounter (Signed)
[facility] is calling and state that they received a order for patient to get a MRI but patient has a defibrillator and there equipment doesn't support. MRI needs to be completed at the hospital, please advise. CB [phone]
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
## 2021-01-26 NOTE — Telephone Encounter (Signed)
I changed order to [facility]. Thanks.

## 2021-01-27 LAB — SEDIMENTATION RATE: Sed Rate: 14 mm/h (ref 0–20)

## 2021-01-27 LAB — BRAIN NATRIURETIC PEPTIDE: Brain Natriuretic Peptide: 663 pg/mL — ABNORMAL HIGH (ref <100)

## 2021-01-29 ENCOUNTER — Telehealth (HOSPITAL_COMMUNITY): Payer: Self-pay

## 2021-01-29 LAB — TIQ-MISC

## 2021-01-29 LAB — HOUSE ACCOUNT TRACKING

## 2021-01-29 LAB — BRAIN NATRIURETIC PEPTIDE

## 2021-01-29 NOTE — Telephone Encounter (Signed)
Spoke with [place] at [facility] and informed her of the message below.

## 2021-02-09 ENCOUNTER — Ambulatory Visit (HOSPITAL_COMMUNITY): Admission: RE | Admit: 2021-02-09 | Payer: Medicare HMO | Source: Ambulatory Visit

## 2021-02-12 ENCOUNTER — Other Ambulatory Visit: Payer: Self-pay | Admitting: Family Medicine

## 2021-02-12 ENCOUNTER — Telehealth: Payer: Self-pay | Admitting: Family Medicine

## 2021-02-12 ENCOUNTER — Other Ambulatory Visit: Payer: Self-pay | Admitting: Cardiology

## 2021-02-12 MED ORDER — HYDROCODONE-ACETAMINOPHEN 5-325 MG PO TABS: 1.0000 | ORAL_TABLET | Freq: Three times a day (TID) | ORAL | 0 refills | Status: DC | PRN

## 2021-02-12 MED ORDER — CYCLOBENZAPRINE HCL 10 MG PO TABS: 10.0000 mg | ORAL_TABLET | Freq: Two times a day (BID) | ORAL | 0 refills | Status: DC | PRN

## 2021-02-12 NOTE — Telephone Encounter (Signed)
Patient wanted to make you aware that the MRI is scheduled for 03/01/2021.  Also would like a refill for HYDROcodone-acetaminophen (NORCO/VICODIN) 5-325 MG tablet and for cyclobenzaprine (FLEXERIL) 10 MG tablet.  Send to: [facility] ([address] [place]  [address], [place]) [place] [address]  Phone:  [phone] Fax:  [phone]

## 2021-02-12 NOTE — Telephone Encounter (Signed)
Patient informed of the message below and stated the headaches are the same.

## 2021-02-12 NOTE — Telephone Encounter (Signed)
Refills sent; let me know if headaches have worsened at all.

## 2021-02-15 ENCOUNTER — Ambulatory Visit (INDEPENDENT_AMBULATORY_CARE_PROVIDER_SITE_OTHER): Payer: Medicare HMO

## 2021-02-15 DIAGNOSIS — I5042 Chronic combined systolic (congestive) and diastolic (congestive) heart failure: Secondary | ICD-10-CM | POA: Diagnosis not present

## 2021-02-15 LAB — CUP PACEART REMOTE DEVICE CHECK
Battery Remaining Longevity: 94 mo
Battery Voltage: 3 V
Brady Statistic RV Percent Paced: 0.87 %
Date Time Interrogation Session: 20220414033425
HighPow Impedance: 58 Ohm
Implantable Lead Implant Date: 20180517
Implantable Lead Location: 753860
Implantable Pulse Generator Implant Date: 20180517
Lead Channel Impedance Value: 304 Ohm
Lead Channel Impedance Value: 399 Ohm
Lead Channel Pacing Threshold Amplitude: 0.75 V
Lead Channel Pacing Threshold Pulse Width: 0.4 ms
Lead Channel Sensing Intrinsic Amplitude: 10.375 mV
Lead Channel Sensing Intrinsic Amplitude: 10.375 mV
Lead Channel Setting Pacing Amplitude: 2.5 V
Lead Channel Setting Pacing Pulse Width: 0.4 ms
Lead Channel Setting Sensing Sensitivity: 0.3 mV

## 2021-03-01 ENCOUNTER — Other Ambulatory Visit: Payer: Self-pay

## 2021-03-01 ENCOUNTER — Ambulatory Visit (HOSPITAL_COMMUNITY)
Admission: RE | Admit: 2021-03-01 | Discharge: 2021-03-01 | Disposition: A | Discharge status: Home / Self Care | Payer: Medicare HMO | Source: Ambulatory Visit | Attending: Family Medicine | Admitting: Family Medicine

## 2021-03-01 DIAGNOSIS — E236 Other disorders of pituitary gland: Secondary | ICD-10-CM | POA: Diagnosis not present

## 2021-03-01 DIAGNOSIS — D352 Benign neoplasm of pituitary gland: Secondary | ICD-10-CM | POA: Diagnosis not present

## 2021-03-01 DIAGNOSIS — Q046 Congenital cerebral cysts: Secondary | ICD-10-CM | POA: Diagnosis not present

## 2021-03-01 MED ORDER — GADOBUTROL 1 MMOL/ML IV SOLN
9.0000 mL | Freq: Once | INTRAVENOUS | Status: AC | PRN
  Administered 2021-03-01: 9 mL via INTRAVENOUS

## 2021-03-01 NOTE — Progress Notes (Signed)
[facility] here today at [facility] for [facility] brain w/wo contrast. Patient has [facility]. [facility] sent to Mike[facility] and [facility]. Orders received for OVO

## 2021-03-05 ENCOUNTER — Other Ambulatory Visit (INDEPENDENT_AMBULATORY_CARE_PROVIDER_SITE_OTHER): Payer: Medicare HMO

## 2021-03-05 ENCOUNTER — Other Ambulatory Visit: Payer: Self-pay | Admitting: *Deleted

## 2021-03-05 ENCOUNTER — Other Ambulatory Visit: Payer: Self-pay

## 2021-03-05 ENCOUNTER — Other Ambulatory Visit: Payer: Self-pay | Admitting: Family Medicine

## 2021-03-05 DIAGNOSIS — D352 Benign neoplasm of pituitary gland: Secondary | ICD-10-CM

## 2021-03-05 DIAGNOSIS — E236 Other disorders of pituitary gland: Secondary | ICD-10-CM

## 2021-03-05 LAB — T4, FREE: Free T4: 0.88 ng/dL (ref 0.60–1.60)

## 2021-03-05 LAB — BASIC METABOLIC PANEL
BUN: 16 mg/dL (ref 6–23)
CO2: 32 mEq/L (ref 19–32)
Calcium: 9.8 mg/dL (ref 8.4–10.5)
Chloride: 99 mEq/L (ref 96–112)
Creatinine, Ser: 1.59 mg/dL — ABNORMAL HIGH (ref 0.40–1.50)
GFR: 47.67 mL/min — ABNORMAL LOW (ref >60.00)
Glucose, Bld: 113 mg/dL — ABNORMAL HIGH (ref 70–99)
Potassium: 3.8 mEq/L (ref 3.5–5.1)
Sodium: 141 mEq/L (ref 135–145)

## 2021-03-05 LAB — TESTOSTERONE: Testosterone: 372.84 ng/dL (ref 300.00–890.00)

## 2021-03-05 LAB — HEMOGLOBIN A1C: Hgb A1c MFr Bld: 7.1 % — ABNORMAL HIGH (ref 4.6–6.5)

## 2021-03-05 LAB — FOLLICLE STIMULATING HORMONE: FSH: 4 m[IU]/mL (ref 1.4–18.1)

## 2021-03-05 LAB — TSH: TSH: 1.61 u[IU]/mL (ref 0.35–4.50)

## 2021-03-05 LAB — T3, FREE: T3, Free: 3.4 pg/mL (ref 2.3–4.2)

## 2021-03-05 LAB — LUTEINIZING HORMONE: LH: 2.59 m[IU]/mL (ref 1.50–9.30)

## 2021-03-05 LAB — CORTISOL: Cortisol, Plasma: 16.3 ug/dL

## 2021-03-05 NOTE — Progress Notes (Signed)
co

## 2021-03-05 NOTE — Progress Notes (Signed)
Remote ICD transmission.   
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
## 2021-03-09 ENCOUNTER — Ambulatory Visit: Payer: Medicare HMO | Admitting: Physician Assistant

## 2021-03-09 LAB — PROLACTIN: Prolactin: 8.4 ng/mL (ref 2.0–18.0)

## 2021-03-09 LAB — ACTH: C206 ACTH: 61 pg/mL — ABNORMAL HIGH (ref 6–50)

## 2021-03-14 ENCOUNTER — Other Ambulatory Visit: Payer: Self-pay

## 2021-03-14 ENCOUNTER — Telehealth: Payer: Self-pay | Admitting: Family Medicine

## 2021-03-14 ENCOUNTER — Other Ambulatory Visit (INDEPENDENT_AMBULATORY_CARE_PROVIDER_SITE_OTHER): Payer: Medicare HMO

## 2021-03-14 DIAGNOSIS — E236 Other disorders of pituitary gland: Secondary | ICD-10-CM

## 2021-03-14 NOTE — Telephone Encounter (Signed)
Pt needs a refill on Hydrocodone and cyclobenzaprine  Pharmacy- [facility] on [address]

## 2021-03-16 ENCOUNTER — Other Ambulatory Visit: Payer: Self-pay | Admitting: Family Medicine

## 2021-03-16 MED ORDER — HYDROCODONE-ACETAMINOPHEN 5-325 MG PO TABS: 1.0000 | ORAL_TABLET | Freq: Three times a day (TID) | ORAL | 0 refills | Status: DC | PRN

## 2021-03-16 MED ORDER — CYCLOBENZAPRINE HCL 10 MG PO TABS: 10.0000 mg | ORAL_TABLET | Freq: Two times a day (BID) | ORAL | 0 refills | Status: DC | PRN

## 2021-03-21 LAB — THYROTROPIN RECEPTOR AUTOABS: Thyrotropin Receptor Ab: 1.22 IU/L (ref 0.00–1.75)

## 2021-03-21 LAB — IGF-BP1: IGF Binding Protein 1: 13 ng/mL

## 2021-03-24 LAB — IGF-BP1: IGF Binding Protein 1: 12 ng/mL

## 2021-03-24 LAB — THYROTROPIN RECEPTOR AUTOABS: Thyrotropin Receptor Ab: 1.17 IU/L (ref 0.00–1.75)

## 2021-03-26 ENCOUNTER — Other Ambulatory Visit: Payer: Self-pay | Admitting: Family Medicine

## 2021-03-26 ENCOUNTER — Encounter: Payer: Self-pay | Admitting: Neurology

## 2021-03-26 DIAGNOSIS — D352 Benign neoplasm of pituitary gland: Secondary | ICD-10-CM

## 2021-03-26 DIAGNOSIS — R519 Headache, unspecified: Secondary | ICD-10-CM

## 2021-03-26 DIAGNOSIS — E270 Other adrenocortical overactivity: Secondary | ICD-10-CM

## 2021-04-05 DIAGNOSIS — I5022 Chronic systolic (congestive) heart failure: Secondary | ICD-10-CM | POA: Diagnosis not present

## 2021-04-24 ENCOUNTER — Other Ambulatory Visit: Payer: Self-pay | Admitting: Family Medicine

## 2021-04-24 NOTE — Telephone Encounter (Signed)
Pt is calling in needing a refill on the following Rx's hydrocodone-acetaminophen (NORCO/VICODIN) 5-325 MG and cyclobenzaprine (FLEXERIL) 10 MG   Pharm:  [facility] on [address] in [place], [place]

## 2021-04-25 ENCOUNTER — Other Ambulatory Visit: Payer: Self-pay | Admitting: Family Medicine

## 2021-04-25 MED ORDER — HYDROCODONE-ACETAMINOPHEN 5-325 MG PO TABS: 1.0000 | ORAL_TABLET | Freq: Three times a day (TID) | ORAL | 0 refills | Status: DC | PRN

## 2021-04-25 MED ORDER — CYCLOBENZAPRINE HCL 10 MG PO TABS: 10.0000 mg | ORAL_TABLET | Freq: Two times a day (BID) | ORAL | 0 refills | Status: DC | PRN

## 2021-04-25 NOTE — Telephone Encounter (Signed)
I spoke with the pt and he stated he is using Hydrocodone for his neck pain and headaches. Pt scheduled OV for 07/22.

## 2021-04-25 NOTE — Telephone Encounter (Signed)
Is he using this for neck pain? Needs to have follow up visit. I will send in one refill; but has to have visit before further refills.

## 2021-05-17 ENCOUNTER — Encounter: Payer: Self-pay | Admitting: Emergency Medicine

## 2021-05-17 ENCOUNTER — Ambulatory Visit (INDEPENDENT_AMBULATORY_CARE_PROVIDER_SITE_OTHER): Payer: Medicare HMO

## 2021-05-17 ENCOUNTER — Ambulatory Visit
Admission: EM | Admit: 2021-05-17 | Discharge: 2021-05-17 | Disposition: A | Discharge status: Home / Self Care | Payer: Medicare HMO | Attending: Nurse Practitioner | Admitting: Nurse Practitioner

## 2021-05-17 DIAGNOSIS — I428 Other cardiomyopathies: Secondary | ICD-10-CM

## 2021-05-17 DIAGNOSIS — I5042 Chronic combined systolic (congestive) and diastolic (congestive) heart failure: Secondary | ICD-10-CM

## 2021-05-17 DIAGNOSIS — R110 Nausea: Secondary | ICD-10-CM

## 2021-05-17 DIAGNOSIS — R1032 Left lower quadrant pain: Secondary | ICD-10-CM

## 2021-05-17 DIAGNOSIS — R059 Cough, unspecified: Secondary | ICD-10-CM

## 2021-05-17 LAB — CUP PACEART REMOTE DEVICE CHECK
Battery Remaining Longevity: 86 mo
Battery Voltage: 2.99 V
Brady Statistic RV Percent Paced: 0.29 %
Date Time Interrogation Session: 20220714012503
HighPow Impedance: 69 Ohm
Implantable Lead Implant Date: 20180517
Implantable Lead Location: 753860
Implantable Pulse Generator Implant Date: 20180517
Lead Channel Impedance Value: 361 Ohm
Lead Channel Impedance Value: 418 Ohm
Lead Channel Pacing Threshold Amplitude: 0.625 V
Lead Channel Pacing Threshold Pulse Width: 0.4 ms
Lead Channel Sensing Intrinsic Amplitude: 11 mV
Lead Channel Sensing Intrinsic Amplitude: 11 mV
Lead Channel Setting Pacing Amplitude: 2.5 V
Lead Channel Setting Pacing Pulse Width: 0.4 ms
Lead Channel Setting Sensing Sensitivity: 0.3 mV

## 2021-05-17 LAB — POCT FASTING CBG KUC MANUAL ENTRY: POCT Glucose (KUC): 138 mg/dL — AB (ref 70–99)

## 2021-05-17 MED ORDER — ONDANSETRON 4 MG PO TBDP
4.0000 mg | ORAL_TABLET | Freq: Once | ORAL | Status: AC
  Administered 2021-05-17: 4 mg via ORAL

## 2021-05-17 MED ORDER — DICYCLOMINE HCL 20 MG PO TABS: 20.0000 mg | ORAL_TABLET | Freq: Four times a day (QID) | ORAL | 0 refills | Status: DC | PRN

## 2021-05-17 MED ORDER — METRONIDAZOLE 500 MG PO TABS: 500.0000 mg | ORAL_TABLET | Freq: Three times a day (TID) | ORAL | 0 refills | Status: AC

## 2021-05-17 MED ORDER — CIPROFLOXACIN HCL 500 MG PO TABS: 500.0000 mg | ORAL_TABLET | Freq: Two times a day (BID) | ORAL | 0 refills | Status: AC

## 2021-05-17 MED ORDER — PROMETHAZINE-DM 6.25-15 MG/5ML PO SYRP: 5.0000 mL | ORAL_SOLUTION | Freq: Four times a day (QID) | ORAL | 0 refills | Status: DC | PRN

## 2021-05-17 MED ORDER — ONDANSETRON 8 MG PO TBDP: 8.0000 mg | ORAL_TABLET | Freq: Three times a day (TID) | ORAL | 0 refills | Status: DC | PRN

## 2021-05-17 NOTE — ED Triage Notes (Addendum)
Acute white sputum production on chronic cough x 3 weeks, with SOB and N/V/D. Chills present on assessment. Denies chest pain and fever. Hx of congestive heart failure, and diabetes. Not currently on meds for diabetes.

## 2021-05-17 NOTE — Discharge Instructions (Addendum)
Take medications as prescribed  Drink plenty of fluids  BLAND diet Monitor symptoms closely  Follow-up with GI if no improvement in symptoms  Go to ED immediately if symptoms get worse

## 2021-05-17 NOTE — ED Provider Notes (Signed)
[facility]    CSN: [id number] Arrival date & time: 05/17/21  1009      History   Chief Complaint Chief Complaint  Patient presents with   Nausea   Cough    HPI Bruce Robertson is a 60 y.o. male.   Subjective:   Bruce Robertson is a 60 y.o. male here for evaluation of a cough.  The cough is without wheezing, dyspnea or hemoptysis. The cough is productive with thick, white sputum. His chest is painful during coughing and is aggravated by nothing. Onset of symptoms was 3 weeks ago and has unchanged since that time.  He denies any fever, congestion, rhinorrhea, sore throat, headache or dizziness. He has tried mucinex and cough drops without any relief in symptoms. Home COVID test negative.  Patient does not have a history of asthma. Patient has not had recent travel. Patient does have a history of smoking. Patient  has not had a previous chest x-ray.   Bruce Robertson also complains of abdominal pain. The pain is described as aching and cramping, and is 8/10 in intensity. Pain is located in the LUQ without radiation. Onset was 3 weeks ago. Symptoms have been waxing and waning since onset. Aggravating factors: none.  Alleviating factors: none. Associated symptoms: anorexia, nausea, and dry heaves.  He also reports a 12 pound weight loss since symptoms started.  The patient denies constipation, diarrhea, dysuria, flatus, frequency, hematochezia, melena, myalgias, vomiting or alcohol consumption.  He has tried Pepto-Bismol without relief in his symptoms.  The following portions of the patient's history were reviewed and updated as appropriate: allergies, current medications, past family history, past medical history, past social history, past surgical history, and problem list.     Past Medical History:  Diagnosis Date   AICD (automatic cardioverter/defibrillator) present    Anxiety 08/20/2016   Cardiomyopathy    Nonischemic. EF has been about 45%. S/P CABG.;  b.  Echo 4/14:  EF 25%, global HK with inf and mid apical AK, restrictive physiology with E/e' > 15 (elevated LV filling pressure), trivial AI/MR, mild to mod LAE, mild RVE, mild reduced RVSF, mild RAE, mod TR, PASP 74 (severe pulmonary HTN)    CHF (congestive heart failure) (HCC)    CHF exacerbation (HCC) 07/31/2016   Colon polyps 2002   Coronary artery disease    Diabetes mellitus without complication (HCC)    Type II   Dyslipidemia    hx (03/20/2017)   GERD (gastroesophageal reflux disease)    History of bleeding peptic ulcer    History of echocardiogram    Echo 10/16:  EF 25-30%, poss non-compaction, diff HK with inf-lat and apical HK, restrictive physio, mild AI, severe LAE, mild RVE with mild reduced RVSF, PASP 42 mmHg   Hyperplastic rectal polyp    Moderate episode of recurrent major depressive disorder ([place]) 08/20/2016   09/14/20 - not current   OSA on CPAP    Mild   Pneumonia 09/2018   Tobacco abuse    Remote    Patient Active Problem List   Diagnosis Date Noted   Dizziness 12/31/2020   Patellar tendon rupture, right, initial encounter 09/13/2020   Restrictive lung disease 06/22/2020   BRBPR (bright red blood per rectum) 32/44/0102   Chronic systolic CHF (congestive heart failure) ([place]) 05/24/2020   Acute blood loss anemia 05/24/2020   Paroxysmal atrial fibrillation ([place]) 03/28/2020   Secondary hypercoagulable state ([place]) 03/28/2020   Educated about COVID-19 virus infection 02/10/2020   S/P ICD (internal  cardiac defibrillator) procedure 07/18/2019   Nontraumatic complete tear of right rotator cuff 06/29/2019   Pain in left elbow 06/29/2019   Prediabetes 06/10/2018   Thrombocytopenia ([place]) 06/10/2018   AICD (automatic cardioverter/defibrillator) present 06/10/2018   History of peptic ulcer disease 06/10/2018   NSVT (nonsustained ventricular tachycardia) (HCC)    Hypotension due to drugs 02/09/2018   Difficulty controlling anger 12/31/2016   Anxiety 08/20/2016   Moderate episode of  recurrent major depressive disorder ([place]) 08/20/2016   Polyp of colon 04/29/2016   Shortness of breath 03/05/2016   Essential hypertension 10/31/2015   Acute on chronic systolic heart failure ([place]) 08/29/2015   Hx of CABG 08/29/2015   Stage 3 chronic kidney disease ([place]) 08/29/2015   Sleep apnea 06/10/2011   Fatigue 06/10/2011   Obesity (BMI 30-39.9) 05/02/2010   Hemorrhoids 02/08/2010   Esophageal reflux 02/08/2010   Rectal bleeding 02/08/2010   Hyperlipidemia 04/06/2009   TOBACCO USER 04/06/2009   Cardiomyopathy, dilated, nonischemic ([place]) 04/06/2009   Non-ischemic cardiomyopathy ([place]) 04/06/2009    Past Surgical History:  Procedure Laterality Date   CARDIAC CATHETERIZATION  2007;  2009   CARDIAC DEFIBRILLATOR PLACEMENT  03/20/2017   COLONOSCOPY WITH PROPOFOL N/A 01/21/2019   Procedure: COLONOSCOPY WITH PROPOFOL;  Surgeon: Milus Banister, MD;  Location: [facility];  Service: Endoscopy;  Laterality: N/A;   CORONARY ARTERY BYPASS GRAFT  2007   Had a left main dissection after catheterization. Underwent a saphenous vein graft to the LAD and a saphenous vein graft to obtuse marginal.   HEMORRHOID SURGERY N/A 08/23/2020   Procedure: HEMORRHOIDECTOMY WITH LIGATION AND HEMORRHOIDOPEXY;  Surgeon: Michael Boston, MD;  Location: [facility];  Service: General;  Laterality: N/A;   ICD IMPLANT N/A 03/20/2017   Procedure: ICD Implant;  Surgeon: Constance Haw, MD;  Location: [facility];  Service: Cardiovascular;  Laterality: N/A;   PATELLAR TENDON REPAIR Right 2005   PATELLAR TENDON REPAIR Right 09/21/2020   Procedure: PATELLA TENDON REPAIR;  Surgeon: Leandrew Koyanagi, MD;  Location: [facility];  Service: Orthopedics;  Laterality: Right;   POLYPECTOMY  01/21/2019   Procedure: POLYPECTOMY;  Surgeon: Milus Banister, MD;  Location: [facility];  Service: Endoscopy;;   RECTAL EXAM UNDER ANESTHESIA Left 08/23/2020   Procedure: RECTAL EXAM UNDER ANESTHESIA;  Surgeon: Michael Boston, MD;   Location: [facility];  Service: General;  Laterality: Left;       Home Medications    Prior to Admission medications   Medication Sig Start Date End Date Taking? Authorizing Provider  carvedilol (COREG) 12.5 MG tablet TAKE 1 TABLET BY MOUTH TWICE DAILY . APPOINTMENT REQUIRED FOR FUTURE REFILLS 02/12/21  Yes Camnitz, Ocie Doyne, MD  ciprofloxacin (CIPRO) 500 MG tablet Take 1 tablet (500 mg total) by mouth every 12 (twelve) hours for 7 days. 05/17/21 05/24/21 Yes Enrique Sack, FNP  dicyclomine (BENTYL) 20 MG tablet Take 1 tablet (20 mg total) by mouth 4 (four) times daily as needed (abdominal cramping). 05/17/21  Yes Enrique Sack, FNP  ELIQUIS 5 MG TABS tablet Take 1 tablet by mouth twice daily 12/05/20  Yes Duke, Tami Lin, PA  JARDIANCE 10 MG TABS tablet Take 10 mg by mouth daily.  06/29/20  Yes [provider]  metroNIDAZOLE (FLAGYL) 500 MG tablet Take 1 tablet (500 mg total) by mouth 3 (three) times daily for 7 days. 05/17/21 05/24/21 Yes Enrique Sack, FNP  Multiple Vitamin (MULTIVITAMIN WITH MINERALS) TABS tablet Take 1 tablet by mouth daily.   Yes [provider]  omeprazole (PRILOSEC) 40 MG capsule Take 1 capsule by mouth once daily 12/06/20  Yes Spero Geralds, MD  ondansetron (ZOFRAN ODT) 8 MG disintegrating tablet Take 1 tablet (8 mg total) by mouth every 8 (eight) hours as needed for nausea or vomiting. 05/17/21  Yes Enrique Sack, FNP  promethazine-dextromethorphan (PROMETHAZINE-DM) 6.25-15 MG/5ML syrup Take 5 mLs by mouth 4 (four) times daily as needed for cough. 05/17/21  Yes Izora Benn, Aldona Bar, FNP  sacubitril-valsartan (ENTRESTO) 24-26 MG Take 1 tablet by mouth in the morning and at bedtime. 11/14/20  Yes Koberlein, Junell C, MD  simvastatin (ZOCOR) 40 MG tablet Take 40 mg by mouth at bedtime.  05/08/20  Yes [provider]  spironolactone (ALDACTONE) 25 MG tablet Take 25 mg by mouth daily.  03/30/20  Yes [provider]  torsemide (DEMADEX)  20 MG tablet Take 1 tablet (20 mg total) by mouth daily. 12/22/20  Yes Minus Breeding, MD  ACCU-CHEK GUIDE test strip  02/15/20   [provider]  Accu-Chek Softclix Lancets lancets  02/15/20   [provider]  blood glucose meter kit and supplies KIT Dispense based on patient and insurance preference. Use up to four times daily as directed. 02/14/20   Caren Macadam, MD  Blood Pressure Monitoring (BLOOD PRESSURE CUFF) MISC 1 Device by Does not apply route daily as needed. 01/26/21   Caren Macadam, MD  cyclobenzaprine (FLEXERIL) 10 MG tablet Take 1 tablet (10 mg total) by mouth 2 (two) times daily as needed for muscle spasms. 04/25/21   Caren Macadam, MD  HYDROcodone-acetaminophen (NORCO/VICODIN) 5-325 MG tablet Take 1 tablet by mouth 3 (three) times daily as needed. 04/25/21   Caren Macadam, MD  sildenafil (VIAGRA) 50 MG tablet Take 1 tablet (50 mg total) by mouth daily as needed for erectile dysfunction. 11/14/20   Caren Macadam, MD    Family History Family History  Problem Relation Age of Onset   Hypertension Mother    Osteoarthritis Mother    Heart failure Father 60   High blood pressure Father    Lung cancer Other    Cancer Maternal Grandmother        uncertain type   Bipolar disorder Daughter    Diabetes Daughter    Cancer Maternal Uncle        uncertain type   Diabetes Niece    Asthma Neg Hx     Social History Social History   Tobacco Use   Smoking status: Former    Packs/day: 0.25    Years: 7.00    Pack years: 1.75    Types: Cigarettes   Smokeless tobacco: Never   Tobacco comments:    03/20/2017 "quit in [place] Use   Vaping Use: Former   Devices: vaped for about a month  Substance Use Topics   Alcohol use: Not Currently    Alcohol/week: 0.0 standard drinks    Comment: twice a month no preference   Drug use: Yes    Frequency: 1.0 times per week    Types: Marijuana    Comment: last time 09/15/20     Allergies    Patient has no known allergies.   Review of Systems Review of Systems  Constitutional:  Positive for appetite change. Negative for fever.  HENT:  Negative for congestion.   Respiratory:  Positive for cough. Negative for shortness of breath and wheezing.   Cardiovascular:  Negative for chest pain.  Gastrointestinal:  Positive for abdominal  pain and nausea. Negative for blood in stool, constipation, diarrhea and vomiting.  Genitourinary:  Negative for dysuria.  Musculoskeletal:  Negative for myalgias.  Skin:  Negative for rash.  Neurological:  Negative for dizziness and headaches.  All other systems reviewed and are negative.   Physical Exam Triage Vital Signs ED Triage Vitals  Enc Vitals Group     BP 05/17/21 1034 105/67     Pulse Rate 05/17/21 1034 64     Resp 05/17/21 1034 16     Temp 05/17/21 1034 97.8 F (36.6 C)     Temp Source 05/17/21 1034 Oral     SpO2 05/17/21 1034 99 %     Weight --      Height --      Head Circumference --      Peak Flow --      Pain Score 05/17/21 1035 8     Pain Loc --      Pain Edu? --      Excl. in [place]? --    No data found.  Updated Vital Signs BP 105/67 (BP Location: Left Arm)   Pulse 64   Temp 97.8 F (36.6 C) (Oral)   Resp 16   SpO2 99%   Visual Acuity Right Eye Distance:   Left Eye Distance:   Bilateral Distance:    Right Eye Near:   Left Eye Near:    Bilateral Near:     Physical Exam Vitals reviewed.  Constitutional:      General: He is not in acute distress.    Appearance: Normal appearance. He is not ill-appearing, toxic-appearing or diaphoretic.  HENT:     Head: Normocephalic.  Cardiovascular:     Rate and Rhythm: Normal rate and regular rhythm.  Pulmonary:     Effort: Pulmonary effort is normal.     Breath sounds: Normal breath sounds.  Abdominal:     General: Bowel sounds are normal. There is no distension.     Palpations: Abdomen is soft.     Tenderness: There is abdominal tenderness. There is no right  CVA tenderness, left CVA tenderness, guarding or rebound.  Musculoskeletal:        General: Normal range of motion.     Cervical back: Normal range of motion and neck supple.  Skin:    General: Skin is warm and dry.  Neurological:     General: No focal deficit present.     Mental Status: He is alert and oriented to person, place, and time.  Psychiatric:        Mood and Affect: Mood normal.        Behavior: Behavior normal.     UC Treatments / Results  Labs (all labs ordered are listed, but only abnormal results are displayed) Labs Reviewed  POCT FASTING CBG [facility] MANUAL ENTRY - Abnormal; Notable for the following components:      Result Value   POCT Glucose ([facility]) 138 (*)    All other components within normal limits  COMPREHENSIVE METABOLIC PANEL  LIPASE    EKG   Radiology CUP PACEART REMOTE DEVICE CHECK  Result Date: 05/17/2021 Scheduled remote reviewed. Normal device function.  There were three NSVT arrhythmias detected, two episodes were greater than 10 beats, sent to triage Known PAF, on [place] according to previous reports, AF burden is 0.2% of the time Next remote 91 days. Kathy Breach, RN, CCDS, [employer]   Procedures Procedures (including critical care time)  Medications Ordered in  UC Medications  ondansetron (ZOFRAN-ODT) disintegrating tablet 4 mg (4 mg Oral Given 05/17/21 1131)    Initial Impression / Assessment and Plan / UC Course  I have reviewed the triage vital signs and the nursing notes.  Pertinent labs & imaging results that were available during my care of the patient were reviewed by me and considered in my medical decision making (see chart for details).     60 year old male with multiple medical problems presenting with a 3-week history of cough, abdominal pain, nausea, anorexia and dry heaves.  No improvement with conservative measures.  He has decreased appetite but is able to tolerate soup and some liquids without difficulty.  Patient is  afebrile.  Nontoxic.  Vital signs stable.  Diffused left upper quadrant tenderness without rebound or distention.  Plan: Clear liquids Adhere to simple, bland diet. Labs pending (CMP, lipase)  Prescriptions: Cipro/Flagyl/Bentyl/Zofran/Promethazine DM Further follow-up plans will be based on outcome of lab studies  Monitor symptoms closely Follow-up with GI if no improvement of symptoms Go to the ED immediately if worse  Today's evaluation has revealed no signs of a dangerous process. Discussed diagnosis with patient and/or guardian. Patient and/or guardian aware of their diagnosis, possible red flag symptoms to watch out for and need for close follow up. Patient and/or guardian understands verbal and written discharge instructions. Patient and/or guardian comfortable with plan and disposition.  Patient and/or guardian has a clear mental status at this time, good insight into illness (after discussion and teaching) and has clear judgment to make decisions regarding their care  This care was provided during an unprecedented National Emergency due to the Novel Coronavirus (COVID-19) pandemic. COVID-19 infections and transmission risks place heavy strains on healthcare resources.  As this pandemic evolves, our facility, providers, and staff strive to respond fluidly, to remain operational, and to provide care relative to available resources and information. Outcomes are unpredictable and treatments are without well-defined guidelines. Further, the impact of COVID-19 on all aspects of urgent care, including the impact to patients seeking care for reasons other than COVID-19, is unavoidable during this national emergency. At this time of the global pandemic, management of patients has significantly changed, even for non-COVID positive patients given high local and regional COVID volumes at this time requiring high healthcare system and resource utilization. The standard of care for management of both COVID  suspected and non-COVID suspected patients continues to change rapidly at the local, regional, national, and global levels. This patient was worked up and treated to the best available but ever changing evidence and resources available at this current time.   Documentation was completed with the aid of voice recognition software. Transcription may contain typographical errors.     Final Clinical Impressions(s) / UC Diagnoses   Final diagnoses:  Left lower quadrant abdominal pain  Nausea without vomiting  Cough     Discharge Instructions      Take medications as prescribed  Drink plenty of fluids  BLAND diet Monitor symptoms closely  Follow-up with GI if no improvement in symptoms  Go to ED immediately if symptoms get worse      ED Prescriptions     Medication Sig Dispense Auth. Provider   ciprofloxacin (CIPRO) 500 MG tablet Take 1 tablet (500 mg total) by mouth every 12 (twelve) hours for 7 days. 14 tablet Jovaun Levene, [place], FNP   metroNIDAZOLE (FLAGYL) 500 MG tablet Take 1 tablet (500 mg total) by mouth 3 (three) times daily for 7 days. 21 tablet Hever Castilleja,  Aldona Bar, FNP   ondansetron (ZOFRAN ODT) 8 MG disintegrating tablet Take 1 tablet (8 mg total) by mouth every 8 (eight) hours as needed for nausea or vomiting. 20 tablet Enrique Sack, FNP   dicyclomine (BENTYL) 20 MG tablet Take 1 tablet (20 mg total) by mouth 4 (four) times daily as needed (abdominal cramping). 20 tablet Enrique Sack, FNP   promethazine-dextromethorphan (PROMETHAZINE-DM) 6.25-15 MG/5ML syrup Take 5 mLs by mouth 4 (four) times daily as needed for cough. 118 mL Enrique Sack, FNP      PDMP not reviewed this encounter.   Enrique Sack, [place] 05/17/21 1221

## 2021-05-19 LAB — COMPREHENSIVE METABOLIC PANEL
ALT: 23 IU/L (ref 0–44)
AST: 24 IU/L (ref 0–40)
Albumin/Globulin Ratio: 1.5 (ref 1.2–2.2)
Albumin: 4.5 g/dL (ref 3.8–4.9)
Alkaline Phosphatase: 95 IU/L (ref 44–121)
BUN/Creatinine Ratio: 10 (ref 9–20)
BUN: 16 mg/dL (ref 6–24)
Bilirubin Total: 0.6 mg/dL (ref 0.0–1.2)
CO2: 30 mmol/L — ABNORMAL HIGH (ref 20–29)
Calcium: 10.3 mg/dL — ABNORMAL HIGH (ref 8.7–10.2)
Chloride: 95 mmol/L — ABNORMAL LOW (ref 96–106)
Creatinine, Ser: 1.61 mg/dL — ABNORMAL HIGH (ref 0.76–1.27)
Globulin, Total: 3.1 g/dL (ref 1.5–4.5)
Glucose: 110 mg/dL — ABNORMAL HIGH (ref 65–99)
Potassium: 4.9 mmol/L (ref 3.5–5.2)
Sodium: 137 mmol/L (ref 134–144)
Total Protein: 7.6 g/dL (ref 6.0–8.5)
eGFR: 49 mL/min/{1.73_m2} — ABNORMAL LOW (ref >59)

## 2021-05-19 LAB — LIPASE: Lipase: 40 U/L (ref 13–78)

## 2021-05-20 ENCOUNTER — Other Ambulatory Visit: Payer: Self-pay | Admitting: Internal Medicine

## 2021-05-25 ENCOUNTER — Ambulatory Visit (INDEPENDENT_AMBULATORY_CARE_PROVIDER_SITE_OTHER): Payer: Medicare HMO | Admitting: Family Medicine

## 2021-05-25 ENCOUNTER — Other Ambulatory Visit: Payer: Self-pay

## 2021-05-25 ENCOUNTER — Encounter: Payer: Self-pay | Admitting: Family Medicine

## 2021-05-25 VITALS — BP 90/60 | HR 69 | Temp 98.4°F | Ht 72.0 in | Wt 214.2 lb

## 2021-05-25 DIAGNOSIS — R059 Cough, unspecified: Secondary | ICD-10-CM | POA: Diagnosis not present

## 2021-05-25 DIAGNOSIS — R197 Diarrhea, unspecified: Secondary | ICD-10-CM

## 2021-05-25 DIAGNOSIS — R110 Nausea: Secondary | ICD-10-CM | POA: Diagnosis not present

## 2021-05-25 DIAGNOSIS — R112 Nausea with vomiting, unspecified: Secondary | ICD-10-CM

## 2021-05-25 DIAGNOSIS — R918 Other nonspecific abnormal finding of lung field: Secondary | ICD-10-CM

## 2021-05-25 DIAGNOSIS — M5441 Lumbago with sciatica, right side: Secondary | ICD-10-CM

## 2021-05-25 MED ORDER — HYDROCODONE-ACETAMINOPHEN 5-325 MG PO TABS: 1.0000 | ORAL_TABLET | Freq: Three times a day (TID) | ORAL | 0 refills | Status: DC | PRN

## 2021-05-25 MED ORDER — ALBUTEROL SULFATE HFA 108 (90 BASE) MCG/ACT IN AERS: 2.0000 | INHALATION_SPRAY | Freq: Four times a day (QID) | RESPIRATORY_TRACT | 0 refills | Status: DC | PRN

## 2021-05-25 MED ORDER — ONDANSETRON 8 MG PO TBDP: 8.0000 mg | ORAL_TABLET | Freq: Three times a day (TID) | ORAL | 0 refills | Status: DC | PRN

## 2021-05-25 MED ORDER — DOXYCYCLINE HYCLATE 100 MG PO TABS: 100.0000 mg | ORAL_TABLET | Freq: Two times a day (BID) | ORAL | 0 refills | Status: DC

## 2021-05-25 MED ORDER — PREDNISONE 20 MG PO TABS: 40.0000 mg | ORAL_TABLET | Freq: Every day | ORAL | 0 refills | Status: AC

## 2021-05-25 MED ORDER — AEROCHAMBER PLUS MISC: 0 refills | Status: DC
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
## 2021-05-25 NOTE — Patient Instructions (Addendum)
*  2 puffs albuterol q 4 hours as needed (four large breaths in and out of spacing chamber after putting in 2 puffs)  *start doxycycline as directed.   *prednisone in the morning with food. If you feel that breathing is better with albuterol; you can skip the prednisone.   I'll touch base with you Monday once I see results.   You can get your xrays at [facility] next week.

## 2021-05-28 ENCOUNTER — Telehealth: Payer: Self-pay | Admitting: *Deleted

## 2021-05-28 NOTE — Telephone Encounter (Signed)
-----   Message from Caren Macadam, MD sent at 05/25/2021  6:33 PM EDT ----- Looks like he is set to see neurology next week for headache, which is great. We cannot keep treating with pain medication as this is not healthy. Just wanted to make sure he keeps appointment so they can work on solution. We will work on getting back evaluated in meanwhile.

## 2021-05-28 NOTE — Telephone Encounter (Signed)
Patient informed of the message below and agreed to keep the appt this week with neurology.

## 2021-05-30 ENCOUNTER — Other Ambulatory Visit (INDEPENDENT_AMBULATORY_CARE_PROVIDER_SITE_OTHER): Payer: Medicare HMO

## 2021-05-30 ENCOUNTER — Other Ambulatory Visit: Payer: Self-pay

## 2021-05-30 ENCOUNTER — Ambulatory Visit
Admission: RE | Admit: 2021-05-30 | Discharge: 2021-05-30 | Disposition: A | Discharge status: Home / Self Care | Payer: Medicare HMO | Source: Ambulatory Visit | Attending: Family Medicine | Admitting: Family Medicine

## 2021-05-30 DIAGNOSIS — R197 Diarrhea, unspecified: Secondary | ICD-10-CM | POA: Diagnosis not present

## 2021-05-30 DIAGNOSIS — M5441 Lumbago with sciatica, right side: Secondary | ICD-10-CM

## 2021-05-30 DIAGNOSIS — M545 Low back pain, unspecified: Secondary | ICD-10-CM | POA: Diagnosis not present

## 2021-05-30 DIAGNOSIS — R059 Cough, unspecified: Secondary | ICD-10-CM | POA: Diagnosis not present

## 2021-05-30 LAB — COMPREHENSIVE METABOLIC PANEL
ALT: 31 U/L (ref 0–53)
AST: 23 U/L (ref 0–37)
Albumin: 4.1 g/dL (ref 3.5–5.2)
Alkaline Phosphatase: 61 U/L (ref 39–117)
BUN: 21 mg/dL (ref 6–23)
CO2: 30 mEq/L (ref 19–32)
Calcium: 9.7 mg/dL (ref 8.4–10.5)
Chloride: 96 mEq/L (ref 96–112)
Creatinine, Ser: 1.38 mg/dL (ref 0.40–1.50)
GFR: 56.41 mL/min — ABNORMAL LOW (ref >60.00)
Glucose, Bld: 90 mg/dL (ref 70–99)
Potassium: 4.2 mEq/L (ref 3.5–5.1)
Sodium: 133 mEq/L — ABNORMAL LOW (ref 135–145)
Total Bilirubin: 0.4 mg/dL (ref 0.2–1.2)
Total Protein: 6.8 g/dL (ref 6.0–8.3)

## 2021-05-30 LAB — CBC WITH DIFFERENTIAL/PLATELET
Basophils Absolute: 0 10*3/uL (ref 0.0–0.1)
Basophils Relative: 0.2 % (ref 0.0–3.0)
Eosinophils Absolute: 0 10*3/uL (ref 0.0–0.7)
Eosinophils Relative: 0.2 % (ref 0.0–5.0)
HCT: 40 % (ref 39.0–52.0)
Hemoglobin: 12.6 g/dL — ABNORMAL LOW (ref 13.0–17.0)
Lymphocytes Relative: 23.1 % (ref 12.0–46.0)
Lymphs Abs: 2.4 10*3/uL (ref 0.7–4.0)
MCHC: 31.5 g/dL (ref 30.0–36.0)
MCV: 73.8 fl — ABNORMAL LOW (ref 78.0–100.0)
Monocytes Absolute: 1 10*3/uL (ref 0.1–1.0)
Monocytes Relative: 9.2 % (ref 3.0–12.0)
Neutro Abs: 7 10*3/uL (ref 1.4–7.7)
Neutrophils Relative %: 67.3 % (ref 43.0–77.0)
Platelets: 159 10*3/uL (ref 150.0–400.0)
RBC: 5.42 Mil/uL (ref 4.22–5.81)
RDW: 20.6 % — ABNORMAL HIGH (ref 11.5–15.5)
WBC: 10.4 10*3/uL (ref 4.0–10.5)

## 2021-05-30 LAB — TSH: TSH: 1.31 u[IU]/mL (ref 0.35–5.50)

## 2021-05-30 LAB — BRAIN NATRIURETIC PEPTIDE: Pro B Natriuretic peptide (BNP): 519 pg/mL — ABNORMAL HIGH (ref 0.0–100.0)
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
## 2021-05-30 NOTE — Addendum Note (Signed)
Addended by: Amanda Cockayne on: 05/30/2021 08:47 AM   Modules accepted: Orders

## 2021-05-31 ENCOUNTER — Encounter: Payer: Self-pay | Admitting: Neurology

## 2021-05-31 ENCOUNTER — Ambulatory Visit (INDEPENDENT_AMBULATORY_CARE_PROVIDER_SITE_OTHER): Payer: Medicare HMO | Admitting: Neurology

## 2021-05-31 VITALS — BP 103/66 | HR 75 | Ht 72.0 in | Wt 212.8 lb

## 2021-05-31 DIAGNOSIS — M5441 Lumbago with sciatica, right side: Secondary | ICD-10-CM | POA: Diagnosis not present

## 2021-05-31 DIAGNOSIS — D352 Benign neoplasm of pituitary gland: Secondary | ICD-10-CM | POA: Diagnosis not present

## 2021-05-31 DIAGNOSIS — G8929 Other chronic pain: Secondary | ICD-10-CM | POA: Diagnosis not present

## 2021-05-31 DIAGNOSIS — G4486 Cervicogenic headache: Secondary | ICD-10-CM

## 2021-05-31 DIAGNOSIS — M542 Cervicalgia: Secondary | ICD-10-CM | POA: Diagnosis not present

## 2021-05-31 NOTE — Patient Instructions (Signed)
Referral to physical therapy for neck pain/headache and low back pain/radiculopathy If physical therapy is finished and you have no improvement, contact me Follow up 6 months or sooner as needed.

## 2021-06-08 NOTE — Progress Notes (Signed)
Remote ICD transmission.   

## 2021-06-11 ENCOUNTER — Other Ambulatory Visit: Payer: Self-pay | Admitting: Internal Medicine

## 2021-06-22 ENCOUNTER — Other Ambulatory Visit: Payer: Medicare HMO

## 2021-07-04 ENCOUNTER — Other Ambulatory Visit: Payer: Self-pay

## 2021-07-04 ENCOUNTER — Ambulatory Visit (INDEPENDENT_AMBULATORY_CARE_PROVIDER_SITE_OTHER): Payer: Medicare HMO | Admitting: Endocrinology

## 2021-07-04 VITALS — BP 80/64 | HR 60 | Ht 72.0 in | Wt 219.2 lb

## 2021-07-04 DIAGNOSIS — E119 Type 2 diabetes mellitus without complications: Secondary | ICD-10-CM | POA: Diagnosis not present

## 2021-07-04 DIAGNOSIS — E237 Disorder of pituitary gland, unspecified: Secondary | ICD-10-CM

## 2021-07-04 LAB — POCT GLYCOSYLATED HEMOGLOBIN (HGB A1C): Hemoglobin A1C: 6.5 % — AB (ref 4.0–5.6)

## 2021-07-04 MED ORDER — DEXAMETHASONE 1 MG PO TABS: 1.0000 mg | ORAL_TABLET | ORAL | 0 refills | Status: DC

## 2021-07-04 NOTE — Patient Instructions (Addendum)
You should do a "dexamethasone suppression test."  For this, you would take dexamethasone 1 mg at 10 pm (I have sent a prescription to your pharmacy), then come in for a "cortisol" blood test the next morning before 9 am.  You do not need to be fasting for this test.   You can have the MRI repeated if you want to, but I think that is unlikely to help. I would be happy to see you back here as needed.
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
## 2021-07-10 ENCOUNTER — Other Ambulatory Visit: Payer: Self-pay

## 2021-07-10 ENCOUNTER — Other Ambulatory Visit (INDEPENDENT_AMBULATORY_CARE_PROVIDER_SITE_OTHER): Payer: Medicare HMO

## 2021-07-10 ENCOUNTER — Ambulatory Visit
Admission: RE | Admit: 2021-07-10 | Discharge: 2021-07-10 | Disposition: A | Discharge status: Home / Self Care | Payer: Medicare HMO | Source: Ambulatory Visit | Attending: Family Medicine | Admitting: Family Medicine

## 2021-07-10 DIAGNOSIS — E237 Disorder of pituitary gland, unspecified: Secondary | ICD-10-CM

## 2021-07-10 DIAGNOSIS — R918 Other nonspecific abnormal finding of lung field: Secondary | ICD-10-CM

## 2021-07-10 DIAGNOSIS — R911 Solitary pulmonary nodule: Secondary | ICD-10-CM | POA: Diagnosis not present

## 2021-07-10 LAB — CORTISOL: Cortisol, Plasma: 2.8 ug/dL

## 2021-07-11 ENCOUNTER — Other Ambulatory Visit: Payer: Self-pay | Admitting: Family Medicine

## 2021-07-14 DIAGNOSIS — Z20822 Contact with and (suspected) exposure to covid-19: Secondary | ICD-10-CM | POA: Diagnosis not present

## 2021-07-14 LAB — INSULIN-LIKE GROWTH FACTOR
IGF-I, LC/MS: 179 ng/mL (ref 50–317)
Z-Score (Male): 0.7 SD (ref ?–2.0)

## 2021-07-14 LAB — ACTH: C206 ACTH: 14 pg/mL (ref 6–50)

## 2021-07-17 ENCOUNTER — Telehealth: Payer: Self-pay

## 2021-07-17 NOTE — Telephone Encounter (Signed)
Patient called  requesting Rx refill  Accu-Chek Softclix Lancets lancets ACCU-CHEK GUIDE test strip JARDIANCE 10 MG TABS tablet

## 2021-07-18 MED ORDER — JARDIANCE 10 MG PO TABS: 10.0000 mg | ORAL_TABLET | Freq: Every day | ORAL | 1 refills | Status: DC

## 2021-07-18 MED ORDER — ACCU-CHEK GUIDE VI STRP: ORAL_STRIP | 1 refills | Status: DC

## 2021-07-18 MED ORDER — ACCU-CHEK SOFTCLIX LANCETS MISC: 1 refills | Status: DC

## 2021-07-18 NOTE — Telephone Encounter (Signed)
Rx done. 

## 2021-07-18 NOTE — Telephone Encounter (Signed)
Ok for refills

## 2021-07-19 ENCOUNTER — Other Ambulatory Visit: Payer: Self-pay | Admitting: Internal Medicine

## 2021-07-20 LAB — ALPHA SUBUNIT (FREE): Alpha Subunit (Free): 1.2 ng/mL

## 2021-07-20 LAB — ARGININE VASOPRESSIN HORMONE
ADH: <0.8 pg/mL (ref 0.0–4.7)
Osmolality Meas: 288 mOsmol/kg (ref 275–295)

## 2021-07-26 ENCOUNTER — Telehealth: Payer: Self-pay | Admitting: Family Medicine

## 2021-07-26 ENCOUNTER — Other Ambulatory Visit: Payer: Self-pay | Admitting: Internal Medicine

## 2021-07-26 NOTE — Telephone Encounter (Signed)
Spoke with the patient and informed him it looks like [facility]-Dr Desai's office sent a refill already and to contact their office for a follow up appt.

## 2021-07-26 NOTE — Telephone Encounter (Signed)
Pt call and stated he need a refill on   omeprazole (PRILOSEC) 40 MG capsule sent to  [facility] [place] [facility] [place] Phone:  [phone]  Fax:  [phone]

## 2021-08-01 ENCOUNTER — Telehealth: Payer: Self-pay

## 2021-08-01 NOTE — Telephone Encounter (Signed)
[facility] alert- Optivol crossed threshold 9/10 and is ongoing, last HF clinic visit June  Pt meds include Torsemide 20mg  daily, Spironolactone 25g daily.    Spoke with pt.  He reports he has felt increased SOB with exertion lately.  He reports he previously was taking Torsemide twice daily but had to decrease dosage due to dehydration.  Advised pt given his current symptoms and fluid status, he should go back to twice daily for up to 3 days.  If condition worsens or fails to improve report to Dr. Cherlyn Cushing office.

## 2021-08-09 ENCOUNTER — Other Ambulatory Visit: Payer: Self-pay | Admitting: Family Medicine

## 2021-08-16 ENCOUNTER — Ambulatory Visit (INDEPENDENT_AMBULATORY_CARE_PROVIDER_SITE_OTHER): Payer: Medicare HMO | Admitting: Internal Medicine

## 2021-08-16 ENCOUNTER — Encounter: Payer: Self-pay | Admitting: Internal Medicine

## 2021-08-16 ENCOUNTER — Other Ambulatory Visit: Payer: Self-pay

## 2021-08-16 ENCOUNTER — Ambulatory Visit (INDEPENDENT_AMBULATORY_CARE_PROVIDER_SITE_OTHER): Payer: Medicare HMO

## 2021-08-16 VITALS — BP 104/74 | HR 63 | Temp 98.3°F | Ht 72.0 in | Wt 217.6 lb

## 2021-08-16 DIAGNOSIS — I428 Other cardiomyopathies: Secondary | ICD-10-CM

## 2021-08-16 DIAGNOSIS — I5042 Chronic combined systolic (congestive) and diastolic (congestive) heart failure: Secondary | ICD-10-CM

## 2021-08-16 DIAGNOSIS — K219 Gastro-esophageal reflux disease without esophagitis: Secondary | ICD-10-CM | POA: Diagnosis not present

## 2021-08-16 DIAGNOSIS — G4733 Obstructive sleep apnea (adult) (pediatric): Secondary | ICD-10-CM | POA: Diagnosis not present

## 2021-08-16 DIAGNOSIS — Z23 Encounter for immunization: Secondary | ICD-10-CM | POA: Diagnosis not present

## 2021-08-16 LAB — CUP PACEART REMOTE DEVICE CHECK
Battery Remaining Longevity: 84 mo
Battery Voltage: 2.99 V
Brady Statistic RV Percent Paced: 0.84 %
Date Time Interrogation Session: 20221013043825
HighPow Impedance: 65 Ohm
Implantable Lead Implant Date: 20180517
Implantable Lead Location: 753860
Implantable Pulse Generator Implant Date: 20180517
Lead Channel Impedance Value: 342 Ohm
Lead Channel Impedance Value: 418 Ohm
Lead Channel Pacing Threshold Amplitude: 0.625 V
Lead Channel Pacing Threshold Pulse Width: 0.4 ms
Lead Channel Sensing Intrinsic Amplitude: 11.5 mV
Lead Channel Sensing Intrinsic Amplitude: 11.5 mV
Lead Channel Setting Pacing Amplitude: 2.5 V
Lead Channel Setting Pacing Pulse Width: 0.4 ms
Lead Channel Setting Sensing Sensitivity: 0.3 mV

## 2021-08-16 MED ORDER — OMEPRAZOLE 40 MG PO CPDR: 40.0000 mg | DELAYED_RELEASE_CAPSULE | Freq: Every day | ORAL | 3 refills | Status: DC
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
## 2021-08-16 NOTE — Patient Instructions (Addendum)
Please schedule follow up scheduled with myself in 3 months.  If my schedule is not open yet, we will contact you with a reminder closer to that time.  Before your next visit I would like you to have: Sleep study  Let me know what your company is for your CPAP

## 2021-08-19 ENCOUNTER — Other Ambulatory Visit: Payer: Self-pay | Admitting: Cardiology

## 2021-08-24 NOTE — Progress Notes (Signed)
Remote ICD transmission.   

## 2021-08-29 ENCOUNTER — Telehealth: Payer: Self-pay | Admitting: Internal Medicine

## 2021-08-29 NOTE — Telephone Encounter (Signed)
Called patient to let him know that we have received his message but he did not answer. I have updated his chart. Will route to Dr. Shearon Stalls as Juluis Rainier for his DME name.

## 2021-09-26 ENCOUNTER — Other Ambulatory Visit: Payer: Self-pay | Admitting: Family Medicine

## 2021-10-04 NOTE — Progress Notes (Signed)
Cardiology Office Note   Date:  10/05/2021   ID:  Bruce Robertson, DOB 1961-04-02, MRN [id number]  PCP:  Caren Macadam, MD  Cardiologist:   Minus Breeding, MD   Chief Complaint  Patient presents with   Cardiomyopathy       History of Present Illness: Bruce Robertson is a 60 y.o. 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  His course over the years has been difficult because of social issues, occasional inability to obtain meds and inconsistent follow up with appts.   He has been seen at [facility] by the Heart Failure Service it looks like the last time was June of this year..   The HF Clinic at [facility] suggested possible non compaction.  His last EF was 15 -20.  However, he responded well to therapy and seemed to be tolerating med titration with Entresto.   He also has had some GI bleeding but this was related to hemorrhoids.  He had surgery on this this year.  He has had no further bleeding.  The patient has episodes of days where he just does not feel well.  It might happen 3 or 4 days in a row where he feels fatigued and he might get short of breath.  He says that this happens in spells and he really cannot probably ascribe it to some salt intake.  For instance the most recent one was with some buttered popcorn.  I went back and looked at his device interrogation and he does have days when his impedance is down and his volume status up episodically.  He then normalizes.  Currently he is actually feeling okay.  He is not having any new PND or orthopnea.  He is not having any new palpitations, presyncope or syncope.  He has not had any new chest pressure, neck or arm discomfort.    Of note he has been taking his Demadex 20 mg two tablets daily because of increased shortness of breath and edema.  He  says he does not do well if he only takes the 1.   Past Medical History:  Diagnosis Date   AICD (automatic cardioverter/defibrillator) present    Anxiety 08/20/2016   Cardiomyopathy    Nonischemic. EF has been about 45%. S/P CABG.;  b.  Echo 4/14: EF 25%, global HK with inf and mid apical AK, restrictive physiology with E/e' > 15 (elevated LV filling pressure), trivial AI/MR, mild to mod LAE, mild RVE, mild reduced RVSF, mild RAE, mod TR, PASP 74 (severe pulmonary HTN)    CHF (congestive heart failure) (HCC)    CHF exacerbation (HCC) 07/31/2016   Colon polyps 2002   Coronary artery disease    Diabetes mellitus without complication (HCC)    Type II   Dyslipidemia    hx (03/20/2017)   GERD (gastroesophageal reflux disease)    History of bleeding peptic ulcer    History of echocardiogram    Echo 10/16:  EF 25-30%, poss non-compaction, diff HK with inf-lat and apical HK, restrictive physio, mild AI, severe LAE, mild RVE with mild reduced RVSF, PASP 42 mmHg   Hyperplastic rectal polyp    Moderate episode of recurrent major depressive disorder ([place]) 08/20/2016   09/14/20 - not current   OSA on CPAP    Mild   Pneumonia  09/2018   Tobacco abuse    Remote    Past Surgical History:  Procedure Laterality Date   CARDIAC CATHETERIZATION  2007;  2009   CARDIAC DEFIBRILLATOR PLACEMENT  03/20/2017   COLONOSCOPY WITH PROPOFOL N/A 01/21/2019   Procedure: COLONOSCOPY WITH PROPOFOL;  Surgeon: Milus Banister, MD;  Location: [facility];  Service: Endoscopy;  Laterality: N/A;   CORONARY ARTERY BYPASS GRAFT  2007   Had a left main dissection after catheterization. Underwent a saphenous vein graft to the LAD and a saphenous vein graft to obtuse marginal.   HEMORRHOID SURGERY N/A 08/23/2020   Procedure: HEMORRHOIDECTOMY WITH LIGATION AND HEMORRHOIDOPEXY;  Surgeon: Michael Boston, MD;  Location: [facility];  Service: General;  Laterality: N/A;   ICD IMPLANT N/A 03/20/2017   Procedure: ICD Implant;  Surgeon:  Constance Haw, MD;  Location: [place] [facility];  Service: Cardiovascular;  Laterality: N/A;   PATELLAR TENDON REPAIR Right 2005   PATELLAR TENDON REPAIR Right 09/21/2020   Procedure: PATELLA TENDON REPAIR;  Surgeon: Leandrew Koyanagi, MD;  Location: [place];  Service: Orthopedics;  Laterality: Right;   POLYPECTOMY  01/21/2019   Procedure: POLYPECTOMY;  Surgeon: Milus Banister, MD;  Location: [facility];  Service: Endoscopy;;   RECTAL EXAM UNDER ANESTHESIA Left 08/23/2020   Procedure: RECTAL EXAM UNDER ANESTHESIA;  Surgeon: Michael Boston, MD;  Location: [facility];  Service: General;  Laterality: Left;     Current Outpatient Medications  Medication Sig Dispense Refill   ACCU-CHEK GUIDE test strip Use as directed-patient needs an appt 100 each 1   Accu-Chek Softclix Lancets lancets Use as directed 100 each 1   albuterol (VENTOLIN HFA) 108 (90 Base) MCG/ACT inhaler Inhale 2 puffs into the lungs every 6 (six) hours as needed for wheezing or shortness of breath. 8 g 0   blood glucose meter kit and supplies KIT Dispense based on patient and insurance preference. Use up to four times daily as directed. 1 each 0   Blood Pressure Monitoring (BLOOD PRESSURE CUFF) MISC 1 Device by Does not apply route daily as needed. 1 each 0   carvedilol (COREG) 12.5 MG tablet TAKE 1 TABLET BY MOUTH TWICE DAILY . APPOINTMENT REQUIRED FOR FUTURE REFILLS 60 tablet 11   cyclobenzaprine (FLEXERIL) 10 MG tablet Take 1 tablet (10 mg total) by mouth 2 (two) times daily as needed for muscle spasms. 30 tablet 0   ELIQUIS 5 MG TABS tablet Take 1 tablet by mouth twice daily 180 tablet 3   ENTRESTO 24-26 MG TAKE 1 TABLET BY MOUTH IN THE MORNING AND AT BEDTIME 180 tablet 1   JARDIANCE 10 MG TABS tablet Take 1 tablet by mouth once daily 90 tablet 1   Multiple Vitamin (MULTIVITAMIN WITH MINERALS) TABS tablet Take 1 tablet by mouth daily.     sildenafil (VIAGRA) 50 MG tablet Take 1 tablet (50 mg total) by mouth daily as needed for  erectile dysfunction. 10 tablet 5   simvastatin (ZOCOR) 40 MG tablet TAKE 1 TABLET BY MOUTH AT BEDTIME 90 tablet 0   Spacer/Aero-Holding Chambers (AEROCHAMBER PLUS) inhaler Use as instructed 1 each 0   spironolactone (ALDACTONE) 25 MG tablet Take 25 mg by mouth daily.      torsemide (DEMADEX) 20 MG tablet Take 2 tablets (40 mg total) by mouth daily. 180 tablet 3   No current facility-administered medications for this visit.    Allergies:   Patient has no known allergies.     ROS:  Please see  the history of present illness.   Otherwise, review of systems are positive for none.   All other systems are reviewed and negative.    PHYSICAL EXAM: VS:  BP (!) 93/59   Pulse (!) 58   Ht 6' (1.829 m)   Wt 215 lb 12.8 oz (97.9 kg)   SpO2 97%   BMI 29.27 kg/m  , BMI Body mass index is 29.27 kg/m. GENERAL:  Well appearing NECK:  No jugular venous distention, waveform within normal limits, carotid upstroke brisk and symmetric, no bruits, no thyromegaly LUNGS:  Clear to auscultation bilaterally CHEST:  Well healed sternotomy scar. HEART:  PMI not displaced or sustained,S1 and S2 within normal limits, no S3, no S4, no clicks, no rubs, no murmurs ABD:  Flat, positive bowel sounds normal in frequency in pitch, no bruits, no rebound, no guarding, no midline pulsatile mass, no hepatomegaly, no splenomegaly EXT:  2 plus pulses throughout, no edema, no cyanosis no clubbing   EKG:  EKG is  ordered today. Sinus bradycardia, rate 58, axis within normal limits, interventricular conduction delay, premature ventricular contractions, no acute ST-T wave changes.  Recent Labs: 01/26/2021: Brain Natriuretic Peptide 663; Brain Natriuretic Peptide CANCELED 05/30/2021: ALT 31; BUN 21; Creatinine, Ser 1.38; Hemoglobin 12.6; Platelets 159.0; Potassium 4.2; Pro B Natriuretic peptide (BNP) 519.0; Sodium 133; TSH 1.31    Lipid Panel    Component Value Date/Time   CHOL 142 12/01/2020 1033   TRIG 88.0 12/01/2020  1033   HDL 44.90 12/01/2020 1033   CHOLHDL 3 12/01/2020 1033   VLDL 17.6 12/01/2020 1033   LDLCALC 79 12/01/2020 1033   LDLCALC 92 02/11/2020 1641   LDLDIRECT 166.4 05/17/2010 0905      Wt Readings from Last 3 Encounters:  10/05/21 215 lb 12.8 oz (97.9 kg)  08/16/21 217 lb 9.6 oz (98.7 kg)  07/04/21 219 lb 3.2 oz (99.4 kg)      Other studies Reviewed: Additional studies/ records that were reviewed today include: Labs Review of the above records demonstrates:  Please see elsewhere in the note.     Current medicines are reviewed at length with the patient today.  The patient does not have concerns regarding medicines.  The following changes have been made:  None  Labs/ tests ordered today include:  None  Orders Placed This Encounter  Procedures   Basic metabolic panel   EKG [id number]     ASSESSMENT AND PLAN:    CHRONIC SYSTOLIC AND DIASTOLIC HF:    Today he seems to be euvolemic.  He is on good meds at optimal medical therapy and his blood pressure would not allow titration.  I reviewed his impedance from his device.  We talked at great length about better salt compliance.    GI BLEEDING: His hemoglobin was stable at the last check earlier this year.  He has no active bleeding.  No change in therapy.   PAF:  He one episode of atrial fib for six min on his last device check in October.    ICD:  He is up to date with his ICD as above.  I reviewed this note as above  OSA:    He is wearing his device only on some days.   He is going to get a sleep study soon because he might need a new mask.   S/p CABG:   No change in therapy  HLD: LDL was 79.  No change in therapy.    DM:    A1c  was 6.5 which is down from A1c 6.7.  No change in therapy.  CKD stage 3:    Creat was 1.38.  No change in therapy.   Signed, Minus Breeding, MD  10/05/2021 2:23 PM    [place]

## 2021-10-05 ENCOUNTER — Encounter: Payer: Self-pay | Admitting: Cardiology

## 2021-10-05 ENCOUNTER — Ambulatory Visit (INDEPENDENT_AMBULATORY_CARE_PROVIDER_SITE_OTHER): Payer: Medicare HMO | Admitting: Cardiology

## 2021-10-05 ENCOUNTER — Other Ambulatory Visit: Payer: Self-pay

## 2021-10-05 VITALS — BP 93/59 | HR 58 | Ht 72.0 in | Wt 215.8 lb

## 2021-10-05 DIAGNOSIS — E785 Hyperlipidemia, unspecified: Secondary | ICD-10-CM

## 2021-10-05 DIAGNOSIS — Z951 Presence of aortocoronary bypass graft: Secondary | ICD-10-CM

## 2021-10-05 DIAGNOSIS — I420 Dilated cardiomyopathy: Secondary | ICD-10-CM

## 2021-10-05 DIAGNOSIS — I5042 Chronic combined systolic (congestive) and diastolic (congestive) heart failure: Secondary | ICD-10-CM | POA: Diagnosis not present

## 2021-10-05 DIAGNOSIS — I480 Paroxysmal atrial fibrillation: Secondary | ICD-10-CM | POA: Diagnosis not present

## 2021-10-05 DIAGNOSIS — E1122 Type 2 diabetes mellitus with diabetic chronic kidney disease: Secondary | ICD-10-CM | POA: Diagnosis not present

## 2021-10-05 DIAGNOSIS — Z4502 Encounter for adjustment and management of automatic implantable cardiac defibrillator: Secondary | ICD-10-CM

## 2021-10-05 DIAGNOSIS — N183 Chronic kidney disease, stage 3 unspecified: Secondary | ICD-10-CM

## 2021-10-05 MED ORDER — TORSEMIDE 20 MG PO TABS: 40.0000 mg | ORAL_TABLET | Freq: Every day | ORAL | 3 refills | Status: DC

## 2021-10-05 NOTE — Patient Instructions (Signed)
Medication Instructions:  Your Physician recommend you continue on your current medication as directed.    *If you need a refill on your cardiac medications before your next appointment, please call your pharmacy*   Lab Work: today BMET If you have labs (blood work) drawn today and your tests are completely normal, you will receive your results only by: [place] (if you have MyChart) OR A paper copy in the mail If you have any lab test that is abnormal or we need to change your treatment, we will call you to review the results.    Follow-Up: At [facility], you and your health needs are our priority.  As part of our continuing mission to provide you with exceptional heart care, we have created designated Provider Care Teams.  These Care Teams include your primary Cardiologist (physician) and Advanced Practice Providers (APPs -  Physician Assistants and Nurse Practitioners) who all work together to provide you with the care you need, when you need it.  We recommend signing up for the patient portal called "MyChart".  Sign up information is provided on this After Visit Summary.  MyChart is used to connect with patients for Virtual Visits (Telemedicine).  Patients are able to view lab/test results, encounter notes, upcoming appointments, etc.  Non-urgent messages can be sent to your provider as well.   To learn more about what you can do with MyChart, go to [email or web address].    Your next appointment:   6 month(s)  The format for your next appointment:   In Person  Provider:   Minus Breeding, MD

## 2021-10-06 LAB — BASIC METABOLIC PANEL
BUN/Creatinine Ratio: 11 (ref 9–20)
BUN: 18 mg/dL (ref 6–24)
CO2: 28 mmol/L (ref 20–29)
Calcium: 9.3 mg/dL (ref 8.7–10.2)
Chloride: 97 mmol/L (ref 96–106)
Creatinine, Ser: 1.67 mg/dL — ABNORMAL HIGH (ref 0.76–1.27)
Glucose: 91 mg/dL (ref 70–99)
Potassium: 3.9 mmol/L (ref 3.5–5.2)
Sodium: 139 mmol/L (ref 134–144)
eGFR: 47 mL/min/{1.73_m2} — ABNORMAL LOW (ref >59)

## 2021-10-08 ENCOUNTER — Ambulatory Visit (HOSPITAL_BASED_OUTPATIENT_CLINIC_OR_DEPARTMENT_OTHER): Payer: Medicare HMO | Attending: Internal Medicine | Admitting: Pulmonary Disease

## 2021-10-08 ENCOUNTER — Other Ambulatory Visit: Payer: Self-pay

## 2021-10-08 DIAGNOSIS — R0683 Snoring: Secondary | ICD-10-CM | POA: Diagnosis not present

## 2021-10-08 DIAGNOSIS — Z9989 Dependence on other enabling machines and devices: Secondary | ICD-10-CM | POA: Diagnosis not present

## 2021-10-08 DIAGNOSIS — G4733 Obstructive sleep apnea (adult) (pediatric): Secondary | ICD-10-CM | POA: Insufficient documentation

## 2021-10-08 DIAGNOSIS — G4731 Primary central sleep apnea: Secondary | ICD-10-CM | POA: Insufficient documentation

## 2021-10-08 DIAGNOSIS — I493 Ventricular premature depolarization: Secondary | ICD-10-CM | POA: Insufficient documentation

## 2021-10-10 ENCOUNTER — Telehealth: Payer: Self-pay | Admitting: Internal Medicine

## 2021-10-10 ENCOUNTER — Other Ambulatory Visit: Payer: Self-pay

## 2021-10-10 DIAGNOSIS — G4733 Obstructive sleep apnea (adult) (pediatric): Secondary | ICD-10-CM | POA: Diagnosis not present

## 2021-10-10 NOTE — Telephone Encounter (Signed)
Please order bipap  25/20 cm H2O with a Medium size [employer] Full Face Mask Simplus mask and heated humidification. AutoBiPAP with EPAP min 6, PS +4 cm, IPAP max 25 cm should be used.

## 2021-10-10 NOTE — Procedures (Signed)
Patient Name: Bruce Robertson, Bruce Robertson Date: 10/08/2021 Gender: Male D.O.B: [birth year] Age (years): 46 Referring Provider: Lenice Llamas MD Height (inches): 72 Interpreting Physician: Kara Mead MD, ABSM Weight (lbs): 205 RPSGT: Carolin Coy BMI: 28 MRN: [id number] Neck Size: 16.00 <br> <br> CLINICAL INFORMATION The patient is referred for a BiPAP titration to treat sleep apnea. In 2017 he was diagnosed with  moderately severe sleep apnea with an overall AHI of 26.7. There was a significant positional component with supine sleep AHI at 43.3 per hour. Central apneas noted on bipap pressures greater than 12/8    SLEEP STUDY TECHNIQUE As per the AASM Manual for the Scoring of Sleep and Associated Events v2.3 (April 2016) with a hypopnea requiring 4% desaturations.  The channels recorded and monitored were frontal, central and occipital EEG, electrooculogram (EOG), submentalis EMG (chin), nasal and oral airflow, thoracic and abdominal wall motion, anterior tibialis EMG, snore microphone, electrocardiogram, and pulse oximetry. Bilevel positive airway pressure (BPAP) was initiated at the beginning of the study and titrated to treat sleep-disordered breathing.  MEDICATIONS Medications self-administered by patient taken the night of the study : N/A  RESPIRATORY PARAMETERS Optimal IPAP Pressure (cm): 25 AHI at Optimal Pressure (/hr) 0 Optimal EPAP Pressure (cm): 20   Overall Minimal O2 (%): 89.0 Minimal O2 at Optimal Pressure (%): 95.0 SLEEP ARCHITECTURE Start Time: 10:43:15 PM Stop Time: 4:50:28 AM Total Time (min): 367.2 Total Sleep Time (min): 341 Sleep Latency (min): 9.7 Sleep Efficiency (%): 92.9% REM Latency (min): 38.5 WASO (min): 16.6 Stage N1 (%): 16.6% Stage N2 (%): 55.6% Stage N3 (%): 0.0% Stage R (%): 27.9 Supine (%): 40.61 Arousal Index (/hr): 16.9     CARDIAC DATA The 2 lead EKG demonstrated sinus rhythm. The mean heart rate was 51.5 beats per minute. Other EKG findings  include: PVCs.  LEG MOVEMENT DATA The total Periodic Limb Movements of Sleep (PLMS) were 0. The PLMS index was 0.0. A PLMS index of <15 is considered normal in adults.  IMPRESSIONS - An optimal PAP pressure was selected for this patient ( 25 / 20 cm of water) - Moderate Central Sleep Apnea was noted during this titration (CAI = 16.2/h). Centrals emerged on CPAP 10 cm & persisted on BiPAP pressures upto 20/16 cm - Mild oxygen desaturations were observed during this titration (min O2 = 89.0%). - The patient snored with soft snoring volume. - 2-lead EKG demonstrated: PVCs - Clinically significant periodic limb movements were not noted during this study. Arousals associated with PLMs were rare.   DIAGNOSIS - Obstructive Sleep Apnea (G47.33)   RECOMMENDATIONS - Trial of BiPAP therapy on 25/20 cm H2O with a Medium size Fisher[employer]kel Full Face Mask Simplus mask and heated humidification. Preferably, autoBiPAP with EPAP min 6, PS +4 cm, IPAP max 25 cm should be used. Unfortunately ASV mode may be contraindicated due to low EF - Avoid alcohol, sedatives and other CNS depressants that may worsen sleep apnea and disrupt normal sleep architecture. - Sleep hygiene should be reviewed to assess factors that may improve sleep quality. - Weight management and regular exercise should be initiated or continued. - Return to Sleep Center for re-evaluation after 4 weeks of therapy   Kara Mead MD Board Certified in [place]

## 2021-10-11 ENCOUNTER — Encounter: Payer: Self-pay | Admitting: *Deleted

## 2021-10-19 ENCOUNTER — Telehealth: Payer: Self-pay | Admitting: Internal Medicine

## 2021-10-19 NOTE — Telephone Encounter (Signed)
Patient is aware of below message and voiced his understanding.  Nothing further needed at this time.   

## 2021-10-19 NOTE — Telephone Encounter (Signed)
Pt tested positive for covid this am. Pt states he has been coughing up mucus. Pt states he is very sob and has been vomiting a lot and "diarrhead that's been black." Pt states he does have an appt to be retested for covid at [facility] today. Pt states the symptoms started Saturday,but chose to test today. Please advise [phone]

## 2021-10-19 NOTE — Telephone Encounter (Signed)
Unfortunately he is outside the window for any anti-viral therapies - those have to be given within 5 days of symptom onset. I recommend keeping down liquids and supportive care as best as he can. Tylenol for fever and body aches. Plenty of rest. If oxygen levels are dropping below 90% or he cannot tolerate liquids,  he should go to emergency room.

## 2021-10-19 NOTE — Telephone Encounter (Signed)
Primary Pulmonologist: Shearon Stalls Last office visit and with whom: 08/16/2021 Shearon Stalls What do we see them for (pulmonary problems): Gastroesophageal reflux and OSA on bipap Last OV assessment/plan:  Assessment:  Dyspnea on exertion multifactorial from heart failure HFrEF, OSA OSA on BIPAP GERD     Plan/Recommendations: He is not sleeping well. He has lost substantial weight since his last sleep study. Will order CPAP titration study and prescribe new supplies and settings as needed    We discussed instituting a consistent exercise routine starting with walking regularly. This is paramount for his cardiopulmonary health. Follow up with HF locally and at [facility]. They will guide when repeat EF testing is necessary.    Will refill PPI for reflux today.    Will give a flu shot today.      Return to Care: Return in about 3 months (around 11/16/2021).     Lenice Llamas, MD Pulmonary and [place]                Patient Instructions by Spero Geralds, MD at 08/16/2021 4:30 PM  Author: Spero Geralds, MD Author Type: Physician Filed: 08/16/2021  4:47 PM  Note Status: Addendum Mickle Mallory: Cosign Not Required Encounter Date: 08/16/2021  Editor: Spero Geralds, MD (Physician)      Prior Versions: 1. Spero Geralds, MD (Physician) at 08/16/2021  4:45 PM - Signed    Please schedule follow up scheduled with myself in 3 months.  If my schedule is not open yet, we will contact you with a reminder closer to that time.   Before your next visit I would like you to have: Sleep study   Let me know what your company is for your CPAP       Orthostatic Vitals Recorded in This Encounter   08/16/2021  1619     BP Location: Left Arm  Cuff Size: Normal   Instructions    Return in about 3 months (around 11/16/2021).  Please schedule follow up scheduled with myself in 3 months.  If my schedule is not open yet, we will contact you with a  reminder closer to that time.   Before your next visit I would like you to have: Sleep study   Let me know what your company is for your CPAP       Was appointment offered to patient (explain)?  No, covid positive   Reason for call: Tested positive for Covid 10/19/21.  Very sob, coughing up brown mucus.  Has been vomiting a lot and had a lot of diarrhea that has been black. Vomiting started Monday (12/12).  SOB and mucous started Saturday (12/10).  Able to now keep down some food/liquids yesterday (this is the first day).  He took Pepto and then stool turned black.  Taking mucinex.  Denies fever, has had chills and body aches that lasted 2 days.  States he is still cold.  Using Albuterol 2-3 times per day and gets relief.  He feels like is coughing all the time and has made his throat sore.  Dr. Shearon Stalls, please advise. Thank you.  (examples of things to ask: : When did symptoms start? Fever? Cough? Productive? Color to sputum? More sputum than usual? Wheezing? Have you needed increased oxygen? Are you taking your respiratory medications? What over the counter measures have you tried?)  No Known Allergies  Immunization History  Administered Date(s) Administered   Influenza,inj,Quad PF,6+ Mos 11/25/2018, 08/16/2021   Influenza-Unspecified 08/23/2019  PFIZER(Purple Top)SARS-COV-2 Vaccination 05/12/2020, 06/03/2020   Pneumococcal Conjugate-13 11/25/2018

## 2021-11-06 ENCOUNTER — Telehealth: Payer: Self-pay | Admitting: Family Medicine

## 2021-11-06 NOTE — Telephone Encounter (Signed)
Spoke with the patient for more information.  Patient complains of severe shortness of breath, even to just walk across the room, states he noticed while lifting a small trash can also, sudden body movements and loss of bladder function for the past 2 days.  Patient stated he is feeling worse, coughing up sputum and questioned if he could be given an inhaler as this helped previously.  I advised the patient he should go to the emergency room immediately for evaluation to receive the care needed as we would not be able to provide this in the office.  Patient is aware the appt will remain as scheduled on 1/6 just in case this is recommended after the ER visit today.  Message sent to PCP.

## 2021-11-06 NOTE — Telephone Encounter (Signed)
Patient called in to schedule an appointment due to slight SOB, congestion, patient states tested positive for COVID roughly around the 19th of Dec.  Patient has concerns that the symptoms are still lingering

## 2021-11-07 NOTE — Telephone Encounter (Signed)
Attempted to call patient. Mailbox is full and cannot accept messages; there was no answer.

## 2021-11-07 NOTE — Telephone Encounter (Signed)
Spoke with the patient, he stated he did not go the ER as he did not want to be admitted and he only has difficulty breathing.  Message sent to PCP.

## 2021-11-07 NOTE — Telephone Encounter (Signed)
Agree with ER.I don't see that he went to ER? Please see if you can reach him. Just don't want him waiting until appointment to be seen. If he went to ER outside of our system please get notes.

## 2021-11-08 ENCOUNTER — Other Ambulatory Visit: Payer: Self-pay | Admitting: Family Medicine

## 2021-11-08 NOTE — Telephone Encounter (Signed)
Pt is returning a call from 11-07-2021. Pt does have an appt on 11-09-2021

## 2021-11-09 ENCOUNTER — Ambulatory Visit: Payer: Medicare HMO | Admitting: Family Medicine

## 2021-11-09 NOTE — Progress Notes (Unsigned)
Late cancel

## 2021-11-12 ENCOUNTER — Ambulatory Visit: Payer: Medicare HMO | Admitting: Internal Medicine

## 2021-11-12 ENCOUNTER — Other Ambulatory Visit: Payer: Self-pay | Admitting: Family Medicine

## 2021-11-12 DIAGNOSIS — R059 Cough, unspecified: Secondary | ICD-10-CM

## 2021-11-15 ENCOUNTER — Ambulatory Visit (INDEPENDENT_AMBULATORY_CARE_PROVIDER_SITE_OTHER): Payer: Medicare HMO

## 2021-11-15 DIAGNOSIS — I420 Dilated cardiomyopathy: Secondary | ICD-10-CM

## 2021-11-15 LAB — CUP PACEART REMOTE DEVICE CHECK
Battery Remaining Longevity: 81 mo
Battery Voltage: 2.99 V
Brady Statistic RV Percent Paced: 0.58 %
Date Time Interrogation Session: 20230112001701
HighPow Impedance: 64 Ohm
Implantable Lead Implant Date: 20180517
Implantable Lead Location: 753860
Implantable Pulse Generator Implant Date: 20180517
Lead Channel Impedance Value: 361 Ohm
Lead Channel Impedance Value: 456 Ohm
Lead Channel Pacing Threshold Amplitude: 0.625 V
Lead Channel Pacing Threshold Pulse Width: 0.4 ms
Lead Channel Sensing Intrinsic Amplitude: 10.5 mV
Lead Channel Sensing Intrinsic Amplitude: 10.5 mV
Lead Channel Setting Pacing Amplitude: 2.5 V
Lead Channel Setting Pacing Pulse Width: 0.4 ms
Lead Channel Setting Sensing Sensitivity: 0.3 mV

## 2021-11-19 ENCOUNTER — Other Ambulatory Visit: Payer: Self-pay | Admitting: Family Medicine

## 2021-11-20 ENCOUNTER — Ambulatory Visit (INDEPENDENT_AMBULATORY_CARE_PROVIDER_SITE_OTHER): Payer: Medicare HMO

## 2021-11-20 VITALS — BP 90/59 | HR 59 | Temp 98.0°F | Ht 72.0 in | Wt 215.4 lb

## 2021-11-20 DIAGNOSIS — Z0000 Encounter for general adult medical examination without abnormal findings: Secondary | ICD-10-CM

## 2021-11-20 NOTE — Patient Instructions (Signed)
[name] , Thank you for taking time to come for your Medicare Wellness Visit. I appreciate your ongoing commitment to your health goals. Please review the following plan we discussed and let me know if I can assist you in the future.   Screening recommendations/referrals: Colonoscopy: completed 01/21/2019 Recommended yearly ophthalmology/optometry visit for glaucoma screening and checkup Recommended yearly dental visit for hygiene and checkup  Vaccinations: Influenza vaccine: completed 08/16/2021 Pneumococcal vaccine: completed 11/25/2018 Tdap vaccine: due Shingles vaccine: discussed   Covid-19:  06/03/2020, 05/12/2020  Advanced directives:  Advance directive discussed with you today. Even though you declined this today please call our office should you change your mind and we can give you the proper paperwork for you to fill out.  Conditions/risks identified: none  Next appointment: Follow up in one year for your annual wellness visit   Preventive Care 40-64 Years, Male Preventive care refers to lifestyle choices and visits with your health care provider that can promote health and wellness. What does preventive care include? A yearly physical exam. This is also called an annual well check. Dental exams once or twice a year. Routine eye exams. Ask your health care provider how often you should have your eyes checked. Personal lifestyle choices, including: Daily care of your teeth and gums. Regular physical activity. Eating a healthy diet. Avoiding tobacco and drug use. Limiting alcohol use. Practicing safe sex. Taking low-dose aspirin every day starting at age 33. What happens during an annual well check? The services and screenings done by your health care provider during your annual well check will depend on your age, overall health, lifestyle risk factors, and family history of disease. Counseling  Your health care provider may ask you questions about your: Alcohol  use. Tobacco use. Drug use. Emotional well-being. Home and relationship well-being. Sexual activity. Eating habits. Work and work Statistician. Screening  You may have the following tests or measurements: Height, weight, and BMI. Blood pressure. Lipid and cholesterol levels. These may be checked every 5 years, or more frequently if you are over 32 years old. Skin check. Lung cancer screening. You may have this screening every year starting at age 25 if you have a 30-pack-year history of smoking and currently smoke or have quit within the past 15 years. Fecal occult blood test (FOBT) of the stool. You may have this test every year starting at age 36. Flexible sigmoidoscopy or colonoscopy. You may have a sigmoidoscopy every 5 years or a colonoscopy every 10 years starting at age 33. Prostate cancer screening. Recommendations will vary depending on your family history and other risks. Hepatitis C blood test. Hepatitis B blood test. Sexually transmitted disease (STD) testing. Diabetes screening. This is done by checking your blood sugar (glucose) after you have not eaten for a while (fasting). You may have this done every 1-3 years. Discuss your test results, treatment options, and if necessary, the need for more tests with your health care provider. Vaccines  Your health care provider may recommend certain vaccines, such as: Influenza vaccine. This is recommended every year. Tetanus, diphtheria, and acellular pertussis (Tdap, Td) vaccine. You may need a Td booster every 10 years. Zoster vaccine. You may need this after age 26. Pneumococcal 13-valent conjugate (PCV13) vaccine. You may need this if you have certain conditions and have not been vaccinated. Pneumococcal polysaccharide (PPSV23) vaccine. You may need one or two doses if you smoke cigarettes or if you have certain conditions. Talk to your health care provider about which screenings and  vaccines you need and how often you need  them. This information is not intended to replace advice given to you by your health care provider. Make sure you discuss any questions you have with your health care provider. Document Released: 11/17/2015 Document Revised: 07/10/2016 Document Reviewed: 08/22/2015 Elsevier Interactive Patient Education  2017 [employer] Prevention in the Home Falls can cause injuries. They can happen to people of all ages. There are many things you can do to make your home safe and to help prevent falls. What can I do on the outside of my home? Regularly fix the edges of walkways and driveways and fix any cracks. Remove anything that might make you trip as you walk through a door, such as a raised step or threshold. Trim any bushes or trees on the path to your home. Use bright outdoor lighting. Clear any walking paths of anything that might make someone trip, such as rocks or tools. Regularly check to see if handrails are loose or broken. Make sure that both sides of any steps have handrails. Any raised decks and porches should have guardrails on the edges. Have any leaves, snow, or ice cleared regularly. Use sand or salt on walking paths during winter. Clean up any spills in your garage right away. This includes oil or grease spills. What can I do in the bathroom? Use night lights. Install grab bars by the toilet and in the tub and shower. Do not use towel bars as grab bars. Use non-skid mats or decals in the tub or shower. If you need to sit down in the shower, use a plastic, non-slip stool. Keep the floor dry. Clean up any water that spills on the floor as soon as it happens. Remove soap buildup in the tub or shower regularly. Attach bath mats securely with double-sided non-slip rug tape. Do not have throw rugs and other things on the floor that can make you trip. What can I do in the bedroom? Use night lights. Make sure that you have a light by your bed that is easy to reach. Do not use  any sheets or blankets that are too big for your bed. They should not hang down onto the floor. Have a firm chair that has side arms. You can use this for support while you get dressed. Do not have throw rugs and other things on the floor that can make you trip. What can I do in the kitchen? Clean up any spills right away. Avoid walking on wet floors. Keep items that you use a lot in easy-to-reach places. If you need to reach something above you, use a strong step stool that has a grab bar. Keep electrical cords out of the way. Do not use floor polish or wax that makes floors slippery. If you must use wax, use non-skid floor wax. Do not have throw rugs and other things on the floor that can make you trip. What can I do with my stairs? Do not leave any items on the stairs. Make sure that there are handrails on both sides of the stairs and use them. Fix handrails that are broken or loose. Make sure that handrails are as long as the stairways. Check any carpeting to make sure that it is firmly attached to the stairs. Fix any carpet that is loose or worn. Avoid having throw rugs at the top or bottom of the stairs. If you do have throw rugs, attach them to the floor with carpet tape. Make  sure that you have a light switch at the top of the stairs and the bottom of the stairs. If you do not have them, ask someone to add them for you. What else can I do to help prevent falls? Wear shoes that: Do not have high heels. Have rubber bottoms. Are comfortable and fit you well. Are closed at the toe. Do not wear sandals. If you use a stepladder: Make sure that it is fully opened. Do not climb a closed stepladder. Make sure that both sides of the stepladder are locked into place. Ask someone to hold it for you, if possible. Clearly mark and make sure that you can see: Any grab bars or handrails. First and last steps. Where the edge of each step is. Use tools that help you move around (mobility aids)  if they are needed. These include: Canes. Walkers. Scooters. Crutches. Turn on the lights when you go into a dark area. Replace any light bulbs as soon as they burn out. Set up your furniture so you have a clear path. Avoid moving your furniture around. If any of your floors are uneven, fix them. If there are any pets around you, be aware of where they are. Review your medicines with your doctor. Some medicines can make you feel dizzy. This can increase your chance of falling. Ask your doctor what other things that you can do to help prevent falls. This information is not intended to replace advice given to you by your health care provider. Make sure you discuss any questions you have with your health care provider. Document Released: 08/17/2009 Document Revised: 03/28/2016 Document Reviewed: 11/25/2014 Elsevier Interactive Patient Education  2017 [employer].

## 2021-11-20 NOTE — Progress Notes (Signed)
This visit occurred during the SARS-CoV-2 public health emergency.  Safety protocols were in place, including screening questions prior to the visit, additional usage of staff PPE, and extensive cleaning of exam room while observing appropriate contact time as indicated for disinfecting solutions.  Subjective:   Bruce Robertson is a 61 y.o. male who presents for Medicare Annual/Subsequent preventive examination.  Review of Systems     Cardiac Risk Factors include: advanced age (>77mn, >>57women);dyslipidemia;hypertension;male gender     Objective:    Today's Vitals   11/20/21 0917  BP: (!) 90/59  Pulse: (!) 59  Temp: 98 F (36.7 C)  TempSrc: Oral  SpO2: 97%  Weight: 215 lb 6.4 oz (97.7 kg)  Height: 6' (1.829 m)   Body mass index is 29.21 kg/m.  Advanced Directives 11/20/2021 10/08/2021 01/21/2021 11/14/2020 09/21/2020 08/15/2020 05/23/2020  Does Patient Have a Medical Advance Directive? No No No No No No No  Does patient want to make changes to medical advance directive? - - - - - - -  Would patient like information on creating a medical advance directive? No - Patient declined No - Patient declined - Yes (MAU/Ambulatory/Procedural Areas - Information given) No - Patient declined - No - Patient declined    Current Medications (verified) Outpatient Encounter Medications as of 11/20/2021  Medication Sig   ACCU-CHEK GUIDE test strip Use as directed-patient needs an appt   Accu-Chek Softclix Lancets lancets Use as directed   albuterol (VENTOLIN HFA) 108 (90 Base) MCG/ACT inhaler Inhale 2 puffs into the lungs every 6 (six) hours as needed for wheezing or shortness of breath.   blood glucose meter kit and supplies KIT Dispense based on patient and insurance preference. Use up to four times daily as directed.   Blood Pressure Monitoring (BLOOD PRESSURE CUFF) MISC 1 Device by Does not apply route daily as needed.   carvedilol (COREG) 12.5 MG tablet TAKE 1 TABLET BY MOUTH TWICE DAILY .  APPOINTMENT REQUIRED FOR FUTURE REFILLS   ELIQUIS 5 MG TABS tablet Take 1 tablet by mouth twice daily   ENTRESTO 24-26 MG TAKE 1 TABLET BY MOUTH IN THE MORNING AND AT BEDTIME   JARDIANCE 10 MG TABS tablet Take 1 tablet by mouth once daily   Multiple Vitamin (MULTIVITAMIN WITH MINERALS) TABS tablet Take 1 tablet by mouth daily.   sildenafil (VIAGRA) 50 MG tablet TAKE 1 TABLET BY MOUTH ONCE DAILY AS NEEDED FOR ERECTILE DYSFUNCTION   simvastatin (ZOCOR) 40 MG tablet TAKE 1 TABLET BY MOUTH AT BEDTIME   Spacer/Aero-Holding Chambers (AEROCHAMBER PLUS) inhaler Use as instructed   spironolactone (ALDACTONE) 25 MG tablet Take 25 mg by mouth daily.    torsemide (DEMADEX) 20 MG tablet Take 2 tablets (40 mg total) by mouth daily.   cyclobenzaprine (FLEXERIL) 10 MG tablet Take 1 tablet (10 mg total) by mouth 2 (two) times daily as needed for muscle spasms. (Patient not taking: Reported on 11/20/2021)   No facility-administered encounter medications on file as of 11/20/2021.    Allergies (verified) Patient has no known allergies.   History: Past Medical History:  Diagnosis Date   AICD (automatic cardioverter/defibrillator) present    Anxiety 08/20/2016   Cardiomyopathy    Nonischemic. EF has been about 45%. S/P CABG.;  b.  Echo 4/14: EF 25%, global HK with inf and mid apical AK, restrictive physiology with E/e' > 15 (elevated LV filling pressure), trivial AI/MR, mild to mod LAE, mild RVE, mild reduced RVSF, mild RAE, mod TR, PASP 74 (severe  pulmonary HTN)    CHF (congestive heart failure) (HCC)    CHF exacerbation ([place]) 07/31/2016   Colon polyps 2002   Coronary artery disease    Diabetes mellitus without complication (HCC)    Type II   Dyslipidemia    hx (03/20/2017)   GERD (gastroesophageal reflux disease)    History of bleeding peptic ulcer    History of echocardiogram    Echo 10/16:  EF 25-30%, poss non-compaction, diff HK with inf-lat and apical HK, restrictive physio, mild AI, severe LAE, mild  RVE with mild reduced RVSF, PASP 42 mmHg   Hyperplastic rectal polyp    Moderate episode of recurrent major depressive disorder ([place]) 08/20/2016   09/14/20 - not current   OSA on CPAP    Mild   Pneumonia 09/2018   Tobacco abuse    Remote   Past Surgical History:  Procedure Laterality Date   CARDIAC CATHETERIZATION  2007;  2009   CARDIAC DEFIBRILLATOR PLACEMENT  03/20/2017   COLONOSCOPY WITH PROPOFOL N/A 01/21/2019   Procedure: COLONOSCOPY WITH PROPOFOL;  Surgeon: Milus Banister, MD;  Location: [facility];  Service: Endoscopy;  Laterality: N/A;   CORONARY ARTERY BYPASS GRAFT  2007   Had a left main dissection after catheterization. Underwent a saphenous vein graft to the LAD and a saphenous vein graft to obtuse marginal.   HEMORRHOID SURGERY N/A 08/23/2020   Procedure: HEMORRHOIDECTOMY WITH LIGATION AND HEMORRHOIDOPEXY;  Surgeon: Michael Boston, MD;  Location: [facility];  Service: General;  Laterality: N/A;   ICD IMPLANT N/A 03/20/2017   Procedure: ICD Implant;  Surgeon: Constance Haw, MD;  Location: [facility];  Service: Cardiovascular;  Laterality: N/A;   PATELLAR TENDON REPAIR Right 2005   PATELLAR TENDON REPAIR Right 09/21/2020   Procedure: PATELLA TENDON REPAIR;  Surgeon: Leandrew Koyanagi, MD;  Location: [place];  Service: Orthopedics;  Laterality: Right;   POLYPECTOMY  01/21/2019   Procedure: POLYPECTOMY;  Surgeon: Milus Banister, MD;  Location: [facility];  Service: Endoscopy;;   RECTAL EXAM UNDER ANESTHESIA Left 08/23/2020   Procedure: RECTAL EXAM UNDER ANESTHESIA;  Surgeon: Michael Boston, MD;  Location: [facility];  Service: General;  Laterality: Left;   Family History  Problem Relation Age of Onset   Hypertension Mother    Osteoarthritis Mother    Heart failure Father 37   High blood pressure Father    Lung cancer Other    Cancer Maternal Grandmother        uncertain type   Bipolar disorder Daughter    Diabetes Daughter    Cancer Maternal Uncle         uncertain type   Diabetes Niece    Asthma Neg Hx    Social History   Socioeconomic History   Marital status: Divorced    Spouse name: Not on file   Number of children: 5   Years of education: Not on file   Highest education level: Not on file  Occupational History   Occupation: Civil engineer, contracting    Employer: [employer]  Tobacco Use   Smoking status: Former    Packs/day: 0.25    Years: 7.00    Pack years: 1.75    Types: Cigarettes   Smokeless tobacco: Never   Tobacco comments:    03/20/2017 "quit in 1997  Vaping Use   Vaping Use: Former   Devices: vaped for about a month  Substance and Sexual Activity   Alcohol use: Not Currently    Alcohol/week:  0.0 standard drinks    Comment: twice a month no preference   Drug use: Yes    Frequency: 1.0 times per week    Types: Marijuana    Comment: last time 09/15/20   Sexual activity: Yes  Other Topics Concern   Not on file  Social History Narrative   Single   Social Determinants of Health   Financial Resource Strain: Low Risk    Difficulty of Paying Living Expenses: Not hard at all  Food Insecurity: No Food Insecurity   Worried About Charity fundraiser in the Last Year: Never true   [place] in the Last Year: Never true  Transportation Needs: No Transportation Needs   Lack of Transportation (Medical): No   Lack of Transportation (Non-Medical): No  Physical Activity: Inactive   Days of Exercise per Week: 0 days   Minutes of Exercise per Session: 0 min  Stress: No Stress Concern Present   Feeling of Stress : Only a little  Social Connections: Not on file    Tobacco Counseling Counseling given: Not Answered Tobacco comments: 03/20/2017 "quit in 1997   Clinical Intake:  Pre-visit preparation completed: Yes  Pain : No/denies pain     Nutritional Status: BMI 25 -29 Overweight Nutritional Risks: Nausea/ vomitting/ diarrhea (a little nausea every now and then) Diabetes: No  How often do you need to have  someone help you when you read instructions, pamphlets, or other written materials from your doctor or pharmacy?: 1 - Never What is the last grade level you completed in school?: 12th grade  Diabetic? no  Interpreter Needed?: No  Information entered by :: NAllen LPN   Activities of Daily Living In your present state of health, do you have any difficulty performing the following activities: 11/20/2021  Hearing? N  Vision? N  Difficulty concentrating or making decisions? N  Walking or climbing stairs? Y  Dressing or bathing? N  Doing errands, shopping? N  Preparing Food and eating ? N  Using the Toilet? N  In the past six months, have you accidently leaked urine? Y  Do you have problems with loss of bowel control? N  Managing your Medications? N  Managing your Finances? N  Housekeeping or managing your Housekeeping? N  Some recent data might be hidden    Patient Care Team: Caren Macadam, MD as PCP - General (Family Medicine) Constance Haw, MD as PCP - Electrophysiology (Cardiology) Minus Breeding, MD as PCP - [facility] Access (Cardiology) Minus Breeding, MD as PCP - Cardiology (Cardiology) Constance Haw, MD as Consulting Physician (Cardiology) Eli Hose, [facility] (Inactive) as Pharmacist (Pharmacist) Milus Banister, MD as Attending Physician (Gastroenterology) Michael Boston, MD as Consulting Physician (General Surgery) Riccardo Dubin, MD as Referring Physician (Cardiology)  Indicate any recent Medical Services you may have received from other than [facility] providers in the past year (date may be approximate).     Assessment:   This is a routine wellness examination for Bruce Robertson.  Hearing/Vision screen Vision Screening - Comments:: Regular eye exams, [facility]  Dietary issues and exercise activities discussed: Current Exercise Habits: The patient does not participate in regular exercise at present   Goals Addressed             This  Visit's Progress    Patient Stated       11/20/2021, no goals       Depression Screen PHQ 2/9 Scores 11/20/2021 12/01/2020 11/14/2020  PHQ - 2 Score  0 2 1  PHQ- 9 Score - 8 -    Fall Risk Fall Risk  11/20/2021 11/14/2020  Falls in the past year? 0 0  Number falls in past yr: - 0  Injury with Fall? - 0  Risk for fall due to : Medication side effect Impaired vision;Impaired balance/gait;Impaired mobility  Risk for fall due to: Comment - related to kness and vertigo  Follow up Falls evaluation completed;Education provided;Falls prevention discussed -    FALL RISK PREVENTION PERTAINING TO THE HOME:  Any stairs in or around the home? Yes  If so, are there any without handrails? No  Home free of loose throw rugs in walkways, pet beds, electrical cords, etc? Yes  Adequate lighting in your home to reduce risk of falls? Yes   ASSISTIVE DEVICES UTILIZED TO PREVENT FALLS:  Life alert? No  Use of a cane, walker or w/c? No  Grab bars in the bathroom? No  Shower chair or bench in shower? No  Elevated toilet seat or a handicapped toilet? No   TIMED UP AND GO:  Was the test performed? No .    Gait steady and fast without use of assistive device  Cognitive Function:     6CIT Screen 11/20/2021 11/14/2020  What Year? 0 points 0 points  What month? 0 points 0 points  What time? 0 points -  Count back from 20 0 points 0 points  Months in reverse 4 points 4 points  Repeat phrase 0 points 0 points  Total Score 4 -    Immunizations Immunization History  Administered Date(s) Administered   Influenza,inj,Quad PF,6+ Mos 11/25/2018, 08/16/2021   Influenza-Unspecified 08/23/2019   PFIZER(Purple Top)SARS-COV-2 Vaccination 05/12/2020, 06/03/2020   Pneumococcal Conjugate-13 11/25/2018    TDAP status: Due, Education has been provided regarding the importance of this vaccine. Advised may receive this vaccine at local pharmacy or Health Dept. Aware to provide a copy of the vaccination record  if obtained from local pharmacy or Health Dept. Verbalized acceptance and understanding.  Flu Vaccine status: Up to date  Pneumococcal vaccine status: Up to date  Covid-19 vaccine status: Completed vaccines  Qualifies for Shingles Vaccine? Yes   Zostavax completed No   Shingrix Completed?: No.    Education has been provided regarding the importance of this vaccine. Patient has been advised to call insurance company to determine out of pocket expense if they have not yet received this vaccine. Advised may also receive vaccine at local pharmacy or Health Dept. Verbalized acceptance and understanding.  Screening Tests Health Maintenance  Topic Date Due   Zoster Vaccines- Shingrix (1 of 2) Never done   COVID-19 Vaccine (3 - Pfizer risk series) 07/01/2020   Pneumococcal Vaccine 74-7 Years old (2 - PPSV23 if available, else PCV20) 11/20/2022 (Originally 11/26/2019)   TETANUS/TDAP  11/20/2022 (Originally 01/03/1982)   COLONOSCOPY (Pts 45-68yr Insurance coverage will need to be confirmed)  01/20/2029   INFLUENZA VACCINE  Completed   Hepatitis C Screening  Completed   HIV Screening  Completed   HPV VACCINES  Aged Out    Health Maintenance  Health Maintenance Due  Topic Date Due   Zoster Vaccines- Shingrix (1 of 2) Never done   COVID-19 Vaccine (3 - Pfizer risk series) 07/01/2020    Colorectal cancer screening: Type of screening: Colonoscopy. Completed 01/21/2019. Repeat every 10 years  Lung Cancer Screening: (Low Dose CT Chest recommended if Age 424-80years, 30 pack-year currently smoking OR have quit w/in 15years.) does not qualify.  Lung Cancer Screening Referral: no  Additional Screening:  Hepatitis C Screening: does qualify; Completed 07/31/2016  Vision Screening: Recommended annual ophthalmology exams for early detection of glaucoma and other disorders of the eye. Is the patient up to date with their annual eye exam?  Yes  Who is the provider or what is the name of the office  in which the patient attends annual eye exams? [facility] If pt is not established with a provider, would they like to be referred to a provider to establish care? No .   Dental Screening: Recommended annual dental exams for proper oral hygiene  Community Resource Referral / Chronic Care Management: CRR required this visit?  No   CCM required this visit?  No      Plan:     I have personally reviewed and noted the following in the patients chart:   Medical and social history Use of alcohol, tobacco or illicit drugs  Current medications and supplements including opioid prescriptions. Patient is not currently taking opioid prescriptions. Functional ability and status Nutritional status Physical activity Advanced directives List of other physicians Hospitalizations, surgeries, and ER visits in previous 12 months Vitals Screenings to include cognitive, depression, and falls Referrals and appointments  In addition, I have reviewed and discussed with patient certain preventive protocols, quality metrics, and best practice recommendations. A written personalized care plan for preventive services as well as general preventive health recommendations were provided to patient.     Kellie Simmering, LPN   [id number]   Nurse Notes: none

## 2021-11-22 DIAGNOSIS — I5022 Chronic systolic (congestive) heart failure: Secondary | ICD-10-CM | POA: Diagnosis not present

## 2021-11-25 NOTE — Progress Notes (Addendum)
Cardiology Office Note Date:  11/29/2021  Patient ID:  Bruce Robertson, DOB July 17, 1961, MRN [id number] PCP:  Caren Macadam, MD  Cardiologist:  Dr. Warren Lacy OSA: Dr. Claiborne Billings Electrophysiologist: Dr. Curt Bears Dr. Cristela Blue, HF clinic    Chief Complaint: AF on remote  History of Present Illness: Bruce Robertson is a 61 y.o. male with history of NICM, chronic CHF (systolic), OSA w/CPAP, CAD (CABG in 2007 due to diagnostic cath complicated by LM dissection), AFib, LV thrombus by his echo 2021, DM  Notes report prior follow up with [facility] HF service, ?non compaction Also discussed management over the years difficult with social constraints and inconsistent f/u  Comes in today to  be seen for Dr. Curt Bears, last seen by him Jan 2021, dietary/salt indiscretions discussed, though doing fairly well, some NSVT on his device, coreg increased.  Has been following with cardiology, fatigued/SOB waxing/waning, seemed to correlate with volume status up/down on his device impedance. Had self doubled up on his torsemide Discussed importance of salt restriction, was euvolemic at his visit Saw Dr. [name] 10/05/21, was pending a new sleep study.  Called in 2/2 prolonged AFib episode  TODAY Generally feels tired, no energy He saw Dr. Posey Pronto last week, lowered his coreg dose, orthostatic dizziness is improved some since that He discussed that Dr. Posey Pronto suspected he was nearing time to start thinking about transplant, he is scheduled for further testing in March to better evaluate where he is from a HF/CM perspective  He has not had any clinical changes since seeing Dr. Posey Pronto, outside of perhaps a little better/less orthostatic  He has not had syncope or syncope, he has not has device therapies  No bleeding or signs of bleeding  Device information MDT single chamber ICD implanted 03/20/2017   Past Medical History:  Diagnosis Date   AICD (automatic cardioverter/defibrillator) present    Anxiety  08/20/2016   Cardiomyopathy    Nonischemic. EF has been about 45%. S/P CABG.;  b.  Echo 4/14: EF 25%, global HK with inf and mid apical AK, restrictive physiology with E/e' > 15 (elevated LV filling pressure), trivial AI/MR, mild to mod LAE, mild RVE, mild reduced RVSF, mild RAE, mod TR, PASP 74 (severe pulmonary HTN)    CHF (congestive heart failure) (HCC)    CHF exacerbation (HCC) 07/31/2016   Colon polyps 2002   Coronary artery disease    Diabetes mellitus without complication (HCC)    Type II   Dyslipidemia    hx (03/20/2017)   GERD (gastroesophageal reflux disease)    History of bleeding peptic ulcer    History of echocardiogram    Echo 10/16:  EF 25-30%, poss non-compaction, diff HK with inf-lat and apical HK, restrictive physio, mild AI, severe LAE, mild RVE with mild reduced RVSF, PASP 42 mmHg   Hyperplastic rectal polyp    Moderate episode of recurrent major depressive disorder ([place]) 08/20/2016   09/14/20 - not current   OSA on CPAP    Mild   Pneumonia 09/2018   Tobacco abuse    Remote    Past Surgical History:  Procedure Laterality Date   CARDIAC CATHETERIZATION  2007;  2009   CARDIAC DEFIBRILLATOR PLACEMENT  03/20/2017   COLONOSCOPY WITH PROPOFOL N/A 01/21/2019   Procedure: COLONOSCOPY WITH PROPOFOL;  Surgeon: Milus Banister, MD;  Location: [facility];  Service: Endoscopy;  Laterality: N/A;   CORONARY ARTERY BYPASS GRAFT  2007   Had a left main dissection after catheterization. Underwent a saphenous vein graft  to the LAD and a saphenous vein graft to obtuse marginal.   HEMORRHOID SURGERY N/A 08/23/2020   Procedure: HEMORRHOIDECTOMY WITH LIGATION AND HEMORRHOIDOPEXY;  Surgeon: Michael Boston, MD;  Location: [facility];  Service: General;  Laterality: N/A;   ICD IMPLANT N/A 03/20/2017   Procedure: ICD Implant;  Surgeon: Constance Haw, MD;  Location: [facility];  Service: Cardiovascular;  Laterality: N/A;   PATELLAR TENDON REPAIR Right 2005   PATELLAR TENDON  REPAIR Right 09/21/2020   Procedure: PATELLA TENDON REPAIR;  Surgeon: Leandrew Koyanagi, MD;  Location: [facility];  Service: Orthopedics;  Laterality: Right;   POLYPECTOMY  01/21/2019   Procedure: POLYPECTOMY;  Surgeon: Milus Banister, MD;  Location: [facility];  Service: Endoscopy;;   RECTAL EXAM UNDER ANESTHESIA Left 08/23/2020   Procedure: RECTAL EXAM UNDER ANESTHESIA;  Surgeon: Michael Boston, MD;  Location: [facility];  Service: General;  Laterality: Left;    Current Outpatient Medications  Medication Sig Dispense Refill   ACCU-CHEK GUIDE test strip Use as directed-patient needs an appt 100 each 1   Accu-Chek Softclix Lancets lancets Use as directed 100 each 1   albuterol (VENTOLIN HFA) 108 (90 Base) MCG/ACT inhaler Inhale 2 puffs into the lungs every 6 (six) hours as needed for wheezing or shortness of breath. 8 g 0   blood glucose meter kit and supplies KIT Dispense based on patient and insurance preference. Use up to four times daily as directed. 1 each 0   Blood Pressure Monitoring (BLOOD PRESSURE CUFF) MISC 1 Device by Does not apply route daily as needed. 1 each 0   carvedilol (COREG) 6.25 MG tablet Take 6.25 mg by mouth 2 (two) times daily with a meal.     cyclobenzaprine (FLEXERIL) 10 MG tablet Take 1 tablet (10 mg total) by mouth 2 (two) times daily as needed for muscle spasms. 30 tablet 0   ELIQUIS 5 MG TABS tablet Take 1 tablet by mouth twice daily 180 tablet 3   ENTRESTO 24-26 MG TAKE 1 TABLET BY MOUTH IN THE MORNING AND AT BEDTIME 180 tablet 1   JARDIANCE 10 MG TABS tablet Take 1 tablet by mouth once daily 90 tablet 1   Multiple Vitamin (MULTIVITAMIN WITH MINERALS) TABS tablet Take 1 tablet by mouth daily.     omeprazole (PRILOSEC) 40 MG capsule Take 40 mg by mouth daily.     sildenafil (VIAGRA) 50 MG tablet TAKE 1 TABLET BY MOUTH ONCE DAILY AS NEEDED FOR ERECTILE DYSFUNCTION 10 tablet 0   simvastatin (ZOCOR) 40 MG tablet TAKE 1 TABLET BY MOUTH AT BEDTIME 90 tablet 0    Spacer/Aero-Holding Chambers (AEROCHAMBER PLUS) inhaler Use as instructed 1 each 0   spironolactone (ALDACTONE) 25 MG tablet Take 25 mg by mouth daily.      torsemide (DEMADEX) 20 MG tablet Take 2 tablets (40 mg total) by mouth daily. 180 tablet 3   No current facility-administered medications for this visit.    Allergies:   Patient has no known allergies.   Social History:  The patient  reports that he has quit smoking. His smoking use included cigarettes. He has a 1.75 pack-year smoking history. He has never used smokeless tobacco. He reports that he does not currently use alcohol. He reports current drug use. Frequency: 1.00 time per week. Drug: Marijuana.   Family History:  The patient's family history includes Bipolar disorder in his daughter; Cancer in his maternal grandmother and maternal uncle; Diabetes in his daughter and niece; Heart  failure (age of onset: 53) in his father; High blood pressure in his father; Hypertension in his mother; Lung cancer in an other family member; Osteoarthritis in his mother.  ROS:  Please see the history of present illness.    All other systems are reviewed and otherwise negative.   PHYSICAL EXAM:  VS:  BP 92/60    Pulse 71    Ht 6' (1.829 m)    Wt 209 lb (94.8 kg)    SpO2 97%    BMI 28.35 kg/m  BMI: Body mass index is 28.35 kg/m. Well nourished, well developed, in no acute distress HEENT: normocephalic, atraumatic Neck: no JVD, carotid bruits or masses Cardiac:  RRR; some extrasystoles, no significant murmurs, no rubs, or gallops Lungs:  CTA b/l, no wheezing, rhonchi or rales Abd: soft, nontender MS: no deformity or atrophy Ext: no edema Skin: warm and dry, no rash Neuro:  No gross deficits appreciated Psych: euthymic mood, full affect  ICD site is stable, no tethering or discomfort   EKG:  not done today  Device interrogation done today and reviewed by myself:  Battery and lead measurements are good Since April 2022 AF burden 4.7% By  histograms a slowly increasing burden Jan 20th had a 3 day episode His NSVT episodes noted are within his AF episode and appear most likely by morphology to be AF, not true NSVT Rates in AF do not appear to be persistently elevated by histograms, but does have some RVR as well  10/05/2020: TTE  SEVERE LV DYSFUNCTION (See above)  (20%)   ELEVATED LA PRESSURES WITH DIASTOLIC DYSFUNCTION    MODERATE RV SYSTOLIC DYSFUNCTION (See above)    VALVULAR REGURGITATION: MILD AR, MILD MR, TRIVIAL PR, MODERATE TR    NO VALVULAR STENOSIS    SEVERELY REDUCED LV FUNCTION WITH AKINETIC SEGMENTS.    RIGHT HEART PHYSIOLOGY SUGGESTIVE OF PULMONARY HYPERTENSION.    NO PRIOR STUDY FOR COMPARISON    03/21/2020: CPX Conclusion: Exercise testing with gas exchange demonstrates mild functional impairment when compared to matched sedentary norms. Patient appears with primary mild HF limitation with elevated VE/VCO2 slope, predicting worsening short-term prognosis. There is a mild restrictive pulmonary component contributing to symptoms, and this appears related to patient's body habitus. There was chronotropic incompetence. PVO2 slightly improved from 2017, however, VE/VCO2 slope significantly worsened.    Test, report and preliminary impression by:  Landis Martins, MS, ACSM-RCEP  03/21/2020 1:12 PM    Agree with above. There is  Mild functional limitation that is clearly multifactorial related to HF, obesity with related restrictive lung physiology and mild chronotropic incompetence. The markedly elevated VE/VCO2 slope suggests a significant HF component.    02/04/2020: TTE IMPRESSIONS   1. Left ventricular ejection fraction, by estimation, is 15-20%. The left  ventricle has severely decreased function. The left ventricle demonstrates  global hypokinesis. The left ventricular internal cavity size was severely  dilated. Left ventricular  diastolic parameters are indeterminate. Elevated left ventricular   end-diastolic pressure.   2. Right ventricular systolic function is moderately reduced. The right  ventricular size is moderately enlarged. There is severely elevated  pulmonary artery systolic pressure. The estimated right ventricular  systolic pressure is 01.6 mmHg.   3. Left atrial size was moderately dilated.   4. Right atrial size was moderate-severely dilated.   5. The mitral valve is degenerative. Mild mitral valve regurgitation. No  evidence of mitral stenosis.   6. Tricuspid valve regurgitation is severe.   7. The aortic valve  is abnormal. Aortic valve regurgitation is mild. No  aortic stenosis is present.   8. The inferior vena cava is dilated in size with <50% respiratory  variability, suggesting right atrial pressure of 15 mmHg.   Comparison(s): A prior study was performed on 04/01/2018. Prior images  reviewed side by side. Changes from prior study are noted. LVEF has  decreased. TR has increased.   Conclusion(s)/Recommendation(s): No LV apical thrombus with Definity  contrast.   Recent Labs: 01/26/2021: Brain Natriuretic Peptide 663; Brain Natriuretic Peptide CANCELED 05/30/2021: ALT 31; Hemoglobin 12.6; Platelets 159.0; Pro B Natriuretic peptide (BNP) 519.0; TSH 1.31 10/05/2021: BUN 18; Creatinine, Ser 1.67; Potassium 3.9; Sodium 139  12/01/2020: Cholesterol 142; HDL 44.90; LDL Cholesterol 79; Total CHOL/HDL Ratio 3; Triglycerides 88.0; VLDL 17.6   CrCl cannot be calculated (Patient's most recent lab result is older than the maximum 21 days allowed.).   Wt Readings from Last 3 Encounters:  11/29/21 209 lb (94.8 kg)  11/20/21 215 lb 6.4 oz (97.7 kg)  10/08/21 205 lb (93 kg)     Other studies reviewed: Additional studies/records reviewed today include: summarized above  ASSESSMENT AND PLAN:  ICD Intact function  Paroxysmal Afib LV thrombus by echo 2021 CHA2DS2Vasc is 5, on Eliquis, appropriately dosed 4.7% AF burden  Chronic CHF NICM Bi-Ve  failure  CAD No anginal symptoms   Coreg just down-titrated by HF MD, suspecting he is near advanced therapies AF burden increasing, but paroxysmal every couple months looks like' Rates can get fast in AF I think we may need to consider AAD, given his severe CM, I think amiodarone, despspite his young age likely to be most effective, maintaining SR will be important for him/his clinic status Tikosyn perhaps, though with some CKD and likely advancing CM to end stage, not sure this is the right drug I will send note to Dr. Warren Lacy and Dr. Curt Bears for their input/thoughts  ADDEND: 11/30/21 Dr. Macky Lower thoughts are: "Dofetilide would be reasonable, though in this clinical situation with him moving towards alternative heart failure therapies, amiodarone may be the best option for him.  Also with his burden of 4.7%, we could continue to just watch"  I will ask my MA at this time we will monitor his AF burden via his device and consider adding amiodarone if it increases, for now, no changes. Tommye Standard, PA_C    Disposition: F/u with remotes as usual, Dr. Curt Bears in 74mo Current medicines are reviewed at length with the patient today.  The patient did not have any concerns regarding medicines.  SVenetia Night PA-C 11/29/2021 10:07 AM     [place] [phone](office)  [phone](fax)

## 2021-11-27 NOTE — Progress Notes (Signed)
Remote ICD transmission.   

## 2021-11-29 ENCOUNTER — Ambulatory Visit (INDEPENDENT_AMBULATORY_CARE_PROVIDER_SITE_OTHER): Payer: Medicare HMO | Admitting: Physician Assistant

## 2021-11-29 ENCOUNTER — Other Ambulatory Visit: Payer: Self-pay

## 2021-11-29 ENCOUNTER — Encounter: Payer: Self-pay | Admitting: Physician Assistant

## 2021-11-29 VITALS — BP 92/60 | HR 71 | Ht 72.0 in | Wt 209.0 lb

## 2021-11-29 DIAGNOSIS — I480 Paroxysmal atrial fibrillation: Secondary | ICD-10-CM

## 2021-11-29 DIAGNOSIS — I428 Other cardiomyopathies: Secondary | ICD-10-CM | POA: Diagnosis not present

## 2021-11-29 DIAGNOSIS — I420 Dilated cardiomyopathy: Secondary | ICD-10-CM | POA: Diagnosis not present

## 2021-11-29 DIAGNOSIS — I5022 Chronic systolic (congestive) heart failure: Secondary | ICD-10-CM | POA: Diagnosis not present

## 2021-11-29 DIAGNOSIS — Z95810 Presence of automatic (implantable) cardiac defibrillator: Secondary | ICD-10-CM

## 2021-11-29 LAB — CUP PACEART INCLINIC DEVICE CHECK
Battery Remaining Longevity: 82 mo
Battery Voltage: 2.97 V
Brady Statistic RV Percent Paced: 0.57 %
Date Time Interrogation Session: 20230126174052
HighPow Impedance: 61 Ohm
Implantable Lead Implant Date: 20180517
Implantable Lead Location: 753860
Implantable Pulse Generator Implant Date: 20180517
Lead Channel Impedance Value: 361 Ohm
Lead Channel Impedance Value: 418 Ohm
Lead Channel Pacing Threshold Amplitude: 0.75 V
Lead Channel Pacing Threshold Pulse Width: 0.4 ms
Lead Channel Sensing Intrinsic Amplitude: 12 mV
Lead Channel Sensing Intrinsic Amplitude: 13.75 mV
Lead Channel Setting Pacing Amplitude: 2.5 V
Lead Channel Setting Pacing Pulse Width: 0.4 ms
Lead Channel Setting Sensing Sensitivity: 0.3 mV

## 2021-11-29 NOTE — Patient Instructions (Signed)
Medication Instructions:   Your physician recommends that you continue on your current medications as directed. Please refer to the Current Medication list given to you today.   *If you need a refill on your cardiac medications before your next appointment, please call your pharmacy*   Lab Work: [place]   If you have labs (blood work) drawn today and your tests are completely normal, you will receive your results only by: [place] (if you have MyChart) OR A paper copy in the mail If you have any lab test that is abnormal or we need to change your treatment, we will call you to review the results.   Testing/Procedures: NONE ORDERED  TODAY     Follow-Up: At [facility], you and your health needs are our priority.  As part of our continuing mission to provide you with exceptional heart care, we have created designated Provider Care Teams.  These Care Teams include your primary Cardiologist (physician) and Advanced Practice Providers (APPs -  Physician Assistants and Nurse Practitioners) who all work together to provide you with the care you need, when you need it.  We recommend signing up for the patient portal called "MyChart".  Sign up information is provided on this After Visit Summary.  MyChart is used to connect with patients for Virtual Visits (Telemedicine).  Patients are able to view lab/test results, encounter notes, upcoming appointments, etc.  Non-urgent messages can be sent to your provider as well.   To learn more about what you can do with MyChart, go to [email or web address].    Your next appointment:   3 month(s)  ( CONTACT [facility] FOR EP SCHEDULING ISSUES )   The format for your next appointment:   In Person  Provider:   You may see Will [name], MD   Other Instructions

## 2021-11-30 ENCOUNTER — Encounter: Payer: Self-pay | Admitting: Family Medicine

## 2021-11-30 ENCOUNTER — Ambulatory Visit (INDEPENDENT_AMBULATORY_CARE_PROVIDER_SITE_OTHER): Payer: Medicare HMO | Admitting: Family Medicine

## 2021-11-30 ENCOUNTER — Telehealth: Payer: Self-pay | Admitting: Family Medicine

## 2021-11-30 VITALS — BP 80/48 | HR 77 | Temp 98.0°F | Ht 72.0 in | Wt 207.1 lb

## 2021-11-30 DIAGNOSIS — I5022 Chronic systolic (congestive) heart failure: Secondary | ICD-10-CM

## 2021-11-30 DIAGNOSIS — R634 Abnormal weight loss: Secondary | ICD-10-CM

## 2021-11-30 DIAGNOSIS — R059 Cough, unspecified: Secondary | ICD-10-CM | POA: Diagnosis not present

## 2021-11-30 DIAGNOSIS — I952 Hypotension due to drugs: Secondary | ICD-10-CM | POA: Diagnosis not present

## 2021-11-30 DIAGNOSIS — E1165 Type 2 diabetes mellitus with hyperglycemia: Secondary | ICD-10-CM | POA: Diagnosis not present

## 2021-11-30 DIAGNOSIS — Z13220 Encounter for screening for lipoid disorders: Secondary | ICD-10-CM

## 2021-11-30 DIAGNOSIS — R0602 Shortness of breath: Secondary | ICD-10-CM

## 2021-11-30 LAB — COMPREHENSIVE METABOLIC PANEL
ALT: 20 U/L (ref 0–53)
AST: 23 U/L (ref 0–37)
Albumin: 4.3 g/dL (ref 3.5–5.2)
Alkaline Phosphatase: 57 U/L (ref 39–117)
BUN: 25 mg/dL — ABNORMAL HIGH (ref 6–23)
CO2: 33 mEq/L — ABNORMAL HIGH (ref 19–32)
Calcium: 9.8 mg/dL (ref 8.4–10.5)
Chloride: 96 mEq/L (ref 96–112)
Creatinine, Ser: 1.78 mg/dL — ABNORMAL HIGH (ref 0.40–1.50)
GFR: 41.42 mL/min — ABNORMAL LOW (ref >60.00)
Glucose, Bld: 88 mg/dL (ref 70–99)
Potassium: 3.4 mEq/L — ABNORMAL LOW (ref 3.5–5.1)
Sodium: 137 mEq/L (ref 135–145)
Total Bilirubin: 1.3 mg/dL — ABNORMAL HIGH (ref 0.2–1.2)
Total Protein: 7.5 g/dL (ref 6.0–8.3)

## 2021-11-30 LAB — TSH: TSH: 1.59 u[IU]/mL (ref 0.35–5.50)

## 2021-11-30 LAB — MICROALBUMIN / CREATININE URINE RATIO
Creatinine,U: 53.5 mg/dL
Microalb Creat Ratio: 1.3 mg/g (ref 0.0–30.0)
Microalb, Ur: <0.7 mg/dL (ref 0.0–1.9)

## 2021-11-30 LAB — LIPID PANEL
Cholesterol: 141 mg/dL (ref 0–200)
HDL: 48.9 mg/dL
LDL Cholesterol: 76 mg/dL (ref 0–99)
NonHDL: 92.21
Total CHOL/HDL Ratio: 3
Triglycerides: 82 mg/dL (ref 0.0–149.0)
VLDL: 16.4 mg/dL (ref 0.0–40.0)

## 2021-11-30 LAB — T3, FREE: T3, Free: 3.7 pg/mL (ref 2.3–4.2)

## 2021-11-30 LAB — T4, FREE: Free T4: 1.29 ng/dL (ref 0.60–1.60)

## 2021-11-30 LAB — BRAIN NATRIURETIC PEPTIDE: Pro B Natriuretic peptide (BNP): 943 pg/mL — ABNORMAL HIGH (ref 0.0–100.0)

## 2021-11-30 MED ORDER — ALBUTEROL SULFATE HFA 108 (90 BASE) MCG/ACT IN AERS: 2.0000 | INHALATION_SPRAY | Freq: Four times a day (QID) | RESPIRATORY_TRACT | 0 refills | Status: DC | PRN

## 2021-11-30 NOTE — Telephone Encounter (Signed)
Disability Parking Placard to be filled out--placed in dr's folder.  Upon completion, mail to: [address] [place], [place] [address]

## 2021-11-30 NOTE — Progress Notes (Signed)
Bruce Robertson DOB: October 11, 1961 Encounter date: 11/30/2021  This is a 61 y.o. male who presents with Chief Complaint  Patient presents with   Shortness of Breath    X2 months    History of present illness: Does feel like shortness of breath is getting a little better. Doesn't haveto be doing anything, but breath is just gone. If walking across parking lot, that will be problem sometimes. Sometimes feels like it is getting better, and other times just not there.   Trying to gain weight; and losing quickly. Started to note loss in last few weeks. He quit eating beef about 3 weeks ago (was doing 2-3 steaks/week). Has been exercising regularly - just started back at gym. Doing more water. At gym doing 15-20 min treadmill, then weights- lighter weights, more reps. Treadmill easily loses breath. Weights he can  breath ok because he gets breaks. Feels better in generally after exercise.    Does try to wear his cpap machine - some nights it is difficult to wear and makes him feel like he can't get good breaths in. Feels beter when he uses this.   He is getting echo, stress test on 9th and this is in anticipation for cardiac transplant.   Does check blood sugars at home, but not recently.   Breath is short at work; albuterol does help when he uses this. Using it 3-4 times/day. Does help every time.   No Known Allergies Current Meds  Medication Sig   ACCU-CHEK GUIDE test strip Use as directed-patient needs an appt   Accu-Chek Softclix Lancets lancets Use as directed   albuterol (VENTOLIN HFA) 108 (90 Base) MCG/ACT inhaler Inhale 2 puffs into the lungs every 6 (six) hours as needed for wheezing or shortness of breath.   blood glucose meter kit and supplies KIT Dispense based on patient and insurance preference. Use up to four times daily as directed.   Blood Pressure Monitoring (BLOOD PRESSURE CUFF) MISC 1 Device by Does not apply route daily as needed.   carvedilol (COREG) 6.25 MG tablet Take  6.25 mg by mouth 2 (two) times daily with a meal.   ELIQUIS 5 MG TABS tablet Take 1 tablet by mouth twice daily   ENTRESTO 24-26 MG TAKE 1 TABLET BY MOUTH IN THE MORNING AND AT BEDTIME   JARDIANCE 10 MG TABS tablet Take 1 tablet by mouth once daily   Multiple Vitamin (MULTIVITAMIN WITH MINERALS) TABS tablet Take 1 tablet by mouth daily.   omeprazole (PRILOSEC) 40 MG capsule Take 40 mg by mouth daily.   sildenafil (VIAGRA) 50 MG tablet TAKE 1 TABLET BY MOUTH ONCE DAILY AS NEEDED FOR ERECTILE DYSFUNCTION   simvastatin (ZOCOR) 40 MG tablet TAKE 1 TABLET BY MOUTH AT BEDTIME   Spacer/Aero-Holding Chambers (AEROCHAMBER PLUS) inhaler Use as instructed   spironolactone (ALDACTONE) 25 MG tablet Take 25 mg by mouth daily.    torsemide (DEMADEX) 20 MG tablet Take 2 tablets (40 mg total) by mouth daily.    Review of Systems  Constitutional:  Negative for chills, fatigue (energy level feels better since starting exercise) and fever.  Respiratory:  Positive for cough (intermittent) and shortness of breath. Negative for chest tightness and wheezing.   Cardiovascular:  Negative for chest pain, palpitations and leg swelling.   Objective:  BP (!) 80/48 (BP Location: Left Arm, Patient Position: Sitting, Cuff Size: Large)    Pulse 77    Temp 98 F (36.7 C) (Oral)    Ht 6' (1.829 m)  Wt 207 lb 1.6 oz (93.9 kg)    SpO2 95%    BMI 28.09 kg/m   Weight: 207 lb 1.6 oz (93.9 kg)   BP Readings from Last 3 Encounters:  11/30/21 (!) 80/48  11/29/21 92/60  11/20/21 (!) 90/59   Wt Readings from Last 3 Encounters:  11/30/21 207 lb 1.6 oz (93.9 kg)  11/29/21 209 lb (94.8 kg)  11/20/21 215 lb 6.4 oz (97.7 kg)    Physical Exam Constitutional:      General: He is not in acute distress.    Appearance: He is well-developed.  Cardiovascular:     Rate and Rhythm: Normal rate and regular rhythm.     Heart sounds: Normal heart sounds. No murmur heard.   No friction rub.  Pulmonary:     Effort: Pulmonary effort is  normal. No respiratory distress.     Breath sounds: Normal breath sounds. No wheezing or rales.  Musculoskeletal:     Right lower leg: No edema.     Left lower leg: No edema.  Neurological:     Mental Status: He is alert and oriented to person, place, and time.  Psychiatric:        Behavior: Behavior normal.    Assessment/Plan  1. Shortness of breath Patient states that the albuterol inhaler he was given when he was previously having a more regular and productive cough has been helpful for him.  He has been using it 4 times a day.  I think it is worth ruling out a secondary pulmonary issue that is contributing to his shortness of breath, although I suspect the main etiology is his heart.  He was a former smoker.  I did refill his albuterol for him today.  I also gave him a Breztri inhaler to sample to see how he does with this for the next week until we get blood work back. - Ambulatory referral to Pulmonology  2. Hypotension due to drugs Cardiology is working closely with him, but I will alert them to current blood pressure and lab work once results are back.  I suspect some of lowering blood pressure is also related to his weight loss.  In an effort to be healthier, he has cut out red meat, which she was indulging in on a very regular basis.  We discussed that healthier eating and lower sodium eating is likely contributing to his lower blood pressures. - Comprehensive metabolic panel; Future - Comprehensive metabolic panel  3. Chronic systolic CHF (congestive heart failure) ([place]) Following regularly with cardiology.  He is under consideration for heart transplant.  He is wanting to play an active role in maintaining his health but has some difficulties with knowing what to eat due to blood sugar, heart health.  He would like to meet with a dietitian after discussion of options. - Brain natriuretic peptide; Future - Amb Referral to Nutrition and Diabetic Education - Brain natriuretic  peptide  4. Weight loss Suspect related to starting exercise, healthier eating, as well as chronic condition of his heart. - TSH; Future - T4, free; Future - T3, free; Future - Amb Referral to Nutrition and Diabetic Education - T3, free - T4, free - TSH  5. Controlled type 2 diabetes mellitus with hyperglycemia, without long-term current use of insulin ([place]) Recheck lab work today.  Currently patient is on Jardiance 10 mg daily.  He has been well controlled on this. - Hemoglobin A1c; Future - Microalbumin / creatinine urine ratio; Future - Amb Referral  to Nutrition and Diabetic Education - Microalbumin / creatinine urine ratio - Hemoglobin A1c  6. Lipid screening - Lipid panel; Future - Lipid panel  7. Cough/suspect mild obstructive component to breathing as well. Since he has seen pulmonology in the past; hoping he can be seen relatively soon. We could get PFTs if appointment will take more than a couple of weeks.  - albuterol (VENTOLIN HFA) 108 (90 Base) MCG/ACT inhaler; Inhale 2 puffs into the lungs every 6 (six) hours as needed for wheezing or shortness of breath.  Dispense: 8 g; Refill: 0   Return for pending lab or imaging results.   50 minutes spent in chart review, looking at cardiology through our hospital system as well as [facility].  I reviewed lab work recently done by cardiologist through [facility].  We discussed weight loss as it relates to his blood pressure.  We discussed healthy eating.  Exam, follow-up treatment plan, charting.   Micheline Rough, MD

## 2021-11-30 NOTE — Telephone Encounter (Signed)
Form returned to your desk

## 2021-12-03 LAB — HEMOGLOBIN A1C: Hgb A1c MFr Bld: 7 % — ABNORMAL HIGH (ref 4.6–6.5)

## 2021-12-03 NOTE — Telephone Encounter (Signed)
Spoke with the patient and informed him the disability parking handicapped placard for 5 years will be mailed to the home address as below.  Patient also wanted to let Dr Ethlyn Gallery know the inhaler worked great for him.  Message sent to PCP.

## 2021-12-04 MED ORDER — BREZTRI AEROSPHERE 160-9-4.8 MCG/ACT IN AERO: 2.0000 | INHALATION_SPRAY | Freq: Two times a day (BID) | RESPIRATORY_TRACT | 0 refills | Status: DC

## 2021-12-04 NOTE — Addendum Note (Signed)
Addended by: Lahoma Crocker A on: 12/04/2021 09:00 AM   Modules accepted: Orders
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
## 2021-12-07 ENCOUNTER — Ambulatory Visit: Payer: Medicare HMO | Admitting: Neurology

## 2021-12-10 ENCOUNTER — Other Ambulatory Visit: Payer: Self-pay | Admitting: Family Medicine

## 2021-12-10 DIAGNOSIS — R7989 Other specified abnormal findings of blood chemistry: Secondary | ICD-10-CM

## 2021-12-10 DIAGNOSIS — I5022 Chronic systolic (congestive) heart failure: Secondary | ICD-10-CM

## 2021-12-10 DIAGNOSIS — E876 Hypokalemia: Secondary | ICD-10-CM

## 2021-12-10 DIAGNOSIS — R17 Unspecified jaundice: Secondary | ICD-10-CM

## 2021-12-17 ENCOUNTER — Other Ambulatory Visit: Payer: Self-pay | Admitting: Family Medicine

## 2021-12-17 ENCOUNTER — Other Ambulatory Visit: Payer: Self-pay | Admitting: Cardiology

## 2021-12-26 ENCOUNTER — Ambulatory Visit (INDEPENDENT_AMBULATORY_CARE_PROVIDER_SITE_OTHER): Payer: Medicare HMO | Admitting: Internal Medicine

## 2021-12-26 ENCOUNTER — Other Ambulatory Visit: Payer: Self-pay

## 2021-12-26 ENCOUNTER — Encounter: Payer: Self-pay | Admitting: Internal Medicine

## 2021-12-26 VITALS — BP 82/60 | HR 67 | Temp 98.2°F | Ht 72.0 in | Wt 217.2 lb

## 2021-12-26 DIAGNOSIS — G4733 Obstructive sleep apnea (adult) (pediatric): Secondary | ICD-10-CM | POA: Diagnosis not present

## 2021-12-26 DIAGNOSIS — K219 Gastro-esophageal reflux disease without esophagitis: Secondary | ICD-10-CM

## 2021-12-26 DIAGNOSIS — I5020 Unspecified systolic (congestive) heart failure: Secondary | ICD-10-CM | POA: Diagnosis not present

## 2021-12-26 NOTE — Progress Notes (Signed)
Bruce Robertson    [id number]    07/18/1961  Primary Care Physician:Koberlein, Steele Berg, MD Date of Appointment: 12/26/2021 Established Patient Visit  Chief complaint:   Chief Complaint  Patient presents with   Follow-up    No concerns.     HPI: Bruce Robertson is a 61 y.o. man with history of heart failure EF 15-20% and OSA on BIPAP as well as GERD.   Interval Updates: Here for follow up after starting BIPAP and having had a PAP titration study. New settings 25/20. Download reviewed. Almost 100% adherence in the last month but did not use over a stretch of 6 weeks though the holidays. While using it he has avg pressure 22.5/12.5 and significant AHI 30-40 range with central apneas.   No hospitalizations or ED visits.   Feels better when he wears the BIPAP and feels very sluggish when he doesn't wear it.   Exercising regularly - up to 13 minutes walking briskly on incline on the treadmill.   Has lost a little weight and reports appetite is down.   I have reviewed the patient's family social and past medical history and updated as appropriate.   Past Medical History:  Diagnosis Date   AICD (automatic cardioverter/defibrillator) present    Anxiety 08/20/2016   Cardiomyopathy    Nonischemic. EF has been about 45%. S/P CABG.;  b.  Echo 4/14: EF 25%, global HK with inf and mid apical AK, restrictive physiology with E/e' > 15 (elevated LV filling pressure), trivial AI/MR, mild to mod LAE, mild RVE, mild reduced RVSF, mild RAE, mod TR, PASP 74 (severe pulmonary HTN)    CHF (congestive heart failure) (HCC)    CHF exacerbation (HCC) 07/31/2016   Colon polyps 2002   Coronary artery disease    Diabetes mellitus without complication (HCC)    Type II   Dyslipidemia    hx (03/20/2017)   GERD (gastroesophageal reflux disease)    History of bleeding peptic ulcer    History of echocardiogram    Echo 10/16:  EF 25-30%, poss non-compaction, diff HK with inf-lat and apical HK,  restrictive physio, mild AI, severe LAE, mild RVE with mild reduced RVSF, PASP 42 mmHg   Hyperplastic rectal polyp    Moderate episode of recurrent major depressive disorder ([place]) 08/20/2016   09/14/20 - not current   OSA on CPAP    Mild   Pneumonia 09/2018   Tobacco abuse    Remote    Past Surgical History:  Procedure Laterality Date   CARDIAC CATHETERIZATION  2007;  2009   CARDIAC DEFIBRILLATOR PLACEMENT  03/20/2017   COLONOSCOPY WITH PROPOFOL N/A 01/21/2019   Procedure: COLONOSCOPY WITH PROPOFOL;  Surgeon: Milus Banister, MD;  Location: [facility];  Service: Endoscopy;  Laterality: N/A;   CORONARY ARTERY BYPASS GRAFT  2007   Had a left main dissection after catheterization. Underwent a saphenous vein graft to the LAD and a saphenous vein graft to obtuse marginal.   HEMORRHOID SURGERY N/A 08/23/2020   Procedure: HEMORRHOIDECTOMY WITH LIGATION AND HEMORRHOIDOPEXY;  Surgeon: Michael Boston, MD;  Location: [facility];  Service: General;  Laterality: N/A;   ICD IMPLANT N/A 03/20/2017   Procedure: ICD Implant;  Surgeon: Constance Haw, MD;  Location: [facility];  Service: Cardiovascular;  Laterality: N/A;   PATELLAR TENDON REPAIR Right 2005   PATELLAR TENDON REPAIR Right 09/21/2020   Procedure: PATELLA TENDON REPAIR;  Surgeon: Leandrew Koyanagi, MD;  Location:  [facility];  Service: Orthopedics;  Laterality: Right;   POLYPECTOMY  01/21/2019   Procedure: POLYPECTOMY;  Surgeon: Milus Banister, MD;  Location: [facility];  Service: Endoscopy;;   RECTAL EXAM UNDER ANESTHESIA Left 08/23/2020   Procedure: RECTAL EXAM UNDER ANESTHESIA;  Surgeon: Michael Boston, MD;  Location: [facility];  Service: General;  Laterality: Left;    Family History  Problem Relation Age of Onset   Hypertension Mother    Osteoarthritis Mother    Heart failure Father 34   High blood pressure Father    Lung cancer Other    Cancer Maternal Grandmother        uncertain type   Bipolar disorder Daughter    Diabetes  Daughter    Cancer Maternal Uncle        uncertain type   Diabetes Niece    Asthma Neg Hx     Social History   Occupational History   Occupation: Civil engineer, contracting    Employer: [employer]  Tobacco Use   Smoking status: Former    Packs/day: 0.25    Years: 7.00    Pack years: 1.75    Types: Cigarettes   Smokeless tobacco: Never   Tobacco comments:    03/20/2017 "quit in [place] Use   Vaping Use: Former   Devices: vaped for about a month  Substance and Sexual Activity   Alcohol use: Not Currently    Alcohol/week: 0.0 standard drinks    Comment: twice a month no preference   Drug use: Yes    Frequency: 1.0 times per week    Types: Marijuana    Comment: last time 09/15/20   Sexual activity: Yes     Physical Exam: Blood pressure (!) 84/60, pulse 67, temperature 98.2 F (36.8 C), temperature source Oral, height 6' (1.829 m), weight 217 lb 3.2 oz (98.5 kg), SpO2 100 %.  Gen:      No acute distress ENT:  no nasal polyps, mucus membranes moist Lungs:    ctab no wheezes or crackles CV:       RRR no edema   Data Reviewed: Imaging: I have personally reviewed the CT Chest 07/2021 which shows stable pulmonary nodule.   PFTs:  PFT Results Latest Ref Rng & Units 03/15/2020  FVC-Pre L 3.01  FVC-Predicted Pre % 66  FVC-Post L 3.07  FVC-Predicted Post % 67  Pre FEV1/FVC % % 83  Post FEV1/FCV % % 83  FEV1-Pre L 2.48  FEV1-Predicted Pre % 69  FEV1-Post L 2.55  DLCO uncorrected ml/min/mmHg 24.67  DLCO UNC% % 80  DLCO corrected ml/min/mmHg 29.20  DLCO COR %Predicted % 94  DLVA Predicted % 122  TLC L 5.84  TLC % Predicted % 76  RV % Predicted % 112   I have personally reviewed the patient's PFTs and mild restriction to ventilation secondary to body habitus  Labs: Lab Results  Component Value Date   WBC 10.4 05/30/2021   HGB 12.6 (L) 05/30/2021   HCT 40.0 05/30/2021   MCV 73.8 (L) 05/30/2021   PLT 159.0 05/30/2021   Lab Results  Component Value Date   NA  137 11/30/2021   K 3.4 (L) 11/30/2021   CL 96 11/30/2021   CO2 33 (H) 11/30/2021    Echo 2019 shows EF 15-20% Immunization status: Immunization History  Administered Date(s) Administered   Influenza,inj,Quad PF,6+ Mos 11/25/2018, 08/16/2021   Influenza-Unspecified 08/23/2019   PFIZER Comirnaty(Gray Top)Covid-19 Tri-Sucrose Vaccine 05/12/2020, 06/03/2020   PFIZER(Purple Top)SARS-COV-2 Vaccination  05/12/2020, 06/03/2020   Pneumococcal Conjugate-13 11/25/2018    Assessment:  Dyspnea on exertion multifactorial from heart failure HFrEF, OSA OSA on BIPAP GERD   Plan/Recommendations: Continue PPI for GERD  He has significantly untreated central and obstructive sleep apneas, likely exacerbated by heart failure. Given nature of complex sleep disorder I am having him seen by one of my sleep colleagues for ongoing evaluation  He is followed at [facility] and is undergoing transplant evaluation.    Return to Care: Follow up with sleep.   Lenice Llamas, MD Pulmonary and [place]

## 2021-12-26 NOTE — Patient Instructions (Signed)
Keep wearing your Bipap.   I am having you follow up with the sleep doctors in our practice. Our office will schedule this for you.   Keep up the good work with exercise and eating well.  I wish you luck with your transplant evaluation!

## 2022-01-02 ENCOUNTER — Other Ambulatory Visit: Payer: Medicare HMO

## 2022-01-03 ENCOUNTER — Other Ambulatory Visit (INDEPENDENT_AMBULATORY_CARE_PROVIDER_SITE_OTHER): Payer: Medicare HMO

## 2022-01-03 DIAGNOSIS — E876 Hypokalemia: Secondary | ICD-10-CM

## 2022-01-03 DIAGNOSIS — R17 Unspecified jaundice: Secondary | ICD-10-CM

## 2022-01-03 LAB — COMPREHENSIVE METABOLIC PANEL
ALT: 20 U/L (ref 0–53)
AST: 23 U/L (ref 0–37)
Albumin: 4 g/dL (ref 3.5–5.2)
Alkaline Phosphatase: 57 U/L (ref 39–117)
BUN: 22 mg/dL (ref 6–23)
CO2: 32 mEq/L (ref 19–32)
Calcium: 9.5 mg/dL (ref 8.4–10.5)
Chloride: 96 mEq/L (ref 96–112)
Creatinine, Ser: 1.6 mg/dL — ABNORMAL HIGH (ref 0.40–1.50)
GFR: 47.04 mL/min — ABNORMAL LOW (ref >60.00)
Glucose, Bld: 99 mg/dL (ref 70–99)
Potassium: 3.7 mEq/L (ref 3.5–5.1)
Sodium: 136 mEq/L (ref 135–145)
Total Bilirubin: 0.7 mg/dL (ref 0.2–1.2)
Total Protein: 6.8 g/dL (ref 6.0–8.3)

## 2022-01-04 ENCOUNTER — Ambulatory Visit (INDEPENDENT_AMBULATORY_CARE_PROVIDER_SITE_OTHER): Payer: Medicare HMO | Admitting: Family Medicine

## 2022-01-04 ENCOUNTER — Encounter: Payer: Self-pay | Admitting: Family Medicine

## 2022-01-04 VITALS — BP 78/50 | HR 60 | Temp 97.9°F | Ht 72.0 in | Wt 214.4 lb

## 2022-01-04 DIAGNOSIS — G4733 Obstructive sleep apnea (adult) (pediatric): Secondary | ICD-10-CM

## 2022-01-04 DIAGNOSIS — N6315 Unspecified lump in the right breast, overlapping quadrants: Secondary | ICD-10-CM | POA: Diagnosis not present

## 2022-01-04 DIAGNOSIS — I420 Dilated cardiomyopathy: Secondary | ICD-10-CM

## 2022-01-04 DIAGNOSIS — I10 Essential (primary) hypertension: Secondary | ICD-10-CM

## 2022-01-04 DIAGNOSIS — J449 Chronic obstructive pulmonary disease, unspecified: Secondary | ICD-10-CM | POA: Diagnosis not present

## 2022-01-04 MED ORDER — BREZTRI AEROSPHERE 160-9-4.8 MCG/ACT IN AERO
2.0000 | INHALATION_SPRAY | Freq: Two times a day (BID) | RESPIRATORY_TRACT | 0 refills | Status: DC
Start: 2022-01-04 — End: 2022-01-29

## 2022-01-04 NOTE — Progress Notes (Addendum)
?Blayden Conwell ?DOB: 1961/03/30 ?Encounter date: 01/04/2022 ? ?This is a 61 y.o. male who presents with ?Chief Complaint  ?Patient presents with  ? Cyst  ?  Patient complains of a painful knot along the right chest x2 weeks, no known injury  ? ? ?History of present illness: ?*Had visit with pulmonology last month.  Dr. Shearon Stalls is referring him to a colleague for ongoing evaluation due to his significant central and obstructive sleep apnea as well as comorbidities.  Last visit with me was 11/30/2021.  At that time we discussed shortness of breath, weight loss (he had started exercising more and had cut out red meat), he was getting some improvement with albuterol.  Blood pressure running low related to heart failure as well as weight loss. ? ?There is little sore spot, knot under right nipple. Has been there for 2 months, but started getting sore a couple of weeks ago. No known cause, just felt it, and then when bumped into something it hurt. Not getting bigger. No drainage, no pus. Thinks that other knot on right side is getting bigger.  ? ?Has followup with cardiology on 01/10/22.  ? ?He has been eating better, attributes weight gain to that. Still mostly doing chicken, fish, salad. Cut back on beef. Overall feels good. Sometimes sluggish in morning.  ? ?Judithann Sauger seemed to help him a lot. Really doesn't do as well without it. Really helps through day and limits and keeps him from getting short of breath in the afternoon. Not using albuterol at all after starting breztri. Using every other day. Can go up and back down stairs before getting short of breath. Can go through store without stopping.  ? ?Cut back on carvedilol which helped with breathing. He also stopped smoking which has helped.  ? ?No Known Allergies ?Current Meds  ?Medication Sig  ? ACCU-CHEK GUIDE test strip Use as directed-patient needs an appt  ? Accu-Chek Softclix Lancets lancets Use as directed  ? albuterol (VENTOLIN HFA) 108 (90 Base) MCG/ACT inhaler  Inhale 2 puffs into the lungs every 6 (six) hours as needed for wheezing or shortness of breath.  ? blood glucose meter kit and supplies KIT Dispense based on patient and insurance preference. Use up to four times daily as directed.  ? Blood Pressure Monitoring (BLOOD PRESSURE CUFF) MISC 1 Device by Does not apply route daily as needed.  ? Budeson-Glycopyrrol-Formoterol (BREZTRI AEROSPHERE) 160-9-4.8 MCG/ACT AERO Inhale 2 puffs into the lungs 2 (two) times daily.  ? Budeson-Glycopyrrol-Formoterol (BREZTRI AEROSPHERE) 160-9-4.8 MCG/ACT AERO Inhale 2 puffs into the lungs 2 (two) times daily.  ? carvedilol (COREG) 6.25 MG tablet Take 6.25 mg by mouth 2 (two) times daily with a meal.  ? ELIQUIS 5 MG TABS tablet Take 1 tablet by mouth twice daily  ? ENTRESTO 24-26 MG TAKE 1 TABLET BY MOUTH IN THE MORNING AND AT BEDTIME  ? JARDIANCE 10 MG TABS tablet Take 1 tablet by mouth once daily  ? Multiple Vitamin (MULTIVITAMIN WITH MINERALS) TABS tablet Take 1 tablet by mouth daily.  ? omeprazole (PRILOSEC) 40 MG capsule Take 40 mg by mouth daily.  ? sildenafil (VIAGRA) 50 MG tablet TAKE 1 TABLET BY MOUTH ONCE DAILY AS NEEDED FOR ERECTILE DYSFUNCTION  ? simvastatin (ZOCOR) 40 MG tablet TAKE 1 TABLET BY MOUTH AT BEDTIME  ? Spacer/Aero-Holding Chambers (AEROCHAMBER PLUS) inhaler Use as instructed  ? spironolactone (ALDACTONE) 25 MG tablet Take 25 mg by mouth daily.   ? torsemide (DEMADEX) 20 MG tablet Take  2 tablets (40 mg total) by mouth daily.  ? ? ?Review of Systems  ?Constitutional:  Negative for chills, fatigue and fever.  ?Respiratory:  Negative for cough, chest tightness, shortness of breath (breathing feels significantly better on breztri) and wheezing.   ?Cardiovascular:  Negative for chest pain, palpitations and leg swelling.  ? ?Objective: ? ?BP (!) 78/50 (BP Location: Right Arm, Patient Position: Sitting, Cuff Size: Large)   Pulse 60   Temp 97.9 ?F (36.6 ?C) (Oral)   Ht 6' (1.829 m)   Wt 214 lb 6.4 oz (97.3 kg)    SpO2 98%   BMI 29.08 kg/m?   Weight: 214 lb 6.4 oz (97.3 kg)  ? ?BP Readings from Last 3 Encounters:  ?01/04/22 (!) 78/50  ?12/26/21 (!) 82/60  ?11/30/21 (!) 80/48  ? ?Wt Readings from Last 3 Encounters:  ?01/04/22 214 lb 6.4 oz (97.3 kg)  ?12/26/21 217 lb 3.2 oz (98.5 kg)  ?11/30/21 207 lb 1.6 oz (93.9 kg)  ? ? ?Physical Exam ?Constitutional:   ?   General: He is not in acute distress. ?   Appearance: He is well-developed.  ?Cardiovascular:  ?   Rate and Rhythm: Normal rate and regular rhythm.  ?   Heart sounds: Murmur heard.  ?Systolic murmur is present with a grade of 1/6.  ?  No friction rub.  ?Pulmonary:  ?   Effort: Pulmonary effort is normal. No respiratory distress.  ?   Breath sounds: Normal breath sounds. No wheezing or rales.  ?Chest:  ?   Comments: There is a 2cm mass, slightly mobile right breast. Tender. More lateral with sitting, somewhat posterior to nipple while lying down.  ?Musculoskeletal:  ?   Right lower leg: No edema.  ?   Left lower leg: No edema.  ?Neurological:  ?   Mental Status: He is alert and oriented to person, place, and time.  ?Psychiatric:     ?   Behavior: Behavior normal.  ? ? ?Assessment/Plan ? ?1. Mass overlapping multiple quadrants of right breast ?- US BREAST COMPLETE UNI RIGHT INC AXILLA; Future ? ?2. Cardiomyopathy, dilated, nonischemic ([place]) ?Following with cardiology regularly. ?- AMB Referral to [place] ? ?3. Essential hypertension ?Well controlled; still on low end. Has follow up with cardiology next week.  ?- AMB Referral to [place] ? ?4. Obstructive sleep apnea syndrome ?Following with pulm; recently referred to another provider for eval.  ?- AMB Referral to [place] ? ?5. Chronic obstructive pulmonary disease, unspecified COPD type ([place]) ?He really gets significant benefit with breztri, even at low doses. PFTs 2021 did not demonstrate significant response to BD, but he has notable difference with exercise  tolerance/breathing with this.  ?- AMB Referral to [place] ? ?Return for pending lab or imaging results. ? ? ? ? ? ? ?Micheline Rough, MD ?

## 2022-01-07 ENCOUNTER — Telehealth: Payer: Self-pay | Admitting: Family Medicine

## 2022-01-07 NOTE — Chronic Care Management (AMB) (Signed)
?  Chronic Care Management  ? ?Note ? ?01/07/2022 ?Name: [name] MRN: [id number] DOB: 1961/04/14 ? ?[name] is a 61 y.o. year old male who is a primary care patient of Koberlein, Steele Berg, MD. I reached out to [name] by phone today in response to a referral sent by Mr. Kathi Ludwig PCP, Caren Macadam, MD.  ? ?Mr. Cafaro was given information about Chronic Care Management services today including:  ?CCM service includes personalized support from designated clinical staff supervised by his physician, including individualized plan of care and coordination with other care providers ?24/7 contact phone numbers for assistance for urgent and routine care needs. ?Service will only be billed when office clinical staff spend 20 minutes or more in a month to coordinate care. ?Only one practitioner may furnish and bill the service in a calendar month. ?The patient may stop CCM services at any time (effective at the end of the month) by phone call to the office staff. ? ? ?Patient agreed to services and verbal consent obtained.  ? ?Follow up plan: PT IS SCHEDULED FO INITIAL OUTREACH 01/29/2022'@11AM'$  OFFICE VISIT ? ? ?Tatjana Dellinger ?Upstream Scheduler  ?

## 2022-01-08 ENCOUNTER — Other Ambulatory Visit: Payer: Self-pay | Admitting: Family Medicine

## 2022-01-08 DIAGNOSIS — N6315 Unspecified lump in the right breast, overlapping quadrants: Secondary | ICD-10-CM

## 2022-01-10 DIAGNOSIS — Z8249 Family history of ischemic heart disease and other diseases of the circulatory system: Secondary | ICD-10-CM | POA: Diagnosis not present

## 2022-01-10 DIAGNOSIS — I088 Other rheumatic multiple valve diseases: Secondary | ICD-10-CM | POA: Diagnosis not present

## 2022-01-10 DIAGNOSIS — I4891 Unspecified atrial fibrillation: Secondary | ICD-10-CM | POA: Diagnosis not present

## 2022-01-10 DIAGNOSIS — Z951 Presence of aortocoronary bypass graft: Secondary | ICD-10-CM | POA: Diagnosis not present

## 2022-01-10 DIAGNOSIS — I5022 Chronic systolic (congestive) heart failure: Secondary | ICD-10-CM | POA: Diagnosis not present

## 2022-01-10 DIAGNOSIS — N183 Chronic kidney disease, stage 3 unspecified: Secondary | ICD-10-CM | POA: Diagnosis not present

## 2022-01-10 DIAGNOSIS — I428 Other cardiomyopathies: Secondary | ICD-10-CM | POA: Diagnosis not present

## 2022-01-10 DIAGNOSIS — E1122 Type 2 diabetes mellitus with diabetic chronic kidney disease: Secondary | ICD-10-CM | POA: Diagnosis not present

## 2022-01-10 DIAGNOSIS — G4733 Obstructive sleep apnea (adult) (pediatric): Secondary | ICD-10-CM | POA: Diagnosis not present

## 2022-01-14 ENCOUNTER — Ambulatory Visit: Payer: Medicare HMO | Admitting: Registered"

## 2022-01-21 ENCOUNTER — Telehealth: Payer: Self-pay | Admitting: Pharmacist

## 2022-01-21 NOTE — Chronic Care Management (AMB) (Signed)
? ? ?Chronic Care Management ?Pharmacy Assistant  ? ?Name: Bruce Robertson  MRN: [id number] DOB: [birth year] ? ?Bruce Robertson is an 61 y.o. year old male who presents for his initial CCM visit with the clinical pharmacist. ? ?Reason for Encounter: Chart prep for initial visit with Jeni Salles Clinical pharmacist on 01/29/2022 at 11:00 in the office.  ?  ?Conditions to be addressed/monitored: ?HTN, HLD, CKD Stage 3, Depression, and GERD ? ? ?Recent office visits:  ?01/04/2022 Micheline Rough MD - Patient was seen for Mass overlapping multiple quadrants of right breast and additional issues. No medication changes. No follow up noted.  ? ?11/30/2021 Micheline Rough MD - Patient was seen for shortness of breath and additional issues. Started Breztri 160/9/4.8 mcg/ACT twice daily. Discontinued Flexeril. No follow up noted.  ? ?11/20/2021 Glenna Durand LPN - Medicare annual wellness exam. ? ?Recent consult visits:  ?01/10/2022 Lattie Haw [facility] Cardiology) - Patient was seen for Chronic systolic CHF. No medications changes. Follow up in 6 months.  ? ?12/26/2021 Lenice Llamas MD (pulmonary) - Patient was seen for OSA treated with BiPAP and additional issues. No medication changes. Follow up in 2 weeks.  ? ?11/29/2021 Tommye Standard PA-C (cardiology) - Patient was seen for Paroxysmal atrial fibrillation and additional issues.  Decreased Carvedilol to 6.25 mg twice daily. Follow up in 3 months.  ? ?11/22/2021 Lattie Haw [facility]) - Patient was seen for Chronic systolic CHF. No medications changes. Follow up in 6 months. ? ?11/15/2021 Will Herald (cardiology) - Patient was seen for Dilated cardiomyopathy. No other notes.  ? ?10/10/2021 Kara Mead (pulmonary) - Patient was seen for Obstructive sleep apnea. No other notes.  ? ?10/05/2021 Minus Breeding MD (cardiology) - Patient was seen for Cardiomyopathy, dilated, nonischemic and additional issues. Increased Torsemide 40 mg daily. Discontinued dexamethasone,  dicyclomine, norco, omeprazole and ondansetron. Follow up in  6 months.  ? ?08/16/2021 Delorise Shiner MD (pulmonary) - Patient was seen for Gastroesophageal reflux disease without esophagitis and additional issues. Follow up in 3 months.  ? ? ?Hospital visits:  ?None ? ?Medications: ?Outpatient Encounter Medications as of 01/21/2022  ?Medication Sig  ? ACCU-CHEK GUIDE test strip Use as directed-patient needs an appt  ? Accu-Chek Softclix Lancets lancets Use as directed  ? albuterol (VENTOLIN HFA) 108 (90 Base) MCG/ACT inhaler Inhale 2 puffs into the lungs every 6 (six) hours as needed for wheezing or shortness of breath.  ? blood glucose meter kit and supplies KIT Dispense based on patient and insurance preference. Use up to four times daily as directed.  ? Blood Pressure Monitoring (BLOOD PRESSURE CUFF) MISC 1 Device by Does not apply route daily as needed.  ? Budeson-Glycopyrrol-Formoterol (BREZTRI AEROSPHERE) 160-9-4.8 MCG/ACT AERO Inhale 2 puffs into the lungs 2 (two) times daily.  ? Budeson-Glycopyrrol-Formoterol (BREZTRI AEROSPHERE) 160-9-4.8 MCG/ACT AERO Inhale 2 puffs into the lungs 2 (two) times daily.  ? carvedilol (COREG) 6.25 MG tablet Take 6.25 mg by mouth 2 (two) times daily with a meal.  ? ELIQUIS 5 MG TABS tablet Take 1 tablet by mouth twice daily  ? ENTRESTO 24-26 MG TAKE 1 TABLET BY MOUTH IN THE MORNING AND AT BEDTIME  ? JARDIANCE 10 MG TABS tablet Take 1 tablet by mouth once daily  ? Multiple Vitamin (MULTIVITAMIN WITH MINERALS) TABS tablet Take 1 tablet by mouth daily.  ? omeprazole (PRILOSEC) 40 MG capsule Take 40 mg by mouth daily.  ? sildenafil (VIAGRA) 50 MG tablet TAKE 1 TABLET BY MOUTH ONCE DAILY AS NEEDED  FOR ERECTILE DYSFUNCTION  ? simvastatin (ZOCOR) 40 MG tablet TAKE 1 TABLET BY MOUTH AT BEDTIME  ? Spacer/Aero-Holding Chambers (AEROCHAMBER PLUS) inhaler Use as instructed  ? spironolactone (ALDACTONE) 25 MG tablet Take 25 mg by mouth daily.   ? torsemide (DEMADEX) 20 MG tablet Take 2  tablets (40 mg total) by mouth daily.  ? ?No facility-administered encounter medications on file as of 01/21/2022.  ?Fill History:  ?  ?ALBUTEROL HFA 90MCG (18GM) AER 11/30/2021 25  ? ?CARVEDILOL 12.5MG   TAB 12/12/2021 90  ? ?JARDIANCE 10MG TAB 11/19/2021 90  ? ?OMEPRAZOLE DR 40MG  CAP 11/19/2021 90  ? ?ENTRESTO 24-26MG    TAB 12/28/2021 90  ? ?SILDENAFIL 50MG TAB 11/09/2021 10  ? ?SIMVASTATIN 40MG    TAB 12/18/2021 90  ? ?SPIRONOLACTONE 25MG    TAB 12/14/2021 90  ? ?TORSEMIDE 20MG      TAB 12/28/2021 90  ? ?Have you seen any other providers since your last visit? ** ? ?Any changes in your medications or health?  ? ?Any side effects from any medications?  ? ?Do you have an symptoms or problems not managed by your medications?  ? ?Any concerns about your health right now?  ? ?Has your provider asked that you check blood pressure, blood sugar, or follow special diet at home?  ? ?Do you get any type of exercise on a regular basis?  ? ?Can you think of a goal you would like to reach for your health?  ? ?Do you have any problems getting your medications?  ? ?Is there anything that you would like to discuss during the appointment?  ? ?Please bring medications and supplements to appointment ? ?Unable to reach patient after several attempts.   ? ?Care Gaps: ?AWV - completed 11/20/2021 ?Last BP - 78/50 on 01/04/2022 ?Last A1C - 7.0 on 11/30/2021 ?Shingrix - never done ?Covid booster - overdue ? ?Star Rating Drugs: ?Jardiance 10 mg - last filled 11/19/2021 90 DS at [facility] ?Simvastatin 40 mg  - last filled 12/18/2021 90 DS at [facility] ? ?Gennie Alma CMA  ?Clinical Pharmacist Assistant ?[phone] ? ?

## 2022-01-22 ENCOUNTER — Ambulatory Visit (INDEPENDENT_AMBULATORY_CARE_PROVIDER_SITE_OTHER): Payer: Medicare HMO

## 2022-01-22 ENCOUNTER — Encounter: Payer: Self-pay | Admitting: Emergency Medicine

## 2022-01-22 ENCOUNTER — Other Ambulatory Visit: Payer: Self-pay

## 2022-01-22 ENCOUNTER — Ambulatory Visit
Admission: EM | Admit: 2022-01-22 | Discharge: 2022-01-22 | Disposition: A | Discharge status: Home / Self Care | Payer: Medicare HMO | Attending: Internal Medicine | Admitting: Internal Medicine

## 2022-01-22 DIAGNOSIS — R059 Cough, unspecified: Secondary | ICD-10-CM

## 2022-01-22 DIAGNOSIS — J069 Acute upper respiratory infection, unspecified: Secondary | ICD-10-CM | POA: Diagnosis not present

## 2022-01-22 DIAGNOSIS — I517 Cardiomegaly: Secondary | ICD-10-CM | POA: Diagnosis not present

## 2022-01-22 DIAGNOSIS — R053 Chronic cough: Secondary | ICD-10-CM

## 2022-01-22 MED ORDER — BENZONATATE 100 MG PO CAPS: 100.0000 mg | ORAL_CAPSULE | Freq: Three times a day (TID) | ORAL | 0 refills | Status: DC | PRN

## 2022-01-22 MED ORDER — FLUTICASONE PROPIONATE 50 MCG/ACT NA SUSP: 1.0000 | Freq: Every day | NASAL | 0 refills | Status: DC

## 2022-01-22 NOTE — ED Triage Notes (Signed)
Patient c/o productive cough x 2 weeks.  Coughing up "thick white stuff".  Only taken OTC cough drops. ?

## 2022-01-22 NOTE — ED Provider Notes (Signed)
?[facility] ? ? ? ?CSN: [id number] ?Arrival date & time: 01/22/22  1050 ? ? ?  ? ?History   ?Chief Complaint ?Chief Complaint  ?Patient presents with  ? Cough  ? ? ?HPI ?Bruce Robertson is a 61 y.o. male.  ? ?Patient presents with nasal congestion and productive cough that has been present for approximately 2 weeks.  Denies any known sick contacts or fevers.  Denies chest pain, shortness of breath, sore throat, ear pain, nausea, vomiting, diarrhea, abdominal pain.  Patient reports that his cough is productive with white sputum.  Patient has taken over-the-counter cough and cold medications with no improvement in symptoms.  Patient denies history of asthma or COPD and is not a smoker. ? ? ?Cough ? ?Past Medical History:  ?Diagnosis Date  ? AICD (automatic cardioverter/defibrillator) present   ? Anxiety 08/20/2016  ? Cardiomyopathy   ? Nonischemic. EF has been about 45%. S/P CABG.;  b.  Echo 4/14: EF 25%, global HK with inf and mid apical AK, restrictive physiology with E/e' > 15 (elevated LV filling pressure), trivial AI/MR, mild to mod LAE, mild RVE, mild reduced RVSF, mild RAE, mod TR, PASP 74 (severe pulmonary HTN)   ? CHF (congestive heart failure) ([place])   ? CHF exacerbation ([place]) 07/31/2016  ? Colon polyps 2002  ? Coronary artery disease   ? Diabetes mellitus without complication ([place])   ? Type II  ? Dyslipidemia   ? hx (03/20/2017)  ? GERD (gastroesophageal reflux disease)   ? History of bleeding peptic ulcer   ? History of echocardiogram   ? Echo 10/16:  EF 25-30%, poss non-compaction, diff HK with inf-lat and apical HK, restrictive physio, mild AI, severe LAE, mild RVE with mild reduced RVSF, PASP 42 mmHg  ? Hyperplastic rectal polyp   ? Moderate episode of recurrent major depressive disorder ([place]) 08/20/2016  ? 09/14/20 - not current  ? OSA on CPAP   ? Mild  ? Pneumonia 09/2018  ? Tobacco abuse   ? Remote  ? ? ?Patient Active Problem List  ? Diagnosis Date Noted  ? 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 Dizziness 12/31/2020  ? Patellar tendon rupture, right, initial encounter 09/13/2020  ? Restrictive lung disease 06/22/2020  ? BRBPR (bright red blood per rectum) 05/24/2020  ? Chronic systolic CHF (congestive heart failure) ([place]) 05/24/2020  ? Acute blood loss anemia 05/24/2020  ? Paroxysmal atrial fibrillation ([place]) 03/28/2020  ? Secondary hypercoagulable state ([place]) 03/28/2020  ? Educated about COVID-19 virus infection 02/10/2020  ? S/P ICD (internal cardiac defibrillator) procedure 07/18/2019  ? Nontraumatic complete tear of right rotator cuff 06/29/2019  ? Pain in left elbow 06/29/2019  ? Prediabetes 06/10/2018  ? Thrombocytopenia ([place]) 06/10/2018  ? AICD (automatic cardioverter/defibrillator) present 06/10/2018  ? History of peptic ulcer disease 06/10/2018  ? NSVT (nonsustained ventricular tachycardia)   ? Hypotension due to drugs 02/09/2018  ? Difficulty controlling anger 12/31/2016  ? Anxiety 08/20/2016  ? Moderate episode of recurrent major depressive disorder ([place]) 08/20/2016  ? Polyp of colon 04/29/2016  ? Shortness of breath 03/05/2016  ? Essential hypertension 10/31/2015  ? Acute on chronic systolic heart failure ([place]) 08/29/2015  ? Hx of CABG 08/29/2015  ? Stage 3 chronic kidney disease ([place]) 08/29/2015  ? Sleep apnea 06/10/2011  ? Fatigue 06/10/2011  ? Obesity (BMI 30-39.9) 05/02/2010  ? Hemorrhoids 02/08/2010  ? Esophageal reflux 02/08/2010  ? Rectal bleeding 02/08/2010  ? Hyperlipidemia 04/06/2009  ? TOBACCO USER 04/06/2009  ?  Cardiomyopathy, dilated, nonischemic ([place]) 04/06/2009  ? Non-ischemic cardiomyopathy ([place]) 04/06/2009  ? ? ?Past Surgical History:  ?Procedure Laterality Date  ? CARDIAC CATHETERIZATION  2007;  2009  ? CARDIAC DEFIBRILLATOR PLACEMENT  03/20/2017  ? COLONOSCOPY WITH PROPOFOL N/A 01/21/2019  ? Procedure: COLONOSCOPY WITH PROPOFOL;  Surgeon: Milus Banister, MD;  [facility]: [facility];  Service: Endoscopy;  Laterality: N/A;  ? CORONARY ARTERY BYPASS GRAFT  2007  ?  Had a left main dissection after catheterization. Underwent a saphenous vein graft to the LAD and a saphenous vein graft to obtuse marginal.  ? HEMORRHOID SURGERY N/A 08/23/2020  ? Procedure: HEMORRHOIDECTOMY WITH LIGATION AND HEMORRHOIDOPEXY;  Surgeon: Michael Boston, MD;  Location: [facility];  Service: General;  Laterality: N/A;  ? ICD IMPLANT N/A 03/20/2017  ? Procedure: ICD Implant;  Surgeon: Constance Haw, MD;  Location: [facility];  Service: Cardiovascular;  Laterality: N/A;  ? PATELLAR TENDON REPAIR Right 2005  ? PATELLAR TENDON REPAIR Right 09/21/2020  ? Procedure: PATELLA TENDON REPAIR;  Surgeon: Leandrew Koyanagi, MD;  Location: [facility];  Service: Orthopedics;  Laterality: Right;  ? POLYPECTOMY  01/21/2019  ? Procedure: POLYPECTOMY;  Surgeon: Milus Banister, MD;  [facility];  Service: Endoscopy;;  ? RECTAL EXAM UNDER ANESTHESIA Left 08/23/2020  ? Procedure: RECTAL EXAM UNDER ANESTHESIA;  Surgeon: Michael Boston, MD;  Location: [facility];  Service: General;  Laterality: Left;  ? ? ? ? ? ?Home Medications   ? ?Prior to Admission medications   ?Medication Sig Start Date End Date Taking? Authorizing Provider  ?ACCU-CHEK GUIDE test strip Use as directed-patient needs an appt 07/18/21  Yes Koberlein, Andris Flurry C, MD  ?Accu-Chek Softclix Lancets lancets Use as directed 07/18/21  Yes Koberlein, Junell C, MD  ?albuterol (VENTOLIN HFA) 108 (90 Base) MCG/ACT inhaler Inhale 2 puffs into the lungs every 6 (six) hours as needed for wheezing or shortness of breath. 11/30/21  Yes Koberlein, Junell C, MD  ?benzonatate (TESSALON) 100 MG capsule Take 1 capsule (100 mg total) by mouth every 8 (eight) hours as needed for cough. 01/22/22  Yes Teodora Medici, FNP  ?blood glucose meter kit and supplies KIT Dispense based on patient and insurance preference. Use up to four times daily as directed. 02/14/20  Yes Koberlein, Steele Berg, MD  ?Blood Pressure Monitoring (BLOOD PRESSURE CUFF) MISC 1 Device by Does not apply route  daily as needed. 01/26/21  Yes Koberlein, Steele Berg, MD  ?Budeson-Glycopyrrol-Formoterol (BREZTRI AEROSPHERE) 160-9-4.8 MCG/ACT AERO Inhale 2 puffs into the lungs 2 (two) times daily. 12/04/21  Yes Koberlein, Steele Berg, MD  ?Budeson-Glycopyrrol-Formoterol (BREZTRI AEROSPHERE) 160-9-4.8 MCG/ACT AERO Inhale 2 puffs into the lungs 2 (two) times daily. 01/04/22  Yes Koberlein, Steele Berg, MD  ?carvedilol (COREG) 6.25 MG tablet Take 6.25 mg by mouth 2 (two) times daily with a meal.   Yes [provider]  ?ELIQUIS 5 MG TABS tablet Take 1 tablet by mouth twice daily 09/28/21  Yes Koberlein, Junell C, MD  ?ENTRESTO 24-26 MG TAKE 1 TABLET BY MOUTH IN THE MORNING AND AT BEDTIME 11/20/21  Yes Koberlein, Junell C, MD  ?fluticasone (FLONASE) 50 MCG/ACT nasal spray Place 1 spray into both nostrils daily for 3 days. 01/22/22 01/25/22 Yes Teodora Medici, FNP  ?JARDIANCE 10 MG TABS tablet Take 1 tablet by mouth once daily 12/18/21  Yes Koberlein, Junell C, MD  ?Multiple Vitamin (MULTIVITAMIN WITH MINERALS) TABS tablet Take 1 tablet by mouth daily.   Yes [provider]  ?omeprazole (PRILOSEC) 40 MG capsule Take 40 mg by mouth daily. 11/19/21  Yes [provider]  ?sildenafil (VIAGRA) 50 MG tablet TAKE 1 TABLET BY MOUTH ONCE DAILY AS NEEDED FOR ERECTILE DYSFUNCTION 11/09/21  Yes Koberlein, Junell C, MD  ?simvastatin (ZOCOR) 40 MG tablet TAKE 1 TABLET BY MOUTH AT BEDTIME 12/18/21  Yes Minus Breeding, MD  ?Spacer/Aero-Holding Chambers (AEROCHAMBER PLUS) inhaler Use as instructed 05/25/21  Yes Koberlein, Steele Berg, MD  ?spironolactone (ALDACTONE) 25 MG tablet Take 25 mg by mouth daily.  03/30/20  Yes [provider]  ?torsemide (DEMADEX) 20 MG tablet Take 2 tablets (40 mg total) by mouth daily. 10/05/21  Yes Minus Breeding, MD  ? ? ?Family History ?Family History  ?Problem Relation Age of Onset  ? Hypertension Mother   ? Osteoarthritis Mother   ? Heart failure Father 41  ? High blood pressure Father   ? Lung cancer  Other   ? Cancer Maternal Grandmother   ?     uncertain type  ? Bipolar disorder Daughter   ? Diabetes Daughter   ? Cancer Maternal Uncle   ?     uncertain type  ? Diabetes Niece   ? Asthma Neg Hx   ? ? ?Social

## 2022-01-22 NOTE — Discharge Instructions (Signed)
It appears that you have a viral infection and x-ray was negative for any acute process.  You have been prescribed 2 medications to help alleviate symptoms.  Please follow-up if symptoms persist or worsen. ?

## 2022-01-28 ENCOUNTER — Other Ambulatory Visit: Payer: Self-pay

## 2022-01-28 ENCOUNTER — Encounter: Payer: Self-pay | Admitting: Pulmonary Disease

## 2022-01-28 ENCOUNTER — Ambulatory Visit (INDEPENDENT_AMBULATORY_CARE_PROVIDER_SITE_OTHER): Payer: Medicare HMO | Admitting: Pulmonary Disease

## 2022-01-28 VITALS — BP 118/76 | HR 69 | Temp 98.1°F | Ht 72.0 in | Wt 213.0 lb

## 2022-01-28 DIAGNOSIS — G4733 Obstructive sleep apnea (adult) (pediatric): Secondary | ICD-10-CM

## 2022-01-28 MED ORDER — PREDNISONE 10 MG PO TABS: ORAL_TABLET | ORAL | 0 refills | Status: AC

## 2022-01-28 NOTE — Progress Notes (Signed)
? ?Subjective:  ? ? Patient ID: Bruce Robertson, male    DOB: May 23, 1961, 61 y.o.   MRN: [id number] ? ?HPI ? ?61 year old with nonischemic cardiomyopathy presents for evaluation of sleep disordered breathing. ? ?I have reviewed cardiology consultation from [facility] -LVEF remains low but CPET encouraging with peak VO2 near 16, plan is to hold off on cardiac transplant.  I have reviewed his previous sleep evaluation by Dr. Claiborne Billings ?He had a baseline study in 2017 when he weighed 244 pounds which showed severe OSA with AHI 27/hour worse during supine sleep.  On a subsequent CPAP titration study centrals seem to emerge at low pressures.  He was started on CPAP for some time but then switched to BiPAP, and subsequent BiPAP titration study in 2019 centrals seem to persist.  ASV was contemplated but due to his cardiomyopathy this was not started. ?He is on goal-directed therapy and has lost weight to his current weight of 210 pounds.  Bedtime is between 10:11 PM, he is settled down with a full facemask, he sleeps on his side with 4 pillows, denies nocturnal awakenings, takes his diuretics in the daytime and is out of bed at 6 AM feeling rested without dryness of mouth or headaches. ?BiPAP compliance was reviewed on auto BiPAP settings which shows residual AHI of 38/hour with centrals 30/hour and sporadic compliance with minimal leak ?He reports occasional fatigue when he does not use the BiPAP ? ?He has a second problem of coughing for the last 3 weeks.  He smoked for about 15 pack years until he quit at age 30.  He was evaluated by my pulmonology partner Dr. Shearon Stalls.  He was then started on Breztri by his PCP but his cough is persistent.  He went to urgent care and was given Ladona Ridgel this has not helped.  He reports small amount of white sticky sputum ?He has been maintained on Entresto for many years.  He denies preceding URI or sick contacts ? ? ?PMH -advanced NICM (EF 20%) s/p MDT ICD,  ?prior CABG (for iatrogenic LM  dissection during diagnostic cath in 2007), CKD stage III,  ?atrial fibrillation on eliquis ? type 2 diabetes mellitus ? ?Significant tests/ events reviewed ? ?PFTs 03/2020 normal ratio 83, FEV1 69%, FVC 66%, DLCO 76% ? ?CT chest without con 07/2021 stable 7 mm right upper lobe nodule compared to 2019, enlarged pulmonary artery ? ?Split Night: 03/2016: (244 lbs ) AHI 26.7/h, RDI 33.5/h; supine sleep, AHI 43.3/h ? ?03/2018 CPAP titration (244 )CPAP was initiated at 8/4 and was titrated up to 17/13.  Frequent central events developed, particularly at pressures greater than 12/8.  At 10/6  AHI was 0 and RDI 10.7. At 12/8 AHI was 7.7, with an RDI of 23.1 - Moderate Central Sleep Apnea was noted during this titration (CAI = 17.7/h). ? ?07/2018 BiPAP titration -centrals emerged on 12/4 and persisted 14/4 ? ?10/2021 titration - wt 205 lbs -CPAP titrated from 6 to 14 cm, BiPAP tried 18/14 and titrated to 25/20 centrals persisted ? ?Past Medical History:  ?Diagnosis Date  ? AICD (automatic cardioverter/defibrillator) present   ? Anxiety 08/20/2016  ? Cardiomyopathy   ? Nonischemic. EF has been about 45%. S/P CABG.;  b.  Echo 4/14: EF 25%, global HK with inf and mid apical AK, restrictive physiology with E/e' > 15 (elevated LV filling pressure), trivial AI/MR, mild to mod LAE, mild RVE, mild reduced RVSF, mild RAE, mod TR, PASP 74 (severe pulmonary HTN)   ? CHF (  congestive heart failure) ([place])   ? CHF exacerbation ([place]) 07/31/2016  ? Colon polyps 2002  ? Coronary artery disease   ? Diabetes mellitus without complication ([place])   ? Type II  ? Dyslipidemia   ? hx (03/20/2017)  ? GERD (gastroesophageal reflux disease)   ? History of bleeding peptic ulcer   ? History of echocardiogram   ? Echo 10/16:  EF 25-30%, poss non-compaction, diff HK with inf-lat and apical HK, restrictive physio, mild AI, severe LAE, mild RVE with mild reduced RVSF, PASP 42 mmHg  ? Hyperplastic rectal polyp   ? Moderate episode of recurrent major depressive  disorder ([place]) 08/20/2016  ? 09/14/20 - not current  ? OSA on CPAP   ? Mild  ? Pneumonia 09/2018  ? Tobacco abuse   ? Remote  ? ?Past Surgical History:  ?Procedure Laterality Date  ? CARDIAC CATHETERIZATION  2007;  2009  ? CARDIAC DEFIBRILLATOR PLACEMENT  03/20/2017  ? COLONOSCOPY WITH PROPOFOL N/A 01/21/2019  ? Procedure: COLONOSCOPY WITH PROPOFOL;  Surgeon: Milus Banister, MD;  [facility];  Service: Endoscopy;  Laterality: N/A;  ? CORONARY ARTERY BYPASS GRAFT  2007  ? Had a left main dissection after catheterization. Underwent a saphenous vein graft to the LAD and a saphenous vein graft to obtuse marginal.  ? HEMORRHOID SURGERY N/A 08/23/2020  ? Procedure: HEMORRHOIDECTOMY WITH LIGATION AND HEMORRHOIDOPEXY;  Surgeon: Michael Boston, MD;  [facility];  Service: General;  Laterality: N/A;  ? ICD IMPLANT N/A 03/20/2017  ? Procedure: ICD Implant;  Surgeon: Constance Haw, MD;  Location: [facility];  Service: Cardiovascular;  Laterality: N/A;  ? PATELLAR TENDON REPAIR Right 2005  ? PATELLAR TENDON REPAIR Right 09/21/2020  ? Procedure: PATELLA TENDON REPAIR;  Surgeon: Leandrew Koyanagi, MD;  Location: [facility];  Service: Orthopedics;  Laterality: Right;  ? POLYPECTOMY  01/21/2019  ? Procedure: POLYPECTOMY;  Surgeon: Milus Banister, MD;  [facility];  Service: Endoscopy;;  ? RECTAL EXAM UNDER ANESTHESIA Left 08/23/2020  ? Procedure: RECTAL EXAM UNDER ANESTHESIA;  Surgeon: Michael Boston, MD;  [facility];  Service: General;  Laterality: Left;  ? ? ?No Known Allergies ? ?Social History  ? ?Socioeconomic History  ? Marital status: Divorced  ?  Spouse name: Not on file  ? Number of children: 5  ? Years of education: Not on file  ? Highest education level: Not on file  ?Occupational History  ? Occupation: Civil engineer, contracting  ?  Employer: [employer]  ?Tobacco Use  ? Smoking status: Former  ?  Packs/day: 0.25  ?  Years: 7.00  ?  Pack years: 1.75  ?  Types: Cigarettes  ? Smokeless  tobacco: Never  ? Tobacco comments:  ?  03/20/2017 "quit in 1997  ?Vaping Use  ? Vaping Use: Former  ? Devices: vaped for about a month  ?Substance and Sexual Activity  ? Alcohol use: Not Currently  ?  Alcohol/week: 0.0 standard drinks  ?  Comment: twice a month no preference  ? Drug use: Yes  ?  Frequency: 1.0 times per week  ?  Types: Marijuana  ?  Comment: last time 09/15/20  ? Sexual activity: Yes  ?Other Topics Concern  ? Not on file  ?Social History Narrative  ? Single  ? ?Social Determinants of Health  ? ?Financial Resource Strain: Low Risk   ? Difficulty of Paying Living Expenses: Not hard at all  ?Food Insecurity: No Food Insecurity  ?  Worried About Charity fundraiser in the Last Year: Never true  ? Ran Out of Food in the Last Year: Never true  ?Transportation Needs: No Transportation Needs  ? Lack of Transportation (Medical): No  ? Lack of Transportation (Non-Medical): No  ?Physical Activity: Inactive  ? Days of Exercise per Week: 0 days  ? Minutes of Exercise per Session: 0 min  ?Stress: No Stress Concern Present  ? Feeling of Stress : Only a little  ?Social Connections: Not on file  ?Intimate Partner Violence: Not on file  ? ? ?Family History  ?Problem Relation Age of Onset  ? Hypertension Mother   ? Osteoarthritis Mother   ? Heart failure Father 23  ? High blood pressure Father   ? Lung cancer Other   ? Cancer Maternal Grandmother   ?     uncertain type  ? Bipolar disorder Daughter   ? Diabetes Daughter   ? Cancer Maternal Uncle   ?     uncertain type  ? Diabetes Niece   ? Asthma Neg Hx   ? ? ? ? ?Review of Systems ?Shortness of breath on exertion ?Cough, white sticky sputum ?Lower extremity edema ?Fatigue ? ?Constitutional: negative for anorexia, fevers and sweats  ?Eyes: negative for irritation, redness and visual disturbance  ?Ears, nose, mouth, throat, and face: negative for earaches, epistaxis, nasal congestion and sore throat  ? ?Cardiovascular: negative for chest pain, orthopnea, palpitations and  syncope  ?Gastrointestinal: negative for abdominal pain, constipation, diarrhea, melena, nausea and vomiting  ?Genitourinary:negative for dysuria, frequency and hematuria  ?Hematologic/lymphatic: negative for blee

## 2022-01-28 NOTE — Assessment & Plan Note (Signed)
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  Unfortunately due to severe cardiomyopathy we cannot use ASV mode of ventilation during sleep. ?Of note, he has lost about 35 pounds since his original diagnosis of OSA, it is possible that his OSA is significantly improved.  I will repeat a baseline nocturnal polysomnogram to reassess. ?If AHI is less than 15, then we can consider no therapy or positional therapy alone since cardiovascular impact is minimal.  If AHI is more than 15 then options would include NIV or considering inspire implant.  He already has an ICD ?At this point I do not think that BiPAP is helping him since residual events are higher than his original sleep study.  I have asked him to discontinue BiPAP for now ?

## 2022-01-28 NOTE — Patient Instructions (Signed)
?  xPrednisone 10 mg tabs Take 4 tabs  daily with food x 3 days, then 3 tabs daily x 3 days, then 2 tabs daily x 3 days, then 1 tab daily x3 days then stop. #30 ? ? ?Take zyrtec for pollen ? ?X NPSG for sleep apnea ?

## 2022-01-28 NOTE — Progress Notes (Signed)
? ?Chronic Care Management ?Pharmacy Note ? ?01/29/2022 ?Name:  Bruce Robertson MRN:  [id number] DOB:  [birth year] ? ?Summary: ?A1c not ideally at goal < 7% ?LDL not ideally at goal < 70 ?Pt is not taking Breztri as prescribed due to short supply ? ?Recommendations/Changes made from today's visit: ?-Recommended switching carvedilol to metoprolol due to difficulty breathing ?-Recommended repeat A1c in 1 month  ?-Recommended trial taper of omeprazole to see if patient can take PRN ?-Recommended taking Breztri daily as prescribed ?-Consider repeat spirometry ? ?Plan: ?Applied for PAP for Breztri and requested prescription to fax ?BP and DM assessment in 1 month ? ?Subjective: ?Bruce Robertson is an 61 y.o. year old male who is a primary patient of Koberlein, Steele Berg, MD.  The CCM team was consulted for assistance with disease management and care coordination needs.   ? ?Engaged with patient face to face for initial visit in response to provider referral for pharmacy case management and/or care coordination services.  ? ?Consent to Services:  ?The patient was given the following information about Chronic Care Management services today, agreed to services, and gave verbal consent: 1. CCM service includes personalized support from designated clinical staff supervised by the primary care provider, including individualized plan of care and coordination with other care providers 2. 24/7 contact phone numbers for assistance for urgent and routine care needs. 3. Service will only be billed when office clinical staff spend 20 minutes or more in a month to coordinate care. 4. Only one practitioner may furnish and bill the service in a calendar month. 5.The patient may stop CCM services at any time (effective at the end of the month) by phone call to the office staff. 6. The patient will be responsible for cost sharing (co-pay) of up to 20% of the service fee (after annual deductible is met). Patient agreed to services and consent  obtained. ? ?Patient Care Team: ?Caren Macadam, MD as PCP - General (Family Medicine) ?Constance Haw, MD as PCP - Electrophysiology (Cardiology) ?Minus Breeding, MD as PCP - [facility] (Cardiology) ?Minus Breeding, MD as PCP - Cardiology (Cardiology) ?Constance Haw, MD as Consulting Physician (Cardiology) ?Milus Banister, MD as Attending Physician (Gastroenterology) ?Michael Boston, MD as Consulting Physician (General Surgery) ?Riccardo Dubin, MD as Referring Physician (Cardiology) ?Viona Gilmore, [facility] as Pharmacist (Pharmacist) ? ?Recent office visits: ?01/04/2022 Micheline Rough MD - Patient was seen for Mass overlapping multiple quadrants of right breast and additional issues. No medication changes. No follow up noted.  ?  ?11/30/2021 Micheline Rough MD - Patient was seen for shortness of breath and additional issues. Started Breztri 160/9/4.8 mcg/ACT twice daily. Discontinued Flexeril. No follow up noted.  ?  ?11/20/2021 Glenna Durand LPN - Medicare annual wellness exam. ? ?Recent consult visits: ?01/28/2022 Kara Mead (pulmonary) - Patient was seen for Obstructive sleep apnea. Prescribed prednisone taper. Recommended Zyrtec for pollen. Follow up in 3 months. ? ?01/10/2022 Lattie Haw [facility] Cardiology) - Patient was seen for Chronic systolic CHF. No medications changes. Follow up in 6 months.  ?  ?12/26/2021 Lenice Llamas MD (pulmonary) - Patient was seen for OSA treated with BiPAP and additional issues. No medication changes. Follow up in 2 weeks.  ?  ?11/29/2021 Tommye Standard PA-C (cardiology) - Patient was seen for Paroxysmal atrial fibrillation and additional issues.  Decreased Carvedilol to 6.25 mg twice daily. Follow up in 3 months.  ?  ?11/22/2021 Lattie Haw [facility] Cardiology) - Patient was seen for Chronic systolic CHF.  No medications changes. Follow up in 6 months. ?  ?11/15/2021 Will Pillow (cardiology) - Patient was seen for Dilated cardiomyopathy. No other  notes.  ?  ?10/10/2021 Kara Mead (pulmonary) - Patient was seen for Obstructive sleep apnea. No other notes.  ?  ?10/05/2021 Minus Breeding MD (cardiology) - Patient was seen for Cardiomyopathy, dilated, nonischemic and additional issues. Increased Torsemide 40 mg daily. Discontinued dexamethasone, dicyclomine, norco, omeprazole and ondansetron. Follow up in  6 months.  ?  ?08/16/2021 Delorise Shiner MD (pulmonary) - Patient was seen for Gastroesophageal reflux disease without esophagitis and additional issues. Follow up in 3 months.  ? ?Hospital visits: ?01/22/22 Patient presented to [facility] at [facility] for acute upper respiratory infection and cough. Prescribed Flonase and benzonatate. Avoid steroids due to reduced EF. ? ? ?Objective: ? ?Lab Results  ?Component Value Date  ? CREATININE 1.60 (H) 01/03/2022  ? BUN 22 01/03/2022  ? GFR 47.04 (L) 01/03/2022  ? EGFR 47 (L) 10/05/2021  ? GFRNONAA 46 (L) 09/18/2020  ? GFRAA 42 (L) 07/24/2020  ? NA 136 01/03/2022  ? K 3.7 01/03/2022  ? CALCIUM 9.5 01/03/2022  ? CO2 32 01/03/2022  ? GLUCOSE 99 01/03/2022  ? ? ?Lab Results  ?Component Value Date/Time  ? HGBA1C 7.0 (H) 11/30/2021 01:01 PM  ? HGBA1C 6.5 (A) 07/04/2021 10:20 AM  ? HGBA1C 7.1 (H) 03/05/2021 08:30 AM  ? GFR 47.04 (L) 01/03/2022 08:22 AM  ? GFR 41.42 (L) 11/30/2021 01:01 PM  ? MICROALBUR <0.7 11/30/2021 01:01 PM  ? MICROALBUR <0.7 12/01/2020 10:33 AM  ?  ?Last diabetic Eye exam: No results found for: HMDIABEYEEXA  ?Last diabetic Foot exam: No results found for: HMDIABFOOTEX  ? ?Lab Results  ?Component Value Date  ? CHOL 141 11/30/2021  ? HDL 48.90 11/30/2021  ? [place] 76 11/30/2021  ? LDLDIRECT 166.4 05/17/2010  ? TRIG 82.0 11/30/2021  ? CHOLHDL 3 11/30/2021  ? ? ? ?  Latest Ref Rng & Units 01/03/2022  ?  8:22 AM 11/30/2021  ?  1:01 PM 05/30/2021  ?  8:48 AM  ?Hepatic Function  ?Total Protein 6.0 - 8.3 g/dL 6.8   7.5   6.8    ?Albumin 3.5 - 5.2 g/dL 4.0   4.3   4.1    ?AST 0 - 37 U/L _0 ?ALT 0 - 53 U/L _1 ?Alk Phosphatase 39 - 117 U/L 57   57   61    ?Total Bilirubin 0.2 - 1.2 mg/dL 0.7   1.3   0.4    ? ? ?Lab Results  ?Component Value Date/Time  ? TSH 1.59 11/30/2021 01:01 PM  ? TSH 1.31 05/30/2021 08:48 AM  ? FREET4 1.29 11/30/2021 01:01 PM  ? FREET4 0.88 03/05/2021 08:29 AM  ? ? ? ?  Latest Ref Rng & Units 05/30/2021  ?  8:48 AM 12/01/2020  ? 10:33 AM 08/15/2020  ? 11:50 AM  ?CBC  ?WBC 4.0 - 10.5 K/uL 10.4   7.4   5.5    ?Hemoglobin 13.0 - 17.0 g/dL 12.6   12.3   11.9    ?Hematocrit 39.0 - 52.0 % 40.0   40.1   37.8    ?Platelets 150.0 - 400.0 K/uL 159.0   190.0   167    ? ? ?Lab Results  ?Component Value Date/Time  ? VD25OH 32.91 12/01/2020 10:33 AM  ? ? ?  Clinical ASCVD: No  ?The 10-year ASCVD risk score (Arnett DK, et al., 2019) is: 18.5% ?  Values used to calculate the score: ?    Age: 48 years ?    Sex: Male ?    Is Non-Hispanic African American: Yes ?    Diabetic: Yes ?    Tobacco smoker: No ?    Systolic Blood Pressure: 056 mmHg ?    Is BP treated: Yes ?    HDL Cholesterol: 48.9 mg/dL ?    Total Cholesterol: 141 mg/dL   ? ? ?  11/30/2021  ? 11:27 AM 11/20/2021  ?  9:29 AM 12/01/2020  ? 10:36 AM  ?Depression screen PHQ 2/9  ?Decreased Interest 0 0 1  ?Down, Depressed, Hopeless 1 0 1  ?PHQ - 2 Score 1 0 2  ?Altered sleeping 2  1  ?Tired, decreased energy 3  2  ?Change in appetite 0  0  ?Feeling bad or failure about yourself  0  1  ?Trouble concentrating 0  1  ?Moving slowly or fidgety/restless 0  1  ?Suicidal thoughts 0  0  ?PHQ-9 Score 6  8  ?  ?CHA2DS2/VAS Stroke Risk Points  Current as of 11 minutes ago  ?   4 >= 2 Points: High Risk  ?1 - 1.99 Points: Medium Risk  ?0 Points: Low Risk  ?  Last Change: N/A   ?  ? Details   ? This score determines the patient's risk of having a stroke if the  ?patient has atrial fibrillation.  ?  ?  ? Points Metrics  ?1 Has Congestive Heart Failure:  Yes   ? Current as of 11 minutes ago  ?1 Has Vascular Disease:  Yes    ? Current as of 11  minutes ago  ?1 Has Hypertension:  Yes   ? Current as of 11 minutes ago  ?0 Age:  62   ? Current as of 11 minutes ago  ?1 Has Diabetes:  Yes   ? Current as of 11 minutes ago  ?0 Had Stroke:  No  Had TIA:  No  Lamonte Sakai

## 2022-01-29 ENCOUNTER — Other Ambulatory Visit: Payer: Self-pay | Admitting: *Deleted

## 2022-01-29 ENCOUNTER — Ambulatory Visit (INDEPENDENT_AMBULATORY_CARE_PROVIDER_SITE_OTHER): Payer: Medicare HMO | Admitting: Pharmacist

## 2022-01-29 VITALS — BP 110/60

## 2022-01-29 DIAGNOSIS — I5022 Chronic systolic (congestive) heart failure: Secondary | ICD-10-CM

## 2022-01-29 DIAGNOSIS — E1165 Type 2 diabetes mellitus with hyperglycemia: Secondary | ICD-10-CM

## 2022-01-29 MED ORDER — BREZTRI AEROSPHERE 160-9-4.8 MCG/ACT IN AERO: 2.0000 | INHALATION_SPRAY | Freq: Two times a day (BID) | RESPIRATORY_TRACT | 0 refills | Status: DC

## 2022-01-29 NOTE — Patient Instructions (Addendum)
Bruce Robertson, ? ?It was great to get to meet you in person! Below is a summary of some of the topics we discussed.  ? ?Please be on the lookout for communication from [employer] or [employer] for [place] shipment. Don't forget to go ahead and start taking the [facility] as prescribed - 2 puffs twice daily - to get the most benefit from it. ? ?Please reach out to me if you have any questions or need anything before our follow up! ? ?Best, ?Maddie ? ?Jeni Salles, PharmD, BCACP ?Clinical Pharmacist ?Therapist, music at [place] ?[phone] ? ? Visit Information ? ? Goals Addressed   ? ?  ?  ?  ?  ? This Visit's Progress  ?  Monitor and Manage My Blood Sugar-Diabetes Type 2     ?  Timeframe:  Long-Range Goal ?Priority:  High ?Start Date:                             ?Expected End Date:                      ? ?Follow Up Date 05/01/22  ?  ?- check blood sugar at prescribed times ?- check blood sugar if I feel it is too high or too low ?- take the blood sugar log to all doctor visits  ?  ?Why is this important?   ?Checking your blood sugar at home helps to keep it from getting very high or very low.  ?Writing the results in a diary or log helps the doctor know how to care for you.  ?Your blood sugar log should have the time, date and the results.  ?Also, write down the amount of insulin or other medicine that you take.  ?Other information, like what you ate, exercise done and how you were feeling, will also be helpful.   ?  ?Notes:  ?  ? ?  ? ?Patient Care Plan: [place]  ?  ? ?Problem Identified: Problem: Hypertension, Hyperlipidemia, Atrial Fibrillation, Heart Failure, GERD, Anxiety, and OSA   ?  ? ?Long-Range Goal: Patient-Specific Goal   ?Start Date: 01/29/2022  ?Expected End Date: 01/30/2023  ?This Visit's Progress: On track  ?Priority: High  ?Note:   ?Current Barriers:  ?Unable to independently afford treatment regimen ?Unable to independently monitor therapeutic efficacy ?Suboptimal therapeutic  regimen for heart failure ?Unable to self administer medications as prescribed ? ?Pharmacist Clinical Goal(s):  ?Patient will verbalize ability to afford treatment regimen ?achieve adherence to monitoring guidelines and medication adherence to achieve therapeutic efficacy through collaboration with PharmD and provider.  ? ?Interventions: ?1:1 collaboration with Caren Macadam, MD regarding development and update of comprehensive plan of care as evidenced by provider attestation and co-signature ?Inter-disciplinary care team collaboration (see longitudinal plan of care) ?Comprehensive medication review performed; medication list updated in electronic medical record ? ?Hypertension (BP goal <130/80) ?-Controlled ?-Current treatment: ?Carvedilol 12.5 mg 1/2 tablet twice daily - Appropriate, Effective, Query Safe, Accessible ?Entresto 24-26 mg 1 tablet twice daily - Appropriate, Effective, Safe, Accessible ?Spironolactone 25 mg 1 tablet daily - Appropriate, Effective, Safe, Accessible ?-Medications previously tried: unknown  ?-Current home readings: 90-110/60-70s; 2-3 times a week checking at home (feels sluggish when its low)  ?-Current dietary habits: limits salt intake ?-Current exercise habits: exercising now more regularly ?-Reports hypotensive/hypertensive symptoms ?-Educated on BP goals and benefits of medications for prevention of heart attack, stroke and kidney  damage; ?Proper BP monitoring technique; ?Symptoms of hypotension and importance of maintaining adequate hydration; ?-Counseled to monitor BP at home a few times a week, document, and provide log at future appointments ?-Counseled on diet and exercise extensively ?Recommended to continue current medication ?Recommended switching carvedilol to metoprolol due to difficulty breathing. ? ?Hyperlipidemia: (LDL goal < 70) ?-Not ideally controlled ?-Current treatment: ?Simvastatin 40 mg 1 tablet daily - Appropriate, Query effective, Safe,  Accessible ?-Medications previously tried: none  ?-Current dietary patterns: eats very little red meat; mostly chicken and fish ?-Current exercise habits: going to the gym more now ?-Educated on Cholesterol goals;  ?Benefits of statin for ASCVD risk reduction; ?Importance of limiting foods high in cholesterol; ?Exercise goal of 150 minutes per week; ?-Counseled on diet and exercise extensively ?Recommended to continue current medication ? ?Diabetes (A1c goal <7%) ?-Not ideally controlled ?-Current medications: ?Jardiance 10 mg 1 tablet daily - Appropriate, Query effective, Safe, Accessible ?-Medications previously tried: none  ?-Current home glucose readings ?fasting glucose: checked 2 weeks ago - first thing in AM but unsure of readings ?post prandial glucose: n/a ?-Denies hypoglycemic/hyperglycemic symptoms ?-Current meal patterns:  ?breakfast: did not discuss  ?lunch: did not discuss   ?dinner: did not discuss  ?snacks: did not discuss  ?drinks: did not discuss  ?-Current exercise: going to the gym now ?-Educated on A1c and blood sugar goals; ?Benefits of routine self-monitoring of blood sugar; ?Carbohydrate counting and/or plate method ?-Counseled to check feet daily and get yearly eye exams ?-Counseled on diet and exercise extensively ?Recommended repeat A1c. ? ?Atrial Fibrillation (Goal: prevent stroke and major bleeding) ?-Controlled ?-CHADSVASC: 4 ?-Current treatment: ?Rate control: carvedilol 6.25 mg 1 tablet twice daily - Appropriate, Effective, Safe, Accessible ?Anticoagulation: Eliquis 5 mg 1 tablet twice daily - Appropriate, Effective, Safe, Accessible ?-Medications previously tried: none ?-Home BP and HR readings: refer to above  ?-Counseled on increased risk of stroke due to Afib and benefits of anticoagulation for stroke prevention; ?importance of adherence to anticoagulant exactly as prescribed; ?bleeding risk associated with Eliquis and importance of self-monitoring for signs/symptoms of  bleeding; ?avoidance of NSAIDs due to increased bleeding risk with anticoagulants; ?-Recommended to continue current medication ? ?Heart Failure (Goal: manage symptoms and prevent exacerbations) ?-Controlled ?-Last ejection fraction: 15-20% (Date: 02/04/20) ?-HF type: Systolic ?-NYHA Class: III (marked limitation of activity) ?-AHA HF Stage: C (Heart disease and symptoms present) ?-Current treatment: ?Carvedilol 12.5 mg 1/2 tablet twice daily - Appropriate, Effective, Query Safe, Accessible ?Entresto 24-26 mg 1 tablet twice daily - Appropriate, Effective, Safe, Accessible ?Spironolactone 25 mg 1 tablet daily - Appropriate, Effective, Safe, Accessible ?Jardiance 10 mg 1 tablet daily - Appropriate, Effective, Safe, Accessible ?Torsemide 20 mg 2 tablets daily - Appropriate, Effective, Safe, Accessible ?-Medications previously tried: none  ?-Current home BP/HR readings: 90-110/60-70s; 2-3 times a week checking at home (feels sluggish when its low)  ?-Current dietary habits: eats as little sodium as possible; doesn't use salt at home, fresh or frozen; avoids things > 100 mg of sodium per serving; asks about sodium content in restaurants ?-Current exercise habits: going to the gym regularly now ?-Educated on Benefits of medications for managing symptoms and prolonging life ?Importance of weighing daily; if you gain more than 3 pounds in one day or 5 pounds in one week, call cardiologist. ?-Counseled on diet and exercise extensively ?Recommended to continue current medication ? ?OSA (Goal: control symptoms and prevent exacerbations) ?-Controlled ?-Current treatment  ?Breztri 160-9-4.8 mcg/act 2 puffs twice daily - taking once daily-  Appropriate, Effective, Safe, Query accessible ?Albuterol HFA as needed - Appropriate, Effective, Safe, Accessible ?-Medications previously tried: none  ?-Gold Grade: Gold 2 (FEV1 50-79%) ?-Current COPD Classification:  B (high sx, <2 exacerbations/yr) ?-MMRC/CAT score: n/a ?-Pulmonary function  testing: 03/2020 ?-Exacerbations requiring treatment in last 6 months: none ?-Patient reports consistent use of maintenance inhaler ?-Frequency of rescue inhaler use: not using with use of Breztri ?-Counseled on [employer] inhaler techni

## 2022-01-30 ENCOUNTER — Ambulatory Visit
Admission: RE | Admit: 2022-01-30 | Discharge: 2022-01-30 | Disposition: A | Discharge status: Home / Self Care | Payer: Medicare HMO | Source: Ambulatory Visit | Attending: Family Medicine | Admitting: Family Medicine

## 2022-01-30 ENCOUNTER — Ambulatory Visit: Payer: Medicare HMO

## 2022-01-30 DIAGNOSIS — N644 Mastodynia: Secondary | ICD-10-CM | POA: Diagnosis not present

## 2022-01-30 DIAGNOSIS — N62 Hypertrophy of breast: Secondary | ICD-10-CM | POA: Diagnosis not present

## 2022-01-30 DIAGNOSIS — N6315 Unspecified lump in the right breast, overlapping quadrants: Secondary | ICD-10-CM

## 2022-02-01 DIAGNOSIS — E1159 Type 2 diabetes mellitus with other circulatory complications: Secondary | ICD-10-CM

## 2022-02-01 DIAGNOSIS — I110 Hypertensive heart disease with heart failure: Secondary | ICD-10-CM

## 2022-02-01 DIAGNOSIS — I4891 Unspecified atrial fibrillation: Secondary | ICD-10-CM

## 2022-02-01 DIAGNOSIS — E785 Hyperlipidemia, unspecified: Secondary | ICD-10-CM | POA: Diagnosis not present

## 2022-02-01 DIAGNOSIS — I5020 Unspecified systolic (congestive) heart failure: Secondary | ICD-10-CM | POA: Diagnosis not present

## 2022-02-06 ENCOUNTER — Other Ambulatory Visit: Payer: Self-pay | Admitting: Family Medicine

## 2022-02-06 MED ORDER — BREZTRI AEROSPHERE 160-9-4.8 MCG/ACT IN AERO: 2.0000 | INHALATION_SPRAY | Freq: Two times a day (BID) | RESPIRATORY_TRACT | 3 refills | Status: DC

## 2022-02-06 MED ORDER — BREZTRI AEROSPHERE 160-9-4.8 MCG/ACT IN AERO: 2.0000 | INHALATION_SPRAY | Freq: Two times a day (BID) | RESPIRATORY_TRACT | 11 refills | Status: DC

## 2022-02-08 ENCOUNTER — Encounter: Payer: Self-pay | Admitting: Family Medicine

## 2022-02-12 ENCOUNTER — Other Ambulatory Visit: Payer: Self-pay | Admitting: Family Medicine

## 2022-02-14 ENCOUNTER — Ambulatory Visit (INDEPENDENT_AMBULATORY_CARE_PROVIDER_SITE_OTHER): Payer: Medicare HMO

## 2022-02-14 DIAGNOSIS — I420 Dilated cardiomyopathy: Secondary | ICD-10-CM

## 2022-02-14 DIAGNOSIS — I5022 Chronic systolic (congestive) heart failure: Secondary | ICD-10-CM

## 2022-02-18 LAB — CUP PACEART REMOTE DEVICE CHECK
Battery Remaining Longevity: 73 mo
Battery Voltage: 2.99 V
Brady Statistic RV Percent Paced: 0.28 %
Date Time Interrogation Session: 20230414231751
HighPow Impedance: 60 Ohm
Implantable Lead Implant Date: 20180517
Implantable Lead Location: 753860
Implantable Pulse Generator Implant Date: 20180517
Lead Channel Impedance Value: 342 Ohm
Lead Channel Impedance Value: 418 Ohm
Lead Channel Pacing Threshold Amplitude: 0.625 V
Lead Channel Pacing Threshold Pulse Width: 0.4 ms
Lead Channel Sensing Intrinsic Amplitude: 9.875 mV
Lead Channel Sensing Intrinsic Amplitude: 9.875 mV
Lead Channel Setting Pacing Amplitude: 2.5 V
Lead Channel Setting Pacing Pulse Width: 0.4 ms
Lead Channel Setting Sensing Sensitivity: 0.3 mV

## 2022-02-20 ENCOUNTER — Ambulatory Visit (HOSPITAL_BASED_OUTPATIENT_CLINIC_OR_DEPARTMENT_OTHER): Payer: Medicare HMO | Attending: Pulmonary Disease | Admitting: Pulmonary Disease

## 2022-02-20 DIAGNOSIS — G4733 Obstructive sleep apnea (adult) (pediatric): Secondary | ICD-10-CM | POA: Insufficient documentation

## 2022-02-20 DIAGNOSIS — G4736 Sleep related hypoventilation in conditions classified elsewhere: Secondary | ICD-10-CM | POA: Diagnosis not present

## 2022-02-20 DIAGNOSIS — G4731 Primary central sleep apnea: Secondary | ICD-10-CM | POA: Diagnosis not present

## 2022-02-27 DIAGNOSIS — G4733 Obstructive sleep apnea (adult) (pediatric): Secondary | ICD-10-CM

## 2022-02-27 NOTE — Procedures (Signed)
Patient Name: Bruce Robertson, Bruce Robertson ?Study Date: 02/20/2022 ?Gender: Male ?D.O.B: [birth year] ?Age (years): 40 ?Referring Provider: Lenice Llamas MD ?Height (inches): 72 ?Interpreting Physician: Kara Mead MD, ABSM ?Weight (lbs): 208 ?RPSGT: Zadie Rhine ?BMI: 28 ?MRN: [id number] ?Neck Size: 17.00 ?<br> <br> ?CLINICAL INFORMATION ?Sleep Study Type: NPSG ? ? ? ?Indication for sleep study: reassessment of known severe OSA after weight loss, treatment emergent centrals on PAP, advanced CHF ? ? ?Epworth Sleepiness Score: 14 ? ? ?Most recent polysomnogram dated 03/10/2016 revealed an AHI of 26.7/h and RDI of 33.5/h. Most recent titration study dated 10/08/2021 revealed an AHI of 0.0/h. ?SLEEP STUDY TECHNIQUE ?As per the AASM Manual for the Scoring of Sleep and Associated Events v2.3 (April 2016) with a hypopnea requiring 4% desaturations. ? ?The channels recorded and monitored were frontal, central and occipital EEG, electrooculogram (EOG), submentalis EMG (chin), nasal and oral airflow, thoracic and abdominal wall motion, anterior tibialis EMG, snore microphone, electrocardiogram, and pulse oximetry. ? ?MEDICATIONS ?Medications self-administered by patient taken the night of the study : N/A ? ?SLEEP ARCHITECTURE ?The study was initiated at 9:37:06 PM and ended at 3:44:10 AM. ? ?Sleep onset time was 22.2 minutes and the sleep efficiency was 79.3%. The total sleep time was 291 minutes. ? ?Stage REM latency was 46.5 minutes. ? ?The patient spent 28.18% of the night in stage N1 sleep, 49.48% in stage N2 sleep, 0.00% in stage N3 and 22.3% in REM. ? ?Alpha intrusion was absent. ? ?Supine sleep was 17.40%. ? ?RESPIRATORY PARAMETERS ?The overall apnea/hypopnea index (AHI) was 39.8 per hour. There were 122 total apneas, including 15 obstructive, 106 central and 1 mixed apneas. There were 71 hypopneas and 3 RERAs. Central apnea index was 22/h, obstructive index was 18/h ? ?The AHI during Stage REM sleep was 25.8 per hour. ? ?AHI while  supine was 53.3 per hour. ? ?The mean oxygen saturation was 91.87%. The minimum SpO2 during sleep was 83.00%. ? ?soft snoring was noted during this study. ? ?CARDIAC DATA ?The 2 lead EKG demonstrated pacemaker generated. The mean heart rate was 60.94 beats per minute. Other EKG findings include: None. ? ? ?LEG MOVEMENT DATA ?The total PLMS were 1 with a resulting PLMS index of 0.21. Associated arousal with leg movement index was 0.0 . ? ?IMPRESSIONS ?- Moderate obstructive sleep apnea occurred during this study (AHI = 39.8/h). ?- Moderate central sleep apnea occurred during this study (CAI = 21.9/h). ?- Mild oxygen desaturation was noted during this study (Min O2 = 83.00%). ?- The patient snored with soft snoring volume. ?- No cardiac abnormalities were noted during this study. ?- Clinically significant periodic limb movements did not occur during sleep. No significant associated arousals. ? ? ?DIAGNOSIS ?- Obstructive Sleep Apnea (G47.33) ?- Central Sleep Apnea (G47.37) ?- Nocturnal Hypoxemia (G47.36) ? ? ?RECOMMENDATIONS ?- CPAP/ BiPAP has been unable to eliminate sleep disordered breathing due to emergent central apneas. ASV is contra-indicated due to low EF. NIV may be required to eliminate central sleep apnea. ?- Optimise treatment of CHF. ?- Avoid alcohol, sedatives and other CNS depressants that may worsen sleep apnea and disrupt normal sleep architecture. ?- Sleep hygiene should be reviewed to assess factors that may improve sleep quality. ?- Weight management and regular exercise should be initiated or continued if appropriate. ? ? ?Kara Mead MD ?Board Certified in Sleep medicine ? ?

## 2022-03-01 NOTE — Progress Notes (Signed)
Remote ICD transmission.   

## 2022-03-07 ENCOUNTER — Other Ambulatory Visit: Payer: Self-pay

## 2022-03-07 DIAGNOSIS — G4733 Obstructive sleep apnea (adult) (pediatric): Secondary | ICD-10-CM

## 2022-03-07 DIAGNOSIS — I5020 Unspecified systolic (congestive) heart failure: Secondary | ICD-10-CM

## 2022-03-12 ENCOUNTER — Encounter: Payer: Self-pay | Admitting: Cardiology

## 2022-03-12 ENCOUNTER — Encounter: Payer: Self-pay | Admitting: Internal Medicine

## 2022-03-12 ENCOUNTER — Ambulatory Visit (INDEPENDENT_AMBULATORY_CARE_PROVIDER_SITE_OTHER): Payer: Medicare HMO | Admitting: Cardiology

## 2022-03-12 VITALS — BP 88/52 | HR 53 | Ht 72.0 in | Wt 214.4 lb

## 2022-03-12 DIAGNOSIS — I429 Cardiomyopathy, unspecified: Secondary | ICD-10-CM

## 2022-03-12 DIAGNOSIS — I5022 Chronic systolic (congestive) heart failure: Secondary | ICD-10-CM

## 2022-03-12 DIAGNOSIS — E785 Hyperlipidemia, unspecified: Secondary | ICD-10-CM | POA: Diagnosis not present

## 2022-03-12 DIAGNOSIS — G4733 Obstructive sleep apnea (adult) (pediatric): Secondary | ICD-10-CM

## 2022-03-12 DIAGNOSIS — I480 Paroxysmal atrial fibrillation: Secondary | ICD-10-CM | POA: Diagnosis not present

## 2022-03-12 MED ORDER — METOPROLOL SUCCINATE ER 50 MG PO TB24: 50.0000 mg | ORAL_TABLET | Freq: Every day | ORAL | 6 refills | Status: DC

## 2022-03-12 NOTE — Patient Instructions (Signed)
Medication Instructions:  ?Your physician has recommended you make the following change in your medication:  ?STOP Carvedilol ?START Metoprolol Succinate (Toprol) 50 mg once daily at bedtime ? ?*If you need a refill on your cardiac medications before your next appointment, please call your pharmacy* ? ? ?Lab Work: ?None ordered ? ? ?Testing/Procedures: ?None ordered ? ? ?Follow-Up: ?At [facility], you and your health needs are our priority.  As part of our continuing mission to provide you with exceptional heart care, we have created designated Provider Care Teams.  These Care Teams include your primary Cardiologist (physician) and Advanced Practice Providers (APPs -  Physician Assistants and Nurse Practitioners) who all work together to provide you with the care you need, when you need it. ? ?Your next appointment:   ?6 month(s) ? ?The format for your next appointment:   ?In Person ? ?Provider:   ?Tommye Standard, PA-C  ? ? ?Thank you for choosing [facility]!! ? ? ?Bruce Curet, RN ?[phone] ?Other Instructions ? ? ? ?Important Information About Sugar ? ? ? ? ? ? ? ?Metoprolol Extended-Release Tablets ?What is this medication? ?METOPROLOL (me TOE proe lole) treats high blood pressure and heart failure. It may also be used to prevent chest pain (angina). It works by lowering your blood pressure and heart rate, making it easier for your heart to pump blood to the rest of your body. It belongs to a group of medications called beta blockers. ?This medicine may be used for other purposes; ask your health care provider or pharmacist if you have questions. ?COMMON BRAND NAME(S): toprol, Toprol XL ?What should I tell my care team before I take this medication? ?They need to know if you have any of these conditions: ?Diabetes ?Heart or vessel disease like slow heart rate, worsening heart failure, heart block, sick sinus syndrome, or Raynaud's disease ?Kidney disease ?Liver disease ?Lung or breathing disease,  like asthma or emphysema ?Pheochromocytoma ?Thyroid disease ?An unusual or allergic reaction to metoprolol, other beta blockers, medications, foods, dyes, or preservatives ?Pregnant or trying to get pregnant ?Breast-feeding ?How should I use this medication? ?Take this medication by mouth. Take it as directed on the prescription label at the same time every day. Take it with food. You may cut the tablet in half if it is scored (has a line in the middle of it). This may help you swallow the tablet if the whole tablet is too big. Be sure to take both halves. Do not take just one-half of the tablet. Keep taking it unless your care team tells you to stop. ?Talk to your care team about the use of this medication in children. While it may be prescribed for children as young as 6 years for selected conditions, precautions do apply. ?Overdosage: If you think you have taken too much of this medicine contact a poison control center or emergency room at once. ?NOTE: This medicine is only for you. Do not share this medicine with others. ?What if I miss a dose? ?If you miss a dose, take it as soon as you can. If it is almost time for your next dose, take only that dose. Do not take double or extra doses. ?What may interact with this medication? ?This medication may interact with the following: ?Certain medications for blood pressure, heart disease, irregular heartbeat ?Certain medications for depression, like monoamine oxidase (MAO) inhibitors, fluoxetine, or paroxetine ?Clonidine ?Dobutamine ?Epinephrine ?Isoproterenol ?Reserpine ?This list may not describe all possible interactions. Give your health  care provider a list of all the medicines, herbs, non-prescription drugs, or dietary supplements you use. Also tell them if you smoke, drink alcohol, or use illegal drugs. Some items may interact with your medicine. ?What should I watch for while using this medication? ?Visit your care team for regular checks on your progress.  Check your blood pressure as directed. Ask your care team what your blood pressure should be. Also, find out when you should contact them. ?Do not treat yourself for coughs, colds, or pain while you are using this medication without asking your care team for advice. Some medications may increase your blood pressure. ?You may get drowsy or dizzy. Do not drive, use machinery, or do anything that needs mental alertness until you know how this medication affects you. Do not stand up or sit up quickly, especially if you are an older patient. This reduces the risk of dizzy or fainting spells. Alcohol may interfere with the effect of this medication. Avoid alcoholic drinks. ?This medication may increase blood sugar. Ask your care team if changes in diet or medications are needed if you have diabetes. ?What side effects may I notice from receiving this medication? ?Side effects that you should report to your care team as soon as possible: ?Allergic reactions--skin rash, itching, hives, swelling of the face, lips, tongue, or throat ?Heart failure--shortness of breath, swelling of the ankles, feet, or hands, sudden weight gain, unusual weakness or fatigue ?Low blood pressure--dizziness, feeling faint or lightheaded, blurry vision ?Raynaud's--cool, numb, or painful fingers or toes that may change color from pale, to blue, to red ?Slow heartbeat--dizziness, feeling faint or lightheaded, confusion, trouble breathing, unusual weakness or fatigue ?Worsening mood, feelings of depression ?Side effects that usually do not require medical attention (report to your care team if they continue or are bothersome): ?Change in sex drive or performance ?Diarrhea ?Dizziness ?Fatigue ?Headache ?This list may not describe all possible side effects. Call your doctor for medical advice about side effects. You may report side effects to FDA at 1-800-FDA-1088. ?Where should I keep my medication? ?Keep out of the reach of children and pets. ?Store  at room temperature between 20 and 25 degrees C (68 and 77 degrees F). Throw away any unused medication after the expiration date. ?NOTE: This sheet is a summary. It may not cover all possible information. If you have questions about this medicine, talk to your doctor, pharmacist, or health care provider. ?? 2023 Elsevier/Gold Standard (2021-09-21 00:00:00) ? ?  ?

## 2022-03-12 NOTE — Progress Notes (Signed)
? ?Electrophysiology Office Note ? ? ?Date:  03/12/2022  ? ?ID:  Bruce Robertson, DOB 1961-10-07, MRN [id number] ? ?PCP:  Caren Macadam, MD  ?Cardiologist:  [place] ?Primary Electrophysiologist:  Alisia Vanengen [name], MD   ? ?No chief complaint on file. ? ?  ?History of Present Illness: ?Bruce Robertson is a 61 y.o. male who is being seen today for the evaluation of nonischemic cardiomyopathy at the request of [name], MD. ?Presenting today for electrophysiology evaluation.  ? ?3 significant for chronic systolic heart failure due to nonischemic cardiomyopathy, diabetes.  He is status post Medtronic ICD implanted 03/20/2017. ? ?Today, denies symptoms of palpitations, chest pain, shortness of breath, orthopnea, PND, lower extremity edema, claudication, dizziness, presyncope, syncope, bleeding, or neurologic sequela. The patient is tolerating medications without difficulties.  Has been feeling quite fatigued.  He has no chest pain or shortness of breath.  He is able to do most of his daily activities. ? ? ?Past Medical History:  ?Diagnosis Date  ? AICD (automatic cardioverter/defibrillator) present   ? Anxiety 08/20/2016  ? Barrett's esophagus 2002  ? Cardiomyopathy   ? Nonischemic. EF has been about 45%. S/P CABG.;  b.  Echo 4/14: EF 25%, global HK with inf and mid apical AK, restrictive physiology with E/e' > 15 (elevated LV filling pressure), trivial AI/MR, mild to mod LAE, mild RVE, mild reduced RVSF, mild RAE, mod TR, PASP 74 (severe pulmonary HTN)   ? CHF (congestive heart failure) ([place])   ? CHF exacerbation ([place]) 07/31/2016  ? Colon polyps 2002  ? Coronary artery disease   ? Diabetes mellitus without complication ([place])   ? Type II  ? Dyslipidemia   ? hx (03/20/2017)  ? GERD (gastroesophageal reflux disease)   ? History of bleeding peptic ulcer   ? History of echocardiogram   ? Echo 10/16:  EF 25-30%, poss non-compaction, diff HK with inf-lat and apical HK, restrictive physio, mild AI, severe LAE, mild  RVE with mild reduced RVSF, PASP 42 mmHg  ? Hyperplastic rectal polyp   ? Moderate episode of recurrent major depressive disorder ([place]) 08/20/2016  ? 09/14/20 - not current  ? OSA on CPAP   ? Mild  ? Pneumonia 09/2018  ? Tobacco abuse   ? Remote  ? ?Past Surgical History:  ?Procedure Laterality Date  ? CARDIAC CATHETERIZATION  2007;  2009  ? CARDIAC DEFIBRILLATOR PLACEMENT  03/20/2017  ? COLONOSCOPY WITH PROPOFOL N/A 01/21/2019  ? Procedure: COLONOSCOPY WITH PROPOFOL;  Surgeon: Milus Banister, MD;  Location: [facility];  Service: Endoscopy;  Laterality: N/A;  ? CORONARY ARTERY BYPASS GRAFT  2007  ? Had a left main dissection after catheterization. Underwent a saphenous vein graft to the LAD and a saphenous vein graft to obtuse marginal.  ? HEMORRHOID SURGERY N/A 08/23/2020  ? Procedure: HEMORRHOIDECTOMY WITH LIGATION AND HEMORRHOIDOPEXY;  Surgeon: Michael Boston, MD;  Location: [facility];  Service: General;  Laterality: N/A;  ? ICD IMPLANT N/A 03/20/2017  ? Procedure: ICD Implant;  Surgeon: Constance Haw, MD;  Location: [facility];  Service: Cardiovascular;  Laterality: N/A;  ? PATELLAR TENDON REPAIR Right 2005  ? PATELLAR TENDON REPAIR Right 09/21/2020  ? Procedure: PATELLA TENDON REPAIR;  Surgeon: Leandrew Koyanagi, MD;  Location: [facility];  Service: Orthopedics;  Laterality: Right;  ? POLYPECTOMY  01/21/2019  ? Procedure: POLYPECTOMY;  Surgeon: Milus Banister, MD;  Location: [facility];  Service: Endoscopy;;  ? RECTAL EXAM UNDER ANESTHESIA Left 08/23/2020  ?  Procedure: RECTAL EXAM UNDER ANESTHESIA;  Surgeon: Michael Boston, MD;  Location: [facility];  Service: General;  Laterality: Left;  ? ? ? ?Current Outpatient Medications  ?Medication Sig Dispense Refill  ? ACCU-CHEK GUIDE test strip Use as directed-patient needs an appt 100 each 1  ? Accu-Chek Softclix Lancets lancets Use as directed 100 each 1  ? albuterol (VENTOLIN HFA) 108 (90 Base) MCG/ACT inhaler Inhale 2 puffs into the lungs every 6 (six) hours as  needed for wheezing or shortness of breath. 8 g 0  ? benzonatate (TESSALON) 100 MG capsule Take 1 capsule (100 mg total) by mouth every 8 (eight) hours as needed for cough. 21 capsule 0  ? blood glucose meter kit and supplies KIT Dispense based on patient and insurance preference. Use up to four times daily as directed. 1 each 0  ? Budeson-Glycopyrrol-Formoterol (BREZTRI AEROSPHERE) 160-9-4.8 MCG/ACT AERO Inhale 2 puffs into the lungs 2 (two) times daily. 32.1 g 3  ? carvedilol (COREG) 6.25 MG tablet Take 6.25 mg by mouth 2 (two) times daily with a meal.    ? ELIQUIS 5 MG TABS tablet Take 1 tablet by mouth twice daily 180 tablet 3  ? ENTRESTO 24-26 MG TAKE 1 TABLET BY MOUTH IN THE MORNING AND AT BEDTIME 180 tablet 1  ? JARDIANCE 10 MG TABS tablet Take 1 tablet by mouth once daily 90 tablet 0  ? Multiple Vitamin (MULTIVITAMIN WITH MINERALS) TABS tablet Take 1 tablet by mouth daily.    ? omeprazole (PRILOSEC) 40 MG capsule Take 40 mg by mouth daily.    ? sildenafil (VIAGRA) 50 MG tablet TAKE 1 TABLET BY MOUTH ONCE DAILY AS NEEDED FOR ERECTILE DYSFUNCTION 10 tablet 0  ? simvastatin (ZOCOR) 40 MG tablet TAKE 1 TABLET BY MOUTH AT BEDTIME 90 tablet 1  ? Spacer/Aero-Holding Chambers (AEROCHAMBER PLUS) inhaler Use as instructed 1 each 0  ? spironolactone (ALDACTONE) 25 MG tablet Take 25 mg by mouth daily.     ? torsemide (DEMADEX) 20 MG tablet Take 2 tablets (40 mg total) by mouth daily. 180 tablet 3  ? ?No current facility-administered medications for this visit.  ? ? ?Allergies:   Patient has no known allergies.  ? ?Social History:  The patient  reports that he has quit smoking. His smoking use included cigarettes. He has a 1.75 pack-year smoking history. He has never used smokeless tobacco. He reports that he does not currently use alcohol. He reports current drug use. Frequency: 1.00 time per week. Drug: Marijuana.  ? ?Family History:  The patient's family history includes Bipolar disorder in his daughter; Cancer in his  maternal grandmother and maternal uncle; Diabetes in his daughter and niece; Heart failure (age of onset: 31) in his father; High blood pressure in his father; Hypertension in his mother; Lung cancer in an other family member; Osteoarthritis in his mother.  ? ? ?ROS:  Please see the history of present illness.   Otherwise, review of systems is positive for none.   All other systems are reviewed and negative.  ? ?PHYSICAL EXAM: ?VS:  BP (!) 88/52   Pulse (!) 53   Ht 6' (1.829 m)   Wt 214 lb 6.4 oz (97.3 kg)   SpO2 97%   BMI 29.08 kg/m?  , BMI Body mass index is 29.08 kg/m?. ?GEN: Well nourished, well developed, in no acute distress  ?HEENT: normal  ?Neck: no JVD, carotid bruits, or masses ?Cardiac: RRR; no murmurs, rubs, or gallops,no edema  ?Respiratory:  clear to auscultation bilaterally, normal work of breathing ?GI: soft, nontender, nondistended, + BS ?MS: no deformity or atrophy  ?Skin: warm and dry, device site well healed ?Neuro:  Strength and sensation are intact ?Psych: euthymic mood, full affect ? ?EKG:  EKG is not ordered today. ?Personal review of the ekg ordered 10/05/21 shows sinus rhythm, PVC ? ?Personal review of the device interrogation today. Results in Box  ? ?Recent Labs: ?05/30/2021: Hemoglobin 12.6; Platelets 159.0 ?11/30/2021: Pro B Natriuretic peptide (BNP) 943.0; TSH 1.59 ?01/03/2022: ALT 20; BUN 22; Creatinine, Ser 1.60; Potassium 3.7; Sodium 136  ? ? ?Lipid Panel  ?   ?Component Value Date/Time  ? CHOL 141 11/30/2021 1301  ? TRIG 82.0 11/30/2021 1301  ? HDL 48.90 11/30/2021 1301  ? CHOLHDL 3 11/30/2021 1301  ? VLDL 16.4 11/30/2021 1301  ? LDLCALC 76 11/30/2021 1301  ? [place] 92 02/11/2020 1641  ? LDLDIRECT 166.4 05/17/2010 0905  ? ? ? ?Wt Readings from Last 3 Encounters:  ?03/12/22 214 lb 6.4 oz (97.3 kg)  ?02/20/22 208 lb (94.3 kg)  ?01/28/22 213 lb (96.6 kg)  ?  ? ? ?Other studies Reviewed: ?Additional studies/ records that were reviewed today include: TTE 02/05/20 ?Review of the above  records today demonstrates:  ? 1. Left ventricular ejection fraction, by estimation, is 15-20%. The left  ?ventricle has severely decreased function. The left ventricle demonstrates  ?global hypokinesis.

## 2022-03-13 NOTE — Telephone Encounter (Signed)
Please advise on MyChart.  ?

## 2022-03-19 ENCOUNTER — Other Ambulatory Visit: Payer: Self-pay | Admitting: Family Medicine

## 2022-03-19 ENCOUNTER — Telehealth: Payer: Self-pay | Admitting: Internal Medicine

## 2022-03-20 NOTE — Telephone Encounter (Signed)
Spoke with Bruce Robertson from [place] who stated the Bi Pap order must state a max pressure of 20. Dr. Shearon Stalls would it be ok to change the order to state that?  ?

## 2022-03-20 NOTE — Telephone Encounter (Signed)
Ok that's fine we can try that.  ?

## 2022-03-21 NOTE — Telephone Encounter (Signed)
Called [employer] and spoke with Bella Kennedy letting her know the info per ND. Asked Bella Kennedy if she could refax the form to office as I did not see it in ND's papers and she said she would. She also said she would have Jeneen Rinks hand-deliver form to office in case the fax does not go through.

## 2022-04-04 DIAGNOSIS — E118 Type 2 diabetes mellitus with unspecified complications: Secondary | ICD-10-CM | POA: Insufficient documentation

## 2022-04-04 NOTE — Progress Notes (Unsigned)
  Cardiology Office Note   Date:  04/05/2022   ID:  Mylin Politte, DOB 07/07/1961, MRN [id number]  PCP:  Koberlein, Junell C, MD  Cardiologist:   James Hochrein, MD   Chief Complaint  Patient presents with   Cardiomyopathy       History of Present Illness: Bruce Robertson is a 61 y.o. 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  His course over the years has been difficult because of social issues, occasional inability to obtain meds and inconsistent follow up with appts.   He has been seen at [facility] by the Heart Failure Service.  I do see that his last cardiopulmonary stress test was in March.  Peak VO2 was 15.9 mL/kg/min to be moderate functional impairment.   He was recently seen by Dr. Camnitz and had his Coreg changed to metoprolol to see if that would help with some of his fatigue    However, he did not do well with this.  He felt more fatigued when he was taking metoprolol so he went back to carvedilol 625 mg twice daily.  This is lower than I thought he was on but his blood pressure would not tolerate more med titration.  He actually feels well.  Today he is in atrial flutter but he would not notice this.  He is not having any palpitations, presyncope or syncope.  He denies any chest pressure, neck or arm discomfort.  He had no weight gain or edema.  I did go back and look at his last device interrogation he was in fibrillation 6.5% of the time.  He had a little sharp discomfort behind his device for 2-1/2 hours a few days ago.  He took some baby aspirin and this went away.  He has not had any firings as far as he can feel.   Past Medical History:  Diagnosis Date   AICD (automatic cardioverter/defibrillator) present    Anxiety 08/20/2016   Barrett's esophagus 2002   Cardiomyopathy     Nonischemic. EF has been about 45%. S/P CABG.;  b.  Echo 4/14: EF 25%, global HK with inf and mid apical AK, restrictive physiology with E/e' > 15 (elevated LV filling pressure), trivial AI/MR, mild to mod LAE, mild RVE, mild reduced RVSF, mild RAE, mod TR, PASP 74 (severe pulmonary HTN)    CHF (congestive heart failure) (HCC)    CHF exacerbation (HCC) 07/31/2016   Colon polyps 2002   Coronary artery disease    Diabetes mellitus without complication (HCC)    Type II   Dyslipidemia    hx (03/20/2017)   GERD (gastroesophageal reflux disease)    History of bleeding peptic ulcer    History of echocardiogram    Echo 10/16:  EF 25-30%, poss non-compaction, diff HK with inf-lat and apical HK, restrictive physio, mild AI, severe LAE, mild RVE with mild reduced RVSF, PASP 42 mmHg   Hyperplastic rectal polyp    Moderate episode of recurrent major depressive disorder (HCC) 08/20/2016   09/14/20 - not current   OSA on CPAP    Mild   Pneumonia 09/2018   Tobacco abuse    Remote    Past Surgical History:  Procedure Laterality Date   CARDIAC CATHETERIZATION  2007;  2009   CARDIAC DEFIBRILLATOR PLACEMENT  03/20/2017   COLONOSCOPY WITH   PROPOFOL N/A 01/21/2019   Procedure: COLONOSCOPY WITH PROPOFOL;  Surgeon: Milus Banister, MD;  Location: [facility];  Service: Endoscopy;  Laterality: N/A;   CORONARY ARTERY BYPASS GRAFT  2007   Had a left main dissection after catheterization. Underwent a saphenous vein graft to the LAD and a saphenous vein graft to obtuse marginal.   HEMORRHOID SURGERY N/A 08/23/2020   Procedure: HEMORRHOIDECTOMY WITH LIGATION AND HEMORRHOIDOPEXY;  Surgeon: Michael Boston, MD;  Location: [facility];  Service: General;  Laterality: N/A;   ICD IMPLANT N/A 03/20/2017   Procedure: ICD Implant;  Surgeon: Constance Haw, MD;  Location: [place] [facility];  Service: Cardiovascular;  Laterality: N/A;   PATELLAR TENDON REPAIR Right 2005   PATELLAR TENDON REPAIR Right 09/21/2020    Procedure: PATELLA TENDON REPAIR;  Surgeon: Leandrew Koyanagi, MD;  Location: [place];  Service: Orthopedics;  Laterality: Right;   POLYPECTOMY  01/21/2019   Procedure: POLYPECTOMY;  Surgeon: Milus Banister, MD;  Location: [facility];  Service: Endoscopy;;   RECTAL EXAM UNDER ANESTHESIA Left 08/23/2020   Procedure: RECTAL EXAM UNDER ANESTHESIA;  Surgeon: Michael Boston, MD;  Location: [facility];  Service: General;  Laterality: Left;     Current Outpatient Medications  Medication Sig Dispense Refill   ACCU-CHEK GUIDE test strip Use as directed-patient needs an appt 100 each 1   Accu-Chek Softclix Lancets lancets Use as directed 100 each 1   albuterol (VENTOLIN HFA) 108 (90 Base) MCG/ACT inhaler Inhale 2 puffs into the lungs every 6 (six) hours as needed for wheezing or shortness of breath. 8 g 0   blood glucose meter kit and supplies KIT Dispense based on patient and insurance preference. Use up to four times daily as directed. 1 each 0   Budeson-Glycopyrrol-Formoterol (BREZTRI AEROSPHERE) 160-9-4.8 MCG/ACT AERO Inhale 2 puffs into the lungs 2 (two) times daily. 32.1 g 3   ELIQUIS 5 MG TABS tablet Take 1 tablet by mouth twice daily 180 tablet 3   ENTRESTO 24-26 MG TAKE 1 TABLET BY MOUTH IN THE MORNING AND AT BEDTIME 180 tablet 1   JARDIANCE 10 MG TABS tablet Take 1 tablet by mouth once daily 90 tablet 0   Multiple Vitamin (MULTIVITAMIN WITH MINERALS) TABS tablet Take 1 tablet by mouth daily.     omeprazole (PRILOSEC) 40 MG capsule Take 40 mg by mouth daily.     sildenafil (VIAGRA) 50 MG tablet TAKE 1 TABLET BY MOUTH ONCE DAILY AS NEEDED FOR ERECTILE DYSFUNCTION 10 tablet 0   simvastatin (ZOCOR) 40 MG tablet TAKE 1 TABLET BY MOUTH AT BEDTIME 90 tablet 1   Spacer/Aero-Holding Chambers (AEROCHAMBER PLUS) inhaler Use as instructed 1 each 0   spironolactone (ALDACTONE) 25 MG tablet Take 25 mg by mouth daily.      torsemide (DEMADEX) 20 MG tablet Take 2 tablets (40 mg total) by mouth daily. 180 tablet 3    benzonatate (TESSALON) 100 MG capsule Take 1 capsule (100 mg total) by mouth every 8 (eight) hours as needed for cough. (Patient not taking: Reported on 04/05/2022) 21 capsule 0   carvedilol (COREG) 6.25 MG tablet Take 1 tablet (6.25 mg total) by mouth 2 (two) times daily. 180 tablet 3   No current facility-administered medications for this visit.    Allergies:   Patient has no known allergies.     ROS:  Please see the history of present illness.   Otherwise, review of systems are positive for none.   All other systems are reviewed  and negative.    PHYSICAL EXAM: VS:  BP (!) 86/54 (BP Location: Left Arm, Patient Position: Sitting, Cuff Size: Large)   Pulse 75   Ht 6' (1.829 m)   Wt 210 lb 6.4 oz (95.4 kg)   SpO2 96%   BMI 28.54 kg/m  , BMI Body mass index is 28.54 kg/m. GENERAL:  Well appearing NECK:  No jugular venous distention, waveform within normal limits, carotid upstroke brisk and symmetric, no bruits, no thyromegaly LUNGS:  Clear to auscultation bilaterally CHEST: Well-healed ICD pocket HEART:  PMI not displaced or sustained,S1 and S2 within normal limits, no S3, no clicks, no rubs, no murmurs, irregular ABD:  Flat, positive bowel sounds normal in frequency in pitch, no bruits, no rebound, no guarding, no midline pulsatile mass, no hepatomegaly, no splenomegaly EXT:  2 plus pulses throughout, no edema, no cyanosis no clubbing   EKG:  EKG is   ordered today. Atrial flutter, rate 75, axis within normal limits, interventricular conduction delay, premature ventricular contractions, no acute ST-T wave changes.  Premature ventricular contractions  Recent Labs: 05/30/2021: Hemoglobin 12.6; Platelets 159.0 11/30/2021: Pro B Natriuretic peptide (BNP) 943.0; TSH 1.59 01/03/2022: ALT 20; BUN 22; Creatinine, Ser 1.60; Potassium 3.7; Sodium 136    Lipid Panel    Component Value Date/Time   CHOL 141 11/30/2021 1301   TRIG 82.0 11/30/2021 1301   HDL 48.90 11/30/2021 1301   CHOLHDL 3  11/30/2021 1301   VLDL 16.4 11/30/2021 1301   LDLCALC 76 11/30/2021 1301   LDLCALC 92 02/11/2020 1641   LDLDIRECT 166.4 05/17/2010 0905      Wt Readings from Last 3 Encounters:  04/05/22 210 lb 6.4 oz (95.4 kg)  03/12/22 214 lb 6.4 oz (97.3 kg)  02/20/22 208 lb (94.3 kg)      Other studies Reviewed: Additional studies/ records that were reviewed today include: Labs, [facility] notes Review of the above records demonstrates:  Please see elsewhere in the note.     Current medicines are reviewed at length with the patient today.  The patient does not have concerns regarding medicines.  The following changes have been made: None  Labs/ tests ordered today include: None  Orders Placed This Encounter  Procedures   EKG 12-Lead     ASSESSMENT AND PLAN:    CHRONIC SYSTOLIC AND DIASTOLIC HF:    He seems to be euvolemic.  I reviewed his cardiopulmonary stress test.   His blood  GI BLEEDING:   He has had no active bleeding.  His hemoglobin was low normal in March.  No change in therapy.   PAF: I am going to have his device interrogated in a couple of weeks to see if he is having more persistent PAF which might alter therapies.   ICD:  He is up to date with his ICD as above.   He is up-to-date as above.  OSA:     He is actually getting a new CPAP machine tomorrow.  S/p CABG:   He had 1 episode of sharp chest discomfort behind the defibrillator that sounds nonanginal.   He otherwise is having no chest discomfort and able to do physical activity.  He will let me know if he has any recurrent symptoms.   HLD: LDL was 76.  No change in therapy.  DM:    A1c was 7.0.  No change in therapy.  CKD stage 3:    Creat was 1.6.  We will this.    Signed, Minus Breeding, MD  04/05/2022 10:53 AM    [facility] 

## 2022-04-05 ENCOUNTER — Encounter: Payer: Self-pay | Admitting: Cardiology

## 2022-04-05 ENCOUNTER — Ambulatory Visit (INDEPENDENT_AMBULATORY_CARE_PROVIDER_SITE_OTHER): Payer: Medicare HMO | Admitting: Cardiology

## 2022-04-05 VITALS — BP 86/54 | HR 75 | Ht 72.0 in | Wt 210.4 lb

## 2022-04-05 DIAGNOSIS — Z4502 Encounter for adjustment and management of automatic implantable cardiac defibrillator: Secondary | ICD-10-CM

## 2022-04-05 DIAGNOSIS — I480 Paroxysmal atrial fibrillation: Secondary | ICD-10-CM | POA: Diagnosis not present

## 2022-04-05 DIAGNOSIS — N183 Chronic kidney disease, stage 3 unspecified: Secondary | ICD-10-CM

## 2022-04-05 DIAGNOSIS — E782 Mixed hyperlipidemia: Secondary | ICD-10-CM | POA: Diagnosis not present

## 2022-04-05 DIAGNOSIS — E118 Type 2 diabetes mellitus with unspecified complications: Secondary | ICD-10-CM | POA: Diagnosis not present

## 2022-04-05 DIAGNOSIS — I5042 Chronic combined systolic (congestive) and diastolic (congestive) heart failure: Secondary | ICD-10-CM | POA: Diagnosis not present

## 2022-04-05 MED ORDER — CARVEDILOL 6.25 MG PO TABS: 6.2500 mg | ORAL_TABLET | Freq: Two times a day (BID) | ORAL | 3 refills | Status: DC

## 2022-04-05 NOTE — Patient Instructions (Signed)
Medication Instructions:  Your physician recommends that you continue on your current medications as directed. Please refer to the Current Medication list given to you today.  *If you need a refill on your cardiac medications before your next appointment, please call your pharmacy*    Follow-Up: At [facility], you and your health needs are our priority.  As part of our continuing mission to provide you with exceptional heart care, we have created designated Provider Care Teams.  These Care Teams include your primary Cardiologist (physician) and Advanced Practice Providers (APPs -  Physician Assistants and Nurse Practitioners) who all work together to provide you with the care you need, when you need it.  We recommend signing up for the patient portal called "MyChart".  Sign up information is provided on this After Visit Summary.  MyChart is used to connect with patients for Virtual Visits (Telemedicine).  Patients are able to view lab/test results, encounter notes, upcoming appointments, etc.  Non-urgent messages can be sent to your provider as well.   To learn more about what you can do with MyChart, go to [email or web address].    Your next appointment:   6 month(s)  The format for your next appointment:   In Person  Provider:   Coletta Memos, FNP, Fabian Sharp, PA-C, Sande Rives, PA-C, Caron Presume, PA-C, Jory Sims, DNP, ANP, Almyra Deforest, PA-C, or Diona Browner, NP      Other Instructions We will do a device interrogation in 2 weeks.

## 2022-04-15 ENCOUNTER — Emergency Department (HOSPITAL_COMMUNITY): Payer: Medicare HMO

## 2022-04-15 ENCOUNTER — Inpatient Hospital Stay (HOSPITAL_COMMUNITY)
Admission: EM | Admit: 2022-04-15 | Discharge: 2022-04-18 | DRG: 291 | Disposition: A | Discharge status: Home / Self Care | Payer: Medicare HMO | Attending: Internal Medicine | Admitting: Internal Medicine

## 2022-04-15 ENCOUNTER — Encounter (HOSPITAL_COMMUNITY): Payer: Self-pay

## 2022-04-15 ENCOUNTER — Other Ambulatory Visit: Payer: Self-pay

## 2022-04-15 DIAGNOSIS — Z7951 Long term (current) use of inhaled steroids: Secondary | ICD-10-CM

## 2022-04-15 DIAGNOSIS — N1831 Chronic kidney disease, stage 3a: Secondary | ICD-10-CM | POA: Diagnosis present

## 2022-04-15 DIAGNOSIS — R0902 Hypoxemia: Secondary | ICD-10-CM | POA: Diagnosis not present

## 2022-04-15 DIAGNOSIS — K219 Gastro-esophageal reflux disease without esophagitis: Secondary | ICD-10-CM

## 2022-04-15 DIAGNOSIS — R0602 Shortness of breath: Secondary | ICD-10-CM | POA: Diagnosis not present

## 2022-04-15 DIAGNOSIS — I272 Pulmonary hypertension, unspecified: Secondary | ICD-10-CM | POA: Diagnosis present

## 2022-04-15 DIAGNOSIS — G4733 Obstructive sleep apnea (adult) (pediatric): Secondary | ICD-10-CM | POA: Diagnosis present

## 2022-04-15 DIAGNOSIS — R079 Chest pain, unspecified: Secondary | ICD-10-CM | POA: Diagnosis not present

## 2022-04-15 DIAGNOSIS — Z8249 Family history of ischemic heart disease and other diseases of the circulatory system: Secondary | ICD-10-CM

## 2022-04-15 DIAGNOSIS — I110 Hypertensive heart disease with heart failure: Secondary | ICD-10-CM | POA: Diagnosis not present

## 2022-04-15 DIAGNOSIS — Z9181 History of falling: Secondary | ICD-10-CM

## 2022-04-15 DIAGNOSIS — I5023 Acute on chronic systolic (congestive) heart failure: Secondary | ICD-10-CM | POA: Diagnosis not present

## 2022-04-15 DIAGNOSIS — I480 Paroxysmal atrial fibrillation: Secondary | ICD-10-CM | POA: Diagnosis not present

## 2022-04-15 DIAGNOSIS — Z8711 Personal history of peptic ulcer disease: Secondary | ICD-10-CM

## 2022-04-15 DIAGNOSIS — I420 Dilated cardiomyopathy: Secondary | ICD-10-CM | POA: Diagnosis present

## 2022-04-15 DIAGNOSIS — I5082 Biventricular heart failure: Secondary | ICD-10-CM | POA: Diagnosis not present

## 2022-04-15 DIAGNOSIS — I130 Hypertensive heart and chronic kidney disease with heart failure and stage 1 through stage 4 chronic kidney disease, or unspecified chronic kidney disease: Secondary | ICD-10-CM | POA: Diagnosis not present

## 2022-04-15 DIAGNOSIS — I509 Heart failure, unspecified: Principal | ICD-10-CM

## 2022-04-15 DIAGNOSIS — K2270 Barrett's esophagus without dysplasia: Secondary | ICD-10-CM | POA: Diagnosis present

## 2022-04-15 DIAGNOSIS — E1122 Type 2 diabetes mellitus with diabetic chronic kidney disease: Secondary | ICD-10-CM | POA: Diagnosis present

## 2022-04-15 DIAGNOSIS — J9601 Acute respiratory failure with hypoxia: Secondary | ICD-10-CM | POA: Diagnosis not present

## 2022-04-15 DIAGNOSIS — N183 Chronic kidney disease, stage 3 unspecified: Secondary | ICD-10-CM | POA: Diagnosis present

## 2022-04-15 DIAGNOSIS — I248 Other forms of acute ischemic heart disease: Secondary | ICD-10-CM | POA: Diagnosis present

## 2022-04-15 DIAGNOSIS — E871 Hypo-osmolality and hyponatremia: Secondary | ICD-10-CM | POA: Diagnosis not present

## 2022-04-15 DIAGNOSIS — Z79899 Other long term (current) drug therapy: Secondary | ICD-10-CM

## 2022-04-15 DIAGNOSIS — E861 Hypovolemia: Secondary | ICD-10-CM | POA: Diagnosis present

## 2022-04-15 DIAGNOSIS — I5043 Acute on chronic combined systolic (congestive) and diastolic (congestive) heart failure: Secondary | ICD-10-CM | POA: Diagnosis not present

## 2022-04-15 DIAGNOSIS — Z7901 Long term (current) use of anticoagulants: Secondary | ICD-10-CM

## 2022-04-15 DIAGNOSIS — F331 Major depressive disorder, recurrent, moderate: Secondary | ICD-10-CM | POA: Diagnosis present

## 2022-04-15 DIAGNOSIS — E785 Hyperlipidemia, unspecified: Secondary | ICD-10-CM | POA: Diagnosis present

## 2022-04-15 DIAGNOSIS — I959 Hypotension, unspecified: Secondary | ICD-10-CM | POA: Diagnosis not present

## 2022-04-15 DIAGNOSIS — I5031 Acute diastolic (congestive) heart failure: Secondary | ICD-10-CM | POA: Diagnosis not present

## 2022-04-15 DIAGNOSIS — Z8701 Personal history of pneumonia (recurrent): Secondary | ICD-10-CM

## 2022-04-15 DIAGNOSIS — R059 Cough, unspecified: Secondary | ICD-10-CM | POA: Diagnosis not present

## 2022-04-15 DIAGNOSIS — I2510 Atherosclerotic heart disease of native coronary artery without angina pectoris: Secondary | ICD-10-CM | POA: Diagnosis present

## 2022-04-15 DIAGNOSIS — R778 Other specified abnormalities of plasma proteins: Secondary | ICD-10-CM

## 2022-04-15 DIAGNOSIS — E118 Type 2 diabetes mellitus with unspecified complications: Secondary | ICD-10-CM | POA: Diagnosis present

## 2022-04-15 DIAGNOSIS — Z95810 Presence of automatic (implantable) cardiac defibrillator: Secondary | ICD-10-CM

## 2022-04-15 DIAGNOSIS — I428 Other cardiomyopathies: Secondary | ICD-10-CM | POA: Diagnosis not present

## 2022-04-15 DIAGNOSIS — Z833 Family history of diabetes mellitus: Secondary | ICD-10-CM

## 2022-04-15 DIAGNOSIS — Z7984 Long term (current) use of oral hypoglycemic drugs: Secondary | ICD-10-CM

## 2022-04-15 DIAGNOSIS — Z87891 Personal history of nicotine dependence: Secondary | ICD-10-CM

## 2022-04-15 DIAGNOSIS — Z951 Presence of aortocoronary bypass graft: Secondary | ICD-10-CM

## 2022-04-15 LAB — LACTIC ACID, PLASMA
Lactic Acid, Venous: 1.5 mmol/L (ref 0.5–1.9)
Lactic Acid, Venous: 1.4 mmol/L (ref 0.5–1.9)

## 2022-04-15 LAB — BASIC METABOLIC PANEL
Anion gap: 8 (ref 5–15)
BUN: 20 mg/dL (ref 6–20)
CO2: 28 mmol/L (ref 22–32)
Calcium: 9.1 mg/dL (ref 8.9–10.3)
Chloride: 98 mmol/L (ref 98–111)
Creatinine, Ser: 1.74 mg/dL — ABNORMAL HIGH (ref 0.61–1.24)
GFR, Estimated: 45 mL/min — ABNORMAL LOW (ref >60)
Glucose, Bld: 120 mg/dL — ABNORMAL HIGH (ref 70–99)
Potassium: 3.8 mmol/L (ref 3.5–5.1)
Sodium: 134 mmol/L — ABNORMAL LOW (ref 135–145)

## 2022-04-15 LAB — CBC WITH DIFFERENTIAL/PLATELET
Abs Immature Granulocytes: 0.01 10*3/uL (ref 0.00–0.07)
Basophils Absolute: 0 10*3/uL (ref 0.0–0.1)
Basophils Relative: 1 %
Eosinophils Absolute: 0.1 10*3/uL (ref 0.0–0.5)
Eosinophils Relative: 1 %
HCT: 46.6 % (ref 39.0–52.0)
Hemoglobin: 14.7 g/dL (ref 13.0–17.0)
Immature Granulocytes: 0 %
Lymphocytes Relative: 35 %
Lymphs Abs: 1.7 10*3/uL (ref 0.7–4.0)
MCH: 25.4 pg — ABNORMAL LOW (ref 26.0–34.0)
MCHC: 31.5 g/dL (ref 30.0–36.0)
MCV: 80.5 fL (ref 80.0–100.0)
Monocytes Absolute: 0.5 10*3/uL (ref 0.1–1.0)
Monocytes Relative: 9 %
Neutro Abs: 2.6 10*3/uL (ref 1.7–7.7)
Neutrophils Relative %: 54 %
Platelets: 158 10*3/uL (ref 150–400)
RBC: 5.79 MIL/uL (ref 4.22–5.81)
RDW: 16.2 % — ABNORMAL HIGH (ref 11.5–15.5)
WBC: 4.9 10*3/uL (ref 4.0–10.5)
nRBC: 0 % (ref 0.0–0.2)

## 2022-04-15 LAB — I-STAT ARTERIAL BLOOD GAS, ED
Acid-Base Excess: 3 mmol/L — ABNORMAL HIGH (ref 0.0–2.0)
Bicarbonate: 27.4 mmol/L (ref 20.0–28.0)
Calcium, Ion: 1.17 mmol/L (ref 1.15–1.40)
HCT: 47 % (ref 39.0–52.0)
Hemoglobin: 16 g/dL (ref 13.0–17.0)
O2 Saturation: 99 %
Patient temperature: 97.9
Potassium: 3.3 mmol/L — ABNORMAL LOW (ref 3.5–5.1)
Sodium: 135 mmol/L (ref 135–145)
TCO2: 29 mmol/L (ref 22–32)
pCO2 arterial: 38.1 mmHg (ref 32–48)
pH, Arterial: 7.462 — ABNORMAL HIGH (ref 7.35–7.45)
pO2, Arterial: 110 mmHg — ABNORMAL HIGH (ref 83–108)

## 2022-04-15 LAB — HIV ANTIBODY (ROUTINE TESTING W REFLEX): HIV Screen 4th Generation wRfx: NONREACTIVE

## 2022-04-15 LAB — GLUCOSE, CAPILLARY: Glucose-Capillary: 139 mg/dL — ABNORMAL HIGH (ref 70–99)

## 2022-04-15 LAB — BRAIN NATRIURETIC PEPTIDE: B Natriuretic Peptide: 1220.8 pg/mL — ABNORMAL HIGH (ref 0.0–100.0)

## 2022-04-15 LAB — HEMOGLOBIN A1C
Hgb A1c MFr Bld: 7 % — ABNORMAL HIGH (ref 4.8–5.6)
Mean Plasma Glucose: 154.2 mg/dL

## 2022-04-15 LAB — TROPONIN I (HIGH SENSITIVITY)
Troponin I (High Sensitivity): 27 ng/L — ABNORMAL HIGH (ref <18)
Troponin I (High Sensitivity): 23 ng/L — ABNORMAL HIGH (ref <18)

## 2022-04-15 MED ORDER — PANTOPRAZOLE SODIUM 40 MG PO TBEC
40.0000 mg | DELAYED_RELEASE_TABLET | Freq: Every day | ORAL | Status: DC
  Administered 2022-04-16 – 2022-04-18 (×3): 40 mg via ORAL
  Filled 2022-04-15 (×3): qty 1

## 2022-04-15 MED ORDER — SODIUM CHLORIDE 0.9% FLUSH
3.0000 mL | Freq: Two times a day (BID) | INTRAVENOUS | Status: DC
Start: 2022-04-15 — End: 2022-04-17
  Administered 2022-04-15 – 2022-04-17 (×4): 3 mL via INTRAVENOUS

## 2022-04-15 MED ORDER — ONDANSETRON HCL 4 MG/2ML IJ SOLN
4.0000 mg | Freq: Four times a day (QID) | INTRAMUSCULAR | Status: DC | PRN
  Administered 2022-04-17: 4 mg via INTRAVENOUS
  Filled 2022-04-15: qty 2

## 2022-04-15 MED ORDER — INSULIN ASPART 100 UNIT/ML IJ SOLN: 0.0000 [IU] | INTRAMUSCULAR | Status: DC

## 2022-04-15 MED ORDER — FUROSEMIDE 10 MG/ML IJ SOLN
20.0000 mg | Freq: Once | INTRAMUSCULAR | Status: AC
  Administered 2022-04-15: 20 mg via INTRAVENOUS
  Filled 2022-04-15: qty 2

## 2022-04-15 MED ORDER — BUDESON-GLYCOPYRROL-FORMOTEROL 160-9-4.8 MCG/ACT IN AERO: 2.0000 | INHALATION_SPRAY | Freq: Two times a day (BID) | RESPIRATORY_TRACT | Status: DC

## 2022-04-15 MED ORDER — ACETAMINOPHEN 325 MG PO TABS
650.0000 mg | ORAL_TABLET | ORAL | Status: DC | PRN
  Filled 2022-04-15: qty 2

## 2022-04-15 MED ORDER — DICLOFENAC SODIUM 1 % EX GEL
4.0000 g | Freq: Four times a day (QID) | CUTANEOUS | Status: DC
  Administered 2022-04-15 – 2022-04-18 (×6): 4 g via TOPICAL
  Filled 2022-04-15: qty 100

## 2022-04-15 MED ORDER — IPRATROPIUM-ALBUTEROL 0.5-2.5 (3) MG/3ML IN SOLN
3.0000 mL | Freq: Once | RESPIRATORY_TRACT | Status: AC
  Administered 2022-04-15: 3 mL via RESPIRATORY_TRACT
  Filled 2022-04-15: qty 3

## 2022-04-15 MED ORDER — FUROSEMIDE 10 MG/ML IJ SOLN
40.0000 mg | Freq: Two times a day (BID) | INTRAMUSCULAR | Status: DC
  Administered 2022-04-15 – 2022-04-17 (×4): 40 mg via INTRAVENOUS
  Filled 2022-04-15 (×4): qty 4

## 2022-04-15 MED ORDER — ONDANSETRON HCL 4 MG/2ML IJ SOLN
4.0000 mg | Freq: Once | INTRAMUSCULAR | Status: AC
  Administered 2022-04-15: 4 mg via INTRAVENOUS
  Filled 2022-04-15: qty 2

## 2022-04-15 MED ORDER — UMECLIDINIUM BROMIDE 62.5 MCG/ACT IN AEPB
1.0000 | INHALATION_SPRAY | Freq: Every day | RESPIRATORY_TRACT | Status: DC
  Administered 2022-04-16 – 2022-04-18 (×3): 1 via RESPIRATORY_TRACT
  Filled 2022-04-15: qty 7

## 2022-04-15 MED ORDER — FLUTICASONE FUROATE-VILANTEROL 200-25 MCG/ACT IN AEPB
1.0000 | INHALATION_SPRAY | Freq: Every day | RESPIRATORY_TRACT | Status: DC
  Administered 2022-04-16 – 2022-04-18 (×3): 1 via RESPIRATORY_TRACT
  Filled 2022-04-15: qty 28

## 2022-04-15 MED ORDER — APIXABAN 5 MG PO TABS
5.0000 mg | ORAL_TABLET | Freq: Two times a day (BID) | ORAL | Status: DC
  Administered 2022-04-15 – 2022-04-18 (×6): 5 mg via ORAL
  Filled 2022-04-15 (×6): qty 1

## 2022-04-15 MED ORDER — ASPIRIN 81 MG PO CHEW
324.0000 mg | CHEWABLE_TABLET | Freq: Once | ORAL | Status: AC
  Administered 2022-04-15: 324 mg via ORAL
  Filled 2022-04-15: qty 4

## 2022-04-15 MED ORDER — SODIUM CHLORIDE 0.9% FLUSH: 3.0000 mL | INTRAVENOUS | Status: DC | PRN

## 2022-04-15 MED ORDER — SODIUM CHLORIDE 0.9 % IV SOLN: 250.0000 mL | INTRAVENOUS | Status: DC | PRN

## 2022-04-15 MED ORDER — SIMVASTATIN 20 MG PO TABS
40.0000 mg | ORAL_TABLET | Freq: Every day | ORAL | Status: DC
  Administered 2022-04-15 – 2022-04-17 (×3): 40 mg via ORAL
  Filled 2022-04-15 (×3): qty 2

## 2022-04-15 NOTE — ED Provider Notes (Signed)
[facility] EMERGENCY DEPARTMENT Provider Note   CSN: [id number] Arrival date & time: 04/15/22  5993     History  Chief Complaint  Patient presents with   Shortness of Breath    Bruce Robertson is a 61 y.o. male.  Patient as above with significant medical history as below, including nonischemic cardiomyopathy, diabetes, MDD, OSA on CPAP, prior tobacco use, daily marijuana use who presents to the ED with complaint of dyspnea.  Patient reports over the past 2 weeks worsening dyspnea initially was only with exertion now at rest.  Patient reports the compliance with his torsemide and with NIPPV nightly.  He has been increasing his torsemide dose, taking it twice a day the past few days without significant improvement.  Still having progressive dyspnea.  Mild nonproductive cough.  No fevers or chills.  Mild chest tightness midsternal.  Nonradiating.  No home oxygen use other than NIPPV no sick contacts or recent travel.  He follows with Dr. [name] and with Dr. Posey Pronto at [facility] for cardiology     Past Medical History:  Diagnosis Date   AICD (automatic cardioverter/defibrillator) present    Anxiety 08/20/2016   Barrett's esophagus 2002   Cardiomyopathy    Nonischemic. EF has been about 45%. S/P CABG.;  b.  Echo 4/14: EF 25%, global HK with inf and mid apical AK, restrictive physiology with E/e' > 15 (elevated LV filling pressure), trivial AI/MR, mild to mod LAE, mild RVE, mild reduced RVSF, mild RAE, mod TR, PASP 74 (severe pulmonary HTN)    CHF (congestive heart failure) (HCC)    CHF exacerbation (HCC) 07/31/2016   Colon polyps 2002   Coronary artery disease    Diabetes mellitus without complication (HCC)    Type II   Dyslipidemia    hx (03/20/2017)   GERD (gastroesophageal reflux disease)    History of bleeding peptic ulcer    History of echocardiogram    Echo 10/16:  EF 25-30%, poss non-compaction, diff HK with inf-lat and apical HK, restrictive physio, mild AI,  severe LAE, mild RVE with mild reduced RVSF, PASP 42 mmHg   Hyperplastic rectal polyp    Moderate episode of recurrent major depressive disorder ([facility]) 08/20/2016   09/14/20 - not current   OSA on CPAP    Mild   Pneumonia 09/2018   Tobacco abuse    Remote    Past Surgical History:  Procedure Laterality Date   CARDIAC CATHETERIZATION  2007;  2009   CARDIAC DEFIBRILLATOR PLACEMENT  03/20/2017   COLONOSCOPY WITH PROPOFOL N/A 01/21/2019   Procedure: COLONOSCOPY WITH PROPOFOL;  Surgeon: Milus Banister, MD;  Location: [facility];  Service: Endoscopy;  Laterality: N/A;   CORONARY ARTERY BYPASS GRAFT  2007   Had a left main dissection after catheterization. Underwent a saphenous vein graft to the LAD and a saphenous vein graft to obtuse marginal.   HEMORRHOID SURGERY N/A 08/23/2020   Procedure: HEMORRHOIDECTOMY WITH LIGATION AND HEMORRHOIDOPEXY;  Surgeon: Michael Boston, MD;  Location: [facility];  Service: General;  Laterality: N/A;   ICD IMPLANT N/A 03/20/2017   Procedure: ICD Implant;  Surgeon: Constance Haw, MD;  Location: [place] [facility];  Service: Cardiovascular;  Laterality: N/A;   PATELLAR TENDON REPAIR Right 2005   PATELLAR TENDON REPAIR Right 09/21/2020   Procedure: PATELLA TENDON REPAIR;  Surgeon: Leandrew Koyanagi, MD;  Location: [place];  Service: Orthopedics;  Laterality: Right;   POLYPECTOMY  01/21/2019   Procedure: POLYPECTOMY;  Surgeon: Owens Loffler  P, MD;  Location: [facility];  Service: Endoscopy;;   RECTAL EXAM UNDER ANESTHESIA Left 08/23/2020   Procedure: RECTAL EXAM UNDER ANESTHESIA;  Surgeon: Michael Boston, MD;  Location: [facility];  Service: General;  Laterality: Left;     The history is provided by the patient. No language interpreter was used.  Shortness of Breath Associated symptoms: chest pain and cough   Associated symptoms: no abdominal pain, no fever, no headaches, no rash and no vomiting        Home Medications Prior to Admission medications    Medication Sig Start Date End Date Taking? Authorizing Provider  albuterol (VENTOLIN HFA) 108 (90 Base) MCG/ACT inhaler Inhale 2 puffs into the lungs every 6 (six) hours as needed for wheezing or shortness of breath. 11/30/21  Yes Koberlein, Junell C, MD  Budeson-Glycopyrrol-Formoterol (BREZTRI AEROSPHERE) 160-9-4.8 MCG/ACT AERO Inhale 2 puffs into the lungs 2 (two) times daily. 02/06/22  Yes Koberlein, Junell C, MD  carvedilol (COREG) 6.25 MG tablet Take 1 tablet (6.25 mg total) by mouth 2 (two) times daily. 04/05/22  Yes Hochrein, Jeneen Rinks, MD  ELIQUIS 5 MG TABS tablet Take 1 tablet by mouth twice daily 09/28/21  Yes Koberlein, Junell C, MD  ENTRESTO 24-26 MG TAKE 1 TABLET BY MOUTH IN THE MORNING AND AT BEDTIME 11/20/21  Yes Koberlein, Junell C, MD  JARDIANCE 10 MG TABS tablet Take 1 tablet by mouth once daily 03/19/22  Yes Koberlein, Junell C, MD  omeprazole (PRILOSEC) 40 MG capsule Take 40 mg by mouth daily. 11/19/21  Yes [provider]  sildenafil (VIAGRA) 50 MG tablet TAKE 1 TABLET BY MOUTH ONCE DAILY AS NEEDED FOR ERECTILE DYSFUNCTION 02/12/22  Yes Koberlein, Junell C, MD  simvastatin (ZOCOR) 40 MG tablet TAKE 1 TABLET BY MOUTH AT BEDTIME 12/18/21  Yes Minus Breeding, MD  Spacer/Aero-Holding Chambers (AEROCHAMBER PLUS) inhaler Use as instructed 05/25/21  Yes Koberlein, Junell C, MD  spironolactone (ALDACTONE) 25 MG tablet Take 25 mg by mouth daily.  03/30/20  Yes [provider]  torsemide (DEMADEX) 20 MG tablet Take 2 tablets (40 mg total) by mouth daily. 10/05/21  Yes Minus Breeding, MD  ACCU-CHEK GUIDE test strip Use as directed-patient needs an appt 07/18/21   Caren Macadam, MD  Accu-Chek Softclix Lancets lancets Use as directed 07/18/21   Caren Macadam, MD  blood glucose meter kit and supplies KIT Dispense based on patient and insurance preference. Use up to four times daily as directed. 02/14/20   Caren Macadam, MD      Allergies    Patient has no known  allergies.    Review of Systems   Review of Systems  Constitutional:  Negative for chills and fever.  HENT:  Negative for facial swelling and trouble swallowing.   Eyes:  Negative for photophobia and visual disturbance.  Respiratory:  Positive for cough, chest tightness and shortness of breath.   Cardiovascular:  Positive for chest pain. Negative for palpitations and leg swelling.  Gastrointestinal:  Negative for abdominal pain, nausea and vomiting.  Endocrine: Negative for polydipsia and polyuria.  Genitourinary:  Negative for difficulty urinating and hematuria.  Musculoskeletal:  Negative for gait problem and joint swelling.  Skin:  Negative for pallor and rash.  Neurological:  Negative for syncope and headaches.  Psychiatric/Behavioral:  Negative for agitation and confusion.     Physical Exam Updated Vital Signs BP 95/78   Pulse (!) 54   Temp 97.9 F (36.6 C) (Oral)   Resp 16  SpO2 100%  Physical Exam Vitals and nursing note reviewed.  Constitutional:      General: He is not in acute distress.    Appearance: He is well-developed. He is obese. He is not ill-appearing or diaphoretic.  HENT:     Head: Normocephalic and atraumatic.     Right Ear: External ear normal.     Left Ear: External ear normal.     Mouth/Throat:     Mouth: Mucous membranes are moist.  Eyes:     General: No scleral icterus. Cardiovascular:     Rate and Rhythm: Normal rate and regular rhythm.     Pulses: Normal pulses.     Heart sounds: Normal heart sounds.  Pulmonary:     Effort: Pulmonary effort is normal. No respiratory distress.     Breath sounds: Decreased breath sounds present.  Abdominal:     General: Abdomen is flat.     Palpations: Abdomen is soft.     Tenderness: There is no abdominal tenderness. There is no guarding or rebound.  Musculoskeletal:        General: Normal range of motion.     Cervical back: Normal range of motion.     Right lower leg: No edema.     Left lower leg: No  edema.  Skin:    General: Skin is warm and dry.     Capillary Refill: Capillary refill takes less than 2 seconds.     Coloration: Skin is not jaundiced.  Neurological:     Mental Status: He is alert and oriented to person, place, and time.  Psychiatric:        Mood and Affect: Mood normal.        Behavior: Behavior normal.     ED Results / Procedures / Treatments   Labs (all labs ordered are listed, but only abnormal results are displayed) Labs Reviewed  BASIC METABOLIC PANEL - Abnormal; Notable for the following components:      Result Value   Sodium 134 (*)    Glucose, Bld 120 (*)    Creatinine, Ser 1.74 (*)    GFR, Estimated 45 (*)    All other components within normal limits  BRAIN NATRIURETIC PEPTIDE - Abnormal; Notable for the following components:   B Natriuretic Peptide 1,220.8 (*)    All other components within normal limits  CBC WITH DIFFERENTIAL/PLATELET - Abnormal; Notable for the following components:   MCH 25.4 (*)    RDW 16.2 (*)    All other components within normal limits  HEMOGLOBIN A1C - Abnormal; Notable for the following components:   Hgb A1c MFr Bld 7.0 (*)    All other components within normal limits  TROPONIN I (HIGH SENSITIVITY) - Abnormal; Notable for the following components:   Troponin I (High Sensitivity) 27 (*)    All other components within normal limits  TROPONIN I (HIGH SENSITIVITY) - Abnormal; Notable for the following components:   Troponin I (High Sensitivity) 23 (*)    All other components within normal limits  LACTIC ACID, PLASMA  HIV ANTIBODY (ROUTINE TESTING W REFLEX)  LACTIC ACID, PLASMA    EKG EKG Interpretation  Date/Time:  Monday April 15 2022 09:19:23 EDT Ventricular Rate:  63 PR Interval:  160 QRS Duration: 104 QT Interval:  448 QTC Calculation: 458 R Axis:   100 Text Interpretation: Suspect arm lead reversal, interpretation assumes no reversal Sinus rhythm with occasional Premature ventricular complexes and Fusion  complexes with ventricular escape complexes Rightward axis Pulmonary  disease pattern T wave abnormality, consider inferior ischemia Abnormal ECG When compared with ECG of 24-May-2020 00:45, PREVIOUS ECG IS PRESENT Confirmed by Wynona Dove ([id number]) on 04/15/2022 11:30:23 AM  Radiology DG Chest 2 View  Result Date: 04/15/2022 CLINICAL DATA:  Worsening shortness of breath for 2 weeks with left of center chest pains. Cough producing brown mucus. EXAM: CHEST - 2 VIEW COMPARISON:  Chest two views 01/22/2022 FINDINGS: Left chest wall AICD with single lead again overlying the right ventricle unchanged. Status post median sternotomy and CABG. Cardiac silhouette is again mildly enlarged. Mediastinal contours are within normal limits. The lungs are clear. No pleural effusion or pneumothorax. Mild-to-moderate multilevel degenerative disc changes of the thoracic spine. IMPRESSION: No significant change from prior. Status post median sternotomy and CABG with mild cardiomegaly. No acute lung process. Electronically Signed   By: Yvonne Kendall M.D.   On: 04/15/2022 10:31    Procedures .Critical Care  Performed by: Jeanell Sparrow, DO Authorized by: Jeanell Sparrow, DO   Critical care provider statement:    Critical care time (minutes):  44   Critical care time was exclusive of:  Separately billable procedures and treating other patients   Critical care was necessary to treat or prevent imminent or life-threatening deterioration of the following conditions:  Respiratory failure and cardiac failure   Critical care was time spent personally by me on the following activities:  Development of treatment plan with patient or surrogate, discussions with consultants, evaluation of patient's response to treatment, examination of patient, ordering and review of laboratory studies, ordering and review of radiographic studies, ordering and performing treatments and interventions, pulse oximetry, re-evaluation of patient's condition,  review of old charts and obtaining history from patient or surrogate   Care discussed with: admitting provider       Medications Ordered in ED Medications  simvastatin (ZOCOR) tablet 40 mg (has no administration in time range)  apixaban (ELIQUIS) tablet 5 mg (has no administration in time range)  pantoprazole (PROTONIX) EC tablet 40 mg (has no administration in time range)  sodium chloride flush (NS) 0.9 % injection 3 mL (has no administration in time range)  sodium chloride flush (NS) 0.9 % injection 3 mL (has no administration in time range)  0.9 %  sodium chloride infusion (has no administration in time range)  acetaminophen (TYLENOL) tablet 650 mg (has no administration in time range)  ondansetron (ZOFRAN) injection 4 mg (has no administration in time range)  diclofenac Sodium (VOLTAREN) 1 % topical gel 4 g (has no administration in time range)  umeclidinium bromide (INCRUSE ELLIPTA) 62.5 MCG/ACT 1 puff (1 puff Inhalation Not Given 04/15/22 1441)  fluticasone furoate-vilanterol (BREO ELLIPTA) 200-25 MCG/ACT 1 puff (1 puff Inhalation Not Given 04/15/22 1441)  ipratropium-albuterol (DUONEB) 0.5-2.5 (3) MG/3ML nebulizer solution 3 mL (3 mLs Nebulization Given 04/15/22 1152)  aspirin chewable tablet 324 mg (324 mg Oral Given 04/15/22 1151)  ondansetron (ZOFRAN) injection 4 mg (4 mg Intravenous Given 04/15/22 1158)  furosemide (LASIX) injection 20 mg (20 mg Intravenous Given 04/15/22 1302)    ED Course/ Medical Decision Making/ A&P Clinical Course as of 04/15/22 1548  Mon Apr 15, 2022  1226 Patient became very dyspneic with ambulation, heart rate increase, conversational dyspnea with 2 word phrases.  Patient had to sit down to rest after walking 5-10 steps.  Patient is hypotensive, appears similar to prior blood pressure around a month ago but would recommend gentle diuresis given low blood pressure. [SG]  1240 DIB worsened,  placed on 4L [place] without much improvement; will trial BIPAP. Also give  lasix [SG]    Clinical Course User Index [SG] Jeanell Sparrow, DO                           Medical Decision Making Amount and/or Complexity of Data Reviewed Labs: ordered. ECG/medicine tests: ordered.  Risk OTC drugs. Prescription drug management. Decision regarding hospitalization.    CC: Dyspnea  This patient presents to the Emergency Department for the above complaint. This involves an extensive number of treatment options and is a complaint that carries with it a high risk of complications and morbidity. Vital signs were reviewed. Serious etiologies considered.  In my evaluation of this patient's dyspnea my DDx includes, but is not limited to, pneumonia, pulmonary embolism, pneumothorax, pulmonary edema, metabolic acidosis, asthma, COPD, cardiac cause, anemia, anxiety, etc.    Record review:  Previous records obtained and reviewed  Prior office visits, prior ED visits, prior urgent care visits, prior labs and imaging, home medications  Additional history obtained from spouse  Medical and surgical history as noted above.   Work up as above, notable for:  Labs & imaging results that were available during my care of the patient were visualized by me and considered in my medical decision making.   I ordered imaging studies which included chest x-ray.. I visualized the imaging, interpreted images, and I agree with radiologist interpretation.  Cardiomegaly  Cardiac monitoring reviewed and interpreted personally which shows sinus bradycardia  Labs reviewed, troponin mildly elevated 27, will get delta.  Sodium is mildly depleted, 134.  BNP is 1220 Creatinine is similar to his baseline, he has CKD.  Creatinine today is 1.74.   Concern for CHF exacerbation. Favor demand ischemia   Management: We will give nebulized breathing treatment, check pulse ox while ambulating.  Blood pressure is low, will hold off on torsemide or diuresis at this time given lack of hypoxia.  Will  ambulate to assess for hypoxia. >> Per chart review patient's blood pressure is often on the lower side, blood pressure at last cardiologist office visit was 86/54, prior to that 88/52. His carvedilol was recently adjusted to metoprolol 2/2 hypotension but pt unable to tolerate metoprolol so he resumed his carvedilol.   Give lasix 37m IV x1, he is on home torsemide   Reassessment:  Respiratory status improved on BIPAP, will continue this for now.   Admission was considered.   Patient follows with Dr. CCurt BearsEP, Dr. HPercival Spanishcardiology >> Per last office visit patient was switched from carvedilol to metoprolol secondary to low blood pressure.  He is on Eliquis.  Will admit given CHF exacerbation and oxygen requirement, respiratory distress. Cardiology to see on consult.           Social determinants of health include -  Social History   Socioeconomic History   Marital status: Divorced    Spouse name: Not on file   Number of children: 5   Years of education: Not on file   Highest education level: Not on file  Occupational History   Occupation: MCivil engineer, contracting   Employer: [employer] Tobacco Use   Smoking status: Former    Packs/day: 0.25    Years: 7.00    Total pack years: 1.75    Types: Cigarettes   Smokeless tobacco: Never   Tobacco comments:    03/20/2017 "quit in 1997  Vaping Use   Vaping Use:  Former   Devices: vaped for about a month  Substance and Sexual Activity   Alcohol use: Not Currently    Alcohol/week: 0.0 standard drinks of alcohol    Comment: twice a month no preference   Drug use: Yes    Frequency: 1.0 times per week    Types: Marijuana    Comment: last time 09/15/20   Sexual activity: Yes  Other Topics Concern   Not on file  Social History Narrative   Single   Social Determinants of Health   Financial Resource Strain: Low Risk  (01/29/2022)   Overall Financial Resource Strain (CARDIA)    Difficulty of Paying Living Expenses: Not very  hard  Food Insecurity: No Food Insecurity (11/20/2021)   Hunger Vital Sign    Worried About Running Out of Food in the Last Year: Never true    Ran Out of Food in the Last Year: Never true  Transportation Needs: No Transportation Needs (01/29/2022)   PRAPARE - Hydrologist (Medical): No    Lack of Transportation (Non-Medical): No  Physical Activity: Inactive (11/20/2021)   Exercise Vital Sign    Days of Exercise per Week: 0 days    Minutes of Exercise per Session: 0 min  Stress: No Stress Concern Present (11/20/2021)   [place]    Feeling of Stress : Only a little  Social Connections: Moderately Isolated (11/14/2020)   Social Connection and Isolation Panel [NHANES]    Frequency of Communication with Friends and Family: More than three times a week    Frequency of Social Gatherings with Friends and Family: Never    Attends Religious Services: More than 4 times per year    Active Member of [employer] or Organizations: No    Attends Archivist Meetings: Never    Marital Status: Divorced  Human resources officer Violence: Not At Risk (11/14/2020)   Humiliation, Afraid, Rape, and Kick questionnaire    Fear of Current or Ex-Partner: No    Emotionally Abused: No    Physically Abused: No    Sexually Abused: No      This chart was dictated using voice recognition software.  Despite best efforts to proofread,  errors can occur which can change the documentation meaning.         Final Clinical Impression(s) / ED Diagnoses Final diagnoses:  Acute on chronic congestive heart failure, unspecified heart failure type ([place])  Hypoxia  Elevated troponin    Rx / DC Orders ED Discharge Orders     None         Jeanell Sparrow, DO 04/15/22 1548

## 2022-04-15 NOTE — ED Notes (Signed)
Prior to ambulating patient stood up and had to sit back down to regain composure. Pt stated his vision felt blurry and he believed he stood up too fast. After about 2 minutes, pt stood up and began ambulating. Pt ambulated roughly 5f before expressing he was feeling light-headed and dizzy. Pt sat for about 1 minute before standing and returning to room. SpO2 range was 99%-100%. Upon entering room pt expressed discomfort and SOB. Pt appeared to have difficulty breathing and sat on the side of the bed until his respirations decreased. BP was rechecked- 97/52 and respirations were 21 BPM.

## 2022-04-15 NOTE — Consult Note (Addendum)
Cardiology Consultation:   Patient ID: Joandy Burget MRN: [id number]; DOB: Jun 19, 1961  Admit date: 04/15/2022 Date of Consult: 04/15/2022  PCP:  Caren Macadam, MD (Inactive)   [facility] Providers Cardiologist:  Minus Breeding, MD  Electrophysiologist:  Will [name], MD  {  Patient Profile:   Theordore Cisnero is a 61 y.o. male with a hx of nonischemic cardiomyopathy (EF 15%), status post CABG '07 due to diagnostic cath complicated by left main dissection, status post ICD, paroxysmal atrial fibrillation, Barrett's esophagus, dyslipidemia, OSA and GI bleeding who is being seen 04/15/2022 for the evaluation of CHF at the request of Dr. Pearline Cables.  History of Present Illness:   Mr. Nunziato is a 61 year old male with past medical history noted above.  He is followed by Dr. [name] as well as [facility] heart failure service.  He has history of CABG in 2007 due to diagnostic cath complication of left main dissection (SVG to LAD, SVG to OM).  EF at that time dropped to 20 to 25%.  He is also followed by Dr. Curt Bears and was found to have atrial fibrillation on device interrogation.  Echo showed LVEF of 15 to 20%, severe TR and moderate RV dysfunction.  He was referred to [facility] heart failure team.  Has had social issues and compliance in the past.  He was hospitalized 05/2020 with bright red bleeding per rectum.  He was diagnosed with hemorrhoids and continued on aspirin and Eliquis.  He had a cardiopulmonary stress test in March with peak VO2 of 15.22m/kg/min correlating with moderate functional impairment.  He was seen by Dr. CCurt Bearsand had his Coreg changed to metoprolol to see if this would help with his complaints of fatigue.  However he did not do well with this.  Became more fatigued with taking metoprolol so was switched back to carvedilol.  Last office visit was 04/05/2022 and noted to be in atrial flutter.  Denied any chest pain or palpitations.  Device interrogation at that time  showed atrial fibrillation 6.5% of the time.  Plan was to have his device interrogation repeated in a couple weeks to see if he was having more persistent paroxysmal atrial fibrillation which might alter his therapies.  Did note 1 episode of sharp chest pain behind his defibrillator that sounded non-anginal, but otherwise no chest discomfort.  Presented the ED with complaints of 2 weeks of shortness of breath.  Reports he has been compliant with his medications.  States his urine output seems to have decreased, despite taking his meds.  Thinks that his dry weight is around 205 pounds but family at the bedside states he has been closer to 197 most recently.  Weights at home have been around 210 to 212 pounds.  Reports some brief episodes of chest discomfort behind his ICD.  States he has has been drinking more soda recently, fluid intake has been up.  In the ED labs showed sodium 134, potassium 3.8, creatinine 1.7, BNP 1220, high-sensitivity troponin 27>>23, WBC 4.9, hemoglobin 14.7.  Chest x-ray with no edema.  EKG showed sinus rhythm, 63 bpm, PVCs.  His systolic blood pressures are in the 80-90 range.  He attempted to ambulate, but did not tolerate well and ultimately placed on BiPAP.  He was given IV Lasix 20 mg in the ED, 325 cc of urine output documented with full urinal at bedside.  Internal medicine to admit, cardiology consulted to assist with heart failure.  Past Medical History:  Diagnosis Date   AICD (automatic cardioverter/defibrillator)  present    Anxiety 08/20/2016   Barrett's esophagus 2002   Cardiomyopathy    Nonischemic. EF has been about 45%. S/P CABG.;  b.  Echo 4/14: EF 25%, global HK with inf and mid apical AK, restrictive physiology with E/e' > 15 (elevated LV filling pressure), trivial AI/MR, mild to mod LAE, mild RVE, mild reduced RVSF, mild RAE, mod TR, PASP 74 (severe pulmonary HTN)    CHF (congestive heart failure) (HCC)    CHF exacerbation (HCC) 07/31/2016   Colon polyps  2002   Coronary artery disease    Diabetes mellitus without complication (HCC)    Type II   Dyslipidemia    hx (03/20/2017)   GERD (gastroesophageal reflux disease)    History of bleeding peptic ulcer    History of echocardiogram    Echo 10/16:  EF 25-30%, poss non-compaction, diff HK with inf-lat and apical HK, restrictive physio, mild AI, severe LAE, mild RVE with mild reduced RVSF, PASP 42 mmHg   Hyperplastic rectal polyp    Moderate episode of recurrent major depressive disorder ([place]) 08/20/2016   09/14/20 - not current   OSA on CPAP    Mild   Pneumonia 09/2018   Tobacco abuse    Remote    Past Surgical History:  Procedure Laterality Date   CARDIAC CATHETERIZATION  2007;  2009   CARDIAC DEFIBRILLATOR PLACEMENT  03/20/2017   COLONOSCOPY WITH PROPOFOL N/A 01/21/2019   Procedure: COLONOSCOPY WITH PROPOFOL;  Surgeon: Milus Banister, MD;  Location: [facility];  Service: Endoscopy;  Laterality: N/A;   CORONARY ARTERY BYPASS GRAFT  2007   Had a left main dissection after catheterization. Underwent a saphenous vein graft to the LAD and a saphenous vein graft to obtuse marginal.   HEMORRHOID SURGERY N/A 08/23/2020   Procedure: HEMORRHOIDECTOMY WITH LIGATION AND HEMORRHOIDOPEXY;  Surgeon: Michael Boston, MD;  Location: [facility];  Service: General;  Laterality: N/A;   ICD IMPLANT N/A 03/20/2017   Procedure: ICD Implant;  Surgeon: Constance Haw, MD;  Location: [place] [facility];  Service: Cardiovascular;  Laterality: N/A;   PATELLAR TENDON REPAIR Right 2005   PATELLAR TENDON REPAIR Right 09/21/2020   Procedure: PATELLA TENDON REPAIR;  Surgeon: Leandrew Koyanagi, MD;  Location: [facility];  Service: Orthopedics;  Laterality: Right;   POLYPECTOMY  01/21/2019   Procedure: POLYPECTOMY;  Surgeon: Milus Banister, MD;  Location: [facility];  Service: Endoscopy;;   RECTAL EXAM UNDER ANESTHESIA Left 08/23/2020   Procedure: RECTAL EXAM UNDER ANESTHESIA;  Surgeon: Michael Boston, MD;  Location: [facility];  Service: General;  Laterality: Left;     Home Medications:  Prior to Admission medications   Medication Sig Start Date End Date Taking? Authorizing Provider  albuterol (VENTOLIN HFA) 108 (90 Base) MCG/ACT inhaler Inhale 2 puffs into the lungs every 6 (six) hours as needed for wheezing or shortness of breath. 11/30/21  Yes Koberlein, Junell C, MD  Budeson-Glycopyrrol-Formoterol (BREZTRI AEROSPHERE) 160-9-4.8 MCG/ACT AERO Inhale 2 puffs into the lungs 2 (two) times daily. 02/06/22  Yes Koberlein, Junell C, MD  carvedilol (COREG) 6.25 MG tablet Take 1 tablet (6.25 mg total) by mouth 2 (two) times daily. 04/05/22  Yes Hochrein, Jeneen Rinks, MD  ELIQUIS 5 MG TABS tablet Take 1 tablet by mouth twice daily 09/28/21  Yes Koberlein, Junell C, MD  ENTRESTO 24-26 MG TAKE 1 TABLET BY MOUTH IN THE MORNING AND AT BEDTIME 11/20/21  Yes Koberlein, Junell C, MD  JARDIANCE 10 MG TABS tablet Take  1 tablet by mouth once daily 03/19/22  Yes Koberlein, Junell C, MD  omeprazole (PRILOSEC) 40 MG capsule Take 40 mg by mouth daily. 11/19/21  Yes [provider]  sildenafil (VIAGRA) 50 MG tablet TAKE 1 TABLET BY MOUTH ONCE DAILY AS NEEDED FOR ERECTILE DYSFUNCTION 02/12/22  Yes Koberlein, Junell C, MD  simvastatin (ZOCOR) 40 MG tablet TAKE 1 TABLET BY MOUTH AT BEDTIME 12/18/21  Yes Minus Breeding, MD  Spacer/Aero-Holding Chambers (AEROCHAMBER PLUS) inhaler Use as instructed 05/25/21  Yes Koberlein, Junell C, MD  spironolactone (ALDACTONE) 25 MG tablet Take 25 mg by mouth daily.  03/30/20  Yes [provider]  torsemide (DEMADEX) 20 MG tablet Take 2 tablets (40 mg total) by mouth daily. 10/05/21  Yes Minus Breeding, MD  ACCU-CHEK GUIDE test strip Use as directed-patient needs an appt 07/18/21   Caren Macadam, MD  Accu-Chek Softclix Lancets lancets Use as directed 07/18/21   Caren Macadam, MD  blood glucose meter kit and supplies KIT Dispense based on patient and insurance preference. Use up to four times  daily as directed. 02/14/20   Caren Macadam, MD    Inpatient Medications: Scheduled Meds:  Continuous Infusions:  PRN Meds:   Allergies:   No Known Allergies  Social History:   Social History   Socioeconomic History   Marital status: Divorced    Spouse name: Not on file   Number of children: 5   Years of education: Not on file   Highest education level: Not on file  Occupational History   Occupation: Civil engineer, contracting    Employer: [employer] operational [employer]  Tobacco Use   Smoking status: Former    Packs/day: 0.25    Years: 7.00    Total pack years: 1.75    Types: Cigarettes   Smokeless tobacco: Never   Tobacco comments:    03/20/2017 "quit in [place] Use   Vaping Use: Former   Devices: vaped for about a month  Substance and Sexual Activity   Alcohol use: Not Currently    Alcohol/week: 0.0 standard drinks of alcohol    Comment: twice a month no preference   Drug use: Yes    Frequency: 1.0 times per week    Types: Marijuana    Comment: last time 09/15/20   Sexual activity: Yes  Other Topics Concern   Not on file  Social History Narrative   Single   Social Determinants of Health   Financial Resource Strain: Low Risk  (01/29/2022)   Overall Financial Resource Strain (CARDIA)    Difficulty of Paying Living Expenses: Not very hard  Food Insecurity: No Food Insecurity (11/20/2021)   Hunger Vital Sign    Worried About Running Out of Food in the Last Year: Never true    [place] in the Last Year: Never true  Transportation Needs: No Transportation Needs (01/29/2022)   PRAPARE - Hydrologist (Medical): No    Lack of Transportation (Non-Medical): No  Physical Activity: Inactive (11/20/2021)   Exercise Vital Sign    Days of Exercise per Week: 0 days    Minutes of Exercise per Session: 0 min  Stress: No Stress Concern Present (11/20/2021)   [place]    Feeling of  Stress : Only a little  Social Connections: Moderately Isolated (11/14/2020)   Social Connection and Isolation Panel [NHANES]    Frequency of Communication with Friends and Family: More than three  times a week    Frequency of Social Gatherings with Friends and Family: Never    Attends Religious Services: More than 4 times per year    Active Member of [employer] or Organizations: No    Attends Archivist Meetings: Never    Marital Status: Divorced  Human resources officer Violence: Not At Risk (11/14/2020)   Humiliation, Afraid, Rape, and Kick questionnaire    Fear of Current or Ex-Partner: No    Emotionally Abused: No    Physically Abused: No    Sexually Abused: No    Family History:    Family History  Problem Relation Age of Onset   Hypertension Mother    Osteoarthritis Mother    Heart failure Father 32   High blood pressure Father    Bipolar disorder Daughter    Diabetes Daughter    Cancer Maternal Uncle        uncertain type   Cancer Maternal Grandmother        uncertain type   Diabetes Niece    Lung cancer Other    Asthma Neg Hx    Breast cancer Neg Hx      ROS:  Please see the history of present illness.   All other ROS reviewed and negative.     Physical Exam/Data:   Vitals:   04/15/22 1228 04/15/22 1249 04/15/22 1300 04/15/22 1345  BP:   95/78   Pulse:  66 (!) 54   Resp:  17 16   Temp:      TempSrc:      SpO2: 100% 100% 100% 100%    Intake/Output Summary (Last 24 hours) at 04/15/2022 1457 Last data filed at 04/15/2022 1445 Gross per 24 hour  Intake --  Output 1075 ml  Net -1075 ml      04/05/2022   10:23 AM 03/12/2022    2:24 PM 02/20/2022   11:04 PM  Last 3 Weights  Weight (lbs) 210 lb 6.4 oz 214 lb 6.4 oz 208 lb  Weight (kg) 95.437 kg 97.251 kg 94.348 kg     There is no height or weight on file to calculate BMI.  General:  Well nourished, well developed, wearing BiPAP HEENT: normal Neck: + JVD to jaw bilaterally Vascular: No carotid bruits;  Distal pulses 2+ bilaterally Cardiac:  normal S1, S2; RRR; + systolic murmur at apex Lungs:  clear to auscultation bilaterally, no wheezing, rhonchi or rales  Abd: soft, nontender, no hepatomegaly  Ext: no edema Musculoskeletal:  No deformities, BUE and BLE strength normal and equal Skin: warm and dry  Neuro:  CNs 2-12 intact, no focal abnormalities noted Psych:  Normal affect   EKG:  The EKG was personally reviewed and demonstrates: sinus rhythm, 63 bpm, PVCs  Telemetry:  Telemetry was personally reviewed and demonstrates: Sinus rhythm, PVCs, short run nonsustained VT  Relevant CV Studies:  Echo: 02/2020  IMPRESSIONS    1. Left ventricular ejection fraction, by estimation, is 15-20%. The left  ventricle has severely decreased function. The left ventricle demonstrates  global hypokinesis. The left ventricular internal cavity size was severely  dilated. Left ventricular  diastolic parameters are indeterminate. Elevated left ventricular  end-diastolic pressure.   2. Right ventricular systolic function is moderately reduced. The right  ventricular size is moderately enlarged. There is severely elevated  pulmonary artery systolic pressure. The estimated right ventricular  systolic pressure is 14.4 mmHg.   3. Left atrial size was moderately dilated.   4. Right atrial size was moderate-severely  dilated.   5. The mitral valve is degenerative. Mild mitral valve regurgitation. No  evidence of mitral stenosis.   6. Tricuspid valve regurgitation is severe.   7. The aortic valve is abnormal. Aortic valve regurgitation is mild. No  aortic stenosis is present.   8. The inferior vena cava is dilated in size with <50% respiratory  variability, suggesting right atrial pressure of 15 mmHg.   Comparison(s): A prior study was performed on 04/01/2018. Prior images  reviewed side by side. Changes from prior study are noted. LVEF has  decreased. TR has increased.   Conclusion(s)/Recommendation(s):  No LV apical thrombus with Definity  contrast.   FINDINGS   Left Ventricle: Thinning of the basal septum could be due to a  granulomatous process. Left ventricular ejection fraction, by estimation,  is 15-20%. The left ventricle has severely decreased function. The left  ventricle demonstrates global hypokinesis.  The left ventricular internal cavity size was severely dilated. There is  no left ventricular hypertrophy. Left ventricular diastolic parameters are  indeterminate. Elevated left ventricular end-diastolic pressure.   Right Ventricle: The right ventricular size is moderately enlarged. No  increase in right ventricular wall thickness. Right ventricular systolic  function is moderately reduced. There is severely elevated pulmonary  artery systolic pressure. The tricuspid  regurgitant velocity is 3.69 m/s, and with an assumed right atrial  pressure of 15 mmHg, the estimated right ventricular systolic pressure is  50.3 mmHg.   Left Atrium: Left atrial size was moderately dilated.   Right Atrium: Right atrial size was moderate-severely dilated.   Pericardium: There is no evidence of pericardial effusion.   Mitral Valve: The mitral valve is degenerative in appearance. Normal  mobility of the mitral valve leaflets. Mild mitral valve regurgitation. No  evidence of mitral valve stenosis.   Tricuspid Valve: The tricuspid valve is normal in structure. Tricuspid  valve regurgitation is severe. No evidence of tricuspid stenosis.   Aortic Valve: The aortic valve is abnormal. . There is mild thickening and  mild calcification of the aortic valve. Aortic valve regurgitation is  mild. Aortic regurgitation PHT measures 560 msec. No aortic stenosis is  present. There is mild thickening of  the aortic valve. There is mild calcification of the aortic valve.   Pulmonic Valve: The pulmonic valve was normal in structure. Pulmonic valve  regurgitation is trivial. No evidence of pulmonic  stenosis.   Aorta: The aortic root is normal in size and structure.   Venous: The inferior vena cava is dilated in size with less than 50%  respiratory variability, suggesting right atrial pressure of 15 mmHg.   IAS/Shunts: No atrial level shunt detected by color flow Doppler.   Additional Comments: A pacer wire is visualized in the right ventricle and  right atrium.   Echo: 01/2022 ([facility])  AORTIC ROOT          Size: Normal    Dissection: No dissection   AORTIC VALVE      Leaflets: Tricuspid             Morphology: Normal      Mobility: Fully Mobile   LEFT VENTRICLE                                      Anterior: AKINETIC          Size: SEVERELY ENLARGED  Lateral: HYPOCONTRACTILE   Contraction: SEVERE GLOBAL DECREASE                  Septal: HYPOCONTRACTILE    Closest EF: <15%(Estimated)  Calc.EF: 13% (2D)      Apical: AKINETIC     LV masses: No Masses                             Inferior: AKINETIC           LVH: None                                 Posterior: HYPOCONTRACTILE  Dias.FxClass: RESTRICTIVE FILLING PATTERN (GRADE 3) CORRESPONDS TO REVERSIBLE                RESTRICTIVE PATTERN   MITRAL VALVE      Leaflets: Normal                  Mobility: Fully mobile    Morphology: Normal   LEFT ATRIUM          Size: MODERATELY ENLARGED     LA masses: No masses                Normal IAS   MAIN PA          Size: Normal   PULMONIC VALVE    Morphology: Normal      Mobility: Fully Mobile   RIGHT VENTRICLE          Size: MODERATELY ENLARGED       Free wall: HYPOCONTRACTILE   Contraction: MOD GLOBAL DECREASE       RV masses: No Masses   TRICUSPID VALVE      Leaflets: Normal                  Mobility: Fully mobile    Morphology: Normal   RIGHT ATRIUM          Size: MODERATELY ENLARGED        RA Other: None     RA masses: CATHETER IN RA   PERICARDIUM         Fluid: No effusion   INFERIOR VENACAVA          Size: Normal     ABNORMAL  RESPIRATORY COLLAPSE   DOPPLER ECHO and OTHER SPECIAL PROCEDURES ------------------------------------     Aortic: MILD AR                No AS      Mitral: MILD MR                No MS     MV Inflow E Vel.= 124.0 cm/s  MV Annulus E'Vel.= 4.0 cm/s  E/E'Ratio= 31   Tricuspid: MODERATE TR            No TS             3.1 m/s peak TR vel   47 mmHg peak RV pressure   Pulmonary: MILD PR                No PS       Other:             DEFINITY CONTRAST SHOWS ENHANCED LV BORDERS   INTERPRETATION ---------------------------------------------------------------    SEVERE LV DYSFUNCTION (See above)    ELEVATED LA PRESSURES WITH DIASTOLIC DYSFUNCTION  MODERATE RV SYSTOLIC DYSFUNCTION (See above)    VALVULAR REGURGITATION: MILD AR, MILD MR, MILD PR, MODERATE TR    NO VALVULAR STENOSIS    Laboratory Data:  High Sensitivity Troponin:   Recent Labs  Lab 04/15/22 0933 04/15/22 1128  TROPONINIHS 27* 23*     Chemistry Recent Labs  Lab 04/15/22 0933  NA 134*  K 3.8  CL 98  CO2 28  GLUCOSE 120*  BUN 20  CREATININE 1.74*  CALCIUM 9.1  GFRNONAA 45*  ANIONGAP 8    No results for input(s): "PROT", "ALBUMIN", "AST", "ALT", "ALKPHOS", "BILITOT" in the last 168 hours. Lipids No results for input(s): "CHOL", "TRIG", "HDL", "LABVLDL", "LDLCALC", "CHOLHDL" in the last 168 hours.  Hematology Recent Labs  Lab 04/15/22 0933  WBC 4.9  RBC 5.79  HGB 14.7  HCT 46.6  MCV 80.5  MCH 25.4*  MCHC 31.5  RDW 16.2*  PLT 158   Thyroid No results for input(s): "TSH", "FREET4" in the last 168 hours.  BNP Recent Labs  Lab 04/15/22 0933  BNP 1,220.8*    DDimer No results for input(s): "DDIMER" in the last 168 hours.   Radiology/Studies:  DG Chest 2 View  Result Date: 04/15/2022 CLINICAL DATA:  Worsening shortness of breath for 2 weeks with left of center chest pains. Cough producing brown mucus. EXAM: CHEST - 2 VIEW COMPARISON:  Chest two views 01/22/2022 FINDINGS: Left chest wall AICD with  single lead again overlying the right ventricle unchanged. Status post median sternotomy and CABG. Cardiac silhouette is again mildly enlarged. Mediastinal contours are within normal limits. The lungs are clear. No pleural effusion or pneumothorax. Mild-to-moderate multilevel degenerative disc changes of the thoracic spine. IMPRESSION: No significant change from prior. Status post median sternotomy and CABG with mild cardiomegaly. No acute lung process. Electronically Signed   By: Yvonne Kendall M.D.   On: 04/15/2022 10:31     Assessment and Plan:   Verland Sprinkle is a 61 y.o. male with a hx of nonischemic cardiomyopathy (EF 15%), status post CABG '07 due to diagnostic cath complicated by left main dissection, status post ICD, paroxysmal atrial fibrillation, Barrett's esophagus, dyslipidemia, OSA and GI bleeding who is being seen 04/15/2022 for the evaluation of CHF at the request of Dr. Pearline Cables.  NICM/HFrEF/BiV failure: known EF <15%, moderate RV dysfunction on echo from 01/2022 at [facility]. Medications PTA include coreg 6.37m BID, Entresto 24-26 BID, Jardiance 120mdaily, spiro 2516maily which he reports being compliant with.  Now presents with significant volume overload, BNP 1220.  He is volume overloaded on exam with JVD.  Has been given IV Lasix 20 mg with good urine output thus far.  Of note in speaking with patient he reports that his blood pressures tend to run on the softer side at home, sometimes with systolic pressures in the 70s.  Suspect may need to dial back on some on his heart failure medications if this remains an issues. -- With hypotension holding GDMT for now to allow for diuresis -- check lactic acid, currently warm/wet on exam -- Pending response, low threshold to consider inotrope -- Plan to add back GDMT as tolerated   Acute hypoxic respiratory failure: Currently tolerating BiPAP  CAD status post CABG (SVG-LAD, SVG-OM): High-sensitivity troponin 27>> 23.  No anginal symptoms  PTA  S/p MDT ICD: [facility] as with Dr. CamCurt BearsValvular heart disease: Moderate TR, mild MR, mild AR [facility]:  Currently in sinus rhythm --Eliquis 5 mg twice daily  Hyperlipidemia: On statin therapy  CKD stage III: Baseline creatinine 1.6-1.7 -- Follow BMet with diuresis  OSA: On CPAP at home  Risk Assessment/Risk Scores:     New York Heart Association (NYHA) Functional Class NYHA Class III  CHA2DS2-VASc Score = 4   This indicates a 4.8% annual risk of stroke. The patient's score is based upon: CHF History: 1 HTN History: 1 Diabetes History: 1 Stroke History: 0 Vascular Disease History: 1 Age Score: 0 Gender Score: 0      For questions or updates, please contact [place] Please consult [email or web address] for contact info under    Signed, Reino Bellis, NP  04/15/2022 2:57 PM  Patient seen and examined with Harlan Stains, NP.  Agree as above, with the following exceptions and changes as noted below.  Patient is a 61 year old gentleman seen today with his fiance at the bedside Olivia Mackie who presents for 2 weeks of shortness of breath with severely reduced ejection fraction and nonischemic cardiomyopathy.  Follows with my partner Dr. [name] as an outpatient.  Currently on BiPAP due to dyspnea with minimal exertion in ER.  Has received some low-dose Lasix thus far with adequate diuresis.  Repeat echocardiogram pending, however last echo showed EF 15 to 20%, thinning of the basal septum, elevated LVEDP, moderately reduced to moderately enlarged RV function with severely elevated pulmonary artery systolic pressure of 58.8 mmHg, biatrial enlargement, severe tricuspid valve regurgitation and mild mitral valve regurgitation.  Gen: On BiPAP, breathing well with assistance, CV: Regular rhythm with occasional irregularity due to PVCs, 2/6 systolic murmur, JVP elevated to the earlobe at 45 degrees, Lungs: clear, Abd: soft,  Extrem: Excoriations on the shins, 1+ edema in the ankles edema, Neuro/Psych: alert and oriented x 3, normal mood and affect. All available labs, radiology testing, previous records reviewed.  Patient appears to need diuresis for decompensated heart failure.  I am quite concerned about his hypotension and severely reduced ejection fraction.  We will attempt diuresis with Lasix 40 mg IV twice daily to start, however if diuresis is not sufficient may need to consider inotropic support to facilitate diuresis.  We may want invasive hemodynamics with right heart catheterization after diuresis, however he is already known to have severe pulmonary hypertension likely from left heart disease.  Interestingly on his echocardiogram he has basal septal thinning which can be seen with cardiac sarcoidosis, however he follows with [facility]ology at [facility] and has had a known nonischemic cardiomyopathy for quite some time.  Elouise Munroe, MD 04/15/22 4:38 PM

## 2022-04-15 NOTE — ED Triage Notes (Signed)
Patient complains of 2 weeks of SOB and states he has had same in past when he was diagnosed with heart failure. Denies pain. Alert and oriented

## 2022-04-15 NOTE — ED Provider Triage Note (Signed)
Emergency Medicine Provider Triage Evaluation Note  Hosteen Kienast , a 61 y.o. male  was evaluated in triage.  Pt complains of shortness of breath that has been progressively worsening over the last 2 weeks.  He does endorse some left-sided chest squeezing.  Patient does have a history of heart failure and is on torsemide daily.  He denies any abdominal pain, nausea, vomiting, diarrhea, fever, chills.  Review of Systems  Positive:  Negative: See above   Physical Exam  BP 90/67 (BP Location: Right Arm)   Pulse 64   Temp 97.9 F (36.6 C) (Oral)   Resp 16   SpO2 100%  Gen:   Awake, no distress   Resp:  Normal effort  MSK:   Moves extremities without difficulty  Other:    Medical Decision Making  Medically screening exam initiated at 9:28 AM.  Appropriate orders placed.  Zimri Brennen was informed that the remainder of the evaluation will be completed by another provider, this initial triage assessment does not replace that evaluation, and the importance of remaining in the ED until their evaluation is complete.     Myna Bright [place], [place] 04/15/22 [phone]

## 2022-04-15 NOTE — ED Notes (Signed)
Pt removed from Bipap and placed on 2L.

## 2022-04-15 NOTE — Progress Notes (Signed)
Per MD order, pt was placed on BiPAP. Pt tolerating well with SVS. Pt c/o left sided CP. RN notified. RT will continue to monitor pt.Bruce Robertson

## 2022-04-15 NOTE — ED Notes (Signed)
Hypotension noted.  EDP notified, PT is asymptomatic at this time

## 2022-04-15 NOTE — H&P (Signed)
 NAME:  Bruce Robertson, MRN:  [id number], DOB:  07/10/1961, LOS: 0 ADMISSION DATE:  04/15/2022, Primary: Koberlein, Junell C, MD (Inactive)  CHIEF COMPLAINT: Shortness of breath  Medical Service: Internal Medicine Teaching Service         Attending Physician: Dr. Guilloud, Carolyn, MD    First Contact: Dr. Ariwodo Pager: [phone]  Second Contact: Dr. Nguyen Pager: [phone]       After Hours (After 5p/  First Contact Pager: [phone]  weekends / holidays): Second Contact Pager: [phone]    History of present illness   Bruce Robertson is a 61-year-old male with nonischemic cardiomyopathy with a reduced ejection fraction of 15 to 20% and diastolic dysfunction who presents with 2-week history of progressive dyspnea.  No preceding viral symptoms.  Baseline NYHA class III symptoms progressed to class IV over the past 2 weeks.  Experiences significant dyspnea with even minimal activity.  Associated with orthopnea and cough which he notes intermittently produces some light brown sputum. He admits to missing a few doses of his medications over the the past week as well as having increased his soda intake.  He reports normal urine output.  He also has been experiencing left shoulder and left upper chest pain since falling out of bed 2 weeks ago.  When he fell, he fell onto his left shoulder.  Since then, he has been experiencing intermittent spasms associated with radiculopathic pain.  These episodes only last a few seconds and resolve without intervention.  He denies neck pain.  He has been following with Dr. Patel at [facility] at the advanced heart failure clinic.  Back in January, there was some concern about a decrease in his functional status and that he may be nearing the time for advanced therapies however he underwent cardiopulmonary stress testing which showed relative VO2 of 55% of predicted which was reassuring.  Past Medical History  He,  has a past medical history of AICD (automatic  cardioverter/defibrillator) present, Anxiety (08/20/2016), Barrett's esophagus (2002), Cardiomyopathy, CHF (congestive heart failure) (HCC), CHF exacerbation (HCC) (07/31/2016), Colon polyps (2002), Coronary artery disease, Diabetes mellitus without complication (HCC), Dyslipidemia, GERD (gastroesophageal reflux disease), History of bleeding peptic ulcer, History of echocardiogram, Hyperplastic rectal polyp, Moderate episode of recurrent major depressive disorder (HCC) (08/20/2016), OSA on CPAP, Pneumonia (09/2018), and Tobacco abuse.   Home Medications     Prior to Admission medications   Medication Sig Start Date End Date Taking? Authorizing Provider  albuterol (VENTOLIN HFA) 108 (90 Base) MCG/ACT inhaler Inhale 2 puffs into the lungs every 6 (six) hours as needed for wheezing or shortness of breath. 11/30/21  Yes Koberlein, Junell C, MD  Budeson-Glycopyrrol-Formoterol (BREZTRI AEROSPHERE) 160-9-4.8 MCG/ACT AERO Inhale 2 puffs into the lungs 2 (two) times daily. 02/06/22  Yes Koberlein, Junell C, MD  carvedilol (COREG) 6.25 MG tablet Take 1 tablet (6.25 mg total) by mouth 2 (two) times daily. 04/05/22  Yes Hochrein, James, MD  ELIQUIS 5 MG TABS tablet Take 1 tablet by mouth twice daily 09/28/21  Yes Koberlein, Junell C, MD  ENTRESTO 24-26 MG TAKE 1 TABLET BY MOUTH IN THE MORNING AND AT BEDTIME 11/20/21  Yes Koberlein, Junell C, MD  JARDIANCE 10 MG TABS tablet Take 1 tablet by mouth once daily 03/19/22  Yes Koberlein, Junell C, MD  omeprazole (PRILOSEC) 40 MG capsule Take 40 mg by mouth daily. 11/19/21  Yes [provider]  sildenafil (VIAGRA) 50 MG tablet TAKE 1 TABLET BY MOUTH ONCE DAILY AS NEEDED FOR ERECTILE DYSFUNCTION 02/12/22    Yes Koberlein, Junell C, MD  simvastatin (ZOCOR) 40 MG tablet TAKE 1 TABLET BY MOUTH AT BEDTIME 12/18/21  Yes Minus Breeding, MD  Spacer/Aero-Holding Chambers (AEROCHAMBER PLUS) inhaler Use as instructed 05/25/21  Yes Koberlein, Junell C, MD  spironolactone (ALDACTONE)  25 MG tablet Take 25 mg by mouth daily.  03/30/20  Yes [provider]  torsemide (DEMADEX) 20 MG tablet Take 2 tablets (40 mg total) by mouth daily. 10/05/21  Yes Minus Breeding, MD  ACCU-CHEK GUIDE test strip Use as directed-patient needs an appt 07/18/21   Caren Macadam, MD  Accu-Chek Softclix Lancets lancets Use as directed 07/18/21   Caren Macadam, MD  blood glucose meter kit and supplies KIT Dispense based on patient and insurance preference. Use up to four times daily as directed. 02/14/20   Caren Macadam, MD    Allergies    Allergies as of 04/15/2022   (No Known Allergies)    Social History   reports that he has quit smoking. His smoking use included cigarettes. He has a 1.75 pack-year smoking history. He has never used smokeless tobacco. He reports that he does not currently use alcohol. He reports current drug use. Frequency: 1.00 time per week. Drug: Marijuana.   Family History   His family history includes Bipolar disorder in his daughter; Cancer in his maternal grandmother and maternal uncle; Diabetes in his daughter and niece; Heart failure (age of onset: 33) in his father; High blood pressure in his father; Hypertension in his mother; Lung cancer in an other family member; Osteoarthritis in his mother. There is no history of Asthma or Breast cancer.   Objective   Blood pressure 95/78, pulse (!) 54, temperature 97.9 F (36.6 C), temperature source Oral, resp. rate 16, SpO2 100 %.    General: Acutely ill-appearing male resting comfortably in bed visiting with family members present in the room while on BiPAP HEENT: Moist mucous membranes Cardiac: Heart regular rate and rhythm.  No lower extremity edema, JVD present Pulm: Breathing comfortably on BiPAP with 40% FiO2.  No accessory muscle use.  Bilateral crackles. MSK: Tenderness to palpation over the anterior and lateral aspects of the left shoulder extending to the left upper chest.  No pain with neck  movement.  Strength is 5 out of 5 and equal in the bilateral upper extremities.  Pain reproducible with shoulder range of motion. Neuro: Alert and oriented x4.  Converses easily.  Moves all extremities. Skin: Warm and dry Significant Diagnostic Tests:      Latest Ref Rng & Units 04/15/2022    9:33 AM 05/30/2021    8:48 AM 12/01/2020   10:33 AM  CBC  WBC 4.0 - 10.5 K/uL 4.9  10.4  7.4   Hemoglobin 13.0 - 17.0 g/dL 14.7  12.6  12.3   Hematocrit 39.0 - 52.0 % 46.6  40.0  40.1   Platelets 150 - 400 K/uL 158  159.0  190.0       Latest Ref Rng & Units 04/15/2022    9:33 AM 01/03/2022    8:22 AM 11/30/2021    1:01 PM  BMP  Glucose 70 - 99 mg/dL 120  99  88   BUN 6 - 20 mg/dL _0 Creatinine 0.61 - 1.24 mg/dL 1.74  1.60  1.78   Sodium 135 - 145 mmol/L 134  136  137   Potassium 3.5 - 5.1 mmol/L 3.8  3.7  3.4   Chloride 98 - 111  mmol/L 98  96  96   CO2 22 - 32 mmol/L 28  32  33   Calcium 8.9 - 10.3 mg/dL 9.1  9.5  9.8    BMP 1220 Troponin 27>>23  CXR: Personally reviewed.  Cardiomegaly.  No significant pulmonary edema, no effusion Summary  61 year old male with NICM with reduced ejection fraction of 15 to 56% and diastolic dysfunction presenting for progressive shortness of breath over the past 2 weeks found to be in a heart failure exacerbation complicated by acute hypoxic respiratory failure requiring NIPPV.   Assessment & Plan:  Active Problems:   CHF exacerbation (HCC)  Acute hypoxic respiratory failure requiring NIPPV CHF exacerbation Nonischemic cardiomyopathy with reduced EF 15 to 43% and diastolic dysfunction - EF initially 40%, then dropped to 20 to 25% status post CABG in 2007 due to diagnostic cath complicated by LM dissection.  Last echocardiogram was in April 2021 which revealed LVEF 15 to 20% - Primary cardiologist: Dr. [name];   Advanced heart failure specialist: Dr. Posey Pronto with [facility]; EP Dr. Curt Bears - Status post ICD for primary prevention and PAF -  Last hospitalization for CHF exacerbation 2019  Assessment: Suspect missed medication doses and increased fluid intake over the past 2 weeks or the primary contributors to his current exacerbation.  He is stable enough for the floor at this time however he is high risk for developing hypotension with diuresis given his soft blood pressures.  No sign of low output at this time.  He will need to be monitored closely for worsening respiratory status and the development of hypotension and may require inotropic support if this occurs. Unsure about the light brown material that he intermittently has been coughing up over the past 2 weeks.  Could be some mild hemoptysis secondary to pulmonary edema.  Can consider further work-up if symptoms persist after resolution of CHF exacerbation Admission weight was not obtained yet in the ER.  Based on review of his prior weights however, it appears that they have Been trending down which has been thought to be related to cardiac cachexia.  Plan - Cardiology consulted by the ED.   -Received 20 mg of Lasix in the ED.  Will defer diuresis to cardiology - Hold Coreg, spironolactone, and Entresto for now, resume as soon as able - Wean off BiPAP as able.  N.p.o. except meds - Echocardiogram - Continuous telemetry, strict I/O, daily labs  Acute traumatic left shoulder pain with radiculopathy.  Exam findings consistent with musculoskeletal. - Consider obtaining C-spine and left shoulder radiographs once off BiPAP - Supportive care with heat/ice, Tylenol, Voltaren gel.  Paroxysmal atrial fibrillation.  CHA2DS2-VASc 5 - Continue Eliquis  Hyperlipidemia - Continue simvastatin  Mild asymptomatic hyponatremia.  Continue to monitor  CKD 3A.  Chronic and stable continue to monitor  OSA - Uses CPAP at home  Type 2 diabetes mellitus.  Last A1c was 7.0 in January. - Check A1c - Hold Jardiance - CBG checks twice a day.  In hospital glucose goal 140-180  Marijuana  use disorder - Counseled on cessation  GERD - Protonix 40 mg daily Best practice:  CODE STATUS: Full DVT for prophylaxis: Eliquis Social considerations/Family communication: Patient's fianc and sister were updated at bedside Dispo: Admit patient to Inpatient with expected length of stay greater than 2 midnights.   Mitzi Hansen, MD Internal Medicine Resident PGY-3 [facility] Pager: [phone] 04/15/2022 1:50 PM

## 2022-04-15 NOTE — Progress Notes (Signed)
Pt set up on night time BIPAP.  Pt states he will self administer when he is ready for rest.

## 2022-04-15 NOTE — ED Notes (Signed)
Pt is in significant respiratory distress at this time after ambulating.  Dr. Pearline Cables notified.  PT is 100% but we placed on 5Lfor support and respiratory has been called to place on Bipap which he wears at home.

## 2022-04-16 ENCOUNTER — Other Ambulatory Visit (HOSPITAL_COMMUNITY): Payer: Self-pay

## 2022-04-16 ENCOUNTER — Inpatient Hospital Stay (HOSPITAL_COMMUNITY): Payer: Medicare HMO

## 2022-04-16 DIAGNOSIS — E1122 Type 2 diabetes mellitus with diabetic chronic kidney disease: Secondary | ICD-10-CM

## 2022-04-16 DIAGNOSIS — N183 Chronic kidney disease, stage 3 unspecified: Secondary | ICD-10-CM

## 2022-04-16 DIAGNOSIS — I5031 Acute diastolic (congestive) heart failure: Secondary | ICD-10-CM | POA: Diagnosis not present

## 2022-04-16 DIAGNOSIS — I480 Paroxysmal atrial fibrillation: Secondary | ICD-10-CM

## 2022-04-16 DIAGNOSIS — I5082 Biventricular heart failure: Secondary | ICD-10-CM

## 2022-04-16 DIAGNOSIS — G4733 Obstructive sleep apnea (adult) (pediatric): Secondary | ICD-10-CM

## 2022-04-16 DIAGNOSIS — Z7984 Long term (current) use of oral hypoglycemic drugs: Secondary | ICD-10-CM

## 2022-04-16 DIAGNOSIS — K219 Gastro-esophageal reflux disease without esophagitis: Secondary | ICD-10-CM

## 2022-04-16 DIAGNOSIS — E871 Hypo-osmolality and hyponatremia: Secondary | ICD-10-CM

## 2022-04-16 DIAGNOSIS — I428 Other cardiomyopathies: Secondary | ICD-10-CM

## 2022-04-16 DIAGNOSIS — E785 Hyperlipidemia, unspecified: Secondary | ICD-10-CM

## 2022-04-16 LAB — MAGNESIUM: Magnesium: 2.7 mg/dL — ABNORMAL HIGH (ref 1.7–2.4)

## 2022-04-16 LAB — ECHOCARDIOGRAM COMPLETE
AR max vel: 1.63 cm2
AV Peak grad: 8.3 mmHg
Ao pk vel: 1.44 m/s
Area-P 1/2: 7.02 cm2
Calc EF: 18.7 %
Height: 72 in
MV M vel: 2.87 m/s
MV Peak grad: 33 mmHg
P 1/2 time: 390 msec
S' Lateral: 6.7 cm
Single Plane A2C EF: 16.4 %
Single Plane A4C EF: 17.9 %
Weight: 3262.4 oz

## 2022-04-16 LAB — BASIC METABOLIC PANEL
Anion gap: 10 (ref 5–15)
BUN: 22 mg/dL — ABNORMAL HIGH (ref 6–20)
CO2: 26 mmol/L (ref 22–32)
Calcium: 9 mg/dL (ref 8.9–10.3)
Chloride: 97 mmol/L — ABNORMAL LOW (ref 98–111)
Creatinine, Ser: 1.61 mg/dL — ABNORMAL HIGH (ref 0.61–1.24)
GFR, Estimated: 49 mL/min — ABNORMAL LOW (ref >60)
Glucose, Bld: 106 mg/dL — ABNORMAL HIGH (ref 70–99)
Potassium: 3.6 mmol/L (ref 3.5–5.1)
Sodium: 133 mmol/L — ABNORMAL LOW (ref 135–145)

## 2022-04-16 MED ORDER — PERFLUTREN LIPID MICROSPHERE
1.0000 mL | INTRAVENOUS | Status: AC | PRN
  Administered 2022-04-16: 2 mL via INTRAVENOUS

## 2022-04-16 MED ORDER — SPIRONOLACTONE 25 MG PO TABS: 25.0000 mg | ORAL_TABLET | Freq: Every day | ORAL | Status: DC

## 2022-04-16 NOTE — Progress Notes (Addendum)
Subjective: Patient was seen and evaluated at bedside.  He states he is feeling much better today.  He denies chest pain or shortness of breath.  We had discussion regarding his heart failure care.  He acknowledges and is agreeable to what ever plan we feel is best.  Objective:  Vital signs in last 24 hours: Vitals:   04/16/22 0143 04/16/22 0517 04/16/22 0830 04/16/22 1042  BP: 101/70 1'07/72 95/69 90/68 '$  Pulse: 63 65 (!) 59 68  Resp: '18 18 18 18  '$ Temp: 98.2 F (36.8 C) 98.1 F (36.7 C) 97.9 F (36.6 C) 97.8 F (36.6 C)  TempSrc: Oral Oral Oral Oral  SpO2: 94% 95% 100% 99%  Weight:  92.5 kg    Height:       Physical Exam Constitutional:      General: He is not in acute distress. HENT:     Head: Normocephalic and atraumatic.  Neck:     Vascular: JVD present.  Cardiovascular:     Rate and Rhythm: Normal rate.     Pulses:          Dorsalis pedis pulses are 2+ on the right side and 2+ on the left side.       Posterior tibial pulses are 2+ on the right side and 2+ on the left side.  Pulmonary:     Effort: Pulmonary effort is normal.     Breath sounds: Normal breath sounds and air entry.  Abdominal:     Palpations: Abdomen is soft.  Musculoskeletal:     Right lower leg: No edema.     Left lower leg: No edema.  Skin:    General: Skin is warm and dry.     Comments: Lower extremities warm and well perfused.  Neurological:     Mental Status: He is alert and oriented to person, place, and time. Mental status is at baseline.  Psychiatric:        Behavior: Behavior normal. Behavior is cooperative.    Assessment/Plan:  Active Problems:   CHF exacerbation (HCC)  CHF exacerbation Nonischemic cardiomyopathy with reduced EF 15 to 54% and diastolic dysfunction\ Biventricular heart failure  Repeat echo shows EF of less than 20% with global hypokinesis.  Grade III DD; severely dilated atria and ventricles bilaterally.  BNP was elevated at 1200; tropes were flat; chest x-ray  negative for active cardiopulmonary disease.  On exam, JVD present, lungs are clear to auscultation, lower extremities are warm and well-perfused.  Cardiology on board. -Cardiology recommendations appreciated -Per cardiology, plan to add back GDMT as tolerated -Continue Lasix IV 40 twice daily and trend BMP/Mg -Consider adding inotrope agent ?  Paroxysmal atrial fibrillation Currently in sinus rhythm --Eliquis 5 mg twice daily  Type 2 Diabetes mellitus A1c 7% on admission. -Hold Jardiance -CBG goal 140-180   CKD stage III:  Baseline creatinine 1.6-1.7 -Trend BMP as diuresis continues  Hyperlipidemia:  -Simvastatin '40mg'$  daily  Mild Hyponatremia On admission, sodium levels mildly decreased at 133, following x1 dose of lasix for diuresis.  Patient is asymptomatic. -trend BMP    OSA:  -On CPAP at home  GERD -Protonix '40mg'$  daily  CABG s/p diagnostic cath complication S/p MDT ICD: Valvular heart disease:  -High-sensitivity troponin 27>> 23, flat.  Denies any symptoms at this time. -follows at [facility] as well as with Dr. Curt Bears   Acute hypoxic respiratory failure, resolved On admission patient tolerated BiPAP.  After diuresis, patient's breathing status improved and on exam he was saturating well  on room air.    Prior to Admission Living Arrangement: Anticipated Discharge Location: Barriers to Discharge: Dispo: Anticipated discharge in approximately 1-3 day(s).   Timothy Lasso, MD 04/16/2022, 2:03 PM Pager: [phone] After 5pm on weekdays and 1pm on weekends: On Call pager [phone]

## 2022-04-16 NOTE — Progress Notes (Addendum)
Progress Note  Patient Name: Bruce Robertson Date of Encounter: 04/16/2022  [facility] Cardiologist: Minus Breeding, MD   Subjective   Feels near baseline, much better.   Inpatient Medications    Scheduled Meds:  apixaban  5 mg Oral BID   diclofenac Sodium  4 g Topical QID   fluticasone furoate-vilanterol  1 puff Inhalation Daily   furosemide  40 mg Intravenous BID   pantoprazole  40 mg Oral Daily   simvastatin  40 mg Oral QHS   sodium chloride flush  3 mL Intravenous Q12H   umeclidinium bromide  1 puff Inhalation Daily   Continuous Infusions:  sodium chloride     PRN Meds: sodium chloride, acetaminophen, ondansetron (ZOFRAN) IV, perflutren lipid microspheres (DEFINITY) IV suspension, sodium chloride flush   Vital Signs    Vitals:   04/16/22 0143 04/16/22 0517 04/16/22 0830 04/16/22 1042  BP: 101/70 1'07/72 95/69 90/68 '$  Pulse: 63 65 (!) 59 68  Resp: '18 18 18 18  '$ Temp: 98.2 F (36.8 C) 98.1 F (36.7 C) 97.9 F (36.6 C) 97.8 F (36.6 C)  TempSrc: Oral Oral Oral Oral  SpO2: 94% 95% 100% 99%  Weight:  92.5 kg    Height:        Intake/Output Summary (Last 24 hours) at 04/16/2022 1114 Last data filed at 04/16/2022 1043 Gross per 24 hour  Intake 736 ml  Output 3625 ml  Net -2889 ml      04/16/2022    5:17 AM 04/05/2022   10:23 AM 03/12/2022    2:24 PM  Last 3 Weights  Weight (lbs) 203 lb 14.4 oz 210 lb 6.4 oz 214 lb 6.4 oz  Weight (kg) 92.488 kg 95.437 kg 97.251 kg      Telemetry    SR with rare PVC - Personally Reviewed  ECG    No new - Personally Reviewed  Physical Exam   GEN: No acute distress.   Neck: JVD by EJ appears still to angle of jaw at 30 deg.  Cardiac: RRR, no murmurs, rubs, or gallops.  Respiratory: Clear to auscultation bilaterally. GI: Soft, nontender, non-distended  MS: No edema; No deformity. Neuro:  Nonfocal  Psych: Normal affect   Labs    High Sensitivity Troponin:   Recent Labs  Lab 04/15/22 0933 04/15/22 1128   TROPONINIHS 27* 23*     Chemistry Recent Labs  Lab 04/15/22 0933 04/15/22 1810 04/16/22 0300  NA 134* 135 133*  K 3.8 3.3* 3.6  CL 98  --  97*  CO2 28  --  26  GLUCOSE 120*  --  106*  BUN 20  --  22*  CREATININE 1.74*  --  1.61*  CALCIUM 9.1  --  9.0  MG  --   --  2.7*  GFRNONAA 45*  --  49*  ANIONGAP 8  --  10    Lipids No results for input(s): "CHOL", "TRIG", "HDL", "LABVLDL", "LDLCALC", "CHOLHDL" in the last 168 hours.  Hematology Recent Labs  Lab 04/15/22 0933 04/15/22 1810  WBC 4.9  --   RBC 5.79  --   HGB 14.7 16.0  HCT 46.6 47.0  MCV 80.5  --   MCH 25.4*  --   MCHC 31.5  --   RDW 16.2*  --   PLT 158  --    Thyroid No results for input(s): "TSH", "FREET4" in the last 168 hours.  BNP Recent Labs  Lab 04/15/22 0933  BNP 1,220.8*    DDimer  No results for input(s): "DDIMER" in the last 168 hours.   Radiology    DG Chest 2 View  Result Date: 04/15/2022 CLINICAL DATA:  Worsening shortness of breath for 2 weeks with left of center chest pains. Cough producing brown mucus. EXAM: CHEST - 2 VIEW COMPARISON:  Chest two views 01/22/2022 FINDINGS: Left chest wall AICD with single lead again overlying the right ventricle unchanged. Status post median sternotomy and CABG. Cardiac silhouette is again mildly enlarged. Mediastinal contours are within normal limits. The lungs are clear. No pleural effusion or pneumothorax. Mild-to-moderate multilevel degenerative disc changes of the thoracic spine. IMPRESSION: No significant change from prior. Status post median sternotomy and CABG with mild cardiomegaly. No acute lung process. Electronically Signed   By: Yvonne Kendall M.D.   On: 04/15/2022 10:31    Cardiac Studies   Echo pending, prior echo with severely reduced LVEF 15-20% and basal septal thinning   1. Left ventricular ejection fraction, by estimation, is 15-20%. The left  ventricle has severely decreased function. The left ventricle demonstrates  global hypokinesis.  The left ventricular internal cavity size was severely  dilated. Left ventricular  diastolic parameters are indeterminate. Elevated left ventricular  end-diastolic pressure.   2. Right ventricular systolic function is moderately reduced. The right  ventricular size is moderately enlarged. There is severely elevated  pulmonary artery systolic pressure. The estimated right ventricular  systolic pressure is 56.3 mmHg.   3. Left atrial size was moderately dilated.   4. Right atrial size was moderate-severely dilated.   5. The mitral valve is degenerative. Mild mitral valve regurgitation. No  evidence of mitral stenosis.   6. Tricuspid valve regurgitation is severe.   7. The aortic valve is abnormal. Aortic valve regurgitation is mild. No  aortic stenosis is present.   8. The inferior vena cava is dilated in size with <50% respiratory  variability, suggesting right atrial pressure of 15 mmHg.   Patient Profile     61 y.o. male with a hx of nonischemic cardiomyopathy (EF 15%), status post CABG '07 due to diagnostic cath complicated by left main dissection, status post ICD, paroxysmal atrial fibrillation, Barrett's esophagus, dyslipidemia, OSA and GI bleeding who is being seen for the evaluation of CHF.  Assessment & Plan    NICM/HFrEF/BiV failure: known EF <15%, moderate RV dysfunction on echo from 01/2022 at [facility]. Medications PTA include coreg 6.'25mg'$  BID, Entresto 24-26 BID, Jardiance '10mg'$  daily, spiro '25mg'$  daily which he reports being compliant with.  Now presents with significant volume overload, BNP 1220.  He is volume overloaded on exam with JVD.   -- lactate normal. Warm and wet. -- Plan to add back GDMT as tolerated  - continue lasix IV 40 mg BID for 24 more hours and reassess renal function and symptoms. Doing much better today - hypotension at home. Concern for how successful we will be with ongoing GDMT. Will have HF Toc see patient.  - no indication for inotrope today.  Cr: 1.74 >  1.61 UOP yesterday: 2 L Weight: 95.4 on 04/05/22, now 92.5 kg Net negative for admission: -2.9 L Diuretic plan: cont lasix IV bid 40 mg.    Acute hypoxic respiratory failure, improving. Off bipap and no supp O2.   CAD status post CABG (SVG-LAD, SVG-OM): High-sensitivity troponin 27>> 23.  No anginal symptoms.   S/p MDT ICD: follows at [facility] as well as with Dr. Curt Bears    Valvular heart disease: Moderate TR, mild MR, mild AR on echo 3/23 (  at [facility])   Paroxysmal atrial fibrillation: Currently in sinus rhythm --Eliquis 5 mg twice daily   Hyperlipidemia: On statin therapy   CKD stage III: Baseline creatinine 1.6-1.7 -- Follow BMet with diuresis   OSA: On CPAP at home     For questions or updates, please contact [facility] Please consult [email or web address] for contact info under        Signed, Elouise Munroe, MD  04/16/2022, 11:14 AM

## 2022-04-16 NOTE — Progress Notes (Signed)
Heart Failure Stewardship Pharmacist Progress Note   PCP: Caren Macadam, MD (Inactive) PCP-Cardiologist: Minus Breeding, MD    HPI:  61 yo M with PMH of NICM, CAD s/p CABG, s/p ICD for primary prevention, afib, Barrett's esophagus, HLD, OSA, and GI bleeding. He presented to the ED on 6/12 with 2 weeks of shortness of breath, decrease in urine output, weight gain (dry weight 197l lbs), and chest discomfort.  He is followed by [facility] and [facility] team as outpatient. 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 EF at that time dropped from 40% to 20-25%. CPX in March 2023 with moderate functional impairment - coreg changed to metoprolol but he became more fatigued so this was switched back to coreg.  In the ED, his CXR didn't show any edema. ECHO on 6/13 with EF <20%, G3DD, RV moderately reduced with moderate PAP. Also noted to have trivial MR, mild AR, and severe TR. GDMT has been held with hypotension.   Current HF Medications: Diuretic: furosemide 40 mg IV BID  Prior to admission HF Medications: Diuretic: torsemide 40 mg daily Beta blocker: carvedilol 6.25 mg BID ACE/ARB/ARNI: Entresto 24/26 mg BID Aldosterone Antagonist: spironolactone 25 mg daily SGLT2i: Jardiance 10 mg daily  Pertinent Lab Values: Serum creatinine 1.61, BUN 22, Potassium 3.6, Sodium 133, BNP 1220.8, Magnesium 2.7, A1c 7.0   Vital Signs: Weight: 203 lbs (admission weight: 203 lbs) Blood pressure: 90-100/60s  Heart rate: 60s  I/O: -2L yesterday; net -2.8L  Medication Assistance / Insurance Benefits Check: Does the patient have prescription insurance?  Yes Type of insurance plan: Office manager  Outpatient Pharmacy:  Prior to admission outpatient pharmacy: [facility] Is the patient willing to use [facility] at discharge? Yes Is the patient willing to transition their outpatient pharmacy to utilize a [facility] outpatient pharmacy?   Pending     Assessment: 1. Acute on chronic systolic CHF (LVEF <36%), due to NICM. NYHA class III symptoms. - Volume overloaded on exam, +JVD, weight up from dry weight. Consider increasing lasix to 80 mg IV BID for additional diuresis.  - Holding carvedilol, Entresto, spironolactone with hypotension - Jardiance also on hold - should have null effect on BP - consider resuming this if hypotension prevents resuming other GDMT   Plan: 1) Medication changes recommended at this time: - Increase lasix to 80 mg IV BID  2) Patient assistance: - Has commercial insurance - will verify that he uses copay cards as outpatient  3)  Education  - To be completed prior to discharge  Kerby Nora, PharmD, BCPS Heart Failure Cytogeneticist Phone [phone]

## 2022-04-16 NOTE — Plan of Care (Signed)
Patient able to maintain  O2 sats >95% without supplemental oxygen.

## 2022-04-16 NOTE — Progress Notes (Signed)
Heart Failure Navigator Progress Note  Following this hospitalization to assess for HV TOC readiness.   EF < 20 % G3 DD IV Lasix Soft BP ( GDMT management)  Earnestine Leys, BSN, RN Heart Failure Transport planner Only

## 2022-04-16 NOTE — TOC Progression Note (Signed)
Transition of Care [facility]) - Progression Note    Patient Details  Name: Pratt Bress MRN: [id number] Date of Birth: [birth year]  Transition of Care [facility]) CM/SW Contact  Zenon Mayo, RN Phone Number: 04/16/2022, 4:37 PM  Clinical Narrative:    from home CHF EX, conts on IV lasix, TOC will continue to follow for dc needs.         Expected Discharge Plan and Services                                                 Social Determinants of Health (SDOH) Interventions    Readmission Risk Interventions     No data to display

## 2022-04-17 ENCOUNTER — Encounter (HOSPITAL_COMMUNITY): Payer: Self-pay | Admitting: Internal Medicine

## 2022-04-17 DIAGNOSIS — I428 Other cardiomyopathies: Secondary | ICD-10-CM | POA: Diagnosis not present

## 2022-04-17 DIAGNOSIS — I5082 Biventricular heart failure: Secondary | ICD-10-CM | POA: Diagnosis not present

## 2022-04-17 DIAGNOSIS — E1122 Type 2 diabetes mellitus with diabetic chronic kidney disease: Secondary | ICD-10-CM | POA: Diagnosis not present

## 2022-04-17 DIAGNOSIS — I480 Paroxysmal atrial fibrillation: Secondary | ICD-10-CM | POA: Diagnosis not present

## 2022-04-17 LAB — BASIC METABOLIC PANEL
Anion gap: 8 (ref 5–15)
BUN: 18 mg/dL (ref 6–20)
CO2: 30 mmol/L (ref 22–32)
Calcium: 9.3 mg/dL (ref 8.9–10.3)
Chloride: 97 mmol/L — ABNORMAL LOW (ref 98–111)
Creatinine, Ser: 1.68 mg/dL — ABNORMAL HIGH (ref 0.61–1.24)
GFR, Estimated: 47 mL/min — ABNORMAL LOW (ref >60)
Glucose, Bld: 115 mg/dL — ABNORMAL HIGH (ref 70–99)
Potassium: 4 mmol/L (ref 3.5–5.1)
Sodium: 135 mmol/L (ref 135–145)

## 2022-04-17 LAB — MAGNESIUM: Magnesium: 2.6 mg/dL — ABNORMAL HIGH (ref 1.7–2.4)

## 2022-04-17 MED ORDER — EMPAGLIFLOZIN 10 MG PO TABS
10.0000 mg | ORAL_TABLET | Freq: Every day | ORAL | Status: DC
  Administered 2022-04-17 – 2022-04-18 (×2): 10 mg via ORAL
  Filled 2022-04-17 (×2): qty 1

## 2022-04-17 MED ORDER — TORSEMIDE 20 MG PO TABS
40.0000 mg | ORAL_TABLET | Freq: Every day | ORAL | Status: DC
  Administered 2022-04-18: 40 mg via ORAL
  Filled 2022-04-17: qty 2

## 2022-04-17 NOTE — Progress Notes (Signed)
Heart Failure Stewardship Pharmacist Progress Note   PCP: Caren Macadam, MD (Inactive) PCP-Cardiologist: Minus Breeding, MD    HPI:  61 yo M with PMH of NICM, CAD s/p CABG, s/p ICD for primary prevention, afib, Barrett's esophagus, HLD, OSA, and GI bleeding. He presented to the ED on 6/12 with 2 weeks of shortness of breath, decrease in urine output, weight gain (dry weight 197l lbs), and chest discomfort.  He is followed by [facility] and [facility] team as outpatient. 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 EF at that time dropped from 40% to 20-25%. CPX in March 2023 with moderate functional impairment - coreg changed to metoprolol but he became more fatigued so this was switched back to coreg.  In the ED, his CXR didn't show any edema. ECHO on 6/13 with EF <20%, G3DD, RV moderately reduced with moderate PAP. Also noted to have trivial MR, mild AR, and severe TR. GDMT has been held with hypotension.   Current HF Medications: Diuretic: furosemide 40 mg IV BID SGLT2i: Jardiance 10 mg daily  Prior to admission HF Medications: Diuretic: torsemide 40 mg daily Beta blocker: carvedilol 6.25 mg BID ACE/ARB/ARNI: Entresto 24/26 mg BID Aldosterone Antagonist: spironolactone 25 mg daily SGLT2i: Jardiance 10 mg daily  Pertinent Lab Values: Serum creatinine 1.68, BUN 18, Potassium 4.0, Sodium 135, BNP 1220.8, Magnesium 2.6, A1c 7.0   Vital Signs: Weight: 200 lbs (admission weight: 203 lbs) Blood pressure: 90-100/70s  Heart rate: 60-70s  I/O: -3.3L yesterday; net -4.5L  Medication Assistance / Insurance Benefits Check: Does the patient have prescription insurance?  Yes Type of insurance plan: Office manager  Outpatient Pharmacy:  Prior to [facility] pharmacy: [facility] Is the patient willing to use [facility] at discharge? Yes Is the patient willing to transition their outpatient pharmacy to utilize a [facility]  outpatient pharmacy?   Pending    Assessment: 1. Acute on chronic systolic CHF (LVEF <63%), due to NICM. NYHA class III symptoms. - Volume overloaded on exam, +JVD, weight trending down with 3L out yesterday. Continue furosemide 40 mg IV BID.  - Holding carvedilol, Entresto, spironolactone with hypotension - Jardiance resumed today - should have null effect on BP    Plan: 1) Medication changes recommended at this time: - Agree with changes; continue IV diuresis  2) Patient assistance: - Has commercial insurance - will verify that he uses copay cards as outpatient  3)  Education  - To be completed prior to discharge  Kerby Nora, PharmD, BCPS Heart Failure Cytogeneticist Phone [phone]

## 2022-04-17 NOTE — Hospital Course (Addendum)
   CHF exacerbation Nonischemic cardiomyopathy with reduced EF 15 to 25% and diastolic dysfunction/biventricular heart failure Patient presented to the ED complaining of progressive shortness of breath and was admitted for CHF exacerbation.  BNP elevated at 1200.  High-sensitivity troponins were flat.  Chest x-ray negative for active cardiopulmonary disease.  On exam, JVD present, lungs were clear to auscultation and lower extremity were warm and well-perfused.  Patient was noted to be hypotensive SBP in the 80s and 90s.  There was concern for low output heart failure.  Cardiology was consulted.  Patient's GDMT was held in the setting of hypotension.  Assessment consistent with hypovolemic state and patient was started on IV Lasix 40 mg twice daily.  Patient tolerated diuresis well.  Patient did have an episode of symptomatic hypotension; orthostatics were obtained and were negative.  No abnormal changes on cardiac telemetry.  IV Lasix was discontinued and patient was transitioned to oral diuretics.  GDMT regimen was slowly added back.  Patient tolerated well.  Patient is stable and back to dry weight.  Patient is to follow-up with [facility] [place] cardiology at time of discharge.  Paroxysmal A-fib Patient was placed on cardiac telemetry throughout hospitalization.  Patient remained in normal sinus rhythm.  Home medication Eliquis 5 mg twice daily was continued throughout hospitalization.  Type 2 diabetes mellitus Home medication includes Jardiance 10 mg daily.  A1c assessed on admission was 7%.  CBG goal 1 40-1 80 in which patient maintained at goal glucose range throughout hospitalization.  Patient was restarted on home medication Jardiance throughout hospitalization.  CKD stage IIIa Baseline creatinine 1.6-1.7.  BMP was trended throughout hospitalization as patient was being diuresed.  Patient tolerated diuresis well.  Creatinine levels remained stable around baseline.  Mild hyponatremia On admission, sodium  levels mildly decreased at 133.  This was noted following 1 dose of Lasix given in the ED.  Patient was asymptomatic.  With continued diuresis for hypovolemic state, patient sodium levels normalized.  OSA Patient was continued on CPAP at bedtime.  GERD Patient was continued on Protonix 40 mg daily throughout hospitalization.

## 2022-04-17 NOTE — Progress Notes (Signed)
Pt stated that he will place CPAP on when he is ready to sleep.

## 2022-04-17 NOTE — Progress Notes (Signed)
Patient refused CPAP for the night  

## 2022-04-17 NOTE — Progress Notes (Signed)
Mobility Specialist Progress Note:  Orthostatic BPs Supine 104/75  Sitting 107/77  Standing 112/80  Standing after 3 min 108/78       Outpatient Services [place] Mobility Specialist

## 2022-04-17 NOTE — Progress Notes (Signed)
Subjective: Patient seen and evaluated at bedside.  He states that he slept well.  He denies any complaints at this time.  Objective:  Vital signs in last 24 hours: Vitals:   04/17/22 0729 04/17/22 1102 04/17/22 1131 04/17/22 1459  BP: (!) 84/61   102/81  Pulse: 76     Resp: 19  18   Temp: 97.9 F (36.6 C)  98.1 F (36.7 C)   TempSrc: Oral  Oral   SpO2: 97% 96%    Weight:      Height:       Physical Exam Constitutional:      General: He is not in acute distress. HENT:     Head: Normocephalic and atraumatic.  Neck:     Vascular: No JVD.  Cardiovascular:     Heart sounds: Normal heart sounds.  Pulmonary:     Effort: Pulmonary effort is normal.     Breath sounds: Normal air entry.  Abdominal:     General: Bowel sounds are normal.     Tenderness: There is no abdominal tenderness.  Musculoskeletal:     Right lower leg: No edema.     Left lower leg: No edema.  Skin:    General: Skin is warm and dry.  Neurological:     General: No focal deficit present.     Mental Status: He is alert.  Psychiatric:        Behavior: Behavior normal. Behavior is cooperative.      Assessment/Plan:  Active Problems:   CHF exacerbation (HCC)   Bruce Robertson 61 year old male with NICM with reduced ejection fraction of 15 to 16% and diastolic dysfunction presenting for progressive shortness of breath over the past 2 weeks found to be in a heart failure exacerbation complicated by acute hypoxic respiratory failure requiring NIPPV.   CHF exacerbation Nonischemic cardiomyopathy with reduced EF 15 to 10% and diastolic dysfunction\ Biventricular heart failure  Patient continues to be hypotensive SBP 80s to low 100s.  Patient states this has been his baseline BP for the last several months.  There was concern for low output HF.  However on exam, patient is mentating fine, no JVD present, extremities warm and well-perfused.  We continue to diurese him, net output overnight 2.4 L with a  total of 4 L since admission.  BMP significant for creatinine level stable around ~1.6, potassium and magnesium levels at goal.  Patient is tolerating diuresis well.  In the interim, patient developed symptomatic hypotension (see additional progress note).  Patient may have been over diuresed. -Discontinue IV Lasix twice daily -Transition to oral torsemide 40 mg daily starting tomorrow -Trend BMP -K+ >4 and Mg >2 -Strict I's and O's -Slowly resume GDMT; consider resuming spironolactone tomorrow; will hold off on Entresto as patient may not tolerate well; may consider resuming beta-blocker tomorrow as well. -Resume jardiance -Patient is to follow-up with [place] [facility] cardiology at time of discharge  Paroxysmal atrial fibrillation Currently in sinus rhythm --Eliquis 5 mg twice daily   Type 2 Diabetes mellitus A1c 7% on admission. -Resume Jardiance -CBG goal 140-180   CKD stage III:  Baseline creatinine 1.6-1.7 -Trend BMP as diuresis continues   Hyperlipidemia:  -Simvastatin '40mg'$  daily    OSA:  -On CPAP at home   GERD -Protonix '40mg'$  daily   CABG s/p diagnostic cath complication S/p MDT ICD: Valvular heart disease:  Denies any symptoms at this time. -follows at [facility] as well as with Dr. Curt Bears    Prior to Admission Living  Arrangement: Anticipated Discharge Location: Barriers to Discharge: Dispo: Anticipated discharge in approximately 1-2 day(s).   Timothy Lasso, MD 04/17/2022, 3:35 PM Pager: [phone] After 5pm on weekdays and 1pm on weekends: On Call pager [phone]

## 2022-04-17 NOTE — Progress Notes (Signed)
Progress Note  Patient Name: Bruce Robertson Date of Encounter: 04/17/2022  [facility] Cardiologist: Minus Breeding, MD   Subjective   No CP or SOB  Inpatient Medications    Scheduled Meds:  apixaban  5 mg Oral BID   diclofenac Sodium  4 g Topical QID   fluticasone furoate-vilanterol  1 puff Inhalation Daily   furosemide  40 mg Intravenous BID   pantoprazole  40 mg Oral Daily   simvastatin  40 mg Oral QHS   sodium chloride flush  3 mL Intravenous Q12H   umeclidinium bromide  1 puff Inhalation Daily   Continuous Infusions:  sodium chloride     PRN Meds: sodium chloride, acetaminophen, ondansetron (ZOFRAN) IV, sodium chloride flush   Vital Signs    Vitals:   04/16/22 2354 04/17/22 0107 04/17/22 0503 04/17/22 0729  BP: 97/70  100/72 (!) 84/61  Pulse: 68  (!) 55 76  Resp: '16  18 19  '$ Temp: 98.1 F (36.7 C)  98.4 F (36.9 C) 97.9 F (36.6 C)  TempSrc: Oral  Oral Oral  SpO2: 99%  97% 97%  Weight:  91.1 kg    Height:        Intake/Output Summary (Last 24 hours) at 04/17/2022 0903 Last data filed at 04/17/2022 0815 Gross per 24 hour  Intake 953 ml  Output 3275 ml  Net -2322 ml      04/17/2022    1:07 AM 04/16/2022    5:17 AM 04/05/2022   10:23 AM  Last 3 Weights  Weight (lbs) 200 lb 12.8 oz 203 lb 14.4 oz 210 lb 6.4 oz  Weight (kg) 91.082 kg 92.488 kg 95.437 kg      Telemetry    SR with rare PVC - Personally Reviewed  ECG    No new - Personally Reviewed  Physical Exam   GEN: No acute distress.   Neck: JVD by EJ mid 1/3 of the neck at 30 deg..  Cardiac: RRR, 2/6 systolic murmur, no rubs, or gallops.  Respiratory: Clear to auscultation bilaterally. GI: Soft, nontender, non-distended  MS: No edema; No deformity. Neuro:  Nonfocal  Psych: Normal affect   Labs    High Sensitivity Troponin:   Recent Labs  Lab 04/15/22 0933 04/15/22 1128  TROPONINIHS 27* 23*     Chemistry Recent Labs  Lab 04/15/22 0933 04/15/22 1810 04/16/22 0300  04/17/22 0309  NA 134* 135 133* 135  K 3.8 3.3* 3.6 4.0  CL 98  --  97* 97*  CO2 28  --  26 30  GLUCOSE 120*  --  106* 115*  BUN 20  --  22* 18  CREATININE 1.74*  --  1.61* 1.68*  CALCIUM 9.1  --  9.0 9.3  MG  --   --  2.7* 2.6*  GFRNONAA 45*  --  49* 47*  ANIONGAP 8  --  10 8    Lipids No results for input(s): "CHOL", "TRIG", "HDL", "LABVLDL", "LDLCALC", "CHOLHDL" in the last 168 hours.  Hematology Recent Labs  Lab 04/15/22 0933 04/15/22 1810  WBC 4.9  --   RBC 5.79  --   HGB 14.7 16.0  HCT 46.6 47.0  MCV 80.5  --   MCH 25.4*  --   MCHC 31.5  --   RDW 16.2*  --   PLT 158  --    Thyroid No results for input(s): "TSH", "FREET4" in the last 168 hours.  BNP Recent Labs  Lab 04/15/22 0933  BNP 1,220.8*  DDimer No results for input(s): "DDIMER" in the last 168 hours.   Radiology    ECHOCARDIOGRAM COMPLETE  Result Date: 04/16/2022    ECHOCARDIOGRAM REPORT   Patient Name:   Bruce Robertson Date of Exam: 04/16/2022 Medical Rec #:  [id number]        Height:       72.0 in Accession #:    [id number]       Weight:       203.9 lb Date of Birth:  1961-06-11         BSA:          2.148 m Patient Age:    61 years         BP:           107/72 mmHg Patient Gender: M                HR:           64 bpm. Exam Location:  Inpatient Procedure: 2D Echo, Cardiac Doppler, Color Doppler and Intracardiac            Opacification Agent Indications:    CHF  History:        Patient has prior history of Echocardiogram examinations. Prior                 CABG, Arrythmias:Atrial Fibrillation; Risk Factors:Sleep Apnea,                 Hypertension and Diabetes.  Sonographer:    Jyl Heinz Referring Phys: [id number] [place]  1. Left ventricular ejection fraction, by estimation, is <20%. The left ventricle has severely decreased function. The left ventricle demonstrates global hypokinesis. The left ventricular internal cavity size was severely dilated. Left ventricular diastolic parameters are  consistent with Grade III diastolic dysfunction (restrictive). Elevated left atrial pressure.  2. Right ventricular systolic function is moderately reduced. The right ventricular size is moderately enlarged. There is moderately elevated pulmonary artery systolic pressure. The estimated right ventricular systolic pressure is 28.3 mmHg.  3. Left atrial size was severely dilated.  4. Right atrial size was severely dilated.  5. The mitral valve is normal in structure. Trivial mitral valve regurgitation. No evidence of mitral stenosis.  6. The tricuspid valve is abnormal. Tricuspid valve regurgitation is severe.  7. The aortic valve is tricuspid. Aortic valve regurgitation is mild. No aortic stenosis is present.  8. The inferior vena cava is dilated in size with >50% respiratory variability, suggesting right atrial pressure of 8 mmHg. FINDINGS  Left Ventricle: Left ventricular ejection fraction, by estimation, is <20%. The left ventricle has severely decreased function. The left ventricle demonstrates global hypokinesis. Definity contrast agent was given IV to delineate the left ventricular endocardial borders. The left ventricular internal cavity size was severely dilated. There is no left ventricular hypertrophy. Left ventricular diastolic parameters are consistent with Grade III diastolic dysfunction (restrictive). Elevated left atrial pressure. Right Ventricle: The right ventricular size is moderately enlarged. No increase in right ventricular wall thickness. Right ventricular systolic function is moderately reduced. There is moderately elevated pulmonary artery systolic pressure. The tricuspid  regurgitant velocity is 3.34 m/s, and with an assumed right atrial pressure of 8 mmHg, the estimated right ventricular systolic pressure is 66.2 mmHg. Left Atrium: Left atrial size was severely dilated. Right Atrium: Right atrial size was severely dilated. Pericardium: There is no evidence of pericardial effusion. Mitral  Valve: The mitral valve is normal in structure. Trivial mitral valve regurgitation.  No evidence of mitral valve stenosis. Tricuspid Valve: The tricuspid valve is abnormal. Tricuspid valve regurgitation is severe. Aortic Valve: The aortic valve is tricuspid. Aortic valve regurgitation is mild. Aortic regurgitation PHT measures 390 msec. No aortic stenosis is present. Aortic valve peak gradient measures 8.3 mmHg. Pulmonic Valve: The pulmonic valve was grossly normal. Pulmonic valve regurgitation is mild. Aorta: The aortic root and ascending aorta are structurally normal, with no evidence of dilitation. Venous: The inferior vena cava is dilated in size with greater than 50% respiratory variability, suggesting right atrial pressure of 8 mmHg. IAS/Shunts: The interatrial septum was not well visualized.  LEFT VENTRICLE PLAX 2D LVIDd:         7.20 cm      Diastology LVIDs:         6.70 cm      LV e' medial:    4.47 cm/s LV PW:         1.00 cm      LV E/e' medial:  21.5 LV IVS:        0.80 cm      LV e' lateral:   6.31 cm/s LVOT diam:     2.00 cm      LV E/e' lateral: 15.2 LV SV:         40 LV SV Index:   19 LVOT Area:     3.14 cm  LV Volumes (MOD) LV vol d, MOD A2C: 372.0 ml LV vol d, MOD A4C: 302.0 ml LV vol s, MOD A2C: 311.0 ml LV vol s, MOD A4C: 248.0 ml LV SV MOD A2C:     61.0 ml LV SV MOD A4C:     302.0 ml LV SV MOD BP:      64.6 ml RIGHT VENTRICLE            IVC RV Basal diam:  5.20 cm    IVC diam: 2.30 cm RV Mid diam:    4.30 cm RV S prime:     9.37 cm/s TAPSE (M-mode): 1.9 cm LEFT ATRIUM              Index        RIGHT ATRIUM           Index LA diam:        5.50 cm  2.56 cm/m   RA Area:     29.50 cm LA Vol (A2C):   110.0 ml 51.21 ml/m  RA Volume:   116.00 ml 54.00 ml/m LA Vol (A4C):   125.0 ml 58.19 ml/m LA Biplane Vol: 121.0 ml 56.33 ml/m  AORTIC VALVE AV Area (Vmax): 1.63 cm AV Vmax:        144.00 cm/s AV Peak Grad:   8.3 mmHg LVOT Vmax:      74.60 cm/s LVOT Vmean:     52.700 cm/s LVOT VTI:       0.128 m  AI PHT:         390 msec  AORTA Ao Root diam: 3.40 cm Ao Asc diam:  3.50 cm MITRAL VALVE               TRICUSPID VALVE MV Area (PHT): 7.02 cm    TR Peak grad:   44.6 mmHg MV Decel Time: 108 msec    TR Vmax:        334.00 cm/s MR Peak grad: 33.0 mmHg MR Vmax:      287.33 cm/s  SHUNTS MV E velocity: 96.00 cm/s  Systemic VTI:  0.13 m                            Systemic Diam: 2.00 cm Oswaldo Milian MD Electronically signed by Oswaldo Milian MD Signature Date/Time: 04/16/2022/11:36:57 AM    Final    DG Chest 2 View  Result Date: 04/15/2022 CLINICAL DATA:  Worsening shortness of breath for 2 weeks with left of center chest pains. Cough producing brown mucus. EXAM: CHEST - 2 VIEW COMPARISON:  Chest two views 01/22/2022 FINDINGS: Left chest wall AICD with single lead again overlying the right ventricle unchanged. Status post median sternotomy and CABG. Cardiac silhouette is again mildly enlarged. Mediastinal contours are within normal limits. The lungs are clear. No pleural effusion or pneumothorax. Mild-to-moderate multilevel degenerative disc changes of the thoracic spine. IMPRESSION: No significant change from prior. Status post median sternotomy and CABG with mild cardiomegaly. No acute lung process. Electronically Signed   By: Yvonne Kendall M.D.   On: 04/15/2022 10:31    Cardiac Studies   Echo pending, prior echo with severely reduced LVEF 15-20% and basal septal thinning   1. Left ventricular ejection fraction, by estimation, is 15-20%. The left  ventricle has severely decreased function. The left ventricle demonstrates  global hypokinesis. The left ventricular internal cavity size was severely  dilated. Left ventricular  diastolic parameters are indeterminate. Elevated left ventricular  end-diastolic pressure.   2. Right ventricular systolic function is moderately reduced. The right  ventricular size is moderately enlarged. There is severely elevated  pulmonary artery systolic pressure.  The estimated right ventricular  systolic pressure is 29.5 mmHg.   3. Left atrial size was moderately dilated.   4. Right atrial size was moderate-severely dilated.   5. The mitral valve is degenerative. Mild mitral valve regurgitation. No  evidence of mitral stenosis.   6. Tricuspid valve regurgitation is severe.   7. The aortic valve is abnormal. Aortic valve regurgitation is mild. No  aortic stenosis is present.   8. The inferior vena cava is dilated in size with <50% respiratory  variability, suggesting right atrial pressure of 15 mmHg.   Patient Profile     61 y.o. male with a hx of nonischemic cardiomyopathy (EF 15%), status post CABG '07 due to diagnostic cath complicated by left main dissection, status post ICD, paroxysmal atrial fibrillation, Barrett's esophagus, dyslipidemia, OSA and GI bleeding who is being seen for the evaluation of CHF.  Assessment & Plan    NICM/HFrEF/BiV failure: known EF <15%, moderate RV dysfunction on echo from 01/2022 at [facility]. Medications PTA include coreg 6.'25mg'$  BID, Entresto 24-26 BID, Jardiance '10mg'$  daily, spiro '25mg'$  daily which he reports being compliant with.  Now presents with significant volume overload, BNP 1220.  He is volume overloaded on exam with JVD.   -- lactate normal. Warm and wet. -- Plan to add back GDMT as tolerated  - continue lasix IV 40 mg BID for 24 more hours and reassess renal function and symptoms. Doing much better today - hypotension at home. Concern for how successful we will be with ongoing GDMT. Will have HF Toc see patient.  - no indication for inotrope today.  Heart Failure Therapy ACE-I/ARB/ARNI: on hold BB: on hold MRA: on hold SGLT2I: will add jardience 10 mg daily back to his regimen today. Cr: 1.74 > 1.61>1.68 UOP yesterday: 3.3 L Weight: 95.4 on 04/05/22 > 92.5 kg> 91.1 kg Net negative for admission: -4.46 L Diuretic plan: cont lasix IV  bid 40 mg - he is diuresing well on 40 mg IV BID, continue today and reassess  tomorrow particularly with mild elevation in creatinine today. - We discussed in detail his echocardiogram and degree of heart failure. He follows with [place] cardiology and asks if he should inquire about transplantation. I have shared that this is not my area of expertise, and shared information to the best of my ability. We discussed that he is not currently requiring inotropes for diuresis and is functional at home with likely class II symptoms most of the time. I've asked him to discuss further with his heart failure specialist. He plans to call today to get close follow up.    Acute hypoxic respiratory failure, improving. Off bipap and no supp O2.   CAD status post CABG (SVG-LAD, SVG-OM): High-sensitivity troponin 27>> 23.  No anginal symptoms.   S/p MDT ICD: follows at [facility] as well as with Dr. Curt Bears    Valvular heart disease: Moderate TR, mild MR, mild AR on echo 3/23 (at [facility])   Paroxysmal atrial fibrillation: Currently in sinus rhythm --Eliquis 5 mg twice daily   Hyperlipidemia: On statin therapy   CKD stage III: Baseline creatinine 1.6-1.7 -- Follow BMet with diuresis   OSA: On CPAP at home     For questions or updates, please contact [facility] Please consult [email or web address] for contact info under        Signed, Elouise Munroe, MD  04/17/2022, 9:03 AM

## 2022-04-17 NOTE — Progress Notes (Signed)
Mobility Specialist Progress Note:   04/17/22 1037  Mobility  Activity Ambulated with assistance in room  Level of Assistance Independent  Assistive Device None  Distance Ambulated (ft) 30 ft  Activity Response Tolerated well  $Mobility charge 1 Mobility   Pt received in bed willing to participate in mobility. No complaints of pain. Left in bed with call bell in reach and all needs met.   [facility] Jaxson Anglin Mobility Specialist

## 2022-04-17 NOTE — Progress Notes (Signed)
Heart Failure Nurse Navigator Progress Note  PCP: Caren Macadam, MD (Inactive) PCP-Cardiologist: Hochrein, also seen by [facility]. Admission Diagnosis: Acute on chronic congestive heart failure, hypoxia, elevated troponin.  Admitted from: Home  Presentation:   Bruce Robertson presented with shortness of breath x 2 weeks, stated his diet hasn't been well, recently ate large [place] legs and drinks pepsi. Patient states he is compliant with his medications. BP 95/78, HR 54,  Breathing treatments given, lasix 20 mg IV x1. Placed on Bipap. BNP 1220, Troponin 27.   Patient is a patient with [facility], has been there for 2 years. Patient and fiance were educated with HF book on the sign and symptoms , daily weights, when to call his doctor or go to the ER. Education on diet/ fluid restrictions, especially salt substitute. The importance of taking all his medications and attending medical appointments. They both asked a number of questions and verbalized their understanding of the education. HF TOC follow up appointment scheduled for 04/24/22 @ 3 pm.   ECHO/ LVEF: 15-20% G3DD  Clinical Course:  Past Medical History:  Diagnosis Date   AICD (automatic cardioverter/defibrillator) present    Anxiety 08/20/2016   Barrett's esophagus 2002   Cardiomyopathy    Nonischemic. EF has been about 45%. S/P CABG.;  b.  Echo 4/14: EF 25%, global HK with inf and mid apical AK, restrictive physiology with E/e' > 15 (elevated LV filling pressure), trivial AI/MR, mild to mod LAE, mild RVE, mild reduced RVSF, mild RAE, mod TR, PASP 74 (severe pulmonary HTN)    CHF (congestive heart failure) (HCC)    CHF exacerbation (HCC) 07/31/2016   Colon polyps 2002   Coronary artery disease    Diabetes mellitus without complication (HCC)    Type II   Dyslipidemia    hx (03/20/2017)   GERD (gastroesophageal reflux disease)    History of bleeding peptic ulcer    History of echocardiogram    Echo 10/16:  EF 25-30%, poss  non-compaction, diff HK with inf-lat and apical HK, restrictive physio, mild AI, severe LAE, mild RVE with mild reduced RVSF, PASP 42 mmHg   Hyperplastic rectal polyp    Moderate episode of recurrent major depressive disorder ([place]) 08/20/2016   09/14/20 - not current   OSA on CPAP    Mild   Pneumonia 09/2018   Tobacco abuse    Remote     Social History   Socioeconomic History   Marital status: Significant Other    Spouse name: Not on file   Number of children: 5   Years of education: Not on file   Highest education level: High school graduate  Occupational History   Occupation: Civil engineer, contracting    Employer: [employer]    Comment: Disability 4 years  Tobacco Use   Smoking status: Former    Packs/day: 0.25    Years: 7.00    Total pack years: 1.75    Types: Cigarettes   Smokeless tobacco: Never   Tobacco comments:    03/20/2017 "quit in 1997  Vaping Use   Vaping Use: Former   Substances: THC   Devices: vaped for about a month  Substance and Sexual Activity   Alcohol use: Not Currently    Comment: socially   Drug use: Yes    Frequency: 1.0 times per week    Types: Marijuana   Sexual activity: Yes  Other Topics Concern   Not on file  Social History Narrative   Single  Social Determinants of Health   Financial Resource Strain: Low Risk  (01/29/2022)   Overall Financial Resource Strain (CARDIA)    Difficulty of Paying Living Expenses: Not very hard  Food Insecurity: No Food Insecurity (04/17/2022)   Hunger Vital Sign    Worried About Running Out of Food in the Last Year: Never true    Ran Out of Food in the Last Year: Never true  Transportation Needs: No Transportation Needs (04/17/2022)   PRAPARE - Hydrologist (Medical): No    Lack of Transportation (Non-Medical): No  Physical Activity: Inactive (11/20/2021)   Exercise Vital Sign    Days of Exercise per Week: 0 days    Minutes of Exercise per Session: 0 min  Stress: No Stress  Concern Present (11/20/2021)   [place]    Feeling of Stress : Only a little  Social Connections: Moderately Isolated (11/14/2020)   Social Connection and Isolation Panel [NHANES]    Frequency of Communication with Friends and Family: More than three times a week    Frequency of Social Gatherings with Friends and Family: Never    Attends Religious Services: More than 4 times per year    Active Member of [employer] or Organizations: No    Attends Music therapist: Never    Marital Status: Divorced   Teacher, early years/pre and Provision:  Detailed education and instructions provided on heart failure disease management including the following:  Signs and symptoms of Heart Failure When to call the physician Importance of daily weights Low sodium diet Fluid restriction Medication management Anticipated future follow-up appointments  Patient education given on each of the above topics.  Patient acknowledges understanding via teach back method and acceptance of all instructions.  Education Materials:  "Living Better With Heart Failure" Booklet, HF zone tool, & Daily Weight Tracker Tool.  Patient has scale at home: yes Patient has pill box at home: yes    High Risk Criteria for Readmission and/or Poor Patient Outcomes: Heart failure hospital admissions (last 6 months): 1  No Show rate: 7 % Difficult social situation: No Demonstrates medication adherence: yes Primary Language: English Literacy level: Reading, writing, and comprehension.  Barriers of Care:   Diet/ fluid restrictions  ([place] legs/ pepsi)  Considerations/Referrals:   Referral made to Heart Failure Pharmacist Stewardship: yes Referral made to Heart Failure CSW/NCM TOC: no Referral made to Heart & Vascular TOC clinic: yes, 04/24/22 @ 3 pm  Items for Follow-up on DC/TOC: Diet/ fluid restrictions ([place] legs/ Pepsi)   Earnestine Leys, BSN,  RN Heart Failure Transport planner Only

## 2022-04-17 NOTE — Progress Notes (Signed)
Received a message from nurse that patient was hypotensive and complaining of dizziness/lightheadedness.  I went to bedside and patient was lying comfortably in bed.  He states that he got up and as normal to go to the bathroom.  While standing in the bathroom for a few minutes he noted a tingling sensation in his head, felt warm and immediately felt a wave of tiredness.  He was able to walk back to his bed and lie down.  He did not fall or have a syncopal episode.  He denies chest pain or shortness of breath.  Orthostatics was obtained and was negative.  Repeat blood pressure 95/72 with a MAP of 81.  Roughly unchanged from prior readings.  On exam, heart sounds normal, lungs clear to auscultation, extremities warm and well-perfused.  Patient states he was feeling a bit better after lying back in bed.  Suspecting this may be vasovagal in nature.  Patient has already received his first dose of IV Lasix this morning and due for second dose around 6 PM.   P: -Strict I's and O's -If patient becomes symptomatic again; consider holding evening Lasix dose

## 2022-04-18 DIAGNOSIS — I5043 Acute on chronic combined systolic (congestive) and diastolic (congestive) heart failure: Secondary | ICD-10-CM

## 2022-04-18 LAB — BASIC METABOLIC PANEL
Anion gap: 12 (ref 5–15)
BUN: 19 mg/dL (ref 6–20)
CO2: 24 mmol/L (ref 22–32)
Calcium: 9.4 mg/dL (ref 8.9–10.3)
Chloride: 99 mmol/L (ref 98–111)
Creatinine, Ser: 1.68 mg/dL — ABNORMAL HIGH (ref 0.61–1.24)
GFR, Estimated: 47 mL/min — ABNORMAL LOW (ref >60)
Glucose, Bld: 100 mg/dL — ABNORMAL HIGH (ref 70–99)
Potassium: 4 mmol/L (ref 3.5–5.1)
Sodium: 135 mmol/L (ref 135–145)

## 2022-04-18 LAB — MAGNESIUM: Magnesium: 2.4 mg/dL (ref 1.7–2.4)

## 2022-04-18 NOTE — Consult Note (Signed)
   [facility] CM Inpatient Consult   04/18/2022  Bruce Robertson 04/20/1961 [id number]  [employer] [ACO] Patient: [name]  Primary Care Provider:  Micheline Rough, MD, [name]  is an embedded provider with a Chronic Care Management team and program, and is listed for the transition of care follow up and appointments.  Patient was screened in EMR for Embedded practice service needs for post hospital care coordination. Reviewed hospital progress notes for post hospital follow up needs for transitional barriers to care. Noted encounters from provider CCM team.  1:30 pm Patient noted to have post hospital [name] appointment  Plan:  No additional needs for [facility] for post hospital care needs at this time.  Please contact for further questions,  Bruce Brood, RN BSN [facility] Liaison  [phone] business mobile phone Toll free office [phone]  Fax number: [phone] [email or web address]

## 2022-04-18 NOTE — TOC Transition Note (Signed)
Transition of Care [facility]) - CM/SW Discharge Note   Patient Details  Name: Bruce Robertson MRN: [id number] Date of Birth: Aug 25, 1961  Transition [facility]) CM/SW Contact:  Zenon Mayo, RN Phone Number: 04/18/2022, 12:21 PM   Clinical Narrative:    Patient is for dc today, he states Cecille Po will transport him home and she is on the way.  He has no needs.          Patient Goals and CMS Choice        Discharge Placement                       Discharge Plan and Services                                     Social Determinants of Health (SDOH) Interventions Food Insecurity Interventions: Intervention Not Indicated Housing Interventions: Intervention Not Indicated Transportation Interventions: Intervention Not Indicated   Readmission Risk Interventions     No data to display

## 2022-04-18 NOTE — Progress Notes (Signed)
Heart Failure Stewardship Pharmacist Progress Note   PCP: Caren Macadam, MD (Inactive) PCP-Cardiologist: Minus Breeding, MD    HPI:  61 yo M with PMH of NICM, CAD s/p CABG, s/p ICD for primary prevention, afib, Barrett's esophagus, HLD, OSA, and GI bleeding. He presented to the ED on 6/12 with 2 weeks of shortness of breath, decrease in urine output, weight gain (dry weight 197l lbs), and chest discomfort.  He is followed by [facility] and [facility] team as outpatient. 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 EF at that time dropped from 40% to 20-25%. CPX in March 2023 with moderate functional impairment - coreg changed to metoprolol but he became more fatigued so this was switched back to coreg.  In the ED, his CXR didn't show any edema. ECHO on 6/13 with EF <20%, G3DD, RV moderately reduced with moderate PAP. Also noted to have trivial MR, mild AR, and severe TR. GDMT has been held with hypotension.   Discharge HF Medications: Diuretic: torsemide 40 mg daily Beta Blocker: carvedilol 6.25 mg BID SGLT2i: Jardiance 10 mg daily  Prior to admission HF Medications: Diuretic: torsemide 40 mg daily Beta blocker: carvedilol 6.25 mg BID ACE/ARB/ARNI: Entresto 24/26 mg BID Aldosterone Antagonist: spironolactone 25 mg daily SGLT2i: Jardiance 10 mg daily  Pertinent Lab Values: Serum creatinine 1.68, BUN 19, Potassium 4.0, Sodium 135, BNP 1220.8, Magnesium 2.4, A1c 7.0   Vital Signs: Weight: 194 lbs (admission weight: 203 lbs) Blood pressure: 90-100/70s  Heart rate: 70s  I/O: -1.4L yesterday; net -6.5L  Medication Assistance / Insurance Benefits Check: Does the patient have prescription insurance?  Yes Type of insurance plan: Office manager  Outpatient Pharmacy:  Prior to admission outpatient pharmacy: [facility] Is the patient willing to use [facility] at discharge? Yes Is the patient willing to transition their  outpatient pharmacy to utilize a [facility] outpatient pharmacy?   Pending    Assessment: 1. Acute on chronic systolic CHF (LVEF <56%), due to NICM. NYHA class III symptoms. - Volume improved. Weight down. Good UOP. Agree with transitioning back to PTA torsemide 40 mg daily - Resumed carvedilol 6.25 on discharge   - Holding Entresto and spironolactone with hypotension - Continue Jardiance 10 mg daily   Plan: 1) Medication changes recommended at this time: - Discharge today  2) Patient assistance: - Has commercial insurance  3)  Education  - Patient has been educated on current HF medications and potential additions to HF medication regimen - Patient verbalizes understanding that over the next few months, these medication doses may change and more medications may be added to optimize HF regimen - Patient has been educated on basic disease state pathophysiology and goals of therapy   Kerby Nora, PharmD, BCPS Heart Failure Cytogeneticist Phone [phone]

## 2022-04-18 NOTE — Progress Notes (Signed)
Patient discharged in stable condition. All questions and concerns addressed. Education completed with evidence of understanding.

## 2022-04-18 NOTE — Progress Notes (Signed)
   Patient was seen briefly before he left the hospital. He was able to explain to me that he was instructed to restart his usual home dose of carvedilol, torsemide, and jardiance and to hold off on taking entresto and spironolactone. He understands that he is to follow up with his primary cardiologist at [facility]. I agree with this plan.   I also discussed with him the benefits of having a blood pressure cuff at home and taking BP regularly and when he has symptoms such as lightheadedness/dizziness. Patient voiced understanding and plans to buy a BP cuff at [facility].   Margie Billet, PA-C 04/18/2022 12:33 PM

## 2022-04-18 NOTE — Care Management Important Message (Signed)
Important Message  Patient Details  Name: Bruce Robertson MRN: [id number] Date of Birth: 1961/09/22   Medicare Important Message Given:  Yes     Shelda Altes 04/18/2022, 7:49 AM

## 2022-04-18 NOTE — Discharge Instructions (Signed)
1.  Please be sure to follow-up with cardiology in the upcoming week. 2.  Do not take Entresto and spironolactone until you are seen by cardiology 3.  You can continue taking torsemide '40mg'$  daily and carvedilol 6.25 mg twice daily.

## 2022-04-18 NOTE — Discharge Summary (Addendum)
Name: Bruce Robertson MRN: [id number] DOB: [birth year] 61 y.o. PCP: Caren Macadam, MD (Inactive)  Date of Admission: 04/15/2022  9:09 AM Date of Discharge: 04/18/22 /Attending Physician: Dr. Philipp Ovens  Discharge Diagnosis: Principal Problem:   CHF exacerbation ([place]) Active Problems:   Cardiomyopathy, dilated, nonischemic (HCC)   GERD (gastroesophageal reflux disease)   OSA treated with BiPAP   Stage 3 chronic kidney disease (HCC)   Paroxysmal atrial fibrillation ([place])   Type 2 diabetes mellitus with complication, without long-term current use of insulin ([place])    Discharge Medications: Allergies as of 04/18/2022   No Known Allergies      Medication List     STOP taking these medications    Entresto 24-26 MG Generic drug: sacubitril-valsartan   sildenafil 50 MG tablet Commonly known as: VIAGRA   spironolactone 25 MG tablet Commonly known as: ALDACTONE       TAKE these medications    Accu-Chek Guide test strip Generic drug: glucose blood Use as directed-patient needs an appt   Accu-Chek Softclix Lancets lancets Use as directed   AeroChamber Plus inhaler Use as instructed   albuterol 108 (90 Base) MCG/ACT inhaler Commonly known as: VENTOLIN HFA Inhale 2 puffs into the lungs every 6 (six) hours as needed for wheezing or shortness of breath.   blood glucose meter kit and supplies Kit Dispense based on patient and insurance preference. Use up to four times daily as directed.   Breztri Aerosphere 160-9-4.8 MCG/ACT Aero Generic drug: Budeson-Glycopyrrol-Formoterol Inhale 2 puffs into the lungs 2 (two) times daily.   carvedilol 6.25 MG tablet Commonly known as: COREG Take 1 tablet (6.25 mg total) by mouth 2 (two) times daily.   Eliquis 5 MG Tabs tablet Generic drug: apixaban Take 1 tablet by mouth twice daily   Jardiance 10 MG Tabs tablet Generic drug: empagliflozin Take 1 tablet by mouth once daily   omeprazole 40 MG capsule Commonly known as:  PRILOSEC Take 40 mg by mouth daily.   simvastatin 40 MG tablet Commonly known as: ZOCOR TAKE 1 TABLET BY MOUTH AT BEDTIME   torsemide 20 MG tablet Commonly known as: DEMADEX Take 2 tablets (40 mg total) by mouth daily.        Disposition and follow-up:   [name] was discharged from [facility] in Stable condition.  At the hospital follow up visit please address:  1.  Follow-up: a.  CHF exacerbation/Nonischemic cardiomyopathy with reduced EF 15 to 14% and diastolic dysfunction/biventricular heart failure: Patient was hypotensive throughout hospitalization, there was a concern for low output heart failure.  GDMT regimen was held.  Following diuresis, patient was back at baseline.  Able to restart torsemide, carvedilol, and Jardiance.  We will continue to hold spironolactone and Entresto until patient follows up with cardiology for reevaluation.    b.   c.   d.  2.  Labs / imaging needed at time of follow-up: BMP  3.  Pending labs/ test needing follow-up: None  4.  Medication Changes  Started:  Stopped: Entresto; spironolactone  Changed:  Follow-up Appointments:  Follow-up Information     [facility]. Go in 6 day(s).   Specialty: Cardiology Why: [facility]osp[facility] Follow up PLEASE bring a current medication llist to appointment FREE valet parking, Entrance C, off of [employer] information: [address] [id number] [place] [address] [phone]        Caren Macadam, MD. Go on 04/24/2022.  Specialty: Family Medicine Why: @11 :30am Contact information: [place] [address] [phone]         Constance Haw, MD .   Specialty: Cardiology Contact information: [address] [place] [address] [phone]         Minus Breeding, MD .   Specialty: Cardiology Contact information: [address] [place] [address] [place] Hospital Course by problem list:  Bruce Robertson is 61 year old male with NICM with reduced ejection fraction of 15 to 92% and diastolic dysfunction presenting for progressive shortness of breath over the past 2 weeks found to be in a heart failure exacerbation complicated by acute hypoxic respiratory failure requiring NIPPV.   CHF exacerbation Nonischemic cardiomyopathy with reduced EF 15 to 11% and diastolic dysfunction/biventricular heart failure Patient presented to the ED complaining of progressive shortness of breath and was admitted for CHF exacerbation.  BNP elevated at 1200.  High-sensitivity troponins were flat.  Chest x-ray negative for active cardiopulmonary disease.  On exam, JVD present, lungs were clear to auscultation and lower extremity were warm and well-perfused.  Patient was noted to be hypotensive SBP in the 80s and 90s.  There was concern for low output heart failure.  Cardiology was consulted.  Patient's GDMT was held in the setting of hypotension.  Assessment consistent with hypervolemic state and patient was started on IV Lasix 40 mg twice daily.  Patient tolerated diuresis well.  Patient did have an episode of symptomatic hypotension; orthostatics were obtained and were negative.  No abnormal changes on cardiac telemetry.  IV Lasix was discontinued and patient was transitioned to oral diuretics.  GDMT regimen was slowly added back, restarted torsemide, carvedilol and Jardiance; continued holding spironolactone and Entresto.  Patient is stable and back to dry weight.  Patient is to follow-up with [facility] cardiology at time of discharge.  Paroxysmal A-fib Patient was placed on cardiac telemetry throughout hospitalization.  Patient remained in normal sinus rhythm.  Home medication Eliquis 5 mg twice daily was continued throughout hospitalization.  Type 2 diabetes mellitus Home medication includes Jardiance  10 mg daily.  A1c assessed on admission was 7%.  CBG goal 140-1 80 in which patient maintained at goal glucose range throughout hospitalization.  Patient was restarted on home medication Jardiance throughout hospitalization.  CKD stage IIIa Baseline creatinine 1.6-1.7.  BMP was trended throughout hospitalization as patient was being diuresed.  Patient tolerated diuresis well.  Creatinine levels remained stable around baseline.  Mild hyponatremia, resolved On admission, sodium levels mildly decreased at 133.  This was noted following 1 dose of Lasix given in the ED.  Patient was asymptomatic.  With continued diuresis for hypovolemic state, patient sodium levels normalized.  OSA Patient was continued on CPAP at bedtime.  GERD Patient was continued on Protonix 40 mg daily throughout hospitalization.    Discharge Subjective:   Discharge Exam:   BP 102/78 (BP Location: Left Arm)   Pulse 77   Temp 98 F (36.7 C) (Oral)   Resp 18   Ht 6' (1.829 m)   Wt 88.3 kg   SpO2 97%   BMI 26.39 kg/m  Constitutional: well-appearing gentleman sitting up in bed, in no acute distress HENT: normocephalic atraumatic, mucous membranes moist Eyes: conjunctiva non-erythematous Neck: supple Cardiovascular: regular rate and rhythm, no m/r/g Pulmonary/Chest: normal work of breathing on room air, lungs clear to auscultation bilaterally  Abdominal: soft, non-tender, non-distended MSK: normal bulk and tone Neurological: alert & oriented x 3, 5/5 strength in bilateral upper and lower extremities, normal gait Skin: warm and dry Psych: Normal mood, normal behavior   Pertinent Labs, Studies, and Procedures:     Latest Ref Rng & Units 04/15/2022    6:10 PM 04/15/2022    9:33 AM 05/30/2021    8:48 AM  CBC  WBC 4.0 - 10.5 K/uL  4.9  10.4   Hemoglobin 13.0 - 17.0 g/dL 16.0  14.7  12.6   Hematocrit 39.0 - 52.0 % 47.0  46.6  40.0   Platelets 150 - 400 K/uL  158  159.0        Latest Ref Rng & Units 04/18/2022     4:02 AM 04/17/2022    3:09 AM 04/16/2022    3:00 AM  CMP  Glucose 70 - 99 mg/dL 100  115  106   BUN 6 - 20 mg/dL 19  18  22    Creatinine 0.61 - 1.24 mg/dL 1.68  1.68  1.61   Sodium 135 - 145 mmol/L 135  135  133   Potassium 3.5 - 5.1 mmol/L 4.0  4.0  3.6   Chloride 98 - 111 mmol/L 99  97  97   CO2 22 - 32 mmol/L 24  30  26    Calcium 8.9 - 10.3 mg/dL 9.4  9.3  9.0     ECHOCARDIOGRAM COMPLETE  Result Date: 04/16/2022    ECHOCARDIOGRAM REPORT   Patient Name:   Bruce Robertson Date of Exam: 04/16/2022 Medical Rec #:  [id number]        Height:       72.0 in Accession #:    [id number]       Weight:       203.9 lb Date of Birth:  July 02, 1961         BSA:          2.148 m Patient Age:    15 years         BP:           107/72 mmHg Patient Gender: M                HR:           64 bpm. Exam Location:  Inpatient Procedure: 2D Echo, Cardiac Doppler, Color Doppler and Intracardiac            Opacification Agent Indications:    CHF  History:        Patient has prior history of Echocardiogram examinations. Prior                 CABG, Arrythmias:Atrial Fibrillation; Risk Factors:Sleep Apnea,                 Hypertension and Diabetes.  Sonographer:    Jyl Heinz Referring Phys: [id number] [place]  1. Left ventricular ejection fraction, by estimation, is <20%. The left ventricle has severely decreased function. The left ventricle demonstrates global hypokinesis. The left ventricular internal cavity size was severely dilated. Left ventricular diastolic parameters are consistent with Grade III diastolic dysfunction (restrictive). Elevated left atrial pressure.  2. Right ventricular systolic function is moderately reduced. The right ventricular size is moderately enlarged. There is moderately elevated pulmonary artery systolic pressure. The estimated right ventricular systolic pressure is 95.2 mmHg.  3. Left atrial size was severely dilated.  4. Right atrial size was severely dilated.  5. The mitral  valve  is normal in structure. Trivial mitral valve regurgitation. No evidence of mitral stenosis.  6. The tricuspid valve is abnormal. Tricuspid valve regurgitation is severe.  7. The aortic valve is tricuspid. Aortic valve regurgitation is mild. No aortic stenosis is present.  8. The inferior vena cava is dilated in size with >50% respiratory variability, suggesting right atrial pressure of 8 mmHg. FINDINGS  Left Ventricle: Left ventricular ejection fraction, by estimation, is <20%. The left ventricle has severely decreased function. The left ventricle demonstrates global hypokinesis. Definity contrast agent was given IV to delineate the left ventricular endocardial borders. The left ventricular internal cavity size was severely dilated. There is no left ventricular hypertrophy. Left ventricular diastolic parameters are consistent with Grade III diastolic dysfunction (restrictive). Elevated left atrial pressure. Right Ventricle: The right ventricular size is moderately enlarged. No increase in right ventricular wall thickness. Right ventricular systolic function is moderately reduced. There is moderately elevated pulmonary artery systolic pressure. The tricuspid  regurgitant velocity is 3.34 m/s, and with an assumed right atrial pressure of 8 mmHg, the estimated right ventricular systolic pressure is 76.2 mmHg. Left Atrium: Left atrial size was severely dilated. Right Atrium: Right atrial size was severely dilated. Pericardium: There is no evidence of pericardial effusion. Mitral Valve: The mitral valve is normal in structure. Trivial mitral valve regurgitation. No evidence of mitral valve stenosis. Tricuspid Valve: The tricuspid valve is abnormal. Tricuspid valve regurgitation is severe. Aortic Valve: The aortic valve is tricuspid. Aortic valve regurgitation is mild. Aortic regurgitation PHT measures 390 msec. No aortic stenosis is present. Aortic valve peak gradient measures 8.3 mmHg. Pulmonic Valve: The pulmonic valve  was grossly normal. Pulmonic valve regurgitation is mild. Aorta: The aortic root and ascending aorta are structurally normal, with no evidence of dilitation. Venous: The inferior vena cava is dilated in size with greater than 50% respiratory variability, suggesting right atrial pressure of 8 mmHg. IAS/Shunts: The interatrial septum was not well visualized.  LEFT VENTRICLE PLAX 2D LVIDd:         7.20 cm      Diastology LVIDs:         6.70 cm      LV e' medial:    4.47 cm/s LV PW:         1.00 cm      LV E/e' medial:  21.5 LV IVS:        0.80 cm      LV e' lateral:   6.31 cm/s LVOT diam:     2.00 cm      LV E/e' lateral: 15.2 LV SV:         40 LV SV Index:   19 LVOT Area:     3.14 cm  LV Volumes (MOD) LV vol d, MOD A2C: 372.0 ml LV vol d, MOD A4C: 302.0 ml LV vol s, MOD A2C: 311.0 ml LV vol s, MOD A4C: 248.0 ml LV SV MOD A2C:     61.0 ml LV SV MOD A4C:     302.0 ml LV SV MOD BP:      64.6 ml RIGHT VENTRICLE            IVC RV Basal diam:  5.20 cm    IVC diam: 2.30 cm RV Mid diam:    4.30 cm RV S prime:     9.37 cm/s TAPSE (M-mode): 1.9 cm LEFT ATRIUM              Index  RIGHT ATRIUM           Index LA diam:        5.50 cm  2.56 cm/m   RA Area:     29.50 cm LA Vol (A2C):   110.0 ml 51.21 ml/m  RA Volume:   116.00 ml 54.00 ml/m LA Vol (A4C):   125.0 ml 58.19 ml/m LA Biplane Vol: 121.0 ml 56.33 ml/m  AORTIC VALVE AV Area (Vmax): 1.63 cm AV Vmax:        144.00 cm/s AV Peak Grad:   8.3 mmHg LVOT Vmax:      74.60 cm/s LVOT Vmean:     52.700 cm/s LVOT VTI:       0.128 m AI PHT:         390 msec  AORTA Ao Root diam: 3.40 cm Ao Asc diam:  3.50 cm MITRAL VALVE               TRICUSPID VALVE MV Area (PHT): 7.02 cm    TR Peak grad:   44.6 mmHg MV Decel Time: 108 msec    TR Vmax:        334.00 cm/s MR Peak grad: 33.0 mmHg MR Vmax:      287.33 cm/s  SHUNTS MV E velocity: 96.00 cm/s  Systemic VTI:  0.13 m                            Systemic Diam: 2.00 cm Bruce Milian MD Electronically signed by Bruce Milian MD Signature Date/Time: 04/16/2022/11:36:57 AM    Final    DG Chest 2 View  Result Date: 04/15/2022 CLINICAL DATA:  Worsening shortness of breath for 2 weeks with left of center chest pains. Cough producing brown mucus. EXAM: CHEST - 2 VIEW COMPARISON:  Chest two views 01/22/2022 FINDINGS: Left chest wall AICD with single lead again overlying the right ventricle unchanged. Status post median sternotomy and CABG. Cardiac silhouette is again mildly enlarged. Mediastinal contours are within normal limits. The lungs are clear. No pleural effusion or pneumothorax. Mild-to-moderate multilevel degenerative disc changes of the thoracic spine. IMPRESSION: No significant change from prior. Status post median sternotomy and CABG with mild cardiomegaly. No acute lung process. Electronically Signed   By: Yvonne Kendall M.D.   On: 04/15/2022 10:31     Discharge Instructions: Discharge Instructions     Call MD for:  difficulty breathing, headache or visual disturbances   Complete by: As directed    Call MD for:  extreme fatigue   Complete by: As directed    Call MD for:  hives   Complete by: As directed    Call MD for:  persistant dizziness or light-headedness   Complete by: As directed    Call MD for:  persistant nausea and vomiting   Complete by: As directed    Call MD for:  redness, tenderness, or signs of infection (pain, swelling, redness, odor or green/yellow discharge around incision site)   Complete by: As directed    Call MD for:  severe uncontrolled pain   Complete by: As directed    Call MD for:  temperature >100.4   Complete by: As directed    Diet - low sodium heart healthy   Complete by: As directed    Increase activity slowly   Complete by: As directed        Signed: Timothy Lasso, MD Internal Medicine Resident Pager: [phone]

## 2022-04-19 NOTE — Progress Notes (Signed)
Remote check per Dr. Rosezella [place] request. Was scheduled for today however transmission has not been received. Patient hospitalized 6/12 to 6/15 with acute on chronic HF. CLS during hospitalization noted below.

## 2022-04-23 ENCOUNTER — Telehealth: Payer: Self-pay | Admitting: Pharmacist

## 2022-04-23 NOTE — Chronic Care Management (AMB) (Addendum)
Chronic Care Management Pharmacy Assistant   Name: Bruce Robertson  MRN: [id number] DOB: [birth year]  Reason for Encounter: Disease State / Hypertension Assessment Call   Conditions to be addressed/monitored: HTN  Recent office visits:  None  Recent consult visits:  04/05/2022 Minus Breeding MD (cardiology) - Patient was seen for Chronic combined systolic and diastolic heart failure and additional issues. Decreased Carvedilol to 6.25 mg twice daily. Discontinued Metoprolol. Follow up in 6 months.  03/12/2022 [name] MD(cardiology) - Patient was seen for Chronic systolic CHF (congestive heart failure) and an additional issue. Started Metoprolol 50 mg daily. Discontinued Carvedilol. Follow up in 6 months.  02/27/2022 Kara Mead (pulmonary) - Patient was seen for Obstructive sleep apnea. No additional chart notes.   02/14/2022 Roma Kayser RN [facility]) - Patient was seen for Cardiomyopathy, dilated, nonischemic and an additional issue. No medication changes. No follow up noted.   Hospital visits:  Admitted to [facility] on 04/15/2022 due to CHF exacerbation. Discharge date was 04/18/2022.    New?Medications Started at [facility] Discharge:?? No new medications Medication Changes at Hospital Discharge: No medication changes Medications Discontinued at Hospital Discharge: Entresto 24-26 MG (sacubitril-valsartan) sildenafil 50 MG tablet (VIAGRA) spironolactone 25 MG tablet (ALDACTONE) Medications that remain the same after Hospital Discharge:??  -All other medications will remain the same.    Medications: Outpatient Encounter Medications as of 04/23/2022  Medication Sig   ACCU-CHEK GUIDE test strip Use as directed-patient needs an appt   Accu-Chek Softclix Lancets lancets Use as directed   albuterol (VENTOLIN HFA) 108 (90 Base) MCG/ACT inhaler Inhale 2 puffs into the lungs every 6 (six) hours as needed for wheezing or shortness of breath.   blood  glucose meter kit and supplies KIT Dispense based on patient and insurance preference. Use up to four times daily as directed.   Budeson-Glycopyrrol-Formoterol (BREZTRI AEROSPHERE) 160-9-4.8 MCG/ACT AERO Inhale 2 puffs into the lungs 2 (two) times daily.   carvedilol (COREG) 6.25 MG tablet Take 1 tablet (6.25 mg total) by mouth 2 (two) times daily.   ELIQUIS 5 MG TABS tablet Take 1 tablet by mouth twice daily   JARDIANCE 10 MG TABS tablet Take 1 tablet by mouth once daily   omeprazole (PRILOSEC) 40 MG capsule Take 40 mg by mouth daily.   simvastatin (ZOCOR) 40 MG tablet TAKE 1 TABLET BY MOUTH AT BEDTIME   Spacer/Aero-Holding Chambers (AEROCHAMBER PLUS) inhaler Use as instructed   torsemide (DEMADEX) 20 MG tablet Take 2 tablets (40 mg total) by mouth daily.   No facility-administered encounter medications on file as of 04/23/2022.  Fill History: ALBUTEROL HFA 90MCG (18GM) AER 11/30/2021 25   ELIQUIS 5MG         TAB 03/18/2022 90   CARVEDILOL 6.25MG   TAB 04/05/2022 90   JARDIANCE 10MG TAB 02/12/2022 90   FLUTICASONE 50MCG   SPR 01/22/2022 60   OMEPRAZOLE DR 40MG  CAP 02/12/2022 90   SIMVASTATIN 40MG    TAB 03/21/2022 90   TORSEMIDE 20MG      TAB 03/19/2022 90    Reviewed chart prior to disease state call. Spoke with patient regarding BP  Recent Office Vitals: BP Readings from Last 3 Encounters:  04/18/22 93/74  04/05/22 (!) 86/54  03/12/22 (!) 88/52   Pulse Readings from Last 3 Encounters:  04/18/22 84  04/05/22 75  03/12/22 (!) 53    Wt Readings from Last 3 Encounters:  04/18/22 194 lb 9.6 oz (88.3 kg)  04/05/22 210  lb 6.4 oz (95.4 kg)  03/12/22 214 lb 6.4 oz (97.3 kg)     Kidney Function Lab Results  Component Value Date/Time   CREATININE 1.68 (H) 04/18/2022 04:02 AM   CREATININE 1.68 (H) 04/17/2022 03:09 AM   CREATININE 1.60 (H) 02/11/2020 04:41 PM   CREATININE 1.45 (H) 01/23/2017 10:52 AM   GFR 47.04 (L) 01/03/2022 08:22 AM   GFRNONAA 47 (L) 04/18/2022 04:02  AM   GFRAA 42 (L) 07/24/2020 11:07 AM       Latest Ref Rng & Units 04/18/2022    4:02 AM 04/17/2022    3:09 AM 04/16/2022    3:00 AM  BMP  Glucose 70 - 99 mg/dL 100  115  106   BUN 6 - 20 mg/dL 19  18  22   Creatinine 0.61 - 1.24 mg/dL 1.68  1.68  1.61   Sodium 135 - 145 mmol/L 135  135  133   Potassium 3.5 - 5.1 mmol/L 4.0  4.0  3.6   Chloride 98 - 111 mmol/L 99  97  97   CO2 22 - 32 mmol/L 24  30  26   Calcium 8.9 - 10.3 mg/dL 9.4  9.3  9.0     Current antihypertensive regimen:  Carvedilol 6.25 mg twice daily  How often are you checking your Blood Pressure? Patient is checking his blood pressures daily.   Current home BP readings: Patient states his last two readings were 04/22/22 104/75 and 04/21/22 109/65.  He states his readings are always between 100/65 and 115/75  What recent interventions/DTPs have been made by any provider to improve Blood Pressure control since last CPP Visit: Decreased Carvedilol to 6.25 mg twice daily. Discontinued Metoprolol. On 04/05/2022.  Any recent hospitalizations or ED visits since last visit with CPP? Admitted to [facility] on 04/15/2022 due to CHF exacerbation. Discharge date was 04/18/2022.   What diet changes have been made to improve Blood Pressure Control?  Patient follows low sodium Breakfast - patient will have eggs with pancakes or grilled chicken with rice Lunch - patient will have a salad Dinner - patient will have a meat (chicken, fish or beef) and vegetables. He eats little to no bread and pasta's  What exercise is being done to improve your Blood Pressure Control?  He was going to the gym prior to daily until he was hospitalized, he plans to go back to the gym when he is recovered.   Note: Patient states he is doing fine since hospital stay, he states he is fatigued and still a little winded with any activity, his activity is very limited due to this. He has a hospital follow up visit with Padonda Webb tomorrow,  04/24/22.  Adherence Review: Is the patient currently on ACE/ARB medication? No Does the patient have >5 day gap between last estimated fill dates? No  Care Gaps: AWV - scheduled 11/26/2022 Last BP - 93/74 on 04/18/2022 Last A1C - 7.0 on 04/15/2022 Foot exam - never done Eye exam - never done Shingrix - never done Covid booster - overdue Tdap - postponed  Star Rating Drugs: Jardiance 10 mg - last filled 02/12/2022 90 DS at [facility] Simvastatin 40 mg - last filled 03/21/2022 90 DS at [facility]  Janie Johnson CMA  Clinical Pharmacist Assistant [phone]  

## 2022-04-24 ENCOUNTER — Encounter (HOSPITAL_COMMUNITY): Payer: Self-pay

## 2022-04-24 ENCOUNTER — Telehealth (HOSPITAL_COMMUNITY): Payer: Self-pay | Admitting: *Deleted

## 2022-04-24 ENCOUNTER — Inpatient Hospital Stay: Payer: Medicare HMO | Admitting: Family

## 2022-04-24 ENCOUNTER — Ambulatory Visit (HOSPITAL_BASED_OUTPATIENT_CLINIC_OR_DEPARTMENT_OTHER)
Admission: RE | Admit: 2022-04-24 | Discharge: 2022-04-24 | Disposition: A | Discharge status: Home / Self Care | Payer: Medicare HMO | Source: Ambulatory Visit | Attending: Internal Medicine | Admitting: Internal Medicine

## 2022-04-24 VITALS — BP 102/80 | HR 75 | Wt 198.6 lb

## 2022-04-24 DIAGNOSIS — Z7984 Long term (current) use of oral hypoglycemic drugs: Secondary | ICD-10-CM | POA: Diagnosis not present

## 2022-04-24 DIAGNOSIS — I082 Rheumatic disorders of both aortic and tricuspid valves: Secondary | ICD-10-CM | POA: Insufficient documentation

## 2022-04-24 DIAGNOSIS — I5022 Chronic systolic (congestive) heart failure: Secondary | ICD-10-CM

## 2022-04-24 DIAGNOSIS — Z7901 Long term (current) use of anticoagulants: Secondary | ICD-10-CM | POA: Diagnosis not present

## 2022-04-24 DIAGNOSIS — Z8249 Family history of ischemic heart disease and other diseases of the circulatory system: Secondary | ICD-10-CM | POA: Insufficient documentation

## 2022-04-24 DIAGNOSIS — N1831 Chronic kidney disease, stage 3a: Secondary | ICD-10-CM | POA: Insufficient documentation

## 2022-04-24 DIAGNOSIS — Z7951 Long term (current) use of inhaled steroids: Secondary | ICD-10-CM | POA: Diagnosis not present

## 2022-04-24 DIAGNOSIS — Z951 Presence of aortocoronary bypass graft: Secondary | ICD-10-CM | POA: Diagnosis not present

## 2022-04-24 DIAGNOSIS — Z452 Encounter for adjustment and management of vascular access device: Secondary | ICD-10-CM | POA: Diagnosis not present

## 2022-04-24 DIAGNOSIS — G4733 Obstructive sleep apnea (adult) (pediatric): Secondary | ICD-10-CM | POA: Diagnosis present

## 2022-04-24 DIAGNOSIS — I428 Other cardiomyopathies: Secondary | ICD-10-CM | POA: Insufficient documentation

## 2022-04-24 DIAGNOSIS — I5082 Biventricular heart failure: Secondary | ICD-10-CM | POA: Diagnosis present

## 2022-04-24 DIAGNOSIS — I272 Pulmonary hypertension, unspecified: Secondary | ICD-10-CM | POA: Diagnosis present

## 2022-04-24 DIAGNOSIS — E785 Hyperlipidemia, unspecified: Secondary | ICD-10-CM | POA: Diagnosis present

## 2022-04-24 DIAGNOSIS — I5023 Acute on chronic systolic (congestive) heart failure: Secondary | ICD-10-CM | POA: Diagnosis present

## 2022-04-24 DIAGNOSIS — I472 Ventricular tachycardia, unspecified: Secondary | ICD-10-CM | POA: Diagnosis present

## 2022-04-24 DIAGNOSIS — Z8711 Personal history of peptic ulcer disease: Secondary | ICD-10-CM | POA: Diagnosis not present

## 2022-04-24 DIAGNOSIS — Z87891 Personal history of nicotine dependence: Secondary | ICD-10-CM | POA: Diagnosis not present

## 2022-04-24 DIAGNOSIS — I959 Hypotension, unspecified: Secondary | ICD-10-CM | POA: Diagnosis not present

## 2022-04-24 DIAGNOSIS — I071 Rheumatic tricuspid insufficiency: Secondary | ICD-10-CM

## 2022-04-24 DIAGNOSIS — Z95810 Presence of automatic (implantable) cardiac defibrillator: Secondary | ICD-10-CM | POA: Insufficient documentation

## 2022-04-24 DIAGNOSIS — Z79899 Other long term (current) drug therapy: Secondary | ICD-10-CM | POA: Insufficient documentation

## 2022-04-24 DIAGNOSIS — I493 Ventricular premature depolarization: Secondary | ICD-10-CM | POA: Diagnosis present

## 2022-04-24 DIAGNOSIS — E1122 Type 2 diabetes mellitus with diabetic chronic kidney disease: Secondary | ICD-10-CM | POA: Insufficient documentation

## 2022-04-24 DIAGNOSIS — I480 Paroxysmal atrial fibrillation: Secondary | ICD-10-CM

## 2022-04-24 DIAGNOSIS — Z8701 Personal history of pneumonia (recurrent): Secondary | ICD-10-CM | POA: Diagnosis not present

## 2022-04-24 DIAGNOSIS — I2510 Atherosclerotic heart disease of native coronary artery without angina pectoris: Secondary | ICD-10-CM | POA: Diagnosis present

## 2022-04-24 DIAGNOSIS — K219 Gastro-esophageal reflux disease without esophagitis: Secondary | ICD-10-CM | POA: Diagnosis present

## 2022-04-24 LAB — CBC
HCT: 45.8 % (ref 39.0–52.0)
Hemoglobin: 14.5 g/dL (ref 13.0–17.0)
MCH: 25.2 pg — ABNORMAL LOW (ref 26.0–34.0)
MCHC: 31.7 g/dL (ref 30.0–36.0)
MCV: 79.5 fL — ABNORMAL LOW (ref 80.0–100.0)
Platelets: 179 10*3/uL (ref 150–400)
RBC: 5.76 MIL/uL (ref 4.22–5.81)
RDW: 16.2 % — ABNORMAL HIGH (ref 11.5–15.5)
WBC: 5 10*3/uL (ref 4.0–10.5)
nRBC: 0 % (ref 0.0–0.2)

## 2022-04-24 LAB — COMPREHENSIVE METABOLIC PANEL
ALT: 22 U/L (ref 0–44)
AST: 23 U/L (ref 15–41)
Albumin: 4 g/dL (ref 3.5–5.0)
Alkaline Phosphatase: 72 U/L (ref 38–126)
Anion gap: 12 (ref 5–15)
BUN: 32 mg/dL — ABNORMAL HIGH (ref 6–20)
CO2: 26 mmol/L (ref 22–32)
Calcium: 9.6 mg/dL (ref 8.9–10.3)
Chloride: 97 mmol/L — ABNORMAL LOW (ref 98–111)
Creatinine, Ser: 1.76 mg/dL — ABNORMAL HIGH (ref 0.61–1.24)
GFR, Estimated: 44 mL/min — ABNORMAL LOW (ref >60)
Glucose, Bld: 117 mg/dL — ABNORMAL HIGH (ref 70–99)
Potassium: 3.9 mmol/L (ref 3.5–5.1)
Sodium: 135 mmol/L (ref 135–145)
Total Bilirubin: 1.2 mg/dL (ref 0.3–1.2)
Total Protein: 7.2 g/dL (ref 6.5–8.1)

## 2022-04-24 NOTE — Progress Notes (Addendum)
HEART & VASCULAR TRANSITION OF CARE CONSULT NOTE     Referring Physician: Dr. Margaretann Loveless  Primary Care: Caren Macadam, MD (Inactive)  Primary Cardiologist:James Hochrein, MD Advanced Heart Failure ([facility], Dr. Posey Pronto) EP: Dr. Curt [facility]: Dr. Claiborne Billings   HPI: Referred to clinic by Dr. Margaretann Loveless for heart failure consultation.   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   Followed by Dr. [name]. Also followed closely at [facility] and see's Dr. Posey Pronto. Per last clinic note, 3/23, he had cardiopulmonary stress testing that was reassuring. Respiratory change ratio 1.37 with relative VO2 55% of predicted. Peak VO2 near 16. Chronically NYHA Class II. Has been on good GDMT. Not felt to need advanced therapies at time of last assessment by Dr. Posey Pronto.   Most recent Echo at [facility] 3/23, EF < 15%, RV moderately reduced, mild MR, moderate TR. It's not clear if he has ever had a cMRI.   Recently admitted to [facility] 6/23 w/ a/c CHF and volume overload. Hospitalization was c/b hypotension, requiring discontinuation of several HF medications. Lactate level was normal and renal function remained stable, w/ SCr maintaining at baseline. C/w warm/wet physiology. Echo showed EF < 20%, RV moderately reduced, mild MR and progression of TR to severe, GIIIDD (restrictive) and severe BAE. He was diuresed w/ IV Lasix and transitioned back to PO torsemide. Was continued on Jardiance and Coreg. Entresto, Sildenafil and Spironolactone discontinued given soft BP. D/c wt 194 lb. Referred to [facility] clinic.   Presents today for post hospital f/u. Here w/ his girlfriend. Feels poorly w/ increased fatigue and exertional dyspnea, progression to NYHA Class III. Wt is up 4 lb at 198 lb. ReDs clip elevated at  51% (out of proportion to exam). Only mild fluid overload on exam. JVD ~8 cm. Lungs CTAB. Complains of abdominal discomfort, early satiety, n/v. Reports full compliance w/ meds. BP 102/80. Denies dizziness. No syncope/ near syncope.    Cardiac Testing   CPX ([facility]) 01/2022 Conclusions:    1) The data suggests a maximal exercise test, so maximal cardiorespiratory    capacity could be determined.     2) Based on the peak oxygen consumption (VO2) achieved, functional capacity    would be consistent with a moderate functional impairment, with peak VO2    15.9 ml/kg/min (55% of predicted normals). Normative peak predicted VO2    values are corrected for age, gender, and weight.     3) There were maximal signs of a circulatory limitation to exercise as    evidenced by blunted stroke volume and blood pressure as well as    ventilation-perfusion mismatch with exercise.    - The O2 pulse (an indicator of stroke volume) reached a plateau at HR of    100 bpm; peak O2 pulse was 11.54 mL/beat (67% predicted)    - Elevated VeVCO2 slope 34.2 (125% predicted) and decreased Oxygen Uptake    Efficiency Slope 1381 mL/min (50% predicted)    - ECG notable sinus rhythm at rest with PVC's. Exercise ECG showed frequent    ventricular ectopy (PVCs, couplets, triplets, 4-5 beats NSVT).     4) Aerobic reserve was low-normal suggesting physical deconditioning may be    a potential limitation to exercise capacity.  5) Ventilatory reserve limits were approached at peak exercise. Normal    pulmonary physiology at rest. The MVV is normal. Exercise oscillatory    ventilation criteria was not met.     6) No prior CPET for comparison.    2D Echo 6/23 [facility])  Left ventricular ejection fraction, by estimation, is <20%. The left ventricle has severely decreased function. The left ventricle demonstrates global hypokinesis. The left ventricular internal cavity size was severely dilated. Left ventricular  diastolic parameters are consistent with Grade III diastolic dysfunction (restrictive). Elevated left atrial pressure. 1. Right ventricular systolic function is moderately reduced. The right ventricular size is moderately enlarged. There is moderately elevated pulmonary artery systolic pressure. The estimated right ventricular systolic pressure is 70.1 mmHg. 2. 3. Left atrial size was severely dilated. 4. Right atrial size was severely dilated. The mitral valve is normal in structure. Trivial mitral valve regurgitation. No evidence of mitral stenosis. 5. 6. The tricuspid valve is abnormal. Tricuspid valve regurgitation is severe. The aortic valve is tricuspid. Aortic valve regurgitation is mild. No aortic stenosis is present. 7. The inferior vena cava is dilated in size with >50% respiratory variability, suggesting right atrial pressure of 8 mmHg.   Review of Systems: [y] = yes, [ ]  = no   General: Weight gain [ Y]; Weight loss [ ] ; Anorexia [ Y]; Fatigue [ Y]; Fever [ ] ; Chills [ ] ; Weakness [ ]   Cardiac: Chest pain/pressure [ ] ; Resting SOB [ ] ; Exertional SOB [ Y]; Orthopnea [ ] ; Pedal Edema [ ] ; Palpitations [ ] ; Syncope [ ] ; Presyncope [ ] ; Paroxysmal nocturnal dyspnea[ ]   Pulmonary: Cough [ ] ; Wheezing[ ] ; Hemoptysis[ ] ; Sputum [ ] ; Snoring [ ]   GI: Vomiting[ ] ; Dysphagia[ ] ; Melena[ ] ; Hematochezia [ ] ; Heartburn[ ] ; Abdominal pain [Y ]; Constipation [ ] ; Diarrhea [ ] ; BRBPR [ ]   GU: Hematuria[ ] ; Dysuria [ ] ; Nocturia[ ]   Vascular: Pain in legs with walking [ ] ; Pain in feet with lying flat [ ] ; Non-healing sores [ ] ; Stroke [ ] ; TIA [ ] ; Slurred speech [ ] ;  Neuro: Headaches[ ] ; Vertigo[ ] ; Seizures[ ] ; Paresthesias[ ] ;Blurred vision [ ] ; Diplopia [ ] ; Vision changes [ ]   Ortho/Skin: Arthritis [ ] ; Joint pain [ ] ; Muscle pain [ ] ; Joint swelling [ ] ; Back Pain [ ] ; Rash [ ]   Psych: Depression[ ] ; Anxiety[ ]   Heme: Bleeding problems [ ] ; Clotting disorders [ ] ; Anemia [ ]    Endocrine: Diabetes [ ] ; Thyroid dysfunction[ ]    Past Medical History:  Diagnosis Date   AICD (automatic cardioverter/defibrillator) present    Anxiety 08/20/2016   Barrett's esophagus 2002   Cardiomyopathy    Nonischemic. EF has been about 45%. S/P CABG.;  b.  Echo 4/14: EF 25%, global HK with inf and mid apical AK, restrictive physiology with E/e' > 15 (elevated LV filling pressure), trivial AI/MR, mild to mod LAE, mild RVE, mild reduced RVSF, mild RAE, mod TR, PASP 74 (severe pulmonary HTN)    CHF (congestive heart failure) (HCC)    CHF exacerbation (HCC) 07/31/2016   Colon polyps 2002   Coronary artery disease    Diabetes mellitus without complication (HCC)    Type II   Dyslipidemia    hx (03/20/2017)   GERD (gastroesophageal reflux disease)    History of bleeding peptic ulcer    History of echocardiogram    Echo 10/16:  EF 25-30%, poss non-compaction, diff HK with inf-lat and apical HK, restrictive physio, mild  AI, severe LAE, mild RVE with mild reduced RVSF, PASP 42 mmHg   Hyperplastic rectal polyp    Moderate episode of recurrent major depressive disorder ([place]) 08/20/2016   09/14/20 - not current   OSA on CPAP    Mild   Pneumonia 09/2018   Tobacco abuse    Remote    Current Outpatient Medications  Medication Sig Dispense Refill   ACCU-CHEK GUIDE test strip Use as directed-patient needs an appt 100 each 1   Accu-Chek Softclix Lancets lancets Use as directed 100 each 1   albuterol (VENTOLIN HFA) 108 (90 Base) MCG/ACT inhaler Inhale 2 puffs into the lungs every 6 (six) hours as needed for wheezing or shortness of breath. 8 g 0   blood glucose meter kit and supplies KIT Dispense based on patient and insurance preference. Use up to four times daily as directed. 1 each 0   Budeson-Glycopyrrol-Formoterol (BREZTRI AEROSPHERE) 160-9-4.8 MCG/ACT AERO Inhale 2 puffs into the lungs 2 (two) times daily. 32.1 g 3   carvedilol (COREG) 6.25 MG tablet Take 1 tablet (6.25 mg total) by  mouth 2 (two) times daily. 180 tablet 3   ELIQUIS 5 MG TABS tablet Take 1 tablet by mouth twice daily 180 tablet 3   JARDIANCE 10 MG TABS tablet Take 1 tablet by mouth once daily 90 tablet 0   omeprazole (PRILOSEC) 40 MG capsule Take 40 mg by mouth daily.     simvastatin (ZOCOR) 40 MG tablet TAKE 1 TABLET BY MOUTH AT BEDTIME 90 tablet 1   torsemide (DEMADEX) 20 MG tablet Take 2 tablets (40 mg total) by mouth daily. 180 tablet 3   Spacer/Aero-Holding Chambers (AEROCHAMBER PLUS) inhaler Use as instructed (Patient not taking: Reported on 04/24/2022) 1 each 0   No current facility-administered medications for this encounter.    No Known Allergies    Social History   Socioeconomic History   Marital status: Significant Other    Spouse name: Not on file   Number of children: 5   Years of education: Not on file   Highest education level: High school graduate  Occupational History   Occupation: Civil engineer, contracting    Employer: [employer]    Comment: Disability 4 years  Tobacco Use   Smoking status: Former    Packs/day: 0.25    Years: 7.00    Total pack years: 1.75    Types: Cigarettes   Smokeless tobacco: Never   Tobacco comments:    03/20/2017 "quit in 1997  Vaping Use   Vaping Use: Former   Substances: [place]: vaped for about a month  Substance and Sexual Activity   Alcohol use: Not Currently    Comment: socially   Drug use: Yes    Frequency: 1.0 times per week    Types: Marijuana   Sexual activity: Yes  Other Topics Concern   Not on file  Social History Narrative   Single   Social Determinants of Health   Financial Resource Strain: Low Risk  (01/29/2022)   Overall Financial Resource Strain (CARDIA)    Difficulty of Paying Living Expenses: Not very hard  Food Insecurity: No Food Insecurity (04/17/2022)   Hunger Vital Sign    Worried About Running Out of Food in the Last Year: Never true    [place] in the Last Year: Never true  Transportation Needs: No  Transportation Needs (04/17/2022)   PRAPARE - Transportation    Lack of Transportation (Medical): No    Lack  of Transportation (Non-Medical): No  Physical Activity: Inactive (11/20/2021)   Exercise Vital Sign    Days of Exercise per Week: 0 days    Minutes of Exercise per Session: 0 min  Stress: No Stress Concern Present (11/20/2021)   [place]    Feeling of Stress : Only a little  Social Connections: Moderately Isolated (11/14/2020)   Social Connection and Isolation Panel [NHANES]    Frequency of Communication with Friends and Family: More than three times a week    Frequency of Social Gatherings with Friends and Family: Never    Attends Religious Services: More than 4 times per year    Active Member of [employer] or Organizations: No    Attends Archivist Meetings: Never    Marital Status: Divorced  Human resources officer Violence: Not At Risk (11/14/2020)   Humiliation, Afraid, Rape, and Kick questionnaire    Fear of Current or Ex-Partner: No    Emotionally Abused: No    Physically Abused: No    Sexually Abused: No      Family History  Problem Relation Age of Onset   Hypertension Mother    Osteoarthritis Mother    Heart failure Father 31   High blood pressure Father    Bipolar disorder Daughter    Diabetes Daughter    Cancer Maternal Uncle        uncertain type   Cancer Maternal Grandmother        uncertain type   Diabetes Niece    Lung cancer Other    Asthma Neg Hx    Breast cancer Neg Hx     Vitals:   04/24/22 1512  BP: 102/80  Pulse: 75  SpO2: 99%  Weight: 90.1 kg (198 lb 9.6 oz)    PHYSICAL EXAM: General:  fatigued appearing. No respiratory difficulty HEENT: normal Neck: supple. JVD 8 cm. Carotids 2+ bilat; no bruits. No lymphadenopathy or thryomegaly appreciated. Cor: PMI nondisplaced. Regular rate & rhythm. + TR murmur  Lungs: clear Abdomen: soft, nontender, nondistended. No  hepatosplenomegaly. No bruits or masses. Good bowel sounds. Extremities: no cyanosis, clubbing, rash, edema. Warm  Neuro: alert & oriented x 3, cranial nerves grossly intact. moves all 4 extremities w/o difficulty. Affect pleasant.  ECG: NSR w/ RAD, 73 bpm    ASSESSMENT & PLAN:  Chronic Biventricular Heart Failure  - NICM, ? Noncompaction. Has ICD  - followed closely by the [facility] (Dr. Posey Pronto) - Echo 3/23 ([facility]) EF < 15%, RV moderately reduced  - CXP 3/23 ([facility]) Respiratory change ratio 1.37 with relative VO2 55% of predicted. Peak VO2 near 16. At the time, not felt to need advanced therapies.  - Admit to [facility] 6/23 for a/c CHF w/ volume overload. Hospitalization c/b hypotension and inability to tolerate GDMT. Lactate 1.5  - Feels poorly w/ increased dyspnea, N/V, NYHA Class III symptoms. Mild fluidly overloaded on exam. Concern for low output HF. Recommend RHC to assess hemodynamics/ output. Pt agreeable to this. Will arrange for tomorrow morning w/ Dr. Haroldine Laws. If low output, will plan direct admit post cath for inotropic support and additional w/u for advanced therapies. May be nearing need for transplant evaluation. Has been tobacco free x 20 years. DM under good control.  - Continue Jardiance 10 mg daily  - Off Entresto, Sildenafil and Spironolactone w/ recent hypotension  - on Coreg 6.25 mg bid. May need to d/c pending output on RHC  - CMP today  2. PAF - NSR on EKG today  - on Eliquis   3. H/o LM Coronary Dissection  - iatrogenic during diagnostic LHC in 2007  - s/p CABG   4. Pulmonary HTN - recent echo RVSP 53 mmHg  - suspect combination WHO Groups 2&3 - + RV Failure - Sildenafil recently discontinued due to hypotension - plan RHC tomorrow to reassess PA pressures  - continue diuretics and CPAP for OSA   5. 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 OSA - compliant w/ CPAP  - followed by Dr. Claiborne Billings   7. Type 2DM - last Hgb  A1c 7.0 - on Jardiance - on statin, followed by PCP   8. CKD, Stage IIIa - b/l SCr ~1.6 - on Jardiance - CMP today    NYHA III GDMT  Diuretic- Torsemide 40 mg daily  BB- Coreg 6.25 mg bid  Ace/ARB/ARNI No (Entresto recently stopped due to hypotension)  MRA No Spironolactone recently stopped due to hypotension SGLT2i Jardiance 10 mg daily    Referred to HFSW (PCP, Medications, Transportation, ETOH Abuse, Drug Abuse, Insurance, Museum/gallery curator ): No Refer to Pharmacy: No Refer to Home Health:  No Refer to Advanced Heart Failure Clinic: Yes (shared care w/ [facility], Assign to Dr. Haroldine Laws)  Refer to General Cardiology: Yes (shared care, followed by Dr. [name] and Dr. Ileana Ladd)   Follow up in [facility] 1wk post [place].   Brittainy Simmons. PA-C   Patient seen and examined with the above-signed Advanced Practice Provider and/or Housestaff. I personally reviewed laboratory data, imaging studies and relevant notes. I independently examined the patient and formulated the important aspects of the plan. I have edited the note to reflect any of my changes or salient points. I have personally discussed the plan with the patient and/or family.   61 y/o male with systolic HF due to NICM (had CABG due to LM dissection during cath). EF < 20% Has seen Dr. Posey Pronto 6 months ago for transplant eval but felt to early.   Recently admitted for volume overload and diuresed.   Presents to [facility] clinic for post-hospital f/u with his wife.  Weight down but with class NYHA IIIB-IV symptoms  General:  Fatigued appearing. No resp difficulty HEENT: normal Neck: supple. JVP 7-8 Carotids 2+ bilat; no bruits. No lymphadenopathy or thryomegaly appreciated. Cor: PMI laterally displaced. Regular rate & rhythm. No rubs, gallops or murmurs. Lungs: clear Abdomen: soft, nontender, nondistended. No hepatosplenomegaly. No bruits or masses. Good bowel sounds. Extremities: no cyanosis, clubbing, rash, edema Neuro: alert &  orientedx3, cranial nerves grossly intact. moves all 4 extremities w/o difficulty. Affect pleasant  Volume status doesn't seem to bad. But very symptomatic. Concern for low output HF. Plan RHC with possible admission in am.   Glori Bickers, MD  9:50 PM

## 2022-04-24 NOTE — Patient Instructions (Addendum)
EKG done today.  Labs done today. We will contact you only if your labs are abnormal.  No medication changes were made. Please continue all current medications as prescribed.  Your physician recommends that you schedule a follow-up appointment in: 2 weeks with our NP/PA Clinic here in our office.   If you have any questions or concerns before your next appointment please send [place] a message through [place] or call our office at [phone].    TO LEAVE A MESSAGE FOR THE NURSE SELECT OPTION 2, PLEASE LEAVE A MESSAGE INCLUDING: YOUR NAME DATE OF BIRTH CALL BACK NUMBER REASON FOR CALL**this is important as we prioritize the call backs  YOU WILL RECEIVE A CALL BACK THE SAME DAY AS LONG AS YOU CALL BEFORE 4:00 PM   Do the following things EVERYDAY: Weigh yourself in the morning before breakfast. Write it down and keep it in a log. Take your medicines as prescribed Eat low salt foods--Limit salt (sodium) to 2000 mg per day.  Stay as active as you can everyday Limit all fluids for the day to less than 2 liters   At the [facility], you and your health needs are our priority. As part of our continuing mission to provide you with exceptional heart care, we have created designated Provider Care Teams. These Care Teams include your primary Cardiologist (physician) and Advanced Practice Providers (APPs- Physician Assistants and Nurse Practitioners) who all work together to provide you with the care you need, when you need it.   You may see any of the following providers on your designated Care Team at your next follow up: Dr Glori Bickers Dr Haynes Kerns, NP Lyda Jester, [place] Audry Riles, PharmD   Please be sure to bring in all your medications bottles to every appointment.   You are scheduled for a Cardiac Catheterization on Thursday, June 22 with Dr. Glori Bickers.  1. Please arrive at the Main Entrance A at [facility]: [place], [place] [address] at 8:00 AM (This time is two hours before your procedure to ensure your preparation). Free valet parking service is available.   Special note: Every effort is made to have your procedure done on time. Please understand that emergencies sometimes delay scheduled procedures.  2. Diet: Do not eat solid foods after midnight.  You may have clear liquids until 5 AM upon the day of the procedure.  3. Medication instructions in preparation for your procedure: DO NOT take your Eliquis and Torsemide the morning of your procedure.   On the morning of your procedure, take any morning medicines NOT listed above.  You may use sips of water.  4. Plan to go home the same day, you will only stay overnight if medically necessary. 5. You MUST have a responsible adult to drive you home. 6. An adult MUST be with you the first 24 hours after you arrive home. 7. Bring a current list of your medications, and the last time and date medication taken. 8. Bring ID and current insurance cards. 9.Please wear clothes that are easy to get on and off and wear slip-on shoes.  Thank you for allowing [place] to care for you!   -- [facility]

## 2022-04-24 NOTE — Telephone Encounter (Signed)
Called to confirm Heart & Vascular Transitions of Care appointment at 3 pm on 04/24/22. Patient reminded to bring all medications and pill box organizer with them. Confirmed patient has transportation. Gave directions, instructed to utilize [place] parking.  Confirmed appointment prior to ending call.    Earnestine Leys, BSN, Clinical cytogeneticist Only

## 2022-04-25 ENCOUNTER — Encounter (HOSPITAL_COMMUNITY)
Admission: RE | Disposition: A | Discharge status: Home / Self Care | Payer: Self-pay | Source: Home / Self Care | Attending: Internal Medicine

## 2022-04-25 ENCOUNTER — Other Ambulatory Visit: Payer: Self-pay

## 2022-04-25 ENCOUNTER — Inpatient Hospital Stay (HOSPITAL_COMMUNITY)
Admission: RE | Admit: 2022-04-25 | Discharge: 2022-04-29 | DRG: 287 | Disposition: A | Discharge status: Home Health | Payer: Medicare HMO | Attending: Internal Medicine | Admitting: Internal Medicine

## 2022-04-25 ENCOUNTER — Inpatient Hospital Stay: Payer: Self-pay

## 2022-04-25 ENCOUNTER — Encounter (HOSPITAL_COMMUNITY): Payer: Self-pay | Admitting: Internal Medicine

## 2022-04-25 DIAGNOSIS — Z7901 Long term (current) use of anticoagulants: Secondary | ICD-10-CM

## 2022-04-25 DIAGNOSIS — K219 Gastro-esophageal reflux disease without esophagitis: Secondary | ICD-10-CM | POA: Diagnosis present

## 2022-04-25 DIAGNOSIS — Z8711 Personal history of peptic ulcer disease: Secondary | ICD-10-CM | POA: Diagnosis not present

## 2022-04-25 DIAGNOSIS — I5082 Biventricular heart failure: Secondary | ICD-10-CM | POA: Diagnosis present

## 2022-04-25 DIAGNOSIS — I480 Paroxysmal atrial fibrillation: Secondary | ICD-10-CM | POA: Diagnosis present

## 2022-04-25 DIAGNOSIS — I428 Other cardiomyopathies: Secondary | ICD-10-CM | POA: Diagnosis present

## 2022-04-25 DIAGNOSIS — E785 Hyperlipidemia, unspecified: Secondary | ICD-10-CM | POA: Diagnosis present

## 2022-04-25 DIAGNOSIS — I2510 Atherosclerotic heart disease of native coronary artery without angina pectoris: Secondary | ICD-10-CM | POA: Diagnosis present

## 2022-04-25 DIAGNOSIS — Z87891 Personal history of nicotine dependence: Secondary | ICD-10-CM

## 2022-04-25 DIAGNOSIS — Z79899 Other long term (current) drug therapy: Secondary | ICD-10-CM | POA: Diagnosis not present

## 2022-04-25 DIAGNOSIS — N183 Chronic kidney disease, stage 3 unspecified: Secondary | ICD-10-CM

## 2022-04-25 DIAGNOSIS — Z951 Presence of aortocoronary bypass graft: Secondary | ICD-10-CM | POA: Diagnosis not present

## 2022-04-25 DIAGNOSIS — I493 Ventricular premature depolarization: Secondary | ICD-10-CM | POA: Diagnosis present

## 2022-04-25 DIAGNOSIS — I071 Rheumatic tricuspid insufficiency: Secondary | ICD-10-CM | POA: Diagnosis present

## 2022-04-25 DIAGNOSIS — Z7951 Long term (current) use of inhaled steroids: Secondary | ICD-10-CM | POA: Diagnosis not present

## 2022-04-25 DIAGNOSIS — I420 Dilated cardiomyopathy: Secondary | ICD-10-CM

## 2022-04-25 DIAGNOSIS — I472 Ventricular tachycardia, unspecified: Secondary | ICD-10-CM | POA: Diagnosis present

## 2022-04-25 DIAGNOSIS — I959 Hypotension, unspecified: Secondary | ICD-10-CM | POA: Diagnosis not present

## 2022-04-25 DIAGNOSIS — I272 Pulmonary hypertension, unspecified: Secondary | ICD-10-CM | POA: Diagnosis present

## 2022-04-25 DIAGNOSIS — Z8701 Personal history of pneumonia (recurrent): Secondary | ICD-10-CM

## 2022-04-25 DIAGNOSIS — Z452 Encounter for adjustment and management of vascular access device: Secondary | ICD-10-CM | POA: Diagnosis not present

## 2022-04-25 DIAGNOSIS — Z8249 Family history of ischemic heart disease and other diseases of the circulatory system: Secondary | ICD-10-CM

## 2022-04-25 DIAGNOSIS — I5023 Acute on chronic systolic (congestive) heart failure: Principal | ICD-10-CM | POA: Diagnosis present

## 2022-04-25 DIAGNOSIS — N1831 Chronic kidney disease, stage 3a: Secondary | ICD-10-CM | POA: Diagnosis present

## 2022-04-25 DIAGNOSIS — Z4502 Encounter for adjustment and management of automatic implantable cardiac defibrillator: Secondary | ICD-10-CM

## 2022-04-25 DIAGNOSIS — G4733 Obstructive sleep apnea (adult) (pediatric): Secondary | ICD-10-CM | POA: Diagnosis present

## 2022-04-25 DIAGNOSIS — Z7984 Long term (current) use of oral hypoglycemic drugs: Secondary | ICD-10-CM | POA: Diagnosis not present

## 2022-04-25 DIAGNOSIS — E1122 Type 2 diabetes mellitus with diabetic chronic kidney disease: Secondary | ICD-10-CM | POA: Diagnosis present

## 2022-04-25 DIAGNOSIS — Z95810 Presence of automatic (implantable) cardiac defibrillator: Secondary | ICD-10-CM | POA: Diagnosis not present

## 2022-04-25 HISTORY — PX: RIGHT HEART CATH: CATH118263

## 2022-04-25 LAB — POCT I-STAT EG7
Acid-Base Excess: 5 mmol/L — ABNORMAL HIGH (ref 0.0–2.0)
Acid-Base Excess: 6 mmol/L — ABNORMAL HIGH (ref 0.0–2.0)
Bicarbonate: 30.1 mmol/L — ABNORMAL HIGH (ref 20.0–28.0)
Bicarbonate: 31.9 mmol/L — ABNORMAL HIGH (ref 20.0–28.0)
Calcium, Ion: 1.24 mmol/L (ref 1.15–1.40)
Calcium, Ion: 1.24 mmol/L (ref 1.15–1.40)
HCT: 44 % (ref 39.0–52.0)
HCT: 45 % (ref 39.0–52.0)
Hemoglobin: 15 g/dL (ref 13.0–17.0)
Hemoglobin: 15.3 g/dL (ref 13.0–17.0)
O2 Saturation: 53 %
O2 Saturation: 53 %
Potassium: 3.6 mmol/L (ref 3.5–5.1)
Potassium: 3.8 mmol/L (ref 3.5–5.1)
Sodium: 136 mmol/L (ref 135–145)
Sodium: 137 mmol/L (ref 135–145)
TCO2: 31 mmol/L (ref 22–32)
TCO2: 33 mmol/L — ABNORMAL HIGH (ref 22–32)
pCO2, Ven: 45 mmHg (ref 44–60)
pCO2, Ven: 47.8 mmHg (ref 44–60)
pH, Ven: 7.433 — ABNORMAL HIGH (ref 7.25–7.43)
pH, Ven: 7.433 — ABNORMAL HIGH (ref 7.25–7.43)
pO2, Ven: 28 mmHg — CL (ref 32–45)
pO2, Ven: 28 mmHg — CL (ref 32–45)

## 2022-04-25 LAB — MRSA NEXT GEN BY PCR, NASAL: MRSA by PCR Next Gen: NOT DETECTED

## 2022-04-25 LAB — COOXEMETRY PANEL
Carboxyhemoglobin: 1.6 % — ABNORMAL HIGH (ref 0.5–1.5)
Methemoglobin: <0.7 % (ref 0.0–1.5)
O2 Saturation: 59.2 %
Total hemoglobin: 14.1 g/dL (ref 12.0–16.0)

## 2022-04-25 LAB — GLUCOSE, CAPILLARY: Glucose-Capillary: 117 mg/dL — ABNORMAL HIGH (ref 70–99)

## 2022-04-25 SURGERY — RIGHT HEART CATH
Anesthesia: LOCAL

## 2022-04-25 MED ORDER — SODIUM CHLORIDE 0.9 % IV SOLN: 250.0000 mL | INTRAVENOUS | Status: DC | PRN

## 2022-04-25 MED ORDER — LIDOCAINE HCL (PF) 1 % IJ SOLN
INTRAMUSCULAR | Status: AC
  Filled 2022-04-25: qty 30

## 2022-04-25 MED ORDER — SODIUM CHLORIDE 0.9% FLUSH
3.0000 mL | Freq: Two times a day (BID) | INTRAVENOUS | Status: DC
  Administered 2022-04-26 – 2022-04-29 (×5): 3 mL via INTRAVENOUS

## 2022-04-25 MED ORDER — EMPAGLIFLOZIN 10 MG PO TABS
10.0000 mg | ORAL_TABLET | Freq: Every day | ORAL | Status: DC
  Administered 2022-04-25 – 2022-04-29 (×5): 10 mg via ORAL
  Filled 2022-04-25 (×5): qty 1

## 2022-04-25 MED ORDER — SODIUM CHLORIDE 0.9% FLUSH
3.0000 mL | Freq: Two times a day (BID) | INTRAVENOUS | Status: DC
  Administered 2022-04-25 – 2022-04-29 (×6): 3 mL via INTRAVENOUS

## 2022-04-25 MED ORDER — HEPARIN (PORCINE) IN NACL 1000-0.9 UT/500ML-% IV SOLN
INTRAVENOUS | Status: DC | PRN
  Administered 2022-04-25: 500 mL

## 2022-04-25 MED ORDER — ACETAMINOPHEN 325 MG PO TABS: 650.0000 mg | ORAL_TABLET | ORAL | Status: DC | PRN

## 2022-04-25 MED ORDER — HYDRALAZINE HCL 20 MG/ML IJ SOLN: 10.0000 mg | INTRAMUSCULAR | Status: AC | PRN

## 2022-04-25 MED ORDER — ONDANSETRON HCL 4 MG/2ML IJ SOLN
4.0000 mg | Freq: Four times a day (QID) | INTRAMUSCULAR | Status: DC | PRN
  Administered 2022-04-25: 4 mg via INTRAVENOUS
  Filled 2022-04-25: qty 2

## 2022-04-25 MED ORDER — SODIUM CHLORIDE 0.9% FLUSH: 3.0000 mL | INTRAVENOUS | Status: DC | PRN

## 2022-04-25 MED ORDER — SODIUM CHLORIDE 0.9% FLUSH: 10.0000 mL | INTRAVENOUS | Status: DC | PRN

## 2022-04-25 MED ORDER — ONDANSETRON HCL 4 MG/2ML IJ SOLN
4.0000 mg | Freq: Four times a day (QID) | INTRAMUSCULAR | Status: DC | PRN
  Administered 2022-04-26 – 2022-04-29 (×2): 4 mg via INTRAVENOUS
  Filled 2022-04-25: qty 2

## 2022-04-25 MED ORDER — ONDANSETRON HCL 4 MG/2ML IJ SOLN
INTRAMUSCULAR | Status: AC
Start: 2022-04-25 — End: ?
  Filled 2022-04-25: qty 2

## 2022-04-25 MED ORDER — SODIUM CHLORIDE 0.9% FLUSH
10.0000 mL | Freq: Two times a day (BID) | INTRAVENOUS | Status: DC
  Administered 2022-04-25 – 2022-04-27 (×4): 10 mL
  Administered 2022-04-27: 20 mL
  Administered 2022-04-28: 10 mL
  Administered 2022-04-28: 20 mL
  Administered 2022-04-29: 10 mL

## 2022-04-25 MED ORDER — SIMVASTATIN 20 MG PO TABS
40.0000 mg | ORAL_TABLET | Freq: Every day | ORAL | Status: DC
  Administered 2022-04-25 – 2022-04-28 (×4): 40 mg via ORAL
  Filled 2022-04-25 (×4): qty 2

## 2022-04-25 MED ORDER — SODIUM CHLORIDE 0.9% FLUSH: 3.0000 mL | Freq: Two times a day (BID) | INTRAVENOUS | Status: DC

## 2022-04-25 MED ORDER — FUROSEMIDE 10 MG/ML IJ SOLN
40.0000 mg | Freq: Two times a day (BID) | INTRAMUSCULAR | Status: DC
Start: 2022-04-25 — End: 2022-04-27
  Administered 2022-04-25 – 2022-04-27 (×4): 40 mg via INTRAVENOUS
  Filled 2022-04-25 (×4): qty 4

## 2022-04-25 MED ORDER — ASPIRIN 81 MG PO CHEW
81.0000 mg | CHEWABLE_TABLET | ORAL | Status: AC
  Administered 2022-04-25: 81 mg via ORAL
  Filled 2022-04-25: qty 1

## 2022-04-25 MED ORDER — MILRINONE LACTATE IN DEXTROSE 20-5 MG/100ML-% IV SOLN
0.2500 ug/kg/min | INTRAVENOUS | Status: DC
  Administered 2022-04-25 – 2022-04-29 (×7): 0.25 ug/kg/min via INTRAVENOUS
  Filled 2022-04-25 (×9): qty 100

## 2022-04-25 MED ORDER — APIXABAN 5 MG PO TABS
5.0000 mg | ORAL_TABLET | Freq: Two times a day (BID) | ORAL | Status: DC
  Administered 2022-04-25 – 2022-04-29 (×9): 5 mg via ORAL
  Filled 2022-04-25 (×9): qty 1

## 2022-04-25 MED ORDER — HEPARIN (PORCINE) IN NACL 1000-0.9 UT/500ML-% IV SOLN
INTRAVENOUS | Status: AC
Start: 2022-04-25 — End: ?
  Filled 2022-04-25: qty 500

## 2022-04-25 MED ORDER — SODIUM CHLORIDE 0.9 % IV SOLN: INTRAVENOUS | Status: DC

## 2022-04-25 MED ORDER — ASPIRIN 81 MG PO CHEW: 81.0000 mg | CHEWABLE_TABLET | ORAL | Status: DC

## 2022-04-25 MED ORDER — LIDOCAINE HCL (PF) 1 % IJ SOLN
INTRAMUSCULAR | Status: DC | PRN
  Administered 2022-04-25: 2 mL via INTRADERMAL

## 2022-04-25 MED ORDER — CHLORHEXIDINE GLUCONATE CLOTH 2 % EX PADS
6.0000 | MEDICATED_PAD | Freq: Every day | CUTANEOUS | Status: DC
  Administered 2022-04-25 – 2022-04-29 (×5): 6 via TOPICAL

## 2022-04-25 MED ORDER — LABETALOL HCL 5 MG/ML IV SOLN: 10.0000 mg | INTRAVENOUS | Status: AC | PRN

## 2022-04-25 SURGICAL SUPPLY — 10 items
CATH BALLN WEDGE 5F 110CM (CATHETERS) ×1 IMPLANT
GUIDEWIRE .025 260CM (WIRE) ×1 IMPLANT
PACK CARDIAC CATHETERIZATION (CUSTOM PROCEDURE TRAY) ×2 IMPLANT
PROTECTION STATION PRESSURIZED (MISCELLANEOUS) ×2
SHEATH GLIDE SLENDER 4/5FR (SHEATH) ×1 IMPLANT
STATION PROTECTION PRESSURIZED (MISCELLANEOUS) IMPLANT
TRANSDUCER W/STOPCOCK (MISCELLANEOUS) ×2 IMPLANT
TUBING ART PRESS 72  MALE/FEM (TUBING) ×2
TUBING ART PRESS 72 MALE/FEM (TUBING) IMPLANT
WIRE EMERALD 3MM-J .025X260CM (WIRE) ×1 IMPLANT

## 2022-04-25 NOTE — Progress Notes (Signed)
Heart Failure Navigator Progress Note ? ?Assessed for Heart & Vascular TOC clinic readiness.  ?Patient does not meet criteria due to Advanced Heart Failure Team patient. .  ? ? ? ?Bruce Robertson, BSN, RN ?Heart Failure Nurse Navigator ?Secure Chat Only   ?

## 2022-04-25 NOTE — Discharge Summary (Incomplete)
Advanced Heart Failure Team  Discharge Summary   Patient ID: Bruce Robertson MRN: [id number], DOB/AGE: [birth year] 61 y.o. 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 PAF 3. H/o LM Coronary Dissection with subsequent CABG 4. Pulmonary HTN 5. Severe TR 6. OSA 7. Type 2DM 8. CKD, Stage IIIa 9. PVCs   Hospital Course:  Bruce Robertson is a 61 y.o. male with hx chronic systolic CHF/NICM s/p ICD, hx CABG after iatrogenic LM dissection during cath 2007, type II DM, AF, OSA on CPAP, PH, OSA on CPAP, CKD IIIa.    Followed by Dr. [name]. Also followed closely at [facility] and see's Dr. Posey Pronto. Per last clinic note, 01/2022, he had cardiopulmonary stress testing that was reassuring. Respiratory change ratio 1.37 with relative VO2 55% of predicted. Peak VO2 near 16. Chronically NYHA Class II. Has been on good GDMT. Not felt to need advanced therapies at time of last assessment by Dr. Posey Pronto.    Most recent Echo at [facility] 01/2022, EF < 15%, RV moderately reduced, mild Bruce, moderate TR. It's not clear if he has ever had a cMRI. Had CPX    Admitted to [facility] 04/15/22 with A/C HFrEF and  volume overload. Hospitalization was c/b hypotension. GDMT limited due to hypotension.  Echo showed EF < 20%, RV moderately reduced. Diuresed with IV lasix and transitioned to 40 mg torsemide twice a day. Also discharged jardiacne and carvedilol.   He was seen in [facility] clinic 04/24/22 for post hospital follow up. Symptomatic at that time. RHC was arranged.   Admitted from cath lab with low output heart failure and elevated left sided filling pressures.  Started on milrinone 0.25 mcg/kg/min and diuresed with IV lasix . Overall diuresed 8 pounds. Transitioned to torsemide 40 mg daily. Creatinine bumped so diuretics held and will restart torsemide 20 mg daily on 04/30/22. GDMT limited by hypotension. Not on ARNi or MRA. Tunneled PICC  placed for home milrinone.   High PVC burden. Mexiletine started but had ongoing high PVC burden. Switched to amiodarone 200 mg twice a day. Plan to check an ECHO in 4 weeks.   Dr Haroldine Laws discussed with Dr Posey Pronto at [facility]. They will arrange to see him. We will follow him as well. Plan repeat echo in 1 month to see if EF improves with PVC suppression.     TOC consulted for home milrinone. [place] Agency: [facility], [facility]. Weekly labs per protocol.   1.  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 Noncompaction. Has ICD  - followed closely by the [facility] (Dr. Posey Pronto) - Echo 3/23 ([facility]) EF < 15%, RV moderately reduced  - CXP 3/23 ([facility]) Respiratory change ratio 1.37 with relative VO2 55% of predicted. Peak VO2 near 16. At the time, not felt to need advanced therapies.  - Admitted to [facility] 04/14/22 for a/c CHF w/ volume overload. Hospitalization c/b hypotension and inability to tolerate GDMT.  - Admitted from cath lab with low output heart failure and elevated left sided filling pressures.  Started on milrinone 0.25 mcg/kg/min and diuresed with IV lasix . Diuresed with IV lasix and transitioned to torsemide 40 mg daily. Creatinine bumped so torsemide was cut back to 20 mg daily.  - CO-OX remained stable.  - Has been tobacco free x 20 years. DM under good control. D/W Dr. Posey Pronto at [facility]. Will follow-up at [facility] after  discharge to begin transplant workup.  - Blood type O+  - Continue Jardiance 10 mg daily and low dose spironolactone.  - GDMT limited by hypotension/low output. No ARNi/BB.  - Plan to check ECHO in 4 weeks.    2. PAF - Maintaining SR. - on Eliquis    3. H/o LM Coronary Dissection  - iatrogenic during diagnostic LHC in 2007  - s/p CABG    4. Pulmonary HTN - recent echo RVSP 53 mmHg  - suspect combination WHO Groups 2&3 - RHC this admit: RA 7, PA 54/28 (39), PCWP 29 (v 35), Fick CO/CI 3.4/1.6, PVR 2.9 WU, PAPi 3.7, PA sat 53% - Sildenafil  recently discontinued due to hypotension - continue diuretics and CPAP for OSA    5. Severe TR - likely functional in setting of dilated RA/RV   6. OSA - compliant w/ CPAP  - followed by Dr. Claiborne Billings    7. Type 2DM - last Hgb A1c 7.0 - on Jardiance - on statin, followed by PCP    8. CKD, Stage IIIa - b/l SCr ~1.6, creatinine 1.85 on the day d/c - Check BMET next week.  -  on Jardiance   9. PVCs - Noted on tele during [place] - Watch burden   Discharge Weight: 186.5 pounds.  Discharge Vitals: Blood pressure 102/73, pulse 80, temperature 97.8 F (36.6 C), temperature source Oral, resp. rate 19, height 6' (1.829 m), weight 84.6 kg, SpO2 97 %.  Labs: Lab Results  Component Value Date   WBC 5.0 04/24/2022   HGB 15.3 04/25/2022   HGB 15.0 04/25/2022   HCT 45.0 04/25/2022   HCT 44.0 04/25/2022   MCV 79.5 (L) 04/24/2022   PLT 179 04/24/2022    Recent Labs  Lab 04/24/22 1601 04/25/22 1007 04/29/22 0340  NA 135   < > 133*  K 3.9   < > 4.3  CL 97*   < > 99  CO2 26   < > 26  BUN 32*   < > 21*  CREATININE 1.76*   < > 1.85*  CALCIUM 9.6   < > 9.1  PROT 7.2  --   --   BILITOT 1.2  --   --   ALKPHOS 72  --   --   ALT 22  --   --   AST 23  --   --   GLUCOSE 117*   < > 144*   < > = values in this interval not displayed.   Lab Results  Component Value Date   CHOL 141 11/30/2021   HDL 48.90 11/30/2021   LDLCALC 76 11/30/2021   TRIG 82.0 11/30/2021   BNP (last 3 results) Recent Labs    04/15/22 0933  BNP 1,220.8*    ProBNP (last 3 results) Recent Labs    05/30/21 0848 11/30/21 1301  PROBNP 519.0* 943.0*     Diagnostic Studies/Procedures  RHC 04/25/22  RA = 7 RV = 48/16 PA = 54/28 (39) PCW = 29 (v = 35) Fick cardiac output/index = 3.4/1.6 PVR = 2.9 WU Ao sat = 95% PA sat = 53%, 53% PAPi = 3.7  Assessment: 1. Elevated filling pressures with low output 2. Frequent PVCs on tele  Discharge Medications   Allergies as of 04/29/2022   No Known Allergies       Medication List     STOP taking these medications    carvedilol 6.25 MG tablet Commonly known as: COREG   simvastatin 40 MG tablet  Commonly known as: ZOCOR       TAKE these medications    Accu-Chek Guide test strip Generic drug: glucose blood Use as directed-patient needs an appt   Accu-Chek Softclix Lancets lancets Use as directed   AeroChamber Plus inhaler Use as instructed   albuterol 108 (90 Base) MCG/ACT inhaler Commonly known as: VENTOLIN HFA Inhale 2 puffs into the lungs every 6 (six) hours as needed for wheezing or shortness of breath.   amiodarone 200 MG tablet Commonly known as: PACERONE Take 1 tablet (200 mg total) by mouth 2 (two) times daily.   atorvastatin 40 MG tablet Commonly known as: LIPITOR Take 1 tablet (40 mg total) by mouth daily. Start taking on: April 30, 2022   blood glucose meter kit and supplies Kit Dispense based on patient and insurance preference. Use up to four times daily as directed.   Breztri Aerosphere 160-9-4.8 MCG/ACT Aero Generic drug: Budeson-Glycopyrrol-Formoterol Inhale 2 puffs into the lungs 2 (two) times daily.   digoxin 0.125 MG tablet Commonly known as: LANOXIN Take 1 tablet (0.125 mg total) by mouth daily. Start taking on: April 30, 2022   Eliquis 5 MG Tabs tablet Generic drug: apixaban Take 1 tablet by mouth twice daily   Jardiance 10 MG Tabs tablet Generic drug: empagliflozin Take 1 tablet by mouth once daily   milrinone 20 MG/100 ML Soln infusion Commonly known as: PRIMACOR Inject 0.0212 mg/min into the vein continuous.   omeprazole 40 MG capsule Commonly known as: PRILOSEC Take 40 mg by mouth daily.   spironolactone 25 MG tablet Commonly known as: ALDACTONE Take 0.5 tablets (12.5 mg total) by mouth daily. Start taking on: April 30, 2022   torsemide 20 MG tablet Commonly known as: DEMADEX Take 1 tablet (20 mg total) by mouth daily. Start taking on: April 30, 2022 What changed: how much to  take               Durable Medical Equipment  (From admission, onward)           Start     Ordered   04/25/22 1620  Heart failure home health orders  (Heart failure home health orders / Face to face)  Once       Comments: Heart Failure Follow-up Care:  Verify follow-up appointments per Patient Discharge Instructions. Confirm transportation arranged. Reconcile home medications with discharge medication list. Remove discontinued medications from use. Assist patient/caregiver to manage medications using pill box. Reinforce low sodium food selection Assessments: Vital signs and oxygen saturation at each visit. Assess home environment for safety concerns, caregiver support and availability of low-sodium foods. Consult Education officer, museum, PT/OT, Dietitian, and CNA based on assessments. Perform comprehensive cardiopulmonary assessment. Notify MD for any change in condition or weight gain of 3 pounds in one day or 5 pounds in one week with symptoms. Daily Weights and Symptom Monitoring: Ensure patient has access to scales. Teach patient/caregiver to weigh daily before breakfast and after voiding using same scale and record.    Teach patient/caregiver to track weight and symptoms and when to notify Provider. Activity: Develop individualized activity plan with patient/caregiver.   Home Paraenteral Inotropic Therapy : Data Collection Form  Patients name: Braylee Bosher   Date: 04/25/22 NYHA IV  Information below may not be completed by the supplier nor anyone in a Financial relationship with the supplier.  1. Results of invasive hemodynamic monitoring  Cardiac Index Before Inotrope infusion:            1.6  Drug and dose:   Milrinone 0.25 mcg/kg/min  2. Cardiac medications immediately prior to inotrope infusion (List name, dose, and frequency)  Coreg 6.25 mg BID, Jardiance 10 mg daily  3. Dose this represent maximum tolerated doses of these medications?  Yes.   4. Breathing status Prior to inotrope infusion: Dyspnea at rest   5. Initial home prescription Drug and Dose:   milrinone 0.25 mcg/kg/mi for continuous infusion 24/hr day and 7 days/week  6. If intermittent infusion is prescribed, have there been repeated hospitalizations for heart failure which Parenteral inotrope were required? Not applicable.   7. Is patient capable of going to the physician for outpatient evaluation? Yes.    8. Is routine electrocardiographic monitoring required in the Home?  No.   The above statements and any additional explanations included separately are true and accurate and there is documentation present in the patients medical record to support these statements.   Completed by [facility], Lynder Parents, PA-C   In instances where this form was completed by an Advanced Practice Provider, please see EMR for physician Co-Signature.  [facility] to provide  Labs every other week to include BMET, Mg, and CBC with Diff. Additional as needed. Should be drawn via PERIPHERAL stick. NOT PICC line.   V7846 Milrinone 0.25 mcg/kg/min X 52 weeks A4221 Supplies for maintenance of drug infusion catheter A4222 Supplies for the external drug infusion per cassette or bag E0781 Ambulatory Infusion pump  Question Answer Comment  Heart Failure Follow-up Care Advanced Heart Failure (AHF) Clinic at [phone]   Obtain the following labs Basic Metabolic Panel   Lab frequency Other see comments   Fax lab results to AHF Clinic at [phone]   Diet Low Sodium Heart Healthy   Fluid restrictions: 1500 mL Fluid      04/25/22 1619            Disposition   The patient will be discharged in stable condition to home on home milrinone.  Discharge Instructions     (HEART FAILURE PATIENTS) Call MD:  Anytime you have any of the following symptoms: 1) 3 pound weight gain in 24 hours or 5 pounds in 1 week 2) shortness of breath, with or without a dry hacking cough 3) swelling in the  hands, feet or stomach 4) if you have to sleep on extra pillows at night in order to breathe.   Complete by: As directed    Diet - low sodium heart healthy   Complete by: As directed    Heart Failure patients record your daily weight using the same scale at the same time of day   Complete by: As directed    Increase activity slowly   Complete by: As directed        Follow-up Information     [place] Follow up.   Why: Home Health RN-agency will call to arrange appt. Contact information: [phone]        [facility] Follow up.   Why: will provide IV medications for home Contact information: [place] Follow up on 05/10/2022.   Specialty: Cardiology Why: 7/7 at 10:30. Please bring all medications. Contact information: [address] [phone] [place] [phone]                  Duration of Discharge Encounter: Greater than 35 minutes   Signed, Jaeden Messer NP-C  04/29/2022, 9:50 AM  Patient seen and examined with the above-signed Advanced Practice Provider and/or Housestaff. I personally reviewed laboratory data, imaging studies and relevant notes. I independently examined the patient and formulated the important aspects of the plan. I have edited the note to reflect any of my changes or salient points. I have personally discussed the plan with the patient and/or family.  He is ready for d/c. Have arranged home milrinone. Would repeat echo once PVCs suppressed for at least 4 weeks to see if any improvement in EF. Have referred back to [facility] to continue transplant w/u.   Glori Bickers, MD  12:00 PM

## 2022-04-25 NOTE — Progress Notes (Signed)
Peripherally Inserted Central Catheter Placement  The IV Nurse has discussed with the patient and/or persons authorized to consent for the patient, the purpose of this procedure and the potential benefits and risks involved with this procedure.  The benefits include less needle sticks, lab draws from the catheter, and the patient may be discharged home with the catheter. Risks include, but not limited to, infection, bleeding, blood clot (thrombus formation), and puncture of an artery; nerve damage and irregular heartbeat and possibility to perform a PICC exchange if needed/ordered by physician.  Alternatives to this procedure were also discussed.  Bard Power PICC patient education guide, fact sheet on infection prevention and patient information card has been provided to patient /or left at bedside.    PICC Placement Documentation  PICC Double Lumen 04/25/22 Right Brachial 41 cm 0 cm (Active)  Indication for Insertion or Continuance of Line Vasoactive infusions 04/25/22 1649  Exposed Catheter (cm) 0 cm 04/25/22 1649  Site Assessment Clean, Dry, Intact 04/25/22 1649  Lumen #1 Status Flushed;Blood return noted;Saline locked 04/25/22 1649  Lumen #2 Status Flushed;Blood return noted;Saline locked 04/25/22 1649  Dressing Type Transparent 04/25/22 1649  Dressing Status Antimicrobial disc in place 04/25/22 1649       [address]ty [address] 04/25/2022, 4:52 PM

## 2022-04-25 NOTE — H&P (Addendum)
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    Followed by Dr. [name]. Also followed closely at [facility] and see's Dr. Posey Pronto. Per last clinic note, 3/23, he had cardiopulmonary stress testing that was reassuring. Respiratory change ratio 1.37 with relative VO2 55% of predicted. Peak VO2 near 16. Chronically NYHA Class II. Has been on good GDMT. Not felt to need advanced therapies at time of last assessment by Dr. Posey Pronto.    Most recent Echo at [facility] 3/23, EF < 15%, RV moderately reduced, mild MR, moderate TR. It's not clear if he has ever had a cMRI.    Recently admitted to [facility] earlier this month w/ a/c CHF and volume overload. Hospitalization was c/b hypotension, requiring discontinuation of several HF medications. Lactate level was normal and renal function remained stable, w/ SCr maintaining at baseline. C/w warm/wet physiology. Echo showed EF < 20%, RV moderately reduced, mild MR and progression of TR to severe, GIIIDD (restrictive) and severe BAE. He was diuresed w/ IV Lasix and transitioned back to PO torsemide. Was continued on Jardiance and Coreg. Entresto, Sildenafil and Spironolactone discontinued given soft BP. D/c wt 194 lb. Referred to [employer] clinic.    Seen in [facility] clinic yesterday. Weight up 4 lb, 198 lb in clinic. ReDS 51% but out of proportion to exam. Complained of abdominal discomfort, early satiety, n/v.   D/t concern for low-output HF he was scheduled for RHC today.   RHC Findings:   RA = 7 RV = 48/16 PA = 54/28 (39) PCW = 29 (v =  35) Fick cardiac output/index = 3.4/1.6 PVR = 2.9 WU Ao sat = 95% PA sat = 53%, 53% PAPi = 3.7   Assessment: 1. Elevated filling pressures with low output 2. Frequent PVCs on tele   He is being direct admitted from cath lab for management of low-output HF and initiation of milrinone.   Review of Systems: [y] = yes, [ ]  = no   General: Weight gain [ ] ; Weight loss [Y]; Anorexia [ ] ; Fatigue [ ] ; Fever [ ] ; Chills [ ] ; Weakness [ ]   Cardiac: Chest pain/pressure [ ] ; Resting SOB [Y]; Exertional SOB [Y]; Orthopnea [ ] ; Pedal Edema [ ] ; Palpitations [ ] ; Syncope [ ] ; Presyncope [ ] ; Paroxysmal nocturnal dyspnea[ ]   Pulmonary: Cough [ ] ; Wheezing[ ] ; Hemoptysis[ ] ; Sputum [ ] ; Snoring [ ]   GI: Vomiting[Y]; Dysphagia[ ] ; Melena[ ] ; Hematochezia [ ] ; Heartburn[ ] ; Abdominal pain [Y]; Constipation [ ] ; Diarrhea [ ] ; BRBPR [ ]   GU: Hematuria[ ] ; Dysuria [ ] ; Nocturia[ ]   Vascular: Pain in legs with walking [ ] ; Pain in feet with lying flat [ ] ; Non-healing sores [ ] ; Stroke [ ] ; TIA [ ] ; Slurred speech [ ] ;  Neuro: Headaches[ ] ; Vertigo[ ] ; Seizures[ ] ; Paresthesias[ ] ;Blurred vision [ ] ; Diplopia [ ] ; Vision changes [ ]   Ortho/Skin: Arthritis [ ] ; Joint pain [ ] ; Muscle pain [ ] ; Joint swelling [ ] ; Back Pain [ ] ; Rash [ ]   Psych:  Depression[ ] ; Anxiety[ ]   Heme: Bleeding problems [ ] ; Clotting disorders [ ] ; Anemia [ ]   Endocrine: Diabetes [Y]; Thyroid dysfunction[ ]    Home Medications Prior to Admission medications   Medication Sig Start Date End Date Taking? Authorizing Provider  ACCU-CHEK GUIDE test strip Use as directed-patient needs an appt 07/18/21  Yes Koberlein, Steele Berg, MD  Accu-Chek Softclix Lancets lancets Use as directed 07/18/21  Yes Koberlein, Junell C, MD  albuterol (VENTOLIN HFA) 108 (90 Base) MCG/ACT inhaler Inhale 2 puffs into the lungs every 6 (six) hours as needed for wheezing or shortness of breath. 11/30/21  Yes Koberlein, Junell C, MD  blood glucose meter kit and  supplies KIT Dispense based on patient and insurance preference. Use up to four times daily as directed. 02/14/20  Yes Koberlein, Steele Berg, MD  Budeson-Glycopyrrol-Formoterol (BREZTRI AEROSPHERE) 160-9-4.8 MCG/ACT AERO Inhale 2 puffs into the lungs 2 (two) times daily. 02/06/22  Yes Koberlein, Junell C, MD  carvedilol (COREG) 6.25 MG tablet Take 1 tablet (6.25 mg total) by mouth 2 (two) times daily. 04/05/22  Yes Minus Breeding, MD  ELIQUIS 5 MG TABS tablet Take 1 tablet by mouth twice daily 09/28/21  Yes Koberlein, Steele Berg, MD  JARDIANCE 10 MG TABS tablet Take 1 tablet by mouth once daily 03/19/22  Yes Koberlein, Junell C, MD  omeprazole (PRILOSEC) 40 MG capsule Take 40 mg by mouth daily. 11/19/21  Yes [provider]  simvastatin (ZOCOR) 40 MG tablet TAKE 1 TABLET BY MOUTH AT BEDTIME 12/18/21  Yes Hochrein, Jeneen Rinks, MD  torsemide (DEMADEX) 20 MG tablet Take 2 tablets (40 mg total) by mouth daily. 10/05/21  Yes Minus Breeding, MD  Spacer/Aero-Holding Chambers (AEROCHAMBER PLUS) inhaler Use as instructed Patient not taking: Reported on 04/24/2022 05/25/21   Caren Macadam, MD    Past Medical History: Past Medical History:  Diagnosis Date   AICD (automatic cardioverter/defibrillator) present    Anxiety 08/20/2016   Barrett's esophagus 2002   Cardiomyopathy    Nonischemic. EF has been about 45%. S/P CABG.;  b.  Echo 4/14: EF 25%, global HK with inf and mid apical AK, restrictive physiology with E/e' > 15 (elevated LV filling pressure), trivial AI/MR, mild to mod LAE, mild RVE, mild reduced RVSF, mild RAE, mod TR, PASP 74 (severe pulmonary HTN)    CHF (congestive heart failure) (HCC)    CHF exacerbation (HCC) 07/31/2016   Colon polyps 2002   Coronary artery disease    Diabetes mellitus without complication (HCC)    Type II   Dyslipidemia    hx (03/20/2017)   GERD (gastroesophageal reflux disease)    History of bleeding peptic ulcer    History of echocardiogram    Echo 10/16:  EF  25-30%, poss non-compaction, diff HK with inf-lat and apical HK, restrictive physio, mild AI, severe LAE, mild RVE with mild reduced RVSF, PASP 42 mmHg   Hyperplastic rectal polyp    Moderate episode of recurrent major depressive disorder ([place]) 08/20/2016   09/14/20 - not current   OSA on CPAP    Mild   Pneumonia 09/2018   Tobacco abuse    Remote    Past Surgical History: Past Surgical History:  Procedure Laterality Date   CARDIAC CATHETERIZATION  2007;  2009   CARDIAC DEFIBRILLATOR PLACEMENT  03/20/2017   COLONOSCOPY WITH PROPOFOL N/A 01/21/2019   Procedure: COLONOSCOPY WITH PROPOFOL;  Surgeon: Milus Banister, MD;  Location: [facility];  Service: Endoscopy;  Laterality: N/A;   CORONARY ARTERY  BYPASS GRAFT  2007   Had a left main dissection after catheterization. Underwent a saphenous vein graft to the LAD and a saphenous vein graft to obtuse marginal.   HEMORRHOID SURGERY N/A 08/23/2020   Procedure: HEMORRHOIDECTOMY WITH LIGATION AND HEMORRHOIDOPEXY;  Surgeon: Michael Boston, MD;  Location: [facility];  Service: General;  Laterality: N/A;   ICD IMPLANT N/A 03/20/2017   Procedure: ICD Implant;  Surgeon: Constance Haw, MD;  Location: [place] [facility];  Service: Cardiovascular;  Laterality: N/A;   PATELLAR TENDON REPAIR Right 2005   PATELLAR TENDON REPAIR Right 09/21/2020   Procedure: PATELLA TENDON REPAIR;  Surgeon: Leandrew Koyanagi, MD;  Location: [place];  Service: Orthopedics;  Laterality: Right;   POLYPECTOMY  01/21/2019   Procedure: POLYPECTOMY;  Surgeon: Milus Banister, MD;  Location: [facility];  Service: Endoscopy;;   RECTAL EXAM UNDER ANESTHESIA Left 08/23/2020   Procedure: RECTAL EXAM UNDER ANESTHESIA;  Surgeon: Michael Boston, MD;  Location: [facility];  Service: General;  Laterality: Left;    Family History:  Family History  Problem Relation Age of Onset   Hypertension Mother    Osteoarthritis Mother    Heart failure Father 41   High blood pressure Father    Bipolar  disorder Daughter    Diabetes Daughter    Cancer Maternal Uncle        uncertain type   Cancer Maternal Grandmother        uncertain type   Diabetes Niece    Lung cancer Other    Asthma Neg Hx    Breast cancer Neg Hx     Social History: Social History   Socioeconomic History   Marital status: Significant Other    Spouse name: Not on file   Number of children: 5   Years of education: Not on file   Highest education level: High school graduate  Occupational History   Occupation: Civil engineer, contracting    Employer: [employer]    Comment: Disability 4 years  Tobacco Use   Smoking status: Former    Packs/day: 0.25    Years: 7.00    Total pack years: 1.75    Types: Cigarettes   Smokeless tobacco: Never   Tobacco comments:    03/20/2017 "quit in 1997  Vaping Use   Vaping Use: Former   Substances: [place]: vaped for about a month  Substance and Sexual Activity   Alcohol use: Not Currently    Comment: socially   Drug use: Yes    Frequency: 1.0 times per week    Types: Marijuana   Sexual activity: Yes  Other Topics Concern   Not on file  Social History Narrative   Single   Social Determinants of Health   Financial Resource Strain: Low Risk  (01/29/2022)   Overall Financial Resource Strain (CARDIA)    Difficulty of Paying Living Expenses: Not very hard  Food Insecurity: No Food Insecurity (04/17/2022)   Hunger Vital Sign    Worried About Running Out of Food in the Last Year: Never true    [place] in the Last Year: Never true  Transportation Needs: No Transportation Needs (04/17/2022)   PRAPARE - Hydrologist (Medical): No    Lack of Transportation (Non-Medical): No  Physical Activity: Inactive (11/20/2021)   Exercise Vital Sign    Days of Exercise per Week: 0 days    Minutes of Exercise per Session: 0 min  Stress: No Stress  Concern Present (11/20/2021)   [place]    Feeling of Stress : Only a little  Social Connections: Moderately Isolated (11/14/2020)   Social Connection and Isolation Panel [NHANES]    Frequency of Communication with Friends and Family: More than three times a week    Frequency of Social Gatherings with Friends and Family: Never    Attends Religious Services: More than 4 times per year    Active Member of Clubs or Organizations: No    Attends Music therapist: Never    Marital Status: Divorced    Allergies:  No Known Allergies  Objective:    Vital Signs:   Temp:  [98.1 F (36.7 C)] 98.1 F (36.7 C) (06/22 0830) Pulse Rate:  [44-74] 70 (06/22 1050) Resp:  [10-23] 11 (06/22 1050) BP: (86-102)/(63-75) 94/68 (06/22 1050) SpO2:  [97 %-100 %] 100 % (06/22 1050) Weight:  [88 kg] 88 kg (06/22 0830)   Filed Weights   04/25/22 0830  Weight: 88 kg     Physical Exam     General:  Lying comfortably in bed. HEENT: Normal Neck: Supple. JVP ~ 8 cm. Carotids 2+ bilat; no bruits.  Cor: PMI nondisplaced. Regular rate & rhythm. No rubs, gallops or murmurs. Lungs: Clear Abdomen: Soft, nontender, nondistended.  Extremities: No cyanosis, clubbing, rash, edema Neuro: Alert & oriented x 3, cranial nerves grossly intact. moves all 4 extremities w/o difficulty. Affect pleasant.   Labs     Basic Metabolic Panel: Recent Labs  Lab 04/24/22 1601  NA 135  K 3.9  CL 97*  CO2 26  GLUCOSE 117*  BUN 32*  CREATININE 1.76*  CALCIUM 9.6    Liver Function Tests: Recent Labs  Lab 04/24/22 1601  AST 23  ALT 22  ALKPHOS 72  BILITOT 1.2  PROT 7.2  ALBUMIN 4.0   No results for input(s): "LIPASE", "AMYLASE" in the last 168 hours. No results for input(s): "AMMONIA" in the last 168 hours.  CBC: Recent Labs  Lab 04/24/22 1601  WBC 5.0  HGB 14.5  HCT 45.8  MCV 79.5*  PLT 179    Cardiac Enzymes: No results for input(s): "CKTOTAL", "CKMB", "CKMBINDEX", "TROPONINI" in the last 168  hours.  BNP: BNP (last 3 results) Recent Labs    04/15/22 0933  BNP 1,220.8*    ProBNP (last 3 results) Recent Labs    05/30/21 0848 11/30/21 1301  PROBNP 519.0* 943.0*     CBG: Recent Labs  Lab 04/25/22 1055  GLUCAP 117*    Coagulation Studies: No results for input(s): "LABPROT", "INR" in the last 72 hours.  Imaging: CARDIAC CATHETERIZATION  Result Date: 04/25/2022 Findings: RA = 7 RV = 48/16 PA = 54/28 (39) PCW = 29 (v = 35) Fick cardiac output/index = 3.4/1.6 PVR = 2.9 WU Ao sat = 95% PA sat = 53%, 53% PAPi = 3.7 Assessment: 1. Elevated filling pressures with low output 2. Frequent PVCs on tele Plan/Discussion: Will admit for milrinone initiation. Place PICC. D/w Dr. [facility]y [facility] at [facility]. Will see soon after d/c (on home milrinone) to begin transplant w/u. Glori Bickers, MD 10:40 AM    Patient Profile   61 y.o. male with hx chronic systolic CHF/NICM s/p ICD, hx CABG after iatrogenic LM dissection during cath 2007, type II DM, AF, OSA on CPAP, PH, OSA on CPAP, CKD IIIa. Admitted with a/c systolic CHF with low-output   Assessment/Plan   Acute on Chronic Biventricular Heart Failure  with Low-output - NICM, ? Noncompaction. Has ICD  - followed closely by [facility] (Dr. Posey Pronto) - Echo 3/23 ([facility]) EF < 15%, RV moderately reduced  - CXP 3/23 ([facility]) Respiratory change ratio 1.37 with relative VO2 55% of predicted. Peak VO2 near 16. At the time, not felt to need advanced therapies.  - Admit to [facility] earlier this month for a/c CHF w/ volume overload. Hospitalization c/b hypotension and inability to tolerate GDMT.  - Feels poorly w/ increased dyspnea, N/V, NYHA Class IV symptoms.  - RHC today with elevated filling pressures and low CO.  - Admitted for milrinone initiation, start at 0.25 mcg/kg/min. Likely discharge on home milrinone. - Place PICC.  - Monitor CVP and CO-OX  - Has been tobacco free x 20 years. DM under good control. D/w Dr[facility]y Pronto at [facility]. Will follow-up at  [facility] after discharge to begin transplant workup. - Continue Jardiance 10 mg daily  - Off Entresto, Sildenafil and Spironolactone w/ recent hypotension  - Stop Coreg d/t low-output - Labs today   2. PAF - NSR on EKG yesterday - on Eliquis    3. H/o LM Coronary Dissection  - iatrogenic during diagnostic LHC in 2007  - s/p CABG    4. Pulmonary HTN - recent echo RVSP 53 mmHg  - suspect combination WHO Groups 2&3 - RHC this admit: RA 7, PA 54/28 (39), PCWP 29 (v 35), Fick CO/CI 3.4/1.6, PVR 2.9 WU, PAPi 3.7, PA sat 53% - + RV Failure - Sildenafil recently discontinued due to hypotension - continue diuretics and CPAP for OSA    5. Severe TR - likely functional in setting of dilated RA/RV   6. OSA - compliant w/ CPAP  - followed by Dr. Claiborne Billings    7. Type 2DM - last Hgb A1c 7.0 - on Jardiance - on statin, followed by PCP    8. CKD, Stage IIIa - b/l SCr ~1.6, 1.76 yesterday - on Jardiance - Labs today  9. PVCs - Noted on tele during [place] burden     FINCH, LINDSAY N, PA-C 04/25/2022, 10:59 AM  Advanced Heart Failure Team Pager [phone] (M-F; 7a - 5p)  Please contact [facility] for night-coverage after hours (4p -7a ) and weekends on [email or web address]  Patient seen and examined with the above-signed Advanced Practice Provider and/or Housestaff. I personally reviewed laboratory data, imaging studies and relevant notes. I independently examined the patient and formulated the important aspects of the plan. I have edited the note to reflect any of my changes or salient points. I have personally discussed the plan with the patient and/or family.  RHC today with low output and volume overload. Admitted for milrinone support and initiation of w/u for advanced therapies   General:  Weak appearing. No resp difficulty HEENT: normal Neck: supple. JVP 7-8 Carotids 2+ bilat; no bruits. No lymphadenopathy or thryomegaly appreciated. Cor: PMI nondisplaced. Regular rate & rhythm. 2/6  MR Lungs: clear Abdomen: soft, nontender, nondistended. No hepatosplenomegaly. No bruits or masses. Good bowel sounds. Extremities: no cyanosis, clubbing, rash, edema Neuro: alert & orientedx3, cranial nerves grossly intact. moves all 4 extremities w/o difficulty. Affect pleasant  Admit for IV milrinone and initiation of w/u for advanced therapies. I d/w Dr. Posey Pronto at [facility]. Goal will be to get him stabilized and home on milrinone with rapid f/u at [facility].   Glori Bickers, MD  5:08 PM

## 2022-04-25 NOTE — TOC Initial Note (Signed)
Transition of Care [facility]) - Initial/Assessment Note    Patient Details  Name: Bruce Robertson MRN: [id number] Date of Birth: 05/17/1961  [facility] CM/SW Contact:    Elliot Cousin, RN Phone Number: [phone] 04/25/2022, 5:09 PM  Clinical Narrative:                 HF TOC CM spoke to pt at bedside. States live in home with fiance. Offered choice for HH (provided medicare.gov list with ratings, placed copy in chart). Pt agreeable to [facility] for [facility]. Contacted rep, Victorino Dike with new referral. Contacted [facility] Coordinator, Pam with new referral for IV Milrinone in home. Pt had concerns about working part-time. Explained to discuss with attending.   Expected Discharge Plan: Home w Home Health Services Barriers to Discharge: Continued Medical Work up   Patient Goals and CMS Choice Patient states their goals for this hospitalization and ongoing recovery are:: wants to continue to be independent CMS Medicare.gov Compare Post Acute Care list provided to:: Patient Choice offered to / list presented to : Patient  Expected Discharge Plan and Services Expected Discharge Plan: Home w Home Health Services   Discharge Planning Services: CM Consult Post Acute Care Choice: Home Health Living arrangements for the past 2 months: Single Family Home                           HH Arranged: RN HH Agency: Surveyor, mining, [facility] Date [facility] Agency Contacted: 04/25/22 Time HH Agency Contacted: [id number] Representative spoke with at [facility] Agency: Trena Platt, Jeri Modena RN  Prior Living Arrangements/Services Living arrangements for the past 2 months: Single Family Home Lives with:: Significant Other Patient language and need for interpreter reviewed:: Yes Do you feel safe going back to the place where you live?: Yes      Need for Family Participation in Patient Care: No (Comment) Care giver support system in place?: No (comment) Current home services: DME  ([employer]) Criminal Activity/Legal Involvement Pertinent to Current Situation/Hospitalization: No - Comment as needed  Activities of Daily Living      Permission Sought/Granted Permission sought to share information with : Family Supports, PCP, Case Manager Permission granted to share information with : Yes, Verbal Permission Granted  Share Information with NAME: Sandria Bales  Permission granted to share info w AGENCY: Home Health  Permission granted to share info w Relationship: fiance  Permission granted to share info w Contact Information: [phone]  Emotional Assessment Appearance:: Appears younger than stated age Attitude/Demeanor/Rapport: Engaged Affect (typically observed): Accepting Orientation: : Oriented to Self, Oriented to Place, Oriented to  Time, Oriented to Situation   Psych Involvement: No (comment)  Admission diagnosis:  Acute on chronic systolic CHF (congestive heart failure) (HCC) [I50.23] Acute on chronic systolic (congestive) heart failure (HCC) [I50.23] Patient Active Problem List   Diagnosis Date Noted   Acute on chronic systolic CHF (congestive heart failure) (HCC) 04/25/2022   Acute on chronic systolic (congestive) heart failure (HCC) 04/25/2022   CHF exacerbation (HCC) 04/15/2022   Type 2 diabetes mellitus with complication, without long-term current use of insulin (HCC) 04/04/2022   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   Dizziness 12/31/2020   Patellar tendon rupture, right, initial encounter 09/13/2020   Restrictive lung disease 06/22/2020   BRBPR (bright red blood per rectum) 05/24/2020   Chronic systolic CHF (congestive heart failure) (HCC) 05/24/2020   Acute blood loss anemia 05/24/2020   Paroxysmal atrial fibrillation (HCC) 03/28/2020  Secondary hypercoagulable state (HCC) 03/28/2020   Educated about COVID-19 virus infection 02/10/2020   S/P ICD (internal cardiac defibrillator) procedure 07/18/2019   Nontraumatic complete tear of right  rotator cuff 06/29/2019   Pain in left elbow 06/29/2019   Prediabetes 06/10/2018   Thrombocytopenia (HCC) 06/10/2018   AICD (automatic cardioverter/defibrillator) present 06/10/2018   History of peptic ulcer disease 06/10/2018   NSVT (nonsustained ventricular tachycardia) (HCC)    Hypotension due to drugs 02/09/2018   Difficulty controlling anger 12/31/2016   Anxiety 08/20/2016   Moderate episode of recurrent major depressive disorder (HCC) 08/20/2016   Polyp of colon 04/29/2016   Shortness of breath 03/05/2016   Essential hypertension 10/31/2015   Acute on chronic systolic heart failure (HCC) 08/29/2015   Hx of CABG 08/29/2015   Stage 3 chronic kidney disease (HCC) 08/29/2015   OSA treated with BiPAP 06/10/2011   Fatigue 06/10/2011   Obesity (BMI 30-39.9) 05/02/2010   Hemorrhoids 02/08/2010   GERD (gastroesophageal reflux disease) 02/08/2010   Rectal bleeding 02/08/2010   Hyperlipidemia 04/06/2009   Cardiomyopathy, dilated, nonischemic (HCC) 04/06/2009   Non-ischemic cardiomyopathy (HCC) 04/06/2009   PCP:  Wynn Banker, MD (Inactive) Pharmacy:   [facility] [address] ([address]), [place] - [address] [place] ([place]) [place] [address] Phone: [phone] Fax: [phone]  [facility] [address] [place] [address] Phone: [phone] Fax: [phone]     Social Determinants of Health (SDOH) Interventions    Readmission Risk Interventions     No data to display

## 2022-04-26 ENCOUNTER — Encounter (HOSPITAL_COMMUNITY): Payer: Self-pay | Admitting: Internal Medicine

## 2022-04-26 DIAGNOSIS — I5023 Acute on chronic systolic (congestive) heart failure: Secondary | ICD-10-CM | POA: Diagnosis not present

## 2022-04-26 LAB — BASIC METABOLIC PANEL
Anion gap: 11 (ref 5–15)
BUN: 24 mg/dL — ABNORMAL HIGH (ref 6–20)
CO2: 27 mmol/L (ref 22–32)
Calcium: 9.1 mg/dL (ref 8.9–10.3)
Chloride: 93 mmol/L — ABNORMAL LOW (ref 98–111)
Creatinine, Ser: 1.55 mg/dL — ABNORMAL HIGH (ref 0.61–1.24)
GFR, Estimated: 51 mL/min — ABNORMAL LOW (ref >60)
Glucose, Bld: 179 mg/dL — ABNORMAL HIGH (ref 70–99)
Potassium: 3.6 mmol/L (ref 3.5–5.1)
Sodium: 131 mmol/L — ABNORMAL LOW (ref 135–145)

## 2022-04-26 LAB — COOXEMETRY PANEL
Carboxyhemoglobin: 1.4 % (ref 0.5–1.5)
Methemoglobin: <0.7 % (ref 0.0–1.5)
O2 Saturation: 63.5 %
Total hemoglobin: 13.4 g/dL (ref 12.0–16.0)

## 2022-04-26 LAB — TYPE AND SCREEN
ABO/RH(D): O POS
Antibody Screen: NEGATIVE

## 2022-04-26 MED ORDER — DIGOXIN 125 MCG PO TABS
0.1250 mg | ORAL_TABLET | Freq: Every day | ORAL | Status: DC
  Administered 2022-04-26 – 2022-04-29 (×4): 0.125 mg via ORAL
  Filled 2022-04-26 (×4): qty 1

## 2022-04-26 MED ORDER — MEXILETINE HCL 200 MG PO CAPS
200.0000 mg | ORAL_CAPSULE | Freq: Two times a day (BID) | ORAL | Status: DC
  Administered 2022-04-26 – 2022-04-27 (×3): 200 mg via ORAL
  Filled 2022-04-26 (×3): qty 1

## 2022-04-26 MED ORDER — POTASSIUM CHLORIDE CRYS ER 20 MEQ PO TBCR
40.0000 meq | EXTENDED_RELEASE_TABLET | Freq: Two times a day (BID) | ORAL | Status: DC
  Administered 2022-04-26 – 2022-04-27 (×3): 40 meq via ORAL
  Filled 2022-04-26 (×3): qty 2

## 2022-04-26 MED ORDER — SODIUM CHLORIDE 0.9% IV SOLUTION: Freq: Once | INTRAVENOUS | Status: DC

## 2022-04-27 DIAGNOSIS — I5023 Acute on chronic systolic (congestive) heart failure: Secondary | ICD-10-CM | POA: Diagnosis not present

## 2022-04-27 LAB — LIPOPROTEIN A (LPA): Lipoprotein (a): 218.7 nmol/L — ABNORMAL HIGH (ref <75.0)

## 2022-04-27 LAB — BASIC METABOLIC PANEL
Anion gap: 8 (ref 5–15)
BUN: 21 mg/dL — ABNORMAL HIGH (ref 6–20)
CO2: 27 mmol/L (ref 22–32)
Calcium: 9.4 mg/dL (ref 8.9–10.3)
Chloride: 98 mmol/L (ref 98–111)
Creatinine, Ser: 1.65 mg/dL — ABNORMAL HIGH (ref 0.61–1.24)
GFR, Estimated: 48 mL/min — ABNORMAL LOW (ref >60)
Glucose, Bld: 219 mg/dL — ABNORMAL HIGH (ref 70–99)
Potassium: 3.9 mmol/L (ref 3.5–5.1)
Sodium: 133 mmol/L — ABNORMAL LOW (ref 135–145)

## 2022-04-27 LAB — COOXEMETRY PANEL
Carboxyhemoglobin: 1.4 % (ref 0.5–1.5)
Methemoglobin: <0.7 % (ref 0.0–1.5)
O2 Saturation: 65.8 %
Total hemoglobin: 14 g/dL (ref 12.0–16.0)

## 2022-04-27 MED ORDER — POTASSIUM CHLORIDE CRYS ER 20 MEQ PO TBCR
40.0000 meq | EXTENDED_RELEASE_TABLET | Freq: Two times a day (BID) | ORAL | Status: DC
Start: 2022-04-27 — End: 2022-04-28
  Administered 2022-04-27: 40 meq via ORAL
  Filled 2022-04-27 (×2): qty 2

## 2022-04-27 MED ORDER — SPIRONOLACTONE 12.5 MG HALF TABLET
12.5000 mg | ORAL_TABLET | Freq: Every day | ORAL | Status: DC
  Administered 2022-04-27 – 2022-04-29 (×3): 12.5 mg via ORAL
  Filled 2022-04-27 (×3): qty 1

## 2022-04-27 MED ORDER — MEXILETINE HCL 150 MG PO CAPS
300.0000 mg | ORAL_CAPSULE | Freq: Two times a day (BID) | ORAL | Status: DC
  Administered 2022-04-27 – 2022-04-29 (×4): 300 mg via ORAL
  Filled 2022-04-27 (×4): qty 2

## 2022-04-27 MED ORDER — DICLOFENAC SODIUM 1 % EX GEL
2.0000 g | Freq: Two times a day (BID) | CUTANEOUS | Status: DC | PRN
  Administered 2022-04-27: 2 g via TOPICAL
  Filled 2022-04-27: qty 100

## 2022-04-28 DIAGNOSIS — I5023 Acute on chronic systolic (congestive) heart failure: Secondary | ICD-10-CM | POA: Diagnosis not present

## 2022-04-28 LAB — BASIC METABOLIC PANEL
Anion gap: 9 (ref 5–15)
BUN: 21 mg/dL — ABNORMAL HIGH (ref 6–20)
CO2: 25 mmol/L (ref 22–32)
Calcium: 9.6 mg/dL (ref 8.9–10.3)
Chloride: 101 mmol/L (ref 98–111)
Creatinine, Ser: 1.57 mg/dL — ABNORMAL HIGH (ref 0.61–1.24)
GFR, Estimated: 50 mL/min — ABNORMAL LOW (ref >60)
Glucose, Bld: 141 mg/dL — ABNORMAL HIGH (ref 70–99)
Potassium: 5.2 mmol/L — ABNORMAL HIGH (ref 3.5–5.1)
Sodium: 135 mmol/L (ref 135–145)

## 2022-04-28 LAB — COOXEMETRY PANEL
Carboxyhemoglobin: 1.4 % (ref 0.5–1.5)
Methemoglobin: <0.7 % (ref 0.0–1.5)
O2 Saturation: 57.9 %
Total hemoglobin: 14.9 g/dL (ref 12.0–16.0)

## 2022-04-28 MED ORDER — TORSEMIDE 20 MG PO TABS
40.0000 mg | ORAL_TABLET | Freq: Every day | ORAL | Status: DC
  Administered 2022-04-28: 40 mg via ORAL
  Filled 2022-04-28: qty 2

## 2022-04-28 MED ORDER — ALUM & MAG HYDROXIDE-SIMETH 200-200-20 MG/5ML PO SUSP
30.0000 mL | Freq: Four times a day (QID) | ORAL | Status: DC | PRN
  Administered 2022-04-29 (×3): 30 mL via ORAL
  Filled 2022-04-28 (×5): qty 30

## 2022-04-28 MED ORDER — FAMOTIDINE 20 MG PO TABS
20.0000 mg | ORAL_TABLET | Freq: Every day | ORAL | Status: DC
  Administered 2022-04-28 – 2022-04-29 (×2): 20 mg via ORAL
  Filled 2022-04-28 (×2): qty 1

## 2022-04-28 NOTE — Progress Notes (Signed)
Advanced Heart Failure Rounding Note  PCP-Cardiologist: Rollene Rotunda, MD   Subjective:    On milrinone 0.25. Co-ox 58% CVP 5-6  Feels good. Denies CP or SOB   PVC burden down to 1 per minute on mexilitene 300 bid Had 12-beat run NCVT overnight  Objective:   Weight Range: 86.7 kg Body mass index is 25.92 kg/m.   Vital Signs:   Temp:  [97.4 F (36.3 C)-98.6 F (37 C)] 97.4 F (36.3 C) (06/25 0747) Pulse Rate:  [72-84] 78 (06/25 1010) Resp:  [14-20] 14 (06/25 0747) BP: (92-108)/(69-79) 103/74 (06/25 0747) SpO2:  [96 %-98 %] 98 % (06/25 0747) Weight:  [86.7 kg] 86.7 kg (06/25 0422) Last BM Date : 04/24/22  Weight change: Filed Weights   04/26/22 0424 04/27/22 0509 04/28/22 0422  Weight: 88 kg 86.4 kg 86.7 kg    Intake/Output:   Intake/Output Summary (Last 24 hours) at 04/28/2022 1033 Last data filed at 04/28/2022 0800 Gross per 24 hour  Intake 1132.67 ml  Output 1375 ml  Net -242.33 ml       Physical Exam   General:  Well appearing. No resp difficulty HEENT: normal Neck: supple. no JVD. Carotids 2+ bilat; no bruits. No lymphadenopathy or thryomegaly appreciated. Cor: PMI nondisplaced. Regular rate & rhythm. No rubs, gallops or murmurs. Lungs: clear Abdomen: soft, nontender, nondistended. No hepatosplenomegaly. No bruits or masses. Good bowel sounds. Extremities: no cyanosis, clubbing, rash, edema Neuro: alert & orientedx3, cranial nerves grossly intact. moves all 4 extremities w/o difficulty. Affect pleasant  Telemetry   SR 60-70s with rare PVCs. 12 beat run NSVT Personally reviewed   Labs    CBC No results for input(s): "WBC", "NEUTROABS", "HGB", "HCT", "MCV", "PLT" in the last 72 hours.  Basic Metabolic Panel Recent Labs    82/95/62 0930 04/28/22 0435  NA 133* 135  K 3.9 5.2*  CL 98 101  CO2 27 25  GLUCOSE 219* 141*  BUN 21* 21*  CREATININE 1.65* 1.57*  CALCIUM 9.4 9.6    Liver Function Tests No results for input(s): "AST",  "ALT", "ALKPHOS", "BILITOT", "PROT", "ALBUMIN" in the last 72 hours.  No results for input(s): "LIPASE", "AMYLASE" in the last 72 hours. Cardiac Enzymes No results for input(s): "CKTOTAL", "CKMB", "CKMBINDEX", "TROPONINI" in the last 72 hours.  BNP: BNP (last 3 results) Recent Labs    04/15/22 0933  BNP 1,220.8*     ProBNP (last 3 results) Recent Labs    05/30/21 0848 11/30/21 1301  PROBNP 519.0* 943.0*      D-Dimer No results for input(s): "DDIMER" in the last 72 hours. Hemoglobin A1C No results for input(s): "HGBA1C" in the last 72 hours. Fasting Lipid Panel No results for input(s): "CHOL", "HDL", "LDLCALC", "TRIG", "CHOLHDL", "LDLDIRECT" in the last 72 hours. Thyroid Function Tests No results for input(s): "TSH", "T4TOTAL", "T3FREE", "THYROIDAB" in the last 72 hours.  Invalid input(s): "FREET3"  Other results:   Imaging    No results found.   Medications:     Scheduled Medications:  apixaban  5 mg Oral BID   Chlorhexidine Gluconate Cloth  6 each Topical Daily   digoxin  0.125 mg Oral Daily   empagliflozin  10 mg Oral Daily   mexiletine  300 mg Oral Q12H   simvastatin  40 mg Oral QHS   sodium chloride flush  10-40 mL Intracatheter Q12H   sodium chloride flush  3 mL Intravenous Q12H   sodium chloride flush  3 mL Intravenous Q12H  spironolactone  12.5 mg Oral Daily    Infusions:  sodium chloride     sodium chloride     milrinone 0.25 mcg/kg/min (04/27/22 2109)    PRN Medications: sodium chloride, sodium chloride, acetaminophen, diclofenac Sodium, ondansetron (ZOFRAN) IV, sodium chloride flush, sodium chloride flush    Patient Profile  61 y.o. male with hx chronic systolic CHF/NICM s/p ICD, hx CABG after iatrogenic LM dissection during cath 2007, type II DM, AF, OSA on CPAP, PH, OSA on CPAP, CKD IIIa. Admitted with a/c systolic CHF with low-output   Assessment/Plan   Acute on Chronic Biventricular Heart Failure with Low-output - NICM, ?  Noncompaction. Has ICD  - followed closely by the [facility] (Dr. Allena Katz) - Echo 3/23 ([facility]) EF < 15%, RV moderately reduced  - CXP 3/23 ([facility]) Respiratory change ratio 1.37 with relative VO2 55% of predicted. Peak VO2 near 16. At the time, not felt to need advanced therapies.  - Admit to [facility] earlier this month for a/c CHF w/ volume overload. Hospitalization c/b hypotension and inability to tolerate GDMT.  - NYHA IV - RHC with elevated left sided filling pressure PCWP 29 and low CO.  - Admitted for milrinone initiation, start at 0.25 mcg/kg/min. Likely discharge on home milrinone. PICC placed.  - CVP 5-6. Start po lasix - CO-OX stable 58% on Milrinone 0.25 mcg.  - Off IV lasix - Continue Jardiance 10 mg daily  - Continue spiro 12.5 Watch K (5.2 today) - Off carvedilol due to low output. 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 PVCs now decreased on mexilitene 300 bid. Suspect it would be reasonable to wait 4 weeks to see if EF improves with PVC suppression.  - Has been tobacco free x 20 years. DM under good control. D/w Dr. Allena Katz at [facility]. Will follow-up at [facility] after discharge to begin transplant workup. - Blood type O pos   2. PAF - Remains in NSR - on Eliquis    3. H/o LM Coronary Dissection  - iatrogenic during diagnostic LHC in 2007  - s/p CABG    4. 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 Now on milrinone - continue diuretics and CPAP for OSA    5. Severe TR - likely functional in setting of dilated RA/RV   6. OSA - compliant w/ CPAP  - followed by Dr. Tresa Endo    7. Type 2DM - last Hgb A1c 7.0 - on Jardiance - on statin, followed by PCP    8. CKD, Stage IIIa - b/l SCr ~1.6,  -  Scr stable at 1.57 K 5.2 - on Jardiance     9. PVCs/NSVT - Noted on tele during RHC - Mexilitene added 04/26/22 - PVCs suppressed on mexilitene 300 bid - Keep K > 4.0 Mg > 2.0    HH orders placed for home milrinone. If stable can go home tomorrow on IV milrinone  Length of Stay: 3  Arvilla Meres, MD  04/28/2022, 10:33 AM  Advanced Heart Failure Team Pager [phone] (M-F; 7a - 5p)  Please contact [facility] for night-coverage after hours (5p -7a ) and weekends on [email or web address]

## 2022-04-29 ENCOUNTER — Inpatient Hospital Stay (HOSPITAL_COMMUNITY): Payer: Medicare HMO

## 2022-04-29 ENCOUNTER — Other Ambulatory Visit (HOSPITAL_COMMUNITY): Payer: Self-pay

## 2022-04-29 DIAGNOSIS — I5023 Acute on chronic systolic (congestive) heart failure: Secondary | ICD-10-CM | POA: Diagnosis not present

## 2022-04-29 HISTORY — PX: IR FLUORO GUIDE CV LINE RIGHT: IMG2283

## 2022-04-29 HISTORY — PX: IR US GUIDE VASC ACCESS RIGHT: IMG2390

## 2022-04-29 LAB — COOXEMETRY PANEL
Carboxyhemoglobin: 1.5 % (ref 0.5–1.5)
Methemoglobin: <0.7 % (ref 0.0–1.5)
O2 Saturation: 60.2 %
Total hemoglobin: 15 g/dL (ref 12.0–16.0)

## 2022-04-29 LAB — MAGNESIUM: Magnesium: 2.7 mg/dL — ABNORMAL HIGH (ref 1.7–2.4)

## 2022-04-29 LAB — BASIC METABOLIC PANEL
Anion gap: 8 (ref 5–15)
BUN: 21 mg/dL — ABNORMAL HIGH (ref 6–20)
CO2: 26 mmol/L (ref 22–32)
Calcium: 9.1 mg/dL (ref 8.9–10.3)
Chloride: 99 mmol/L (ref 98–111)
Creatinine, Ser: 1.85 mg/dL — ABNORMAL HIGH (ref 0.61–1.24)
GFR, Estimated: 41 mL/min — ABNORMAL LOW (ref >60)
Glucose, Bld: 144 mg/dL — ABNORMAL HIGH (ref 70–99)
Potassium: 4.3 mmol/L (ref 3.5–5.1)
Sodium: 133 mmol/L — ABNORMAL LOW (ref 135–145)

## 2022-04-29 LAB — TSH: TSH: 1.873 u[IU]/mL (ref 0.350–4.500)

## 2022-04-29 MED ORDER — TORSEMIDE 20 MG PO TABS: 40.0000 mg | ORAL_TABLET | Freq: Every day | ORAL | 3 refills | Status: DC

## 2022-04-29 MED ORDER — AMIODARONE HCL 200 MG PO TABS: 200.0000 mg | ORAL_TABLET | Freq: Two times a day (BID) | ORAL | 6 refills | Status: DC

## 2022-04-29 MED ORDER — LIDOCAINE HCL 1 % IJ SOLN
INTRAMUSCULAR | Status: AC
  Filled 2022-04-29: qty 20

## 2022-04-29 MED ORDER — OMEPRAZOLE 40 MG PO CPDR: 40.0000 mg | DELAYED_RELEASE_CAPSULE | Freq: Every day | ORAL | 6 refills | Status: DC

## 2022-04-29 MED ORDER — TORSEMIDE 20 MG PO TABS: 20.0000 mg | ORAL_TABLET | Freq: Every day | ORAL | 3 refills | Status: DC

## 2022-04-29 MED ORDER — AMIODARONE HCL 200 MG PO TABS
400.0000 mg | ORAL_TABLET | Freq: Two times a day (BID) | ORAL | Status: DC
  Administered 2022-04-29: 400 mg via ORAL
  Filled 2022-04-29: qty 2

## 2022-04-29 MED ORDER — LIDOCAINE HCL (PF) 1 % IJ SOLN
INTRAMUSCULAR | Status: DC | PRN
  Administered 2022-04-29: 10 mL

## 2022-04-29 MED ORDER — DIGOXIN 125 MCG PO TABS: 0.1250 mg | ORAL_TABLET | Freq: Every day | ORAL | 6 refills | Status: DC

## 2022-04-29 MED ORDER — FAMOTIDINE 20 MG PO TABS
20.0000 mg | ORAL_TABLET | Freq: Every day | ORAL | 6 refills | Status: DC
  Filled 2022-04-29: qty 30, 30d supply, fill #0

## 2022-04-29 MED ORDER — SPIRONOLACTONE 25 MG PO TABS: 12.5000 mg | ORAL_TABLET | Freq: Every day | ORAL | 6 refills | Status: DC

## 2022-04-29 MED ORDER — AMIODARONE HCL 200 MG PO TABS: 200.0000 mg | ORAL_TABLET | Freq: Two times a day (BID) | ORAL | Status: DC

## 2022-04-29 MED ORDER — MILRINONE LACTATE IN DEXTROSE 20-5 MG/100ML-% IV SOLN: 0.2500 ug/kg/min | INTRAVENOUS | 32 refills | Status: DC

## 2022-04-29 MED ORDER — ATORVASTATIN CALCIUM 40 MG PO TABS
40.0000 mg | ORAL_TABLET | Freq: Every day | ORAL | Status: DC
  Administered 2022-04-29: 40 mg via ORAL
  Filled 2022-04-29: qty 1

## 2022-04-29 MED ORDER — ATORVASTATIN CALCIUM 40 MG PO TABS: 40.0000 mg | ORAL_TABLET | Freq: Every day | ORAL | 6 refills | Status: DC

## 2022-04-29 NOTE — Progress Notes (Signed)
Patient discharged with belongings.  Discharge education provided and patient stated understanding.

## 2022-04-29 NOTE — Progress Notes (Signed)
Patient verbalized concerns that his home omeprazole has been switched to Pepcid. He is is having intense amounts of heartburn and feels that it is because Pepcid does not work the same for him.  Maalox given. A few minutes later, patient put on call light asking for nurse for nausea. Zofran brought and given. About the same time, patient began vomiting green bile looking fluid into the garbage can.  Maalox again given this afternoon which seemed to curb the nausea. Zofran did not give much relief.  MD called and alerted patient would like Pepcid changed back to omeprazole.

## 2022-04-30 ENCOUNTER — Telehealth: Payer: Self-pay | Admitting: Family Medicine

## 2022-05-01 ENCOUNTER — Other Ambulatory Visit (HOSPITAL_COMMUNITY): Payer: Self-pay

## 2022-05-06 ENCOUNTER — Encounter: Payer: Self-pay | Admitting: Internal Medicine

## 2022-05-06 DIAGNOSIS — I5082 Biventricular heart failure: Secondary | ICD-10-CM | POA: Diagnosis not present

## 2022-05-06 DIAGNOSIS — Z79899 Other long term (current) drug therapy: Secondary | ICD-10-CM | POA: Diagnosis not present

## 2022-05-08 ENCOUNTER — Telehealth: Payer: Self-pay | Admitting: Pharmacist

## 2022-05-08 ENCOUNTER — Other Ambulatory Visit (HOSPITAL_COMMUNITY): Payer: Self-pay | Admitting: Cardiology

## 2022-05-08 NOTE — Telephone Encounter (Signed)
Opened in error

## 2022-05-08 NOTE — Chronic Care Management (AMB) (Signed)
    Chronic Care Management Pharmacy Assistant   Name: Bruce Robertson  MRN: [id number] DOB: 14-May-1961  Reason for Encounter: Follow up hospital discharge   Recent office visits:  None  Recent consult visits:  04/25/2022 Glori Bickers MD (cardiovascular) - Procedure - Right [place] visits:  Admitted to [facility] on 04/25/2022 due to Acute on chronic systolic congestive heart failure. Discharge date was 04/29/2022.   New?Medications Started at [facility] Discharge:?? amiodarone (PACERONE) atorvastatin (LIPITOR) digoxin (LANOXIN) milrinone (PRIMACOR) spironolactone (ALDACTONE) Medication Changes at Hospital Discharge: torsemide [facility]) Medications Discontinued at Hospital Discharge: carvedilol 6.25 MG tablet (COREG) simvastatin 40 MG tablet (ZOCOR) Medications that remain the same after Hospital Discharge:??  -All other medications will remain the same.    Medications: Outpatient Encounter Medications as of 05/08/2022  Medication Sig   ACCU-CHEK GUIDE test strip Use as directed-patient needs an appt   Accu-Chek Softclix Lancets lancets Use as directed   albuterol (VENTOLIN HFA) 108 (90 Base) MCG/ACT inhaler Inhale 2 puffs into the lungs every 6 (six) hours as needed for wheezing or shortness of breath.   amiodarone (PACERONE) 200 MG tablet Take 1 tablet (200 mg total) by mouth 2 (two) times daily.   atorvastatin (LIPITOR) 40 MG tablet Take 1 tablet (40 mg total) by mouth daily.   blood glucose meter kit and supplies KIT Dispense based on patient and insurance preference. Use up to four times daily as directed.   Budeson-Glycopyrrol-Formoterol (BREZTRI AEROSPHERE) 160-9-4.8 MCG/ACT AERO Inhale 2 puffs into the lungs 2 (two) times daily.   digoxin (LANOXIN) 0.125 MG tablet Take 1 tablet (0.125 mg total) by mouth daily.   ELIQUIS 5 MG TABS tablet Take 1 tablet by mouth twice daily   JARDIANCE 10 MG TABS tablet Take 1 tablet by mouth once daily    milrinone (PRIMACOR) 20 MG/100 ML SOLN infusion Inject 0.0212 mg/min into the vein continuous.   omeprazole (PRILOSEC) 40 MG capsule Take 1 capsule (40 mg total) by mouth daily.   Spacer/Aero-Holding Chambers (AEROCHAMBER PLUS) inhaler Use as instructed (Patient not taking: Reported on 04/24/2022)   spironolactone (ALDACTONE) 25 MG tablet Take 0.5 tablets (12.5 mg total) by mouth daily.   torsemide (DEMADEX) 20 MG tablet Take 1 tablet (20 mg total) by mouth daily.   No facility-administered encounter medications on file as of 05/08/2022.   Notes:  Called patient to follow up recent hospital stay.  Patient states he is feeling good and doing well.  He states he has a follow up visit with his cardiologist on 05/10/2022 and an appointment at [facility] on 05/23/2022.  His telephone appointment with Jeni Salles Clinical Pharmacist has been rescheduled to 05/22/2022.  Patient is aware he will need to call and schedule an office visit with a new PCP. He states he will do this today.   Care Gaps: AWV - scheduled 11/26/2022 Last BP - 102/80 on 04/24/2022 Last A1C - 7.0 on 04/15/2022 Foot exam - never done Eye exam - never done Shingrix - never done Covid booster - overdue Tdap - postponed  Star Rating Drugs: Jardiance 10 mg - last filled 02/12/2022 90 DS at [facility] Atorvastatin 40 mg - last filled 04/29/2022 30 DS at [place] Pharmacist Assistant [phone]

## 2022-05-08 NOTE — Progress Notes (Signed)
ADVANCED HF CLINIC CONSULT NOTE   Primary Care: Caren Macadam, MD (Inactive) Primary Cardiologist: Dr Minus Breeding Advanced Heart Failure ([facility], Dr. Posey Pronto) & Dr. Haroldine Laws EP: Dr. [facility]: Dr. Claiborne Billings     HPI: Bruce Robertson is a 62 y.o. 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    Followed by Dr. [name]. Also followed closely at [facility] and see's Dr. Posey Pronto. Per last clinic note, 3/23, he had cardiopulmonary stress testing that was reassuring. Respiratory change ratio 1.37 with relative VO2 55% of predicted. Peak VO2 near 16. Chronically NYHA Class II. Has been on good GDMT. Not felt to need advanced therapies at time of last assessment by Dr. Posey Pronto.    Echo at [facility] 3/23, EF < 15%, RV moderately reduced, mild MR, moderate TR. It's not clear if he has ever had a cMRI.    Admitted 6/23 w/ a/c CHF and volume overload. Hospitalization was c/b hypotension, requiring discontinuation of several HF medications. Lactate level was normal and renal function remained stable, w/ SCr maintaining at baseline. C/w warm/wet physiology. Echo showed EF < 20%, RV moderately reduced, mild MR and progression of TR to severe, GIIIDD (restrictive) and severe BAE. He was diuresed w/ IV Lasix and transitioned back to PO torsemide. Was continued on Jardiance and Coreg. Entresto, Sildenafil and Spironolactone discontinued given soft BP. D/c wt 194 lb. Referred to [facility] clinic.    Seen in [facility] 6/23, NYHA IIIb-IV symptoms. Concern for low output and arranged for RHC.  Admitted from cath lab 6/23 after RHC showed elevated left sided filling pressures and low output. Started on milrinone and diuresed with IV lasix. GDMT titrated. Mexilitene switched to amiodarone for better PVC  suppression. Discharged home with milrinone with [facility] follow up arranged, weight 186 lbs.  Today he returns for HF follow up. Overall feeling fine. He is not short of breath with ADLs or walking on flat ground, mild dyspnea walking up stairs. No issues with milrinone pump. Denies palpitations, abnormal bleeding, CP, dizziness, edema, or PND/Orthopnea. Appetite ok. No fever or chills. Weight at home 185-188 pounds. Taking all medications. Has follow up with Dr. Posey Pronto at [facility] in a couple weeks.     Cardiac Testing   - RHC (6/23):     RA = 7 RV = 48/16 PA = 54/28 (39) PCW = 29 (v = 35) Fick cardiac output/index = 3.4/1.6 PVR = 2.9 WU Ao sat = 95% PA sat = 53%, 53% PAPi = 3.7   1. Elevated filling pressures with low output 2. Frequent PVCs on tele   Plan/Discussion:  Will admit for milrinone initiation. Place PICC. D/w Dr. Posey Pronto at [facility]. Will see soon after d/c (on home milrinone) to begin transplant w/u.    - Echo (6/23): EF < 20%, severe LV dysfunction, global LV HK, grade III DD, RV moderately reduced, severe TR   - CPX ([facility]) 01/2022  1) The data suggests a maximal exercise test, so maximal cardiorespiratory    capacity could be determined.   2) Based on the peak oxygen consumption (VO2) achieved, functional capacity  would be consistent with a moderate functional impairment, with peak VO2  15.9 ml/kg/min (55% of predicted normals). Normative peak predicted VO2  values are corrected for age, gender, and  weight.   3) There were maximal signs of a circulatory limitation to exercise as  evidenced by blunted stroke volume and blood pressure as well as  ventilation-perfusion mismatch with exercise.  - The O2 pulse (an indicator of stroke volume) reached a plateau at HR of  100 bpm; peak O2 pulse was 11.54 mL/beat (67% predicted)  - Elevated VeVCO2 slope 34.2 (125% predicted) and decreased Oxygen Uptake  Efficiency Slope 1381 mL/min (50% predicted)  - ECG notable sinus rhythm at  rest with PVC's. Exercise ECG showed frequent  ventricular ectopy (PVCs, couplets, triplets, 4-5 beats NSVT).   4) Aerobic reserve was low-normal suggesting physical deconditioning may be  a potential limitation to exercise capacity.   5) Ventilatory reserve limits were approached at peak exercise. Normal  pulmonary physiology at rest. The MVV is normal. Exercise oscillatory  ventilation criteria was not met.  6) No prior CPET for comparison.    Review of Systems: [y] = yes, [ ] = no   General: Weight gain [ ]; Weight loss [ ]; Anorexia [ ]; Fatigue [ ]; Fever [ ]; Chills [ ]; Weakness [ ]  Cardiac: Chest pain/pressure [ ]; Resting SOB [ ]; Exertional SOB Blue.Reese ]; Orthopnea [ ]; Pedal Edema [ ]; Palpitations [ ]; Syncope [ ]; Presyncope [ ]; Paroxysmal nocturnal dyspnea[ ]  Pulmonary: Cough [ ]; Wheezing[ ]; Hemoptysis[ ]; Sputum [ ]; Snoring [ ]  GI: Vomiting[ ]; Dysphagia[ ]; Melena[ ]; Hematochezia [ ]; Heartburn[ ]; Abdominal pain [ ]; Constipation [ ]; Diarrhea [ ]; BRBPR [ ]  GU: Hematuria[ ]; Dysuria [ ]; Nocturia[ ]  Vascular: Pain in legs with walking [ ]; Pain in feet with lying flat [ ]; Non-healing sores [ ]; Stroke [ ]; TIA [ ]; Slurred speech [ ];  Neuro: Headaches[ ]; Vertigo[ ]; Seizures[ ]; Paresthesias[ ];Blurred vision [ ]; Diplopia [ ]; Vision changes [ ]  Ortho/Skin: Arthritis [ ]; Joint pain [ ]; Muscle pain [ ]; Joint swelling [ ]; Back Pain [ ]; Rash [ ]  Psych: Depression[ ]; Anxiety[ ]  Heme: Bleeding problems [ ]; Clotting disorders [ ]; Anemia [ ]  Endocrine: Diabetes Blue.Reese ]; Thyroid dysfunction[ ]   Past Medical History:  Diagnosis Date   AICD (automatic cardioverter/defibrillator) present    Anxiety 08/20/2016   Barrett's esophagus 2002   Cardiomyopathy    Nonischemic. EF has been about 45%. S/P CABG.;  b.  Echo 4/14: EF 25%, global HK with inf and mid apical AK, restrictive physiology with E/e' > 15 (elevated LV filling pressure), trivial AI/MR, mild to mod  LAE, mild RVE, mild reduced RVSF, mild RAE, mod TR, PASP 74 (severe pulmonary HTN)    CHF (congestive heart failure) (HCC)    CHF exacerbation (HCC) 07/31/2016   Colon polyps 2002   Coronary artery disease    Diabetes mellitus without complication (HCC)    Type II   Dyslipidemia    hx (03/20/2017)   GERD (gastroesophageal reflux disease)    History of bleeding peptic ulcer    History of echocardiogram    Echo 10/16:  EF 25-30%, poss non-compaction, diff HK with inf-lat and apical HK, restrictive physio, mild AI, severe LAE, mild RVE with mild reduced RVSF, PASP 42 mmHg   Hyperplastic rectal polyp    Moderate episode of recurrent major depressive disorder ([place]) 08/20/2016   09/14/20 - not current   OSA on CPAP    Mild   Pneumonia 09/2018  Tobacco abuse    Remote    Current Outpatient Medications  Medication Sig Dispense Refill   ACCU-CHEK GUIDE test strip Use as directed-patient needs an appt 100 each 1   Accu-Chek Softclix Lancets lancets Use as directed 100 each 1   albuterol (VENTOLIN HFA) 108 (90 Base) MCG/ACT inhaler Inhale 2 puffs into the lungs every 6 (six) hours as needed for wheezing or shortness of breath. 8 g 0   amiodarone (PACERONE) 200 MG tablet Take 1 tablet (200 mg total) by mouth 2 (two) times daily. 60 tablet 6   atorvastatin (LIPITOR) 40 MG tablet Take 1 tablet (40 mg total) by mouth daily. 30 tablet 6   blood glucose meter kit and supplies KIT Dispense based on patient and insurance preference. Use up to four times daily as directed. 1 each 0   Budeson-Glycopyrrol-Formoterol (BREZTRI AEROSPHERE) 160-9-4.8 MCG/ACT AERO Inhale 2 puffs into the lungs 2 (two) times daily. 32.1 g 3   digoxin (LANOXIN) 0.125 MG tablet Take 1 tablet (0.125 mg total) by mouth daily. 30 tablet 6   ELIQUIS 5 MG TABS tablet Take 1 tablet by mouth twice daily 180 tablet 3   JARDIANCE 10 MG TABS tablet Take 1 tablet by mouth once daily 90 tablet 0   milrinone (PRIMACOR) 20 MG/100 ML SOLN  infusion Inject 0.0212 mg/min into the vein continuous. 250 mL 32   omeprazole (PRILOSEC) 40 MG capsule Take 1 capsule (40 mg total) by mouth daily. 30 capsule 6   Spacer/Aero-Holding Chambers (AEROCHAMBER PLUS) inhaler Use as instructed (Patient taking differently: Use as instructed as needed) 1 each 0   spironolactone (ALDACTONE) 25 MG tablet Take 25 mg by mouth daily.     torsemide (DEMADEX) 20 MG tablet Take 1 tablet (20 mg total) by mouth daily. 180 tablet 3   No current facility-administered medications for this encounter.   No Known Allergies  Social History   Socioeconomic History   Marital status: Significant Other    Spouse name: Not on file   Number of children: 5   Years of education: Not on file   Highest education level: High school graduate  Occupational History   Occupation: Civil engineer, contracting    Employer: [employer]    Comment: Disability 4 years  Tobacco Use   Smoking status: Former    Packs/day: 0.25    Years: 7.00    Total pack years: 1.75    Types: Cigarettes   Smokeless tobacco: Never   Tobacco comments:    03/20/2017 "quit in 1997  Vaping Use   Vaping Use: Former   Substances: [place]: vaped for about a month  Substance and Sexual Activity   Alcohol use: Not Currently    Comment: socially   Drug use: Yes    Frequency: 1.0 times per week    Types: Marijuana   Sexual activity: Yes  Other Topics Concern   Not on file  Social History Narrative   Single   Social Determinants of Health   Financial Resource Strain: Low Risk  (01/29/2022)   Overall Financial Resource Strain (CARDIA)    Difficulty of Paying Living Expenses: Not very hard  Food Insecurity: No Food Insecurity (04/17/2022)   Hunger Vital Sign    Worried About Running Out of Food in the Last Year: Never true    [place] in the Last Year: Never true  Transportation Needs: No Transportation Needs (04/17/2022)   PRAPARE - Transportation    Lack of  Transportation (Medical): No     Lack of Transportation (Non-Medical): No  Physical Activity: Inactive (11/20/2021)   Exercise Vital Sign    Days of Exercise per Week: 0 days    Minutes of Exercise per Session: 0 min  Stress: No Stress Concern Present (11/20/2021)   [place]    Feeling of Stress : Only a little  Social Connections: Moderately Isolated (11/14/2020)   Social Connection and Isolation Panel [NHANES]    Frequency of Communication with Friends and Family: More than three times a week    Frequency of Social Gatherings with Friends and Family: Never    Attends Religious Services: More than 4 times per year    Active Member of [employer] or Organizations: No    Attends Archivist Meetings: Never    Marital Status: Divorced  Human resources officer Violence: Not At Risk (11/14/2020)   Humiliation, Afraid, Rape, and Kick questionnaire    Fear of Current or Ex-Partner: No    Emotionally Abused: No    Physically Abused: No    Sexually Abused: No    Family History  Problem Relation Age of Onset   Hypertension Mother    Osteoarthritis Mother    Heart failure Father 58   High blood pressure Father    Bipolar disorder Daughter    Diabetes Daughter    Cancer Maternal Uncle        uncertain type   Cancer Maternal Grandmother        uncertain type   Diabetes Niece    Lung cancer Other    Asthma Neg Hx    Breast cancer Neg Hx    BP 112/62   Pulse 63   Wt 87.7 kg (193 lb 6.4 oz)   SpO2 98%   BMI 26.23 kg/m   Wt Readings from Last 3 Encounters:  05/10/22 87.7 kg (193 lb 6.4 oz)  04/29/22 84.6 kg (186 lb 8.2 oz)  04/24/22 90.1 kg (198 lb 9.6 oz)   PHYSICAL EXAM: General:  NAD. No resp difficulty HEENT: Normal Neck: Supple. No JVD. Carotids 2+ bilat; no bruits. No lymphadenopathy or thryomegaly appreciated. Cor: PMI nondisplaced. Regular rate & rhythm. No rubs, gallops or murmurs. Lungs: Clear, R tunneled PICC looks ok Abdomen: Soft,  nontender, nondistended. No hepatosplenomegaly. No bruits or masses. Good bowel sounds. Extremities: No cyanosis, clubbing, rash, edema Neuro: Alert & oriented x 3, cranial nerves grossly intact. Moves all 4 extremities w/o difficulty. Affect pleasant.  ECG (personally reviewed): NSR + LVH 67 bpm  MDT ICD interrogation (personally reviewed): OptiVol down, thoracic impedence stable, 1-2 hrs daily activity, no recent AF  ASSESSMENT & PLAN: 1. Chronic Biventricular Heart Failure: - NICM, ? Noncompaction. Has ICD  - followed closely by the [facility] (Dr. Posey Pronto) - Echo 3/23 ([facility]) EF < 15%, RV moderately reduced  - CXP 3/23 ([facility]) Respiratory change ratio 1.37 with relative VO2 55% of predicted. Peak VO2 near 16. At the time, not felt to need advanced therapies.  - RHC (6/23): with elevated left sided filling pressure PCWP 29 and low CO.  - NYHA II, volume looks ok on exam and by OptiVol.  - Continue milrinone 0.25 mcg.  - Continue torsemide 20 mg daily. - Continue Jardiance 10 mg daily  - Continue spiro 25 mg daily. - Continue digoxin 0.125 mg daily. - Off carvedilol due to low output.  - Off Entresto due to low BP.  - ? 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 Mexiletine switched to amiodarone for better PVC suppression. See below. Reasonable to wait 4 weeks to see if EF improves with PVC suppression.  - Has been tobacco free x 20 years. DM under good control. He has follow up with Dr. Posey Pronto at [facility] in a couple of weeks for transplant w/u. - Blood type O pos. - Labs today.   2. PAF - Remains in NSR on ECG today. - Continue Eliquis, no bleeding issues. - CBC today.    3. H/o LM Coronary Dissection  - Iatrogenic during diagnostic LHC in 2007.  - s/p CABG.  - No chest pain   4. Pulmonary HTN - Echo RVSP 53 mmHg  - suspect combination WHO Groups 2&3 - RHC (6/23): RA 7, PA 54/28 (39), PCWP 29 (v 35), Fick CO/CI 3.4/1.6, PVR 2.9 WU, PAPi 3.7, PA sat 53% - + RV Failure - Sildenafil recently  discontinued due to hypotension. Now on milrinone. - Continue diuretics and CPAP for OSA.    5. Severe TR - Likely functional in setting of dilated RA/RV - Continue diuretics.   6. OSA - Compliant w/ CPAP  - Followed by Dr. Claiborne Billings. Advised calling Dr. Evette Georges office about CPAP machine troubleshooting.   7. Type 2DM - last Hgb A1c 7.0 - Continue Jardiance. - on statin, followed by PCP    8. CKD, Stage IIIa - Baseline SCr ~1.6  - on Jardiance. - BMET today.   9. PVCs/NSVT - Noted on tele during recent admit - mexiletine switched to amiodarone. - Suppressed on ECG today. - Continue amiodarone 200 mg bid (added due to higher burden of PVCs) - Baseline TSH and LFTs ok. - BMET and Mag today.   Follow up in 4-6 weeks with Dr. Haroldine Laws + repeat echo (to look for improved EF after PVC suppression and discuss next steps regarding transplant work up.)   [employer], FNP-BC 05/10/22

## 2022-05-09 ENCOUNTER — Telehealth: Payer: Self-pay | Admitting: Family Medicine

## 2022-05-09 NOTE — Telephone Encounter (Signed)
Social worker subsequent visit for next week

## 2022-05-10 ENCOUNTER — Encounter: Payer: Self-pay | Admitting: Internal Medicine

## 2022-05-10 ENCOUNTER — Encounter (HOSPITAL_COMMUNITY): Payer: Self-pay

## 2022-05-10 ENCOUNTER — Ambulatory Visit (HOSPITAL_COMMUNITY)
Admission: RE | Admit: 2022-05-10 | Discharge: 2022-05-10 | Disposition: A | Discharge status: Home / Self Care | Payer: Medicare HMO | Source: Ambulatory Visit | Attending: Family Medicine | Admitting: Family Medicine

## 2022-05-10 VITALS — BP 112/62 | HR 63 | Wt 193.4 lb

## 2022-05-10 DIAGNOSIS — I493 Ventricular premature depolarization: Secondary | ICD-10-CM | POA: Diagnosis not present

## 2022-05-10 DIAGNOSIS — I5082 Biventricular heart failure: Secondary | ICD-10-CM | POA: Diagnosis not present

## 2022-05-10 DIAGNOSIS — E1122 Type 2 diabetes mellitus with diabetic chronic kidney disease: Secondary | ICD-10-CM | POA: Insufficient documentation

## 2022-05-10 DIAGNOSIS — E118 Type 2 diabetes mellitus with unspecified complications: Secondary | ICD-10-CM

## 2022-05-10 DIAGNOSIS — Z9989 Dependence on other enabling machines and devices: Secondary | ICD-10-CM

## 2022-05-10 DIAGNOSIS — I5022 Chronic systolic (congestive) heart failure: Secondary | ICD-10-CM

## 2022-05-10 DIAGNOSIS — Z951 Presence of aortocoronary bypass graft: Secondary | ICD-10-CM | POA: Insufficient documentation

## 2022-05-10 DIAGNOSIS — G4733 Obstructive sleep apnea (adult) (pediatric): Secondary | ICD-10-CM | POA: Diagnosis not present

## 2022-05-10 DIAGNOSIS — N183 Chronic kidney disease, stage 3 unspecified: Secondary | ICD-10-CM | POA: Diagnosis not present

## 2022-05-10 DIAGNOSIS — Z7901 Long term (current) use of anticoagulants: Secondary | ICD-10-CM | POA: Diagnosis not present

## 2022-05-10 DIAGNOSIS — Z7984 Long term (current) use of oral hypoglycemic drugs: Secondary | ICD-10-CM | POA: Diagnosis not present

## 2022-05-10 DIAGNOSIS — Z79899 Other long term (current) drug therapy: Secondary | ICD-10-CM | POA: Insufficient documentation

## 2022-05-10 DIAGNOSIS — I472 Ventricular tachycardia, unspecified: Secondary | ICD-10-CM | POA: Insufficient documentation

## 2022-05-10 DIAGNOSIS — I272 Pulmonary hypertension, unspecified: Secondary | ICD-10-CM | POA: Insufficient documentation

## 2022-05-10 DIAGNOSIS — I071 Rheumatic tricuspid insufficiency: Secondary | ICD-10-CM

## 2022-05-10 DIAGNOSIS — I480 Paroxysmal atrial fibrillation: Secondary | ICD-10-CM | POA: Insufficient documentation

## 2022-05-10 DIAGNOSIS — N1831 Chronic kidney disease, stage 3a: Secondary | ICD-10-CM | POA: Insufficient documentation

## 2022-05-10 DIAGNOSIS — I959 Hypotension, unspecified: Secondary | ICD-10-CM | POA: Diagnosis not present

## 2022-05-10 LAB — BASIC METABOLIC PANEL
Anion gap: 10 (ref 5–15)
BUN: 19 mg/dL (ref 6–20)
CO2: 29 mmol/L (ref 22–32)
Calcium: 9.8 mg/dL (ref 8.9–10.3)
Chloride: 98 mmol/L (ref 98–111)
Creatinine, Ser: 1.72 mg/dL — ABNORMAL HIGH (ref 0.61–1.24)
GFR, Estimated: 45 mL/min — ABNORMAL LOW (ref >60)
Glucose, Bld: 117 mg/dL — ABNORMAL HIGH (ref 70–99)
Potassium: 4.1 mmol/L (ref 3.5–5.1)
Sodium: 137 mmol/L (ref 135–145)

## 2022-05-10 LAB — CBC
HCT: 44.5 % (ref 39.0–52.0)
Hemoglobin: 13.8 g/dL (ref 13.0–17.0)
MCH: 25.1 pg — ABNORMAL LOW (ref 26.0–34.0)
MCHC: 31 g/dL (ref 30.0–36.0)
MCV: 81.1 fL (ref 80.0–100.0)
Platelets: 180 10*3/uL (ref 150–400)
RBC: 5.49 MIL/uL (ref 4.22–5.81)
RDW: 15.6 % — ABNORMAL HIGH (ref 11.5–15.5)
WBC: 4.9 10*3/uL (ref 4.0–10.5)
nRBC: 0 % (ref 0.0–0.2)

## 2022-05-10 LAB — DIGOXIN LEVEL: Digoxin Level: 0.8 ng/mL (ref 0.8–2.0)

## 2022-05-10 LAB — MAGNESIUM: Magnesium: 2.9 mg/dL — ABNORMAL HIGH (ref 1.7–2.4)

## 2022-05-10 NOTE — Patient Instructions (Addendum)
EKG done today.  Labs done today. We will contact you only if your labs are abnormal.  No medication changes were made. Please continue all current medications as prescribed.  PLEASE CALL DR.KELLYS OFFICE FOR YOUR CPAP  Your physician recommends that you schedule a follow-up appointment in: 1 month with Dr.Bensimhon with an echo prior to your exam.  Your physician has requested that you have an echocardiogram. Echocardiography is a painless test that uses sound waves to create images of your heart. It provides your doctor with information about the size and shape of your heart and how well your heart's chambers and valves are working. This procedure takes approximately one hour. There are no restrictions for this procedure.  If you have any questions or concerns before your next appointment please send [place] a message through [place] or call our office at [phone].    TO LEAVE A MESSAGE FOR THE NURSE SELECT OPTION 2, PLEASE LEAVE A MESSAGE INCLUDING: YOUR NAME DATE OF BIRTH CALL BACK NUMBER REASON FOR CALL**this is important as we prioritize the call backs  YOU WILL RECEIVE A CALL BACK THE SAME DAY AS LONG AS YOU CALL BEFORE 4:00 PM   Do the following things EVERYDAY: Weigh yourself in the morning before breakfast. Write it down and keep it in a log. Take your medicines as prescribed Eat low salt foods--Limit salt (sodium) to 2000 mg per day.  Stay as active as you can everyday Limit all fluids for the day to less than 2 liters   At the [facility], you and your health needs are our priority. As part of our continuing mission to provide you with exceptional heart care, we have created designated Provider Care Teams. These Care Teams include your primary Cardiologist (physician) and Advanced Practice Providers (APPs- Physician Assistants and Nurse Practitioners) who all work together to provide you with the care you need, when you need it.   You may see any of the  following providers on your designated Care Team at your next follow up: Dr Glori Bickers Dr Haynes Kerns, NP Lyda Jester, [place] Audry Riles, PharmD   Please be sure to bring in all your medications bottles to every appointment.

## 2022-05-13 NOTE — Telephone Encounter (Signed)
Marissa calling for verification of verbal orders.

## 2022-05-14 NOTE — Telephone Encounter (Signed)
Left a detailed message on Bruce Robertson's voicemail to call back with detailed information as to the order requested.

## 2022-05-16 ENCOUNTER — Ambulatory Visit (INDEPENDENT_AMBULATORY_CARE_PROVIDER_SITE_OTHER): Payer: Medicare HMO

## 2022-05-16 DIAGNOSIS — I5022 Chronic systolic (congestive) heart failure: Secondary | ICD-10-CM | POA: Diagnosis not present

## 2022-05-17 LAB — CUP PACEART REMOTE DEVICE CHECK
Battery Remaining Longevity: 62 mo
Battery Voltage: 2.98 V
Brady Statistic RV Percent Paced: 0.01 %
Date Time Interrogation Session: 20230714115600
HighPow Impedance: 74 Ohm
Implantable Lead Implant Date: 20180517
Implantable Lead Location: 753860
Implantable Pulse Generator Implant Date: 20180517
Lead Channel Impedance Value: 342 Ohm
Lead Channel Impedance Value: 418 Ohm
Lead Channel Pacing Threshold Amplitude: 0.625 V
Lead Channel Pacing Threshold Pulse Width: 0.4 ms
Lead Channel Sensing Intrinsic Amplitude: 14.25 mV
Lead Channel Sensing Intrinsic Amplitude: 14.25 mV
Lead Channel Setting Pacing Amplitude: 2.5 V
Lead Channel Setting Pacing Pulse Width: 0.4 ms
Lead Channel Setting Sensing Sensitivity: 0.3 mV

## 2022-05-20 DIAGNOSIS — I271 Kyphoscoliotic heart disease: Secondary | ICD-10-CM | POA: Diagnosis not present

## 2022-05-20 DIAGNOSIS — I509 Heart failure, unspecified: Secondary | ICD-10-CM | POA: Diagnosis not present

## 2022-05-20 DIAGNOSIS — I480 Paroxysmal atrial fibrillation: Secondary | ICD-10-CM | POA: Diagnosis not present

## 2022-05-20 DIAGNOSIS — I5020 Unspecified systolic (congestive) heart failure: Secondary | ICD-10-CM | POA: Diagnosis not present

## 2022-05-20 DIAGNOSIS — I5082 Biventricular heart failure: Secondary | ICD-10-CM | POA: Diagnosis not present

## 2022-05-20 DIAGNOSIS — I272 Pulmonary hypertension, unspecified: Secondary | ICD-10-CM | POA: Diagnosis not present

## 2022-05-21 ENCOUNTER — Telehealth: Payer: Self-pay | Admitting: Pharmacist

## 2022-05-21 NOTE — Chronic Care Management (AMB) (Signed)
    Chronic Care Management Pharmacy Assistant   Name: Bruce Robertson  MRN: [id number] DOB: 12-24-1960  05/22/2022 APPOINTMENT REMINDER  [name] was reminded to have all medications, supplements and any blood glucose and blood pressure readings available for review with Jeni Salles, Pharm. D, at his telephone visit on 05/22/2022 at 9:00.  Care Gaps: AWV - scheduled 11/26/2022 Last BP - 86/54 on 04/05/2022 Last A1C - 7.0 on 04/15/2022 Foot exam - never done Eye exam - never done Shingrix - never done Covid booster - overdue Tdap - postponed  Star Rating Drug: Atorvastatin 40 mg - last filled 04/29/2022 30 DS at [facility] Jardiance 10 mg - last filled 05/18/2022 90 DS at [facility]  Any gaps in medications fill history? No  G[facility]ie [facility]  Catering manager [phone]

## 2022-05-21 NOTE — Progress Notes (Addendum)
Chronic Care Management Pharmacy Note  05/22/2022 Name:  Bruce Robertson MRN:  [id number] DOB:  [birth year]  Summary: A1c not ideally at goal < 7% LDL not ideally at goal < 70 Pt reports frequent vomiting (2-3 times a week)  Recommendations/Changes made from today's visit: -Recommended gastroenterology referral -Recommended repeat A1c -Recommended for patient to reach out to cardiology about dose change with torsemide  Plan: BP and HF assessment in 2 months Follow up in 4 months  Subjective: Bruce Robertson is an 61 y.o. year old male who is a primary patient of Koberlein, Steele Berg, MD (Inactive).  The CCM team was consulted for assistance with disease management and care coordination needs.    Engaged with patient by telephone for follow up visit in response to provider referral for pharmacy case management and/or care coordination services.   Consent to Services:  The patient was given information about Chronic Care Management services, agreed to services, and gave verbal consent prior to initiation of services.  Please see initial visit note for detailed documentation.   Patient Care Team: Caren Macadam, MD (Inactive) as PCP - General (Family Medicine) Constance Haw, MD as PCP - Electrophysiology (Cardiology) Minus Breeding, MD as PCP - [facility] Access (Cardiology) Minus Breeding, MD as PCP - Cardiology (Cardiology) Constance Haw, MD as Consulting Physician (Cardiology) Milus Banister, MD as Attending Physician (Gastroenterology) Michael Boston, MD as Consulting Physician (General Surgery) Riccardo Dubin, MD as Referring Physician (Cardiology) Viona Gilmore, [facility] as Pharmacist (Pharmacist) Haroldine Laws Shaune Pascal, MD as Consulting Physician (Cardiology)  Recent office visits: 01/04/2022 Micheline Rough MD - Patient was seen for Mass overlapping multiple quadrants of right breast and additional issues. No medication changes. No follow up noted.     11/30/2021 Micheline Rough MD - Patient was seen for shortness of breath and additional issues. Started Breztri 160/9/4.8 mcg/ACT twice daily. Discontinued Flexeril. No follow up noted.    11/20/2021 Glenna Durand LPN - Medicare annual wellness exam.  Recent consult visits: 05/10/22 Allena Katz, [place] (cardiology): Patient presented for CHF follow up. Follow up in 4-6 weeks.  04/25/2022 Glori Bickers MD (cardiovascular) - Procedure - Right Heart Cath.  04/24/22 Lyda Jester, PA-C (cardiology): Patient presented for CHF follow up. Follow up in 1 week.  04/05/2022 Minus Breeding MD (cardiology) - Patient was seen for Chronic combined systolic and diastolic heart failure and additional issues. Decreased Carvedilol to 6.25 mg twice daily. Discontinued Metoprolol. Follow up in 6 months.   03/12/2022 [name] MD(cardiology) - Patient was seen for Chronic systolic CHF (congestive heart failure) and an additional issue. Started Metoprolol 50 mg daily. Discontinued Carvedilol. Follow up in 6 months.   02/27/2022 Kara Mead (pulmonary) - Patient was seen for Obstructive sleep apnea. No additional chart notes.    02/14/2022 Roma Kayser RN [facility]) - Patient was seen for Cardiomyopathy, dilated, nonischemic and an additional issue. No medication changes. No follow up noted.   01/28/2022 Kara Mead (pulmonary) - Patient was seen for Obstructive sleep apnea. Prescribed prednisone taper. Recommended Zyrtec for pollen. Follow up in 3 months.  01/10/2022 Lattie Haw [facility] Cardiology) - Patient was seen for Chronic systolic CHF. No medications changes. Follow up in 6 months.    12/26/2021 Lenice Llamas MD (pulmonary) - Patient was seen for OSA treated with BiPAP and additional issues. No medication changes. Follow up in 2 weeks.    11/29/2021 Tommye Standard PA-C (cardiology) - Patient was seen for Paroxysmal atrial fibrillation and additional issues.  Decreased Carvedilol to 6.25 mg  twice daily. Follow up in 3 months.    11/22/2021 Lattie Haw [facility] Cardiology) - Patient was seen for Chronic systolic CHF. No medications changes. Follow up in 6 months.   11/15/2021 Will Camnitz (cardiology) - Patient was seen for Dilated cardiomyopathy. No other notes.    10/10/2021 Kara Mead (pulmonary) - Patient was seen for Obstructive sleep apnea. No other notes.    Hospital visits: Admitted to [facility] on 04/25/2022 due to Acute on chronic systolic congestive heart failure. Discharge date was 04/29/2022.   New?Medications Started at [facility] Discharge:?? amiodarone (PACERONE) atorvastatin (LIPITOR) digoxin (LANOXIN) milrinone (PRIMACOR) spironolactone (ALDACTONE) Medication Changes at Hospital Discharge: torsemide [facility]) Medications Discontinued at Hospital Discharge: carvedilol 6.25 MG tablet (COREG) simvastatin 40 MG tablet (ZOCOR) Medications that remain the same after Hospital Discharge:??  -All other medications will remain the same.    Admitted to [facility] on 04/15/2022 due to CHF exacerbation. Discharge date was 04/18/2022.    New?Medications Started at [facility] Discharge:?? No new medications Medication Changes at Hospital Discharge: No medication changes Medications Discontinued at Hospital Discharge: Entresto 24-26 MG (sacubitril-valsartan) sildenafil 50 MG tablet (VIAGRA) spironolactone 25 MG tablet (ALDACTONE) Medications that remain the same after Hospital Discharge:??  -All other medications will remain the same.   01/22/22 Patient presented to [facility] for acute upper respiratory infection and cough. Prescribed Flonase and benzonatate. Avoid steroids due to reduced EF.   Objective:  Lab Results  Component Value Date   CREATININE 1.72 (H) 05/10/2022   BUN 19 05/10/2022   GFR 47.04 (L) 01/03/2022   EGFR 47 (L) 10/05/2021   GFRNONAA 45 (L) 05/10/2022   GFRAA 42 (L)  07/24/2020   NA 137 05/10/2022   K 4.1 05/10/2022   CALCIUM 9.8 05/10/2022   CO2 29 05/10/2022   GLUCOSE 117 (H) 05/10/2022    Lab Results  Component Value Date/Time   HGBA1C 7.0 (H) 04/15/2022 02:35 PM   HGBA1C 7.0 (H) 11/30/2021 01:01 PM   GFR 47.04 (L) 01/03/2022 08:22 AM   GFR 41.42 (L) 11/30/2021 01:01 PM   MICROALBUR <0.7 11/30/2021 01:01 PM   MICROALBUR <0.7 12/01/2020 10:33 AM    Last diabetic Eye exam: No results found for: "HMDIABEYEEXA"  Last diabetic Foot exam: No results found for: "HMDIABFOOTEX"   Lab Results  Component Value Date   CHOL 141 11/30/2021   HDL 48.90 11/30/2021   LDLCALC 76 11/30/2021   LDLDIRECT 166.4 05/17/2010   TRIG 82.0 11/30/2021   CHOLHDL 3 11/30/2021       Latest Ref Rng & Units 04/24/2022    4:01 PM 01/03/2022    8:22 AM 11/30/2021    1:01 PM  Hepatic Function  Total Protein 6.5 - 8.1 g/dL 7.2  6.8  7.5   Albumin 3.5 - 5.0 g/dL 4.0  4.0  4.3   AST 15 - 41 U/L 23  23  23    ALT 0 - 44 U/L 22  20  20    Alk Phosphatase 38 - 126 U/L 72  57  57   Total Bilirubin 0.3 - 1.2 mg/dL 1.2  0.7  1.3     Lab Results  Component Value Date/Time   TSH 1.873 04/29/2022 08:57 AM   TSH 1.59 11/30/2021 01:01 PM   TSH 1.31 05/30/2021 08:48 AM   FREET4 1.29 11/30/2021 01:01 PM   FREET4 0.88 03/05/2021 08:29 AM       Latest  Ref Rng & Units 05/10/2022   11:21 AM 04/25/2022   10:07 AM 04/24/2022    4:01 PM  CBC  WBC 4.0 - 10.5 K/uL 4.9   5.0   Hemoglobin 13.0 - 17.0 g/dL 13.8  15.3    15.0  14.5   Hematocrit 39.0 - 52.0 % 44.5  45.0    44.0  45.8   Platelets 150 - 400 K/uL 180   179     Lab Results  Component Value Date/Time   VD25OH 32.91 12/01/2020 10:33 AM    Clinical ASCVD: No  The 10-year ASCVD risk score (Arnett DK, et al., 2019) is: 16.9%   Values used to calculate the score:     Age: 67 years     Sex: Male     Is Non-Hispanic African American: Yes     Diabetic: Yes     Tobacco smoker: No     Systolic Blood Pressure: 101 mmHg      Is BP treated: Yes     HDL Cholesterol: 48.9 mg/dL     Total Cholesterol: 141 mg/dL       11/30/2021   11:27 AM 11/20/2021    9:29 AM 12/01/2020   10:36 AM  Depression screen PHQ 2/9  Decreased Interest 0 0 1  Down, Depressed, Hopeless 1 0 1  PHQ - 2 Score 1 0 2  Altered sleeping 2  1  Tired, decreased energy 3  2  Change in appetite 0  0  Feeling bad or failure about yourself  0  1  Trouble concentrating 0  1  Moving slowly or fidgety/restless 0  1  Suicidal thoughts 0  0  PHQ-9 Score 6  8    CHA2DS2/VAS Stroke Risk Points  Current as of 11 minutes ago     4 >= 2 Points: High Risk  1 - 1.99 Points: Medium Risk  0 Points: Low Risk    Last Change: N/A      Details    This score determines the patient's risk of having a stroke if the  patient has atrial fibrillation.       Points Metrics  1 Has Congestive Heart Failure:  Yes    Current as of 11 minutes ago  1 Has Vascular Disease:  Yes     Current as of 11 minutes ago  1 Has Hypertension:  Yes    Current as of 11 minutes ago  0 Age:  41    Current as of 11 minutes ago  1 Has Diabetes:  Yes    Current as of 11 minutes ago  0 Had Stroke:  No  Had TIA:  No  Had Thromboembolism:  No    Current as of 11 minutes ago  0 Male:  No    Current as of 11 minutes ago         Social History   Tobacco Use  Smoking Status Former   Packs/day: 0.25   Years: 7.00   Total pack years: 1.75   Types: Cigarettes  Smokeless Tobacco Never  Tobacco Comments   03/20/2017 "quit in 1997   BP Readings from Last 3 Encounters:  05/10/22 112/62  04/29/22 102/73  04/24/22 102/80   Pulse Readings from Last 3 Encounters:  05/10/22 63  04/29/22 80  04/24/22 75   Wt Readings from Last 3 Encounters:  05/10/22 193 lb 6.4 oz (87.7 kg)  04/29/22 186 lb 8.2 oz (84.6 kg)  04/24/22 198 lb 9.6 oz (90.1  kg)   BMI Readings from Last 3 Encounters:  05/10/22 26.23 kg/m  04/29/22 25.30 kg/m  04/24/22 26.94 kg/m     Assessment/Interventions: Review of patient past medical history, allergies, medications, health status, including review of consultants reports, laboratory and other test data, was performed as part of comprehensive evaluation and provision of chronic care management services.   SDOH:  (Social Determinants of Health) assessments and interventions performed: Yes   SDOH Screenings   Alcohol Screen: Low Risk  (04/17/2022)   Alcohol Screen    Last Alcohol Screening Score (AUDIT): 1  Depression (PHQ2-9): Medium Risk (11/30/2021)   Depression (PHQ2-9)    PHQ-2 Score: 6  Financial Resource Strain: Low Risk  (01/29/2022)   Overall Financial Resource Strain (CARDIA)    Difficulty of Paying Living Expenses: Not very hard  Food Insecurity: No Food Insecurity (04/17/2022)   Hunger Vital Sign    Worried About Running Out of Food in the Last Year: Never true    Ran Out of Food in the Last Year: Never true  Housing: Low Risk  (04/17/2022)   Housing    Last Housing Risk Score: 0  Physical Activity: Inactive (11/20/2021)   Exercise Vital Sign    Days of Exercise per Week: 0 days    Minutes of Exercise per Session: 0 min  Social Connections: Moderately Isolated (11/14/2020)   Social Connection and Isolation Panel [NHANES]    Frequency of Communication with Friends and Family: More than three times a week    Frequency of Social Gatherings with Friends and Family: Never    Attends Religious Services: More than 4 times per year    Active Member of Clubs or Organizations: No    Attends Archivist Meetings: Never    Marital Status: Divorced  Stress: No Stress Concern Present (11/20/2021)   [employer] [place]    Feeling of Stress : Only a little  Tobacco Use: Medium Risk (05/10/2022)   Patient History    Smoking Tobacco Use: Former    Smokeless Tobacco Use: Never    Passive Exposure: Not on file  Transportation Needs: No  Transportation Needs (04/17/2022)   PRAPARE - Hydrologist (Medical): No    Lack of Transportation (Non-Medical): No    CCM Care Plan  No Known Allergies  Medications Reviewed Today     Reviewed by Rafael Bihari, FNP (Family Nurse Practitioner) on 05/10/22 at 1627  Med List Status: <None>   Medication Order Taking? Sig Documenting Provider Last Dose Status Informant  ACCU-CHEK GUIDE test strip [id number] Yes Use as directed-patient needs an appt Caren Macadam, MD Taking Active Self, Spouse/Significant Other, Pharmacy Records  Accu-Chek Softclix Lancets lancets [id number] Yes Use as directed Koberlein, Steele Berg, MD Taking Active Self, Spouse/Significant Other, Pharmacy Records  albuterol (VENTOLIN HFA) 108 (90 Base) MCG/ACT inhaler [id number] Yes Inhale 2 puffs into the lungs every 6 (six) hours as needed for wheezing or shortness of breath. Caren Macadam, MD Taking Active Self, Spouse/Significant Other, Pharmacy Records  amiodarone (PACERONE) 200 MG tablet [id number] Yes Take 1 tablet (200 mg total) by mouth 2 (two) times daily. Darrick Grinder D, NP Taking Active   atorvastatin (LIPITOR) 40 MG tablet [id number] Yes Take 1 tablet (40 mg total) by mouth daily. Darrick Grinder D, NP Taking Active   blood glucose meter kit and supplies KIT [id number] Yes Dispense based on patient and insurance preference. Use up to four  times daily as directed. Caren Macadam, MD Taking Active Self, Spouse/Significant Other, Pharmacy Records  Budeson-Glycopyrrol-Formoterol [facility] AEROSPHERE) 160-9-4.8 MCG/ACT Hollie Salk [id number] Yes Inhale 2 puffs into the lungs 2 (two) times daily. Caren Macadam, MD Taking Active Self, Spouse/Significant Other, Pharmacy Records  digoxin (LANOXIN) 0.125 MG tablet [id number] Yes Take 1 tablet (0.125 mg total) by mouth daily. Darrick Grinder D, NP Taking Active   ELIQUIS 5 MG TABS tablet [id number] Yes Take 1 tablet by mouth twice daily Koberlein,  Steele Berg, MD Taking Active Self, Spouse/Significant Other, Pharmacy Records  JARDIANCE 10 MG TABS tablet [id number] Yes Take 1 tablet by mouth once daily Koberlein, Steele Berg, MD Taking Active Self, Spouse/Significant Other, Pharmacy Records  milrinone (PRIMACOR) 20 MG/100 ML SOLN infusion [id number] Yes Inject 0.0212 mg/min into the vein continuous. Darrick Grinder D, NP Taking Active   omeprazole (PRILOSEC) 40 MG capsule [id number] Yes Take 1 capsule (40 mg total) by mouth daily. Conrad [place], NP Taking Active   Spacer/Aero-Holding Chambers (AEROCHAMBER PLUS) inhaler [id number] Yes Use as instructed  Patient taking differently: Use as instructed as needed   Koberlein, Steele Berg, MD Taking Active Self, Spouse/Significant Other, Pharmacy Records  spironolactone (ALDACTONE) 25 MG tablet [id number] Yes Take 25 mg by mouth daily. [provider] Taking Active   torsemide (DEMADEX) 20 MG tablet [id number] Yes Take 1 tablet (20 mg total) by mouth daily. 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) 05/24/2020   Acute blood loss anemia 05/24/2020   Paroxysmal atrial fibrillation ([place]) 03/28/2020   Secondary hypercoagulable state ([place]) 03/28/2020   Educated about COVID-19 virus infection 02/10/2020   S/P ICD (internal cardiac defibrillator) procedure 07/18/2019   Nontraumatic complete tear of right  rotator cuff 06/29/2019   Pain in left elbow 06/29/2019   Prediabetes 06/10/2018   Thrombocytopenia ([place]) 06/10/2018   AICD (automatic cardioverter/defibrillator) present 06/10/2018   History of peptic ulcer disease 06/10/2018   NSVT (nonsustained ventricular tachycardia) (HCC)    Hypotension due to drugs 02/09/2018   Difficulty controlling anger 12/31/2016   Anxiety 08/20/2016   Moderate episode of recurrent major depressive disorder ([place]) 08/20/2016   Polyp of colon 04/29/2016   Shortness of breath 03/05/2016   Essential hypertension 10/31/2015   Acute on chronic systolic heart failure ([place]) 08/29/2015   Hx of CABG 08/29/2015   Stage 3 chronic kidney disease ([place]) 08/29/2015   OSA treated with BiPAP 06/10/2011   Fatigue 06/10/2011   Obesity (BMI 30-39.9) 05/02/2010   Hemorrhoids 02/08/2010   GERD (gastroesophageal reflux disease) 02/08/2010   Rectal bleeding 02/08/2010   Hyperlipidemia 04/06/2009   Cardiomyopathy, dilated, nonischemic ([place]) 04/06/2009   Non-ischemic cardiomyopathy ([place]) 04/06/2009    Immunization History  Administered Date(s) Administered   Influenza,inj,Quad PF,6+ Mos 11/25/2018, 08/16/2021   Influenza-Unspecified 08/23/2019   PFIZER Comirnaty(Gray Top)Covid-19 Tri-Sucrose Vaccine 05/12/2020, 06/03/2020   PFIZER(Purple Top)SARS-COV-2 Vaccination 05/12/2020, 06/03/2020  Pneumococcal Conjugate-13 11/25/2018   Patient reports he has a tremor that started with his medication changes. Discussed how this could be caused by amiodarone or digoxin and to discuss further with cardiology.  Patient reports he has been more active and only notices some shortness of breath with minutes of activity. His weight has been up a couple of pounds from one day to the next and now he is taking 2 of the torsemide per day, despite the prescription written for 1 per day. Patient reports his hands got tight a couple of days but no other signs of swelling. He is moving around a lot  better.  Patient reports his appetite isn't as good since the hospital and sometimes he craves sugar during the night. Before bedtime he usually eats salads or cucumbers or other vegetables but does not check his blood sugar before bedtime. Patient also reports he has been vomiting a couple times a week, which was going on pre hospital. At first he thought it was the foods he was eating but now he's not sure what's wrong.    Conditions to be addressed/monitored:  Hypertension, Hyperlipidemia, Atrial Fibrillation, Heart Failure, GERD, and OSA  Conditions addressed this visit: Hypertension, diabetes, heart failure  Care Plan : CCM Pharmacy Care Plan  Updates made by Viona Gilmore, [place] since 05/22/2022 12:00 AM     Problem: Problem: Hypertension, Hyperlipidemia, Atrial Fibrillation, Heart Failure, GERD, Anxiety, and OSA      Long-Range Goal: Patient-Specific Goal   Start Date: 01/29/2022  Expected End Date: 01/30/2023  Recent Progress: On track  Priority: High  Note:   Current Barriers:  Unable to independently afford treatment regimen Unable to independently monitor therapeutic efficacy Unable to maintain control of diabetes  Pharmacist Clinical Goal(s):  Patient will verbalize ability to afford treatment regimen achieve adherence to monitoring guidelines and medication adherence to achieve therapeutic efficacy through collaboration with PharmD and provider.   Interventions: 1:1 collaboration with Caren Macadam, MD regarding development and update of comprehensive plan of care as evidenced by provider attestation and co-signature Inter-disciplinary care team collaboration (see longitudinal plan of care) Comprehensive medication review performed; medication list updated in electronic medical record  Hypertension (BP goal <130/80) -Controlled -Current treatment: Spironolactone 25 mg 1 tablet daily - Appropriate, Effective, Safe, Accessible -Medications previously tried:  carvedilol (hypotension), Entresto (hypotension) -Current home readings: 106/73 (lowest since hospital), 107-115/69-76  -Current dietary habits: limits salt intake -Current exercise habits: exercising now more regularly -Reports hypotensive/hypertensive symptoms -Educated on BP goals and benefits of medications for prevention of heart attack, stroke and kidney damage; Proper BP monitoring technique; Symptoms of hypotension and importance of maintaining adequate hydration; -Counseled to monitor BP at home a few times a week, document, and provide log at future appointments -Counseled on diet and exercise extensively Recommended to continue current medication  Hyperlipidemia: (LDL goal < 70) -Not ideally controlled -Current treatment: Atorvastatin 40 mg 1 tablet daily - Appropriate, Query effective, Safe, Accessible -Medications previously tried: simvastatin (drug interaction) -Current dietary patterns: eats very little red meat; mostly chicken and fish -Current exercise habits: going to the gym more now -Educated on Cholesterol goals;  Benefits of statin for ASCVD risk reduction; Importance of limiting foods high in cholesterol; Exercise goal of 150 minutes per week; -Counseled on diet and exercise extensively Recommended to continue current medication  Diabetes (A1c goal <7%) -Not ideally controlled -Current medications: Jardiance 10 mg 1 tablet daily - Appropriate, Query effective, Safe, Accessible -Medications previously tried: none  -  Current home glucose readings fasting glucose: 115-117 (varies time of day and checks a couple times a week) post prandial glucose: n/a -Denies hypoglycemic/hyperglycemic symptoms -Current meal patterns: not eating as much since hospital visit breakfast: did not discuss  lunch: did not discuss   dinner: did not discuss  snacks: did not discuss  drinks: did not discuss  -Current exercise: going to the gym now -Educated on A1c and blood sugar  goals; Benefits of routine self-monitoring of blood sugar; Carbohydrate counting and/or plate method -Counseled to check feet daily and get yearly eye exams -Counseled on diet and exercise extensively Recommended repeat A1c.  Atrial Fibrillation (Goal: prevent stroke and major bleeding) -Controlled -CHADSVASC: 4 -Current treatment: Rhythm control: amiodarone 200 mg 1 tablet twice daily ; digoxin 0.125 mg 1 tablet daily - Appropriate, Effective, Safe, Accessible Anticoagulation: Eliquis 5 mg 1 tablet twice daily - Appropriate, Effective, Safe, Accessible -Medications previously tried: none -Home BP and HR readings: refer to above  -Counseled on increased risk of stroke due to Afib and benefits of anticoagulation for stroke prevention; importance of adherence to anticoagulant exactly as prescribed; bleeding risk associated with Eliquis and importance of self-monitoring for signs/symptoms of bleeding; avoidance of NSAIDs due to increased bleeding risk with anticoagulants; -Recommended to continue current medication  Heart Failure (Goal: manage symptoms and prevent exacerbations) -Controlled -Last ejection fraction: 15-20% (Date: 02/04/20) -HF type: Systolic -NYHA Class: III (marked limitation of activity) -AHA HF Stage: C (Heart disease and symptoms present) -Current treatment: Amiodarone 200 mg 1 tablet twice daily - Appropriate, Effective, Safe, Accessible Digoxin 0.125 mg 1 tablet daily - Appropriate, Effective, Safe, Accessible Spironolactone 25 mg 1 tablet daily - Appropriate, Effective, Safe, Accessible Jardiance 10 mg 1 tablet daily - Appropriate, Effective, Safe, Accessible Torsemide 20 mg 2 tablets daily - Appropriate, Effective, Safe, Accessible -Medications previously tried: none  -Current home BP/HR readings: refer to above -Current dietary habits: eats as little sodium as possible; doesn't use salt at home, fresh or frozen; avoids things > 100 mg of sodium per serving; asks  about sodium content in restaurants -Current exercise habits: going to the gym regularly now -Educated on Benefits of medications for managing symptoms and prolonging life Importance of weighing daily; if you gain more than 3 pounds in one day or 5 pounds in one week, call cardiologist. -Counseled on diet and exercise extensively Recommended to continue current medication -Recommended reaching out to cardiology about dose change with torsemide.  OSA (Goal: control symptoms and prevent exacerbations) -Controlled -Current treatment  Breztri 160-9-4.8 mcg/act 2 puffs twice daily - taking once daily-  Appropriate, Effective, Safe, Accessible Albuterol HFA as needed - Appropriate, Effective, Safe, Accessible -Medications previously tried: none  -Gold Grade: Gold 2 (FEV1 50-79%) -Current COPD Classification:  B (high sx, <2 exacerbations/yr) -MMRC/CAT score: n/a -Pulmonary function testing: 03/2020 -Exacerbations requiring treatment in last 6 months: none -Patient reports consistent use of maintenance inhaler -Frequency of rescue inhaler use: uses very infrequently -Counseled on Proper inhaler technique; Benefits of consistent maintenance inhaler use When to use rescue inhaler -Recommended to continue current medication  GERD (Goal: minimize symptoms) -Controlled -Current treatment  Omeprazole 40 mg 1 capsule daily - Appropriate, Effective, Safe, Accessible -Medications previously tried: none  -Counseled on non-pharmacologic management of symptoms such as elevating the head of your bed, avoiding eating 2-3 hours before bed, avoiding triggering foods such as acidic, spicy, or fatty foods, eating smaller meals, and wearing clothes that are loose around the waist.  Health Maintenance -Vaccine  gaps:  shingrix, COVID booster, tetanus -Current therapy:  Multivitamin 1 tablet daily Sildenafil 50 mg 1 tablet as needed -Educated on Cost vs benefit of each product must be carefully weighed by  individual consumer -Patient is satisfied with current therapy and denies issues -Recommended to continue current medication  Patient Goals/Self-Care Activities Patient will:  - check glucose a couple times a week, document, and provide at future appointments check blood pressure a couple times a week, document, and provide at future appointments weigh daily, and contact provider if weight gain of > 3 lbs in one day or > 5 lbs in one week  Follow Up Plan: The care management team will reach out to the patient again over the next 7 days.         Medication Assistance:  Breztri obtained through [place] medication [place].  Enrollment ends 11/03/22  Compliance/Adherence/Medication fill history: Care Gaps: Shingrix, COVID booster, tetanus, foot exam, eye exam Last BP - 86/54 on 04/05/2022 Last A1C - 7.0 on 04/15/2022  Star-Rating Drugs: Atorvastatin 40 mg - last filled 04/29/2022 30 DS at [facility] Jardiance 10 mg - last filled 05/18/2022 90 DS at [place]  Patient's preferred pharmacy is:  [facility] [address] [place] [address] Phone: [phone] Fax: [phone]  Uses pill box? Yes - weekly  Pt endorses 99% compliance - might miss evening dose with medications  We discussed: Current pharmacy is preferred with insurance plan and patient is satisfied with pharmacy services Patient decided to: Continue current medication management strategy  Care Plan and Follow Up Patient Decision:  Patient agrees to Care Plan and Follow-up.  Plan: The care management team will reach out to the patient again over the next 7 days.  Jeni Salles, PharmD, [facility] Clinical Pharmacist [place] [facility] [place]   Thanks Watt Climes. Agree with above. Please advise and assist patient with inperson visit with physician to address the vomiting. Thanks!   Lucretia Kern, DO

## 2022-05-22 ENCOUNTER — Ambulatory Visit: Payer: Medicare HMO | Admitting: Pharmacist

## 2022-05-22 DIAGNOSIS — I5022 Chronic systolic (congestive) heart failure: Secondary | ICD-10-CM

## 2022-05-22 DIAGNOSIS — E1165 Type 2 diabetes mellitus with hyperglycemia: Secondary | ICD-10-CM

## 2022-05-22 NOTE — Patient Instructions (Signed)
Hi Dishon,  It was great to catch up again! I am glad you are feeling better since the hospital. Please don't forget to call cardiology about your doubling up on the torsemide - they will want to know and update the prescription.   Please reach out to me if you have any questions or need anything before our follow up!  Best, Maddie  Jeni Salles, PharmD, [place] [facility] [place]   Visit Information   Goals Addressed   None    Patient Care Plan: CCM Pharmacy Care Plan     Problem Identified: Problem: Hypertension, Hyperlipidemia, Atrial Fibrillation, Heart Failure, GERD, Anxiety, and OSA      Long-Range Goal: Patient-Specific Goal   Start Date: 01/29/2022  Expected End Date: 01/30/2023  Recent Progress: On track  Priority: High  Note:   Current Barriers:  Unable to independently afford treatment regimen Unable to independently monitor therapeutic efficacy Unable to maintain control of diabetes  Pharmacist Clinical Goal(s):  Patient will verbalize ability to afford treatment regimen achieve adherence to monitoring guidelines and medication adherence to achieve therapeutic efficacy through collaboration with PharmD and provider.   Interventions: 1:1 collaboration with Caren Macadam, MD regarding development and update of comprehensive plan of care as evidenced by provider attestation and co-signature Inter-disciplinary care team collaboration (see longitudinal plan of care) Comprehensive medication review performed; medication list updated in electronic medical record  Hypertension (BP goal <130/80) -Controlled -Current treatment: Spironolactone 25 mg 1 tablet daily - Appropriate, Effective, Safe, Accessible -Medications previously tried: carvedilol (hypotension), Entresto (hypotension) -Current home readings: 106/73 (lowest since hospital), 107-115/69-76  -Current dietary habits: limits salt intake -Current  exercise habits: exercising now more regularly -Reports hypotensive/hypertensive symptoms -Educated on BP goals and benefits of medications for prevention of heart attack, stroke and kidney damage; Proper BP monitoring technique; Symptoms of hypotension and importance of maintaining adequate hydration; -Counseled to monitor BP at home a few times a week, document, and provide log at future appointments -Counseled on diet and exercise extensively Recommended to continue current medication  Hyperlipidemia: (LDL goal < 70) -Not ideally controlled -Current treatment: Atorvastatin 40 mg 1 tablet daily - Appropriate, Query effective, Safe, Accessible -Medications previously tried: simvastatin (drug interaction) -Current dietary patterns: eats very little red meat; mostly chicken and fish -Current exercise habits: going to the gym more now -Educated on Cholesterol goals;  Benefits of statin for ASCVD risk reduction; Importance of limiting foods high in cholesterol; Exercise goal of 150 minutes per week; -Counseled on diet and exercise extensively Recommended to continue current medication  Diabetes (A1c goal <7%) -Not ideally controlled -Current medications: Jardiance 10 mg 1 tablet daily - Appropriate, Query effective, Safe, Accessible -Medications previously tried: none  -Current home glucose readings fasting glucose: 115-117 (varies time of day and checks a couple times a week) post prandial glucose: n/a -Denies hypoglycemic/hyperglycemic symptoms -Current meal patterns: not eating as much since hospital visit breakfast: did not discuss  lunch: did not discuss   dinner: did not discuss  snacks: did not discuss  drinks: did not discuss  -Current exercise: going to the gym now -Educated on A1c and blood sugar goals; Benefits of routine self-monitoring of blood sugar; Carbohydrate counting and/or plate method -Counseled to check feet daily and get yearly eye exams -Counseled on  diet and exercise extensively Recommended repeat A1c.  Atrial Fibrillation (Goal: prevent stroke and major bleeding) -Controlled -CHADSVASC: 4 -Current treatment: Rhythm control: amiodarone 200 mg 1 tablet twice daily ;  digoxin 0.125 mg 1 tablet daily - Appropriate, Effective, Safe, Accessible Anticoagulation: Eliquis 5 mg 1 tablet twice daily - Appropriate, Effective, Safe, Accessible -Medications previously tried: none -Home BP and HR readings: refer to above  -Counseled on increased risk of stroke due to Afib and benefits of anticoagulation for stroke prevention; importance of adherence to anticoagulant exactly as prescribed; bleeding risk associated with Eliquis and importance of self-monitoring for signs/symptoms of bleeding; avoidance of NSAIDs due to increased bleeding risk with anticoagulants; -Recommended to continue current medication  Heart Failure (Goal: manage symptoms and prevent exacerbations) -Controlled -Last ejection fraction: 15-20% (Date: 02/04/20) -HF type: Systolic -NYHA Class: III (marked limitation of activity) -AHA HF Stage: C (Heart disease and symptoms present) -Current treatment: Amiodarone 200 mg 1 tablet twice daily - Appropriate, Effective, Safe, Accessible Digoxin 0.125 mg 1 tablet daily - Appropriate, Effective, Safe, Accessible Spironolactone 25 mg 1 tablet daily - Appropriate, Effective, Safe, Accessible Jardiance 10 mg 1 tablet daily - Appropriate, Effective, Safe, Accessible Torsemide 20 mg 2 tablets daily - Appropriate, Effective, Safe, Accessible -Medications previously tried: none  -Current home BP/HR readings: refer to above -Current dietary habits: eats as little sodium as possible; doesn't use salt at home, fresh or frozen; avoids things > 100 mg of sodium per serving; asks about sodium content in restaurants -Current exercise habits: going to the gym regularly now -Educated on Benefits of medications for managing symptoms and prolonging  life Importance of weighing daily; if you gain more than 3 pounds in one day or 5 pounds in one week, call cardiologist. -Counseled on diet and exercise extensively Recommended to continue current medication -Recommended reaching out to cardiology about dose change with torsemide.  OSA (Goal: control symptoms and prevent exacerbations) -Controlled -Current treatment  Breztri 160-9-4.8 mcg/act 2 puffs twice daily - taking once daily-  Appropriate, Effective, Safe, Accessible Albuterol HFA as needed - Appropriate, Effective, Safe, Accessible -Medications previously tried: none  -Gold Grade: Gold 2 (FEV1 50-79%) -Current COPD Classification:  B (high sx, <2 exacerbations/yr) -MMRC/CAT score: n/a -Pulmonary function testing: 03/2020 -Exacerbations requiring treatment in last 6 months: none -Patient reports consistent use of maintenance inhaler -Frequency of rescue inhaler use: uses very infrequently -Counseled on Proper inhaler technique; Benefits of consistent maintenance inhaler use When to use rescue inhaler -Recommended to continue current medication  GERD (Goal: minimize symptoms) -Controlled -Current treatment  Omeprazole 40 mg 1 capsule daily - Appropriate, Effective, Safe, Accessible -Medications previously tried: none  -Counseled on non-pharmacologic management of symptoms such as elevating the head of your bed, avoiding eating 2-3 hours before bed, avoiding triggering foods such as acidic, spicy, or fatty foods, eating smaller meals, and wearing clothes that are loose around the waist.  Health Maintenance -Vaccine gaps:  shingrix, COVID booster, tetanus -Current therapy:  Multivitamin 1 tablet daily Sildenafil 50 mg 1 tablet as needed -Educated on Cost vs benefit of each product must be carefully weighed by individual consumer -Patient is satisfied with current therapy and denies issues -Recommended to continue current medication  Patient Goals/Self-Care  Activities Patient will:  - check glucose a couple times a week, document, and provide at future appointments check blood pressure a couple times a week, document, and provide at future appointments weigh daily, and contact provider if weight gain of > 3 lbs in one day or > 5 lbs in one week  Follow Up Plan: The care management team will reach out to the patient again over the next 7 days.  Patient verbalizes understanding of instructions and care plan provided today and agrees to view in [place]. Active MyChart status and patient understanding of how to access instructions and care plan via MyChart confirmed with patient.    The pharmacy team will reach out to the patient again over the next 7 days.   Viona Gilmore, [facility]

## 2022-05-23 DIAGNOSIS — I5022 Chronic systolic (congestive) heart failure: Secondary | ICD-10-CM | POA: Diagnosis not present

## 2022-05-31 NOTE — Progress Notes (Signed)
Remote ICD transmission.   

## 2022-06-03 ENCOUNTER — Telehealth (HOSPITAL_COMMUNITY): Payer: Self-pay | Admitting: Internal Medicine

## 2022-06-03 DIAGNOSIS — I428 Other cardiomyopathies: Secondary | ICD-10-CM | POA: Diagnosis not present

## 2022-06-03 DIAGNOSIS — I5082 Biventricular heart failure: Secondary | ICD-10-CM | POA: Diagnosis not present

## 2022-06-03 DIAGNOSIS — I493 Ventricular premature depolarization: Secondary | ICD-10-CM | POA: Diagnosis not present

## 2022-06-03 DIAGNOSIS — N1831 Chronic kidney disease, stage 3a: Secondary | ICD-10-CM | POA: Diagnosis not present

## 2022-06-03 DIAGNOSIS — E1122 Type 2 diabetes mellitus with diabetic chronic kidney disease: Secondary | ICD-10-CM | POA: Diagnosis not present

## 2022-06-03 DIAGNOSIS — I071 Rheumatic tricuspid insufficiency: Secondary | ICD-10-CM | POA: Diagnosis not present

## 2022-06-03 NOTE — Telephone Encounter (Signed)
Pt left vm stating he has a dental abscess and wants to know if he is cleared for dental work.   Routed to [employer]

## 2022-06-03 NOTE — Telephone Encounter (Signed)
Pt need surgical clearance for dental work, please advise

## 2022-06-04 NOTE — Telephone Encounter (Signed)
Pt is aware and will have dental office fax form

## 2022-06-06 ENCOUNTER — Telehealth: Payer: Self-pay

## 2022-06-06 ENCOUNTER — Other Ambulatory Visit: Payer: Self-pay | Admitting: *Deleted

## 2022-06-06 ENCOUNTER — Encounter: Payer: Self-pay | Admitting: *Deleted

## 2022-06-06 ENCOUNTER — Other Ambulatory Visit (HOSPITAL_COMMUNITY): Payer: Self-pay

## 2022-06-06 DIAGNOSIS — I5022 Chronic systolic (congestive) heart failure: Secondary | ICD-10-CM

## 2022-06-06 DIAGNOSIS — Z951 Presence of aortocoronary bypass graft: Secondary | ICD-10-CM

## 2022-06-06 DIAGNOSIS — E785 Hyperlipidemia, unspecified: Secondary | ICD-10-CM

## 2022-06-06 DIAGNOSIS — I420 Dilated cardiomyopathy: Secondary | ICD-10-CM

## 2022-06-06 DIAGNOSIS — I480 Paroxysmal atrial fibrillation: Secondary | ICD-10-CM

## 2022-06-06 DIAGNOSIS — Z4502 Encounter for adjustment and management of automatic implantable cardiac defibrillator: Secondary | ICD-10-CM

## 2022-06-06 DIAGNOSIS — N183 Chronic kidney disease, stage 3 unspecified: Secondary | ICD-10-CM

## 2022-06-06 MED ORDER — TORSEMIDE 20 MG PO TABS: 20.0000 mg | ORAL_TABLET | ORAL | 3 refills | Status: DC

## 2022-06-06 MED ORDER — TORSEMIDE 20 MG PO TABS
20.0000 mg | ORAL_TABLET | ORAL | 3 refills | Status: DC
  Filled 2022-06-06: qty 8, 28d supply, fill #0

## 2022-06-06 NOTE — Addendum Note (Signed)
Addended by: Tarus Briski, Sharlot Gowda on: 06/06/2022 04:36 PM   Modules accepted: Orders

## 2022-06-06 NOTE — Addendum Note (Signed)
Addended by: Hendrix Yurkovich M on: 06/06/2022 05:00 PM   Modules accepted: Orders

## 2022-06-06 NOTE — Telephone Encounter (Signed)
As requested see transmission below

## 2022-06-06 NOTE — Telephone Encounter (Signed)
I spoke with the patient and he agreed to send a transmission.

## 2022-06-06 NOTE — Telephone Encounter (Signed)
Transmission received and reviewed. Presenting rhythm VS 84. No episodes or alerts since July 25. Optivol below threshold, thoracic impedance stable. Routing to HF clinic per request.

## 2022-06-06 NOTE — Patient Instructions (Signed)
Visit Information  Thank you for taking time to visit with me today. Please don't hesitate to contact me if I can be of assistance to you before our next scheduled telephone appointment.  Following are the goals we discussed today:  Advised patient to his Primary provider prior to make any changes with his medications Provided education to patient re: regarding HF zones and when to contact  his provider related to his symptoms to avoid entering the hospital Reviewed medications with patient and discussed purpose of all medications Collaborated with Jeni Salles (PCP pharmacy) regarding Pt request regarding the medications Torsemide dosing. Reviewed scheduled/upcoming provider appointments including all pending appointments discussed and verified sufficient transportation Advised patient to discuss Medication issues with his Torsemide with Dr. Haroldine Laws on his upcoming appointment with provider Screening for signs and symptoms of depression related to chronic disease state  Assessed social determinant of health barriers  Please call the care guide team at [phone] if you need to cancel or reschedule your appointment.   Please call the Suicide and Crisis Lifeline: 988 call 1-800-273-TALK (toll free, 24 hour hotline) if you are experiencing a Mental Health or [place] or need someone to talk to.   Patient verbalizes understanding of instructions and care plan provided today and agrees to view in [place]. Active MyChart status and patient understanding of how to access instructions and care plan via MyChart confirmed with patient.     Follow up with provider re: Pt opt to decline [facility] services at this time  Raina Mina, RN Care Management Coordinator [place] Office [phone]

## 2022-06-06 NOTE — Telephone Encounter (Signed)
Pt aware and voiced understanding Repeat labs 8/11//23

## 2022-06-06 NOTE — Patient Outreach (Signed)
  Care Coordination   Initial Visit Note   06/06/2022 Name: Bruce Robertson MRN: [id number] DOB: 30-Jul-1961  Bruce Robertson is a 61 y.o. year old male who sees Koberlein, Steele Berg, MD (Inactive) for primary care. I spoke with  [name] by phone today  What matters to the patients health and wellness today?  Medication issue    Goals Addressed               This Visit's Progress     Request to take extra Torsemide for fluid retention (pt-stated)        Care Coordination Interventions: Advised patient to his Primary provider prior to make any changes with his medications Provided education to patient re: regarding HF zones and when to contact  his provider related to his symptoms to avoid entering the hospital Reviewed medications with patient and discussed purpose of all medications Collaborated with Jeni Salles (PCP pharmacy) regarding Pt request regarding the medications Torsemide dosing. Reviewed scheduled/upcoming provider appointments including all pending appointments discussed and verified sufficient transportation Advised patient to discuss Medication issues with his Torsemide with Dr. Haroldine Laws on his upcoming appointment with provider Screening for signs and symptoms of depression related to chronic disease state  Assessed social determinant of health barriers PT DECLINE ONGOING OFFER FOR FOLLOW UP OR ENTRY INTO A DISEASE MANAGEMENT PROGRAM FOR HF.        SDOH assessments and interventions completed:  Yes  SDOH Interventions Today    Flowsheet Row Most Recent Value  SDOH Interventions   Transportation Interventions Intervention Not Indicated        Care Coordination Interventions Activated:  Yes  Care Coordination Interventions:  Yes, provided   Follow up plan: No further intervention required.   Encounter Outcome:  Pt. Visit Completed     Raina Mina, RN Care Management Coordinator [place] Office [phone]

## 2022-06-06 NOTE — Telephone Encounter (Signed)
Scr up on labs. ICD interrogation reviewed. OptiVol suggests he is volume depleted. Taking 20 mg Torsemide daily. Reduce Torsemide to twice a week M, F. Repeat BMET in 1 week.

## 2022-06-09 DIAGNOSIS — G4731 Primary central sleep apnea: Secondary | ICD-10-CM | POA: Diagnosis not present

## 2022-06-10 DIAGNOSIS — Z87891 Personal history of nicotine dependence: Secondary | ICD-10-CM | POA: Diagnosis not present

## 2022-06-10 DIAGNOSIS — K219 Gastro-esophageal reflux disease without esophagitis: Secondary | ICD-10-CM | POA: Diagnosis not present

## 2022-06-10 DIAGNOSIS — Z95810 Presence of automatic (implantable) cardiac defibrillator: Secondary | ICD-10-CM | POA: Diagnosis not present

## 2022-06-10 DIAGNOSIS — I493 Ventricular premature depolarization: Secondary | ICD-10-CM | POA: Diagnosis not present

## 2022-06-10 DIAGNOSIS — I5020 Unspecified systolic (congestive) heart failure: Secondary | ICD-10-CM | POA: Diagnosis not present

## 2022-06-10 DIAGNOSIS — K2270 Barrett's esophagus without dysplasia: Secondary | ICD-10-CM | POA: Diagnosis not present

## 2022-06-10 DIAGNOSIS — Z452 Encounter for adjustment and management of vascular access device: Secondary | ICD-10-CM | POA: Diagnosis not present

## 2022-06-10 DIAGNOSIS — I480 Paroxysmal atrial fibrillation: Secondary | ICD-10-CM | POA: Diagnosis not present

## 2022-06-10 DIAGNOSIS — Z86010 Personal history of colonic polyps: Secondary | ICD-10-CM | POA: Diagnosis not present

## 2022-06-10 DIAGNOSIS — Z7984 Long term (current) use of oral hypoglycemic drugs: Secondary | ICD-10-CM | POA: Diagnosis not present

## 2022-06-10 DIAGNOSIS — E1122 Type 2 diabetes mellitus with diabetic chronic kidney disease: Secondary | ICD-10-CM | POA: Diagnosis not present

## 2022-06-10 DIAGNOSIS — Z79899 Other long term (current) drug therapy: Secondary | ICD-10-CM | POA: Diagnosis not present

## 2022-06-10 DIAGNOSIS — I5082 Biventricular heart failure: Secondary | ICD-10-CM | POA: Diagnosis not present

## 2022-06-10 DIAGNOSIS — I071 Rheumatic tricuspid insufficiency: Secondary | ICD-10-CM | POA: Diagnosis not present

## 2022-06-10 DIAGNOSIS — E785 Hyperlipidemia, unspecified: Secondary | ICD-10-CM | POA: Diagnosis not present

## 2022-06-10 DIAGNOSIS — I428 Other cardiomyopathies: Secondary | ICD-10-CM | POA: Diagnosis not present

## 2022-06-10 DIAGNOSIS — G4733 Obstructive sleep apnea (adult) (pediatric): Secondary | ICD-10-CM | POA: Diagnosis not present

## 2022-06-10 DIAGNOSIS — N1831 Chronic kidney disease, stage 3a: Secondary | ICD-10-CM | POA: Diagnosis not present

## 2022-06-10 DIAGNOSIS — Z7901 Long term (current) use of anticoagulants: Secondary | ICD-10-CM | POA: Diagnosis not present

## 2022-06-10 DIAGNOSIS — Z951 Presence of aortocoronary bypass graft: Secondary | ICD-10-CM | POA: Diagnosis not present

## 2022-06-10 DIAGNOSIS — I272 Pulmonary hypertension, unspecified: Secondary | ICD-10-CM | POA: Diagnosis not present

## 2022-06-10 DIAGNOSIS — I2510 Atherosclerotic heart disease of native coronary artery without angina pectoris: Secondary | ICD-10-CM | POA: Diagnosis not present

## 2022-06-14 ENCOUNTER — Ambulatory Visit (HOSPITAL_COMMUNITY)
Admission: RE | Admit: 2022-06-14 | Discharge: 2022-06-14 | Disposition: A | Discharge status: Home / Self Care | Payer: Medicare Other | Source: Ambulatory Visit | Attending: Cardiology | Admitting: Cardiology

## 2022-06-14 DIAGNOSIS — I5022 Chronic systolic (congestive) heart failure: Secondary | ICD-10-CM | POA: Insufficient documentation

## 2022-06-14 LAB — BASIC METABOLIC PANEL
Anion gap: 8 (ref 5–15)
BUN: 14 mg/dL (ref 6–20)
CO2: 26 mmol/L (ref 22–32)
Calcium: 9.9 mg/dL (ref 8.9–10.3)
Chloride: 101 mmol/L (ref 98–111)
Creatinine, Ser: 1.86 mg/dL — ABNORMAL HIGH (ref 0.61–1.24)
GFR, Estimated: 41 mL/min — ABNORMAL LOW (ref >60)
Glucose, Bld: 129 mg/dL — ABNORMAL HIGH (ref 70–99)
Potassium: 3.6 mmol/L (ref 3.5–5.1)
Sodium: 135 mmol/L (ref 135–145)

## 2022-06-17 ENCOUNTER — Telehealth: Payer: Self-pay | Admitting: Family Medicine

## 2022-06-17 ENCOUNTER — Other Ambulatory Visit (HOSPITAL_COMMUNITY): Payer: Self-pay | Admitting: Internal Medicine

## 2022-06-17 ENCOUNTER — Telehealth (HOSPITAL_COMMUNITY): Payer: Self-pay | Admitting: *Deleted

## 2022-06-17 ENCOUNTER — Other Ambulatory Visit (HOSPITAL_COMMUNITY): Payer: Self-pay

## 2022-06-17 ENCOUNTER — Other Ambulatory Visit (HOSPITAL_COMMUNITY): Payer: Self-pay | Admitting: *Deleted

## 2022-06-17 DIAGNOSIS — R911 Solitary pulmonary nodule: Secondary | ICD-10-CM | POA: Diagnosis not present

## 2022-06-17 DIAGNOSIS — Z01810 Encounter for preprocedural cardiovascular examination: Secondary | ICD-10-CM | POA: Diagnosis not present

## 2022-06-17 DIAGNOSIS — Z4502 Encounter for adjustment and management of automatic implantable cardiac defibrillator: Secondary | ICD-10-CM

## 2022-06-17 DIAGNOSIS — Z95810 Presence of automatic (implantable) cardiac defibrillator: Secondary | ICD-10-CM | POA: Diagnosis not present

## 2022-06-17 DIAGNOSIS — Z1159 Encounter for screening for other viral diseases: Secondary | ICD-10-CM | POA: Diagnosis not present

## 2022-06-17 DIAGNOSIS — I509 Heart failure, unspecified: Secondary | ICD-10-CM | POA: Diagnosis not present

## 2022-06-17 DIAGNOSIS — Z9889 Other specified postprocedural states: Secondary | ICD-10-CM | POA: Diagnosis not present

## 2022-06-17 DIAGNOSIS — I7789 Other specified disorders of arteries and arterioles: Secondary | ICD-10-CM | POA: Diagnosis not present

## 2022-06-17 DIAGNOSIS — Z951 Presence of aortocoronary bypass graft: Secondary | ICD-10-CM | POA: Diagnosis not present

## 2022-06-17 DIAGNOSIS — I420 Dilated cardiomyopathy: Secondary | ICD-10-CM

## 2022-06-17 DIAGNOSIS — I480 Paroxysmal atrial fibrillation: Secondary | ICD-10-CM

## 2022-06-17 DIAGNOSIS — I517 Cardiomegaly: Secondary | ICD-10-CM | POA: Diagnosis not present

## 2022-06-17 DIAGNOSIS — N183 Chronic kidney disease, stage 3 unspecified: Secondary | ICD-10-CM

## 2022-06-17 DIAGNOSIS — Z01818 Encounter for other preprocedural examination: Secondary | ICD-10-CM | POA: Diagnosis not present

## 2022-06-17 DIAGNOSIS — E785 Hyperlipidemia, unspecified: Secondary | ICD-10-CM

## 2022-06-17 MED ORDER — TORSEMIDE 20 MG PO TABS
20.0000 mg | ORAL_TABLET | ORAL | 3 refills | Status: DC
  Filled 2022-06-17: qty 15, 52d supply, fill #0

## 2022-06-17 NOTE — Telephone Encounter (Signed)
I reviewed his device check on 08/03. His optivol was down significantly and thoracic impedance was elevated suggesting he had no extra fluid build up. Kidney function was worsening so diuretic cut back.   Kidney function now improving with lower dose of water pill. Can take an extra torsemide as needed (only takes twice a week) but would be very cautious to only use if weight gain > 3 lb, significantly worsening shortness of breath of leg edema.

## 2022-06-17 NOTE — Telephone Encounter (Signed)
Pt aware.

## 2022-06-17 NOTE — Telephone Encounter (Signed)
Pt states his mediation torsemide (DEMADEX) 20 MG tablet has been cut back and patient is retaining more fluid, and causes him to have shortness of breath on occasion.Marland Kitchen

## 2022-06-17 NOTE — Telephone Encounter (Signed)
Received a message from Bruce Robertson "Patient left VM saying a medication change was made and it is "not good for him". Requested a call back. There are notes in the chart where he called another office and it states: Pt states his mediation torsemide (DEMADEX) 20 MG tablet has been cut back and patient is retaining more fluid, and causes him to have shortness of breath on occasion. Looks like Ria Comment saw him on 8/3 and made the change."  Routed to [employer]

## 2022-06-17 NOTE — Telephone Encounter (Signed)
Spoke with the patient and repeatedly advised he contact the cardiology office immediately as the Rx was not given by a provider in this office.

## 2022-06-19 ENCOUNTER — Other Ambulatory Visit (HOSPITAL_COMMUNITY): Payer: Self-pay | Admitting: *Deleted

## 2022-06-19 ENCOUNTER — Encounter (HOSPITAL_COMMUNITY): Payer: Self-pay

## 2022-06-19 ENCOUNTER — Telehealth: Payer: Self-pay | Admitting: *Deleted

## 2022-06-19 DIAGNOSIS — Z4502 Encounter for adjustment and management of automatic implantable cardiac defibrillator: Secondary | ICD-10-CM

## 2022-06-19 DIAGNOSIS — Z951 Presence of aortocoronary bypass graft: Secondary | ICD-10-CM

## 2022-06-19 DIAGNOSIS — I480 Paroxysmal atrial fibrillation: Secondary | ICD-10-CM

## 2022-06-19 DIAGNOSIS — N183 Chronic kidney disease, stage 3 unspecified: Secondary | ICD-10-CM

## 2022-06-19 DIAGNOSIS — I420 Dilated cardiomyopathy: Secondary | ICD-10-CM

## 2022-06-19 DIAGNOSIS — E785 Hyperlipidemia, unspecified: Secondary | ICD-10-CM

## 2022-06-19 MED ORDER — TORSEMIDE 20 MG PO TABS: 20.0000 mg | ORAL_TABLET | ORAL | 3 refills | Status: DC

## 2022-06-19 NOTE — Telephone Encounter (Signed)
Received a call from the patient saying that his BIPAP is "blowing too hard." I reviewed his chart in [place] and it showed that he has not used the machine since May. He states this is due to him going in and out of the hospital at [facility]. He is testing in preparation for being placed on a heart transplant list. He was informed that due to no activity on the machine Dr Claiborne Billings has no information showing what his body is requiring, therefore he cannot tell what pressures he is needing so he cannot make any changes. I also asked what type of mask he has. He states he has a full face mask. I told him I will leave a different full face mask for him to try. He assured me that he will pick it up today and try to restart using his BIPAP machine tonight. I stressed to him that it will take a while to get used to it, but he really needs to make a good effort to use the machine. Not treating his OSA appropriately can cause his heart issues to become worse. Patient voiced understanding.

## 2022-06-20 ENCOUNTER — Other Ambulatory Visit (HOSPITAL_COMMUNITY): Payer: Self-pay

## 2022-06-20 DIAGNOSIS — I420 Dilated cardiomyopathy: Secondary | ICD-10-CM

## 2022-06-20 DIAGNOSIS — I480 Paroxysmal atrial fibrillation: Secondary | ICD-10-CM

## 2022-06-20 DIAGNOSIS — Z951 Presence of aortocoronary bypass graft: Secondary | ICD-10-CM

## 2022-06-20 DIAGNOSIS — E785 Hyperlipidemia, unspecified: Secondary | ICD-10-CM

## 2022-06-20 DIAGNOSIS — Z4502 Encounter for adjustment and management of automatic implantable cardiac defibrillator: Secondary | ICD-10-CM

## 2022-06-20 DIAGNOSIS — N183 Chronic kidney disease, stage 3 unspecified: Secondary | ICD-10-CM

## 2022-06-20 MED ORDER — TORSEMIDE 20 MG PO TABS: 20.0000 mg | ORAL_TABLET | Freq: Every day | ORAL | 2 refills | Status: DC | PRN

## 2022-06-23 NOTE — Progress Notes (Signed)
ADVANCED HF CLINIC NOTE   Primary Care: Caren Macadam, MD (Inactive) Primary Cardiologist: Dr Minus Breeding Advanced Heart Failure ([facility], Dr. Posey Pronto) & Dr. Haroldine Laws EP: Dr. Curt [facility]: Dr. Claiborne Billings     HPI: Bruce Robertson is a 61 y.o. 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    Followed by Dr. [name]. Also followed closely at [facility] and see's Dr. Posey Pronto. Per last clinic note, 3/23, he had cardiopulmonary stress testing that was reassuring.Peak VO2 near 16. Chronically NYHA Class II. Has been on good GDMT. Not felt to need advanced therapies at time of last assessment by Dr. Posey Pronto.    Echo at [facility] 3/23, EF < 15%, RV moderately reduced, mild MR, moderate TR. It's not clear if he has ever had a cMRI.    Admitted 6/23 w/ a/c CHF and volume overload. Hospitalization was c/b hypotension, requiring discontinuation of several HF medications. Lactate level was normal and renal function remained stable, . Echo EF < 20%, RV moderately reduced, mild MR and progression of TR to severe, GIIIDD and severe BAE. He was diuresed w/ IV Lasix and transitioned back to PO torsemide. Referred to [facility] clinic.    Seen in [facility] 6/23, NYHA IIIb-IV symptoms. Concern for low output and arranged for RHC.  Admitted from cath lab 6/23 after RHC showed elevated left sided filling pressures and low output. Started on milrinone and diuresed with IV lasix. GDMT titrated. Mexilitene switched to amiodarone for better PVC suppression. Discharged home with milrinone with [facility] follow up arranged, weight 186 lbs.  Seen at [facility] last week and scheduled for transplant work-up.   Today he returns for HF follow up. Remains on milrinone. Has cut back torsemide to PRN. Was takin 2x/week and now taking 52m every other day.  Remains SOB with minimal activity. Mild edema in hands otherwise ok. No CP. + nausea (suspect due to ami)  ICD interrogation: No AF. Brief NSVT Optivol up <2 hr/day Personally reviewed  ReDS 45%  Echo today 06/24/22 EF < 20% G3DD RV severely decreased Personally reviewed    Cardiac Testing   - RHC (6/23):     RA = 7 RV = 48/16 PA = 54/28 (39) PCW = 29 (v = 35) Fick cardiac output/index = 3.4/1.6 PVR = 2.9 WU Ao sat = 95% PA sat = 53%, 53% PAPi = 3.7    CPX ([facility]) 01/2022 - pVO2 15.9 (55%) - slope 34   Past Medical History:  Diagnosis Date   AICD (automatic cardioverter/defibrillator) present    Anxiety 08/20/2016   Barrett's esophagus 2002   Cardiomyopathy    Nonischemic. EF has been about 45%. S/P CABG.;  b.  Echo 4/14: EF 25%, global HK with inf and mid apical AK, restrictive physiology with E/e' > 15 (elevated LV filling pressure), trivial AI/MR, mild to mod LAE, mild RVE, mild reduced RVSF, mild RAE, mod TR, PASP 74 (severe pulmonary HTN)    CHF (congestive heart failure) (HCC)    CHF exacerbation (HCC) 07/31/2016   Colon polyps 2002   Coronary artery disease    Diabetes mellitus without complication (HCC)    Type II   Dyslipidemia    hx (03/20/2017)   GERD (gastroesophageal reflux disease)    History of bleeding peptic ulcer  History of echocardiogram    Echo 10/16:  EF 25-30%, poss non-compaction, diff HK with inf-lat and apical HK, restrictive physio, mild AI, severe LAE, mild RVE with mild reduced RVSF, PASP 42 mmHg   Hyperplastic rectal polyp    Moderate episode of recurrent major depressive disorder ([place]) 08/20/2016   09/14/20 - not current   OSA on CPAP    Mild   Pneumonia 09/2018   Tobacco abuse    Remote    Current Outpatient Medications  Medication Sig Dispense Refill   ACCU-CHEK GUIDE test strip Use as directed-patient needs an appt 100 each 1   Accu-Chek Softclix Lancets lancets Use as directed 100 each 1   albuterol (VENTOLIN HFA) 108 (90  Base) MCG/ACT inhaler Inhale 2 puffs into the lungs every 6 (six) hours as needed for wheezing or shortness of breath. 8 g 0   amiodarone (PACERONE) 200 MG tablet Take 1 tablet (200 mg total) by mouth 2 (two) times daily. 60 tablet 6   atorvastatin (LIPITOR) 40 MG tablet Take 1 tablet (40 mg total) by mouth daily. 30 tablet 6   blood glucose meter kit and supplies KIT Dispense based on patient and insurance preference. Use up to four times daily as directed. 1 each 0   Budeson-Glycopyrrol-Formoterol (BREZTRI AEROSPHERE) 160-9-4.8 MCG/ACT AERO Inhale 2 puffs into the lungs 2 (two) times daily. 32.1 g 3   digoxin (LANOXIN) 0.125 MG tablet Take 1 tablet (0.125 mg total) by mouth daily. 30 tablet 6   ELIQUIS 5 MG TABS tablet Take 1 tablet by mouth twice daily 180 tablet 3   JARDIANCE 10 MG TABS tablet Take 1 tablet by mouth once daily 90 tablet 0   milrinone (PRIMACOR) 20 MG/100 ML SOLN infusion Inject 0.0212 mg/min into the vein continuous. 250 mL 32   omeprazole (PRILOSEC) 40 MG capsule Take 1 capsule (40 mg total) by mouth daily. 30 capsule 6   Spacer/Aero-Holding Chambers (AEROCHAMBER PLUS) inhaler Use as instructed 1 each 0   spironolactone (ALDACTONE) 25 MG tablet Take 25 mg by mouth daily.     torsemide (DEMADEX) 20 MG tablet Take 1 tablet (20 mg total) by mouth daily as needed. 30 tablet 2   No current facility-administered medications for this encounter.   No Known Allergies  Social History   Socioeconomic History   Marital status: Significant Other    Spouse name: Not on file   Number of children: 5   Years of education: Not on file   Highest education level: High school graduate  Occupational History   Occupation: Civil engineer, contracting    Employer: [employer]    Comment: Disability 4 years  Tobacco Use   Smoking status: Former    Packs/day: 0.25    Years: 7.00    Total pack years: 1.75    Types: Cigarettes   Smokeless tobacco: Never   Tobacco comments:    03/20/2017 "quit in  [place] Use   Vaping Use: Former   Substances: [place]: vaped for about a month  Substance and Sexual Activity   Alcohol use: Not Currently    Comment: socially   Drug use: Yes    Frequency: 1.0 times per week    Types: Marijuana   Sexual activity: Yes  Other Topics Concern   Not on file  Social History Narrative   Single   Social Determinants of Health   Financial Resource Strain: Low Risk  (01/29/2022)   Overall Financial Resource Strain (CARDIA)  Difficulty of Paying Living Expenses: Not very hard  Food Insecurity: No Food Insecurity (04/17/2022)   Hunger Vital Sign    Worried About Running Out of Food in the Last Year: Never true    Ran Out of Food in the Last Year: Never true  Transportation Needs: No Transportation Needs (06/06/2022)   PRAPARE - Hydrologist (Medical): No    Lack of Transportation (Non-Medical): No  Physical Activity: Inactive (11/20/2021)   Exercise Vital Sign    Days of Exercise per Week: 0 days    Minutes of Exercise per Session: 0 min  Stress: No Stress Concern Present (11/20/2021)   [place]    Feeling of Stress : Only a little  Social Connections: Moderately Isolated (11/14/2020)   Social Connection and Isolation Panel [NHANES]    Frequency of Communication with Friends and Family: More than three times a week    Frequency of Social Gatherings with Friends and Family: Never    Attends Religious Services: More than 4 times per year    Active Member of [employer] or Organizations: No    Attends Archivist Meetings: Never    Marital Status: Divorced  Human resources officer Violence: Not At Risk (06/06/2022)   Humiliation, Afraid, Rape, and Kick questionnaire    Fear of Current or Ex-Partner: No    Emotionally Abused: No    Physically Abused: No    Sexually Abused: No    Family History  Problem Relation Age of Onset   Hypertension Mother     Osteoarthritis Mother    Heart failure Father 57   High blood pressure Father    Bipolar disorder Daughter    Diabetes Daughter    Cancer Maternal Uncle        uncertain type   Cancer Maternal Grandmother        uncertain type   Diabetes Niece    Lung cancer Other    Asthma Neg Hx    Breast cancer Neg Hx    BP 100/68   Pulse (!) 51   Wt 85.9 kg (189 lb 6.4 oz)   SpO2 100%   BMI 25.69 kg/m   Wt Readings from Last 3 Encounters:  06/24/22 85.9 kg (189 lb 6.4 oz)  05/10/22 87.7 kg (193 lb 6.4 oz)  04/29/22 84.6 kg (186 lb 8.2 oz)   PHYSICAL EXAM: General:  NAD. No resp difficulty HEENT: normal Neck: supple. JVP 6-7 Carotids 2+ bilat; no bruits. No lymphadenopathy or thryomegaly appreciated. Cor: PMI nondisplaced. Regular rate & rhythm. No rubs, gallops or murmurs. Lungs: clear Abdomen: soft, nontender, nondistended. No hepatosplenomegaly. No bruits or masses. Good bowel sounds. Extremities: no cyanosis, clubbing, rash, edema Neuro: alert & orientedx3, cranial nerves grossly intact. moves all 4 extremities w/o difficulty. Affect pleasant   ECG (personally reviewed): Sinus brady 51 QTc 560 No PVCs   ASSESSMENT & PLAN: 1. Chronic Biventricular Heart Failure: - NICM, ? Noncompaction. Has ICD  - followed closely by the [facility] (Dr. Posey Pronto) - Echo 3/23 ([facility]) EF < 15%, RV moderately reduced  - CXP 3/23 ([facility]) pVO2 15.9 (55%0 slope 34 RER 1.37 - RHC (6/23): with elevated left sided filling pressure PCWP 29 and low CO.  - Echo today 06/24/22 EF < 20% G3DD RV severely decreased Personally reviewed - NYHA III-IIIb on milrinone   - Volume up. Increase torsemide to 40 daily - Continue milrinone 0.25 mcg.  - Continue  torsemide 20 mg daily. - Continue Jardiance 10 mg daily  - Continue spiro 25 mg daily. - Continue digoxin 0.125 mg daily. Check level today - Off carvedilol due to low output.  - Off Entresto due to low BP.  - ? 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 Mexiletine  switched to amiodarone for better PVC suppression. PVCs now suppressed > 4 weeks with no improvement in EF - Blood type O pos. - Being followed at [facility] for transplant w/u 8/23 - Labs today.   2. PAF - Remains in NSR on ECG today. - Continue Eliquis, no bleeding issues. - CBC today.    3. H/o LM Coronary Dissection  - Iatrogenic during diagnostic LHC in 2007.  - s/p CABG.  - No chest pain   4. Pulmonary HTN - Echo RVSP 53 mmHg  - suspect combination WHO Groups 2&3 - RHC (6/23): RA 7, PA 54/28 (39), PCWP 29 (v 35), Fick CO/CI 3.4/1.6, PVR 2.9 WU, PAPi 3.7, PA sat 53% - + RV Failure - Sildenafil recently discontinued due to hypotension. Now on milrinone.    5. Severe TR - Likely functional in setting of dilated RA/RV. Moderate to severe on echo today. - Continue diuretics.   6. OSA - Compliant w/ CPAP  - Followed by Dr. Claiborne Billings. Advised calling Dr. Evette Georges office about CPAP machine troubleshooting.   7. Type 2DM - last Hgb A1c 7.0 - Continue Jardiance. - on statin, followed by PCP    8. CKD, Stage IIIa - Baseline SCr ~1.6  - on Jardiance. - BMET today.   9. PVCs/NSVT - Noted on tele during recent admit - mexiletine switched to amiodarone. - QT prolonged. Hold amio for 3 days then  decrease to 200 daily  - Baseline TSH and LFTs ok. - BMET and Mag today.   Glori Bickers, MD  10:18 AM

## 2022-06-24 ENCOUNTER — Encounter (HOSPITAL_COMMUNITY): Payer: Self-pay | Admitting: Internal Medicine

## 2022-06-24 ENCOUNTER — Ambulatory Visit (HOSPITAL_COMMUNITY)
Admission: RE | Admit: 2022-06-24 | Discharge: 2022-06-24 | Disposition: A | Discharge status: Home / Self Care | Payer: Medicare Other | Source: Ambulatory Visit | Attending: Internal Medicine | Admitting: Internal Medicine

## 2022-06-24 ENCOUNTER — Ambulatory Visit (HOSPITAL_COMMUNITY)
Admission: RE | Admit: 2022-06-24 | Discharge: 2022-06-24 | Disposition: A | Discharge status: Home / Self Care | Payer: Medicare Other | Source: Ambulatory Visit | Attending: Family Medicine | Admitting: Family Medicine

## 2022-06-24 VITALS — BP 100/68 | HR 51 | Wt 189.4 lb

## 2022-06-24 DIAGNOSIS — I130 Hypertensive heart and chronic kidney disease with heart failure and stage 1 through stage 4 chronic kidney disease, or unspecified chronic kidney disease: Secondary | ICD-10-CM | POA: Diagnosis not present

## 2022-06-24 DIAGNOSIS — I5022 Chronic systolic (congestive) heart failure: Secondary | ICD-10-CM | POA: Diagnosis not present

## 2022-06-24 DIAGNOSIS — I480 Paroxysmal atrial fibrillation: Secondary | ICD-10-CM | POA: Diagnosis not present

## 2022-06-24 DIAGNOSIS — N1831 Chronic kidney disease, stage 3a: Secondary | ICD-10-CM | POA: Insufficient documentation

## 2022-06-24 DIAGNOSIS — I272 Pulmonary hypertension, unspecified: Secondary | ICD-10-CM | POA: Diagnosis not present

## 2022-06-24 DIAGNOSIS — I5020 Unspecified systolic (congestive) heart failure: Secondary | ICD-10-CM | POA: Diagnosis not present

## 2022-06-24 DIAGNOSIS — Z01818 Encounter for other preprocedural examination: Secondary | ICD-10-CM | POA: Diagnosis not present

## 2022-06-24 DIAGNOSIS — Z86010 Personal history of colonic polyps: Secondary | ICD-10-CM | POA: Diagnosis not present

## 2022-06-24 DIAGNOSIS — I959 Hypotension, unspecified: Secondary | ICD-10-CM | POA: Diagnosis not present

## 2022-06-24 DIAGNOSIS — Z7984 Long term (current) use of oral hypoglycemic drugs: Secondary | ICD-10-CM | POA: Diagnosis not present

## 2022-06-24 DIAGNOSIS — Z7901 Long term (current) use of anticoagulants: Secondary | ICD-10-CM | POA: Diagnosis not present

## 2022-06-24 DIAGNOSIS — I428 Other cardiomyopathies: Secondary | ICD-10-CM | POA: Diagnosis not present

## 2022-06-24 DIAGNOSIS — N183 Chronic kidney disease, stage 3 unspecified: Secondary | ICD-10-CM

## 2022-06-24 DIAGNOSIS — I5082 Biventricular heart failure: Secondary | ICD-10-CM | POA: Insufficient documentation

## 2022-06-24 DIAGNOSIS — I071 Rheumatic tricuspid insufficiency: Secondary | ICD-10-CM | POA: Diagnosis not present

## 2022-06-24 DIAGNOSIS — E1122 Type 2 diabetes mellitus with diabetic chronic kidney disease: Secondary | ICD-10-CM | POA: Diagnosis not present

## 2022-06-24 DIAGNOSIS — Z87891 Personal history of nicotine dependence: Secondary | ICD-10-CM | POA: Diagnosis not present

## 2022-06-24 DIAGNOSIS — Z4502 Encounter for adjustment and management of automatic implantable cardiac defibrillator: Secondary | ICD-10-CM

## 2022-06-24 DIAGNOSIS — E785 Hyperlipidemia, unspecified: Secondary | ICD-10-CM

## 2022-06-24 DIAGNOSIS — Z95810 Presence of automatic (implantable) cardiac defibrillator: Secondary | ICD-10-CM | POA: Diagnosis not present

## 2022-06-24 DIAGNOSIS — K219 Gastro-esophageal reflux disease without esophagitis: Secondary | ICD-10-CM | POA: Diagnosis not present

## 2022-06-24 DIAGNOSIS — Z951 Presence of aortocoronary bypass graft: Secondary | ICD-10-CM

## 2022-06-24 DIAGNOSIS — I420 Dilated cardiomyopathy: Secondary | ICD-10-CM

## 2022-06-24 DIAGNOSIS — K2270 Barrett's esophagus without dysplasia: Secondary | ICD-10-CM | POA: Diagnosis not present

## 2022-06-24 DIAGNOSIS — I493 Ventricular premature depolarization: Secondary | ICD-10-CM | POA: Diagnosis not present

## 2022-06-24 DIAGNOSIS — Z452 Encounter for adjustment and management of vascular access device: Secondary | ICD-10-CM | POA: Diagnosis not present

## 2022-06-24 DIAGNOSIS — I472 Ventricular tachycardia, unspecified: Secondary | ICD-10-CM | POA: Insufficient documentation

## 2022-06-24 DIAGNOSIS — Z79899 Other long term (current) drug therapy: Secondary | ICD-10-CM | POA: Diagnosis not present

## 2022-06-24 DIAGNOSIS — G4733 Obstructive sleep apnea (adult) (pediatric): Secondary | ICD-10-CM | POA: Insufficient documentation

## 2022-06-24 DIAGNOSIS — I454 Nonspecific intraventricular block: Secondary | ICD-10-CM | POA: Insufficient documentation

## 2022-06-24 DIAGNOSIS — I2510 Atherosclerotic heart disease of native coronary artery without angina pectoris: Secondary | ICD-10-CM | POA: Diagnosis not present

## 2022-06-24 LAB — ECHOCARDIOGRAM COMPLETE
Area-P 1/2: 6.27 cm2
P 1/2 time: 382 msec
S' Lateral: 6.2 cm
Single Plane A2C EF: 32.6 %

## 2022-06-24 LAB — BASIC METABOLIC PANEL
Anion gap: 11 (ref 5–15)
BUN: 16 mg/dL (ref 6–20)
CO2: 29 mmol/L (ref 22–32)
Calcium: 9.3 mg/dL (ref 8.9–10.3)
Chloride: 98 mmol/L (ref 98–111)
Creatinine, Ser: 1.68 mg/dL — ABNORMAL HIGH (ref 0.61–1.24)
GFR, Estimated: 47 mL/min — ABNORMAL LOW (ref >60)
Glucose, Bld: 108 mg/dL — ABNORMAL HIGH (ref 70–99)
Potassium: 3.2 mmol/L — ABNORMAL LOW (ref 3.5–5.1)
Sodium: 138 mmol/L (ref 135–145)

## 2022-06-24 LAB — COOXEMETRY PANEL
Carboxyhemoglobin: 1.8 % — ABNORMAL HIGH (ref 0.5–1.5)
Methemoglobin: <0.7 % (ref 0.0–1.5)
O2 Saturation: 56.8 %
Total hemoglobin: 13.9 g/dL (ref 12.0–16.0)

## 2022-06-24 LAB — BRAIN NATRIURETIC PEPTIDE: B Natriuretic Peptide: 573.1 pg/mL — ABNORMAL HIGH (ref 0.0–100.0)

## 2022-06-24 MED ORDER — TORSEMIDE 20 MG PO TABS: 40.0000 mg | ORAL_TABLET | Freq: Every day | ORAL | 3 refills | Status: DC

## 2022-06-24 MED ORDER — AMIODARONE HCL 200 MG PO TABS: 200.0000 mg | ORAL_TABLET | Freq: Every day | ORAL | 6 refills | Status: DC

## 2022-06-24 NOTE — Progress Notes (Signed)
Labs drawn from [place], RN, BSN, [facility] Specialty Coordinator Advanced Heart Failure Clinic

## 2022-06-24 NOTE — Patient Instructions (Signed)
Medication Changes:  HOLD Amiodarone FOR 3 DAYS, then restart at 200 mg Daily  Take Torsemide 40 mg (2 tabs) Daily  Lab Work:  Labs done today, your results will be available in MyChart, we will contact you for abnormal readings.  Testing/Procedures:  none  Referrals:  none  Special Instructions // Education:  Do the following things EVERYDAY: Weigh yourself in the morning before breakfast. Write it down and keep it in a log. Take your medicines as prescribed Eat low salt foods--Limit salt (sodium) to 2000 mg per day.  Stay as active as you can everyday Limit all fluids for the day to less than 2 liters   Follow-Up in: 2 months  At the [facility], you and your health needs are our priority. We have a designated team specialized in the treatment of Heart Failure. This Care Team includes your primary Heart Failure Specialized Cardiologist (physician), Advanced Practice Providers (APPs- Physician Assistants and Nurse Practitioners), and Pharmacist who all work together to provide you with the care you need, when you need it.   You may see any of the following providers on your designated Care Team at your next follow up:  Dr Glori Bickers Dr Haynes Kerns, NP Lyda Jester, [place] [facility] [place], [place] Audry Riles, PharmD   Please be sure to bring in all your medications bottles to every appointment.   Need to Contact us:  If you have any questions or concerns before your next appointment please send [place] a message through [place] or call our office at [phone].    TO LEAVE A MESSAGE FOR THE NURSE SELECT OPTION 2, PLEASE LEAVE A MESSAGE INCLUDING: YOUR NAME DATE OF BIRTH CALL BACK NUMBER REASON FOR CALL**this is important as we prioritize the call backs  YOU WILL RECEIVE A CALL BACK THE SAME DAY AS LONG AS YOU CALL BEFORE 4:00 PM

## 2022-06-24 NOTE — Progress Notes (Signed)
  Echocardiogram 2D Echocardiogram has been performed.  Bruce Robertson 06/24/2022, 8:45 AM

## 2022-06-24 NOTE — Progress Notes (Signed)
ReDS Vest / Clip - 06/24/22 1000       ReDS Vest / Clip   Station Marker D    Ruler Value 32    ReDS Value Range High volume overload    ReDS Actual Value 45

## 2022-06-26 ENCOUNTER — Telehealth (HOSPITAL_COMMUNITY): Payer: Self-pay | Admitting: Cardiology

## 2022-06-26 MED ORDER — POTASSIUM CHLORIDE CRYS ER 20 MEQ PO TBCR: 60.0000 meq | EXTENDED_RELEASE_TABLET | Freq: Once | ORAL | 0 refills | Status: DC

## 2022-06-26 NOTE — Telephone Encounter (Signed)
-----   Message from Jolaine Artist, MD sent at 06/26/2022  8:14 AM EDT ----- Take 60 meq x 1 today

## 2022-06-26 NOTE — Telephone Encounter (Signed)
Patient called.  Patient aware. Script sent

## 2022-06-27 ENCOUNTER — Encounter: Payer: Medicare HMO | Admitting: Family Medicine

## 2022-07-01 DIAGNOSIS — E1122 Type 2 diabetes mellitus with diabetic chronic kidney disease: Secondary | ICD-10-CM | POA: Diagnosis not present

## 2022-07-01 DIAGNOSIS — Z95810 Presence of automatic (implantable) cardiac defibrillator: Secondary | ICD-10-CM | POA: Diagnosis not present

## 2022-07-01 DIAGNOSIS — N1831 Chronic kidney disease, stage 3a: Secondary | ICD-10-CM | POA: Diagnosis not present

## 2022-07-01 DIAGNOSIS — I480 Paroxysmal atrial fibrillation: Secondary | ICD-10-CM | POA: Diagnosis not present

## 2022-07-01 DIAGNOSIS — Z7984 Long term (current) use of oral hypoglycemic drugs: Secondary | ICD-10-CM | POA: Diagnosis not present

## 2022-07-01 DIAGNOSIS — E785 Hyperlipidemia, unspecified: Secondary | ICD-10-CM | POA: Diagnosis not present

## 2022-07-01 DIAGNOSIS — K2270 Barrett's esophagus without dysplasia: Secondary | ICD-10-CM | POA: Diagnosis not present

## 2022-07-01 DIAGNOSIS — Z7901 Long term (current) use of anticoagulants: Secondary | ICD-10-CM | POA: Diagnosis not present

## 2022-07-01 DIAGNOSIS — Z87891 Personal history of nicotine dependence: Secondary | ICD-10-CM | POA: Diagnosis not present

## 2022-07-01 DIAGNOSIS — Z452 Encounter for adjustment and management of vascular access device: Secondary | ICD-10-CM | POA: Diagnosis not present

## 2022-07-01 DIAGNOSIS — I493 Ventricular premature depolarization: Secondary | ICD-10-CM | POA: Diagnosis not present

## 2022-07-01 DIAGNOSIS — I272 Pulmonary hypertension, unspecified: Secondary | ICD-10-CM | POA: Diagnosis not present

## 2022-07-01 DIAGNOSIS — I5020 Unspecified systolic (congestive) heart failure: Secondary | ICD-10-CM | POA: Diagnosis not present

## 2022-07-01 DIAGNOSIS — K219 Gastro-esophageal reflux disease without esophagitis: Secondary | ICD-10-CM | POA: Diagnosis not present

## 2022-07-01 DIAGNOSIS — G4733 Obstructive sleep apnea (adult) (pediatric): Secondary | ICD-10-CM | POA: Diagnosis not present

## 2022-07-01 DIAGNOSIS — Z86010 Personal history of colonic polyps: Secondary | ICD-10-CM | POA: Diagnosis not present

## 2022-07-01 DIAGNOSIS — I2510 Atherosclerotic heart disease of native coronary artery without angina pectoris: Secondary | ICD-10-CM | POA: Diagnosis not present

## 2022-07-01 DIAGNOSIS — I5082 Biventricular heart failure: Secondary | ICD-10-CM | POA: Diagnosis not present

## 2022-07-01 DIAGNOSIS — Z79899 Other long term (current) drug therapy: Secondary | ICD-10-CM | POA: Diagnosis not present

## 2022-07-01 DIAGNOSIS — I428 Other cardiomyopathies: Secondary | ICD-10-CM | POA: Diagnosis not present

## 2022-07-01 DIAGNOSIS — Z951 Presence of aortocoronary bypass graft: Secondary | ICD-10-CM | POA: Diagnosis not present

## 2022-07-01 DIAGNOSIS — I071 Rheumatic tricuspid insufficiency: Secondary | ICD-10-CM | POA: Diagnosis not present

## 2022-07-02 DIAGNOSIS — R0602 Shortness of breath: Secondary | ICD-10-CM | POA: Diagnosis not present

## 2022-07-02 DIAGNOSIS — Z713 Dietary counseling and surveillance: Secondary | ICD-10-CM | POA: Diagnosis not present

## 2022-07-02 DIAGNOSIS — Z1159 Encounter for screening for other viral diseases: Secondary | ICD-10-CM | POA: Diagnosis not present

## 2022-07-02 DIAGNOSIS — Z01810 Encounter for preprocedural cardiovascular examination: Secondary | ICD-10-CM | POA: Diagnosis not present

## 2022-07-02 DIAGNOSIS — Z01818 Encounter for other preprocedural examination: Secondary | ICD-10-CM | POA: Diagnosis not present

## 2022-07-02 DIAGNOSIS — I509 Heart failure, unspecified: Secondary | ICD-10-CM | POA: Diagnosis not present

## 2022-07-02 DIAGNOSIS — Z7682 Awaiting organ transplant status: Secondary | ICD-10-CM | POA: Diagnosis not present

## 2022-07-03 DIAGNOSIS — I509 Heart failure, unspecified: Secondary | ICD-10-CM | POA: Diagnosis not present

## 2022-07-03 DIAGNOSIS — I428 Other cardiomyopathies: Secondary | ICD-10-CM | POA: Diagnosis not present

## 2022-07-03 DIAGNOSIS — Z01810 Encounter for preprocedural cardiovascular examination: Secondary | ICD-10-CM | POA: Diagnosis not present

## 2022-07-03 DIAGNOSIS — Z1159 Encounter for screening for other viral diseases: Secondary | ICD-10-CM | POA: Diagnosis not present

## 2022-07-03 DIAGNOSIS — Z01818 Encounter for other preprocedural examination: Secondary | ICD-10-CM | POA: Diagnosis not present

## 2022-07-04 DIAGNOSIS — N183 Chronic kidney disease, stage 3 unspecified: Secondary | ICD-10-CM | POA: Diagnosis not present

## 2022-07-04 DIAGNOSIS — Z95810 Presence of automatic (implantable) cardiac defibrillator: Secondary | ICD-10-CM | POA: Diagnosis not present

## 2022-07-04 DIAGNOSIS — I5022 Chronic systolic (congestive) heart failure: Secondary | ICD-10-CM | POA: Diagnosis not present

## 2022-07-04 DIAGNOSIS — Z7682 Awaiting organ transplant status: Secondary | ICD-10-CM | POA: Diagnosis not present

## 2022-07-04 DIAGNOSIS — Z01810 Encounter for preprocedural cardiovascular examination: Secondary | ICD-10-CM | POA: Diagnosis not present

## 2022-07-04 DIAGNOSIS — I4891 Unspecified atrial fibrillation: Secondary | ICD-10-CM | POA: Diagnosis not present

## 2022-07-04 DIAGNOSIS — E1122 Type 2 diabetes mellitus with diabetic chronic kidney disease: Secondary | ICD-10-CM | POA: Diagnosis not present

## 2022-07-04 DIAGNOSIS — I428 Other cardiomyopathies: Secondary | ICD-10-CM | POA: Diagnosis not present

## 2022-07-04 DIAGNOSIS — Z951 Presence of aortocoronary bypass graft: Secondary | ICD-10-CM | POA: Diagnosis not present

## 2022-07-08 DIAGNOSIS — E785 Hyperlipidemia, unspecified: Secondary | ICD-10-CM | POA: Diagnosis not present

## 2022-07-08 DIAGNOSIS — Z7901 Long term (current) use of anticoagulants: Secondary | ICD-10-CM | POA: Diagnosis not present

## 2022-07-08 DIAGNOSIS — I272 Pulmonary hypertension, unspecified: Secondary | ICD-10-CM | POA: Diagnosis not present

## 2022-07-08 DIAGNOSIS — K2270 Barrett's esophagus without dysplasia: Secondary | ICD-10-CM | POA: Diagnosis not present

## 2022-07-08 DIAGNOSIS — I2510 Atherosclerotic heart disease of native coronary artery without angina pectoris: Secondary | ICD-10-CM | POA: Diagnosis not present

## 2022-07-08 DIAGNOSIS — Z452 Encounter for adjustment and management of vascular access device: Secondary | ICD-10-CM | POA: Diagnosis not present

## 2022-07-08 DIAGNOSIS — I428 Other cardiomyopathies: Secondary | ICD-10-CM | POA: Diagnosis not present

## 2022-07-08 DIAGNOSIS — Z87891 Personal history of nicotine dependence: Secondary | ICD-10-CM | POA: Diagnosis not present

## 2022-07-08 DIAGNOSIS — Z95810 Presence of automatic (implantable) cardiac defibrillator: Secondary | ICD-10-CM | POA: Diagnosis not present

## 2022-07-08 DIAGNOSIS — I5020 Unspecified systolic (congestive) heart failure: Secondary | ICD-10-CM | POA: Diagnosis not present

## 2022-07-08 DIAGNOSIS — I493 Ventricular premature depolarization: Secondary | ICD-10-CM | POA: Diagnosis not present

## 2022-07-08 DIAGNOSIS — I5082 Biventricular heart failure: Secondary | ICD-10-CM | POA: Diagnosis not present

## 2022-07-08 DIAGNOSIS — Z86010 Personal history of colonic polyps: Secondary | ICD-10-CM | POA: Diagnosis not present

## 2022-07-08 DIAGNOSIS — Z951 Presence of aortocoronary bypass graft: Secondary | ICD-10-CM | POA: Diagnosis not present

## 2022-07-08 DIAGNOSIS — E1122 Type 2 diabetes mellitus with diabetic chronic kidney disease: Secondary | ICD-10-CM | POA: Diagnosis not present

## 2022-07-08 DIAGNOSIS — Z7984 Long term (current) use of oral hypoglycemic drugs: Secondary | ICD-10-CM | POA: Diagnosis not present

## 2022-07-08 DIAGNOSIS — I480 Paroxysmal atrial fibrillation: Secondary | ICD-10-CM | POA: Diagnosis not present

## 2022-07-08 DIAGNOSIS — K219 Gastro-esophageal reflux disease without esophagitis: Secondary | ICD-10-CM | POA: Diagnosis not present

## 2022-07-08 DIAGNOSIS — N1831 Chronic kidney disease, stage 3a: Secondary | ICD-10-CM | POA: Diagnosis not present

## 2022-07-08 DIAGNOSIS — G4733 Obstructive sleep apnea (adult) (pediatric): Secondary | ICD-10-CM | POA: Diagnosis not present

## 2022-07-08 DIAGNOSIS — Z79899 Other long term (current) drug therapy: Secondary | ICD-10-CM | POA: Diagnosis not present

## 2022-07-08 DIAGNOSIS — I071 Rheumatic tricuspid insufficiency: Secondary | ICD-10-CM | POA: Diagnosis not present

## 2022-07-10 DIAGNOSIS — G4731 Primary central sleep apnea: Secondary | ICD-10-CM | POA: Diagnosis not present

## 2022-07-11 ENCOUNTER — Other Ambulatory Visit (HOSPITAL_COMMUNITY): Payer: Self-pay | Admitting: Internal Medicine

## 2022-07-12 DIAGNOSIS — Z008 Encounter for other general examination: Secondary | ICD-10-CM | POA: Diagnosis not present

## 2022-07-12 DIAGNOSIS — Z7682 Awaiting organ transplant status: Secondary | ICD-10-CM | POA: Diagnosis not present

## 2022-07-15 DIAGNOSIS — I2510 Atherosclerotic heart disease of native coronary artery without angina pectoris: Secondary | ICD-10-CM | POA: Diagnosis not present

## 2022-07-15 DIAGNOSIS — I5020 Unspecified systolic (congestive) heart failure: Secondary | ICD-10-CM | POA: Diagnosis not present

## 2022-07-15 DIAGNOSIS — I428 Other cardiomyopathies: Secondary | ICD-10-CM | POA: Diagnosis not present

## 2022-07-15 DIAGNOSIS — Z86010 Personal history of colonic polyps: Secondary | ICD-10-CM | POA: Diagnosis not present

## 2022-07-15 DIAGNOSIS — Z95810 Presence of automatic (implantable) cardiac defibrillator: Secondary | ICD-10-CM | POA: Diagnosis not present

## 2022-07-15 DIAGNOSIS — E1122 Type 2 diabetes mellitus with diabetic chronic kidney disease: Secondary | ICD-10-CM | POA: Diagnosis not present

## 2022-07-15 DIAGNOSIS — Z87891 Personal history of nicotine dependence: Secondary | ICD-10-CM | POA: Diagnosis not present

## 2022-07-15 DIAGNOSIS — G4733 Obstructive sleep apnea (adult) (pediatric): Secondary | ICD-10-CM | POA: Diagnosis not present

## 2022-07-15 DIAGNOSIS — Z79899 Other long term (current) drug therapy: Secondary | ICD-10-CM | POA: Diagnosis not present

## 2022-07-15 DIAGNOSIS — Z452 Encounter for adjustment and management of vascular access device: Secondary | ICD-10-CM | POA: Diagnosis not present

## 2022-07-15 DIAGNOSIS — K2270 Barrett's esophagus without dysplasia: Secondary | ICD-10-CM | POA: Diagnosis not present

## 2022-07-15 DIAGNOSIS — I480 Paroxysmal atrial fibrillation: Secondary | ICD-10-CM | POA: Diagnosis not present

## 2022-07-15 DIAGNOSIS — E785 Hyperlipidemia, unspecified: Secondary | ICD-10-CM | POA: Diagnosis not present

## 2022-07-15 DIAGNOSIS — I071 Rheumatic tricuspid insufficiency: Secondary | ICD-10-CM | POA: Diagnosis not present

## 2022-07-15 DIAGNOSIS — K219 Gastro-esophageal reflux disease without esophagitis: Secondary | ICD-10-CM | POA: Diagnosis not present

## 2022-07-15 DIAGNOSIS — I5082 Biventricular heart failure: Secondary | ICD-10-CM | POA: Diagnosis not present

## 2022-07-15 DIAGNOSIS — Z7984 Long term (current) use of oral hypoglycemic drugs: Secondary | ICD-10-CM | POA: Diagnosis not present

## 2022-07-15 DIAGNOSIS — I272 Pulmonary hypertension, unspecified: Secondary | ICD-10-CM | POA: Diagnosis not present

## 2022-07-15 DIAGNOSIS — N1831 Chronic kidney disease, stage 3a: Secondary | ICD-10-CM | POA: Diagnosis not present

## 2022-07-15 DIAGNOSIS — Z7901 Long term (current) use of anticoagulants: Secondary | ICD-10-CM | POA: Diagnosis not present

## 2022-07-15 DIAGNOSIS — I493 Ventricular premature depolarization: Secondary | ICD-10-CM | POA: Diagnosis not present

## 2022-07-15 DIAGNOSIS — Z951 Presence of aortocoronary bypass graft: Secondary | ICD-10-CM | POA: Diagnosis not present

## 2022-07-22 DIAGNOSIS — I5020 Unspecified systolic (congestive) heart failure: Secondary | ICD-10-CM | POA: Diagnosis not present

## 2022-07-22 DIAGNOSIS — I428 Other cardiomyopathies: Secondary | ICD-10-CM | POA: Diagnosis not present

## 2022-07-22 DIAGNOSIS — N1831 Chronic kidney disease, stage 3a: Secondary | ICD-10-CM | POA: Diagnosis not present

## 2022-07-22 DIAGNOSIS — G4733 Obstructive sleep apnea (adult) (pediatric): Secondary | ICD-10-CM | POA: Diagnosis not present

## 2022-07-22 DIAGNOSIS — Z79899 Other long term (current) drug therapy: Secondary | ICD-10-CM | POA: Diagnosis not present

## 2022-07-22 DIAGNOSIS — K219 Gastro-esophageal reflux disease without esophagitis: Secondary | ICD-10-CM | POA: Diagnosis not present

## 2022-07-22 DIAGNOSIS — Z7901 Long term (current) use of anticoagulants: Secondary | ICD-10-CM | POA: Diagnosis not present

## 2022-07-22 DIAGNOSIS — I071 Rheumatic tricuspid insufficiency: Secondary | ICD-10-CM | POA: Diagnosis not present

## 2022-07-22 DIAGNOSIS — I480 Paroxysmal atrial fibrillation: Secondary | ICD-10-CM | POA: Diagnosis not present

## 2022-07-22 DIAGNOSIS — I493 Ventricular premature depolarization: Secondary | ICD-10-CM | POA: Diagnosis not present

## 2022-07-22 DIAGNOSIS — I2510 Atherosclerotic heart disease of native coronary artery without angina pectoris: Secondary | ICD-10-CM | POA: Diagnosis not present

## 2022-07-22 DIAGNOSIS — I5082 Biventricular heart failure: Secondary | ICD-10-CM | POA: Diagnosis not present

## 2022-07-22 DIAGNOSIS — Z87891 Personal history of nicotine dependence: Secondary | ICD-10-CM | POA: Diagnosis not present

## 2022-07-22 DIAGNOSIS — E785 Hyperlipidemia, unspecified: Secondary | ICD-10-CM | POA: Diagnosis not present

## 2022-07-22 DIAGNOSIS — Z86010 Personal history of colonic polyps: Secondary | ICD-10-CM | POA: Diagnosis not present

## 2022-07-22 DIAGNOSIS — I272 Pulmonary hypertension, unspecified: Secondary | ICD-10-CM | POA: Diagnosis not present

## 2022-07-22 DIAGNOSIS — K2270 Barrett's esophagus without dysplasia: Secondary | ICD-10-CM | POA: Diagnosis not present

## 2022-07-22 DIAGNOSIS — Z7984 Long term (current) use of oral hypoglycemic drugs: Secondary | ICD-10-CM | POA: Diagnosis not present

## 2022-07-22 DIAGNOSIS — Z95810 Presence of automatic (implantable) cardiac defibrillator: Secondary | ICD-10-CM | POA: Diagnosis not present

## 2022-07-22 DIAGNOSIS — Z951 Presence of aortocoronary bypass graft: Secondary | ICD-10-CM | POA: Diagnosis not present

## 2022-07-22 DIAGNOSIS — Z452 Encounter for adjustment and management of vascular access device: Secondary | ICD-10-CM | POA: Diagnosis not present

## 2022-07-22 DIAGNOSIS — E1122 Type 2 diabetes mellitus with diabetic chronic kidney disease: Secondary | ICD-10-CM | POA: Diagnosis not present

## 2022-07-29 DIAGNOSIS — Z7984 Long term (current) use of oral hypoglycemic drugs: Secondary | ICD-10-CM | POA: Diagnosis not present

## 2022-07-29 DIAGNOSIS — Z7901 Long term (current) use of anticoagulants: Secondary | ICD-10-CM | POA: Diagnosis not present

## 2022-07-29 DIAGNOSIS — I5020 Unspecified systolic (congestive) heart failure: Secondary | ICD-10-CM | POA: Diagnosis not present

## 2022-07-29 DIAGNOSIS — I493 Ventricular premature depolarization: Secondary | ICD-10-CM | POA: Diagnosis not present

## 2022-07-29 DIAGNOSIS — Z86010 Personal history of colonic polyps: Secondary | ICD-10-CM | POA: Diagnosis not present

## 2022-07-29 DIAGNOSIS — G4733 Obstructive sleep apnea (adult) (pediatric): Secondary | ICD-10-CM | POA: Diagnosis not present

## 2022-07-29 DIAGNOSIS — I428 Other cardiomyopathies: Secondary | ICD-10-CM | POA: Diagnosis not present

## 2022-07-29 DIAGNOSIS — E1122 Type 2 diabetes mellitus with diabetic chronic kidney disease: Secondary | ICD-10-CM | POA: Diagnosis not present

## 2022-07-29 DIAGNOSIS — I2510 Atherosclerotic heart disease of native coronary artery without angina pectoris: Secondary | ICD-10-CM | POA: Diagnosis not present

## 2022-07-29 DIAGNOSIS — I071 Rheumatic tricuspid insufficiency: Secondary | ICD-10-CM | POA: Diagnosis not present

## 2022-07-29 DIAGNOSIS — Z452 Encounter for adjustment and management of vascular access device: Secondary | ICD-10-CM | POA: Diagnosis not present

## 2022-07-29 DIAGNOSIS — K219 Gastro-esophageal reflux disease without esophagitis: Secondary | ICD-10-CM | POA: Diagnosis not present

## 2022-07-29 DIAGNOSIS — I480 Paroxysmal atrial fibrillation: Secondary | ICD-10-CM | POA: Diagnosis not present

## 2022-07-29 DIAGNOSIS — Z87891 Personal history of nicotine dependence: Secondary | ICD-10-CM | POA: Diagnosis not present

## 2022-07-29 DIAGNOSIS — Z951 Presence of aortocoronary bypass graft: Secondary | ICD-10-CM | POA: Diagnosis not present

## 2022-07-29 DIAGNOSIS — Z79899 Other long term (current) drug therapy: Secondary | ICD-10-CM | POA: Diagnosis not present

## 2022-07-29 DIAGNOSIS — I5082 Biventricular heart failure: Secondary | ICD-10-CM | POA: Diagnosis not present

## 2022-07-29 DIAGNOSIS — K2270 Barrett's esophagus without dysplasia: Secondary | ICD-10-CM | POA: Diagnosis not present

## 2022-07-29 DIAGNOSIS — E785 Hyperlipidemia, unspecified: Secondary | ICD-10-CM | POA: Diagnosis not present

## 2022-07-29 DIAGNOSIS — I272 Pulmonary hypertension, unspecified: Secondary | ICD-10-CM | POA: Diagnosis not present

## 2022-07-29 DIAGNOSIS — N1831 Chronic kidney disease, stage 3a: Secondary | ICD-10-CM | POA: Diagnosis not present

## 2022-07-29 DIAGNOSIS — Z95810 Presence of automatic (implantable) cardiac defibrillator: Secondary | ICD-10-CM | POA: Diagnosis not present

## 2022-07-31 ENCOUNTER — Telehealth: Payer: Medicare HMO

## 2022-08-02 ENCOUNTER — Other Ambulatory Visit (HOSPITAL_COMMUNITY): Payer: Self-pay | Admitting: Internal Medicine

## 2022-08-04 HISTORY — PX: HEART TRANSPLANT: SHX268

## 2022-08-05 ENCOUNTER — Other Ambulatory Visit (HOSPITAL_COMMUNITY): Payer: Self-pay | Admitting: Internal Medicine

## 2022-08-05 DIAGNOSIS — Z7984 Long term (current) use of oral hypoglycemic drugs: Secondary | ICD-10-CM | POA: Diagnosis not present

## 2022-08-05 DIAGNOSIS — Z87891 Personal history of nicotine dependence: Secondary | ICD-10-CM | POA: Diagnosis not present

## 2022-08-05 DIAGNOSIS — I493 Ventricular premature depolarization: Secondary | ICD-10-CM | POA: Diagnosis not present

## 2022-08-05 DIAGNOSIS — G4733 Obstructive sleep apnea (adult) (pediatric): Secondary | ICD-10-CM | POA: Diagnosis not present

## 2022-08-05 DIAGNOSIS — I2510 Atherosclerotic heart disease of native coronary artery without angina pectoris: Secondary | ICD-10-CM | POA: Diagnosis not present

## 2022-08-05 DIAGNOSIS — Z452 Encounter for adjustment and management of vascular access device: Secondary | ICD-10-CM | POA: Diagnosis not present

## 2022-08-05 DIAGNOSIS — I272 Pulmonary hypertension, unspecified: Secondary | ICD-10-CM | POA: Diagnosis not present

## 2022-08-05 DIAGNOSIS — Z86010 Personal history of colonic polyps: Secondary | ICD-10-CM | POA: Diagnosis not present

## 2022-08-05 DIAGNOSIS — Z951 Presence of aortocoronary bypass graft: Secondary | ICD-10-CM | POA: Diagnosis not present

## 2022-08-05 DIAGNOSIS — I5082 Biventricular heart failure: Secondary | ICD-10-CM | POA: Diagnosis not present

## 2022-08-05 DIAGNOSIS — Z95810 Presence of automatic (implantable) cardiac defibrillator: Secondary | ICD-10-CM | POA: Diagnosis not present

## 2022-08-05 DIAGNOSIS — K219 Gastro-esophageal reflux disease without esophagitis: Secondary | ICD-10-CM | POA: Diagnosis not present

## 2022-08-05 DIAGNOSIS — K2270 Barrett's esophagus without dysplasia: Secondary | ICD-10-CM | POA: Diagnosis not present

## 2022-08-05 DIAGNOSIS — N1831 Chronic kidney disease, stage 3a: Secondary | ICD-10-CM | POA: Diagnosis not present

## 2022-08-05 DIAGNOSIS — I5020 Unspecified systolic (congestive) heart failure: Secondary | ICD-10-CM | POA: Diagnosis not present

## 2022-08-05 DIAGNOSIS — I480 Paroxysmal atrial fibrillation: Secondary | ICD-10-CM | POA: Diagnosis not present

## 2022-08-05 DIAGNOSIS — E1122 Type 2 diabetes mellitus with diabetic chronic kidney disease: Secondary | ICD-10-CM | POA: Diagnosis not present

## 2022-08-05 DIAGNOSIS — Z79899 Other long term (current) drug therapy: Secondary | ICD-10-CM | POA: Diagnosis not present

## 2022-08-05 DIAGNOSIS — I071 Rheumatic tricuspid insufficiency: Secondary | ICD-10-CM | POA: Diagnosis not present

## 2022-08-05 DIAGNOSIS — E785 Hyperlipidemia, unspecified: Secondary | ICD-10-CM | POA: Diagnosis not present

## 2022-08-05 DIAGNOSIS — I428 Other cardiomyopathies: Secondary | ICD-10-CM | POA: Diagnosis not present

## 2022-08-05 DIAGNOSIS — Z7901 Long term (current) use of anticoagulants: Secondary | ICD-10-CM | POA: Diagnosis not present

## 2022-08-09 DIAGNOSIS — G4731 Primary central sleep apnea: Secondary | ICD-10-CM | POA: Diagnosis not present

## 2022-08-12 ENCOUNTER — Other Ambulatory Visit: Payer: Self-pay | Admitting: *Deleted

## 2022-08-12 DIAGNOSIS — I5020 Unspecified systolic (congestive) heart failure: Secondary | ICD-10-CM | POA: Diagnosis not present

## 2022-08-12 DIAGNOSIS — E785 Hyperlipidemia, unspecified: Secondary | ICD-10-CM | POA: Diagnosis not present

## 2022-08-12 DIAGNOSIS — N1831 Chronic kidney disease, stage 3a: Secondary | ICD-10-CM | POA: Diagnosis not present

## 2022-08-12 DIAGNOSIS — Z951 Presence of aortocoronary bypass graft: Secondary | ICD-10-CM | POA: Diagnosis not present

## 2022-08-12 DIAGNOSIS — Z452 Encounter for adjustment and management of vascular access device: Secondary | ICD-10-CM | POA: Diagnosis not present

## 2022-08-12 DIAGNOSIS — Z7984 Long term (current) use of oral hypoglycemic drugs: Secondary | ICD-10-CM | POA: Diagnosis not present

## 2022-08-12 DIAGNOSIS — Z7901 Long term (current) use of anticoagulants: Secondary | ICD-10-CM | POA: Diagnosis not present

## 2022-08-12 DIAGNOSIS — K219 Gastro-esophageal reflux disease without esophagitis: Secondary | ICD-10-CM | POA: Diagnosis not present

## 2022-08-12 DIAGNOSIS — I428 Other cardiomyopathies: Secondary | ICD-10-CM | POA: Diagnosis not present

## 2022-08-12 DIAGNOSIS — I5023 Acute on chronic systolic (congestive) heart failure: Secondary | ICD-10-CM | POA: Diagnosis not present

## 2022-08-12 DIAGNOSIS — Z95810 Presence of automatic (implantable) cardiac defibrillator: Secondary | ICD-10-CM | POA: Diagnosis not present

## 2022-08-12 DIAGNOSIS — I2510 Atherosclerotic heart disease of native coronary artery without angina pectoris: Secondary | ICD-10-CM | POA: Diagnosis not present

## 2022-08-12 DIAGNOSIS — K2270 Barrett's esophagus without dysplasia: Secondary | ICD-10-CM | POA: Diagnosis not present

## 2022-08-12 DIAGNOSIS — Z87891 Personal history of nicotine dependence: Secondary | ICD-10-CM | POA: Diagnosis not present

## 2022-08-12 DIAGNOSIS — E1122 Type 2 diabetes mellitus with diabetic chronic kidney disease: Secondary | ICD-10-CM | POA: Diagnosis not present

## 2022-08-12 DIAGNOSIS — I272 Pulmonary hypertension, unspecified: Secondary | ICD-10-CM | POA: Diagnosis not present

## 2022-08-12 DIAGNOSIS — I071 Rheumatic tricuspid insufficiency: Secondary | ICD-10-CM | POA: Diagnosis not present

## 2022-08-12 DIAGNOSIS — I493 Ventricular premature depolarization: Secondary | ICD-10-CM | POA: Diagnosis not present

## 2022-08-12 DIAGNOSIS — G4733 Obstructive sleep apnea (adult) (pediatric): Secondary | ICD-10-CM | POA: Diagnosis not present

## 2022-08-12 DIAGNOSIS — I5082 Biventricular heart failure: Secondary | ICD-10-CM | POA: Diagnosis not present

## 2022-08-12 DIAGNOSIS — I480 Paroxysmal atrial fibrillation: Secondary | ICD-10-CM | POA: Diagnosis not present

## 2022-08-12 DIAGNOSIS — Z86010 Personal history of colonic polyps: Secondary | ICD-10-CM | POA: Diagnosis not present

## 2022-08-12 DIAGNOSIS — Z79899 Other long term (current) drug therapy: Secondary | ICD-10-CM | POA: Diagnosis not present

## 2022-08-12 MED ORDER — JARDIANCE 10 MG PO TABS: 10.0000 mg | ORAL_TABLET | Freq: Every day | ORAL | 0 refills | Status: DC

## 2022-08-12 MED ORDER — ACCU-CHEK GUIDE VI STRP: ORAL_STRIP | 0 refills | Status: AC

## 2022-08-12 NOTE — Telephone Encounter (Signed)
Ok to fill but he needs a TOC appt with me

## 2022-08-13 ENCOUNTER — Other Ambulatory Visit: Payer: Self-pay | Admitting: *Deleted

## 2022-08-13 MED ORDER — ACCU-CHEK SOFTCLIX LANCETS MISC: 0 refills | Status: AC

## 2022-08-13 NOTE — Telephone Encounter (Signed)
Rx done. 

## 2022-08-15 ENCOUNTER — Ambulatory Visit (INDEPENDENT_AMBULATORY_CARE_PROVIDER_SITE_OTHER): Payer: Medicare Other

## 2022-08-15 ENCOUNTER — Telehealth: Payer: Self-pay | Admitting: Pharmacist

## 2022-08-15 DIAGNOSIS — I420 Dilated cardiomyopathy: Secondary | ICD-10-CM

## 2022-08-15 LAB — CUP PACEART REMOTE DEVICE CHECK
Battery Remaining Longevity: 64 mo
Battery Voltage: 2.99 V
Brady Statistic RV Percent Paced: 0.02 %
Date Time Interrogation Session: 20231012001704
HighPow Impedance: 69 Ohm
Implantable Lead Implant Date: 20180517
Implantable Lead Location: 753860
Implantable Pulse Generator Implant Date: 20180517
Lead Channel Impedance Value: 361 Ohm
Lead Channel Impedance Value: 399 Ohm
Lead Channel Pacing Threshold Amplitude: 0.625 V
Lead Channel Pacing Threshold Pulse Width: 0.4 ms
Lead Channel Sensing Intrinsic Amplitude: 10.375 mV
Lead Channel Sensing Intrinsic Amplitude: 10.375 mV
Lead Channel Setting Pacing Amplitude: 2.5 V
Lead Channel Setting Pacing Pulse Width: 0.4 ms
Lead Channel Setting Sensing Sensitivity: 0.3 mV

## 2022-08-15 NOTE — Progress Notes (Signed)
Chronic Care Management Pharmacy Note  08/16/2022 Name:  Bruce Robertson MRN:  [id number] DOB:  01-Aug-1961  Summary: A1c not ideally at goal < 7% LDL not ideally at goal < 70 Pt is needing refills  Recommendations/Changes made from today's visit: -Recommended gastroenterology referral -Recommended repeat A1c -Requested refills for albuterol, test strips and lancets  Plan: Scheduled TOC visit with new PCP in 2 weeks BP and HF assessment in 2 months Follow up in 4 months  Subjective: Bruce Robertson is an 61 y.o. year old male who is a primary patient of Koberlein, Steele Berg, MD (Inactive).  The CCM team was consulted for assistance with disease management and care coordination needs.    Engaged with patient by telephone for follow up visit in response to provider referral for pharmacy case management and/or care coordination services.   Consent to Services:  The patient was given information about Chronic Care Management services, agreed to services, and gave verbal consent prior to initiation of services.  Please see initial visit note for detailed documentation.   Patient Care Team: Caren Macadam, MD (Inactive) as PCP - General (Family Medicine) Constance Haw, MD as PCP - Electrophysiology (Cardiology) Minus Breeding, MD as PCP - [facility] (Cardiology) Minus Breeding, MD as PCP - Cardiology (Cardiology) Constance Haw, MD as Consulting Physician (Cardiology) Milus Banister, MD as Attending Physician (Gastroenterology) Michael Boston, MD as Consulting Physician (General Surgery) Riccardo Dubin, MD as Referring Physician (Cardiology) Viona Gilmore, [facility] as Pharmacist (Pharmacist) Haroldine Laws Shaune Pascal, MD as Consulting Physician (Cardiology)  Recent office visits: 01/04/2022 Micheline Rough MD - Patient was seen for Mass overlapping multiple quadrants of right breast and additional issues. No medication changes. No follow up noted.    Recent consult visits: 07/04/22 Patient presented to [place] for cardiac catheterization.  07/03/22 Holley Raring, MD (cardiology): Patient presented for initial consult for evaluation for heart transplant.  06/24/22 Glori Bickers, MD (cardiology): Patient presented for CHF follow up. Held amiodarone x 3 days then restart at 200 mg daily. Take torsemide 40 mg (2 tablets) daily. Follow up in 2 months.  05/23/22 Lattie Haw, MD (cardiology): Patient presented for CHF follow up. Plan for transplant evaluation.   05/10/22 Allena Katz, [place] (cardiology): Patient presented for CHF follow up. Follow up in 4-6 weeks.  04/25/2022 Glori Bickers MD (cardiovascular) - Procedure - Right Heart Cath.  04/24/22 Lyda Jester, PA-C (cardiology): Patient presented for CHF follow up. Follow up in 1 week.  04/05/2022 Minus Breeding MD (cardiology) - Patient was seen for Chronic combined systolic and diastolic heart failure and additional issues. Decreased Carvedilol to 6.25 mg twice daily. Discontinued Metoprolol. Follow up in 6 months.   03/12/2022 [name] MD(cardiology) - Patient was seen for Chronic systolic CHF (congestive heart failure) and an additional issue. Started Metoprolol 50 mg daily. Discontinued Carvedilol. Follow up in 6 months.   02/27/2022 Kara Mead (pulmonary) - Patient was seen for Obstructive sleep apnea. No additional chart notes.    02/14/2022 Roma Kayser RN [facility]) - Patient was seen for Cardiomyopathy, dilated, nonischemic and an additional issue. No medication changes. No follow up noted.   01/28/2022 Kara Mead (pulmonary) - Patient was seen for Obstructive sleep apnea. Prescribed prednisone taper. Recommended Zyrtec for pollen. Follow up in 3 months.  01/10/2022 Lattie Haw [facility]) - Patient was seen for Chronic systolic CHF. No medications changes. Follow up in 6 months.    12/26/2021 Lenice Llamas MD (pulmonary) -  Patient  was seen for OSA treated with BiPAP and additional issues. No medication changes. Follow up in 2 weeks.    11/29/2021 Tommye Standard PA-C (cardiology) - Patient was seen for Paroxysmal atrial fibrillation and additional issues.  Decreased Carvedilol to 6.25 mg twice daily. Follow up in 3 months.    11/22/2021 Lattie Haw [facility]) - Patient was seen for Chronic systolic CHF. No medications changes. Follow up in 6 months.   11/15/2021 Will Camnitz (cardiology) - Patient was seen for Dilated cardiomyopathy. No other notes.    10/10/2021 Kara Mead (pulmonary) - Patient was seen for Obstructive sleep apnea. No other notes.    Hospital visits: Admitted to [facility] on 04/25/2022 due to Acute on chronic systolic congestive heart failure. Discharge date was 04/29/2022.   New?Medications Started at [facility] Discharge:?? amiodarone (PACERONE) atorvastatin (LIPITOR) digoxin (LANOXIN) milrinone (PRIMACOR) spironolactone (ALDACTONE) Medication Changes at Hospital Discharge: torsemide [facility]) Medications Discontinued at Hospital Discharge: carvedilol 6.25 MG tablet (COREG) simvastatin 40 MG tablet (ZOCOR) Medications that remain the same after Hospital Discharge:??  -All other medications will remain the same.    Admitted to [facility] on 04/15/2022 due to CHF exacerbation. Discharge date was 04/18/2022.    New?Medications Started at [facility] Discharge:?? No new medications Medication Changes at Hospital Discharge: No medication changes Medications Discontinued at Hospital Discharge: Entresto 24-26 MG (sacubitril-valsartan) sildenafil 50 MG tablet (VIAGRA) spironolactone 25 MG tablet (ALDACTONE) Medications that remain the same after Hospital Discharge:??  -All other medications will remain the same.   01/22/22 Patient presented to [facility] for acute upper respiratory infection and cough. Prescribed Flonase and  benzonatate. Avoid steroids due to reduced EF.   Objective:  Lab Results  Component Value Date   CREATININE 1.68 (H) 06/24/2022   BUN 16 06/24/2022   GFR 47.04 (L) 01/03/2022   EGFR 47 (L) 10/05/2021   GFRNONAA 47 (L) 06/24/2022   GFRAA 42 (L) 07/24/2020   NA 138 06/24/2022   K 3.2 (L) 06/24/2022   CALCIUM 9.3 06/24/2022   CO2 29 06/24/2022   GLUCOSE 108 (H) 06/24/2022    Lab Results  Component Value Date/Time   HGBA1C 7.0 (H) 04/15/2022 02:35 PM   HGBA1C 7.0 (H) 11/30/2021 01:01 PM   GFR 47.04 (L) 01/03/2022 08:22 AM   GFR 41.42 (L) 11/30/2021 01:01 PM   MICROALBUR <0.7 11/30/2021 01:01 PM   MICROALBUR <0.7 12/01/2020 10:33 AM    Last diabetic Eye exam: No results found for: "HMDIABEYEEXA"  Last diabetic Foot exam: No results found for: "HMDIABFOOTEX"   Lab Results  Component Value Date   CHOL 141 11/30/2021   HDL 48.90 11/30/2021   LDLCALC 76 11/30/2021   LDLDIRECT 166.4 05/17/2010   TRIG 82.0 11/30/2021   CHOLHDL 3 11/30/2021       Latest Ref Rng & Units 04/24/2022    4:01 PM 01/03/2022    8:22 AM 11/30/2021    1:01 PM  Hepatic Function  Total Protein 6.5 - 8.1 g/dL 7.2  6.8  7.5   Albumin 3.5 - 5.0 g/dL 4.0  4.0  4.3   AST 15 - 41 U/L _0 ALT 0 - 44 U/L _1 Alk Phosphatase 38 - 126 U/L 72  57  57   Total Bilirubin 0.3 - 1.2 mg/dL 1.2  0.7  1.3     Lab Results  Component Value Date/Time  TSH 1.873 04/29/2022 08:57 AM   TSH 1.59 11/30/2021 01:01 PM   TSH 1.31 05/30/2021 08:48 AM   FREET4 1.29 11/30/2021 01:01 PM   FREET4 0.88 03/05/2021 08:29 AM       Latest Ref Rng & Units 05/10/2022   11:21 AM 04/25/2022   10:07 AM 04/24/2022    4:01 PM  CBC  WBC 4.0 - 10.5 K/uL 4.9   5.0   Hemoglobin 13.0 - 17.0 g/dL 13.8  15.3    15.0  14.5   Hematocrit 39.0 - 52.0 % 44.5  45.0    44.0  45.8   Platelets 150 - 400 K/uL 180   179     Lab Results  Component Value Date/Time   VD25OH 32.91 12/01/2020 10:33 AM    Clinical ASCVD: No  The  10-year ASCVD risk score (Arnett DK, et al., 2019) is: 15.3%   Values used to calculate the score:     Age: 50 years     Sex: Male     Is Non-Hispanic African American: Yes     Diabetic: Yes     Tobacco smoker: No     Systolic Blood Pressure: 149 mmHg     Is BP treated: Yes     HDL Cholesterol: 48.9 mg/dL     Total Cholesterol: 141 mg/dL       06/06/2022   11:56 AM 11/30/2021   11:27 AM 11/20/2021    9:29 AM  Depression screen PHQ 2/9  Decreased Interest 0 0 0  Down, Depressed, Hopeless 0 1 0  PHQ - 2 Score 0 1 0  Altered sleeping  2   Tired, decreased energy  3   Change in appetite  0   Feeling bad or failure about yourself   0   Trouble concentrating  0   Moving slowly or fidgety/restless  0   Suicidal thoughts  0   PHQ-9 Score  6     CHA2DS2/VAS Stroke Risk Points  Current as of 11 minutes ago     4 >= 2 Points: High Risk  1 - 1.99 Points: Medium Risk  0 Points: Low Risk    Last Change: N/A      Details    This score determines the patient's risk of having a stroke if the  patient has atrial fibrillation.       Points Metrics  1 Has Congestive Heart Failure:  Yes    Current as of 11 minutes ago  1 Has Vascular Disease:  Yes     Current as of 11 minutes ago  1 Has Hypertension:  Yes    Current as of 11 minutes ago  0 Age:  44    Current as of 11 minutes ago  1 Has Diabetes:  Yes    Current as of 11 minutes ago  0 Had Stroke:  No  Had TIA:  No  Had Thromboembolism:  No    Current as of 11 minutes ago  0 Male:  No    Current as of 11 minutes ago         Social History   Tobacco Use  Smoking Status Former   Packs/day: 0.25   Years: 7.00   Total pack years: 1.75   Types: Cigarettes  Smokeless Tobacco Never  Tobacco Comments   03/20/2017 "quit in 1997   BP Readings from Last 3 Encounters:  06/24/22 100/68  05/10/22 112/62  04/29/22 102/73   Pulse Readings from Last 3 Encounters:  06/24/22 (!) 51  05/10/22 63  04/29/22 80   Wt Readings from  Last 3 Encounters:  06/24/22 189 lb 6.4 oz (85.9 kg)  05/10/22 193 lb 6.4 oz (87.7 kg)  04/29/22 186 lb 8.2 oz (84.6 kg)   BMI Readings from Last 3 Encounters:  06/24/22 25.69 kg/m  05/10/22 26.23 kg/m  04/29/22 25.30 kg/m    Assessment/Interventions: Review of patient past medical history, allergies, medications, health status, including review of consultants reports, laboratory and other test data, was performed as part of comprehensive evaluation and provision of chronic care management services.   SDOH:  (Social Determinants of Health) assessments and interventions performed: Yes (last 01/29/22) SDOH Interventions    Flowsheet Row Patient Outreach Telephone from 06/06/2022 in [place] ED to Hosp-Admission (Discharged) from 04/15/2022 in [place] HF PCU Chronic Care Management from 01/29/2022 in [place] at [place] Interventions -- Intervention Not Indicated --  Housing Interventions -- Intervention Not Indicated --  Transportation Interventions Intervention Not Indicated Intervention Not Indicated Intervention Not Indicated  Financial Strain Interventions -- -- Other (Comment)  [submitted PAP for Breztri]       SDOH Screenings   Food Insecurity: No Food Insecurity (04/17/2022)  Housing: Low Risk  (04/17/2022)  Transportation Needs: No Transportation Needs (06/06/2022)  Alcohol Screen: Low Risk  (04/17/2022)  Depression (PHQ2-9): Low Risk  (06/06/2022)  Financial Resource Strain: Low Risk  (01/29/2022)  Physical Activity: Inactive (11/20/2021)  Social Connections: Moderately Isolated (11/14/2020)  Stress: No Stress Concern Present (11/20/2021)  Tobacco Use: Medium Risk (06/24/2022)    CCM Care Plan  No Known Allergies  Medications Reviewed Today     Reviewed by Viona Gilmore, [facility] (Pharmacist) on 08/16/22 at 0844  Med List Status: <None>   Medication Order Taking? Sig  Documenting Provider Last Dose Status Informant  ACCU-CHEK GUIDE test strip [id number]  Use as directed-patient needs an appt Farrel Conners, MD  Active   Accu-Chek Softclix Lancets lancets [id number]  Use as directed Farrel Conners, MD  Active   albuterol (VENTOLIN HFA) 108 (90 Base) MCG/ACT inhaler [id number] No Inhale 2 puffs into the lungs every 6 (six) hours as needed for wheezing or shortness of breath. Caren Macadam, MD Taking Active Self, Spouse/Significant Other, Pharmacy Records  amiodarone (PACERONE) 200 MG tablet [id number]  Take 1 tablet (200 mg total) by mouth daily. Bensimhon, Shaune Pascal, MD  Active   atorvastatin (LIPITOR) 40 MG tablet [id number] No Take 1 tablet (40 mg total) by mouth daily. Darrick Grinder D, NP Taking Active   blood glucose meter kit and supplies KIT [id number] No Dispense based on patient and insurance preference. Use up to four times daily as directed. Caren Macadam, MD Taking Active Self, Spouse/Significant Other, Pharmacy Records  Budeson-Glycopyrrol-Formoterol (BREZTRI AEROSPHERE) 160-9-4.8 MCG/ACT AERO [id number] No Inhale 2 puffs into the lungs 2 (two) times daily. Caren Macadam, MD Taking Active Self, Spouse/Significant Other, Pharmacy Records  digoxin (LANOXIN) 0.125 MG tablet [id number] No Take 1 tablet (0.125 mg total) by mouth daily. Darrick Grinder D, NP Taking Active   ELIQUIS 5 MG TABS tablet [id number] No Take 1 tablet by mouth twice daily Koberlein, Steele Berg, MD Taking Active Self, Spouse/Significant Other, Pharmacy Records  JARDIANCE 10 MG TABS tablet [id number]  Take 1 tablet (10 mg total) by mouth daily. Farrel Conners, MD  Active   milrinone [facility]) 20 MG/100 ML SOLN infusion  [id number] No Inject 0.0212 mg/min into the vein continuous. Darrick Grinder D, NP Taking Active   omeprazole (PRILOSEC) 40 MG capsule [id number] No Take 1 capsule (40 mg total) by mouth daily. Darrick Grinder D, NP Taking Active     Discontinued 08/16/22 [phone] (Completed  Course)   Spacer/Aero-Holding Chambers (AEROCHAMBER PLUS) inhaler [id number] No Use as instructed Koberlein, Steele Berg, MD Taking Active Self, Spouse/Significant Other, Pharmacy Records  spironolactone (ALDACTONE) 25 MG tablet [id number] No Take 25 mg by mouth daily. [provider] Taking Active   torsemide (DEMADEX) 20 MG tablet [id number]  Take 2 tablets (40 mg total) by mouth daily. 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   Dizziness 12/31/2020   Patellar tendon rupture, right, initial encounter 09/13/2020   Restrictive lung disease 06/22/2020   BRBPR (bright red blood per rectum) 93/23/5573   Chronic systolic CHF (congestive heart failure) ([place]) 05/24/2020   Acute blood loss anemia 05/24/2020   Paroxysmal atrial fibrillation ([place]) 03/28/2020   Secondary hypercoagulable state ([place]) 03/28/2020   Educated about COVID-19 virus infection 02/10/2020   S/P ICD (internal cardiac defibrillator) procedure 07/18/2019   Nontraumatic complete tear of right rotator cuff 06/29/2019   Pain in left elbow 06/29/2019   Prediabetes 06/10/2018   Thrombocytopenia ([place]) 06/10/2018   AICD (automatic cardioverter/defibrillator) present 06/10/2018   History of peptic ulcer disease 06/10/2018   NSVT (nonsustained ventricular tachycardia) (HCC)    Hypotension due to drugs 02/09/2018   Difficulty controlling anger 12/31/2016   Anxiety 08/20/2016   Moderate episode of recurrent major depressive disorder ([place]) 08/20/2016   Polyp of colon 04/29/2016   Shortness of breath 03/05/2016   Essential hypertension 10/31/2015    Acute on chronic systolic heart failure ([place]) 08/29/2015   Hx of CABG 08/29/2015   Stage 3 chronic kidney disease ([place]) 08/29/2015   OSA treated with BiPAP 06/10/2011   Fatigue 06/10/2011   Obesity (BMI 30-39.9) 05/02/2010   Hemorrhoids 02/08/2010   GERD (gastroesophageal reflux disease) 02/08/2010   Rectal bleeding 02/08/2010   Hyperlipidemia 04/06/2009   Cardiomyopathy, dilated, nonischemic ([place]) 04/06/2009   Non-ischemic cardiomyopathy ([place]) 04/06/2009    Immunization History  Administered Date(s) Administered   Influenza,inj,Quad PF,6+ Mos 11/25/2018, 08/16/2021   Influenza-Unspecified 08/23/2019   PFIZER Comirnaty(Gray Top)Covid-19 Tri-Sucrose Vaccine 05/12/2020, 06/03/2020   PFIZER(Purple Top)SARS-COV-2 Vaccination 05/12/2020, 06/03/2020   Pneumococcal Conjugate-13 11/25/2018   Patient reports he doesn't feel well in general lately. He doesn't think he is sick but hasn't been able to sleep in the last 2 days and he is still vomiting on and off. He reports this happens in the mornings. He will tell Dr. Tempie Hoist. Patient reports the vomiting is still almost every day but not as man times a day.  Patient is still having swelling and is still taking the 2 torsemide per day. He also takes an additional one every once in a while due to swelling. Patient is not wearing compression stockings but it's because his hands swell more than his feet.  Patient reports his last BP was 107/60. He sometimes feels dizzy or lightheaded. He reports it  has been pretty stable.  Patient doesn't do a whole lot and runs out of breath easily. He doesn't do much driving because he gets lightheaded some but it has been happening less. He does drive himself to doctor's appts.  Patient reports he is not using the BiPAP much which could be impacting his sleep. The machine blows air too hard and they can't adjust it without getting a couple of hours of data but he can't wear it long enough. He is in the process of  getting set up with another sleep study.  Conditions to be addressed/monitored:  Hypertension, Hyperlipidemia, Atrial Fibrillation, Heart Failure, GERD, and OSA  Conditions addressed this visit: Hypertension, heart failure, OSA  Care Plan : CCM Pharmacy Care Plan  Updates made by Viona Gilmore, [place] since 08/16/2022 12:00 AM     Problem: Problem: Hypertension, Hyperlipidemia, Atrial Fibrillation, Heart Failure, GERD, Anxiety, and OSA      Long-Range Goal: Patient-Specific Goal   Start Date: 01/29/2022  Expected End Date: 01/30/2023  Recent Progress: On track  Priority: High  Note:   Current Barriers:  Unable to independently afford treatment regimen Unable to independently monitor therapeutic efficacy Unable to maintain control of diabetes  Pharmacist Clinical Goal(s):  Patient will verbalize ability to afford treatment regimen achieve adherence to monitoring guidelines and medication adherence to achieve therapeutic efficacy through collaboration with PharmD and provider.   Interventions: 1:1 collaboration with Caren Macadam, MD regarding development and update of comprehensive plan of care as evidenced by provider attestation and co-signature Inter-disciplinary care team collaboration (see longitudinal plan of care) Comprehensive medication review performed; medication list updated in electronic medical record  Hypertension (BP goal <130/80) -Controlled -Current treatment: Spironolactone 25 mg 1 tablet daily - Appropriate, Effective, Safe, Accessible -Medications previously tried: carvedilol (hypotension), Entresto (hypotension) -Current home readings: 106/73 (lowest since hospital), 107-115/69-76  -Current dietary habits: limits salt intake -Current exercise habits: exercising now more regularly -Reports hypotensive/hypertensive symptoms -Educated on BP goals and benefits of medications for prevention of heart attack, stroke and kidney damage; Proper BP  monitoring technique; Symptoms of hypotension and importance of maintaining adequate hydration; -Counseled to monitor BP at home a few times a week, document, and provide log at future appointments -Counseled on diet and exercise extensively Recommended to continue current medication  Hyperlipidemia: (LDL goal < 70) -Not ideally controlled -Current treatment: Atorvastatin 40 mg 1 tablet daily - Appropriate, Query effective, Safe, Accessible -Medications previously tried: simvastatin (drug interaction) -Current dietary patterns: eats very little red meat; mostly chicken and fish -Current exercise habits: going to the gym more now -Educated on Cholesterol goals;  Benefits of statin for ASCVD risk reduction; Importance of limiting foods high in cholesterol; Exercise goal of 150 minutes per week; -Counseled on diet and exercise extensively Recommended to continue current medication  Diabetes (A1c goal <7%) -Not ideally controlled -Current medications: Jardiance 10 mg 1 tablet daily - Appropriate, Query effective, Safe, Accessible -Medications previously tried: none  -Current home glucose readings fasting glucose: 115-117 (varies time of day and checks a couple times a week) post prandial glucose: n/a -Denies hypoglycemic/hyperglycemic symptoms -Current meal patterns: not eating as much since hospital visit breakfast: did not discuss  lunch: did not discuss   dinner: did not discuss  snacks: did not discuss  drinks: did not discuss  -Current exercise: going to the gym now -Educated on A1c and blood sugar goals; Benefits of routine self-monitoring of blood sugar; Carbohydrate counting and/or plate method -Counseled to check  feet daily and get yearly eye exams -Counseled on diet and exercise extensively Recommended repeat A1c.  Atrial Fibrillation (Goal: prevent stroke and major bleeding) -Controlled -CHADSVASC: 4 -Current treatment: Rhythm control: amiodarone 200 mg 1 tablet  twice daily ; digoxin 0.125 mg 1 tablet daily - Appropriate, Effective, Safe, Accessible Anticoagulation: Eliquis 5 mg 1 tablet twice daily - Appropriate, Effective, Safe, Accessible -Medications previously tried: none -Home BP and HR readings: refer to above  -Counseled on increased risk of stroke due to Afib and benefits of anticoagulation for stroke prevention; importance of adherence to anticoagulant exactly as prescribed; bleeding risk associated with Eliquis and importance of self-monitoring for signs/symptoms of bleeding; avoidance of NSAIDs due to increased bleeding risk with anticoagulants; -Recommended to continue current medication  Heart Failure (Goal: manage symptoms and prevent exacerbations) -Controlled -Last ejection fraction: 15-20% (Date: 02/04/20) -HF type: Systolic -NYHA Class: III (marked limitation of activity) -AHA HF Stage: C (Heart disease and symptoms present) -Current treatment: Amiodarone 200 mg 1 tablet twice daily - Appropriate, Effective, Safe, Accessible Digoxin 0.125 mg 1 tablet daily - Appropriate, Effective, Safe, Accessible Spironolactone 25 mg 1 tablet daily - Appropriate, Effective, Safe, Accessible Jardiance 10 mg 1 tablet daily - Appropriate, Effective, Safe, Accessible Torsemide 20 mg 2 tablets daily - Appropriate, Effective, Safe, Accessible -Medications previously tried: none  -Current home BP/HR readings: refer to above -Current dietary habits: eats as little sodium as possible; doesn't use salt at home, fresh or frozen; avoids things > 100 mg of sodium per serving; asks about sodium content in restaurants -Current exercise habits: going to the gym regularly now -Educated on Benefits of medications for managing symptoms and prolonging life Importance of weighing daily; if you gain more than 3 pounds in one day or 5 pounds in one week, call cardiologist. -Counseled on diet and exercise extensively Recommended to continue current medication  OSA  (Goal: control symptoms and prevent exacerbations) -Controlled -Current treatment  Breztri 160-9-4.8 mcg/act 2 puffs twice daily - taking once daily-  Appropriate, Effective, Safe, Accessible Albuterol HFA as needed - Appropriate, Effective, Safe, Accessible -Medications previously tried: none  -Gold Grade: Gold 2 (FEV1 50-79%) -Current COPD Classification:  B (high sx, <2 exacerbations/yr) -MMRC/CAT score: n/a -Pulmonary function testing: 03/2020 -Exacerbations requiring treatment in last 6 months: none -Patient reports consistent use of maintenance inhaler -Frequency of rescue inhaler use: uses very infrequently -Counseled on Proper inhaler technique; Benefits of consistent maintenance inhaler use When to use rescue inhaler -Recommended to continue current medication  GERD (Goal: minimize symptoms) -Controlled -Current treatment  Omeprazole 40 mg 1 capsule daily - Appropriate, Effective, Safe, Accessible -Medications previously tried: none  -Counseled on non-pharmacologic management of symptoms such as elevating the head of your bed, avoiding eating 2-3 hours before bed, avoiding triggering foods such as acidic, spicy, or fatty foods, eating smaller meals, and wearing clothes that are loose around the waist.  Health Maintenance -Vaccine gaps:  shingrix, COVID booster, tetanus, influenza -Current therapy:  Multivitamin 1 tablet daily Sildenafil 50 mg 1 tablet as needed -Educated on Cost vs benefit of each product must be carefully weighed by individual consumer -Patient is satisfied with current therapy and denies issues -Recommended to continue current medication  Patient Goals/Self-Care Activities Patient will:  - check glucose a couple times a week, document, and provide at future appointments check blood pressure a couple times a week, document, and provide at future appointments weigh daily, and contact provider if weight gain of > 3 lbs in one day  or > 5 lbs in one  week  Follow Up Plan: Telephone follow up appointment with care management team member scheduled for: 4 months       Medication Assistance:  Breztri obtained through [place] medication [place].  Enrollment ends 11/03/22  Compliance/Adherence/Medication fill history: Care Gaps: Shingrix, COVID booster, tetanus, foot exam, eye exam, influenza Last BP - 86/54 on 04/05/2022 Last A1C - 7.0 on 04/15/2022  Star-Rating Drugs: Atorvastatin 40 mg - last filled 08/12/2022 30 DS at [facility] 10 mg - last filled 08/12/2022 30 DS at [facility]  Patient's preferred pharmacy is:  [facility] ([facility] [place] - [address]  ([place]) [place] [address] Phone: [phone] Fax: [phone]  Uses pill box? Yes - weekly  Pt endorses 99% compliance - might miss evening dose with medications  We discussed: Current pharmacy is preferred with insurance plan and patient is satisfied with pharmacy services Patient decided to: Continue current medication management strategy  Care Plan and Follow Up Patient Decision:  Patient agrees to Care Plan and Follow-up.  Plan: Telephone follow up appointment with care management team member scheduled for:  4 months  Jeni Salles, PharmD, [place] [facility] [place] [phone]

## 2022-08-15 NOTE — Chronic Care Management (AMB) (Signed)
    Chronic Care Management Pharmacy Assistant   Name: Limmie Schoenberg  MRN: [id number] DOB: August 23, 1961  08/16/2022 APPOINTMENT REMINDER  [name] was reminded to have all medications, supplements and any blood glucose and blood pressure readings available for review with Jeni Salles, Pharm. D, at his telephone visit on 08/16/2022 at 8:30.  Care Gaps: AWV - scheduled 11/26/2022 Last BP - 86/54 on 04/05/2022 Last A1C - 7.0 on 04/15/2022 Foot exam - never done Eye exam - never done Shingrix - never done Covid - overdue Flu - due Tdap - postponed  Star Rating Drug: Atorvastatin 40 mg - last filled 08/12/2022 30 DS at [facility] Jardiance 10 mg - last filled 08/12/2022 30 DS at [facility]  Any gaps in medications fill history? No

## 2022-08-16 ENCOUNTER — Other Ambulatory Visit: Payer: Self-pay | Admitting: Family Medicine

## 2022-08-16 ENCOUNTER — Ambulatory Visit: Payer: Medicare Other | Admitting: Pharmacist

## 2022-08-16 ENCOUNTER — Telehealth (HOSPITAL_COMMUNITY): Payer: Self-pay | Admitting: Cardiology

## 2022-08-16 ENCOUNTER — Telehealth: Payer: Self-pay | Admitting: Pharmacist

## 2022-08-16 DIAGNOSIS — R059 Cough, unspecified: Secondary | ICD-10-CM

## 2022-08-16 DIAGNOSIS — I10 Essential (primary) hypertension: Secondary | ICD-10-CM

## 2022-08-16 DIAGNOSIS — I5022 Chronic systolic (congestive) heart failure: Secondary | ICD-10-CM

## 2022-08-16 MED ORDER — ALBUTEROL SULFATE HFA 108 (90 BASE) MCG/ACT IN AERS: 2.0000 | INHALATION_SPRAY | Freq: Four times a day (QID) | RESPIRATORY_TRACT | 1 refills | Status: DC | PRN

## 2022-08-16 NOTE — Telephone Encounter (Signed)
Noted  

## 2022-08-16 NOTE — Telephone Encounter (Signed)
It looks like I already sent the test strips and lancets on 10/9 and 10/10. I sent the albuterol inhaler today.

## 2022-08-16 NOTE — Patient Instructions (Signed)
Hi Ahmet,  It was great to catch up again! I hope you feel better soon!  Please let me know if the refills are not sent in by Monday.  Please reach out to me if you have any questions or need anything before our follow up!  Best, Maddie  Jeni Salles, PharmD, [place] [facility] [place]   Visit Information   Goals Addressed   None    Patient Care Plan: CCM Pharmacy Care Plan     Problem Identified: Problem: Hypertension, Hyperlipidemia, Atrial Fibrillation, Heart Failure, GERD, Anxiety, and OSA      Long-Range Goal: Patient-Specific Goal   Start Date: 01/29/2022  Expected End Date: 01/30/2023  Recent Progress: On track  Priority: High  Note:   Current Barriers:  Unable to independently afford treatment regimen Unable to independently monitor therapeutic efficacy Unable to maintain control of diabetes  Pharmacist Clinical Goal(s):  Patient will verbalize ability to afford treatment regimen achieve adherence to monitoring guidelines and medication adherence to achieve therapeutic efficacy through collaboration with PharmD and provider.   Interventions: 1:1 collaboration with Caren Macadam, MD regarding development and update of comprehensive plan of care as evidenced by provider attestation and co-signature Inter-disciplinary care team collaboration (see longitudinal plan of care) Comprehensive medication review performed; medication list updated in electronic medical record  Hypertension (BP goal <130/80) -Controlled -Current treatment: Spironolactone 25 mg 1 tablet daily - Appropriate, Effective, Safe, Accessible -Medications previously tried: carvedilol (hypotension), Entresto (hypotension) -Current home readings: 106/73 (lowest since hospital), 107-115/69-76  -Current dietary habits: limits salt intake -Current exercise habits: exercising now more regularly -Reports hypotensive/hypertensive  symptoms -Educated on BP goals and benefits of medications for prevention of heart attack, stroke and kidney damage; Proper BP monitoring technique; Symptoms of hypotension and importance of maintaining adequate hydration; -Counseled to monitor BP at home a few times a week, document, and provide log at future appointments -Counseled on diet and exercise extensively Recommended to continue current medication  Hyperlipidemia: (LDL goal < 70) -Not ideally controlled -Current treatment: Atorvastatin 40 mg 1 tablet daily - Appropriate, Query effective, Safe, Accessible -Medications previously tried: simvastatin (drug interaction) -Current dietary patterns: eats very little red meat; mostly chicken and fish -Current exercise habits: going to the gym more now -Educated on Cholesterol goals;  Benefits of statin for ASCVD risk reduction; Importance of limiting foods high in cholesterol; Exercise goal of 150 minutes per week; -Counseled on diet and exercise extensively Recommended to continue current medication  Diabetes (A1c goal <7%) -Not ideally controlled -Current medications: Jardiance 10 mg 1 tablet daily - Appropriate, Query effective, Safe, Accessible -Medications previously tried: none  -Current home glucose readings fasting glucose: 115-117 (varies time of day and checks a couple times a week) post prandial glucose: n/a -Denies hypoglycemic/hyperglycemic symptoms -Current meal patterns: not eating as much since hospital visit breakfast: did not discuss  lunch: did not discuss   dinner: did not discuss  snacks: did not discuss  drinks: did not discuss  -Current exercise: going to the gym now -Educated on A1c and blood sugar goals; Benefits of routine self-monitoring of blood sugar; Carbohydrate counting and/or plate method -Counseled to check feet daily and get yearly eye exams -Counseled on diet and exercise extensively Recommended repeat A1c.  Atrial Fibrillation (Goal:  prevent stroke and major bleeding) -Controlled -CHADSVASC: 4 -Current treatment: Rhythm control: amiodarone 200 mg 1 tablet twice daily ; digoxin 0.125 mg 1 tablet daily - Appropriate, Effective, Safe, Accessible Anticoagulation: Eliquis 5  mg 1 tablet twice daily - Appropriate, Effective, Safe, Accessible -Medications previously tried: none -Home BP and HR readings: refer to above  -Counseled on increased risk of stroke due to Afib and benefits of anticoagulation for stroke prevention; importance of adherence to anticoagulant exactly as prescribed; bleeding risk associated with Eliquis and importance of self-monitoring for signs/symptoms of bleeding; avoidance of NSAIDs due to increased bleeding risk with anticoagulants; -Recommended to continue current medication  Heart Failure (Goal: manage symptoms and prevent exacerbations) -Controlled -Last ejection fraction: 15-20% (Date: 02/04/20) -HF type: Systolic -NYHA Class: III (marked limitation of activity) -AHA HF Stage: C (Heart disease and symptoms present) -Current treatment: Amiodarone 200 mg 1 tablet twice daily - Appropriate, Effective, Safe, Accessible Digoxin 0.125 mg 1 tablet daily - Appropriate, Effective, Safe, Accessible Spironolactone 25 mg 1 tablet daily - Appropriate, Effective, Safe, Accessible Jardiance 10 mg 1 tablet daily - Appropriate, Effective, Safe, Accessible Torsemide 20 mg 2 tablets daily - Appropriate, Effective, Safe, Accessible -Medications previously tried: none  -Current home BP/HR readings: refer to above -Current dietary habits: eats as little sodium as possible; doesn't use salt at home, fresh or frozen; avoids things > 100 mg of sodium per serving; asks about sodium content in restaurants -Current exercise habits: going to the gym regularly now -Educated on Benefits of medications for managing symptoms and prolonging life Importance of weighing daily; if you gain more than 3 pounds in one day or 5 pounds  in one week, call cardiologist. -Counseled on diet and exercise extensively Recommended to continue current medication  OSA (Goal: control symptoms and prevent exacerbations) -Controlled -Current treatment  Breztri 160-9-4.8 mcg/act 2 puffs twice daily - taking once daily-  Appropriate, Effective, Safe, Accessible Albuterol HFA as needed - Appropriate, Effective, Safe, Accessible -Medications previously tried: none  -Gold Grade: Gold 2 (FEV1 50-79%) -Current COPD Classification:  B (high sx, <2 exacerbations/yr) -MMRC/CAT score: n/a -Pulmonary function testing: 03/2020 -Exacerbations requiring treatment in last 6 months: none -Patient reports consistent use of maintenance inhaler -Frequency of rescue inhaler use: uses very infrequently -Counseled on Proper inhaler technique; Benefits of consistent maintenance inhaler use When to use rescue inhaler -Recommended to continue current medication  GERD (Goal: minimize symptoms) -Controlled -Current treatment  Omeprazole 40 mg 1 capsule daily - Appropriate, Effective, Safe, Accessible -Medications previously tried: none  -Counseled on non-pharmacologic management of symptoms such as elevating the head of your bed, avoiding eating 2-3 hours before bed, avoiding triggering foods such as acidic, spicy, or fatty foods, eating smaller meals, and wearing clothes that are loose around the waist.  Health Maintenance -Vaccine gaps:  shingrix, COVID booster, tetanus, influenza -Current therapy:  Multivitamin 1 tablet daily Sildenafil 50 mg 1 tablet as needed -Educated on Cost vs benefit of each product must be carefully weighed by individual consumer -Patient is satisfied with current therapy and denies issues -Recommended to continue current medication  Patient Goals/Self-Care Activities Patient will:  - check glucose a couple times a week, document, and provide at future appointments check blood pressure a couple times a week, document, and  provide at future appointments weigh daily, and contact provider if weight gain of > 3 lbs in one day or > 5 lbs in one week  Follow Up Plan: Telephone follow up appointment with care management team member scheduled for: 4 months       Patient verbalizes understanding of instructions and care plan provided today and agrees to view in [place]. Active MyChart status and patient understanding of how  to access instructions and care plan via MyChart confirmed with patient.    Telephone follow up appointment with pharmacy team member scheduled for: 4 months  Viona Gilmore, [facility]

## 2022-08-16 NOTE — Telephone Encounter (Signed)
Abnormal labs received from [facility]  Mg 3.0  Per Allena Katz NP Stop mag supplement   Pt aware and voiced understanding -reports he is not currently taking Mag however will review mens multi vita min

## 2022-08-16 NOTE — Telephone Encounter (Signed)
During CCM call this morning, patient requested urgent refills for albuterol, accu-chek test strips and lancets as he is completely out. He is set up for a TOC visit with Dr. Legrand Como on 10/24. Will route refill request to teamcare and pt requested refills to be sent to [facility].

## 2022-08-19 ENCOUNTER — Ambulatory Visit (HOSPITAL_BASED_OUTPATIENT_CLINIC_OR_DEPARTMENT_OTHER)
Admission: RE | Admit: 2022-08-19 | Discharge: 2022-08-19 | Disposition: A | Discharge status: Home / Self Care | Payer: Medicare Other | Source: Ambulatory Visit | Attending: Internal Medicine | Admitting: Internal Medicine

## 2022-08-19 ENCOUNTER — Encounter (HOSPITAL_COMMUNITY): Payer: Self-pay | Admitting: Internal Medicine

## 2022-08-19 ENCOUNTER — Telehealth (HOSPITAL_COMMUNITY): Payer: Self-pay

## 2022-08-19 ENCOUNTER — Encounter (HOSPITAL_COMMUNITY): Payer: Self-pay

## 2022-08-19 VITALS — BP 104/60 | HR 60 | Wt 182.0 lb

## 2022-08-19 DIAGNOSIS — R0602 Shortness of breath: Secondary | ICD-10-CM | POA: Insufficient documentation

## 2022-08-19 DIAGNOSIS — R110 Nausea: Secondary | ICD-10-CM | POA: Diagnosis not present

## 2022-08-19 DIAGNOSIS — I272 Pulmonary hypertension, unspecified: Secondary | ICD-10-CM

## 2022-08-19 DIAGNOSIS — Z79899 Other long term (current) drug therapy: Secondary | ICD-10-CM | POA: Insufficient documentation

## 2022-08-19 DIAGNOSIS — I255 Ischemic cardiomyopathy: Secondary | ICD-10-CM | POA: Diagnosis not present

## 2022-08-19 DIAGNOSIS — Z7984 Long term (current) use of oral hypoglycemic drugs: Secondary | ICD-10-CM | POA: Insufficient documentation

## 2022-08-19 DIAGNOSIS — I480 Paroxysmal atrial fibrillation: Secondary | ICD-10-CM | POA: Insufficient documentation

## 2022-08-19 DIAGNOSIS — K219 Gastro-esophageal reflux disease without esophagitis: Secondary | ICD-10-CM | POA: Diagnosis not present

## 2022-08-19 DIAGNOSIS — Z951 Presence of aortocoronary bypass graft: Secondary | ICD-10-CM | POA: Insufficient documentation

## 2022-08-19 DIAGNOSIS — R001 Bradycardia, unspecified: Secondary | ICD-10-CM | POA: Diagnosis not present

## 2022-08-19 DIAGNOSIS — N179 Acute kidney failure, unspecified: Secondary | ICD-10-CM | POA: Diagnosis not present

## 2022-08-19 DIAGNOSIS — I472 Ventricular tachycardia, unspecified: Secondary | ICD-10-CM | POA: Diagnosis not present

## 2022-08-19 DIAGNOSIS — G4700 Insomnia, unspecified: Secondary | ICD-10-CM | POA: Diagnosis not present

## 2022-08-19 DIAGNOSIS — I2510 Atherosclerotic heart disease of native coronary artery without angina pectoris: Secondary | ICD-10-CM | POA: Diagnosis not present

## 2022-08-19 DIAGNOSIS — I454 Nonspecific intraventricular block: Secondary | ICD-10-CM | POA: Insufficient documentation

## 2022-08-19 DIAGNOSIS — I428 Other cardiomyopathies: Secondary | ICD-10-CM | POA: Diagnosis not present

## 2022-08-19 DIAGNOSIS — I5023 Acute on chronic systolic (congestive) heart failure: Secondary | ICD-10-CM | POA: Diagnosis not present

## 2022-08-19 DIAGNOSIS — I5022 Chronic systolic (congestive) heart failure: Secondary | ICD-10-CM | POA: Diagnosis not present

## 2022-08-19 DIAGNOSIS — N1831 Chronic kidney disease, stage 3a: Secondary | ICD-10-CM | POA: Insufficient documentation

## 2022-08-19 DIAGNOSIS — Z7901 Long term (current) use of anticoagulants: Secondary | ICD-10-CM | POA: Insufficient documentation

## 2022-08-19 DIAGNOSIS — I5082 Biventricular heart failure: Secondary | ICD-10-CM | POA: Insufficient documentation

## 2022-08-19 DIAGNOSIS — I351 Nonrheumatic aortic (valve) insufficiency: Secondary | ICD-10-CM | POA: Diagnosis not present

## 2022-08-19 DIAGNOSIS — G4733 Obstructive sleep apnea (adult) (pediatric): Secondary | ICD-10-CM | POA: Insufficient documentation

## 2022-08-19 DIAGNOSIS — E1122 Type 2 diabetes mellitus with diabetic chronic kidney disease: Secondary | ICD-10-CM | POA: Insufficient documentation

## 2022-08-19 DIAGNOSIS — I071 Rheumatic tricuspid insufficiency: Secondary | ICD-10-CM | POA: Diagnosis not present

## 2022-08-19 DIAGNOSIS — Z87891 Personal history of nicotine dependence: Secondary | ICD-10-CM | POA: Diagnosis not present

## 2022-08-19 DIAGNOSIS — E785 Hyperlipidemia, unspecified: Secondary | ICD-10-CM | POA: Diagnosis not present

## 2022-08-19 LAB — COMPREHENSIVE METABOLIC PANEL
ALT: 28 U/L (ref 0–44)
AST: 25 U/L (ref 15–41)
Albumin: 3.9 g/dL (ref 3.5–5.0)
Alkaline Phosphatase: 75 U/L (ref 38–126)
Anion gap: 12 (ref 5–15)
BUN: 24 mg/dL — ABNORMAL HIGH (ref 6–20)
CO2: 26 mmol/L (ref 22–32)
Calcium: 9.4 mg/dL (ref 8.9–10.3)
Chloride: 97 mmol/L — ABNORMAL LOW (ref 98–111)
Creatinine, Ser: 2.35 mg/dL — ABNORMAL HIGH (ref 0.61–1.24)
GFR, Estimated: 31 mL/min — ABNORMAL LOW (ref >60)
Glucose, Bld: 142 mg/dL — ABNORMAL HIGH (ref 70–99)
Potassium: 3.9 mmol/L (ref 3.5–5.1)
Sodium: 135 mmol/L (ref 135–145)
Total Bilirubin: 1.9 mg/dL — ABNORMAL HIGH (ref 0.3–1.2)
Total Protein: 7.6 g/dL (ref 6.5–8.1)

## 2022-08-19 LAB — CBC
HCT: 33.1 % — ABNORMAL LOW (ref 39.0–52.0)
Hemoglobin: 10.5 g/dL — ABNORMAL LOW (ref 13.0–17.0)
MCH: 26.4 pg (ref 26.0–34.0)
MCHC: 31.7 g/dL (ref 30.0–36.0)
MCV: 83.4 fL (ref 80.0–100.0)
Platelets: 187 10*3/uL (ref 150–400)
RBC: 3.97 MIL/uL — ABNORMAL LOW (ref 4.22–5.81)
RDW: 16.6 % — ABNORMAL HIGH (ref 11.5–15.5)
WBC: 5.2 10*3/uL (ref 4.0–10.5)
nRBC: 0 % (ref 0.0–0.2)

## 2022-08-19 LAB — LACTIC ACID, PLASMA: Lactic Acid, Venous: 1.8 mmol/L (ref 0.5–1.9)

## 2022-08-19 LAB — BRAIN NATRIURETIC PEPTIDE: B Natriuretic Peptide: 715.1 pg/mL — ABNORMAL HIGH (ref 0.0–100.0)

## 2022-08-19 NOTE — H&P (Incomplete)
Advanced Heart Failure Team History and Physical Note   PCP:  Caren Macadam, MD (Inactive)  PCP-Cardiology: Minus Breeding, MD    AHFC: Dr. Gwenyth Allegra AHFC: Dr. Posey Pronto   Reason for Admission: Optimization of Acute on Chronic Biventricular Heart Failure, NYHA Class IV   HPI:    Bruce Robertson is a 61 y.o. 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    Followed by Dr. [name]. Also followed closely at [facility] and see's Dr. Posey Pronto. Per last clinic note, 3/23, he had cardiopulmonary stress testing that was reassuring.Peak VO2 near 16. Chronically NYHA Class II. Has been on good GDMT. Not felt to need advanced therapies at time of last assessment by Dr. Posey Pronto.    Echo at [facility] 3/23, EF < 15%, RV moderately reduced, mild MR, moderate TR. It's not clear if he has ever had a cMRI.    Admitted 6/23 w/ a/c CHF and volume overload. Hospitalization was c/b hypotension, requiring discontinuation of several HF medications. Lactate level was normal and renal function remained stable, . Echo EF < 20%, RV moderately reduced, mild MR and progression of TR to severe, GIIIDD and severe BAE. He was diuresed w/ IV Lasix and transitioned back to PO torsemide. Referred to [facility] clinic.    Seen in [facility] 6/23, NYHA IIIb-IV symptoms. Concern for low output and arranged for RHC.   Admitted from cath lab 6/23 after RHC showed elevated left sided filling pressures and low output. Started on milrinone and diuresed with IV lasix. GDMT titrated. Mexilitene switched to amiodarone for better PVC suppression. Discharged home with milrinone with [facility] follow up arranged, weight 186 lbs.   Has been undergoing transplant w/u at [facility]. Remains on milrinone. Echo 06/24/22 EF < 20% G3DD RV severely decreased Personally reviewed.    Seen in [facility] on 10/16 for f/u. Feeling terrible. No energy. Unable to do ADLs. SOB and fatigued with any activity. No problems with milrinone pump. Last Scr 2.1. ICD interrogation: No AF/VT. Optivol up. Rx given for Furoscix.    Given progression to NYHA Class IV + failing inotropes, decision made to admit for RHC and anticipated escalation of therapy to MCS w/ 5.5 Impella for better optimization for bridge to transplant.     Review of Systems: [y] = yes, [ ]  = no   General: Weight gain [ ] ; Weight loss [ ] ; Anorexia [ ] ; Fatigue [Y ]; Fever [ ] ; Chills [ ] ; Weakness [Y ]  Cardiac: Chest pain/pressure [ ] ; Resting SOB [ Y]; Exertional SOB [Y ]; Orthopnea [ ] ; Pedal Edema [ ] ; Palpitations [ ] ; Syncope [ ] ; Presyncope [ ] ; Paroxysmal nocturnal dyspnea[ ]   Pulmonary: Cough [ ] ; Wheezing[ ] ; Hemoptysis[ ] ; Sputum [ ] ; Snoring [ ]   GI: Vomiting[ ] ; Dysphagia[ ] ; Melena[ ] ; Hematochezia [ ] ; Heartburn[ ] ; Abdominal pain [ ] ; Constipation [ ] ; Diarrhea [ ] ; BRBPR [ ]   GU: Hematuria[ ] ; Dysuria [ ] ; Nocturia[ ]   Vascular: Pain in legs with walking [ ] ; Pain in feet with lying flat [ ] ; Non-healing sores [ ] ; Stroke [ ] ; TIA [ ] ; Slurred speech [ ] ;  Neuro: Headaches[ ] ; Vertigo[ ] ; Seizures[ ] ; Paresthesias[ ] ;Blurred vision [ ] ; Diplopia [ ] ; Vision changes [ ]   Ortho/Skin: Arthritis [ ] ;  Joint pain [ ] ; Muscle pain [ ] ; Joint swelling [ ] ; Back Pain [ ] ; Rash [ ]   Psych: Depression[ ] ; Anxiety[ ]   Heme: Bleeding problems [ ] ; Clotting disorders [ ] ; Anemia [ ]   Endocrine: Diabetes [ Y]; Thyroid dysfunction[ ]    Home Medications Prior to Admission medications   Medication Sig Start Date End Date Taking? Authorizing Provider  ACCU-CHEK GUIDE test strip Use as directed-patient needs an appt 08/12/22   Farrel Conners, MD  Accu-Chek Softclix Lancets lancets Use as directed 08/13/22   Farrel Conners, MD  albuterol (VENTOLIN HFA) 108 (90 Base) MCG/ACT inhaler Inhale 2 puffs into the lungs  every 6 (six) hours as needed for wheezing or shortness of breath. 08/16/22   Farrel Conners, MD  amiodarone (PACERONE) 200 MG tablet Take 1 tablet (200 mg total) by mouth daily. 06/28/22   Bensimhon, Shaune Pascal, MD  atorvastatin (LIPITOR) 40 MG tablet Take 1 tablet (40 mg total) by mouth daily. 04/30/22   Clegg, Amy D, NP  blood glucose meter kit and supplies KIT Dispense based on patient and insurance preference. Use up to four times daily as directed. 02/14/20   Koberlein, Steele Berg, MD  Budeson-Glycopyrrol-Formoterol (BREZTRI AEROSPHERE) 160-9-4.8 MCG/ACT AERO Inhale 2 puffs into the lungs 2 (two) times daily. 02/06/22   Caren Macadam, MD  digoxin (LANOXIN) 0.125 MG tablet Take 1 tablet (0.125 mg total) by mouth daily. 04/30/22   Clegg, Amy D, NP  ELIQUIS 5 MG TABS tablet Take 1 tablet by mouth twice daily 09/28/21   Koberlein, Steele Berg, MD  JARDIANCE 10 MG TABS tablet Take 1 tablet (10 mg total) by mouth daily. 08/12/22   Farrel Conners, MD  milrinone [facility]) 20 MG/100 ML SOLN infusion Inject 0.0212 mg/min into the vein continuous. 04/29/22   Clegg, Amy D, NP  omeprazole (PRILOSEC) 40 MG capsule Take 1 capsule (40 mg total) by mouth daily. 04/29/22   Conrad [place], NP  Spacer/Aero-Holding Chambers (AEROCHAMBER PLUS) inhaler Use as instructed 05/25/21   Caren Macadam, MD  spironolactone (ALDACTONE) 25 MG tablet Take 25 mg by mouth daily.    [provider]  torsemide (DEMADEX) 20 MG tablet Take 2 tablets (40 mg total) by mouth daily. 06/24/22   Bensimhon, Shaune Pascal, MD    Past Medical History: Past Medical History:  Diagnosis Date   AICD (automatic cardioverter/defibrillator) present    Anxiety 08/20/2016   Barrett's esophagus 2002   Cardiomyopathy    Nonischemic. EF has been about 45%. S/P CABG.;  b.  Echo 4/14: EF 25%, global HK with inf and mid apical AK, restrictive physiology with E/e' > 15 (elevated LV filling pressure), trivial AI/MR, mild to mod LAE, mild RVE, mild  reduced RVSF, mild RAE, mod TR, PASP 74 (severe pulmonary HTN)    CHF (congestive heart failure) (HCC)    CHF exacerbation (HCC) 07/31/2016   Colon polyps 2002   Coronary artery disease    Diabetes mellitus without complication (HCC)    Type II   Dyslipidemia    hx (03/20/2017)   GERD (gastroesophageal reflux disease)    History of bleeding peptic ulcer    History of echocardiogram    Echo 10/16:  EF 25-30%, poss non-compaction, diff HK with inf-lat and apical HK, restrictive physio, mild AI, severe LAE, mild RVE with mild reduced RVSF, PASP 42 mmHg   Hyperplastic rectal polyp    Moderate episode of recurrent major depressive disorder ([place]) 08/20/2016  09/14/20 - not current   OSA on CPAP    Mild   Pneumonia 09/2018   Tobacco abuse    Remote    Past Surgical History: Past Surgical History:  Procedure Laterality Date   CARDIAC CATHETERIZATION  2007;  2009   CARDIAC DEFIBRILLATOR PLACEMENT  03/20/2017   COLONOSCOPY WITH PROPOFOL N/A 01/21/2019   Procedure: COLONOSCOPY WITH PROPOFOL;  Surgeon: Milus Banister, MD;  Location: [facility];  Service: Endoscopy;  Laterality: N/A;   CORONARY ARTERY BYPASS GRAFT  2007   Had a left main dissection after catheterization. Underwent a saphenous vein graft to the LAD and a saphenous vein graft to obtuse marginal.   HEMORRHOID SURGERY N/A 08/23/2020   Procedure: HEMORRHOIDECTOMY WITH LIGATION AND HEMORRHOIDOPEXY;  Surgeon: Michael Boston, MD;  Location: [facility];  Service: General;  Laterality: N/A;   ICD IMPLANT N/A 03/20/2017   Procedure: ICD Implant;  Surgeon: Constance Haw, MD;  Location: [place] [facility];  Service: Cardiovascular;  Laterality: N/A;   IR FLUORO GUIDE CV LINE RIGHT  04/29/2022   IR US GUIDE VASC ACCESS RIGHT  04/29/2022   PATELLAR TENDON REPAIR Right 2005   PATELLAR TENDON REPAIR Right 09/21/2020   Procedure: PATELLA TENDON REPAIR;  Surgeon: Leandrew Koyanagi, MD;  Location: [facility];  Service: Orthopedics;  Laterality:  Right;   POLYPECTOMY  01/21/2019   Procedure: POLYPECTOMY;  Surgeon: Milus Banister, MD;  Location: [facility];  Service: Endoscopy;;   RECTAL EXAM UNDER ANESTHESIA Left 08/23/2020   Procedure: RECTAL EXAM UNDER ANESTHESIA;  Surgeon: Michael Boston, MD;  Location: [facility];  Service: General;  Laterality: Left;   RIGHT HEART CATH N/A 04/25/2022   Procedure: RIGHT HEART CATH;  Surgeon: Jolaine Artist, MD;  Location: [place] CV LAB;  Service: Cardiovascular;  Laterality: N/A;    Family History:  Family History  Problem Relation Age of Onset   Hypertension Mother    Osteoarthritis Mother    Heart failure Father 28   High blood pressure Father    Bipolar disorder Daughter    Diabetes Daughter    Cancer Maternal Uncle        uncertain type   Cancer Maternal Grandmother        uncertain type   Diabetes Niece    Lung cancer Other    Asthma Neg Hx    Breast cancer Neg Hx     Social History: Social History   Socioeconomic History   Marital status: Significant Other    Spouse name: Not on file   Number of children: 5   Years of education: Not on file   Highest education level: High school graduate  Occupational History   Occupation: Civil engineer, contracting    Employer: [employer]    Comment: Disability 4 years  Tobacco Use   Smoking status: Former    Packs/day: 0.25    Years: 7.00    Total pack years: 1.75    Types: Cigarettes   Smokeless tobacco: Never   Tobacco comments:    03/20/2017 "quit in 1997  Vaping Use   Vaping Use: Former   Substances: [place]   Devices: vaped for about a month  Substance and Sexual Activity   Alcohol use: Not Currently    Comment: socially   Drug use: Yes    Frequency: 1.0 times per week    Types: Marijuana   Sexual activity: Yes  Other Topics Concern   Not on file  Social History Narrative  Single   Social Determinants of Health   Financial Resource Strain: Low Risk  (01/29/2022)   Overall Financial Resource Strain (CARDIA)     Difficulty of Paying Living Expenses: Not very hard  Food Insecurity: No Food Insecurity (04/17/2022)   Hunger Vital Sign    Worried About Running Out of Food in the Last Year: Never true    Ran Out of Food in the Last Year: Never true  Transportation Needs: No Transportation Needs (06/06/2022)   PRAPARE - Hydrologist (Medical): No    Lack of Transportation (Non-Medical): No  Physical Activity: Inactive (11/20/2021)   Exercise Vital Sign    Days of Exercise per Week: 0 days    Minutes of Exercise per Session: 0 min  Stress: No Stress Concern Present (11/20/2021)   [place]    Feeling of Stress : Only a little  Social Connections: Moderately Isolated (11/14/2020)   Social Connection and Isolation Panel [NHANES]    Frequency of Communication with Friends and Family: More than three times a week    Frequency of Social Gatherings with Friends and Family: Never    Attends Religious Services: More than 4 times per year    Active Member of Clubs or Organizations: No    Attends Archivist Meetings: Never    Marital Status: Divorced    Allergies:  No Known Allergies  Objective:    Vital Signs:   Temp:  [98.4 F (36.9 C)] 98.4 F (36.9 C) (10/17 0951) Pulse Rate:  [54] 54 (10/17 0951) Resp:  [14] 14 (10/17 0951) BP: (108)/(64) 108/64 (10/17 0951) SpO2:  [96 %] 96 % (10/17 0951) Weight:  [81.6 kg] 81.6 kg (10/17 0951)   Filed Weights   08/20/22 0951  Weight: 81.6 kg     Physical Exam     General:  weak/fatigued appearing. No respiratory difficulty HEENT: Normal Neck: Supple. JVD elevated. Carotids 2+ bilat; no bruits. No lymphadenopathy or thyromegaly appreciated. Cor: PMI nondisplaced. Regular rate & rhythm. No rubs, gallops or murmurs. Lungs: Clear Abdomen: Soft, nontender, nondistended. No hepatosplenomegaly. No bruits or masses. Good bowel sounds. Extremities: No  cyanosis, clubbing, rash, edema Neuro: Alert & oriented x 3, cranial nerves grossly intact. moves all 4 extremities w/o difficulty. Affect pleasant.   Telemetry   NSR 70s   EKG   NSR 65 bpm   Labs     Basic Metabolic Panel: Recent Labs  Lab 08/19/22 1058  NA 135  K 3.9  CL 97*  CO2 26  GLUCOSE 142*  BUN 24*  CREATININE 2.35*  CALCIUM 9.4    Liver Function Tests: Recent Labs  Lab 08/19/22 1058  AST 25  ALT 28  ALKPHOS 75  BILITOT 1.9*  PROT 7.6  ALBUMIN 3.9   No results for input(s): "LIPASE", "AMYLASE" in the last 168 hours. No results for input(s): "AMMONIA" in the last 168 hours.  CBC: Recent Labs  Lab 08/19/22 1058  WBC 5.2  HGB 10.5*  HCT 33.1*  MCV 83.4  PLT 187    Cardiac Enzymes: No results for input(s): "CKTOTAL", "CKMB", "CKMBINDEX", "TROPONINI" in the last 168 hours.  BNP: BNP (last 3 results) Recent Labs    04/15/22 0933 06/24/22 1010 08/19/22 1058  BNP 1,220.8* 573.1* 715.1*    ProBNP (last 3 results) Recent Labs    11/30/21 1301  PROBNP 943.0*     CBG: Recent Labs  Lab 08/20/22 1014  GLUCAP 124*    Coagulation Studies: No results for input(s): "LABPROT", "INR" in the last 72 hours.  Imaging: No results found.   Patient Profile   61 y/o male w/ end-stage biventricular heart failure due to NICM, undergoing w/u at [facility] for transplant, w/ recent progression to NYHA Class IV symptoms despite home milrinone, being admitted for escalation of support w/ 5.5 Impella for better HF optimization. Once stable will transfer to [facility] for transplant.   Assessment/Plan   1. Acute on Chronic Biventricular Heart Failure: - NICM, ? Noncompaction. Has ICD  - followed closely by the [facility] (Dr. Posey Pronto) - Echo 3/23 ([facility]) EF < 15%, RV moderately reduced  - CXP 3/23 ([facility]) pVO2 15.9 (55%0 slope 34 RER 1.37 - RHC (6/23): with elevated left sided filling pressure PCWP 29 and low CO.  - Echo  06/24/22 EF < 20% G3DD RV severely  decreased - NYHA IV on milrinone  Volume status up. Treated w/ dose of Furoscix. Plan RHC today  - Continue milrinone 0.25 mcg. Follow co-ox  - Continue torsemide 20 mg daily. May need IV Lasix pending RHC findings  - Continue Jardiance 10 mg daily  - Continue spiro 25 mg daily. - Continue digoxin 0.125 mg daily. Check level today - Off carvedilol due to low output.  - Off Entresto due to low BP.  - ? 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 Mexiletine switched to amiodarone for better PVC suppression. PVCs now suppressed > 4 weeks with no improvement in EF - Blood type O pos. - Case d/w Dr. Posey Pronto at [facility]. We agree that Mr. Harada is end-stage. Will admit today for Swan placement followed by Impella 5.5 for support. Once stable will transfer to [facility] for transplant.     2. PAF - Remains in NSR - Stop Eliquis with upcoming 5.5 - CBC today.    3. H/o LM Coronary Dissection  - Iatrogenic during diagnostic LHC in 2007.  - s/p CABG.  - No chest pain - If Scr ok can re-look cors    4. Pulmonary HTN - Echo RVSP 53 mmHg  - suspect combination WHO Groups 2&3 - RHC (6/23): RA 7, PA 54/28 (39), PCWP 29 (v 35), Fick CO/CI 3.4/1.6, PVR 2.9 WU, PAPi 3.7, PA sat 53% - + RV Failure - Sildenafil recently discontinued due to hypotension. Now on milrinone.   5. Severe TR - Likely functional in setting of dilated RA/RV. - Continue diuretics.   6. OSA - Compliant w/ CPAP      7. Type 2DM - last Hgb A1c 7.0 - Continue Jardiance. - on statin, followed by PCP    8. AKI on CKD, Stage IIIa - Baseline SCr ~1.6 Most recently 2.1 likely cardio-renal - on Jardiance. - BMET today.   9. PVCs/NSVT - mexiletine switched to amiodarone. No improvement in EF with PVC suppression  - Continue amio for now   [employer], PA-C 08/20/2022, 10:55 AM  Advanced Heart Failure Team Pager [phone] (M-F; 7a - 5p)  Please contact [facility] for night-coverage after hours (4p -7a ) and weekends on  [email or web address]  Agree with above.   Failing medical treatment for HF even with milrinone support (InterMacs 2). RHC today with markedly elevated filling pressures and reduced output despite milrinone  General:  Sitting up No resp difficulty HEENT: normal Neck: supple. JVP 7 Carotids 2+ bilat; no bruits. No lymphadenopathy or thryomegaly appreciated. Cor: PMI laterally displaced. Regular rate & rhythm. +s3 Lungs: clear Abdomen: soft, nontender, nondistended. No  hepatosplenomegaly. No bruits or masses. Good bowel sounds. Extremities: no cyanosis, clubbing, rash, 1+ edema Neuro: alert & orientedx3, cranial nerves grossly intact. moves all 4 extremities w/o difficulty. Affect pleasant  Leave swan in. Diurese and titrate milrinone as needed. Start heparin. Plan Impella 5.5 on Thursday with goal of transferring to [facility] for transplant. D/w [facility] Transplant team. Watch renal function.   CRITICAL CARE Performed by: Glori Bickers  Total critical care time: 45 minutes  Critical care time was exclusive of separately billable procedures and treating other patients.  Critical care was necessary to treat or prevent imminent or life-threatening deterioration.  Critical care was time spent personally by me (independent of midlevel providers or residents) on the following activities: development of treatment plan with patient and/or surrogate as well as nursing, discussions with consultants, evaluation of patient's response to treatment, examination of patient, obtaining history from patient or surrogate, ordering and performing treatments and interventions, ordering and review of laboratory studies, ordering and review of radiographic studies, pulse oximetry and re-evaluation of patient's condition.  Glori Bickers, MD  12:21 PM

## 2022-08-19 NOTE — Progress Notes (Signed)
Provided patient education on Furoscix using demo kits and Furoscix video, QR code provided on AVS for further viewing.  Sample kit for 1 use provided  Medication Samples have been provided to the patient.  Drug name: Furoscix       Strength: 80 mg        Qty: 1  LOT: [id number]  Exp.Date: 01/02/24  Dosing instructions: use 1 kit today  The patient has been instructed regarding the correct time, dose, and frequency of taking this medication, including desired effects and most common side effects.   Heather Schub 10:59 AM 08/19/2022   

## 2022-08-19 NOTE — Progress Notes (Signed)
ADVANCED HF CLINIC NOTE   Primary Care: Caren Macadam, MD (Inactive) Primary Cardiologist: Dr Minus Breeding Advanced Heart Failure ([facility], Dr. Posey Pronto) & Dr. Haroldine Laws EP: Dr. [facility]: Dr. Claiborne Billings     HPI: Bruce Robertson is a 61 y.o. 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    Followed by Dr. [name]. Also followed closely at [facility] and see's Dr. Posey Pronto. Per last clinic note, 3/23, he had cardiopulmonary stress testing that was reassuring.Peak VO2 near 16. Chronically NYHA Class II. Has been on good GDMT. Not felt to need advanced therapies at time of last assessment by Dr. Posey Pronto.    Echo at [facility] 3/23, EF < 15%, RV moderately reduced, mild MR, moderate TR. It's not clear if he has ever had a cMRI.    Admitted 6/23 w/ a/c CHF and volume overload. Hospitalization was c/b hypotension, requiring discontinuation of several HF medications. Lactate level was normal and renal function remained stable, . Echo EF < 20%, RV moderately reduced, mild MR and progression of TR to severe, GIIIDD and severe BAE. He was diuresed w/ IV Lasix and transitioned back to PO torsemide. Referred to [facility] clinic.    Seen in [facility] 6/23, NYHA IIIb-IV symptoms. Concern for low output and arranged for RHC.  Admitted from cath lab 6/23 after RHC showed elevated left sided filling pressures and low output. Started on milrinone and diuresed with IV lasix. GDMT titrated. Mexilitene switched to amiodarone for better PVC suppression. Discharged home with milrinone with [facility] follow up arranged, weight 186 lbs.  Has been undergoing transplant w/u at [facility]. Remains on milrinone. Echo 06/24/22 EF < 20% G3DD RV severely decreased Personally reviewed  Returns for f/u. Feels terrible. No energy. Unable to do ADLs. SOB and  fatigued with any activity. No problems with milrinone pump. Last Scr 2.1  ICD interrogation: No AF/VT. Optivol up      Cardiac Testing   - RHC (6/23):     RA = 7 RV = 48/16 PA = 54/28 (39) PCW = 29 (v = 35) Fick cardiac output/index = 3.4/1.6 PVR = 2.9 WU Ao sat = 95% PA sat = 53%, 53% PAPi = 3.7    CPX ([facility]) 01/2022 - pVO2 15.9 (55%) - slope 34   Past Medical History:  Diagnosis Date   AICD (automatic cardioverter/defibrillator) present    Anxiety 08/20/2016   Barrett's esophagus 2002   Cardiomyopathy    Nonischemic. EF has been about 45%. S/P CABG.;  b.  Echo 4/14: EF 25%, global HK with inf and mid apical AK, restrictive physiology with E/e' > 15 (elevated LV filling pressure), trivial AI/MR, mild to mod LAE, mild RVE, mild reduced RVSF, mild RAE, mod TR, PASP 74 (severe pulmonary HTN)    CHF (congestive heart failure) (HCC)    CHF exacerbation (HCC) 07/31/2016   Colon polyps 2002   Coronary artery disease    Diabetes mellitus without complication (HCC)    Type II   Dyslipidemia    hx (03/20/2017)   GERD (gastroesophageal reflux disease)    History of bleeding peptic ulcer    History of echocardiogram    Echo 10/16:  EF 25-30%, poss non-compaction, diff HK with inf-lat and apical HK, restrictive physio, mild AI, severe LAE, mild RVE with  mild reduced RVSF, PASP 42 mmHg   Hyperplastic rectal polyp    Moderate episode of recurrent major depressive disorder ([place]) 08/20/2016   09/14/20 - not current   OSA on CPAP    Mild   Pneumonia 09/2018   Tobacco abuse    Remote    Current Outpatient Medications  Medication Sig Dispense Refill   ACCU-CHEK GUIDE test strip Use as directed-patient needs an appt 30 each 0   Accu-Chek Softclix Lancets lancets Use as directed 100 each 0   albuterol (VENTOLIN HFA) 108 (90 Base) MCG/ACT inhaler Inhale 2 puffs into the lungs every 6 (six) hours as needed for wheezing or shortness of breath. 8 g 1   amiodarone (PACERONE) 200 MG  tablet Take 1 tablet (200 mg total) by mouth daily. 30 tablet 6   atorvastatin (LIPITOR) 40 MG tablet Take 1 tablet (40 mg total) by mouth daily. 30 tablet 6   blood glucose meter kit and supplies KIT Dispense based on patient and insurance preference. Use up to four times daily as directed. 1 each 0   Budeson-Glycopyrrol-Formoterol (BREZTRI AEROSPHERE) 160-9-4.8 MCG/ACT AERO Inhale 2 puffs into the lungs 2 (two) times daily. 32.1 g 3   digoxin (LANOXIN) 0.125 MG tablet Take 1 tablet (0.125 mg total) by mouth daily. 30 tablet 6   ELIQUIS 5 MG TABS tablet Take 1 tablet by mouth twice daily 180 tablet 3   JARDIANCE 10 MG TABS tablet Take 1 tablet (10 mg total) by mouth daily. 30 tablet 0   milrinone (PRIMACOR) 20 MG/100 ML SOLN infusion Inject 0.0212 mg/min into the vein continuous. 250 mL 32   omeprazole (PRILOSEC) 40 MG capsule Take 1 capsule (40 mg total) by mouth daily. 30 capsule 6   Spacer/Aero-Holding Chambers (AEROCHAMBER PLUS) inhaler Use as instructed 1 each 0   spironolactone (ALDACTONE) 25 MG tablet Take 25 mg by mouth daily.     torsemide (DEMADEX) 20 MG tablet Take 2 tablets (40 mg total) by mouth daily. 60 tablet 3   No current facility-administered medications for this encounter.   No Known Allergies  Social History   Socioeconomic History   Marital status: Significant Other    Spouse name: Not on file   Number of children: 5   Years of education: Not on file   Highest education level: High school graduate  Occupational History   Occupation: Civil engineer, contracting    Employer: [employer]    Comment: Disability 4 years  Tobacco Use   Smoking status: Former    Packs/day: 0.25    Years: 7.00    Total pack years: 1.75    Types: Cigarettes   Smokeless tobacco: Never   Tobacco comments:    03/20/2017 "quit in [place] Use   Vaping Use: Former   Substances: [place]: vaped for about a month  Substance and Sexual Activity   Alcohol use: Not Currently    Comment:  socially   Drug use: Yes    Frequency: 1.0 times per week    Types: Marijuana   Sexual activity: Yes  Other Topics Concern   Not on file  Social History Narrative   Single   Social Determinants of Health   Financial Resource Strain: Low Risk  (01/29/2022)   Overall Financial Resource Strain (CARDIA)    Difficulty of Paying Living Expenses: Not very hard  Food Insecurity: No Food Insecurity (04/17/2022)   Hunger Vital Sign    Worried About Running Out of Food  in the Last Year: Never true    [place] in the Last Year: Never true  Transportation Needs: No Transportation Needs (06/06/2022)   PRAPARE - Hydrologist (Medical): No    Lack of Transportation (Non-Medical): No  Physical Activity: Inactive (11/20/2021)   Exercise Vital Sign    Days of Exercise per Week: 0 days    Minutes of Exercise per Session: 0 min  Stress: No Stress Concern Present (11/20/2021)   [place]    Feeling of Stress : Only a little  Social Connections: Moderately Isolated (11/14/2020)   Social Connection and Isolation Panel [NHANES]    Frequency of Communication with Friends and Family: More than three times a week    Frequency of Social Gatherings with Friends and Family: Never    Attends Religious Services: More than 4 times per year    Active Member of [employer] or Organizations: No    Attends Archivist Meetings: Never    Marital Status: Divorced  Human resources officer Violence: Not At Risk (06/06/2022)   Humiliation, Afraid, Rape, and Kick questionnaire    Fear of Current or Ex-Partner: No    Emotionally Abused: No    Physically Abused: No    Sexually Abused: No    Family History  Problem Relation Age of Onset   Hypertension Mother    Osteoarthritis Mother    Heart failure Father 41   High blood pressure Father    Bipolar disorder Daughter    Diabetes Daughter    Cancer Maternal Uncle         uncertain type   Cancer Maternal Grandmother        uncertain type   Diabetes Niece    Lung cancer Other    Asthma Neg Hx    Breast cancer Neg Hx    BP 104/60   Pulse 60   Wt 82.6 kg (182 lb)   SpO2 100%   BMI 24.68 kg/m   Wt Readings from Last 3 Encounters:  08/19/22 82.6 kg (182 lb)  06/24/22 85.9 kg (189 lb 6.4 oz)  05/10/22 87.7 kg (193 lb 6.4 oz)   PHYSICAL EXAM: General:  Weak appearing. No resp difficulty HEENT: normal Neck: supple. JVP 7-8 Carotids 2+ bilat; no bruits. No lymphadenopathy or thryomegaly appreciated. Cor: PMI laterally displaced. Regular rate & rhythm. No rubs, gallops or murmurs. PICC Ok Lungs: clear Abdomen: soft, nontender, nondistended. No hepatosplenomegaly. No bruits or masses. Good bowel sounds. Extremities: no cyanosis, clubbing, rash, edema Neuro: alert & orientedx3, cranial nerves grossly intact. moves all 4 extremities w/o difficulty. Affect pleasant    ASSESSMENT & PLAN: 1. Chronic Biventricular Heart Failure: - NICM, ? Noncompaction. Has ICD  - followed closely by the [facility] (Dr. Posey Pronto) - Echo 3/23 ([facility]) EF < 15%, RV moderately reduced  - CXP 3/23 ([facility]) pVO2 15.9 (55%0 slope 34 RER 1.37 - RHC (6/23): with elevated left sided filling pressure PCWP 29 and low CO.  - Echo  06/24/22 EF < 20% G3DD RV severely decreased - NYHA IV on milrinone  Volume status up  - Continue milrinone 0.25 mcg.  - Continue torsemide 20 mg daily. - Continue Jardiance 10 mg daily  - Continue spiro 25 mg daily. - Continue digoxin 0.125 mg daily. Check level today - Off carvedilol due to low output.  - Off Entresto due to low BP.  - ? 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 Mexiletine  switched to amiodarone for better PVC suppression. PVCs now suppressed > 4 weeks with no improvement in EF - Blood type O pos. - Case d/w Dr. Posey Pronto at [facility]. 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 Will admit tomorrow for [facility] placement followed by Impella 5.5 for support. Once stable  will transfer to [facility] for transplant.  - Will give Furoscix for volume overload  2. PAF - Remains in NSR -  Stop Eliquis with upcoming 5.5 - CBC today.    3. H/o LM Coronary Dissection  - Iatrogenic during diagnostic LHC in 2007.  - s/p CABG.  - No chest pain - If Scr ok tomorrow can re-look cors    4. Pulmonary HTN - Echo RVSP 53 mmHg  - suspect combination WHO Groups 2&3 - RHC (6/23): RA 7, PA 54/28 (39), PCWP 29 (v 35), Fick CO/CI 3.4/1.6, PVR 2.9 WU, PAPi 3.7, PA sat 53% - + RV Failure - Sildenafil recently discontinued due to hypotension. Now on milrinone.   5. Severe TR - Likely functional in setting of dilated RA/RV. - Continue diuretics.   6. OSA - Compliant w/ CPAP     7. Type 2DM - last Hgb A1c 7.0 - Continue Jardiance. - on statin, followed by PCP    8. AKI on CKD, Stage IIIa - Baseline SCr ~1.6 Most recently 2.1 likely cardio-renal - on Jardiance. - BMET today.   9. PVCs/NSVT - mexiletine switched to amiodarone. No improvement in EF with PVC suppression  - Continue amio for now  Total time spent 45 minutes. Over half that time spent discussing above.   Glori Bickers, MD  10:37 AM

## 2022-08-19 NOTE — Telephone Encounter (Signed)
Spoke with patient told him to hold Eliquis today, not to eat or drink after midnight, hold Torsemide and Spironolactone in the morning. If he hasn't heard from bed placement with time to be admitted, he needs to be at the hospital by 11am in the morning

## 2022-08-19 NOTE — Patient Instructions (Signed)
Your provider has order Furoscix for you. This is an on-body infuser that gives you a dose of Furosemide.   We have provided you with 1 sample kit to use TODAY   Labs done today, your results will be available in MyChart, we will contact you for abnormal readings.   You are schedule for admission to hospital tomorrow (08/20/22), they will call you when the room is ready, please arrive to [place] inside Main Entrance A once you have been called  Do the following things EVERYDAY: Weigh yourself in the morning before breakfast. Write it down and keep it in a log. Take your medicines as prescribed Eat low salt foods--Limit salt (sodium) to 2000 mg per day.  Stay as active as you can everyday Limit all fluids for the day to less than 2 liters  If you have any questions or concerns before your next appointment please send [place] a message through [place] or call our office at [phone].    TO LEAVE A MESSAGE FOR THE NURSE SELECT OPTION 2, PLEASE LEAVE A MESSAGE INCLUDING: YOUR NAME DATE OF BIRTH CALL BACK NUMBER REASON FOR CALL**this is important as we prioritize the call backs  YOU WILL RECEIVE A CALL BACK THE SAME DAY AS LONG AS YOU CALL BEFORE 4:00 PM

## 2022-08-19 NOTE — H&P (View-Only) (Signed)
ADVANCED HF CLINIC NOTE   Primary Care: Caren Macadam, MD (Inactive) Primary Cardiologist: Dr Minus Breeding Advanced Heart Failure ([facility], Dr. Posey Pronto) & Dr. Haroldine Laws EP: Dr. [facility]: Dr. Claiborne Billings     HPI: Aurthur Wingerter is a 61 y.o. 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    Followed by Dr. [name]. Also followed closely at [facility] and see's Dr. Posey Pronto. Per last clinic note, 3/23, he had cardiopulmonary stress testing that was reassuring.Peak VO2 near 16. Chronically NYHA Class II. Has been on good GDMT. Not felt to need advanced therapies at time of last assessment by Dr. Posey Pronto.    Echo at [facility] 3/23, EF < 15%, RV moderately reduced, mild MR, moderate TR. It's not clear if he has ever had a cMRI.    Admitted 6/23 w/ a/c CHF and volume overload. Hospitalization was c/b hypotension, requiring discontinuation of several HF medications. Lactate level was normal and renal function remained stable, . Echo EF < 20%, RV moderately reduced, mild MR and progression of TR to severe, GIIIDD and severe BAE. He was diuresed w/ IV Lasix and transitioned back to PO torsemide. Referred to [facility] clinic.    Seen in [facility] 6/23, NYHA IIIb-IV symptoms. Concern for low output and arranged for RHC.  Admitted from cath lab 6/23 after RHC showed elevated left sided filling pressures and low output. Started on milrinone and diuresed with IV lasix. GDMT titrated. Mexilitene switched to amiodarone for better PVC suppression. Discharged home with milrinone with [facility] follow up arranged, weight 186 lbs.  Has been undergoing transplant w/u at [facility]. Remains on milrinone. Echo 06/24/22 EF < 20% G3DD RV severely decreased Personally reviewed  Returns for f/u. Feels terrible. No energy. Unable to do ADLs. SOB and  fatigued with any activity. No problems with milrinone pump. Last Scr 2.1  ICD interrogation: No AF/VT. Optivol up      Cardiac Testing   - RHC (6/23):     RA = 7 RV = 48/16 PA = 54/28 (39) PCW = 29 (v = 35) Fick cardiac output/index = 3.4/1.6 PVR = 2.9 WU Ao sat = 95% PA sat = 53%, 53% PAPi = 3.7    CPX ([facility]) 01/2022 - pVO2 15.9 (55%) - slope 34   Past Medical History:  Diagnosis Date   AICD (automatic cardioverter/defibrillator) present    Anxiety 08/20/2016   Barrett's esophagus 2002   Cardiomyopathy    Nonischemic. EF has been about 45%. S/P CABG.;  b.  Echo 4/14: EF 25%, global HK with inf and mid apical AK, restrictive physiology with E/e' > 15 (elevated LV filling pressure), trivial AI/MR, mild to mod LAE, mild RVE, mild reduced RVSF, mild RAE, mod TR, PASP 74 (severe pulmonary HTN)    CHF (congestive heart failure) (HCC)    CHF exacerbation (HCC) 07/31/2016   Colon polyps 2002   Coronary artery disease    Diabetes mellitus without complication (HCC)    Type II   Dyslipidemia    hx (03/20/2017)   GERD (gastroesophageal reflux disease)    History of bleeding peptic ulcer    History of echocardiogram    Echo 10/16:  EF 25-30%, poss non-compaction, diff HK with inf-lat and apical HK, restrictive physio, mild AI, severe LAE, mild RVE with  mild reduced RVSF, PASP 42 mmHg   Hyperplastic rectal polyp    Moderate episode of recurrent major depressive disorder ([place]) 08/20/2016   09/14/20 - not current   OSA on CPAP    Mild   Pneumonia 09/2018   Tobacco abuse    Remote    Current Outpatient Medications  Medication Sig Dispense Refill   ACCU-CHEK GUIDE test strip Use as directed-patient needs an appt 30 each 0   Accu-Chek Softclix Lancets lancets Use as directed 100 each 0   albuterol (VENTOLIN HFA) 108 (90 Base) MCG/ACT inhaler Inhale 2 puffs into the lungs every 6 (six) hours as needed for wheezing or shortness of breath. 8 g 1   amiodarone (PACERONE) 200 MG  tablet Take 1 tablet (200 mg total) by mouth daily. 30 tablet 6   atorvastatin (LIPITOR) 40 MG tablet Take 1 tablet (40 mg total) by mouth daily. 30 tablet 6   blood glucose meter kit and supplies KIT Dispense based on patient and insurance preference. Use up to four times daily as directed. 1 each 0   Budeson-Glycopyrrol-Formoterol (BREZTRI AEROSPHERE) 160-9-4.8 MCG/ACT AERO Inhale 2 puffs into the lungs 2 (two) times daily. 32.1 g 3   digoxin (LANOXIN) 0.125 MG tablet Take 1 tablet (0.125 mg total) by mouth daily. 30 tablet 6   ELIQUIS 5 MG TABS tablet Take 1 tablet by mouth twice daily 180 tablet 3   JARDIANCE 10 MG TABS tablet Take 1 tablet (10 mg total) by mouth daily. 30 tablet 0   milrinone (PRIMACOR) 20 MG/100 ML SOLN infusion Inject 0.0212 mg/min into the vein continuous. 250 mL 32   omeprazole (PRILOSEC) 40 MG capsule Take 1 capsule (40 mg total) by mouth daily. 30 capsule 6   Spacer/Aero-Holding Chambers (AEROCHAMBER PLUS) inhaler Use as instructed 1 each 0   spironolactone (ALDACTONE) 25 MG tablet Take 25 mg by mouth daily.     torsemide (DEMADEX) 20 MG tablet Take 2 tablets (40 mg total) by mouth daily. 60 tablet 3   No current facility-administered medications for this encounter.   No Known Allergies  Social History   Socioeconomic History   Marital status: Significant Other    Spouse name: Not on file   Number of children: 5   Years of education: Not on file   Highest education level: High school graduate  Occupational History   Occupation: Civil engineer, contracting    Employer: [employer]    Comment: Disability 4 years  Tobacco Use   Smoking status: Former    Packs/day: 0.25    Years: 7.00    Total pack years: 1.75    Types: Cigarettes   Smokeless tobacco: Never   Tobacco comments:    03/20/2017 "quit in [place] Use   Vaping Use: Former   Substances: [place]: vaped for about a month  Substance and Sexual Activity   Alcohol use: Not Currently    Comment:  socially   Drug use: Yes    Frequency: 1.0 times per week    Types: Marijuana   Sexual activity: Yes  Other Topics Concern   Not on file  Social History Narrative   Single   Social Determinants of Health   Financial Resource Strain: Low Risk  (01/29/2022)   Overall Financial Resource Strain (CARDIA)    Difficulty of Paying Living Expenses: Not very hard  Food Insecurity: No Food Insecurity (04/17/2022)   Hunger Vital Sign    Worried About Running Out of Food  in the Last Year: Never true    [place] in the Last Year: Never true  Transportation Needs: No Transportation Needs (06/06/2022)   PRAPARE - Hydrologist (Medical): No    Lack of Transportation (Non-Medical): No  Physical Activity: Inactive (11/20/2021)   Exercise Vital Sign    Days of Exercise per Week: 0 days    Minutes of Exercise per Session: 0 min  Stress: No Stress Concern Present (11/20/2021)   [place]    Feeling of Stress : Only a little  Social Connections: Moderately Isolated (11/14/2020)   Social Connection and Isolation Panel [NHANES]    Frequency of Communication with Friends and Family: More than three times a week    Frequency of Social Gatherings with Friends and Family: Never    Attends Religious Services: More than 4 times per year    Active Member of [employer] or Organizations: No    Attends Archivist Meetings: Never    Marital Status: Divorced  Human resources officer Violence: Not At Risk (06/06/2022)   Humiliation, Afraid, Rape, and Kick questionnaire    Fear of Current or Ex-Partner: No    Emotionally Abused: No    Physically Abused: No    Sexually Abused: No    Family History  Problem Relation Age of Onset   Hypertension Mother    Osteoarthritis Mother    Heart failure Father 40   High blood pressure Father    Bipolar disorder Daughter    Diabetes Daughter    Cancer Maternal Uncle         uncertain type   Cancer Maternal Grandmother        uncertain type   Diabetes Niece    Lung cancer Other    Asthma Neg Hx    Breast cancer Neg Hx    BP 104/60   Pulse 60   Wt 82.6 kg (182 lb)   SpO2 100%   BMI 24.68 kg/m   Wt Readings from Last 3 Encounters:  08/19/22 82.6 kg (182 lb)  06/24/22 85.9 kg (189 lb 6.4 oz)  05/10/22 87.7 kg (193 lb 6.4 oz)   PHYSICAL EXAM: General:  Weak appearing. No resp difficulty HEENT: normal Neck: supple. JVP 7-8 Carotids 2+ bilat; no bruits. No lymphadenopathy or thryomegaly appreciated. Cor: PMI laterally displaced. Regular rate & rhythm. No rubs, gallops or murmurs. PICC Ok Lungs: clear Abdomen: soft, nontender, nondistended. No hepatosplenomegaly. No bruits or masses. Good bowel sounds. Extremities: no cyanosis, clubbing, rash, edema Neuro: alert & orientedx3, cranial nerves grossly intact. moves all 4 extremities w/o difficulty. Affect pleasant    ASSESSMENT & PLAN: 1. Chronic Biventricular Heart Failure: - NICM, ? Noncompaction. Has ICD  - followed closely by the [facility] (Dr. Posey Pronto) - Echo 3/23 ([facility]) EF < 15%, RV moderately reduced  - CXP 3/23 ([facility]) pVO2 15.9 (55%0 slope 34 RER 1.37 - RHC (6/23): with elevated left sided filling pressure PCWP 29 and low CO.  - Echo  06/24/22 EF < 20% G3DD RV severely decreased - NYHA IV on milrinone  Volume status up  - Continue milrinone 0.25 mcg.  - Continue torsemide 20 mg daily. - Continue Jardiance 10 mg daily  - Continue spiro 25 mg daily. - Continue digoxin 0.125 mg daily. Check level today - Off carvedilol due to low output.  - Off Entresto due to low BP.  - ? 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 Mexiletine  switched to amiodarone for better PVC suppression. PVCs now suppressed > 4 weeks with no improvement in EF - Blood type O pos. - Case d/w Dr. Posey Pronto at [facility]. We agree that Mr. Avina is end-stage. Will admit tomorrow for [facility] placement followed by Impella 5.5 for support. Once stable  will transfer to [facility] for transplant.  - Will give Furoscix for volume overload  2. PAF - Remains in NSR -  Stop Eliquis with upcoming 5.5 - CBC today.    3. H/o LM Coronary Dissection  - Iatrogenic during diagnostic LHC in 2007.  - s/p CABG.  - No chest pain - If Scr ok tomorrow can re-look cors    4. Pulmonary HTN - Echo RVSP 53 mmHg  - suspect combination WHO Groups 2&3 - RHC (6/23): RA 7, PA 54/28 (39), PCWP 29 (v 35), Fick CO/CI 3.4/1.6, PVR 2.9 WU, PAPi 3.7, PA sat 53% - + RV Failure - Sildenafil recently discontinued due to hypotension. Now on milrinone.   5. Severe TR - Likely functional in setting of dilated RA/RV. - Continue diuretics.   6. OSA - Compliant w/ CPAP     7. Type 2DM - last Hgb A1c 7.0 - Continue Jardiance. - on statin, followed by PCP    8. AKI on CKD, Stage IIIa - Baseline SCr ~1.6 Most recently 2.1 likely cardio-renal - on Jardiance. - BMET today.   9. PVCs/NSVT - mexiletine switched to amiodarone. No improvement in EF with PVC suppression  - Continue amio for now  Total time spent 45 minutes. Over half that time spent discussing above.   Glori Bickers, MD  10:37 AM

## 2022-08-20 ENCOUNTER — Encounter (HOSPITAL_COMMUNITY)
Admission: RE | Disposition: A | Discharge status: Other Acute Inpatient Hospital | Payer: Self-pay | Source: Ambulatory Visit | Attending: Internal Medicine

## 2022-08-20 ENCOUNTER — Other Ambulatory Visit: Payer: Self-pay

## 2022-08-20 ENCOUNTER — Encounter (HOSPITAL_COMMUNITY): Payer: Self-pay | Admitting: Internal Medicine

## 2022-08-20 ENCOUNTER — Inpatient Hospital Stay (HOSPITAL_COMMUNITY)
Admission: RE | Admit: 2022-08-20 | Discharge: 2022-08-23 | DRG: 002 | Disposition: A | Discharge status: Other Acute Inpatient Hospital | Payer: Medicare Other | Attending: Internal Medicine | Admitting: Internal Medicine

## 2022-08-20 ENCOUNTER — Inpatient Hospital Stay (HOSPITAL_COMMUNITY): Payer: Medicare Other

## 2022-08-20 DIAGNOSIS — I480 Paroxysmal atrial fibrillation: Secondary | ICD-10-CM | POA: Diagnosis present

## 2022-08-20 DIAGNOSIS — Z7984 Long term (current) use of oral hypoglycemic drugs: Secondary | ICD-10-CM | POA: Diagnosis not present

## 2022-08-20 DIAGNOSIS — Z8711 Personal history of peptic ulcer disease: Secondary | ICD-10-CM

## 2022-08-20 DIAGNOSIS — I088 Other rheumatic multiple valve diseases: Secondary | ICD-10-CM | POA: Diagnosis not present

## 2022-08-20 DIAGNOSIS — I272 Pulmonary hypertension, unspecified: Secondary | ICD-10-CM | POA: Diagnosis not present

## 2022-08-20 DIAGNOSIS — Z7901 Long term (current) use of anticoagulants: Secondary | ICD-10-CM | POA: Diagnosis not present

## 2022-08-20 DIAGNOSIS — F419 Anxiety disorder, unspecified: Secondary | ICD-10-CM | POA: Diagnosis present

## 2022-08-20 DIAGNOSIS — E1122 Type 2 diabetes mellitus with diabetic chronic kidney disease: Secondary | ICD-10-CM | POA: Diagnosis present

## 2022-08-20 DIAGNOSIS — Z8719 Personal history of other diseases of the digestive system: Secondary | ICD-10-CM

## 2022-08-20 DIAGNOSIS — Z801 Family history of malignant neoplasm of trachea, bronchus and lung: Secondary | ICD-10-CM

## 2022-08-20 DIAGNOSIS — Z79899 Other long term (current) drug therapy: Secondary | ICD-10-CM | POA: Diagnosis not present

## 2022-08-20 DIAGNOSIS — N179 Acute kidney failure, unspecified: Secondary | ICD-10-CM | POA: Diagnosis not present

## 2022-08-20 DIAGNOSIS — R001 Bradycardia, unspecified: Secondary | ICD-10-CM | POA: Diagnosis not present

## 2022-08-20 DIAGNOSIS — Z833 Family history of diabetes mellitus: Secondary | ICD-10-CM

## 2022-08-20 DIAGNOSIS — I472 Ventricular tachycardia, unspecified: Secondary | ICD-10-CM | POA: Diagnosis present

## 2022-08-20 DIAGNOSIS — I2510 Atherosclerotic heart disease of native coronary artery without angina pectoris: Secondary | ICD-10-CM | POA: Diagnosis present

## 2022-08-20 DIAGNOSIS — I071 Rheumatic tricuspid insufficiency: Secondary | ICD-10-CM | POA: Diagnosis present

## 2022-08-20 DIAGNOSIS — G4733 Obstructive sleep apnea (adult) (pediatric): Secondary | ICD-10-CM | POA: Diagnosis present

## 2022-08-20 DIAGNOSIS — Z95810 Presence of automatic (implantable) cardiac defibrillator: Secondary | ICD-10-CM

## 2022-08-20 DIAGNOSIS — I517 Cardiomegaly: Secondary | ICD-10-CM | POA: Diagnosis not present

## 2022-08-20 DIAGNOSIS — I5023 Acute on chronic systolic (congestive) heart failure: Secondary | ICD-10-CM | POA: Diagnosis present

## 2022-08-20 DIAGNOSIS — J811 Chronic pulmonary edema: Secondary | ICD-10-CM | POA: Diagnosis not present

## 2022-08-20 DIAGNOSIS — N1831 Chronic kidney disease, stage 3a: Secondary | ICD-10-CM | POA: Diagnosis present

## 2022-08-20 DIAGNOSIS — E785 Hyperlipidemia, unspecified: Secondary | ICD-10-CM | POA: Diagnosis present

## 2022-08-20 DIAGNOSIS — K219 Gastro-esophageal reflux disease without esophagitis: Secondary | ICD-10-CM | POA: Diagnosis present

## 2022-08-20 DIAGNOSIS — G4700 Insomnia, unspecified: Secondary | ICD-10-CM | POA: Diagnosis present

## 2022-08-20 DIAGNOSIS — I255 Ischemic cardiomyopathy: Secondary | ICD-10-CM | POA: Diagnosis not present

## 2022-08-20 DIAGNOSIS — R110 Nausea: Secondary | ICD-10-CM | POA: Diagnosis not present

## 2022-08-20 DIAGNOSIS — I5082 Biventricular heart failure: Principal | ICD-10-CM | POA: Diagnosis present

## 2022-08-20 DIAGNOSIS — Z95811 Presence of heart assist device: Secondary | ICD-10-CM | POA: Diagnosis not present

## 2022-08-20 DIAGNOSIS — I351 Nonrheumatic aortic (valve) insufficiency: Secondary | ICD-10-CM | POA: Diagnosis present

## 2022-08-20 DIAGNOSIS — Z951 Presence of aortocoronary bypass graft: Secondary | ICD-10-CM

## 2022-08-20 DIAGNOSIS — I428 Other cardiomyopathies: Secondary | ICD-10-CM | POA: Diagnosis present

## 2022-08-20 DIAGNOSIS — I509 Heart failure, unspecified: Secondary | ICD-10-CM | POA: Diagnosis not present

## 2022-08-20 DIAGNOSIS — Z87891 Personal history of nicotine dependence: Secondary | ICD-10-CM | POA: Diagnosis not present

## 2022-08-20 DIAGNOSIS — Z8249 Family history of ischemic heart disease and other diseases of the circulatory system: Secondary | ICD-10-CM

## 2022-08-20 DIAGNOSIS — I110 Hypertensive heart disease with heart failure: Secondary | ICD-10-CM | POA: Diagnosis not present

## 2022-08-20 HISTORY — PX: RIGHT HEART CATH: CATH118263

## 2022-08-20 LAB — APTT: aPTT: 47 seconds — ABNORMAL HIGH (ref 24–36)

## 2022-08-20 LAB — BASIC METABOLIC PANEL
Anion gap: 13 (ref 5–15)
Anion gap: 12 (ref 5–15)
BUN: 24 mg/dL — ABNORMAL HIGH (ref 6–20)
BUN: 22 mg/dL — ABNORMAL HIGH (ref 6–20)
CO2: 25 mmol/L (ref 22–32)
CO2: 26 mmol/L (ref 22–32)
Calcium: 9.5 mg/dL (ref 8.9–10.3)
Calcium: 9.3 mg/dL (ref 8.9–10.3)
Chloride: 98 mmol/L (ref 98–111)
Chloride: 98 mmol/L (ref 98–111)
Creatinine, Ser: 1.95 mg/dL — ABNORMAL HIGH (ref 0.61–1.24)
Creatinine, Ser: 1.83 mg/dL — ABNORMAL HIGH (ref 0.61–1.24)
GFR, Estimated: 39 mL/min — ABNORMAL LOW (ref >60)
GFR, Estimated: 42 mL/min — ABNORMAL LOW (ref >60)
Glucose, Bld: 117 mg/dL — ABNORMAL HIGH (ref 70–99)
Glucose, Bld: 92 mg/dL (ref 70–99)
Potassium: 3.9 mmol/L (ref 3.5–5.1)
Potassium: 4.1 mmol/L (ref 3.5–5.1)
Sodium: 136 mmol/L (ref 135–145)
Sodium: 136 mmol/L (ref 135–145)

## 2022-08-20 LAB — MRSA NEXT GEN BY PCR, NASAL: MRSA by PCR Next Gen: NOT DETECTED

## 2022-08-20 LAB — POCT I-STAT EG7
Acid-Base Excess: 0 mmol/L (ref 0.0–2.0)
Acid-Base Excess: 4 mmol/L — ABNORMAL HIGH (ref 0.0–2.0)
Bicarbonate: 24.6 mmol/L (ref 20.0–28.0)
Bicarbonate: 28 mmol/L (ref 20.0–28.0)
Calcium, Ion: 0.97 mmol/L — ABNORMAL LOW (ref 1.15–1.40)
Calcium, Ion: 1.13 mmol/L — ABNORMAL LOW (ref 1.15–1.40)
HCT: 29 % — ABNORMAL LOW (ref 39.0–52.0)
HCT: 32 % — ABNORMAL LOW (ref 39.0–52.0)
Hemoglobin: 10.9 g/dL — ABNORMAL LOW (ref 13.0–17.0)
Hemoglobin: 9.9 g/dL — ABNORMAL LOW (ref 13.0–17.0)
O2 Saturation: 54 %
O2 Saturation: 55 %
Potassium: 3.4 mmol/L — ABNORMAL LOW (ref 3.5–5.1)
Potassium: 3.9 mmol/L (ref 3.5–5.1)
Sodium: 136 mmol/L (ref 135–145)
Sodium: 140 mmol/L (ref 135–145)
TCO2: 26 mmol/L (ref 22–32)
TCO2: 29 mmol/L (ref 22–32)
pCO2, Ven: 37.3 mmHg — ABNORMAL LOW (ref 44–60)
pCO2, Ven: 41.1 mmHg — ABNORMAL LOW (ref 44–60)
pH, Ven: 7.428 (ref 7.25–7.43)
pH, Ven: 7.441 — ABNORMAL HIGH (ref 7.25–7.43)
pO2, Ven: 28 mmHg — CL (ref 32–45)
pO2, Ven: 28 mmHg — CL (ref 32–45)

## 2022-08-20 LAB — COOXEMETRY PANEL
Carboxyhemoglobin: 2.3 % — ABNORMAL HIGH (ref 0.5–1.5)
Methemoglobin: <0.7 % (ref 0.0–1.5)
O2 Saturation: 50.3 %
Total hemoglobin: 10.3 g/dL — ABNORMAL LOW (ref 12.0–16.0)

## 2022-08-20 LAB — HEPARIN LEVEL (UNFRACTIONATED): Heparin Unfractionated: 0.51 IU/mL (ref 0.30–0.70)

## 2022-08-20 LAB — CBC
HCT: 33.4 % — ABNORMAL LOW (ref 39.0–52.0)
Hemoglobin: 10.2 g/dL — ABNORMAL LOW (ref 13.0–17.0)
MCH: 25.6 pg — ABNORMAL LOW (ref 26.0–34.0)
MCHC: 30.5 g/dL (ref 30.0–36.0)
MCV: 83.9 fL (ref 80.0–100.0)
Platelets: 195 10*3/uL (ref 150–400)
RBC: 3.98 MIL/uL — ABNORMAL LOW (ref 4.22–5.81)
RDW: 16.5 % — ABNORMAL HIGH (ref 11.5–15.5)
WBC: 4.8 10*3/uL (ref 4.0–10.5)
nRBC: 0 % (ref 0.0–0.2)

## 2022-08-20 LAB — DIGOXIN LEVEL: Digoxin Level: 1.4 ng/mL (ref 0.8–2.0)

## 2022-08-20 LAB — GLUCOSE, CAPILLARY: Glucose-Capillary: 124 mg/dL — ABNORMAL HIGH (ref 70–99)

## 2022-08-20 SURGERY — RIGHT HEART CATH
Anesthesia: LOCAL

## 2022-08-20 MED ORDER — MIDAZOLAM HCL 2 MG/2ML IJ SOLN
INTRAMUSCULAR | Status: DC | PRN
  Administered 2022-08-20: 1 mg via INTRAVENOUS

## 2022-08-20 MED ORDER — FUROSEMIDE 10 MG/ML IJ SOLN
80.0000 mg | Freq: Once | INTRAMUSCULAR | Status: AC
  Administered 2022-08-20: 80 mg via INTRAVENOUS
  Filled 2022-08-20: qty 8

## 2022-08-20 MED ORDER — SODIUM CHLORIDE 0.9% IV SOLUTION: INTRAVENOUS | Status: DC

## 2022-08-20 MED ORDER — FENTANYL CITRATE (PF) 100 MCG/2ML IJ SOLN
INTRAMUSCULAR | Status: DC | PRN
  Administered 2022-08-20: 25 ug via INTRAVENOUS

## 2022-08-20 MED ORDER — MILRINONE LACTATE IN DEXTROSE 20-5 MG/100ML-% IV SOLN: 0.2500 ug/kg/min | INTRAVENOUS | Status: DC

## 2022-08-20 MED ORDER — SODIUM CHLORIDE 0.9% FLUSH
3.0000 mL | Freq: Two times a day (BID) | INTRAVENOUS | Status: DC
Start: 2022-08-20 — End: 2022-08-24
  Administered 2022-08-20 – 2022-08-21 (×2): 3 mL via INTRAVENOUS

## 2022-08-20 MED ORDER — ATORVASTATIN CALCIUM 40 MG PO TABS
40.0000 mg | ORAL_TABLET | Freq: Every day | ORAL | Status: DC
  Administered 2022-08-20 – 2022-08-23 (×3): 40 mg via ORAL
  Filled 2022-08-20 (×3): qty 1

## 2022-08-20 MED ORDER — LIDOCAINE HCL (PF) 1 % IJ SOLN
INTRAMUSCULAR | Status: DC | PRN
  Administered 2022-08-20: 8 mL

## 2022-08-20 MED ORDER — CHLORHEXIDINE GLUCONATE CLOTH 2 % EX PADS
6.0000 | MEDICATED_PAD | Freq: Every day | CUTANEOUS | Status: DC
  Administered 2022-08-20 – 2022-08-21 (×2): 6 via TOPICAL

## 2022-08-20 MED ORDER — ALBUTEROL SULFATE HFA 108 (90 BASE) MCG/ACT IN AERS: 2.0000 | INHALATION_SPRAY | Freq: Four times a day (QID) | RESPIRATORY_TRACT | Status: DC | PRN

## 2022-08-20 MED ORDER — ALBUTEROL SULFATE (2.5 MG/3ML) 0.083% IN NEBU: 2.5000 mg | INHALATION_SOLUTION | Freq: Four times a day (QID) | RESPIRATORY_TRACT | Status: DC | PRN

## 2022-08-20 MED ORDER — SODIUM CHLORIDE 0.9 % IV SOLN: INTRAVENOUS | Status: DC

## 2022-08-20 MED ORDER — POTASSIUM CHLORIDE CRYS ER 20 MEQ PO TBCR
20.0000 meq | EXTENDED_RELEASE_TABLET | Freq: Once | ORAL | Status: AC
  Administered 2022-08-20: 20 meq via ORAL
  Filled 2022-08-20: qty 1

## 2022-08-20 MED ORDER — SODIUM CHLORIDE 0.9% FLUSH
3.0000 mL | Freq: Two times a day (BID) | INTRAVENOUS | Status: DC
  Administered 2022-08-20 – 2022-08-22 (×3): 3 mL via INTRAVENOUS

## 2022-08-20 MED ORDER — FENTANYL CITRATE (PF) 100 MCG/2ML IJ SOLN
INTRAMUSCULAR | Status: AC
  Filled 2022-08-20: qty 2

## 2022-08-20 MED ORDER — HYDRALAZINE HCL 20 MG/ML IJ SOLN: 10.0000 mg | INTRAMUSCULAR | Status: AC | PRN

## 2022-08-20 MED ORDER — SODIUM CHLORIDE 0.9% FLUSH: 3.0000 mL | INTRAVENOUS | Status: DC | PRN

## 2022-08-20 MED ORDER — LABETALOL HCL 5 MG/ML IV SOLN: 10.0000 mg | INTRAVENOUS | Status: AC | PRN

## 2022-08-20 MED ORDER — LIDOCAINE HCL (PF) 1 % IJ SOLN
INTRAMUSCULAR | Status: AC
  Filled 2022-08-20: qty 30

## 2022-08-20 MED ORDER — SODIUM CHLORIDE 0.9 % IV SOLN: 250.0000 mL | INTRAVENOUS | Status: DC | PRN

## 2022-08-20 MED ORDER — HEPARIN (PORCINE) 25000 UT/250ML-% IV SOLN
1300.0000 [IU]/h | INTRAVENOUS | Status: DC
  Administered 2022-08-20: 1100 [IU]/h via INTRAVENOUS
  Administered 2022-08-21: 1300 [IU]/h via INTRAVENOUS
  Filled 2022-08-20 (×3): qty 250

## 2022-08-20 MED ORDER — MILRINONE LACTATE IN DEXTROSE 20-5 MG/100ML-% IV SOLN
0.3750 ug/kg/min | INTRAVENOUS | Status: DC
  Administered 2022-08-20: 0.25 ug/kg/min via INTRAVENOUS
  Administered 2022-08-21: 0.375 ug/kg/min via INTRAVENOUS
  Administered 2022-08-21: 0.25 ug/kg/min via INTRAVENOUS
  Administered 2022-08-22 – 2022-08-23 (×2): 0.375 ug/kg/min via INTRAVENOUS
  Filled 2022-08-20 (×6): qty 100

## 2022-08-20 MED ORDER — ACETAMINOPHEN 325 MG PO TABS: 650.0000 mg | ORAL_TABLET | ORAL | Status: DC | PRN

## 2022-08-20 MED ORDER — SODIUM CHLORIDE 0.9% FLUSH
10.0000 mL | Freq: Two times a day (BID) | INTRAVENOUS | Status: DC
  Administered 2022-08-20 – 2022-08-23 (×3): 10 mL

## 2022-08-20 MED ORDER — SODIUM CHLORIDE 0.9% FLUSH: 10.0000 mL | INTRAVENOUS | Status: DC | PRN

## 2022-08-20 MED ORDER — AMIODARONE HCL 200 MG PO TABS
200.0000 mg | ORAL_TABLET | Freq: Every day | ORAL | Status: DC
  Administered 2022-08-20 – 2022-08-23 (×3): 200 mg via ORAL
  Filled 2022-08-20 (×3): qty 1

## 2022-08-20 MED ORDER — MIDAZOLAM HCL 2 MG/2ML IJ SOLN
INTRAMUSCULAR | Status: AC
  Filled 2022-08-20: qty 2

## 2022-08-20 MED ORDER — SODIUM CHLORIDE 0.9% IV SOLUTION: INTRAVENOUS | Status: DC | PRN

## 2022-08-20 MED ORDER — SODIUM CHLORIDE 0.9% FLUSH
3.0000 mL | Freq: Two times a day (BID) | INTRAVENOUS | Status: DC
  Administered 2022-08-20 – 2022-08-23 (×3): 3 mL via INTRAVENOUS

## 2022-08-20 MED ORDER — HEPARIN (PORCINE) IN NACL 1000-0.9 UT/500ML-% IV SOLN
INTRAVENOUS | Status: AC
  Filled 2022-08-20: qty 500

## 2022-08-20 MED ORDER — MELATONIN 5 MG PO TABS
5.0000 mg | ORAL_TABLET | Freq: Every evening | ORAL | Status: DC | PRN
  Administered 2022-08-20 – 2022-08-21 (×2): 5 mg via ORAL
  Filled 2022-08-20 (×2): qty 1

## 2022-08-20 MED ORDER — ASPIRIN 81 MG PO CHEW
81.0000 mg | CHEWABLE_TABLET | ORAL | Status: AC
  Administered 2022-08-20: 81 mg via ORAL
  Filled 2022-08-20: qty 1

## 2022-08-20 MED ORDER — ONDANSETRON HCL 4 MG/2ML IJ SOLN
4.0000 mg | Freq: Four times a day (QID) | INTRAMUSCULAR | Status: DC | PRN
  Administered 2022-08-21: 4 mg via INTRAVENOUS
  Filled 2022-08-20: qty 2

## 2022-08-20 MED ORDER — HEPARIN (PORCINE) IN NACL 1000-0.9 UT/500ML-% IV SOLN
INTRAVENOUS | Status: DC | PRN
  Administered 2022-08-20: 500 mL

## 2022-08-20 SURGICAL SUPPLY — 8 items
CATH SWAN GANZ 7FR S TIP (CATHETERS) IMPLANT
PACK CARDIAC CATHETERIZATION (CUSTOM PROCEDURE TRAY) ×1 IMPLANT
SHEATH PINNACLE 7F 10CM (SHEATH) IMPLANT
SHEATH PROBE COVER 6X72 (BAG) IMPLANT
SLEEVE REPOSITIONING LENGTH 30 (MISCELLANEOUS) IMPLANT
TRANSDUCER W/STOPCOCK (MISCELLANEOUS) ×1 IMPLANT
TUBING ART PRESS 72  MALE/FEM (TUBING) ×1
TUBING ART PRESS 72 MALE/FEM (TUBING) IMPLANT

## 2022-08-20 NOTE — Interval H&P Note (Signed)
History and Physical Interval Note:  08/20/2022 11:05 AM  Bruce Robertson  has presented today for surgery, with the diagnosis of heart failure.  The various methods of treatment have been discussed with the patient and family. After consideration of risks, benefits and other options for treatment, the patient has consented to  Procedure(s): RIGHT HEART CATH (N/A) as a surgical intervention.  The patient's history has been reviewed, patient examined, no change in status, stable for surgery.  I have reviewed the patient's chart and labs.  Questions were answered to the patient's satisfaction.     [name]

## 2022-08-20 NOTE — Progress Notes (Signed)
ANTICOAGULATION CONSULT NOTE - Follow Up Consult  Pharmacy Consult for heparin Indication: atrial fibrillation  No Known Allergies  Patient Measurements: Height: 6' (182.9 cm) Weight: 80.2 kg (176 lb 12.9 oz) IBW/kg (Calculated) : 77.6 Heparin Dosing Weight: 80 kg  Vital Signs: Temp: 98.4 F (36.9 C) (10/17 2100) Temp Source: Core (Comment) (10/17 1600) BP: 109/75 (10/17 2100) Pulse Rate: 64 (10/17 2100)  Labs: Recent Labs    08/19/22 1058 08/20/22 1001 08/20/22 1123 08/20/22 1317 08/20/22 2158  HGB 10.5* 10.2* 9.9*  10.9*  --   --   HCT 33.1* 33.4* 29.0*  32.0*  --   --   PLT 187 195  --   --   --   APTT  --   --   --   --  47*  HEPARINUNFRC  --   --   --   --  0.51  CREATININE 2.35* 1.95*  --  1.83*  --     Estimated Creatinine Clearance: 47.7 mL/min (A) (by C-G formula based on SCr of 1.83 mg/dL (H)).  Assessment: 61 y/o male with CHF class IV and afib on Eliquis, which is on hold while awaiting Impella placement. Last dose taken 10/16. Heparin was started post right heart cath 10/17, PTT subtherapeutic at 47.  Goal of Therapy:  aPTT 66-102 seconds Monitor platelets by anticoagulation protocol: Yes   Plan:  Increase heparin infusion to 1300 units/hr Continue to monitor PTT, H&H, and platelets  Lerry Liner, PharmD Candidate class of 2024 08/20/2022,11:36 PM

## 2022-08-20 NOTE — Progress Notes (Signed)
Heart Failure Navigator Progress Note  Assessed for Heart & Vascular TOC clinic readiness.  Patient does not meet criteria due to Advanced Heart Failure Team patient of Dr. Bensimhon.     Romonda Parker, BSN, RN Heart Failure Nurse Navigator Secure Chat Only   

## 2022-08-20 NOTE — TOC Initial Note (Addendum)
Transition of Care [facility]) - Initial/Assessment Note    Patient Details  Name: Bruce Robertson MRN: [id number] Date of Birth: Oct 01, 1961  Transition of Care [facility]) CM/SW Contact:    Erenest Rasher, RN Phone Number: [phone] 08/20/2022, 5:03 PM  Clinical Narrative:                 HF TOC CM spoke to pt at bedside. States he was independent PTA. Wife drives him to appt. He has a cane and CPAP at home. Weighs daily. Will continue to follow for dc needs. Pt active with [facility] for [facility]. Will need resumption of care orders at dc.   Expected Discharge Plan: Home/Self Care Barriers to Discharge: Continued Medical Work up   Patient Goals and CMS Choice Patient states their goals for this hospitalization and ongoing recovery are:: remain independent at home CMS Medicare.gov Compare Post Acute Care list provided to:: Patient    Expected Discharge Plan and Services Expected Discharge Plan: Home/Self Care   Discharge Planning Services: CM Consult   Living arrangements for the past 2 months: Single Family Home                                      Prior Living Arrangements/Services Living arrangements for the past 2 months: Single Family Home Lives with:: Spouse Patient language and need for interpreter reviewed:: Yes Do you feel safe going back to the place where you live?: Yes      Need for Family Participation in Patient Care: No (Comment) Care giver support system in place?: Yes (comment) Current home services: DME ([employer]) Criminal Activity/Legal Involvement Pertinent to Current Situation/Hospitalization: No - Comment as needed  Activities of Daily Living Home Assistive Devices/Equipment: None ADL Screening (condition at time of admission) Is the patient deaf or have difficulty hearing?: No Does the patient have difficulty seeing, even when wearing glasses/contacts?: No Does the patient have difficulty concentrating, remembering, or making decisions?: No Patient  able to express need for assistance with ADLs?: Yes Does the patient have difficulty dressing or bathing?: No Independently performs ADLs?: Yes (appropriate for developmental age) Does the patient have difficulty walking or climbing stairs?: No Weakness of Legs: None Weakness of Arms/Hands: None  Permission Sought/Granted Permission sought to share information with : Case Manager, Family Supports, PCP    Share Information with NAME: Jaclyn Shaggy     Permission granted to share info w Relationship: SO  Permission granted to share info w Contact Information: [phone]  Emotional Assessment Appearance:: Appears stated age Attitude/Demeanor/Rapport: Engaged Affect (typically observed): Accepting Orientation: : Oriented to Self, Oriented to Place, Oriented to  Time, Oriented to Situation   Psych Involvement: No (comment)  Admission diagnosis:  Acute on chronic systolic heart failure, NYHA class 4 (HCC) [I50.23] Patient Active Problem List   Diagnosis Date Noted   Acute on chronic systolic heart failure, NYHA class 4 ([place]) 08/20/2022   Acute on chronic systolic CHF (congestive heart failure) ([place]) 04/25/2022   Acute on chronic systolic (congestive) heart failure ([place]) 04/25/2022   CHF exacerbation ([place]) 04/15/2022   Type 2 diabetes mellitus with complication, without long-term current use of insulin ([place]) 04/04/2022   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   Dizziness 12/31/2020   Patellar tendon rupture, right, initial encounter 09/13/2020   Restrictive lung disease 06/22/2020   BRBPR (bright red blood per rectum) 71/24/5809   Chronic systolic CHF (congestive heart failure) (  [place]) 05/24/2020   Acute blood loss anemia 05/24/2020   Paroxysmal atrial fibrillation ([place]) 03/28/2020   Secondary hypercoagulable state ([place]) 03/28/2020   Educated about COVID-19 virus infection 02/10/2020   S/P ICD (internal cardiac defibrillator) procedure 07/18/2019   Nontraumatic complete tear of  right rotator cuff 06/29/2019   Pain in left elbow 06/29/2019   Prediabetes 06/10/2018   Thrombocytopenia ([place]) 06/10/2018   AICD (automatic cardioverter/defibrillator) present 06/10/2018   History of peptic ulcer disease 06/10/2018   NSVT (nonsustained ventricular tachycardia) (HCC)    Hypotension due to drugs 02/09/2018   Difficulty controlling anger 12/31/2016   Anxiety 08/20/2016   Moderate episode of recurrent major depressive disorder ([place]) 08/20/2016   Polyp of colon 04/29/2016   Shortness of breath 03/05/2016   Essential hypertension 10/31/2015   Acute on chronic systolic heart failure ([place]) 08/29/2015   Hx of CABG 08/29/2015   Stage 3 chronic kidney disease ([place]) 08/29/2015   OSA treated with BiPAP 06/10/2011   Fatigue 06/10/2011   Obesity (BMI 30-39.9) 05/02/2010   Hemorrhoids 02/08/2010   GERD (gastroesophageal reflux disease) 02/08/2010   Rectal bleeding 02/08/2010   Hyperlipidemia 04/06/2009   Cardiomyopathy, dilated, nonischemic ([place]) 04/06/2009   Non-ischemic cardiomyopathy ([place]) 04/06/2009   PCP:  Caren Macadam, MD (Inactive) Pharmacy:   [facility] [address] ([place]), [place] - [address] [place] ([place]) [place] [address] Phone: [phone] Fax: [phone]     Social Determinants of Health (SDOH) Interventions    Readmission Risk Interventions     No data to display

## 2022-08-20 NOTE — Progress Notes (Signed)
ANTICOAGULATION CONSULT NOTE - Initial Consult  Pharmacy Consult for IV heparin (Eliquis on hold) Indication: afib  No Known Allergies  Patient Measurements: Height: 6' (182.9 cm) Weight: 80.2 kg (176 lb 12.9 oz) IBW/kg (Calculated) : 77.6 Heparin Dosing Weight: 80.2 kg  Vital Signs: Temp: 98.4 F (36.9 C) (10/17 0951) Temp Source: Oral (10/17 0951) BP: 105/66 (10/17 1143) Pulse Rate: 56 (10/17 1143)  Labs: Recent Labs    08/19/22 1058 08/20/22 1001  HGB 10.5* 10.2*  HCT 33.1* 33.4*  PLT 187 195  CREATININE 2.35* 1.95*    Estimated Creatinine Clearance: 44.8 mL/min (A) (by C-G formula based on SCr of 1.95 mg/dL (H)).   Medical History: Past Medical History:  Diagnosis Date   AICD (automatic cardioverter/defibrillator) present    Anxiety 08/20/2016   Barrett's esophagus 2002   Cardiomyopathy    Nonischemic. EF has been about 45%. S/P CABG.;  b.  Echo 4/14: EF 25%, global HK with inf and mid apical AK, restrictive physiology with E/e' > 15 (elevated LV filling pressure), trivial AI/MR, mild to mod LAE, mild RVE, mild reduced RVSF, mild RAE, mod TR, PASP 74 (severe pulmonary HTN)    CHF (congestive heart failure) (HCC)    CHF exacerbation (HCC) 07/31/2016   Colon polyps 2002   Coronary artery disease    Diabetes mellitus without complication (HCC)    Type II   Dyslipidemia    hx (03/20/2017)   GERD (gastroesophageal reflux disease)    History of bleeding peptic ulcer    History of echocardiogram    Echo 10/16:  EF 25-30%, poss non-compaction, diff HK with inf-lat and apical HK, restrictive physio, mild AI, severe LAE, mild RVE with mild reduced RVSF, PASP 42 mmHg   Hyperplastic rectal polyp    Moderate episode of recurrent major depressive disorder ([place]) 08/20/2016   09/14/20 - not current   OSA on CPAP    Mild   Pneumonia 09/2018   Tobacco abuse    Remote    Medications:  Infusions:   sodium chloride     sodium chloride     sodium chloride     sodium  chloride     sodium chloride     heparin     milrinone      Assessment: 61 yo male on chronic Eliquis for afib, currently this is on hold for Impella placement.  Last dose taken 10/16.  Pharmacy asked to begin IV heparin 2 hrs after cath lab.  Procedure ended about 1130 AM.  Goal of Therapy:  Heparin level 0.3-0.7 units/ml Monitor platelets by anticoagulation protocol: Yes   Plan: Start IV heparin without bolus at rate of 1100 units/hr. Check aPTT and heparin level 8 hrs after gtt starts (expect heparin level will be falsely elevated given recent Eliquis). Daily aPTT, heparin level and CBC.  [place] Crane, Roylene Reason, BCCP Clinical Pharmacist  08/20/2022 1:46 PM   [facility] pharmacy phone numbers are listed on [email or web address]

## 2022-08-20 NOTE — Plan of Care (Signed)
  Problem: Education: Goal: Understanding of CV disease, CV risk reduction, and recovery process will improve Outcome: Progressing   Problem: Cardiovascular: Goal: Ability to achieve and maintain adequate cardiovascular perfusion will improve Outcome: Progressing   Problem: Clinical Measurements: Goal: Will remain free from infection Outcome: Progressing Goal: Cardiovascular complication will be avoided Outcome: Progressing   Problem: Nutrition: Goal: Adequate nutrition will be maintained Outcome: Progressing   Problem: Pain Managment: Goal: General experience of comfort will improve Outcome: Progressing   Problem: Safety: Goal: Ability to remain free from injury will improve Outcome: Progressing

## 2022-08-21 ENCOUNTER — Other Ambulatory Visit (HOSPITAL_COMMUNITY): Payer: Self-pay | Admitting: Internal Medicine

## 2022-08-21 ENCOUNTER — Inpatient Hospital Stay (HOSPITAL_COMMUNITY): Payer: Medicare Other

## 2022-08-21 DIAGNOSIS — I255 Ischemic cardiomyopathy: Secondary | ICD-10-CM

## 2022-08-21 DIAGNOSIS — I5023 Acute on chronic systolic (congestive) heart failure: Secondary | ICD-10-CM | POA: Diagnosis not present

## 2022-08-21 LAB — PREPARE RBC (CROSSMATCH)

## 2022-08-21 LAB — BASIC METABOLIC PANEL
Anion gap: 15 (ref 5–15)
BUN: 28 mg/dL — ABNORMAL HIGH (ref 6–20)
CO2: 25 mmol/L (ref 22–32)
Calcium: 9.8 mg/dL (ref 8.9–10.3)
Chloride: 95 mmol/L — ABNORMAL LOW (ref 98–111)
Creatinine, Ser: 2 mg/dL — ABNORMAL HIGH (ref 0.61–1.24)
GFR, Estimated: 38 mL/min — ABNORMAL LOW (ref >60)
Glucose, Bld: 117 mg/dL — ABNORMAL HIGH (ref 70–99)
Potassium: 3.8 mmol/L (ref 3.5–5.1)
Sodium: 135 mmol/L (ref 135–145)

## 2022-08-21 LAB — URINALYSIS, ROUTINE W REFLEX MICROSCOPIC
Bilirubin Urine: NEGATIVE
Glucose, UA: 150 mg/dL — AB
Hgb urine dipstick: NEGATIVE
Ketones, ur: NEGATIVE mg/dL
Leukocytes,Ua: NEGATIVE
Nitrite: NEGATIVE
Protein, ur: NEGATIVE mg/dL
Specific Gravity, Urine: 1.012 (ref 1.005–1.030)
pH: 5 (ref 5.0–8.0)

## 2022-08-21 LAB — COOXEMETRY PANEL
Carboxyhemoglobin: 2 % — ABNORMAL HIGH (ref 0.5–1.5)
Carboxyhemoglobin: 0.4 % — ABNORMAL LOW (ref 0.5–1.5)
Methemoglobin: <0.7 % (ref 0.0–1.5)
Methemoglobin: 1.4 % (ref 0.0–1.5)
O2 Saturation: 50 %
O2 Saturation: 47.7 %
Total hemoglobin: 10.2 g/dL — ABNORMAL LOW (ref 12.0–16.0)
Total hemoglobin: 11 g/dL — ABNORMAL LOW (ref 12.0–16.0)

## 2022-08-21 LAB — CBC
HCT: 32 % — ABNORMAL LOW (ref 39.0–52.0)
Hemoglobin: 9.9 g/dL — ABNORMAL LOW (ref 13.0–17.0)
MCH: 26 pg (ref 26.0–34.0)
MCHC: 30.9 g/dL (ref 30.0–36.0)
MCV: 84 fL (ref 80.0–100.0)
Platelets: 170 10*3/uL (ref 150–400)
RBC: 3.81 MIL/uL — ABNORMAL LOW (ref 4.22–5.81)
RDW: 16.6 % — ABNORMAL HIGH (ref 11.5–15.5)
WBC: 5.5 10*3/uL (ref 4.0–10.5)
nRBC: 0 % (ref 0.0–0.2)

## 2022-08-21 LAB — PROTIME-INR
INR: 1.3 — ABNORMAL HIGH (ref 0.8–1.2)
Prothrombin Time: 15.7 seconds — ABNORMAL HIGH (ref 11.4–15.2)

## 2022-08-21 LAB — HEPARIN LEVEL (UNFRACTIONATED): Heparin Unfractionated: 0.54 IU/mL (ref 0.30–0.70)

## 2022-08-21 LAB — APTT: aPTT: 59 seconds — ABNORMAL HIGH (ref 24–36)

## 2022-08-21 LAB — SURGICAL PCR SCREEN
MRSA, PCR: NEGATIVE
Staphylococcus aureus: NEGATIVE

## 2022-08-21 LAB — TSH: TSH: 5.424 u[IU]/mL — ABNORMAL HIGH (ref 0.350–4.500)

## 2022-08-21 MED ORDER — POTASSIUM CHLORIDE CRYS ER 20 MEQ PO TBCR
40.0000 meq | EXTENDED_RELEASE_TABLET | Freq: Once | ORAL | Status: AC
  Administered 2022-08-21: 40 meq via ORAL
  Filled 2022-08-21: qty 2

## 2022-08-21 MED ORDER — FUROSEMIDE 10 MG/ML IJ SOLN
80.0000 mg | Freq: Two times a day (BID) | INTRAMUSCULAR | Status: DC
  Administered 2022-08-21 (×2): 80 mg via INTRAVENOUS
  Filled 2022-08-21 (×2): qty 8

## 2022-08-21 MED ORDER — NOREPINEPHRINE 4 MG/250ML-% IV SOLN
3.0000 ug/min | INTRAVENOUS | Status: DC
  Administered 2022-08-21: 3 ug/min via INTRAVENOUS
  Filled 2022-08-21 (×2): qty 250

## 2022-08-21 MED ORDER — METOLAZONE 5 MG PO TABS
5.0000 mg | ORAL_TABLET | Freq: Once | ORAL | Status: AC
  Administered 2022-08-21: 5 mg via ORAL
  Filled 2022-08-21: qty 1

## 2022-08-21 MED ORDER — CHLORHEXIDINE GLUCONATE CLOTH 2 % EX PADS
6.0000 | MEDICATED_PAD | Freq: Four times a day (QID) | CUTANEOUS | Status: DC
  Administered 2022-08-21 – 2022-08-22 (×2): 6 via TOPICAL

## 2022-08-21 MED ORDER — MENTHOL 3 MG MT LOZG
1.0000 | LOZENGE | OROMUCOSAL | Status: DC | PRN
  Administered 2022-08-21: 3 mg via ORAL
  Filled 2022-08-21 (×2): qty 9

## 2022-08-21 MED ORDER — TRAZODONE HCL 50 MG PO TABS
50.0000 mg | ORAL_TABLET | Freq: Every day | ORAL | Status: DC
  Administered 2022-08-21 – 2022-08-22 (×2): 50 mg via ORAL
  Filled 2022-08-21 (×2): qty 1

## 2022-08-21 MED ORDER — CEFAZOLIN SODIUM-DEXTROSE 2-4 GM/100ML-% IV SOLN
2.0000 g | INTRAVENOUS | Status: AC
  Administered 2022-08-22: 2 g via INTRAVENOUS
  Filled 2022-08-21: qty 100

## 2022-08-21 NOTE — Consult Note (Signed)
[address]            [place] [address]          [phone]       Majd Labrake [place] Medical Record #[id number] Date of Birth: 02/28/1961  Bensimhon, Shaune Pascal, MD Caren Macadam, MD (Inactive)  Chief Complaint:   fatigue low cardiac output  History of Present Illness:     Patient examined, right heart cath data and most recent chest x-ray and CT scan of chest images personally reviewed.  Images of most recent echocardiogram August 2023 personally reviewed showing severe LV dysfunction and mild to moderate aortic insufficiency.  Patient is being prepared for cardiac transplantation for his ischemic cardiomyopathy refractory to medical therapy.  Optimization of his hemodynamics as needed as well as improvement in renal function before transplantation.  For that reason the patient has been recommended for placement of a temporary percutaneous Impella 5.5 ventricular assist device.  I agree with that recommendation and discussed the procedure with the patient in his room.  He is currently in the ICU with a PA catheter on low to medium dose norepinephrine and milrinone.  His cardiac index has improved since admission but he still feels poorly with profound fatigue.  He has been on long-term Eliquis but stopped 3 days ago and he is now on IV heparin. Current Activity/ Functional Status: Patient has very poor functional status and is unable to do his activities of daily living due to his heart failure.   Past Medical History:  Diagnosis Date   AICD (automatic cardioverter/defibrillator) present    Anxiety 08/20/2016   Barrett's esophagus 2002   Cardiomyopathy    Nonischemic. EF has been about 45%. S/P CABG.;  b.  Echo 4/14: EF 25%, global HK with inf and mid apical AK, restrictive physiology with E/e' > 15 (elevated LV filling pressure), trivial AI/MR, mild to mod LAE, mild RVE, mild reduced RVSF, mild RAE, mod TR, PASP 74 (severe  pulmonary HTN)    CHF (congestive heart failure) (HCC)    CHF exacerbation (HCC) 07/31/2016   Colon polyps 2002   Coronary artery disease    Diabetes mellitus without complication (HCC)    Type II   Dyslipidemia    hx (03/20/2017)   GERD (gastroesophageal reflux disease)    History of bleeding peptic ulcer    History of echocardiogram    Echo 10/16:  EF 25-30%, poss non-compaction, diff HK with inf-lat and apical HK, restrictive physio, mild AI, severe LAE, mild RVE with mild reduced RVSF, PASP 42 mmHg   Hyperplastic rectal polyp    Moderate episode of recurrent major depressive disorder ([place]) 08/20/2016   09/14/20 - not current   OSA on CPAP    Mild   Pneumonia 09/2018   Tobacco abuse    Remote    Past Surgical History:  Procedure Laterality Date   CARDIAC CATHETERIZATION  2007;  2009   CARDIAC DEFIBRILLATOR PLACEMENT  03/20/2017   COLONOSCOPY WITH PROPOFOL N/A 01/21/2019   Procedure: COLONOSCOPY WITH PROPOFOL;  Surgeon: Milus Banister, MD;  Location: [facility];  Service: Endoscopy;  Laterality: N/A;   CORONARY ARTERY BYPASS GRAFT  2007   Had a left main dissection after catheterization. Underwent a saphenous vein graft to the LAD and a saphenous vein graft to obtuse marginal.   HEMORRHOID SURGERY N/A 08/23/2020   Procedure: HEMORRHOIDECTOMY  WITH LIGATION AND HEMORRHOIDOPEXY;  Surgeon: Michael Boston, MD;  Location: [facility];  Service: General;  Laterality: N/A;   ICD IMPLANT N/A 03/20/2017   Procedure: ICD Implant;  Surgeon: Constance Haw, MD;  Location: [facility];  Service: Cardiovascular;  Laterality: N/A;   IR FLUORO GUIDE CV LINE RIGHT  04/29/2022   IR US GUIDE VASC ACCESS RIGHT  04/29/2022   PATELLAR TENDON REPAIR Right 2005   PATELLAR TENDON REPAIR Right 09/21/2020   Procedure: PATELLA TENDON REPAIR;  Surgeon: Leandrew Koyanagi, MD;  Location: [facility];  Service: Orthopedics;  Laterality: Right;   POLYPECTOMY  01/21/2019   Procedure: POLYPECTOMY;  Surgeon:  Milus Banister, MD;  Location: [facility];  Service: Endoscopy;;   RECTAL EXAM UNDER ANESTHESIA Left 08/23/2020   Procedure: RECTAL EXAM UNDER ANESTHESIA;  Surgeon: Michael Boston, MD;  Location: [facility];  Service: General;  Laterality: Left;   RIGHT HEART CATH N/A 04/25/2022   Procedure: RIGHT HEART CATH;  Surgeon: Jolaine Artist, MD;  Location: [facility];  Service: Cardiovascular;  Laterality: N/A;   RIGHT HEART CATH N/A 08/20/2022   Procedure: RIGHT HEART CATH;  Surgeon: Jolaine Artist, MD;  Location: [facility];  Service: Cardiovascular;  Laterality: N/A;    Social History   Tobacco Use  Smoking Status Former   Packs/day: 0.25   Years: 7.00   Total pack years: 1.75   Types: Cigarettes  Smokeless Tobacco Never  Tobacco Comments   03/20/2017 "quit in 1997    Social History   Substance and Sexual Activity  Alcohol Use Not Currently   Comment: socially    Social History   Socioeconomic History   Marital status: Significant Other    Spouse name: Not on file   Number of children: 5   Years of education: Not on file   Highest education level: High school graduate  Occupational History   Occupation: Civil engineer, contracting    Employer: [employer]    Comment: Disability 4 years  Tobacco Use   Smoking status: Former    Packs/day: 0.25    Years: 7.00    Total pack years: 1.75    Types: Cigarettes   Smokeless tobacco: Never   Tobacco comments:    03/20/2017 "quit in 1997  Vaping Use   Vaping Use: Former   Substances: [place]: vaped for about a month  Substance and Sexual Activity   Alcohol use: Not Currently    Comment: socially   Drug use: Yes    Frequency: 1.0 times per week    Types: Marijuana   Sexual activity: Yes  Other Topics Concern   Not on file  Social History Narrative   Single   Social Determinants of Health   Financial Resource Strain: Low Risk  (01/29/2022)   Overall Financial Resource Strain (CARDIA)    Difficulty  of Paying Living Expenses: Not very hard  Food Insecurity: No Food Insecurity (04/17/2022)   Hunger Vital Sign    Worried About Running Out of Food in the Last Year: Never true    [place] in the Last Year: Never true  Transportation Needs: No Transportation Needs (06/06/2022)   PRAPARE - Hydrologist (Medical): No    Lack of Transportation (Non-Medical): No  Physical Activity: Inactive (11/20/2021)   Exercise Vital Sign    Days of Exercise per Week: 0 days    Minutes of Exercise per Session:  0 min  Stress: No Stress Concern Present (11/20/2021)   [place]    Feeling of Stress : Only a little  Social Connections: Moderately Isolated (11/14/2020)   Social Connection and Isolation Panel [NHANES]    Frequency of Communication with Friends and Family: More than three times a week    Frequency of Social Gatherings with Friends and Family: Never    Attends Religious Services: More than 4 times per year    Active Member of [employer] or Organizations: No    Attends Archivist Meetings: Never    Marital Status: Divorced  Human resources officer Violence: Not At Risk (06/06/2022)   Humiliation, Afraid, Rape, and Kick questionnaire    Fear of Current or Ex-Partner: No    Emotionally Abused: No    Physically Abused: No    Sexually Abused: No    No Known Allergies  Current Facility-Administered Medications  Medication Dose Route Frequency Provider Last Rate Last Admin   0.9 %  sodium chloride infusion (Manually program via Guardrails IV Fluids)   Intravenous Continuous Bensimhon, Shaune Pascal, MD       0.9 %  sodium chloride infusion (Manually program via Guardrails IV Fluids)   Intravenous PRN Bensimhon, Shaune Pascal, MD       0.9 %  sodium chloride infusion  250 mL Intravenous PRN Bensimhon, Shaune Pascal, MD       0.9 %  sodium chloride infusion   Intravenous Continuous Bensimhon, Shaune Pascal, MD       0.9 %   sodium chloride infusion   Intravenous Continuous Bensimhon, Shaune Pascal, MD 10 mL/hr at 08/21/22 1800 Infusion Verify at 08/21/22 1800   0.9 %  sodium chloride infusion  250 mL Intravenous PRN Bensimhon, Shaune Pascal, MD       acetaminophen (TYLENOL) tablet 650 mg  650 mg Oral Q4H PRN Bensimhon, Shaune Pascal, MD       albuterol (PROVENTIL) (2.5 MG/3ML) 0.083% nebulizer solution 2.5 mg  2.5 mg Nebulization Q6H PRN Bensimhon, Shaune Pascal, MD       amiodarone (PACERONE) tablet 200 mg  200 mg Oral Daily Lyda Jester M, PA-C   200 mg at 08/21/22 0916   atorvastatin (LIPITOR) tablet 40 mg  40 mg Oral Daily Lyda Jester M, PA-C   40 mg at 08/21/22 3474   ceFAZolin (ANCEF) IVPB 2g/100 mL premix  2 g Intravenous 30 min Pre-Op Dahlia Byes, MD       Chlorhexidine Gluconate Cloth 2 % PADS 6 each  6 each Topical Daily Bensimhon, Shaune Pascal, MD   6 each at 08/21/22 0918   Chlorhexidine Gluconate Cloth 2 % PADS 6 each  6 each Topical Q6H Dahlia Byes, MD   6 each at 08/21/22 1827   furosemide (LASIX) injection 80 mg  80 mg Intravenous BID Bensimhon, Shaune Pascal, MD   80 mg at 08/21/22 1820   heparin ADULT infusion 100 units/mL (25000 units/255m)  1,300 Units/hr Intravenous Continuous GLerry Liner Student-PharmD 13 mL/hr at 08/21/22 1800 1,300 Units/hr at 08/21/22 1800   melatonin tablet 5 mg  5 mg Oral QHS PRN YLaurice Record MD   5 mg at 08/20/22 2217   menthol-cetylpyridinium (CEPACOL) lozenge 3 mg  1 lozenge Oral PRN Bensimhon, DShaune Pascal MD       milrinone (PRIMACOR) 20 MG/100 ML (0.2 mg/mL) infusion  0.375 mcg/kg/min (Order-Specific) Intravenous Continuous Bensimhon, DShaune Pascal MD 9.9 mL/hr at 08/21/22 1800 0.375 mcg/kg/min at 08/21/22 1800  norepinephrine (LEVOPHED) '4mg'$  in 229m (0.016 mg/mL) premix infusion  3 mcg/min Intravenous Continuous Bensimhon, DShaune Pascal MD 11.25 mL/hr at 08/21/22 1800 3 mcg/min at 08/21/22 1800   ondansetron (ZOFRAN) injection 4 mg  4 mg Intravenous Q6H PRN Bensimhon, DShaune Pascal MD   4  mg at 08/21/22 0540   sodium chloride flush (NS) 0.9 % injection 10-40 mL  10-40 mL Intracatheter Q12H Bensimhon, DShaune Pascal MD   10 mL at 08/21/22 0918   sodium chloride flush (NS) 0.9 % injection 10-40 mL  10-40 mL Intracatheter PRN Bensimhon, DShaune Pascal MD       sodium chloride flush (NS) 0.9 % injection 3 mL  3 mL Intravenous Q12H Bensimhon, DShaune Pascal MD   3 mL at 08/20/22 1346   sodium chloride flush (NS) 0.9 % injection 3 mL  3 mL Intravenous Q12H Bensimhon, DShaune Pascal MD   3 mL at 08/20/22 1345   sodium chloride flush (NS) 0.9 % injection 3 mL  3 mL Intravenous PRN Bensimhon, DShaune Pascal MD       sodium chloride flush (NS) 0.9 % injection 3 mL  3 mL Intravenous Q12H Bensimhon, DShaune Pascal MD   3 mL at 08/20/22 2217   sodium chloride flush (NS) 0.9 % injection 3 mL  3 mL Intravenous PRN Bensimhon, DShaune Pascal MD       traZODone (DESYREL) tablet 50 mg  50 mg Oral QHS Bensimhon, DShaune Pascal MD         Family History  Problem Relation Age of Onset   Hypertension Mother    Osteoarthritis Mother    Heart failure Father 671  High blood pressure Father    Bipolar disorder Daughter    Diabetes Daughter    Cancer Maternal Uncle        uncertain type   Cancer Maternal Grandmother        uncertain type   Diabetes Niece    Lung cancer Other    Asthma Neg Hx    Breast cancer Neg Hx      Review of Systems:     Cardiac Review of Systems: Y or N  Chest Pain [    ]  Resting SOB [   ] Exertional SOB  [ y ]  Orthopnea [  y]   Pedal Edema [ y  ]    Palpitations [  y] Syncope  [  ]   Presyncope [   ]  General Review of Systems: [Y] = yes [  ]=no Constitional: recent weight change [  ]; anorexia [  y]; fatigue [ y ]; nausea [ y ]; night sweats [  ]; fever [  ]; or chills [  ];  Dental: poor dentition[  ]; Last Dentist visit: 1 year  Eye : blurred vision [  ]; diplopia [    ]; vision changes [  ];  Amaurosis fugax[  ]; Resp: cough [  ];  wheezing[  ];  hemoptysis[  ]; shortness of breath[  y]; paroxysmal nocturnal dyspnea[  ]; dyspnea on exertion[  ]; or orthopnea[  ];  GI:  gallstones[  ], vomiting[  ];  dysphagia[  ]; melena[  ];  hematochezia [  ]; heartburn[  ];   Hx of  Colonoscopy[  ]; GU: kidney stones [  ]; hematuria[  ];   dysuria [  ];  nocturia[  ];  history of     obstruction [  ];                 Skin: rash, swelling[  ];, hair loss[  ];  peripheral edema[  ];  or itching[  ]; Musculosketetal: myalgias[  ];  joint swelling[  ];  joint erythema[  ];  joint pain[  ];  back pain[ y ];  Heme/Lymph: bruising[  ];  bleeding[  ];  anemia[  ];  Neuro: TIA[  ];  headaches[  ];  stroke[  ];  vertigo[  ];  seizures[  ];   paresthesias[  ];  difficulty walking[  ];  Psych:depression[  ]; anxiety[  ];  Endocrine: diabetes[  ];  thyroid dysfunction[  ];  Immunizations: Flu [  ]; Pneumococcal[  ];  Other:  Physical Exam: BP 129/78 (BP Location: Left Arm)   Pulse 66   Temp 99.3 F (37.4 C) (Core)   Resp 20   Ht 6' (1.829 m)   Wt 80.2 kg   SpO2 95%   BMI 23.98 kg/m       Physical Exam  General: Middle-age male who appears weak HEENT: Normocephalic pupils equal , dentition adequate Neck: Supple without JVD, adenopathy, or bruit Chest: Clear to auscultation, symmetrical breath sounds, no rhonchi, no tenderness             or deformity PICC line in right anterior chest wall Cardiovascular: Regular rate and rhythm, no murmur, no gallop, peripheral pulses             palpable in all extremities Abdomen:  Soft, nontender, no palpable mass or organomegaly Extremities: Warm, well-perfused, no clubbing cyanosis edema or tenderness,              no venous stasis changes of the legs Rectal/GU: Deferred Neuro: Grossly non--focal and symmetrical throughout Skin: Clean and dry without rash or ulceration    Diagnostic Studies & Laboratory data:     Recent  Radiology Findings:   DG Chest Port 1 View  Result Date: 08/21/2022 CLINICAL DATA:  Chronic systolic congestive heart failure. EXAM: PORTABLE CHEST 1 VIEW COMPARISON:  Radiographs 08/20/2022 and 04/15/2022.  CT 07/10/2021. FINDINGS: 0532 hours. The heart size and mediastinal contours are stable post median sternotomy and CABG. Left subclavian AICD lead appears unchanged, projecting over the right ventricular apex. Right IJ Swan-Ganz catheter has been slightly advanced into the right intralobar pulmonary artery. A smaller right IJ catheter remains looped in the lower internal jugular vein with the tip near the confluence of the brachiocephalic veins, unchanged. Probable mild residual interstitial edema. No confluent airspace opacity, pneumothorax or significant pleural effusion. The bones appear unchanged. IMPRESSION: 1. Interval slight advancement of the Swan-Ganz catheter. 2. Otherwise, no significant change from prior study. Probable mild residual interstitial  edema. 3. Right IJ catheter remains looped in the lower internal jugular vein. Electronically Signed   By: Richardean Sale M.D.   On: 08/21/2022 08:21   DG CHEST PORT 1 VIEW  Result Date: 08/20/2022 CLINICAL DATA:  12248 Chronic systolic heart failure ([place]) [address] EXAM: PORTABLE CHEST 1 VIEW COMPARISON:  04/15/2022 FINDINGS: Unchanged cardiomediastinal silhouette with postsurgical changes of CABG. Unchanged AICD lead. Pulmonary artery catheter tip overlies the right pulmonary artery on the second image. Right neck angiocatheter tip overlies the internal jugular/brachiocephalic confluence. There is central pulmonary vascular congestion and mild interstitial opacities. No pleural effusion. No evidence of pneumothorax. IMPRESSION: Mild interstitial pulmonary edema. Pulmonary artery catheter tip overlies the right pulmonary artery. Right neck angiocatheter tip overlies the internal jugular/brachiocephalic confluence. Electronically Signed   By: Maurine Simmering M.D.   On: 08/20/2022 14:28   CARDIAC CATHETERIZATION  Result Date: 08/20/2022 Findings: On milrinone 0.25 mcg/kg/min RA = 7 RV = 71/13 PA = 70/20 (37) PCW = 25 (v = 38) Fick cardiac output/index = 5.3/2.6 Thermo CO/CI = 4.3/2.1 PVR = 2.3 (Fick) 2.8 (Thermo) Ao sat = 90% PA sat = 54%, 55% PAPi = 7.1 Assessment: 1. Markedly elevated filling pressures and pulmonary venous HTN with moderately reduced CO despite milrinone support Plan/Discussion: Admit to ICU. Leave swan in. Diurese. Titrate milrinone as needed. Plan Impella 5.5 on Thursday. Start heparin 2 hours after cath. Glori Bickers, MD 12:13 PM     Recent Lab Findings: Lab Results  Component Value Date   WBC 5.5 08/21/2022   HGB 9.9 (L) 08/21/2022   HCT 32.0 (L) 08/21/2022   PLT 170 08/21/2022   GLUCOSE 117 (H) 08/21/2022   CHOL 141 11/30/2021   TRIG 82.0 11/30/2021   HDL 48.90 11/30/2021   LDLDIRECT 166.4 05/17/2010   LDLCALC 76 11/30/2021   ALT 28 08/19/2022   AST 25 08/19/2022   NA 135 08/21/2022   K 3.8 08/21/2022   CL 95 (L) 08/21/2022   CREATININE 2.00 (H) 08/21/2022   BUN 28 (H) 08/21/2022   CO2 25 08/21/2022   TSH 5.424 (H) 08/21/2022   INR 1.3 (H) 08/21/2022   HGBA1C 7.0 (H) 04/15/2022      Assessment / Plan:   Ischemic cardiomyopathy with low cardiac output, acute on chronic systolic heart failure  Patient has been evaluated for cardiac transplantation and believes he is on the waiting list at [facility].  He is recommended to have an Impella temporary LVAD placed to improve his hemodynamics and endorgan function prior to transplantation.  We will plan on placing the Impella 5.5 LVAD in the OR under general anesthesia tomorrow.  The patient understands the benefits and risks of this procedure including the potential need for blood transfusions.  He agrees to proceed.

## 2022-08-21 NOTE — Anesthesia Preprocedure Evaluation (Signed)
Anesthesia Evaluation  Patient identified by MRN, date of birth, ID band Patient awake    Reviewed: Allergy & Precautions, NPO status , Patient's Chart, lab work & pertinent test results  History of Anesthesia Complications Negative for: history of anesthetic complications  Airway Mallampati: II  TM Distance: >3 FB Neck ROM: Full    Dental  (+) Dental Advisory Given, Missing Prominent incisors:   Pulmonary sleep apnea and Continuous Positive Airway Pressure Ventilation , COPD,  COPD inhaler, former smoker   breath sounds clear to auscultation       Cardiovascular hypertension, (-) angina + CAD, + CABG and +CHF (milrinone)  + dysrhythmias Atrial Fibrillation + pacemaker + Cardiac Defibrillator  Rhythm:Regular Rate:Normal  Milrinone, epi gtts  06/2022 ECHO: EF <20%, severely hypokinetic LV, Grade 3 DD, severely decreased RVF, dilated LA, dilated RA, mild MR, severe TR, mild-mod AI   Neuro/Psych negative neurological ROS     GI/Hepatic ,GERD  Medicated and Controlled,,(+)     substance abuse  marijuana use  Endo/Other  diabetes (glu 116), Oral Hypoglycemic Agents    Renal/GU Renal InsufficiencyRenal disease     Musculoskeletal   Abdominal   Peds  Hematology  (+) Blood dyscrasia (Hb 9.9, plt 170k), anemia   Anesthesia Other Findings   Reproductive/Obstetrics                              Anesthesia Physical Anesthesia Plan  ASA: 4  Anesthesia Plan: General   Post-op Pain Management:    Induction: Intravenous  PONV Risk Score and Plan: 2 and Treatment may vary due to age or medical condition  Airway Management Planned: Oral ETT  Additional Equipment: Arterial line, PA Cath, 3D TEE and Ultrasound Guidance Line Placement  Intra-op Plan:   Post-operative Plan: Post-operative intubation/ventilation  Informed Consent: I have reviewed the patients History and Physical, chart, labs  and discussed the procedure including the risks, benefits and alternatives for the proposed anesthesia with the patient or authorized representative who has indicated his/her understanding and acceptance.     Dental advisory given  Plan Discussed with: CRNA and Surgeon  Anesthesia Plan Comments:          Anesthesia Quick Evaluation

## 2022-08-21 NOTE — Progress Notes (Addendum)
Advanced Heart Failure Rounding Note  PCP-Cardiologist: Minus Breeding, MD   Subjective:    10/17: RHC (on Milrinone 0.25) markedly elevated filling pressures and pulmonary venous HTN with moderately reduced CO, CI 2.1.   This morning, remains on milrinone 0.25.  Swan #s PAP: (36-74)/(22-35) 73/35 CVP:  [6 mmHg-14 mmHg] 10 mmHg PCWP:  [28 mmHg-32 mmHg] 32 mmHg CO:  [4.3 L/min-5.3 L/min] 5.1 L/min CI:  [2.1 L/min/m2-2.6 L/min/m2] 2.5 L/min/m2  Co-ox 50% FICK CI 1.75  PAPi 3.8 SVR 1286    2.1L in UOP w/ IV Lasix post cath.   SCr 1.95>>1.83>>2.00 K 3.8  No current resting dyspnea. Complaining of insomnia x the last 3 nights. Also w/ nausea.   Findings:   On milrinone 0.25 mcg/kg/min   RA = 7 RV = 71/13 PA = 70/20 (37) PCW = 25 (v = 38) Fick cardiac output/index = 5.3/2.6 Thermo CO/CI = 4.3/2.1 PVR = 2.3 (Fick) 2.8 (Thermo) Ao sat = 90% PA sat = 54%, 55% PAPi = 7.1  Objective:   Weight Range: 80.2 kg Body mass index is 23.98 kg/m.   Vital Signs:   Temp:  [97.9 F (36.6 C)-98.6 F (37 C)] 98.1 F (36.7 C) (10/18 0524) Pulse Rate:  [44-69] 62 (10/18 0700) Resp:  [10-27] 10 (10/18 0700) BP: (94-114)/(58-75) 114/71 (10/18 0700) SpO2:  [81 %-100 %] 99 % (10/18 0700) Weight:  [80.2 kg-81.6 kg] 80.2 kg (10/18 0500) Last BM Date : 08/20/22  Weight change: Filed Weights   08/20/22 0951 08/20/22 1237 08/21/22 0500  Weight: 81.6 kg 80.2 kg 80.2 kg    Intake/Output:   Intake/Output Summary (Last 24 hours) at 08/21/2022 0715 Last data filed at 08/21/2022 0600 Gross per 24 hour  Intake 931.81 ml  Output 2100 ml  Net -1168.19 ml      Physical Exam    CVP 10  General:  Well appearing. No resp difficulty HEENT: Normal Neck: Supple. JVP 9  cm. + Rt IJ Swan, + rt IJ tunneled CVC Carotids 2+ bilat; no bruits. No lymphadenopathy or thyromegaly appreciated. Cor: PMI nondisplaced. Regular rate & rhythm. No rubs, gallops or murmurs. Lungs:  Clear Abdomen: Soft, nontender, nondistended. No hepatosplenomegaly. No bruits or masses. Good bowel sounds. Extremities: No cyanosis, clubbing, rash, trace b/l LE edema Neuro: Alert & orientedx3, cranial nerves grossly intact. moves all 4 extremities w/o difficulty. Affect pleasant   Telemetry   NSR 60s w/ occasional PVCs   EKG    N/A   Labs    CBC Recent Labs    08/20/22 1001 08/20/22 1123 08/21/22 0514  WBC 4.8  --  5.5  HGB 10.2* 9.9*  10.9* 9.9*  HCT 33.4* 29.0*  32.0* 32.0*  MCV 83.9  --  84.0  PLT 195  --  678   Basic Metabolic Panel Recent Labs    08/20/22 1317 08/21/22 0514  NA 136 135  K 4.1 3.8  CL 98 95*  CO2 26 25  GLUCOSE 92 117*  BUN 22* 28*  CREATININE 1.83* 2.00*  CALCIUM 9.3 9.8   Liver Function Tests Recent Labs    08/19/22 1058  AST 25  ALT 28  ALKPHOS 75  BILITOT 1.9*  PROT 7.6  ALBUMIN 3.9   No results for input(s): "LIPASE", "AMYLASE" in the last 72 hours. Cardiac Enzymes No results for input(s): "CKTOTAL", "CKMB", "CKMBINDEX", "TROPONINI" in the last 72 hours.  BNP: BNP (last 3 results) Recent Labs    04/15/22 0933 06/24/22 1010 08/19/22  1058  BNP 1,220.8* 573.1* 715.1*    ProBNP (last 3 results) Recent Labs    11/30/21 1301  PROBNP 943.0*     D-Dimer No results for input(s): "DDIMER" in the last 72 hours. Hemoglobin A1C No results for input(s): "HGBA1C" in the last 72 hours. Fasting Lipid Panel No results for input(s): "CHOL", "HDL", "LDLCALC", "TRIG", "CHOLHDL", "LDLDIRECT" in the last 72 hours. Thyroid Function Tests No results for input(s): "TSH", "T4TOTAL", "T3FREE", "THYROIDAB" in the last 72 hours.  Invalid input(s): "FREET3"  Other results:   Imaging    DG CHEST PORT 1 VIEW  Result Date: 08/20/2022 CLINICAL DATA:  23762 Chronic systolic heart failure ([place]) [id number] EXAM: PORTABLE CHEST 1 VIEW COMPARISON:  04/15/2022 FINDINGS: Unchanged cardiomediastinal silhouette with postsurgical changes of  CABG. Unchanged AICD lead. Pulmonary artery catheter tip overlies the right pulmonary artery on the second image. Right neck angiocatheter tip overlies the internal jugular/brachiocephalic confluence. There is central pulmonary vascular congestion and mild interstitial opacities. No pleural effusion. No evidence of pneumothorax. IMPRESSION: Mild interstitial pulmonary edema. Pulmonary artery catheter tip overlies the right pulmonary artery. Right neck angiocatheter tip overlies the internal jugular/brachiocephalic confluence. Electronically Signed   By: Maurine Simmering M.D.   On: 08/20/2022 14:28   CARDIAC CATHETERIZATION  Result Date: 08/20/2022 Findings: On milrinone 0.25 mcg/kg/min RA = 7 RV = 71/13 PA = 70/20 (37) PCW = 25 (v = 38) Fick cardiac output/index = 5.3/2.6 Thermo CO/CI = 4.3/2.1 PVR = 2.3 (Fick) 2.8 (Thermo) Ao sat = 90% PA sat = 54%, 55% PAPi = 7.1 Assessment: 1. Markedly elevated filling pressures and pulmonary venous HTN with moderately reduced CO despite milrinone support Plan/Discussion: Admit to ICU. Leave swan in. Diurese. Titrate milrinone as needed. Plan Impella 5.5 on Thursday. Start heparin 2 hours after cath. Glori Bickers, MD 12:13 PM    Medications:     Scheduled Medications:  amiodarone  200 mg Oral Daily   atorvastatin  40 mg Oral Daily   Chlorhexidine Gluconate Cloth  6 each Topical Daily   sodium chloride flush  10-40 mL Intracatheter Q12H   sodium chloride flush  3 mL Intravenous Q12H   sodium chloride flush  3 mL Intravenous Q12H   sodium chloride flush  3 mL Intravenous Q12H    Infusions:  sodium chloride     sodium chloride     sodium chloride     sodium chloride 10 mL/hr at 08/21/22 0600   sodium chloride     heparin 1,300 Units/hr (08/21/22 0600)   milrinone 0.25 mcg/kg/min (08/21/22 0600)    PRN Medications: sodium chloride, sodium chloride, sodium chloride, acetaminophen, albuterol, melatonin, ondansetron (ZOFRAN) IV, sodium chloride flush,  sodium chloride flush, sodium chloride flush    Patient Profile   61 y/o male w/ 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 Once stable will transfer to [facility] for transplant.   Assessment/Plan   1. Acute on Chronic Biventricular Heart Failure: - NICM, ? Noncompaction. Has ICD  - followed closely by the [facility] (Dr. Posey Pronto) - Echo 3/23 ([facility]) EF < 15%, RV moderately reduced  - CXP 3/23 ([facility]) pVO2 15.9 (55%0 slope 34 RER 1.37 - RHC (6/23): with elevated left sided filling pressure PCWP 29 and low CO.  - Echo  06/24/22 EF < 20% G3DD RV severely decreased -  NYHA IV on home milrinone (0.25 mg/kg/min) - RHC 10/17 (on milrinone) w/ elevated left sided filling pressures PCWP 25 and marginal CI 2.1 - This morning on milrinone 0.25 mcg, w/ low co-ox of 50% and FICK CI 1.75. PAP elevated. PAPi ok. SVR 1286  - Increase milrinone to 0.375. Plan 5.5 Impella tomorrow - Continue IV Lasix 80 mg bid  - Off carvedilol due to low output.  - Off Entresto due to low BP.  - Off Spiro/Dig w/ AKI  - Consider restarting home Jardiance 10 mg daily  - ? 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 Mexiletine switched to amiodarone for better PVC suppression. PVCs now suppressed > 4 weeks with no improvement in EF - Blood type O pos. - Case d/w Dr. Posey Pronto at [facility]. We agree that Mr. Pankratz is end-stage. Once stablized w/ Impella support, will transfer to [facility] for transplant.      2. PAF - Remains in NSR - Holding Eliquis with upcoming 5.5 - Continue heparin gtt    3. H/o LM Coronary Dissection  - Iatrogenic during diagnostic LHC in 2007.  - s/p CABG.  - No chest pain    4. Pulmonary HTN - Echo RVSP 53 mmHg  - suspect combination WHO Groups 2&3 - RHC (6/23): RA 7, PA 54/28 (39), PCWP 29 (v 35), Fick CO/CI 3.4/1.6,  PVR 2.9 WU, PAPi 3.7, PA sat 53% - + RV Failure - Sildenafil recently discontinued due to hypotension. Now on milrinone. - RHC 10/17 showed pulmonary venous HTN, PVR 2.8, PAPi 7.1  - PA pressures elevated on swan. Continue IV Lasix and increase milrinone 0.375     5. Severe TR - Likely functional in setting of dilated RA/RV. - Continue diuretics.   6. OSA - Compliant w/ CPAP      7. Type 2DM - last Hgb A1c 7.0 - On Jardiance  - on statin, followed by PCP    8. AKI on CKD, Stage IIIa - Baseline SCr ~1.6 Most recently 2.1 likely cardio-renal - SCr 2.0 today - support CO w/ milrinone - monitor w/ diuresis    9. PVCs/NSVT - mexiletine switched to amiodarone. No improvement in EF with PVC suppression  - Continue amio for now  7. Insomnia - order sleep aid, d/w pharmacy   8. Nausea - LFTs ok on admit - plan PRN Zofran, QTc ok on EKG     Length of Stay: 1  Brittainy Simmons, PA-C  08/21/2022, 7:15 AM  Advanced Heart Failure Team Pager [phone] (M-F; 7a - 5p)  Please contact [facility] for night-coverage after hours (5p -7a ) and weekends on [email or web address]  Agree with above.   Remains on milrinone. On IV lasix. Co-ox low. Swan (TD) numbers also with low output. SVR ~1300. Scr up slightly. Feels nauseated. Denies CP or SOB   General:  Lying in bed. No resp difficulty HEENT: normal Neck: supple. RIJ swan. JVP to jaw . Carotids 2+ bilat; no bruits. No lymphadenopathy or thryomegaly appreciated. Cor: PMI laterally displaced. Regular rate & rhythm. + s3 Lungs: clear Abdomen: soft, nontender, nondistended. No hepatosplenomegaly. No bruits or masses. Good bowel sounds. Extremities: no cyanosis, clubbing, rash, tr edema Neuro: alert & orientedx3, cranial nerves grossly intact. moves all 4 extremities w/o difficulty. Affect pleasant  Remains very tenuous. Will increase milrinone to 0.375. Add low-dose NE. Continue diuresis. Plan Impella 5.5 tomorrow. Continue heparin.    CRITICAL CARE Performed by: Glori Bickers  Total critical care time: 45 minutes  Critical care time was exclusive  of separately billable procedures and treating other patients.  Critical care was necessary to treat or prevent imminent or life-threatening deterioration.  Critical care was time spent personally by me (independent of midlevel providers or residents) on the following activities: development of treatment plan with patient and/or surrogate as well as nursing, discussions with consultants, evaluation of patient's response to treatment, examination of patient, obtaining history from patient or surrogate, ordering and performing treatments and interventions, ordering and review of laboratory studies, ordering and review of radiographic studies, pulse oximetry and re-evaluation of patient's condition.  Glori Bickers, MD  9:26 AM

## 2022-08-21 NOTE — Progress Notes (Signed)
ANTICOAGULATION CONSULT NOTE - Follow Up Consult  Pharmacy Consult for heparin Indication: atrial fibrillation  No Known Allergies  Patient Measurements: Height: 6' (182.9 cm) Weight: 80.2 kg (176 lb 12.9 oz) IBW/kg (Calculated) : 77.6 Heparin Dosing Weight: 80 kg  Vital Signs: Temp: 98.1 F (36.7 C) (10/18 0524) BP: 114/71 (10/18 0700) Pulse Rate: 62 (10/18 0700)  Labs: Recent Labs    08/19/22 1058 08/20/22 1001 08/20/22 1123 08/20/22 1317 08/20/22 2158 08/21/22 0514  HGB 10.5* 10.2* 9.9*  10.9*  --   --  9.9*  HCT 33.1* 33.4* 29.0*  32.0*  --   --  32.0*  PLT 187 195  --   --   --  170  APTT  --   --   --   --  47* 59*  HEPARINUNFRC  --   --   --   --  0.51 0.54  CREATININE 2.35* 1.95*  --  1.83*  --  2.00*     Estimated Creatinine Clearance: 43.7 mL/min (A) (by C-G formula based on SCr of 2 mg/dL (H)).  Assessment: 61 y/o male with CHF class IV and afib on Eliquis PTA - last dose 10/16- which is on hold while awaiting Impella placement. Heparin was started post right heart cath 10/17, for Hx Afib currently in SR on amiodarone. PTT slightly lower than goal at 59sec but drawn early and may continue to rise heparin level 0.51 elevated from recent apixaban use  Goal of Therapy:  aPTT 66-102 seconds Monitor platelets by anticoagulation protocol: Yes   Plan:  Continue heparin infusion at 1300 units/hr Continue to monitor PTT, H&H, and platelets    Bonnita Nasuti Pharm.D. CPP, BCPS Clinical Pharmacist [phone] 08/21/2022 7:58 AM

## 2022-08-22 ENCOUNTER — Other Ambulatory Visit (HOSPITAL_COMMUNITY): Payer: Medicare Other

## 2022-08-22 ENCOUNTER — Inpatient Hospital Stay (HOSPITAL_COMMUNITY): Payer: Medicare Other

## 2022-08-22 ENCOUNTER — Encounter (HOSPITAL_COMMUNITY): Payer: Self-pay | Admitting: Internal Medicine

## 2022-08-22 ENCOUNTER — Inpatient Hospital Stay (HOSPITAL_COMMUNITY): Payer: Medicare Other | Admitting: Certified Registered Nurse Anesthetist

## 2022-08-22 ENCOUNTER — Encounter (HOSPITAL_COMMUNITY)
Admission: RE | Disposition: A | Discharge status: Other Acute Inpatient Hospital | Payer: Self-pay | Source: Ambulatory Visit | Attending: Internal Medicine

## 2022-08-22 DIAGNOSIS — I2510 Atherosclerotic heart disease of native coronary artery without angina pectoris: Secondary | ICD-10-CM

## 2022-08-22 DIAGNOSIS — I255 Ischemic cardiomyopathy: Secondary | ICD-10-CM

## 2022-08-22 DIAGNOSIS — I110 Hypertensive heart disease with heart failure: Secondary | ICD-10-CM

## 2022-08-22 DIAGNOSIS — I509 Heart failure, unspecified: Secondary | ICD-10-CM

## 2022-08-22 DIAGNOSIS — Z87891 Personal history of nicotine dependence: Secondary | ICD-10-CM

## 2022-08-22 DIAGNOSIS — I5023 Acute on chronic systolic (congestive) heart failure: Secondary | ICD-10-CM

## 2022-08-22 HISTORY — PX: PLACEMENT OF IMPELLA LEFT VENTRICULAR ASSIST DEVICE: SHX6519

## 2022-08-22 HISTORY — PX: TEE WITHOUT CARDIOVERSION: SHX5443

## 2022-08-22 LAB — ECHO INTRAOPERATIVE TEE
AV Mean grad: 5 mmHg
Height: 72 in
Weight: 2828.94 oz

## 2022-08-22 LAB — COOXEMETRY PANEL
Carboxyhemoglobin: 2.2 % — ABNORMAL HIGH (ref 0.5–1.5)
Carboxyhemoglobin: 2.6 % — ABNORMAL HIGH (ref 0.5–1.5)
Methemoglobin: <0.7 % (ref 0.0–1.5)
Methemoglobin: <0.7 % (ref 0.0–1.5)
O2 Saturation: 59.8 %
O2 Saturation: 63.3 %
Total hemoglobin: 11 g/dL — ABNORMAL LOW (ref 12.0–16.0)
Total hemoglobin: 10.6 g/dL — ABNORMAL LOW (ref 12.0–16.0)

## 2022-08-22 LAB — POCT I-STAT 7, (LYTES, BLD GAS, ICA,H+H)
Acid-Base Excess: 3 mmol/L — ABNORMAL HIGH (ref 0.0–2.0)
Acid-Base Excess: 3 mmol/L — ABNORMAL HIGH (ref 0.0–2.0)
Bicarbonate: 27.4 mmol/L (ref 20.0–28.0)
Bicarbonate: 28.1 mmol/L — ABNORMAL HIGH (ref 20.0–28.0)
Calcium, Ion: 1.2 mmol/L (ref 1.15–1.40)
Calcium, Ion: 1.2 mmol/L (ref 1.15–1.40)
HCT: 37 % — ABNORMAL LOW (ref 39.0–52.0)
HCT: 33 % — ABNORMAL LOW (ref 39.0–52.0)
Hemoglobin: 12.6 g/dL — ABNORMAL LOW (ref 13.0–17.0)
Hemoglobin: 11.2 g/dL — ABNORMAL LOW (ref 13.0–17.0)
O2 Saturation: 98 %
O2 Saturation: 99 %
Patient temperature: 98.1
Potassium: 4.3 mmol/L (ref 3.5–5.1)
Potassium: 4.1 mmol/L (ref 3.5–5.1)
Sodium: 132 mmol/L — ABNORMAL LOW (ref 135–145)
Sodium: 131 mmol/L — ABNORMAL LOW (ref 135–145)
TCO2: 29 mmol/L (ref 22–32)
TCO2: 29 mmol/L (ref 22–32)
pCO2 arterial: 41.8 mmHg (ref 32–48)
pCO2 arterial: 44.9 mmHg (ref 32–48)
pH, Arterial: 7.424 (ref 7.35–7.45)
pH, Arterial: 7.402 (ref 7.35–7.45)
pO2, Arterial: 109 mmHg — ABNORMAL HIGH (ref 83–108)
pO2, Arterial: 132 mmHg — ABNORMAL HIGH (ref 83–108)

## 2022-08-22 LAB — BASIC METABOLIC PANEL
Anion gap: 11 (ref 5–15)
BUN: 23 mg/dL — ABNORMAL HIGH (ref 6–20)
CO2: 28 mmol/L (ref 22–32)
Calcium: 9.7 mg/dL (ref 8.9–10.3)
Chloride: 94 mmol/L — ABNORMAL LOW (ref 98–111)
Creatinine, Ser: 1.91 mg/dL — ABNORMAL HIGH (ref 0.61–1.24)
GFR, Estimated: 40 mL/min — ABNORMAL LOW (ref >60)
Glucose, Bld: 118 mg/dL — ABNORMAL HIGH (ref 70–99)
Potassium: 4.3 mmol/L (ref 3.5–5.1)
Sodium: 133 mmol/L — ABNORMAL LOW (ref 135–145)

## 2022-08-22 LAB — CBC
HCT: 33.6 % — ABNORMAL LOW (ref 39.0–52.0)
Hemoglobin: 10.8 g/dL — ABNORMAL LOW (ref 13.0–17.0)
MCH: 26.3 pg (ref 26.0–34.0)
MCHC: 32.1 g/dL (ref 30.0–36.0)
MCV: 82 fL (ref 80.0–100.0)
Platelets: 171 10*3/uL (ref 150–400)
RBC: 4.1 MIL/uL — ABNORMAL LOW (ref 4.22–5.81)
RDW: 16.2 % — ABNORMAL HIGH (ref 11.5–15.5)
WBC: 6.4 10*3/uL (ref 4.0–10.5)
nRBC: 0 % (ref 0.0–0.2)

## 2022-08-22 LAB — ECHOCARDIOGRAM LIMITED
Height: 72 in
Weight: 2726.65 oz

## 2022-08-22 LAB — HEPARIN LEVEL (UNFRACTIONATED): Heparin Unfractionated: 0.41 IU/mL (ref 0.30–0.70)

## 2022-08-22 LAB — MAGNESIUM: Magnesium: 2.4 mg/dL (ref 1.7–2.4)

## 2022-08-22 LAB — GLUCOSE, CAPILLARY: Glucose-Capillary: 116 mg/dL — ABNORMAL HIGH (ref 70–99)

## 2022-08-22 SURGERY — INSERTION, CARDIAC ASSIST DEVICE, IMPELLA
Anesthesia: General

## 2022-08-22 MED ORDER — ACETAMINOPHEN 10 MG/ML IV SOLN
INTRAVENOUS | Status: AC
  Filled 2022-08-22: qty 100

## 2022-08-22 MED ORDER — MILRINONE LACTATE IN DEXTROSE 20-5 MG/100ML-% IV SOLN
INTRAVENOUS | Status: DC | PRN
  Administered 2022-08-22: .375 ug/kg/min via INTRAVENOUS

## 2022-08-22 MED ORDER — HEPARIN SODIUM (PORCINE) 1000 UNIT/ML IJ SOLN
INTRAMUSCULAR | Status: DC | PRN
  Administered 2022-08-22: 7000 [IU] via INTRAVENOUS
  Administered 2022-08-22: 3000 [IU] via INTRAVENOUS

## 2022-08-22 MED ORDER — OXYCODONE HCL 5 MG PO TABS
5.0000 mg | ORAL_TABLET | ORAL | Status: DC | PRN
  Administered 2022-08-22 – 2022-08-23 (×5): 5 mg via ORAL
  Filled 2022-08-22 (×5): qty 1

## 2022-08-22 MED ORDER — HEPARIN SODIUM (PORCINE) 5000 UNIT/ML IJ SOLN
INTRAVENOUS | Status: DC
  Filled 2022-08-22: qty 10

## 2022-08-22 MED ORDER — HEPARIN SODIUM (PORCINE) 1000 UNIT/ML IJ SOLN
INTRAMUSCULAR | Status: AC
  Filled 2022-08-22: qty 10

## 2022-08-22 MED ORDER — ACETAMINOPHEN 10 MG/ML IV SOLN
INTRAVENOUS | Status: DC | PRN
  Administered 2022-08-22: 1000 mg via INTRAVENOUS

## 2022-08-22 MED ORDER — ROCURONIUM BROMIDE 10 MG/ML (PF) SYRINGE
PREFILLED_SYRINGE | INTRAVENOUS | Status: AC
  Filled 2022-08-22: qty 10

## 2022-08-22 MED ORDER — MIDAZOLAM HCL (PF) 10 MG/2ML IJ SOLN
INTRAMUSCULAR | Status: AC
  Filled 2022-08-22: qty 2

## 2022-08-22 MED ORDER — LACTATED RINGERS IV SOLN: INTRAVENOUS | Status: DC | PRN

## 2022-08-22 MED ORDER — NOREPINEPHRINE 4 MG/250ML-% IV SOLN: 3.0000 ug/min | INTRAVENOUS | Status: DC

## 2022-08-22 MED ORDER — FENTANYL CITRATE (PF) 250 MCG/5ML IJ SOLN
INTRAMUSCULAR | Status: AC
  Filled 2022-08-22: qty 5

## 2022-08-22 MED ORDER — DEXTROSE 5 % IV SOLN
INTRAVENOUS | Status: AC | PRN
  Administered 2022-08-22: 1000 mL

## 2022-08-22 MED ORDER — ORAL CARE MOUTH RINSE: 15.0000 mL | Freq: Once | OROMUCOSAL | Status: AC

## 2022-08-22 MED ORDER — HEMOSTATIC AGENTS (NO CHARGE) OPTIME
TOPICAL | Status: DC | PRN
  Administered 2022-08-22 (×3): 1 via TOPICAL

## 2022-08-22 MED ORDER — SODIUM CHLORIDE 0.9% IV SOLUTION: INTRAVENOUS | Status: DC | PRN

## 2022-08-22 MED ORDER — CHLORHEXIDINE GLUCONATE 0.12 % MT SOLN
15.0000 mL | Freq: Once | OROMUCOSAL | Status: AC
  Administered 2022-08-22: 15 mL via OROMUCOSAL
  Filled 2022-08-22: qty 15

## 2022-08-22 MED ORDER — HEPARIN 6000 UNIT IRRIGATION SOLUTION
Status: DC | PRN
  Administered 2022-08-22: 1

## 2022-08-22 MED ORDER — SUGAMMADEX SODIUM 200 MG/2ML IV SOLN
INTRAVENOUS | Status: DC | PRN
  Administered 2022-08-22: 200 mg via INTRAVENOUS

## 2022-08-22 MED ORDER — ONDANSETRON HCL 4 MG/2ML IJ SOLN
INTRAMUSCULAR | Status: DC | PRN
  Administered 2022-08-22: 4 mg via INTRAVENOUS

## 2022-08-22 MED ORDER — SODIUM CHLORIDE 0.9 % IV SOLN
INTRAVENOUS | Status: DC | PRN
  Administered 2022-08-22: 1 ug/min via INTRAVENOUS

## 2022-08-22 MED ORDER — DEXAMETHASONE SODIUM PHOSPHATE 10 MG/ML IJ SOLN
INTRAMUSCULAR | Status: DC | PRN
  Administered 2022-08-22: 4 mg via INTRAVENOUS

## 2022-08-22 MED ORDER — ROCURONIUM BROMIDE 10 MG/ML (PF) SYRINGE
PREFILLED_SYRINGE | INTRAVENOUS | Status: DC | PRN
  Administered 2022-08-22: 80 mg via INTRAVENOUS
  Administered 2022-08-22 (×2): 20 mg via INTRAVENOUS

## 2022-08-22 MED ORDER — 0.9 % SODIUM CHLORIDE (POUR BTL) OPTIME
TOPICAL | Status: DC | PRN
  Administered 2022-08-22: 1000 mL

## 2022-08-22 MED ORDER — ETOMIDATE 2 MG/ML IV SOLN
INTRAVENOUS | Status: DC | PRN
  Administered 2022-08-22: 18 mg via INTRAVENOUS

## 2022-08-22 MED ORDER — FENTANYL CITRATE PF 50 MCG/ML IJ SOSY
50.0000 ug | PREFILLED_SYRINGE | INTRAMUSCULAR | Status: DC | PRN
  Administered 2022-08-22 – 2022-08-23 (×8): 50 ug via INTRAVENOUS
  Filled 2022-08-22 (×9): qty 1

## 2022-08-22 MED ORDER — LACTATED RINGERS IV SOLN: INTRAVENOUS | Status: DC

## 2022-08-22 MED ORDER — LIDOCAINE 2% (20 MG/ML) 5 ML SYRINGE
INTRAMUSCULAR | Status: AC
  Filled 2022-08-22: qty 5

## 2022-08-22 MED ORDER — ETOMIDATE 2 MG/ML IV SOLN
INTRAVENOUS | Status: AC
  Filled 2022-08-22: qty 10

## 2022-08-22 MED ORDER — FENTANYL CITRATE (PF) 250 MCG/5ML IJ SOLN
INTRAMUSCULAR | Status: DC | PRN
  Administered 2022-08-22: 50 ug via INTRAVENOUS
  Administered 2022-08-22: 250 ug via INTRAVENOUS

## 2022-08-22 MED ORDER — ONDANSETRON HCL 4 MG/2ML IJ SOLN
INTRAMUSCULAR | Status: AC
  Filled 2022-08-22: qty 2

## 2022-08-22 MED ORDER — HEPARIN 6000 UNIT IRRIGATION SOLUTION
Status: AC
  Filled 2022-08-22: qty 500

## 2022-08-22 MED ORDER — MIDAZOLAM HCL 2 MG/2ML IJ SOLN
INTRAMUSCULAR | Status: DC | PRN
  Administered 2022-08-22: 2 mg via INTRAVENOUS

## 2022-08-22 MED ORDER — DEXAMETHASONE SODIUM PHOSPHATE 10 MG/ML IJ SOLN
INTRAMUSCULAR | Status: AC
  Filled 2022-08-22: qty 1

## 2022-08-22 MED ORDER — EPINEPHRINE HCL 5 MG/250ML IV SOLN IN NS: 0.5000 ug/min | INTRAVENOUS | Status: DC

## 2022-08-22 MED ORDER — SODIUM CHLORIDE (PF) 0.9 % IJ SOLN
INTRAMUSCULAR | Status: AC
  Filled 2022-08-22: qty 20

## 2022-08-22 MED ORDER — NOREPINEPHRINE 4 MG/250ML-% IV SOLN
INTRAVENOUS | Status: DC | PRN
  Administered 2022-08-22: 3 ug/min via INTRAVENOUS

## 2022-08-22 SURGICAL SUPPLY — 66 items
APL SRG 7X2 LUM MLBL SLNT (VASCULAR PRODUCTS)
APPLICATOR TIP COSEAL (VASCULAR PRODUCTS) IMPLANT
BLADE CLIPPER SURG (BLADE) ×1 IMPLANT
BLADE SURG 12 STRL SS (BLADE) IMPLANT
CATH DIAG EXPO 6F AL1 (CATHETERS) ×1 IMPLANT
CATH DIAG EXPO 6F FR4 (CATHETERS) IMPLANT
CATH DIAG EXPO 6F VENT PIG 145 (CATHETERS) IMPLANT
CATH INFINITI 6F MPB2 (CATHETERS) ×1 IMPLANT
CLIP TI WIDE RED SMALL 24 (CLIP) IMPLANT
CONTAINER PROTECT SURGISLUSH (MISCELLANEOUS) ×2 IMPLANT
COVER READER SURGICOUNT DISP (BAG) IMPLANT
DRAPE C-ARM 42X72 X-RAY (DRAPES) ×2 IMPLANT
DRAPE CV SPLIT W-CLR ANES SCRN (DRAPES) ×1 IMPLANT
DRAPE INCISE IOBAN 66X45 STRL (DRAPES) IMPLANT
DRAPE PERI GROIN 82X75IN TIB (DRAPES) ×1 IMPLANT
DRAPE SLUSH/WARMER DISC (DRAPES) ×1 IMPLANT
DRSG TEGADERM 4X4.5 CHG (GAUZE/BANDAGES/DRESSINGS) IMPLANT
DRSG TEGADERM 4X4.75 (GAUZE/BANDAGES/DRESSINGS) IMPLANT
ELECT BLADE 4.0 EZ CLEAN MEGAD (MISCELLANEOUS) ×2
ELECTRODE BLDE 4.0 EZ CLN MEGD (MISCELLANEOUS) IMPLANT
GAUZE 4X4 16PLY ~~LOC~~+RFID DBL (SPONGE) ×1 IMPLANT
GAUZE SPONGE 4X4 12PLY STRL (GAUZE/BANDAGES/DRESSINGS) IMPLANT
GAUZE SPONGE 4X4 12PLY STRL LF (GAUZE/BANDAGES/DRESSINGS) IMPLANT
GAUZE XEROFORM 5X9 LF (GAUZE/BANDAGES/DRESSINGS) IMPLANT
GLOVE BIO SURGEON STRL SZ7.5 (GLOVE) IMPLANT
GLOVE BIOGEL M STRL SZ7.5 (GLOVE) ×3 IMPLANT
GOWN STRL REUS W/ TWL LRG LVL3 (GOWN DISPOSABLE) ×4 IMPLANT
GOWN STRL REUS W/TWL LRG LVL3 (GOWN DISPOSABLE) ×4
GRAFT HEMASHIELD 10MM (Graft) ×1 IMPLANT
GRAFT VASC STRG 30X10STRL (Graft) IMPLANT
HEMOSTAT SURGICEL 2X14 (HEMOSTASIS) IMPLANT
INSERT FOGARTY SM (MISCELLANEOUS) ×1 IMPLANT
KIT BASIN OR (CUSTOM PROCEDURE TRAY) ×1 IMPLANT
LOOP VESSEL MAXI BLUE (MISCELLANEOUS) IMPLANT
LOOP VESSEL MINI RED (MISCELLANEOUS) ×1 IMPLANT
NS IRRIG 1000ML POUR BTL (IV SOLUTION) ×4 IMPLANT
PACK CHEST (CUSTOM PROCEDURE TRAY) ×1 IMPLANT
PAD ARMBOARD 7.5X6 YLW CONV (MISCELLANEOUS) ×2 IMPLANT
PAD ELECT DEFIB RADIOL ZOLL (MISCELLANEOUS) ×1 IMPLANT
POWDER SURGICEL 3.0 GRAM (HEMOSTASIS) IMPLANT
PUMP SET IMPELLA 5.5 US (CATHETERS) ×1 IMPLANT
SEALANT SURG COSEAL 4ML (VASCULAR PRODUCTS) IMPLANT
SEALANT SURG COSEAL 8ML (VASCULAR PRODUCTS) ×1 IMPLANT
SPONGE T-LAP 18X18 ~~LOC~~+RFID (SPONGE) ×4 IMPLANT
SPONGE T-LAP 4X18 ~~LOC~~+RFID (SPONGE) ×1 IMPLANT
SUT ETHILON 3 0 PS 1 (SUTURE) ×2 IMPLANT
SUT PROLENE 5 0 C 1 36 (SUTURE) IMPLANT
SUT PROLENE 6 0 CC (SUTURE) IMPLANT
SUT SILK  1 MH (SUTURE) ×4
SUT SILK 1 MH (SUTURE) ×4 IMPLANT
SUT SILK 1 TIES 10X30 (SUTURE) ×1 IMPLANT
SUT SILK 2 0 (SUTURE) ×1
SUT SILK 2-0 18XBRD TIE 12 (SUTURE) IMPLANT
SUT SILK 3 0 (SUTURE) ×1
SUT SILK 3-0 18XBRD TIE 12 (SUTURE) IMPLANT
SUT VIC AB 1 CT1 27 (SUTURE) ×1
SUT VIC AB 1 CT1 27XBRD ANTBC (SUTURE) IMPLANT
SUT VIC AB 1 CTX 18 (SUTURE) IMPLANT
SUT VIC AB 3-0 X1 27 (SUTURE) IMPLANT
TOWEL GREEN STERILE (TOWEL DISPOSABLE) ×1 IMPLANT
TOWEL GREEN STERILE FF (TOWEL DISPOSABLE) ×1 IMPLANT
TRAY FOLEY SLVR 16FR TEMP STAT (SET/KITS/TRAYS/PACK) ×1 IMPLANT
TUBE CONNECTING 20X1/4 (TUBING) IMPLANT
WATER STERILE IRR 1000ML POUR (IV SOLUTION) ×2 IMPLANT
WIRE EMERALD 3MM-J .035X260CM (WIRE) IMPLANT
YANKAUER SUCT BULB TIP NO VENT (SUCTIONS) IMPLANT

## 2022-08-22 NOTE — Anesthesia Postprocedure Evaluation (Signed)
Anesthesia Post Note  Patient: Phineas Mcenroe  Procedure(s) Performed: PLACEMENT OF IMPELLA 5.5 LEFT VENTRICULAR ASSIST DEVICE TRANSESOPHAGEAL ECHOCARDIOGRAM (TEE)     Patient location during evaluation: SICU Anesthesia Type: General Level of consciousness: patient cooperative, oriented and sedated Pain management: pain level controlled Vital Signs Assessment: post-procedure vital signs reviewed and stable Respiratory status: nonlabored ventilation, spontaneous breathing, respiratory function stable and patient connected to nasal cannula oxygen Cardiovascular status: stable (on milrinone, Impella) Postop Assessment: no apparent nausea or vomiting Anesthetic complications: no   No notable events documented.  Last Vitals:  Vitals:   08/22/22 1600 08/22/22 1700  BP:    Pulse: (!) 58 (!) 57  Resp: 14 12  Temp: (!) 36.3 C (!) 36.3 C  SpO2: 96% 95%    Last Pain:  Vitals:   08/22/22 1649  TempSrc:   PainSc: Asleep                 Laina Guerrieri,E. Jamina Macbeth

## 2022-08-22 NOTE — Final Progress Note (Signed)
Pre Procedure note for inpatients:   Bruce Robertson has been scheduled for Procedure(s): PLACEMENT OF IMPELLA 5.5 LEFT VENTRICULAR ASSIST DEVICE (N/A) TRANSESOPHAGEAL ECHOCARDIOGRAM (TEE) (N/A) today. The various methods of treatment have been discussed with the patient. After consideration of the risks, benefits and treatment options the patient has consented to the planned procedure.   The patient has been seen and labs reviewed. There are no changes in the patient's condition to prevent proceeding with the planned procedure today.  Recent labs:  Lab Results  Component Value Date   WBC 6.4 08/22/2022   HGB 10.8 (L) 08/22/2022   HCT 33.6 (L) 08/22/2022   PLT 171 08/22/2022   GLUCOSE 118 (H) 08/22/2022   CHOL 141 11/30/2021   TRIG 82.0 11/30/2021   HDL 48.90 11/30/2021   LDLDIRECT 166.4 05/17/2010   LDLCALC 76 11/30/2021   ALT 28 08/19/2022   AST 25 08/19/2022   NA 133 (L) 08/22/2022   K 4.3 08/22/2022   CL 94 (L) 08/22/2022   CREATININE 1.91 (H) 08/22/2022   BUN 23 (H) 08/22/2022   CO2 28 08/22/2022   TSH 5.424 (H) 08/21/2022   INR 1.3 (H) 08/21/2022   HGBA1C 7.0 (H) 04/15/2022   MICROALBUR <0.7 11/30/2021    Dahlia Byes, MD 08/22/2022 8:32 AM

## 2022-08-22 NOTE — Progress Notes (Signed)
ANTICOAGULATION CONSULT NOTE - Follow Up Consult  Pharmacy Consult for heparin Indication: atrial fibrillation  No Known Allergies  Patient Measurements: Height: 6' (182.9 cm) Weight: 80.2 kg (176 lb 12.9 oz) IBW/kg (Calculated) : 77.6 Heparin Dosing Weight: 80 kg  Vital Signs: Temp: 98.6 F (37 C) (10/19 0700) Temp Source: Core (10/19 0000) BP: 114/72 (10/19 0700) Pulse Rate: 68 (10/19 0700)  Labs: Recent Labs    08/20/22 1001 08/20/22 1123 08/20/22 1317 08/20/22 2158 08/21/22 0514 08/21/22 1616 08/22/22 0427  HGB 10.2* 9.9*  10.9*  --   --  9.9*  --  10.8*  HCT 33.4* 29.0*  32.0*  --   --  32.0*  --  33.6*  PLT 195  --   --   --  170  --  171  APTT  --   --   --  47* 59*  --   --   LABPROT  --   --   --   --   --  15.7*  --   INR  --   --   --   --   --  1.3*  --   HEPARINUNFRC  --   --   --  0.51 0.54  --  0.41  CREATININE 1.95*  --  1.83*  --  2.00*  --  1.91*     Estimated Creatinine Clearance: 45.7 mL/min (A) (by C-G formula based on SCr of 1.91 mg/dL (H)).  Assessment: 61 y/o male with CHF class IV and afib on Eliquis PTA - last dose 10/16- which is on hold while awaiting Impella placement.  Heparin was started post right heart cath 10/17, for Hx Afib currently in SR on amiodarone.   Heparin level in goal range today at 0.41.  No overt bleeding or complications noted.  Heparin turned off this AM for OR for Impella placement.  Goal of Therapy:  aPTT 66-102 seconds Monitor platelets by anticoagulation protocol: Yes   Plan:  F/u plans to resume heparin eventually s/p OR.  [place] Crane, Roylene Reason, BCCP Clinical Pharmacist  08/22/2022 7:30 AM   [facility] pharmacy phone numbers are listed on [email or web address]

## 2022-08-22 NOTE — Anesthesia Procedure Notes (Signed)
Arterial Line Insertion Start/End10/19/2023 8:05 AM, 08/22/2022 8:10 AM Performed by: Betha Loa, CRNA, CRNA  Patient location: Pre-op. Preanesthetic checklist: patient identified, IV checked, site marked, risks and benefits discussed, surgical consent, monitors and equipment checked, pre-op evaluation, timeout performed and anesthesia consent Lidocaine 1% used for infiltration Left, radial was placed Catheter size: 20 G Hand hygiene performed  and maximum sterile barriers used   Attempts: 1 Procedure performed without using ultrasound guided technique. Following insertion, Biopatch and dressing applied. Post procedure assessment: normal

## 2022-08-22 NOTE — Progress Notes (Addendum)
Advanced Heart Failure Rounding Note  PCP-Cardiologist: Minus Breeding, MD  AHF: Dr. Haroldine Laws   Subjective:    10/17: RHC (on Milrinone 0.25) markedly elevated filling pressures and pulmonary venous HTN with moderately reduced CO, CI 2.1.  10/18: Milrinone increased 0.375 + NE 3 added (fixed dose)  This morning, remains on milrinone 0.375 + NE 3. Co-ox better, 50>>60%. CI 2.3   Swan #s PAP: (50-79)/(17-42) 50/17 CVP:  [2 mmHg-10 mmHg] 5 mmHg PCWP:  [28 mmHg-30 mmHg] 30 mmHg CO:  [3.5 L/min-4.5 L/min] 4.5 L/min CI:  [1.7 L/min/m2-2.3 L/min/m2] 2.3 L/min/m2 Co-ox 60% FICK CI 2.1 PAPi 6.6 SVR 1440   Brisk diuresis yesterday, 5L in UOP. CVP 5   SCr 1.95>>1.83>>2.00>>1.91  K 4.3  Slept better last night. Still w/ mild nausea but overall improved. No current resting dyspnea or CP. Going for Impella today.   RHC Findings:   On milrinone 0.25 mcg/kg/min   RA = 7 RV = 71/13 PA = 70/20 (37) PCW = 25 (v = 38) Fick cardiac output/index = 5.3/2.6 Thermo CO/CI = 4.3/2.1 PVR = 2.3 (Fick) 2.8 (Thermo) Ao sat = 90% PA sat = 54%, 55% PAPi = 7.1  Objective:   Weight Range: 80.2 kg Body mass index is 23.98 kg/m.   Vital Signs:   Temp:  [98.1 F (36.7 C)-99.7 F (37.6 C)] 98.6 F (37 C) (10/19 0700) Pulse Rate:  [55-69] 68 (10/19 0700) Resp:  [12-24] 12 (10/19 0700) BP: (95-129)/(54-82) 114/72 (10/19 0700) SpO2:  [87 %-100 %] 95 % (10/19 0700) Last BM Date : 08/21/22  Weight change: Filed Weights   08/20/22 0951 08/20/22 1237 08/21/22 0500  Weight: 81.6 kg 80.2 kg 80.2 kg    Intake/Output:   Intake/Output Summary (Last 24 hours) at 08/22/2022 0716 Last data filed at 08/22/2022 0630 Gross per 24 hour  Intake 1264.76 ml  Output 5005 ml  Net -3740.24 ml      Physical Exam    CVP 5  General:  Well appearing. No resp difficulty HEENT: Normal Neck: Supple. JVP 6 cm. + Rt IJ Swan, + rt IJ tunneled CVC Carotids 2+ bilat; no bruits. No lymphadenopathy or  thyromegaly appreciated. Cor: PMI nondisplaced. Regular rate & rhythm. No rubs, gallops or murmurs. Lungs: Clear, no wheezing  Abdomen: Soft, nontender, nondistended. No hepatosplenomegaly. No bruits or masses. Good bowel sounds. Extremities: No cyanosis, clubbing, rash, no edema  Neuro: Alert & orientedx3, cranial nerves grossly intact. moves all 4 extremities w/o difficulty. Affect pleasant   Telemetry   NSR 60s w/ occasional PVCs   EKG    N/A   Labs    CBC Recent Labs    08/21/22 0514 08/22/22 0427  WBC 5.5 6.4  HGB 9.9* 10.8*  HCT 32.0* 33.6*  MCV 84.0 82.0  PLT 170 659   Basic Metabolic Panel Recent Labs    08/21/22 0514 08/22/22 0427  NA 135 133*  K 3.8 4.3  CL 95* 94*  CO2 25 28  GLUCOSE 117* 118*  BUN 28* 23*  CREATININE 2.00* 1.91*  CALCIUM 9.8 9.7   Liver Function Tests Recent Labs    08/19/22 1058  AST 25  ALT 28  ALKPHOS 75  BILITOT 1.9*  PROT 7.6  ALBUMIN 3.9   No results for input(s): "LIPASE", "AMYLASE" in the last 72 hours. Cardiac Enzymes No results for input(s): "CKTOTAL", "CKMB", "CKMBINDEX", "TROPONINI" in the last 72 hours.  BNP: BNP (last 3 results) Recent Labs    04/15/22 0933  06/24/22 1010 08/19/22 1058  BNP 1,220.8* 573.1* 715.1*    ProBNP (last 3 results) Recent Labs    11/30/21 1301  PROBNP 943.0*     D-Dimer No results for input(s): "DDIMER" in the last 72 hours. Hemoglobin A1C No results for input(s): "HGBA1C" in the last 72 hours. Fasting Lipid Panel No results for input(s): "CHOL", "HDL", "LDLCALC", "TRIG", "CHOLHDL", "LDLDIRECT" in the last 72 hours. Thyroid Function Tests Recent Labs    08/21/22 1616  TSH 5.424*    Other results:   Imaging    No results found.   Medications:     Scheduled Medications:  amiodarone  200 mg Oral Daily   atorvastatin  40 mg Oral Daily   Chlorhexidine Gluconate Cloth  6 each Topical Daily   Chlorhexidine Gluconate Cloth  6 each Topical Q6H    furosemide  80 mg Intravenous BID   sodium chloride flush  10-40 mL Intracatheter Q12H   sodium chloride flush  3 mL Intravenous Q12H   sodium chloride flush  3 mL Intravenous Q12H   sodium chloride flush  3 mL Intravenous Q12H   traZODone  50 mg Oral QHS    Infusions:  sodium chloride     sodium chloride     sodium chloride     sodium chloride 10 mL/hr at 08/22/22 0100   sodium chloride      ceFAZolin (ANCEF) IV     heparin 50 units/mL (Impella PURGE) in dextrose 5% 1000 mL     milrinone 0.375 mcg/kg/min (08/22/22 0100)   norepinephrine (LEVOPHED) Adult infusion 3 mcg/min (08/22/22 0100)    PRN Medications: sodium chloride, sodium chloride, sodium chloride, acetaminophen, albuterol, melatonin, menthol-cetylpyridinium, ondansetron (ZOFRAN) IV, sodium chloride flush, sodium chloride flush, sodium chloride flush    Patient Profile   61 y/o male w/ 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 Once stable will transfer to [facility] for transplant.   Assessment/Plan   1. Acute on Chronic Biventricular Heart Failure: - NICM, ? Noncompaction. Has ICD  - followed closely by the [facility] (Dr. Posey Pronto) - Echo 3/23 ([facility]) EF < 15%, RV moderately reduced  - CXP 3/23 ([facility]) pVO2 15.9 (55%0 slope 34 RER 1.37 - RHC (6/23): with elevated left sided filling pressure PCWP 29 and low CO.  - Echo  06/24/22 EF < 20% G3DD RV severely decreased - NYHA IV on home milrinone (0.25 mg/kg/min) - RHC 10/17 (on milrinone) w/ elevated left sided filling pressures PCWP 25 and marginal CI 2.1. Diuresed w/ IV Lasix  - now on milrinone 0.375 + NE 5. Co-ox 60%, CI 2.3, CVP 5  - Plan 5.5 Impella today  - Off carvedilol due to low output.  - Off Entresto due to low BP.  - Off Spiro/Dig/Jardiance w/ AKI  - ? 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 Mexiletine switched to amiodarone for better PVC suppression. PVCs now suppressed > 4 weeks with no improvement in EF - Blood type O pos. - Case d/w Dr. Posey Pronto at [facility]. We agree that Mr. Lekas is end-stage. Once stablized w/ Impella support, will transfer to [facility] for transplant.      2. PAF - Remains in NSR - Holding Eliquis with upcoming 5.5 - Continue heparin gtt    3. H/o LM Coronary Dissection  - Iatrogenic during diagnostic LHC in 2007.  -  s/p CABG.  - No chest pain    4. Pulmonary HTN - Echo RVSP 53 mmHg  - suspect combination WHO Groups 2&3 - RHC (6/23): RA 7, PA 54/28 (39), PCWP 29 (v 35), Fick CO/CI 3.4/1.6, PVR 2.9 WU, PAPi 3.7, PA sat 53% - + RV Failure - Sildenafil recently discontinued due to hypotension. Now on milrinone. - RHC 10/17 showed pulmonary venous HTN, PVR 2.8, PAPi 7.1  - Continue milrinone 0.375 and diuresis    5. Severe TR - Likely functional in setting of dilated RA/RV. - Continue diuretics.   6. OSA - Compliant w/ CPAP      7. Type 2DM - last Hgb A1c 7.0 - On Jardiance  - on statin, followed by PCP    8. AKI on CKD, Stage IIIa - Baseline SCr ~1.6 Most recently 2.1 likely cardio-renal - SCr 1.9 today - support CO w/ milrinone + Impella  - monitor w/ diuresis    9. PVCs/NSVT - mexiletine switched to amiodarone. No improvement in EF with PVC suppression  - Continue amio for now  7. Insomnia - improved w/ sleep aid  8. Nausea - PRN Zofran    Length of Stay: 2  Lyda Jester, PA-C  08/22/2022, 7:16 AM  Advanced Heart Failure Team Pager [phone] (M-F; 7a - 5p)  Please contact [facility] for night-coverage after hours (5p -7a ) and weekends on [email or web address]  Agree with above. Has responded well to increase in milrinone and addition of NE>. Scr improved. Good diuresis. Filling pressures still high.   Patient taken to OR this am and underwent Impella 5.5 placement. Prior to placement. TEE reviewed and  showed moderate to severe AI. Given limited options we made decision to proceed with Impella placement (case d/w [facility]).   I assisted in OR to position Impella 5.5.  Now back in CCU  General:  Intubated sedated HEENT: normal Neck: supple. RIJ swan  Carotids 2+ bilat; no bruits. No lymphadenopathy or thryomegaly appreciated. Cor: PMI nondisplaced. Regular rate & rhythm. Impella site ok  Lungs: clear Abdomen: soft, nontender, nondistended. No hepatosplenomegaly. No bruits or masses. Good bowel sounds. Extremities: no cyanosis, clubbing, rash, edema Neuro: intubated sedated.   Remains tenuous but now s/p Impella 5.5 placement. Flows look good. Will reassess placement with Echo. Adjust drips based on hemodynamics. If improving, transfer to [facility] on Monday for possible transplant.   CRITICAL CARE Performed by: Glori Bickers  Total critical care time: 60 minutes  Critical care time was exclusive of separately billable procedures and treating other patients.  Critical care was necessary to treat or prevent imminent or life-threatening deterioration.  Critical care was time spent personally by me (independent of midlevel providers or residents) on the following activities: development of treatment plan with patient and/or surrogate as well as nursing, discussions with consultants, evaluation of patient's response to treatment, examination of patient, obtaining history from patient or surrogate, ordering and performing treatments and interventions, ordering and review of laboratory studies, ordering and review of radiographic studies, pulse oximetry and re-evaluation of patient's condition.  Glori Bickers, MD  1:40 PM

## 2022-08-22 NOTE — Progress Notes (Signed)
ANTICOAGULATION CONSULT NOTE - Follow Up Consult  Pharmacy Consult for heparin Indication: atrial fibrillation  No Known Allergies  Patient Measurements: Height: 6' (182.9 cm) Weight: 77.3 kg (170 lb 6.7 oz) IBW/kg (Calculated) : 77.6 Heparin Dosing Weight: 80 kg  Vital Signs: Temp: 97.7 F (36.5 C) (10/19 1315) Temp Source: Core (10/19 1315) BP: 114/72 (10/19 0700) Pulse Rate: 65 (10/19 1315)  Labs: Recent Labs    08/20/22 1001 08/20/22 1123 08/20/22 1317 08/20/22 2158 08/21/22 0514 08/21/22 1616 08/22/22 0427  HGB 10.2* 9.9*  10.9*  --   --  9.9*  --  10.8*  HCT 33.4* 29.0*  32.0*  --   --  32.0*  --  33.6*  PLT 195  --   --   --  170  --  171  APTT  --   --   --  47* 59*  --   --   LABPROT  --   --   --   --   --  15.7*  --   INR  --   --   --   --   --  1.3*  --   HEPARINUNFRC  --   --   --  0.51 0.54  --  0.41  CREATININE 1.95*  --  1.83*  --  2.00*  --  1.91*     Estimated Creatinine Clearance: 45.5 mL/min (A) (by C-G formula based on SCr of 1.91 mg/dL (H)).  Assessment: 62 y/o male with CHF class IV and afib on Eliquis PTA - last dose 10/16- which is on hold while awaiting Impella placement.  Heparin was started post right heart cath 10/17, for Hx Afib currently in SR on amiodarone.   Heparin level in goal range today at 0.41.  No overt bleeding or complications noted.  Heparin turned off this AM for OR for Impella placement.  Now s/p OR with Impella in place, heparin purge running 50 units/mL at 10.4 ml/hr = 520 units/hr of heparin.  Goal of Therapy:  aPTT 66-102 seconds Monitor platelets by anticoagulation protocol: Yes   Plan:  Discussed with Dr. Prescott Gum, will continue heparin only in Impella purge solution for tonight. Will likely increase to therapeutic drip in the morning.  [place] Crane, Roylene Reason, BCCP Clinical Pharmacist  08/22/2022 1:49 PM   [facility] pharmacy phone numbers are listed on [email or web address]

## 2022-08-22 NOTE — Progress Notes (Signed)
  Echocardiogram Echocardiogram Transesophageal has been performed.  Bruce Robertson 08/22/2022, 10:20 AM

## 2022-08-22 NOTE — Op Note (Signed)
NAME: Bruce Robertson, Bruce Robertson MEDICAL RECORD NO: [id number] ACCOUNT NO: [id number] DATE OF BIRTH: 1961-04-28 FACILITY: [facility] LOCATION: [facility] PHYSICIAN: Len Childs, MD            Glori Bickers MD Operative Report   DATE OF PROCEDURE: 08/22/2022  OPERATION:  Placement of Impella 5.5 left ventricular assist device.  SURGEON:  Tharon Aquas Trigt III, MD  PREOPERATIVE DIAGNOSES:  Ischemic cardiomyopathy, ejection fraction 15%, low cardiac output by right heart catheterization.  POSTOPERATIVE DIAGNOSES:  Ischemic cardiomyopathy, ejection fraction 15%, low cardiac output by right heart catheterization.  ANESTHESIA:  General.  CLINICAL NOTE:  The patient is a 61 year old gentleman status post emergency CABG x2 in 2007 for left main disease.  He has been followed for several years by the advanced heart failure service and now he has decompensated heart failure with low EF and  low cardiac output.  He is being evaluated for cardiac transplantation and in order to optimize his end-organ perfusion and improve renal function an Impella 5.5 assist device was recommended by his cardiologist.  I discussed the procedure of Impella  placement with the patient including the expected benefits, the potential risks, and the alternative therapies.  He demonstrated his understanding and agreed to proceed with surgery.  DESCRIPTION OF PROCEDURE:  The patient was brought from the ICU where he was on IV inotropic support with a PA catheter and brought directly to the preoperative area.  Informed consent was documented and final issues regarding the procedure were  discussed with the patient.  The patient was then brought directly to the operating room.  He was placed supine on the OR table and general anesthesia was induced under invasive hemodynamic monitoring.  He remained stable.  A transesophageal echo probe  was placed by the anesthesia team.  The patient's chest and neck was prepped and draped as a sterile  field.  A proper timeout was performed.  An incision was made beneath the right clavicle.  Dissection was carried through the pectoralis major muscle in a muscle splitting incision.  The  pectoralis minor was swept to the side.  The fat pad was entered and the subclavian vessels were identified.  The subclavian artery was dissected from the veins and from the brachial plexus and encircled with a vessel loop.  Heparin 7000 units was  administered.  Vascular clamp was placed on the axillary artery.  A 10 mm Hemashield graft was then sewn end-to-side using running 5-0 Prolene.  The suture line was reinforced with a light layer of CoSeal.  There was good flow through the graft with  brief release of the clamp.  The graft was then brought out through the main incision through his smaller lower incision.  The introducer plug from the Impella kit was then placed in the graft and secured with clips.  Under fluoroscopy, a J-wire was passed through the anastomosis and down to the aortic root.  At this point, placement of the further wires and catheters will be dictated by Dr. Haroldine Laws who was present for the entire portion of the catheter insertion to  the graft.  After the catheter was found to be in good position and was actuated and produced good flow the introducer was exchanged for the permanent sheath, which was secured with several heavy silk ties.  The sheath was secured to the skin with 4 silk sutures.  The main incision was then closed in layers using interrupted #1 Vicryl for the muscle layer, running 2-0 Vicryl for the  subcutaneous and a running subcuticular suture for the skin.  Sterile dressings were placed.  The patient was reversed from  anesthesia, extubated and returned to the ICU in stable condition.  The Impella placement was monitored and documented to be in proper position with TEE.  There was felt to be moderate-severe aortic insufficiency, but did not appear there was any worse  after  placement of the Impella 5.5.   PUS D: 08/22/2022 2:22:36 pm T: 08/22/2022 6:57:00 pm  JOB: [id number]/ [id number]

## 2022-08-22 NOTE — Progress Notes (Signed)
  Echocardiogram 2D Echocardiogram limited has been performed.  Darlina Sicilian M 08/22/2022, 2:28 PM

## 2022-08-22 NOTE — Brief Op Note (Signed)
08/22/2022  1:05 PM  PATIENT:  Bruce Robertson  61 y.o. male  PRE-OPERATIVE DIAGNOSIS:  Ischemic Cardiomyopathy  POST-OPERATIVE DIAGNOSIS:  Ischemic Cardiomyopathy  PROCEDURE:  Procedure(s): PLACEMENT OF IMPELLA 5.5 LEFT VENTRICULAR ASSIST DEVICE (N/A) TRANSESOPHAGEAL ECHOCARDIOGRAM (TEE) (N/A)  SURGEON:  Surgeon(s) and Role:    Dahlia Byes, MD - Primary  PHYSICIAN ASSISTANT:   ASSISTANTS: RNFA  TO W   ANESTHESIA:   general  EBL:  200 mL   BLOOD ADMINISTERED:none  DRAINS: none   LOCAL MEDICATIONS USED:  NONE  SPECIMEN:  No Specimen  DISPOSITION OF SPECIMEN:  N/A  COUNTS:  YES  TOURNIQUET:  * No tourniquets in log *  DICTATION: .Dragon Dictation  PLAN OF CARE:  return to ICU  PATIENT DISPOSITION:  ICU - extubated and stable.   Delay start of Pharmacological VTE agent (>24hrs) due to surgical blood loss or risk of bleeding: yes

## 2022-08-22 NOTE — Transfer of Care (Signed)
Immediate Anesthesia Transfer of Care Note  Patient: Bruce Robertson  Procedure(s) Performed: PLACEMENT OF IMPELLA 5.5 LEFT VENTRICULAR ASSIST DEVICE TRANSESOPHAGEAL ECHOCARDIOGRAM (TEE)  Patient Location: SICU  Anesthesia Type:General  Level of Consciousness: patient cooperative and responds to stimulation  Airway & Oxygen Therapy: Patient Spontanous Breathing and Patient connected to nasal cannula oxygen  Post-op Assessment: Report given to RN, Post -op Vital signs reviewed and stable, and Patient moving all extremities X 4  Post vital signs: Reviewed and stable  Last Vitals:  Vitals Value Taken Time  BP 110/61 1312  Temp    Pulse 66 08/22/22 1311  Resp 13 08/22/22 1311  SpO2 97 % 08/22/22 1311  Vitals shown include unvalidated device data.  Last Pain:  Vitals:   08/22/22 0758  TempSrc:   PainSc: 0-No pain      Patients Stated Pain Goal: 0 (35/36/14 4315)  Complications: No notable events documented.

## 2022-08-23 ENCOUNTER — Encounter (HOSPITAL_COMMUNITY): Payer: Self-pay | Admitting: Cardiothoracic Surgery

## 2022-08-23 ENCOUNTER — Encounter (HOSPITAL_COMMUNITY): Payer: Medicare Other | Admitting: Internal Medicine

## 2022-08-23 LAB — LACTATE DEHYDROGENASE: LDH: 264 U/L — ABNORMAL HIGH (ref 98–192)

## 2022-08-23 LAB — BASIC METABOLIC PANEL
Anion gap: 14 (ref 5–15)
BUN: 26 mg/dL — ABNORMAL HIGH (ref 6–20)
CO2: 26 mmol/L (ref 22–32)
Calcium: 9.7 mg/dL (ref 8.9–10.3)
Chloride: 92 mmol/L — ABNORMAL LOW (ref 98–111)
Creatinine, Ser: 1.68 mg/dL — ABNORMAL HIGH (ref 0.61–1.24)
GFR, Estimated: 47 mL/min — ABNORMAL LOW (ref >60)
Glucose, Bld: 142 mg/dL — ABNORMAL HIGH (ref 70–99)
Potassium: 4.8 mmol/L (ref 3.5–5.1)
Sodium: 132 mmol/L — ABNORMAL LOW (ref 135–145)

## 2022-08-23 LAB — MAGNESIUM: Magnesium: 2.4 mg/dL (ref 1.7–2.4)

## 2022-08-23 LAB — COOXEMETRY PANEL
Carboxyhemoglobin: 2.2 % — ABNORMAL HIGH (ref 0.5–1.5)
Methemoglobin: <0.7 % (ref 0.0–1.5)
O2 Saturation: 63.8 %
Total hemoglobin: 10.6 g/dL — ABNORMAL LOW (ref 12.0–16.0)

## 2022-08-23 LAB — CBC
HCT: 31.7 % — ABNORMAL LOW (ref 39.0–52.0)
Hemoglobin: 10.2 g/dL — ABNORMAL LOW (ref 13.0–17.0)
MCH: 25.9 pg — ABNORMAL LOW (ref 26.0–34.0)
MCHC: 32.2 g/dL (ref 30.0–36.0)
MCV: 80.5 fL (ref 80.0–100.0)
Platelets: 180 10*3/uL (ref 150–400)
RBC: 3.94 MIL/uL — ABNORMAL LOW (ref 4.22–5.81)
RDW: 15.9 % — ABNORMAL HIGH (ref 11.5–15.5)
WBC: 12.2 10*3/uL — ABNORMAL HIGH (ref 4.0–10.5)
nRBC: 0 % (ref 0.0–0.2)

## 2022-08-23 LAB — POCT I-STAT 7, (LYTES, BLD GAS, ICA,H+H)
Acid-Base Excess: 3 mmol/L — ABNORMAL HIGH (ref 0.0–2.0)
Bicarbonate: 27.6 mmol/L (ref 20.0–28.0)
Calcium, Ion: 1.22 mmol/L (ref 1.15–1.40)
HCT: 35 % — ABNORMAL LOW (ref 39.0–52.0)
Hemoglobin: 11.9 g/dL — ABNORMAL LOW (ref 13.0–17.0)
O2 Saturation: 96 %
Patient temperature: 36.7
Potassium: 4.7 mmol/L (ref 3.5–5.1)
Sodium: 127 mmol/L — ABNORMAL LOW (ref 135–145)
TCO2: 29 mmol/L (ref 22–32)
pCO2 arterial: 38.5 mmHg (ref 32–48)
pH, Arterial: 7.462 — ABNORMAL HIGH (ref 7.35–7.45)
pO2, Arterial: 75 mmHg — ABNORMAL LOW (ref 83–108)

## 2022-08-23 LAB — HEPARIN LEVEL (UNFRACTIONATED)
Heparin Unfractionated: <0.1 IU/mL — ABNORMAL LOW (ref 0.30–0.70)
Heparin Unfractionated: <0.1 IU/mL — ABNORMAL LOW (ref 0.30–0.70)

## 2022-08-23 MED ORDER — MILRINONE LACTATE IN DEXTROSE 20-5 MG/100ML-% IV SOLN
0.5000 ug/kg/min | INTRAVENOUS | Status: DC
  Administered 2022-08-23: 0.5 ug/kg/min via INTRAVENOUS
  Filled 2022-08-23 (×2): qty 100

## 2022-08-23 MED ORDER — HEPARIN (PORCINE) 25000 UT/250ML-% IV SOLN
600.0000 [IU]/h | INTRAVENOUS | Status: DC
  Administered 2022-08-23: 500 [IU]/h via INTRAVENOUS
  Filled 2022-08-23: qty 250

## 2022-08-23 MED ORDER — FUROSEMIDE 10 MG/ML IJ SOLN
40.0000 mg | Freq: Once | INTRAMUSCULAR | Status: AC
  Administered 2022-08-23: 40 mg via INTRAVENOUS
  Filled 2022-08-23: qty 4

## 2022-08-23 NOTE — Discharge Summary (Incomplete)
Advanced Heart Failure Team  Discharge Summary   Patient ID: Bruce Robertson MRN: [id number], DOB/AGE: January 09, 1961 61 y.o. Admit date: 08/20/2022 D/C date:     08/26/2022   Primary Discharge Diagnoses:  1. A/C Biventricular Heart Failure 2. PAF 3. H/O LM Coronary Dissection  4. Pulmonary HTN  5. Severe TR 6. OSA 7. DMII 8. AKI on CKD Stage IIIa 9. PVCs/NSVT 10. Insomnia   Hospital Course:   Mr Bowlby is a 61 year old with a history of ICD, ICM, S/P CABG iatrogenic LM dissection during diagnostic cath in 2007, DMII, PAD, OSA, CKD Stage IIIa, and chronic biventricular heart failure. Followed by Dr Posey Pronto at [facility] for transplant work up.    Admitted twice in June with A/C Biventricular Heart Failure and required diuresis.  GDMT stopped. Placed on milrinone due to low output physiology on cath. Failed milrinone with persistently low mixed venous saturation. Discharged on milrinone 0.25 mcg. Also had high PVC burden and was placed on mexiletine. Later switched to [facility] for ongoing high PVC burden.   Followed closely in the HF clinic. He was seen 08/19/22 with progressive decline despite home milrinone. Admitted from the cath lab with elevated filling pressures, pulmonary venous hypertension and moderately reduced cardiac output. Milrinone increased to 0.375 mcg. CT surgery consulted for mechanical support. Impella 5.5 placed. Hemodynamics improved but cardiac output remained marginal. Milrinone increased to 0.5 mcg and maintained on Impella 5.5.   Dr Haroldine Laws discussed with Dr Posey Pronto. Deemed appropriate for transfer to [facility] for possible transplant. Transferred 08/23/22  Swan #s 08/23/22  On milrinone 0.375 mcg + Impella 5.5 @P8   CO-OX 64%.  CVP 6 PA 36/22 CO 4.7 CI 2. 3  RHC 08/20/22  On milrinone 0.25 mcg/kg/min  RA = 7 RV = 71/13 PA = 70/20 (37) PCW = 25 (v = 38) Fick cardiac output/index = 5.3/2.6 Thermo CO/CI = 4.3/2.1 PVR = 2.3 (Fick) 2.8 (Thermo) Ao sat = 90% PA sat =  54%, 55% PAPi = 7.1  Discharge Vitals: Blood pressure (!) 110/95, pulse (!) 59, temperature 98.2 F (36.8 C), resp. rate 11, height 6' (1.829 m), weight 80 kg, SpO2 97 %.  Labs: Lab Results  Component Value Date   WBC 12.2 (H) 08/23/2022   HGB 11.9 (L) 08/23/2022   HCT 35.0 (L) 08/23/2022   MCV 80.5 08/23/2022   PLT 180 08/23/2022    Recent Labs  Lab 08/19/22 1058 08/20/22 1001 08/23/22 0413 08/23/22 0427  NA 135   < > 132* 127*  K 3.9   < > 4.8 4.7  CL 97*   < > 92*  --   CO2 26   < > 26  --   BUN 24*   < > 26*  --   CREATININE 2.35*   < > 1.68*  --   CALCIUM 9.4   < > 9.7  --   PROT 7.6  --   --   --   BILITOT 1.9*  --   --   --   ALKPHOS 75  --   --   --   ALT 28  --   --   --   AST 25  --   --   --   GLUCOSE 142*   < > 142*  --    < > = values in this interval not displayed.   Lab Results  Component Value Date   CHOL 141 11/30/2021   HDL 48.90 11/30/2021   LDLCALC 76 11/30/2021  TRIG 82.0 11/30/2021   BNP (last 3 results) Recent Labs    04/15/22 0933 06/24/22 1010 08/19/22 1058  BNP 1,220.8* 573.1* 715.1*    ProBNP (last 3 results) Recent Labs    11/30/21 1301  PROBNP 943.0*     Diagnostic Studies/Procedures   No results found.  Discharge Medications   Allergies as of 08/23/2022   No Known Allergies      Medication List     STOP taking these medications    Eliquis 5 MG Tabs tablet Generic drug: apixaban   milrinone 20 MG/100 ML Soln infusion Commonly known as: PRIMACOR       TAKE these medications    Accu-Chek Guide test strip Generic drug: glucose blood Use as directed-patient needs an appt   Accu-Chek Softclix Lancets lancets Use as directed   AeroChamber Plus inhaler Use as instructed   albuterol 108 (90 Base) MCG/ACT inhaler Commonly known as: VENTOLIN HFA Inhale 2 puffs into the lungs every 6 (six) hours as needed for wheezing or shortness of breath.   amiodarone 200 MG tablet Commonly known as:  PACERONE Take 1 tablet (200 mg total) by mouth daily.   atorvastatin 40 MG tablet Commonly known as: LIPITOR Take 1 tablet (40 mg total) by mouth daily.   blood glucose meter kit and supplies Kit Dispense based on patient and insurance preference. Use up to four times daily as directed.   Breztri Aerosphere 160-9-4.8 MCG/ACT Aero Generic drug: Budeson-Glycopyrrol-Formoterol Inhale 2 puffs into the lungs 2 (two) times daily.   digoxin 0.125 MG tablet Commonly known as: LANOXIN Take 1 tablet (0.125 mg total) by mouth daily.   Jardiance 10 MG Tabs tablet Generic drug: empagliflozin Take 1 tablet (10 mg total) by mouth daily.   omeprazole 40 MG capsule Commonly known as: PRILOSEC Take 1 capsule (40 mg total) by mouth daily.   spironolactone 25 MG tablet Commonly known as: ALDACTONE Take 25 mg by mouth daily.   torsemide 20 MG tablet Commonly known as: DEMADEX Take 2 tablets (40 mg total) by mouth daily.        Disposition   The patient will be discharged in stable condition to [facility] via [facility]       Duration of Discharge Encounter: Greater than 35 minutes   Signed, Kaeleigh Westendorf NP-C  08/23/22   Agree with above. Stabilized with Impella 5.5. Transfer to [facility] for Transplant eval.   Glori Bickers, MD  6:04 PM

## 2022-08-23 NOTE — Progress Notes (Signed)
1 Day Post-Op Procedure(s) (LRB): PLACEMENT OF IMPELLA 5.5 LEFT VENTRICULAR ASSIST DEVICE (N/A) TRANSESOPHAGEAL ECHOCARDIOGRAM (TEE) (N/A) Subjective: Some R shoulder pain after Impella 5.5 implant yesterday States his energy and appetite improved Dressing dry w/o wound hematoma On heparin in purge, low dose peripheral heparin Creat improved on Impella support. Flow 4L/min  Objective: Vital signs in last 24 hours: Temp:  [97.3 F (36.3 C)-98.1 F (36.7 C)] 97.7 F (36.5 C) (10/20 1030) Pulse Rate:  [48-65] 58 (10/20 1030) Cardiac Rhythm: Sinus bradycardia (10/20 0400) Resp:  [10-17] 13 (10/20 1030) BP: (114-120)/(80-89) 117/87 (10/20 1000) SpO2:  [94 %-100 %] 96 % (10/20 1030) Arterial Line BP: (97-156)/(49-78) 103/63 (10/20 1030) Weight:  [80 kg] 80 kg (10/20 0400)  Hemodynamic parameters for last 24 hours: PAP: (26-64)/(9-41) 39/33 CVP:  [0 mmHg-10 mmHg] 5 mmHg PCWP:  [11 mmHg] 11 mmHg CO:  [3.1 L/min-5.7 L/min] 4.1 L/min CI:  [1.5 L/min/m2-2.8 L/min/m2] 2 L/min/m2  Intake/Output from previous day: 10/19 0701 - 10/20 0700 In: 1494.1 [P.O.:460; I.V.:837.4] Out: 1685 [Urine:1485; Blood:200] Intake/Output this shift: Total I/O In: 47.6 [I.V.:36.2; Other:11.4] Out: 265 [Urine:265]       Exam    General- alert and comfortable at bed rest    Neck- no JVD, no cervical adenopathy palpable, no carotid bruit   Lungs- clear without rales, wheezes   Cor- regular rate and rhythm, no murmur , gallop   Abdomen- soft, non-tender   Extremities - warm, non-tender, minimal edema   Neuro- oriented, appropriate, no focal weakness   Lab Results: Recent Labs    08/22/22 0427 08/22/22 0937 08/23/22 0413 08/23/22 0427  WBC 6.4  --  12.2*  --   HGB 10.8*   < > 10.2* 11.9*  HCT 33.6*   < > 31.7* 35.0*  PLT 171  --  180  --    < > = values in this interval not displayed.   BMET:  Recent Labs    08/22/22 0427 08/22/22 0937 08/23/22 0413 08/23/22 0427  NA 133*   < > 132*  127*  K 4.3   < > 4.8 4.7  CL 94*  --  92*  --   CO2 28  --  26  --   GLUCOSE 118*  --  142*  --   BUN 23*  --  26*  --   CREATININE 1.91*  --  1.68*  --   CALCIUM 9.7  --  9.7  --    < > = values in this interval not displayed.    PT/INR:  Recent Labs    08/21/22 1616  LABPROT 15.7*  INR 1.3*   ABG    Component Value Date/Time   PHART 7.462 (H) 08/23/2022 0427   HCO3 27.6 08/23/2022 0427   TCO2 29 08/23/2022 0427   O2SAT 96 08/23/2022 0427   CBG (last 3)  Recent Labs    08/22/22 0803  GLUCAP 116*    Assessment/Plan: S/P Procedure(s) (LRB): PLACEMENT OF IMPELLA 5.5 LEFT VENTRICULAR ASSIST DEVICE (N/A) TRANSESOPHAGEAL ECHOCARDIOGRAM (TEE) (N/A) Impella 5.5 support as bridge to transplant   LOS: 3 days    Bruce Robertson 08/23/2022

## 2022-08-23 NOTE — Progress Notes (Addendum)
ANTICOAGULATION CONSULT NOTE - Follow Up Consult  Pharmacy Consult for heparin Indication: atrial fibrillation  No Known Allergies  Patient Measurements: Height: 6' (182.9 cm) Weight: 80 kg (176 lb 5.9 oz) IBW/kg (Calculated) : 77.6 Heparin Dosing Weight: 80 kg  Vital Signs: Temp: 97.9 F (36.6 C) (10/20 0700) Temp Source: Core (10/20 0000) BP: 114/80 (10/20 0600) Pulse Rate: 58 (10/20 0700)  Labs: Recent Labs    08/20/22 2158 08/21/22 0514 08/21/22 1616 08/22/22 0427 08/22/22 0937 08/22/22 1356 08/23/22 0413 08/23/22 0427  HGB  --  9.9*  --  10.8*   < > 11.2* 10.2* 11.9*  HCT  --  32.0*  --  33.6*   < > 33.0* 31.7* 35.0*  PLT  --  170  --  171  --   --  180  --   APTT 47* 59*  --   --   --   --   --   --   LABPROT  --   --  15.7*  --   --   --   --   --   INR  --   --  1.3*  --   --   --   --   --   HEPARINUNFRC 0.51 0.54  --  0.41  --   --  <0.10*  --   CREATININE  --  2.00*  --  1.91*  --   --  1.68*  --    < > = values in this interval not displayed.     Estimated Creatinine Clearance: 52 mL/min (A) (by C-G formula based on SCr of 1.68 mg/dL (H)).  Assessment: 61 y/o male with CHF class IV and afib on Eliquis PTA - last dose 10/16- which is on hold while awaiting Impella placement.  Impella 5.5 placed 10/19. Heparin infusing via purge only (~575 units/h). CBC stable, heparin level <0.1. Ok to start systemic heparin per Dr Prescott Gum.   Goal of Therapy:  Heparin level 0.3-0.5 units/ml Monitor platelets by anticoagulation protocol: Yes   Plan:  -Continue heparinized purge -Add systemic heparin 500 units/h (total ~1050 units/h) - will titrate slowly -Check heparin level in 6h  Arrie Senate, PharmD, [place], [facility] Clinical Pharmacist [phone] Please check AMION for all [place] numbers 08/23/2022

## 2022-08-23 NOTE — Progress Notes (Signed)
ANTICOAGULATION CONSULT NOTE - Follow Up Consult  Pharmacy Consult for heparin Indication: atrial fibrillation  No Known Allergies  Patient Measurements: Height: 6' (182.9 cm) Weight: 80 kg (176 lb 5.9 oz) IBW/kg (Calculated) : 77.6 Heparin Dosing Weight: 80 kg  Vital Signs: Temp: 97.7 F (36.5 C) (10/20 1030) Temp Source: Core (10/20 1600) BP: 117/87 (10/20 1000) Pulse Rate: 58 (10/20 1030)  Labs: Recent Labs    08/20/22 2158 08/20/22 2158 08/21/22 0514 08/21/22 1616 08/22/22 0427 08/22/22 0937 08/22/22 1356 08/23/22 0413 08/23/22 0427 08/23/22 1630  HGB  --    < > 9.9*  --  10.8*   < > 11.2* 10.2* 11.9*  --   HCT  --    < > 32.0*  --  33.6*   < > 33.0* 31.7* 35.0*  --   PLT  --   --  170  --  171  --   --  180  --   --   APTT 47*  --  59*  --   --   --   --   --   --   --   LABPROT  --   --   --  15.7*  --   --   --   --   --   --   INR  --   --   --  1.3*  --   --   --   --   --   --   HEPARINUNFRC 0.51  --  0.54  --  0.41  --   --  <0.10*  --  <0.10*  CREATININE  --   --  2.00*  --  1.91*  --   --  1.68*  --   --    < > = values in this interval not displayed.    Estimated Creatinine Clearance: 52 mL/min (A) (by C-G formula based on SCr of 1.68 mg/dL (H)).  Assessment: 61 y/o male with CHF class IV and afib on Eliquis PTA - last dose 10/16- which is on hold. Impella 5.5 placed 10/19. Heparin infusing via purge only (~575 units/h). CBC stable, heparin level <0.1. Ok to start systemic heparin per Dr Prescott Gum.   Heparin level < 0.1 is therapeutic on 500 units/hr. Purge is running at 11.28m/hr (= 570 units/hr heparin). P8, CO 4.6 L/min. Patient has transfer orders to [facility]  No bleeding.   Goal of Therapy:  Heparin level 0.3-0.5 units/ml Monitor platelets by anticoagulation protocol: Yes   Plan:  Continue heparinized purge Increase systemic heparin 600 units/h (total ~1170 units/h) - will titrate slowly Monitor daily heparin level, CBC Monitor for  signs/symptoms of bleeding   LBenetta Spar PharmD, BCPS, BCCP Clinical Pharmacist  Please check AMION for all [place]phone numbers After 10:00 PM, call [place]

## 2022-08-23 NOTE — Progress Notes (Addendum)
Advanced Heart Failure Rounding Note  PCP-Cardiologist: Minus Breeding, MD  AHF: Dr. Haroldine Laws   Subjective:    10/17: RHC (on Milrinone 0.25) markedly elevated filling pressures and pulmonary venous HTN with moderately reduced CO, CI 2.1.  10/18: Milrinone increased 0.375 + NE 3 added (fixed dose) 10/19: Impella 5.5 placed.   Currently on Milrinone 0.375   CO-OX 64%.   Impella P8 --> Flow 4.4  LVEPD 39  Swan #s CVP 6  PA 59/32 PCW 21 (v - to 40) CO 4.1 CI 2  PVR 3   Feels better. Denies SOB.     Objective:   Weight Range: 80 kg Body mass index is 23.92 kg/m.   Vital Signs:   Temp:  [97.3 F (36.3 C)-98.1 F (36.7 C)] 97.9 F (36.6 C) (10/20 0700) Pulse Rate:  [48-65] 58 (10/20 0700) Resp:  [10-17] 14 (10/20 0700) BP: (114)/(80) 114/80 (10/20 0600) SpO2:  [95 %-100 %] 97 % (10/20 0700) Arterial Line BP: (97-156)/(49-78) 129/78 (10/20 0700) Weight:  [80 kg] 80 kg (10/20 0400) Last BM Date : 08/21/22  Weight change: Filed Weights   08/21/22 0500 08/22/22 0500 08/23/22 0400  Weight: 80.2 kg 77.3 kg 80 kg    Intake/Output:   Intake/Output Summary (Last 24 hours) at 08/23/2022 0749 Last data filed at 08/23/2022 0700 Gross per 24 hour  Intake 1446.23 ml  Output 1635 ml  Net -188.77 ml     CVP 6-7  Physical Exam   General:   No resp difficulty + Swan R subclavian HEENT: normal Neck: supple. JVP 6-7 no JVD. Carotids 2+ bilat; no bruits. No lymphadenopathy or thryomegaly appreciated. R Cor: PMI nondisplaced. Regular rate & rhythm. No rubs, gallops or murmurs. R axillary impella site ok.  Lungs: clear Abdomen: soft, nontender, nondistended. No hepatosplenomegaly. No bruits or masses. Good bowel sounds. Extremities: no cyanosis, clubbing, rash, edema Neuro: alert & orientedx3, cranial nerves grossly intact. moves all 4 extremities w/o difficulty. Affect pleasant GU: foley   Telemetry   NSR 50-60s  personally checked.   EKG    N/A   Labs     CBC Recent Labs    08/22/22 0427 08/22/22 0937 08/23/22 0413 08/23/22 0427  WBC 6.4  --  12.2*  --   HGB 10.8*   < > 10.2* 11.9*  HCT 33.6*   < > 31.7* 35.0*  MCV 82.0  --  80.5  --   PLT 171  --  180  --    < > = values in this interval not displayed.   Basic Metabolic Panel Recent Labs    08/22/22 0427 08/22/22 0937 08/23/22 0413 08/23/22 0427  NA 133*   < > 132* 127*  K 4.3   < > 4.8 4.7  CL 94*  --  92*  --   CO2 28  --  26  --   GLUCOSE 118*  --  142*  --   BUN 23*  --  26*  --   CREATININE 1.91*  --  1.68*  --   CALCIUM 9.7  --  9.7  --   MG 2.4  --  2.4  --    < > = values in this interval not displayed.   Liver Function Tests No results for input(s): "AST", "ALT", "ALKPHOS", "BILITOT", "PROT", "ALBUMIN" in the last 72 hours.  No results for input(s): "LIPASE", "AMYLASE" in the last 72 hours. Cardiac Enzymes No results for input(s): "CKTOTAL", "CKMB", "CKMBINDEX", "TROPONINI" in the  last 72 hours.  BNP: BNP (last 3 results) Recent Labs    04/15/22 0933 06/24/22 1010 08/19/22 1058  BNP 1,220.8* 573.1* 715.1*    ProBNP (last 3 results) Recent Labs    11/30/21 1301  PROBNP 943.0*     D-Dimer No results for input(s): "DDIMER" in the last 72 hours. Hemoglobin A1C No results for input(s): "HGBA1C" in the last 72 hours. Fasting Lipid Panel No results for input(s): "CHOL", "HDL", "LDLCALC", "TRIG", "CHOLHDL", "LDLDIRECT" in the last 72 hours. Thyroid Function Tests Recent Labs    08/21/22 1616  TSH 5.424*    Other results:   Imaging    ECHOCARDIOGRAM LIMITED  Result Date: 08/22/2022    ECHOCARDIOGRAM REPORT   Patient Name:   Bruce Robertson Date of Exam: 08/22/2022 Medical Rec #:  [id number]        Height:       72.0 in Accession #:    [id number]       Weight:       170.4 lb Date of Birth:  [birth year]         BSA:          1.990 m Patient Age:    61 years         BP:           108/71 mmHg Patient Gender: M                HR:           62  bpm. Exam Location:  Inpatient Procedure: Limited Echo Indications:     Cardiomyopathy-Ischemic I25.5  History:         Patient has prior history of Echocardiogram examinations, most                  recent 04/19/2022. CAD, Prior CABG; Risk Factors:Sleep Apnea,                  Diabetes and Dyslipidemia. Chronic kidney disease.  Sonographer:     Darlina Sicilian RDCS Referring Phys:  Haines VANTRIGT Diagnosing Phys: Kirk Ruths MD IMPRESSIONS  1. Impella noted crossing aortic valve; appears to be 5-6 cm from aortic valve; notified Dr Haroldine Laws who has repositioned. Limited study; doppler not performed.  2. Left ventricular ejection fraction, by estimation, is <20%. The left ventricle has severely decreased function. The left ventricle demonstrates global hypokinesis. The left ventricular internal cavity size was severely dilated. Left ventricular diastolic function could not be evaluated.  3. Right ventricular systolic function is moderately reduced. The right ventricular size is mildly enlarged.  4. Left atrial size was moderately dilated.  5. Right atrial size was moderately dilated.  6. The mitral valve is normal in structure. Not assessed mitral valve regurgitation.  7. Tricuspid valve regurgitation Not assessed.  8. The aortic valve was not assessed. Aortic valve regurgitation Not assessed.  9. Pulmonic valve regurgitation Not assessed. 10. Aortic dilatation noted. There is mild dilatation of the aortic root, measuring 40 mm. FINDINGS  Left Ventricle: Left ventricular ejection fraction, by estimation, is <20%. The left ventricle has severely decreased function. The left ventricle demonstrates global hypokinesis. The left ventricular internal cavity size was severely dilated. There is no left ventricular hypertrophy. Left ventricular diastolic function could not be evaluated. Right Ventricle: The right ventricular size is mildly enlarged. Right ventricular systolic function is moderately reduced. Left  Atrium: Left atrial size was moderately dilated. Right Atrium: Right atrial size was moderately dilated. Pericardium: There is  no evidence of pericardial effusion. Mitral Valve: The mitral valve is normal in structure. There is mild thickening of the mitral valve leaflet(s). Not assessed mitral valve regurgitation. Tricuspid Valve: The tricuspid valve is normal in structure. Tricuspid valve regurgitation Not assessed. Aortic Valve: The aortic valve was not assessed. Aortic valve regurgitation Not assessed. Pulmonic Valve: The pulmonic valve was not assessed. Pulmonic valve regurgitation Not assessed. Aorta: Aortic dilatation noted. There is mild dilatation of the aortic root, measuring 40 mm. IAS/Shunts: The interatrial septum was not assessed. Additional Comments: Impella noted crossing aortic valve; appears to be 5-6 cm from aortic valve; notified Dr Haroldine Laws who has repositioned. Limited study; doppler not performed. A venous catheter is visualized. Kirk Ruths MD Electronically signed by Kirk Ruths MD Signature Date/Time: 08/22/2022/2:42:54 PM    Final (Updated)    DG Chest Port 1 View  Result Date: 08/22/2022 CLINICAL DATA:  Left ventricular assist device. EXAM: PORTABLE CHEST 1 VIEW COMPARISON:  Chest 08/21/2022 FINDINGS: Interval placement of Impella device from a right subclavian approach. Impella device overlying the mid heart. No pneumothorax. Mild cardiac enlargement. Mild vascular congestion and mild interstitial edema. No effusion AICD remains in good position and unchanged. Swan-Ganz catheter tip in the right pulmonary artery unchanged. IMPRESSION: Interval placement of Impella device overlying the heart. No pneumothorax Mild pulmonary edema. Electronically Signed   By: Franchot Gallo M.D.   On: 08/22/2022 13:57   HYBRID OR IMAGING ([facility] ONLY)  Result Date: 08/22/2022 There is no interpretation for this exam.  This order is for images obtained during a surgical procedure.  Please See  "Surgeries" Tab for more information regarding the procedure.     Medications:     Scheduled Medications:  amiodarone  200 mg Oral Daily   atorvastatin  40 mg Oral Daily   Chlorhexidine Gluconate Cloth  6 each Topical Daily   sodium chloride flush  10-40 mL Intracatheter Q12H   sodium chloride flush  3 mL Intravenous Q12H   sodium chloride flush  3 mL Intravenous Q12H   sodium chloride flush  3 mL Intravenous Q12H   traZODone  50 mg Oral QHS    Infusions:  sodium chloride     sodium chloride     sodium chloride     sodium chloride 10 mL/hr at 08/22/22 0705   sodium chloride     epinephrine Stopped (08/22/22 1400)   heparin 50 units/mL (Impella PURGE) in dextrose 5% 1000 mL     milrinone 0.375 mcg/kg/min (08/23/22 9767)   norepinephrine (LEVOPHED) Adult infusion Stopped (08/22/22 1415)    PRN Medications: sodium chloride, sodium chloride, sodium chloride, sodium chloride, acetaminophen, albuterol, fentaNYL (SUBLIMAZE) injection, melatonin, menthol-cetylpyridinium, ondansetron (ZOFRAN) IV, oxyCODONE, sodium chloride flush, sodium chloride flush, sodium chloride flush    Patient Profile   61 y/o male w/ 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 Once stable will transfer to [facility] for transplant.   Assessment/Plan   1. Acute on Chronic Biventricular Heart Failure: - NICM, ? Noncompaction. Has ICD  - followed closely by the [facility] (Dr. Posey Pronto) - Echo 3/23 ([facility]) EF < 15%, RV moderately reduced  - CXP 3/23 ([facility]) pVO2 15.9 (55%0 slope 34 RER 1.37 - RHC (6/23): with elevated left sided filling pressure PCWP 29 and low CO.  - Echo  06/24/22 EF <  20% G3DD RV severely decreased - NYHA IV on home milrinone (0.25 mg/kg/min) - RHC 10/17 (on milrinone) w/ elevated left sided  filling pressures PCWP 25 and marginal CI 2.1. Diuresed w/ IV Lasix  - S/P Impella 5.5--> On P8 flow 4.4 LVEDP 39  --On milrinone 0.375 . Co-ox 63%, CI 2, CVP 6-7. - Give 40 mg IV lasix now.    - Off GMT with shock  - ? 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 Mexiletine switched to amiodarone for better PVC suppression. PVCs now suppressed > 4 weeks with no improvement in EF - Blood type O pos. - Case d/w Dr. Posey Pronto at [facility]. We agree that Bruce Robertson is end-stage. Once stablized w/ Impella support, will transfer to [facility] for transplant.     2. PAF - Maintaining SR.  - Continue heparin gtt    3. H/o LM Coronary Dissection  - Iatrogenic during diagnostic LHC in 2007.  - s/p CABG.  - No chest pain    4. Pulmonary HTN - Echo RVSP 53 mmHg  - suspect combination WHO Groups 2&3 - RHC (6/23): RA 7, PA 54/28 (39), PCWP 29 (v 35), Fick CO/CI 3.4/1.6, PVR 2.9 WU, PAPi 3.7, PA sat 53% - + RV Failure - Sildenafil recently discontinued due to hypotension. Now on milrinone. - RHC 10/17 showed pulmonary venous HTN, PVR 2.8, PAPi 7.1  - Continue milrinone 0.375 and diuresis    5. Severe TR - Likely functional in setting of dilated RA/RV. - Continue diuretics.   6. OSA - Compliant w/ CPAP     7. Type 2DM - last Hgb A1c 7.0 - On SSI   8. AKI on CKD, Stage IIIa - Baseline SCr ~1.6  - Creatinine trending down with milrinone + Impella    9. PVCs/NSVT - mexiletine switched to amiodarone. No improvement in EF with PVC suppression  - Continue amio for now  7. Insomnia - improved w/ sleep aid  8. Nausea - PRN Zofran   Will update Dr Posey Pronto at [facility] today. Possible transfer.    Length of Stay: 3  Amy Clegg, NP  08/23/2022, 7:49 AM  Advanced Heart Failure Team Pager [phone] (M-F; 7a - 5p)  Please contact [facility] for night-coverage after hours (5p -7a ) and weekends on [email or web address]  Agree with above.   Feeling better, Dyspnea improved. No orthopnea or PND. R shoulder a bit sore  Swan  numbers shot personally. CVP 6  PA 59/30 PAP 21 (v = 40) CI 2.4 SVR 1440 PVR 3.0 on Impella p-8 and milrinone 0.375. Scr improved  General:  Lying in bed  No resp difficulty HEENT: normal Neck: supple. RIJ swan Carotids 2+ bilat; no bruits. No lymphadenopathy or thryomegaly appreciated. Cor: PMI nondisplaced. Regular rate & rhythm. No rubs, gallops or murmurs. R axillary Impella site ok  Lungs: clear Abdomen: soft, nontender, nondistended. No hepatosplenomegaly. No bruits or masses. Good bowel sounds. Extremities: no cyanosis, clubbing, rash, edema Neuro: alert & orientedx3, cranial nerves grossly intact. moves all 4 extremities w/o difficulty. Affect pleasant  Improving but PA pressures still up and CI marginal. Increase milrinone to 0.5. Continue Impella 5.5 support. Waveforms look good.   I did bedside echo and Impella up against lateral wall -> I repositioned device. Continue diuresis.  Discussed with [facility]. Plan transfer for transplant eval.   CRITICAL CARE Performed by: Glori Bickers  Total critical care time: 55 minutes  Critical care time was exclusive of separately billable procedures and treating other patients.  Critical care  was necessary to treat or prevent imminent or life-threatening deterioration.  Critical care was time spent personally by me (independent of midlevel providers or residents) on the following activities: development of treatment plan with patient and/or surrogate as well as nursing, discussions with consultants, evaluation of patient's response to treatment, examination of patient, obtaining history from patient or surrogate, ordering and performing treatments and interventions, ordering and review of laboratory studies, ordering and review of radiographic studies, pulse oximetry and re-evaluation of patient's condition.  Glori Bickers, MD  2:46 PM

## 2022-08-24 DIAGNOSIS — T8621 Heart transplant rejection: Secondary | ICD-10-CM | POA: Diagnosis not present

## 2022-08-24 DIAGNOSIS — R110 Nausea: Secondary | ICD-10-CM | POA: Diagnosis not present

## 2022-08-24 DIAGNOSIS — Z951 Presence of aortocoronary bypass graft: Secondary | ICD-10-CM | POA: Diagnosis not present

## 2022-08-24 DIAGNOSIS — Z95811 Presence of heart assist device: Secondary | ICD-10-CM | POA: Diagnosis not present

## 2022-08-24 DIAGNOSIS — I501 Left ventricular failure: Secondary | ICD-10-CM | POA: Diagnosis not present

## 2022-08-24 DIAGNOSIS — T86298 Other complications of heart transplant: Secondary | ICD-10-CM | POA: Diagnosis not present

## 2022-08-24 DIAGNOSIS — R739 Hyperglycemia, unspecified: Secondary | ICD-10-CM | POA: Diagnosis not present

## 2022-08-24 DIAGNOSIS — N189 Chronic kidney disease, unspecified: Secondary | ICD-10-CM | POA: Diagnosis not present

## 2022-08-24 DIAGNOSIS — Z4682 Encounter for fitting and adjustment of non-vascular catheter: Secondary | ICD-10-CM | POA: Diagnosis not present

## 2022-08-24 DIAGNOSIS — D62 Acute posthemorrhagic anemia: Secondary | ICD-10-CM | POA: Diagnosis not present

## 2022-08-24 DIAGNOSIS — K219 Gastro-esophageal reflux disease without esophagitis: Secondary | ICD-10-CM | POA: Diagnosis not present

## 2022-08-24 DIAGNOSIS — J90 Pleural effusion, not elsewhere classified: Secondary | ICD-10-CM | POA: Diagnosis not present

## 2022-08-24 DIAGNOSIS — I5023 Acute on chronic systolic (congestive) heart failure: Secondary | ICD-10-CM | POA: Diagnosis not present

## 2022-08-24 DIAGNOSIS — Z9989 Dependence on other enabling machines and devices: Secondary | ICD-10-CM | POA: Diagnosis not present

## 2022-08-24 DIAGNOSIS — G4733 Obstructive sleep apnea (adult) (pediatric): Secondary | ICD-10-CM | POA: Diagnosis not present

## 2022-08-24 DIAGNOSIS — E1122 Type 2 diabetes mellitus with diabetic chronic kidney disease: Secondary | ICD-10-CM | POA: Diagnosis not present

## 2022-08-24 DIAGNOSIS — Z941 Heart transplant status: Secondary | ICD-10-CM | POA: Diagnosis not present

## 2022-08-24 DIAGNOSIS — G4700 Insomnia, unspecified: Secondary | ICD-10-CM | POA: Diagnosis not present

## 2022-08-24 DIAGNOSIS — I428 Other cardiomyopathies: Secondary | ICD-10-CM | POA: Diagnosis not present

## 2022-08-24 DIAGNOSIS — T380X5A Adverse effect of glucocorticoids and synthetic analogues, initial encounter: Secondary | ICD-10-CM | POA: Diagnosis not present

## 2022-08-24 DIAGNOSIS — E871 Hypo-osmolality and hyponatremia: Secondary | ICD-10-CM | POA: Diagnosis not present

## 2022-08-24 DIAGNOSIS — D84821 Immunodeficiency due to drugs: Secondary | ICD-10-CM | POA: Diagnosis not present

## 2022-08-24 DIAGNOSIS — E119 Type 2 diabetes mellitus without complications: Secondary | ICD-10-CM | POA: Diagnosis not present

## 2022-08-24 DIAGNOSIS — J811 Chronic pulmonary edema: Secondary | ICD-10-CM | POA: Diagnosis not present

## 2022-08-24 DIAGNOSIS — D631 Anemia in chronic kidney disease: Secondary | ICD-10-CM | POA: Diagnosis not present

## 2022-08-24 DIAGNOSIS — I4891 Unspecified atrial fibrillation: Secondary | ICD-10-CM | POA: Diagnosis not present

## 2022-08-24 DIAGNOSIS — Z452 Encounter for adjustment and management of vascular access device: Secondary | ICD-10-CM | POA: Diagnosis not present

## 2022-08-24 DIAGNOSIS — I517 Cardiomegaly: Secondary | ICD-10-CM | POA: Diagnosis not present

## 2022-08-24 DIAGNOSIS — I310 Chronic adhesive pericarditis: Secondary | ICD-10-CM | POA: Diagnosis not present

## 2022-08-24 DIAGNOSIS — N179 Acute kidney failure, unspecified: Secondary | ICD-10-CM | POA: Diagnosis not present

## 2022-08-24 DIAGNOSIS — R918 Other nonspecific abnormal finding of lung field: Secondary | ICD-10-CM | POA: Diagnosis not present

## 2022-08-24 DIAGNOSIS — G4731 Primary central sleep apnea: Secondary | ICD-10-CM | POA: Diagnosis not present

## 2022-08-24 DIAGNOSIS — J986 Disorders of diaphragm: Secondary | ICD-10-CM | POA: Diagnosis not present

## 2022-08-24 DIAGNOSIS — K5900 Constipation, unspecified: Secondary | ICD-10-CM | POA: Diagnosis not present

## 2022-08-24 DIAGNOSIS — I2510 Atherosclerotic heart disease of native coronary artery without angina pectoris: Secondary | ICD-10-CM | POA: Diagnosis not present

## 2022-08-24 DIAGNOSIS — D509 Iron deficiency anemia, unspecified: Secondary | ICD-10-CM | POA: Diagnosis not present

## 2022-08-24 DIAGNOSIS — I130 Hypertensive heart and chronic kidney disease with heart failure and stage 1 through stage 4 chronic kidney disease, or unspecified chronic kidney disease: Secondary | ICD-10-CM | POA: Diagnosis not present

## 2022-08-24 DIAGNOSIS — Z4821 Encounter for aftercare following heart transplant: Secondary | ICD-10-CM | POA: Diagnosis not present

## 2022-08-24 DIAGNOSIS — R570 Cardiogenic shock: Secondary | ICD-10-CM | POA: Diagnosis not present

## 2022-08-24 DIAGNOSIS — Z4502 Encounter for adjustment and management of automatic implantable cardiac defibrillator: Secondary | ICD-10-CM | POA: Diagnosis not present

## 2022-08-24 DIAGNOSIS — I272 Pulmonary hypertension, unspecified: Secondary | ICD-10-CM | POA: Diagnosis not present

## 2022-08-24 DIAGNOSIS — I5084 End stage heart failure: Secondary | ICD-10-CM | POA: Diagnosis not present

## 2022-08-24 DIAGNOSIS — J9811 Atelectasis: Secondary | ICD-10-CM | POA: Diagnosis not present

## 2022-08-24 DIAGNOSIS — I519 Heart disease, unspecified: Secondary | ICD-10-CM | POA: Diagnosis not present

## 2022-08-24 DIAGNOSIS — Z4509 Encounter for adjustment and management of other cardiac device: Secondary | ICD-10-CM | POA: Diagnosis not present

## 2022-08-24 DIAGNOSIS — Z794 Long term (current) use of insulin: Secondary | ICD-10-CM | POA: Diagnosis not present

## 2022-08-24 DIAGNOSIS — G8918 Other acute postprocedural pain: Secondary | ICD-10-CM | POA: Diagnosis not present

## 2022-08-24 DIAGNOSIS — I5020 Unspecified systolic (congestive) heart failure: Secondary | ICD-10-CM | POA: Diagnosis not present

## 2022-08-24 DIAGNOSIS — T82847A Pain from cardiac prosthetic devices, implants and grafts, initial encounter: Secondary | ICD-10-CM | POA: Diagnosis not present

## 2022-08-24 DIAGNOSIS — N1831 Chronic kidney disease, stage 3a: Secondary | ICD-10-CM | POA: Diagnosis not present

## 2022-08-24 DIAGNOSIS — E785 Hyperlipidemia, unspecified: Secondary | ICD-10-CM | POA: Diagnosis not present

## 2022-08-24 DIAGNOSIS — J951 Acute pulmonary insufficiency following thoracic surgery: Secondary | ICD-10-CM | POA: Diagnosis not present

## 2022-08-24 DIAGNOSIS — E8770 Fluid overload, unspecified: Secondary | ICD-10-CM | POA: Diagnosis not present

## 2022-08-24 DIAGNOSIS — E875 Hyperkalemia: Secondary | ICD-10-CM | POA: Diagnosis not present

## 2022-08-24 DIAGNOSIS — J449 Chronic obstructive pulmonary disease, unspecified: Secondary | ICD-10-CM | POA: Diagnosis not present

## 2022-08-24 DIAGNOSIS — R1310 Dysphagia, unspecified: Secondary | ICD-10-CM | POA: Diagnosis not present

## 2022-08-24 DIAGNOSIS — D849 Immunodeficiency, unspecified: Secondary | ICD-10-CM | POA: Diagnosis not present

## 2022-08-24 DIAGNOSIS — E1165 Type 2 diabetes mellitus with hyperglycemia: Secondary | ICD-10-CM | POA: Diagnosis not present

## 2022-08-24 DIAGNOSIS — Z9889 Other specified postprocedural states: Secondary | ICD-10-CM | POA: Diagnosis not present

## 2022-08-24 DIAGNOSIS — D696 Thrombocytopenia, unspecified: Secondary | ICD-10-CM | POA: Diagnosis not present

## 2022-08-24 DIAGNOSIS — I420 Dilated cardiomyopathy: Secondary | ICD-10-CM | POA: Diagnosis not present

## 2022-08-24 LAB — TYPE AND SCREEN
ABO/RH(D): O POS
Antibody Screen: NEGATIVE
Unit division: 0
Unit division: 0

## 2022-08-24 LAB — BPAM RBC
Blood Product Expiration Date: 202311172359
Blood Product Expiration Date: 202311172359
Unit Type and Rh: 5100
Unit Type and Rh: 5100

## 2022-08-25 DIAGNOSIS — I428 Other cardiomyopathies: Secondary | ICD-10-CM | POA: Diagnosis not present

## 2022-08-25 DIAGNOSIS — Z794 Long term (current) use of insulin: Secondary | ICD-10-CM | POA: Diagnosis not present

## 2022-08-25 DIAGNOSIS — G8918 Other acute postprocedural pain: Secondary | ICD-10-CM | POA: Diagnosis not present

## 2022-08-25 DIAGNOSIS — J811 Chronic pulmonary edema: Secondary | ICD-10-CM | POA: Diagnosis not present

## 2022-08-25 DIAGNOSIS — E119 Type 2 diabetes mellitus without complications: Secondary | ICD-10-CM | POA: Diagnosis not present

## 2022-08-25 DIAGNOSIS — R570 Cardiogenic shock: Secondary | ICD-10-CM | POA: Diagnosis not present

## 2022-08-26 DIAGNOSIS — I5023 Acute on chronic systolic (congestive) heart failure: Secondary | ICD-10-CM | POA: Diagnosis not present

## 2022-08-26 DIAGNOSIS — I428 Other cardiomyopathies: Secondary | ICD-10-CM | POA: Diagnosis not present

## 2022-08-26 DIAGNOSIS — G8918 Other acute postprocedural pain: Secondary | ICD-10-CM | POA: Diagnosis not present

## 2022-08-26 DIAGNOSIS — J9811 Atelectasis: Secondary | ICD-10-CM | POA: Diagnosis not present

## 2022-08-26 DIAGNOSIS — R570 Cardiogenic shock: Secondary | ICD-10-CM | POA: Diagnosis not present

## 2022-08-26 DIAGNOSIS — E119 Type 2 diabetes mellitus without complications: Secondary | ICD-10-CM | POA: Diagnosis not present

## 2022-08-26 DIAGNOSIS — Z794 Long term (current) use of insulin: Secondary | ICD-10-CM | POA: Diagnosis not present

## 2022-08-26 NOTE — Progress Notes (Signed)
Remote ICD transmission.   

## 2022-08-27 ENCOUNTER — Encounter: Payer: Medicare Other | Admitting: Family Medicine

## 2022-08-27 DIAGNOSIS — Z794 Long term (current) use of insulin: Secondary | ICD-10-CM | POA: Diagnosis not present

## 2022-08-27 DIAGNOSIS — I428 Other cardiomyopathies: Secondary | ICD-10-CM | POA: Diagnosis not present

## 2022-08-27 DIAGNOSIS — Z95811 Presence of heart assist device: Secondary | ICD-10-CM | POA: Diagnosis not present

## 2022-08-27 DIAGNOSIS — E119 Type 2 diabetes mellitus without complications: Secondary | ICD-10-CM | POA: Diagnosis not present

## 2022-08-27 DIAGNOSIS — G8918 Other acute postprocedural pain: Secondary | ICD-10-CM | POA: Diagnosis not present

## 2022-08-27 DIAGNOSIS — I5023 Acute on chronic systolic (congestive) heart failure: Secondary | ICD-10-CM | POA: Diagnosis not present

## 2022-08-27 DIAGNOSIS — Z452 Encounter for adjustment and management of vascular access device: Secondary | ICD-10-CM | POA: Diagnosis not present

## 2022-08-27 DIAGNOSIS — R918 Other nonspecific abnormal finding of lung field: Secondary | ICD-10-CM | POA: Diagnosis not present

## 2022-08-27 DIAGNOSIS — R570 Cardiogenic shock: Secondary | ICD-10-CM | POA: Diagnosis not present

## 2022-08-27 DIAGNOSIS — J811 Chronic pulmonary edema: Secondary | ICD-10-CM | POA: Diagnosis not present

## 2022-08-27 DIAGNOSIS — I517 Cardiomegaly: Secondary | ICD-10-CM | POA: Diagnosis not present

## 2022-08-28 DIAGNOSIS — Z794 Long term (current) use of insulin: Secondary | ICD-10-CM | POA: Diagnosis not present

## 2022-08-28 DIAGNOSIS — I428 Other cardiomyopathies: Secondary | ICD-10-CM | POA: Diagnosis not present

## 2022-08-28 DIAGNOSIS — E119 Type 2 diabetes mellitus without complications: Secondary | ICD-10-CM | POA: Diagnosis not present

## 2022-08-28 DIAGNOSIS — I5020 Unspecified systolic (congestive) heart failure: Secondary | ICD-10-CM | POA: Diagnosis not present

## 2022-08-28 DIAGNOSIS — G8918 Other acute postprocedural pain: Secondary | ICD-10-CM | POA: Diagnosis not present

## 2022-08-28 DIAGNOSIS — R570 Cardiogenic shock: Secondary | ICD-10-CM | POA: Diagnosis not present

## 2022-08-29 DIAGNOSIS — E119 Type 2 diabetes mellitus without complications: Secondary | ICD-10-CM | POA: Diagnosis not present

## 2022-08-29 DIAGNOSIS — I428 Other cardiomyopathies: Secondary | ICD-10-CM | POA: Diagnosis not present

## 2022-08-29 DIAGNOSIS — Z794 Long term (current) use of insulin: Secondary | ICD-10-CM | POA: Diagnosis not present

## 2022-08-29 DIAGNOSIS — G8918 Other acute postprocedural pain: Secondary | ICD-10-CM | POA: Diagnosis not present

## 2022-08-29 DIAGNOSIS — R570 Cardiogenic shock: Secondary | ICD-10-CM | POA: Diagnosis not present

## 2022-08-30 DIAGNOSIS — I5020 Unspecified systolic (congestive) heart failure: Secondary | ICD-10-CM | POA: Diagnosis not present

## 2022-08-30 DIAGNOSIS — I428 Other cardiomyopathies: Secondary | ICD-10-CM | POA: Diagnosis not present

## 2022-08-30 DIAGNOSIS — R570 Cardiogenic shock: Secondary | ICD-10-CM | POA: Diagnosis not present

## 2022-08-30 DIAGNOSIS — G8918 Other acute postprocedural pain: Secondary | ICD-10-CM | POA: Diagnosis not present

## 2022-08-30 DIAGNOSIS — E119 Type 2 diabetes mellitus without complications: Secondary | ICD-10-CM | POA: Diagnosis not present

## 2022-08-30 DIAGNOSIS — Z794 Long term (current) use of insulin: Secondary | ICD-10-CM | POA: Diagnosis not present

## 2022-08-31 DIAGNOSIS — I4891 Unspecified atrial fibrillation: Secondary | ICD-10-CM | POA: Diagnosis not present

## 2022-08-31 DIAGNOSIS — K219 Gastro-esophageal reflux disease without esophagitis: Secondary | ICD-10-CM | POA: Diagnosis not present

## 2022-08-31 DIAGNOSIS — I5023 Acute on chronic systolic (congestive) heart failure: Secondary | ICD-10-CM | POA: Diagnosis not present

## 2022-08-31 DIAGNOSIS — N1831 Chronic kidney disease, stage 3a: Secondary | ICD-10-CM | POA: Diagnosis not present

## 2022-08-31 DIAGNOSIS — G4733 Obstructive sleep apnea (adult) (pediatric): Secondary | ICD-10-CM | POA: Diagnosis not present

## 2022-08-31 DIAGNOSIS — Z941 Heart transplant status: Secondary | ICD-10-CM | POA: Diagnosis not present

## 2022-08-31 DIAGNOSIS — G8918 Other acute postprocedural pain: Secondary | ICD-10-CM | POA: Diagnosis not present

## 2022-08-31 DIAGNOSIS — I130 Hypertensive heart and chronic kidney disease with heart failure and stage 1 through stage 4 chronic kidney disease, or unspecified chronic kidney disease: Secondary | ICD-10-CM | POA: Diagnosis not present

## 2022-08-31 DIAGNOSIS — R570 Cardiogenic shock: Secondary | ICD-10-CM | POA: Diagnosis not present

## 2022-08-31 DIAGNOSIS — J9811 Atelectasis: Secondary | ICD-10-CM | POA: Diagnosis not present

## 2022-08-31 DIAGNOSIS — J449 Chronic obstructive pulmonary disease, unspecified: Secondary | ICD-10-CM | POA: Diagnosis not present

## 2022-08-31 DIAGNOSIS — I517 Cardiomegaly: Secondary | ICD-10-CM | POA: Diagnosis not present

## 2022-08-31 DIAGNOSIS — I5084 End stage heart failure: Secondary | ICD-10-CM | POA: Diagnosis not present

## 2022-08-31 DIAGNOSIS — I310 Chronic adhesive pericarditis: Secondary | ICD-10-CM | POA: Diagnosis not present

## 2022-08-31 DIAGNOSIS — D509 Iron deficiency anemia, unspecified: Secondary | ICD-10-CM | POA: Diagnosis not present

## 2022-08-31 DIAGNOSIS — I428 Other cardiomyopathies: Secondary | ICD-10-CM | POA: Diagnosis not present

## 2022-08-31 DIAGNOSIS — I2510 Atherosclerotic heart disease of native coronary artery without angina pectoris: Secondary | ICD-10-CM | POA: Diagnosis not present

## 2022-08-31 DIAGNOSIS — J811 Chronic pulmonary edema: Secondary | ICD-10-CM | POA: Diagnosis not present

## 2022-09-01 DIAGNOSIS — Z951 Presence of aortocoronary bypass graft: Secondary | ICD-10-CM | POA: Diagnosis not present

## 2022-09-01 DIAGNOSIS — J9811 Atelectasis: Secondary | ICD-10-CM | POA: Diagnosis not present

## 2022-09-01 DIAGNOSIS — D696 Thrombocytopenia, unspecified: Secondary | ICD-10-CM | POA: Diagnosis not present

## 2022-09-01 DIAGNOSIS — I519 Heart disease, unspecified: Secondary | ICD-10-CM | POA: Diagnosis not present

## 2022-09-01 DIAGNOSIS — I420 Dilated cardiomyopathy: Secondary | ICD-10-CM | POA: Diagnosis not present

## 2022-09-01 DIAGNOSIS — N179 Acute kidney failure, unspecified: Secondary | ICD-10-CM | POA: Diagnosis not present

## 2022-09-01 DIAGNOSIS — Z452 Encounter for adjustment and management of vascular access device: Secondary | ICD-10-CM | POA: Diagnosis not present

## 2022-09-01 DIAGNOSIS — R570 Cardiogenic shock: Secondary | ICD-10-CM | POA: Diagnosis not present

## 2022-09-01 DIAGNOSIS — Z4502 Encounter for adjustment and management of automatic implantable cardiac defibrillator: Secondary | ICD-10-CM | POA: Diagnosis not present

## 2022-09-01 DIAGNOSIS — Z941 Heart transplant status: Secondary | ICD-10-CM | POA: Diagnosis not present

## 2022-09-01 DIAGNOSIS — Z4682 Encounter for fitting and adjustment of non-vascular catheter: Secondary | ICD-10-CM | POA: Diagnosis not present

## 2022-09-01 DIAGNOSIS — R1310 Dysphagia, unspecified: Secondary | ICD-10-CM | POA: Diagnosis not present

## 2022-09-01 DIAGNOSIS — D849 Immunodeficiency, unspecified: Secondary | ICD-10-CM | POA: Diagnosis not present

## 2022-09-01 DIAGNOSIS — J811 Chronic pulmonary edema: Secondary | ICD-10-CM | POA: Diagnosis not present

## 2022-09-01 DIAGNOSIS — I501 Left ventricular failure: Secondary | ICD-10-CM | POA: Diagnosis not present

## 2022-09-01 DIAGNOSIS — R918 Other nonspecific abnormal finding of lung field: Secondary | ICD-10-CM | POA: Diagnosis not present

## 2022-09-02 DIAGNOSIS — T380X5A Adverse effect of glucocorticoids and synthetic analogues, initial encounter: Secondary | ICD-10-CM | POA: Diagnosis not present

## 2022-09-02 DIAGNOSIS — E8770 Fluid overload, unspecified: Secondary | ICD-10-CM | POA: Diagnosis not present

## 2022-09-02 DIAGNOSIS — I5020 Unspecified systolic (congestive) heart failure: Secondary | ICD-10-CM | POA: Diagnosis not present

## 2022-09-02 DIAGNOSIS — I420 Dilated cardiomyopathy: Secondary | ICD-10-CM | POA: Diagnosis not present

## 2022-09-02 DIAGNOSIS — D62 Acute posthemorrhagic anemia: Secondary | ICD-10-CM | POA: Diagnosis not present

## 2022-09-02 DIAGNOSIS — J951 Acute pulmonary insufficiency following thoracic surgery: Secondary | ICD-10-CM | POA: Diagnosis not present

## 2022-09-02 DIAGNOSIS — G8918 Other acute postprocedural pain: Secondary | ICD-10-CM | POA: Diagnosis not present

## 2022-09-02 DIAGNOSIS — K5900 Constipation, unspecified: Secondary | ICD-10-CM | POA: Diagnosis not present

## 2022-09-02 DIAGNOSIS — N179 Acute kidney failure, unspecified: Secondary | ICD-10-CM | POA: Diagnosis not present

## 2022-09-02 DIAGNOSIS — R570 Cardiogenic shock: Secondary | ICD-10-CM | POA: Diagnosis not present

## 2022-09-02 DIAGNOSIS — Z9889 Other specified postprocedural states: Secondary | ICD-10-CM | POA: Diagnosis not present

## 2022-09-02 DIAGNOSIS — R739 Hyperglycemia, unspecified: Secondary | ICD-10-CM | POA: Diagnosis not present

## 2022-09-02 DIAGNOSIS — I130 Hypertensive heart and chronic kidney disease with heart failure and stage 1 through stage 4 chronic kidney disease, or unspecified chronic kidney disease: Secondary | ICD-10-CM | POA: Diagnosis not present

## 2022-09-02 DIAGNOSIS — Z941 Heart transplant status: Secondary | ICD-10-CM | POA: Diagnosis not present

## 2022-09-02 DIAGNOSIS — D849 Immunodeficiency, unspecified: Secondary | ICD-10-CM | POA: Diagnosis not present

## 2022-09-02 DIAGNOSIS — N1831 Chronic kidney disease, stage 3a: Secondary | ICD-10-CM | POA: Diagnosis not present

## 2022-09-03 DIAGNOSIS — E8770 Fluid overload, unspecified: Secondary | ICD-10-CM | POA: Diagnosis not present

## 2022-09-03 DIAGNOSIS — G4700 Insomnia, unspecified: Secondary | ICD-10-CM | POA: Diagnosis not present

## 2022-09-03 DIAGNOSIS — N1831 Chronic kidney disease, stage 3a: Secondary | ICD-10-CM | POA: Diagnosis not present

## 2022-09-03 DIAGNOSIS — Z452 Encounter for adjustment and management of vascular access device: Secondary | ICD-10-CM | POA: Diagnosis not present

## 2022-09-03 DIAGNOSIS — G4733 Obstructive sleep apnea (adult) (pediatric): Secondary | ICD-10-CM | POA: Diagnosis not present

## 2022-09-03 DIAGNOSIS — Z951 Presence of aortocoronary bypass graft: Secondary | ICD-10-CM | POA: Diagnosis not present

## 2022-09-03 DIAGNOSIS — D849 Immunodeficiency, unspecified: Secondary | ICD-10-CM | POA: Diagnosis not present

## 2022-09-03 DIAGNOSIS — R570 Cardiogenic shock: Secondary | ICD-10-CM | POA: Diagnosis not present

## 2022-09-03 DIAGNOSIS — K5900 Constipation, unspecified: Secondary | ICD-10-CM | POA: Diagnosis not present

## 2022-09-03 DIAGNOSIS — I5020 Unspecified systolic (congestive) heart failure: Secondary | ICD-10-CM | POA: Diagnosis not present

## 2022-09-03 DIAGNOSIS — G8918 Other acute postprocedural pain: Secondary | ICD-10-CM | POA: Diagnosis not present

## 2022-09-03 DIAGNOSIS — R918 Other nonspecific abnormal finding of lung field: Secondary | ICD-10-CM | POA: Diagnosis not present

## 2022-09-03 DIAGNOSIS — D62 Acute posthemorrhagic anemia: Secondary | ICD-10-CM | POA: Diagnosis not present

## 2022-09-03 DIAGNOSIS — J951 Acute pulmonary insufficiency following thoracic surgery: Secondary | ICD-10-CM | POA: Diagnosis not present

## 2022-09-03 DIAGNOSIS — Z794 Long term (current) use of insulin: Secondary | ICD-10-CM | POA: Diagnosis not present

## 2022-09-03 DIAGNOSIS — E1165 Type 2 diabetes mellitus with hyperglycemia: Secondary | ICD-10-CM | POA: Diagnosis not present

## 2022-09-03 DIAGNOSIS — N179 Acute kidney failure, unspecified: Secondary | ICD-10-CM | POA: Diagnosis not present

## 2022-09-03 DIAGNOSIS — I130 Hypertensive heart and chronic kidney disease with heart failure and stage 1 through stage 4 chronic kidney disease, or unspecified chronic kidney disease: Secondary | ICD-10-CM | POA: Diagnosis not present

## 2022-09-04 DIAGNOSIS — E1165 Type 2 diabetes mellitus with hyperglycemia: Secondary | ICD-10-CM | POA: Diagnosis not present

## 2022-09-04 DIAGNOSIS — Z4682 Encounter for fitting and adjustment of non-vascular catheter: Secondary | ICD-10-CM | POA: Diagnosis not present

## 2022-09-04 DIAGNOSIS — N1831 Chronic kidney disease, stage 3a: Secondary | ICD-10-CM | POA: Diagnosis not present

## 2022-09-04 DIAGNOSIS — Z794 Long term (current) use of insulin: Secondary | ICD-10-CM | POA: Diagnosis not present

## 2022-09-04 DIAGNOSIS — G4733 Obstructive sleep apnea (adult) (pediatric): Secondary | ICD-10-CM | POA: Diagnosis not present

## 2022-09-04 DIAGNOSIS — D849 Immunodeficiency, unspecified: Secondary | ICD-10-CM | POA: Diagnosis not present

## 2022-09-04 DIAGNOSIS — R110 Nausea: Secondary | ICD-10-CM | POA: Diagnosis not present

## 2022-09-04 DIAGNOSIS — N179 Acute kidney failure, unspecified: Secondary | ICD-10-CM | POA: Diagnosis not present

## 2022-09-04 DIAGNOSIS — R570 Cardiogenic shock: Secondary | ICD-10-CM | POA: Diagnosis not present

## 2022-09-04 DIAGNOSIS — Z941 Heart transplant status: Secondary | ICD-10-CM | POA: Diagnosis not present

## 2022-09-04 DIAGNOSIS — K5900 Constipation, unspecified: Secondary | ICD-10-CM | POA: Diagnosis not present

## 2022-09-04 DIAGNOSIS — J951 Acute pulmonary insufficiency following thoracic surgery: Secondary | ICD-10-CM | POA: Diagnosis not present

## 2022-09-04 DIAGNOSIS — D62 Acute posthemorrhagic anemia: Secondary | ICD-10-CM | POA: Diagnosis not present

## 2022-09-04 DIAGNOSIS — G8918 Other acute postprocedural pain: Secondary | ICD-10-CM | POA: Diagnosis not present

## 2022-09-04 DIAGNOSIS — I420 Dilated cardiomyopathy: Secondary | ICD-10-CM | POA: Diagnosis not present

## 2022-09-04 DIAGNOSIS — I130 Hypertensive heart and chronic kidney disease with heart failure and stage 1 through stage 4 chronic kidney disease, or unspecified chronic kidney disease: Secondary | ICD-10-CM | POA: Diagnosis not present

## 2022-09-04 DIAGNOSIS — E8770 Fluid overload, unspecified: Secondary | ICD-10-CM | POA: Diagnosis not present

## 2022-09-04 DIAGNOSIS — I428 Other cardiomyopathies: Secondary | ICD-10-CM | POA: Diagnosis not present

## 2022-09-04 DIAGNOSIS — I5020 Unspecified systolic (congestive) heart failure: Secondary | ICD-10-CM | POA: Diagnosis not present

## 2022-09-04 DIAGNOSIS — G4700 Insomnia, unspecified: Secondary | ICD-10-CM | POA: Diagnosis not present

## 2022-09-04 DIAGNOSIS — R918 Other nonspecific abnormal finding of lung field: Secondary | ICD-10-CM | POA: Diagnosis not present

## 2022-09-05 DIAGNOSIS — E1165 Type 2 diabetes mellitus with hyperglycemia: Secondary | ICD-10-CM | POA: Diagnosis not present

## 2022-09-05 DIAGNOSIS — R570 Cardiogenic shock: Secondary | ICD-10-CM | POA: Diagnosis not present

## 2022-09-05 DIAGNOSIS — Z794 Long term (current) use of insulin: Secondary | ICD-10-CM | POA: Diagnosis not present

## 2022-09-05 DIAGNOSIS — I428 Other cardiomyopathies: Secondary | ICD-10-CM | POA: Diagnosis not present

## 2022-09-05 DIAGNOSIS — Z4682 Encounter for fitting and adjustment of non-vascular catheter: Secondary | ICD-10-CM | POA: Diagnosis not present

## 2022-09-05 DIAGNOSIS — R918 Other nonspecific abnormal finding of lung field: Secondary | ICD-10-CM | POA: Diagnosis not present

## 2022-09-05 DIAGNOSIS — I420 Dilated cardiomyopathy: Secondary | ICD-10-CM | POA: Diagnosis not present

## 2022-09-05 DIAGNOSIS — Z452 Encounter for adjustment and management of vascular access device: Secondary | ICD-10-CM | POA: Diagnosis not present

## 2022-09-05 DIAGNOSIS — Z941 Heart transplant status: Secondary | ICD-10-CM | POA: Diagnosis not present

## 2022-09-06 DIAGNOSIS — J9811 Atelectasis: Secondary | ICD-10-CM | POA: Diagnosis not present

## 2022-09-06 DIAGNOSIS — I5020 Unspecified systolic (congestive) heart failure: Secondary | ICD-10-CM | POA: Diagnosis not present

## 2022-09-06 DIAGNOSIS — Z941 Heart transplant status: Secondary | ICD-10-CM | POA: Diagnosis not present

## 2022-09-06 DIAGNOSIS — I420 Dilated cardiomyopathy: Secondary | ICD-10-CM | POA: Diagnosis not present

## 2022-09-06 DIAGNOSIS — R570 Cardiogenic shock: Secondary | ICD-10-CM | POA: Diagnosis not present

## 2022-09-06 DIAGNOSIS — Z4682 Encounter for fitting and adjustment of non-vascular catheter: Secondary | ICD-10-CM | POA: Diagnosis not present

## 2022-09-06 DIAGNOSIS — J90 Pleural effusion, not elsewhere classified: Secondary | ICD-10-CM | POA: Diagnosis not present

## 2022-09-07 DIAGNOSIS — J90 Pleural effusion, not elsewhere classified: Secondary | ICD-10-CM | POA: Diagnosis not present

## 2022-09-07 DIAGNOSIS — I5020 Unspecified systolic (congestive) heart failure: Secondary | ICD-10-CM | POA: Diagnosis not present

## 2022-09-07 DIAGNOSIS — Z4682 Encounter for fitting and adjustment of non-vascular catheter: Secondary | ICD-10-CM | POA: Diagnosis not present

## 2022-09-07 DIAGNOSIS — R918 Other nonspecific abnormal finding of lung field: Secondary | ICD-10-CM | POA: Diagnosis not present

## 2022-09-07 DIAGNOSIS — Z452 Encounter for adjustment and management of vascular access device: Secondary | ICD-10-CM | POA: Diagnosis not present

## 2022-09-07 DIAGNOSIS — J986 Disorders of diaphragm: Secondary | ICD-10-CM | POA: Diagnosis not present

## 2022-09-08 DIAGNOSIS — I5020 Unspecified systolic (congestive) heart failure: Secondary | ICD-10-CM | POA: Diagnosis not present

## 2022-09-08 DIAGNOSIS — R918 Other nonspecific abnormal finding of lung field: Secondary | ICD-10-CM | POA: Diagnosis not present

## 2022-09-08 DIAGNOSIS — Z4682 Encounter for fitting and adjustment of non-vascular catheter: Secondary | ICD-10-CM | POA: Diagnosis not present

## 2022-09-08 DIAGNOSIS — Z452 Encounter for adjustment and management of vascular access device: Secondary | ICD-10-CM | POA: Diagnosis not present

## 2022-09-09 DIAGNOSIS — Z4682 Encounter for fitting and adjustment of non-vascular catheter: Secondary | ICD-10-CM | POA: Diagnosis not present

## 2022-09-09 DIAGNOSIS — E1165 Type 2 diabetes mellitus with hyperglycemia: Secondary | ICD-10-CM | POA: Diagnosis not present

## 2022-09-09 DIAGNOSIS — E1122 Type 2 diabetes mellitus with diabetic chronic kidney disease: Secondary | ICD-10-CM | POA: Diagnosis not present

## 2022-09-09 DIAGNOSIS — G4731 Primary central sleep apnea: Secondary | ICD-10-CM | POA: Diagnosis not present

## 2022-09-09 DIAGNOSIS — Z794 Long term (current) use of insulin: Secondary | ICD-10-CM | POA: Diagnosis not present

## 2022-09-09 DIAGNOSIS — Z4821 Encounter for aftercare following heart transplant: Secondary | ICD-10-CM | POA: Diagnosis not present

## 2022-09-09 DIAGNOSIS — Z452 Encounter for adjustment and management of vascular access device: Secondary | ICD-10-CM | POA: Diagnosis not present

## 2022-09-09 DIAGNOSIS — Z941 Heart transplant status: Secondary | ICD-10-CM | POA: Diagnosis not present

## 2022-09-09 DIAGNOSIS — G4733 Obstructive sleep apnea (adult) (pediatric): Secondary | ICD-10-CM | POA: Diagnosis not present

## 2022-09-09 DIAGNOSIS — T8621 Heart transplant rejection: Secondary | ICD-10-CM | POA: Diagnosis not present

## 2022-09-09 DIAGNOSIS — N189 Chronic kidney disease, unspecified: Secondary | ICD-10-CM | POA: Diagnosis not present

## 2022-09-09 DIAGNOSIS — Z9989 Dependence on other enabling machines and devices: Secondary | ICD-10-CM | POA: Diagnosis not present

## 2022-09-09 DIAGNOSIS — I5020 Unspecified systolic (congestive) heart failure: Secondary | ICD-10-CM | POA: Diagnosis not present

## 2022-09-09 DIAGNOSIS — R918 Other nonspecific abnormal finding of lung field: Secondary | ICD-10-CM | POA: Diagnosis not present

## 2022-09-09 DIAGNOSIS — J449 Chronic obstructive pulmonary disease, unspecified: Secondary | ICD-10-CM | POA: Diagnosis not present

## 2022-09-10 DIAGNOSIS — Z941 Heart transplant status: Secondary | ICD-10-CM | POA: Diagnosis not present

## 2022-09-10 DIAGNOSIS — D849 Immunodeficiency, unspecified: Secondary | ICD-10-CM | POA: Diagnosis not present

## 2022-09-10 DIAGNOSIS — R918 Other nonspecific abnormal finding of lung field: Secondary | ICD-10-CM | POA: Diagnosis not present

## 2022-09-10 DIAGNOSIS — T380X5A Adverse effect of glucocorticoids and synthetic analogues, initial encounter: Secondary | ICD-10-CM | POA: Diagnosis not present

## 2022-09-10 DIAGNOSIS — I5020 Unspecified systolic (congestive) heart failure: Secondary | ICD-10-CM | POA: Diagnosis not present

## 2022-09-10 DIAGNOSIS — R739 Hyperglycemia, unspecified: Secondary | ICD-10-CM | POA: Diagnosis not present

## 2022-09-11 DIAGNOSIS — I5020 Unspecified systolic (congestive) heart failure: Secondary | ICD-10-CM | POA: Diagnosis not present

## 2022-09-11 DIAGNOSIS — R918 Other nonspecific abnormal finding of lung field: Secondary | ICD-10-CM | POA: Diagnosis not present

## 2022-09-11 DIAGNOSIS — Z452 Encounter for adjustment and management of vascular access device: Secondary | ICD-10-CM | POA: Diagnosis not present

## 2022-09-11 DIAGNOSIS — Z4682 Encounter for fitting and adjustment of non-vascular catheter: Secondary | ICD-10-CM | POA: Diagnosis not present

## 2022-09-16 DIAGNOSIS — Z2989 Encounter for other specified prophylactic measures: Secondary | ICD-10-CM | POA: Diagnosis not present

## 2022-09-16 DIAGNOSIS — Z79899 Other long term (current) drug therapy: Secondary | ICD-10-CM | POA: Diagnosis not present

## 2022-09-16 DIAGNOSIS — T86298 Other complications of heart transplant: Secondary | ICD-10-CM | POA: Diagnosis not present

## 2022-09-16 DIAGNOSIS — I5189 Other ill-defined heart diseases: Secondary | ICD-10-CM | POA: Diagnosis not present

## 2022-09-16 DIAGNOSIS — D84821 Immunodeficiency due to drugs: Secondary | ICD-10-CM | POA: Diagnosis not present

## 2022-09-16 DIAGNOSIS — G4700 Insomnia, unspecified: Secondary | ICD-10-CM | POA: Diagnosis not present

## 2022-09-16 DIAGNOSIS — Z79624 Long term (current) use of inhibitors of nucleotide synthesis: Secondary | ICD-10-CM | POA: Diagnosis not present

## 2022-09-16 DIAGNOSIS — Z48298 Encounter for aftercare following other organ transplant: Secondary | ICD-10-CM | POA: Diagnosis not present

## 2022-09-16 DIAGNOSIS — R112 Nausea with vomiting, unspecified: Secondary | ICD-10-CM | POA: Diagnosis not present

## 2022-09-16 DIAGNOSIS — R1013 Epigastric pain: Secondary | ICD-10-CM | POA: Diagnosis not present

## 2022-09-16 DIAGNOSIS — Z941 Heart transplant status: Secondary | ICD-10-CM | POA: Diagnosis not present

## 2022-09-16 DIAGNOSIS — Z4821 Encounter for aftercare following heart transplant: Secondary | ICD-10-CM | POA: Diagnosis not present

## 2022-09-19 DIAGNOSIS — Z48298 Encounter for aftercare following other organ transplant: Secondary | ICD-10-CM | POA: Diagnosis not present

## 2022-09-19 DIAGNOSIS — Z2989 Encounter for other specified prophylactic measures: Secondary | ICD-10-CM | POA: Diagnosis not present

## 2022-09-19 DIAGNOSIS — M7989 Other specified soft tissue disorders: Secondary | ICD-10-CM | POA: Diagnosis not present

## 2022-09-19 DIAGNOSIS — Z941 Heart transplant status: Secondary | ICD-10-CM | POA: Diagnosis not present

## 2022-09-19 DIAGNOSIS — Z79899 Other long term (current) drug therapy: Secondary | ICD-10-CM | POA: Diagnosis not present

## 2022-09-23 DIAGNOSIS — Z48298 Encounter for aftercare following other organ transplant: Secondary | ICD-10-CM | POA: Diagnosis not present

## 2022-09-23 DIAGNOSIS — Z79899 Other long term (current) drug therapy: Secondary | ICD-10-CM | POA: Diagnosis not present

## 2022-09-23 DIAGNOSIS — Z941 Heart transplant status: Secondary | ICD-10-CM | POA: Diagnosis not present

## 2022-09-23 DIAGNOSIS — Z2989 Encounter for other specified prophylactic measures: Secondary | ICD-10-CM | POA: Diagnosis not present

## 2022-09-25 DIAGNOSIS — J9811 Atelectasis: Secondary | ICD-10-CM | POA: Diagnosis not present

## 2022-09-25 DIAGNOSIS — Z7982 Long term (current) use of aspirin: Secondary | ICD-10-CM | POA: Diagnosis not present

## 2022-09-25 DIAGNOSIS — N183 Chronic kidney disease, stage 3 unspecified: Secondary | ICD-10-CM | POA: Diagnosis not present

## 2022-09-25 DIAGNOSIS — Z4821 Encounter for aftercare following heart transplant: Secondary | ICD-10-CM | POA: Diagnosis not present

## 2022-09-25 DIAGNOSIS — Z941 Heart transplant status: Secondary | ICD-10-CM | POA: Diagnosis not present

## 2022-09-25 DIAGNOSIS — I4581 Long QT syndrome: Secondary | ICD-10-CM | POA: Diagnosis not present

## 2022-09-30 DIAGNOSIS — Z48298 Encounter for aftercare following other organ transplant: Secondary | ICD-10-CM | POA: Diagnosis not present

## 2022-09-30 DIAGNOSIS — I10 Essential (primary) hypertension: Secondary | ICD-10-CM | POA: Diagnosis not present

## 2022-09-30 DIAGNOSIS — Z79899 Other long term (current) drug therapy: Secondary | ICD-10-CM | POA: Diagnosis not present

## 2022-09-30 DIAGNOSIS — Z79624 Long term (current) use of inhibitors of nucleotide synthesis: Secondary | ICD-10-CM | POA: Diagnosis not present

## 2022-09-30 DIAGNOSIS — G4700 Insomnia, unspecified: Secondary | ICD-10-CM | POA: Diagnosis not present

## 2022-09-30 DIAGNOSIS — Z2989 Encounter for other specified prophylactic measures: Secondary | ICD-10-CM | POA: Diagnosis not present

## 2022-09-30 DIAGNOSIS — Z7982 Long term (current) use of aspirin: Secondary | ICD-10-CM | POA: Diagnosis not present

## 2022-09-30 DIAGNOSIS — D84821 Immunodeficiency due to drugs: Secondary | ICD-10-CM | POA: Diagnosis not present

## 2022-09-30 DIAGNOSIS — L03114 Cellulitis of left upper limb: Secondary | ICD-10-CM | POA: Diagnosis not present

## 2022-09-30 DIAGNOSIS — Z7952 Long term (current) use of systemic steroids: Secondary | ICD-10-CM | POA: Diagnosis not present

## 2022-09-30 DIAGNOSIS — Z941 Heart transplant status: Secondary | ICD-10-CM | POA: Diagnosis not present

## 2022-09-30 DIAGNOSIS — I081 Rheumatic disorders of both mitral and tricuspid valves: Secondary | ICD-10-CM | POA: Diagnosis not present

## 2022-09-30 DIAGNOSIS — Z79621 Long term (current) use of calcineurin inhibitor: Secondary | ICD-10-CM | POA: Diagnosis not present

## 2022-09-30 DIAGNOSIS — Z7984 Long term (current) use of oral hypoglycemic drugs: Secondary | ICD-10-CM | POA: Diagnosis not present

## 2022-10-01 DIAGNOSIS — E118 Type 2 diabetes mellitus with unspecified complications: Secondary | ICD-10-CM | POA: Diagnosis not present

## 2022-10-01 DIAGNOSIS — Z794 Long term (current) use of insulin: Secondary | ICD-10-CM | POA: Diagnosis not present

## 2022-10-03 DIAGNOSIS — Z79899 Other long term (current) drug therapy: Secondary | ICD-10-CM | POA: Diagnosis not present

## 2022-10-03 DIAGNOSIS — Z941 Heart transplant status: Secondary | ICD-10-CM | POA: Diagnosis not present

## 2022-10-03 DIAGNOSIS — Z2989 Encounter for other specified prophylactic measures: Secondary | ICD-10-CM | POA: Diagnosis not present

## 2022-10-03 DIAGNOSIS — Z48298 Encounter for aftercare following other organ transplant: Secondary | ICD-10-CM | POA: Diagnosis not present

## 2022-10-03 DIAGNOSIS — E119 Type 2 diabetes mellitus without complications: Secondary | ICD-10-CM | POA: Diagnosis not present

## 2022-10-03 DIAGNOSIS — R112 Nausea with vomiting, unspecified: Secondary | ICD-10-CM | POA: Diagnosis not present

## 2022-10-04 DIAGNOSIS — Z79624 Long term (current) use of inhibitors of nucleotide synthesis: Secondary | ICD-10-CM | POA: Diagnosis not present

## 2022-10-04 DIAGNOSIS — E8809 Other disorders of plasma-protein metabolism, not elsewhere classified: Secondary | ICD-10-CM | POA: Diagnosis not present

## 2022-10-04 DIAGNOSIS — I129 Hypertensive chronic kidney disease with stage 1 through stage 4 chronic kidney disease, or unspecified chronic kidney disease: Secondary | ICD-10-CM | POA: Diagnosis not present

## 2022-10-04 DIAGNOSIS — J449 Chronic obstructive pulmonary disease, unspecified: Secondary | ICD-10-CM | POA: Diagnosis not present

## 2022-10-04 DIAGNOSIS — G4733 Obstructive sleep apnea (adult) (pediatric): Secondary | ICD-10-CM | POA: Diagnosis not present

## 2022-10-04 DIAGNOSIS — K2100 Gastro-esophageal reflux disease with esophagitis, without bleeding: Secondary | ICD-10-CM | POA: Diagnosis not present

## 2022-10-04 DIAGNOSIS — D62 Acute posthemorrhagic anemia: Secondary | ICD-10-CM | POA: Diagnosis not present

## 2022-10-04 DIAGNOSIS — D649 Anemia, unspecified: Secondary | ICD-10-CM | POA: Diagnosis not present

## 2022-10-04 DIAGNOSIS — Z87891 Personal history of nicotine dependence: Secondary | ICD-10-CM | POA: Diagnosis not present

## 2022-10-04 DIAGNOSIS — Z7982 Long term (current) use of aspirin: Secondary | ICD-10-CM | POA: Diagnosis not present

## 2022-10-04 DIAGNOSIS — Z79621 Long term (current) use of calcineurin inhibitor: Secondary | ICD-10-CM | POA: Diagnosis not present

## 2022-10-04 DIAGNOSIS — R918 Other nonspecific abnormal finding of lung field: Secondary | ICD-10-CM | POA: Diagnosis not present

## 2022-10-04 DIAGNOSIS — K921 Melena: Secondary | ICD-10-CM | POA: Diagnosis not present

## 2022-10-04 DIAGNOSIS — E875 Hyperkalemia: Secondary | ICD-10-CM | POA: Diagnosis not present

## 2022-10-04 DIAGNOSIS — Z8711 Personal history of peptic ulcer disease: Secondary | ICD-10-CM | POA: Diagnosis not present

## 2022-10-04 DIAGNOSIS — D84821 Immunodeficiency due to drugs: Secondary | ICD-10-CM | POA: Diagnosis not present

## 2022-10-04 DIAGNOSIS — Z9225 Personal history of immunosupression therapy: Secondary | ICD-10-CM | POA: Diagnosis not present

## 2022-10-04 DIAGNOSIS — E1122 Type 2 diabetes mellitus with diabetic chronic kidney disease: Secondary | ICD-10-CM | POA: Diagnosis not present

## 2022-10-04 DIAGNOSIS — Z794 Long term (current) use of insulin: Secondary | ICD-10-CM | POA: Diagnosis not present

## 2022-10-04 DIAGNOSIS — K922 Gastrointestinal hemorrhage, unspecified: Secondary | ICD-10-CM | POA: Diagnosis not present

## 2022-10-04 DIAGNOSIS — D509 Iron deficiency anemia, unspecified: Secondary | ICD-10-CM | POA: Diagnosis not present

## 2022-10-04 DIAGNOSIS — Z941 Heart transplant status: Secondary | ICD-10-CM | POA: Diagnosis not present

## 2022-10-04 DIAGNOSIS — E785 Hyperlipidemia, unspecified: Secondary | ICD-10-CM | POA: Diagnosis not present

## 2022-10-04 DIAGNOSIS — D631 Anemia in chronic kidney disease: Secondary | ICD-10-CM | POA: Diagnosis not present

## 2022-10-04 DIAGNOSIS — N179 Acute kidney failure, unspecified: Secondary | ICD-10-CM | POA: Diagnosis not present

## 2022-10-04 DIAGNOSIS — N1831 Chronic kidney disease, stage 3a: Secondary | ICD-10-CM | POA: Diagnosis not present

## 2022-10-04 DIAGNOSIS — Z79899 Other long term (current) drug therapy: Secondary | ICD-10-CM | POA: Diagnosis not present

## 2022-10-04 DIAGNOSIS — Z7952 Long term (current) use of systemic steroids: Secondary | ICD-10-CM | POA: Diagnosis not present

## 2022-10-04 DIAGNOSIS — Z792 Long term (current) use of antibiotics: Secondary | ICD-10-CM | POA: Diagnosis not present

## 2022-10-04 DIAGNOSIS — R0602 Shortness of breath: Secondary | ICD-10-CM | POA: Diagnosis not present

## 2022-10-05 DIAGNOSIS — D649 Anemia, unspecified: Secondary | ICD-10-CM | POA: Diagnosis not present

## 2022-10-05 DIAGNOSIS — Z941 Heart transplant status: Secondary | ICD-10-CM | POA: Diagnosis not present

## 2022-10-05 DIAGNOSIS — K921 Melena: Secondary | ICD-10-CM | POA: Diagnosis not present

## 2022-10-05 DIAGNOSIS — K922 Gastrointestinal hemorrhage, unspecified: Secondary | ICD-10-CM | POA: Diagnosis not present

## 2022-10-06 DIAGNOSIS — K921 Melena: Secondary | ICD-10-CM | POA: Diagnosis not present

## 2022-10-06 DIAGNOSIS — D62 Acute posthemorrhagic anemia: Secondary | ICD-10-CM | POA: Diagnosis not present

## 2022-10-06 DIAGNOSIS — K922 Gastrointestinal hemorrhage, unspecified: Secondary | ICD-10-CM | POA: Diagnosis not present

## 2022-10-07 ENCOUNTER — Telehealth: Payer: Self-pay

## 2022-10-07 NOTE — Telephone Encounter (Signed)
It says message hidden-- I cannot access then message

## 2022-10-07 NOTE — Telephone Encounter (Signed)
Transition Care Management Follow-up Telephone Call Date of discharge and from where:   [facility] 10-06-22 Dx: Acute blood loss anemia   How have you been since you were released from the hospital? Feeling ok Any questions or concerns? No  Items Reviewed: Did the pt receive and understand the discharge instructions provided? Yes  Medications obtained and verified? Yes  Other? No  Any new allergies since your discharge? No  Dietary orders reviewed? Yes Do you have support at home? Yes   Home Care and Equipment/Supplies: Were home health services ordered? no If so, what is the name of the agency? na  Has the agency set up a time to come to the patient's home? not applicable Were any new equipment or medical supplies ordered?  No What is the name of the medical supply agency? na Were you able to get the supplies/equipment? not applicable Do you have any questions related to the use of the equipment or supplies? No  Functional Questionnaire: (I = Independent and D = Dependent) ADLs: I  Bathing/Dressing- I  Meal Prep- I  Eating- I  Maintaining continence- I  Transferring/Ambulation- I  Managing Meds- I  Follow up appointments reviewed:  PCP Hospital f/u appt confirmed? Yes  Scheduled to see Dr Legrand Como on 10-16-22 @ [facility] f/u appt confirmed? Yes  at [facility] . Are transportation arrangements needed? No  If their condition worsens, is the pt aware to call PCP or go to the Emergency Dept.? Yes Was the patient provided with contact information for the PCP's office or ED? Yes Was to pt encouraged to call back with questions or concerns? Yes   Juanda Crumble LPN [place] Direct Dial [phone]

## 2022-10-09 DIAGNOSIS — G4731 Primary central sleep apnea: Secondary | ICD-10-CM | POA: Diagnosis not present

## 2022-10-09 DIAGNOSIS — D62 Acute posthemorrhagic anemia: Secondary | ICD-10-CM | POA: Diagnosis not present

## 2022-10-11 ENCOUNTER — Telehealth: Payer: Self-pay | Admitting: Pharmacist

## 2022-10-11 NOTE — Chronic Care Management (AMB) (Unsigned)
Chronic Care Management Pharmacy Assistant   Name: Karter Hellmer  MRN: [id number] DOB: 1961-04-09  Reason for Encounter: Disease State / Hypertension Assessment Call   Conditions to be addressed/monitored: HTN  Recent office visits:  None  Recent consult visits:  10/03/2022 Dola Factor [facility]) - Patient was seen for type 2 diabetes mellitus. No additional chart notes.   10/01/2022 Waldon Reining [place] [facility]) - Patient was seen for type 2 diabetes mellitus. No additional chart notes.  09/30/2022 Lattie Haw ([facility]) - Patient was seen for ([facility]) - Patient was seen for type 2 diabetes mellitus. No additional chart notes. No additional chart notes.   09/23/2022  Lattie Haw ([facility]) - Patient was seen for ([facility]) - Patient was seen for type 2 diabetes mellitus. No additional chart notes. No additional chart notes.  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  Hospital visits:  Admitted to [facility] on 10/04/2022 due to GI hemorrhage, unspecified gastrointestinal hemorrhage type. Discharge date was 10/06/2022.    New?Medications Started at [facility] Discharge:?? No new medications Medication Changes at Hospital Discharge: No medication changes.  Medications Discontinued at Hospital Discharge: Aspirin Prednisone Medications that remain the same after Hospital Discharge:??  -All other medications will remain the same.     Admitted to [facility] on 08/24/2022 due to [facility]. Discharge date was 09/11/2022.    New?Medications Started at [facility] Discharge:?? acetaminophen (TYLENOL) 325 MG tablet  Take 2 tablets (650 mg total) by mouth every 6 (six) hours Qty: 200 tablet, Refills: 11 metFORMIN (GLUCOPHAGE-XR) 500 MG XR tablet Take 2 tablets (1,000 mg total) by mouth 2 (two) times daily Qty: 120 tablet, Refills: 3 mycophenolate (CELLCEPT) 250 mg capsule Take 4 capsules (1,000 mg total) by mouth every 12 (twelve) hours Qty: 240 capsule, Refills: 11 nystatin (MYCOSTATIN) 100,000 unit/mL suspension Swish and swallow 5 mLs 4 (four) times daily Qty: 600 mL, Refills: 5 oxyCODONE (ROXICODONE) 5 MG immediate release tablet Take 1-2 tablets (5-10 mg total) by mouth every 6 (six) hours as needed for Pain Qty: 56 tablet, Refills: 0 pantoprazole (PROTONIX) 40 MG DR tablet Take 1 tablet (40 mg total) by mouth once daily Qty: 30 tablet, Refills: 11 predniSONE (DELTASONE) 5 MG tablet Take 40m twice daily 11/7&11/8, 240mtwice daily 11/9&11/10, 1568mwice daily 11/11&11/12, 74m22mice daily 11/13-11/27, 15mg70mly 11/28 until told to change. Qty: 200 tablet, Refills: 11 sennosides-docusate (SENOKOT-S) 8.6-50 mg tablet Take 2 tablets by mouth 2 (two) times daily Qty: 120 tablet, Refills: 11 sulfamethoxazole-trimethoprim (BACTRIM SS) 400-80 mg tablet Take 1 tablet (80 mg of trimethoprim total) by mouth once daily Qty: 30 tablet, Refills: 11 tacrolimus (PROGRAF) 1 MG capsule Take 7 capsules (7 mg total) by mouth every 12 (twelve) hours Qty: 420 capsule, Refills: 11 valACYclovir (VALTREX) 500 MG tablet Take 1 tablet (500 mg total) by mouth 2 (two) times daily for 87 days Qty: 60 tablet, Refills: 2 calcium carbonate-vitamin D3 (CALTRATE 600+D) 600 mg-10 mcg (400 unit) tablet Take 1 tablet by mouth 2 (two) times daily with meals Qty: 60 tablet, Refills: 11 magnesium oxide (MAG-OX) 400 mg (241.3 mg magnesium) tablet Take 1 tablet (400 mg total) by mouth 2 (two) times daily Qty: 120 tablet, Refills:  11 pravastatin (PRAVACHOL) 40 MG tablet Take 1 tablet (40 mg total) by mouth at bedtime Qty: 30 tablet,  Refills: 11 Medication Changes at Hospital Discharge: No medication changes.  Medications Discontinued at Hospital Discharge: albuterol 90 mcg/actuation inhaler AMIOdarone (PACERONE) 200 MG tablet amoxicillin (AMOXIL) 500 MG capsule apixaban (ELIQUIS) 5 mg tablet budesonide-glycopyrrolate-formoterol (BREZTRI AEROSPHERE) 160-9-4.8 mcg/actuation inhaler carvediloL (COREG) 12.5 MG tablet digoxin (LANOXIN) 0.125 MG tablet empagliflozin (JARDIANCE) 10 mg tablet MILrinone in dextrose 5% (PRIMACOR) 20 mg/100 mL infusion omeprazole (PRILOSEC) 40 MG DR capsule ondansetron (ZOFRAN-ODT) 8 MG disintegrating tablet sacubitriL-valsartan (ENTRESTO) 24-26 mg tablet sildenafiL (VIAGRA) 50 MG tablet simvastatin (ZOCOR) 20 MG tablet Medications that remain the same after Hospital Discharge:??  -All other medications will remain the same.     Admitted to [facility] on 08/20/2022 due to Acute on chronic systolic heart failure, NYHA class 4. Discharge date was 08/23/2022.    New?Medications Started at [facility] Discharge:?? No new medications Medication Changes at Hospital Discharge: No medication changes.  Medications Discontinued at Hospital Discharge: Eliquis 5 MG Tabs tablet (apixaban) milrinone 20 MG/100 ML Soln infusion (PRIMACOR) Medications that remain the same after Hospital Discharge:??  -All other medications will remain the same.    Medications: Outpatient Encounter Medications as of 10/11/2022  Medication Sig   ACCU-CHEK GUIDE test strip Use as directed-patient needs an appt   Accu-Chek Softclix Lancets lancets Use as directed   albuterol (VENTOLIN HFA) 108 (90 Base) MCG/ACT inhaler Inhale 2 puffs into the lungs every 6 (six) hours as needed for wheezing or shortness of breath. (Patient not taking: Reported on 10/07/2022)   amiodarone (PACERONE) 200 MG tablet Take 1 tablet (200 mg total) by mouth daily.   atorvastatin (LIPITOR) 40 MG tablet Take 1 tablet (40 mg total) by  mouth daily.   blood glucose meter kit and supplies KIT Dispense based on patient and insurance preference. Use up to four times daily as directed.   Budeson-Glycopyrrol-Formoterol (BREZTRI AEROSPHERE) 160-9-4.8 MCG/ACT AERO Inhale 2 puffs into the lungs 2 (two) times daily. (Patient not taking: Reported on 10/07/2022)   digoxin (LANOXIN) 0.125 MG tablet Take 1 tablet (0.125 mg total) by mouth daily. (Patient not taking: Reported on 10/07/2022)   JARDIANCE 10 MG TABS tablet Take 1 tablet (10 mg total) by mouth daily. (Patient not taking: Reported on 10/07/2022)   omeprazole (PRILOSEC) 40 MG capsule Take 1 capsule (40 mg total) by mouth daily.   Spacer/Aero-Holding Chambers (AEROCHAMBER PLUS) inhaler Use as instructed   spironolactone (ALDACTONE) 25 MG tablet Take 25 mg by mouth daily. (Patient not taking: Reported on 10/07/2022)   torsemide (DEMADEX) 20 MG tablet Take 2 tablets (40 mg total) by mouth daily. (Patient not taking: Reported on 10/07/2022)   No facility-administered encounter medications on file as of 10/11/2022.  Fill History from [place] on 09/11/2022 acetaminophen (TYLENOL) 325 MG tablet Take 2 tablets (650 mg total) by mouth every 6 (six) hours Qty: 200 tablet, Refills: 11 metFORMIN (GLUCOPHAGE-XR) 500 MG XR tablet Take 2 tablets (1,000 mg total) by mouth 2 (two) times daily Qty: 120 tablet, Refills: 3 mycophenolate (CELLCEPT) 250 mg capsule Take 4 capsules (1,000 mg total) by mouth every 12 (twelve) hours Qty: 240 capsule, Refills: 11 nystatin (MYCOSTATIN) 100,000 unit/mL suspension Swish and swallow 5 mLs 4 (four) times daily Qty: 600 mL, Refills: 5 oxyCODONE (ROXICODONE) 5 MG immediate release tablet Take 1-2 tablets (5-10 mg total) by mouth every 6 (six) hours as needed for Pain Qty: 56 tablet, Refills:  0 pantoprazole (PROTONIX) 40 MG DR tablet Take 1 tablet (40 mg total) by mouth once daily Qty: 30 tablet, Refills: 11 predniSONE (DELTASONE) 5 MG tablet Take 50m twice daily  11/7&11/8, 221mtwice daily 11/9&11/10, 1550mwice daily 11/11&11/12, 45m62mice daily 11/13-11/27, 15mg27mly 11/28 until told to change. Qty: 200 tablet, Refills: 11 sennosides-docusate (SENOKOT-S) 8.6-50 mg tablet Take 2 tablets by mouth 2 (two) times daily Qty: 120 tablet, Refills: 11 sulfamethoxazole-trimethoprim (BACTRIM SS) 400-80 mg tablet Take 1 tablet (80 mg of trimethoprim total) by mouth once daily Qty: 30 tablet, Refills: 11 tacrolimus (PROGRAF) 1 MG capsule Take 7 capsules (7 mg total) by mouth every 12 (twelve) hours Qty: 420 capsule, Refills: 11 valACYclovir (VALTREX) 500 MG tablet Take 1 tablet (500 mg total) by mouth 2 (two) times daily for 87 days Qty: 60 tablet, Refills: 2 calcium carbonate-vitamin D3 (CALTRATE 600+D) 600 mg-10 mcg (400 unit) tablet Take 1 tablet by mouth 2 (two) times daily with meals Qty: 60 tablet, Refills: 11 magnesium oxide (MAG-OX) 400 mg (241.3 mg magnesium) tablet Take 1 tablet (400 mg total) by mouth 2 (two) times daily Qty: 120 tablet, Refills: 11 pravastatin (PRAVACHOL) 40 MG tablet Take 1 tablet (40 mg total) by mouth at bedtime Qty: 30 tablet, Refills: 11  Reviewed chart prior to disease state call. Spoke with patient regarding BP  Recent Office Vitals: BP Readings from Last 3 Encounters:  08/23/22 (!) 110/95  08/19/22 104/60  06/24/22 100/68   Pulse Readings from Last 3 Encounters:  08/23/22 (!) 59  08/19/22 60  06/24/22 (!) 51    Wt Readings from Last 3 Encounters:  08/23/22 176 lb 5.9 oz (80 kg)  08/19/22 182 lb (82.6 kg)  06/24/22 189 lb 6.4 oz (85.9 kg)     Kidney Function Lab Results  Component Value Date/Time   CREATININE 1.68 (H) 08/23/2022 04:13 AM   CREATININE 1.91 (H) 08/22/2022 04:27 AM   CREATININE 1.60 (H) 02/11/2020 04:41 PM   CREATININE 1.45 (H) 01/23/2017 10:52 AM   GFR 47.04 (L) 01/03/2022 08:22 AM   GFRNONAA 47 (L) 08/23/2022 04:13 AM   GFRAA 42 (L) 07/24/2020 11:07 AM       Latest Ref Rng & Units  08/23/2022    4:27 AM 08/23/2022    4:13 AM 08/22/2022    1:56 PM  BMP  Glucose 70 - 99 mg/dL  142    BUN 6 - 20 mg/dL  26    Creatinine 0.61 - 1.24 mg/dL  1.68    Sodium 135 - 145 mmol/L 127  132  131   Potassium 3.5 - 5.1 mmol/L 4.7  4.8  4.1   Chloride 98 - 111 mmol/L  92    CO2 22 - 32 mmol/L  26    Calcium 8.9 - 10.3 mg/dL  9.7     Notes for follow up from [facility]:  Unable to reach patient.   Current antihypertensive regimen:  No blood pressure medications  How often are you checking your Blood Pressure?   Current home BP readings:   What recent interventions/DTPs have been made by any provider to improve Blood Pressure control since last CPP Visit: See [facility] above.   Any recent hospitalizations or ED visits since last visit with CPP? See [facility] above.  What diet changes have been made to improve Blood Pressure Control?    What exercise is being done to improve your Blood Pressure Control?    Adherence Review: Is the  patient currently on ACE/ARB medication? No Does the patient have >5 day gap between last estimated fill dates? No   Unable to reach patient after several attempts.   Care Gaps: AWV - scheduled 11/26/2022 Last BP - 129/88 on 10/06/2022 [facility] Last A1C - 5.5 on 08/24/2022 [place] exam - never done Eye exam - never done Tdap - never done Shingrix - never done Covid - overdue AWV - due soon  Star Rating Drugs: Pravastatin 40 mg - last filled 10/04/2022 30 DS [place] Metformin 500 mg - last filled 09/11/2022 30 DS [place]  Clinical Pharmacist Assistant [phone]

## 2022-10-14 DIAGNOSIS — Z79624 Long term (current) use of inhibitors of nucleotide synthesis: Secondary | ICD-10-CM | POA: Diagnosis not present

## 2022-10-14 DIAGNOSIS — G4700 Insomnia, unspecified: Secondary | ICD-10-CM | POA: Diagnosis not present

## 2022-10-14 DIAGNOSIS — R109 Unspecified abdominal pain: Secondary | ICD-10-CM | POA: Diagnosis not present

## 2022-10-14 DIAGNOSIS — I10 Essential (primary) hypertension: Secondary | ICD-10-CM | POA: Diagnosis not present

## 2022-10-14 DIAGNOSIS — R112 Nausea with vomiting, unspecified: Secondary | ICD-10-CM | POA: Diagnosis not present

## 2022-10-14 DIAGNOSIS — D849 Immunodeficiency, unspecified: Secondary | ICD-10-CM | POA: Diagnosis not present

## 2022-10-14 DIAGNOSIS — Z2989 Encounter for other specified prophylactic measures: Secondary | ICD-10-CM | POA: Diagnosis not present

## 2022-10-14 DIAGNOSIS — Z941 Heart transplant status: Secondary | ICD-10-CM | POA: Diagnosis not present

## 2022-10-14 DIAGNOSIS — Z79899 Other long term (current) drug therapy: Secondary | ICD-10-CM | POA: Diagnosis not present

## 2022-10-14 DIAGNOSIS — R0789 Other chest pain: Secondary | ICD-10-CM | POA: Diagnosis not present

## 2022-10-14 DIAGNOSIS — Z48298 Encounter for aftercare following other organ transplant: Secondary | ICD-10-CM | POA: Diagnosis not present

## 2022-10-14 DIAGNOSIS — Z4821 Encounter for aftercare following heart transplant: Secondary | ICD-10-CM | POA: Diagnosis not present

## 2022-10-14 DIAGNOSIS — D84821 Immunodeficiency due to drugs: Secondary | ICD-10-CM | POA: Diagnosis not present

## 2022-10-16 ENCOUNTER — Ambulatory Visit (INDEPENDENT_AMBULATORY_CARE_PROVIDER_SITE_OTHER): Payer: Medicare Other | Admitting: Family Medicine

## 2022-10-16 ENCOUNTER — Encounter: Payer: Self-pay | Admitting: Family Medicine

## 2022-10-16 VITALS — BP 116/76 | HR 94 | Temp 98.2°F | Ht 72.0 in | Wt 192.9 lb

## 2022-10-16 DIAGNOSIS — E118 Type 2 diabetes mellitus with unspecified complications: Secondary | ICD-10-CM | POA: Diagnosis not present

## 2022-10-16 DIAGNOSIS — F5104 Psychophysiologic insomnia: Secondary | ICD-10-CM

## 2022-10-16 MED ORDER — BELSOMRA 10 MG PO TABS: 10.0000 mg | ORAL_TABLET | Freq: Every day | ORAL | 1 refills | Status: DC

## 2022-10-16 NOTE — Assessment & Plan Note (Signed)
Patient is on novolog 4 unit TID with meals, last A1C was done in October in the hospital and it was 5.5. he reports some confusion about when to take an injection. We discussed switching him to a CGM and he is agreeable. I gave him 1 month of freestyle libre 3 sensors and I will place order for CGM's. He is already being followed by an endocrinologist since the heart transplant in October. Will continue to follow with them. Foot exam performed today and is normal

## 2022-10-16 NOTE — Progress Notes (Signed)
Established Patient Office Visit  Subjective   Patient ID: Bruce Robertson, male    DOB: 03/01/1961  Age: 61 y.o. MRN: [id number]  Chief Complaint  Patient presents with   Hospitalization Follow-up    Patient is here for hospital discharge follow up and for TOC visit.   Patient was hospitalized twice, once for heart transplant in October 2023 and then again Dec 1 for acute GI bleed. Pt was transfused 3 units which improved his symptoms. The melena resolved. Endoscopy was unremarkable. He was previously taking aspirin after his heart transplant and this was thought to be the cause of the bleeding. He has had melena and BRBPR in the past and has a history of hemorrhoids as well. Hb initially was 6.3 and Hb at discharge was 10.9, he will continue to follow up with his GI doctor. Pt reports that he is feeling very good, denies any abdominal pain, no nausea or vomiting, no other associated symptoms or issues.  Pt reports he is feeling well after his heart transplant.  states that he is going to the transplant center every 2 weeks, then will go once a month. Patient states that he sees an endocrinologist for his diabetes. States that he had a video visit with him about 2 weeks ago. His A1C in the hospital was 5.5 on 08/24/22. Currently takes short acting insulin only 4 units with each meal as needed.   Patient reports that he cannot sleep at night. States that since his transplant his insomnia has gotten much worse. States that he can go 2 days without sleeping, not really nervous or worrying, states that he isn't having any racing thoughts, he just can't fall asleep. States that he was being given melatonin and trazodone 50 mg in the hospital however this is nor working for him. States he has continued the rx's at home and has    Current Outpatient Medications  Medication Instructions   ACCU-CHEK GUIDE test strip Use as directed-patient needs an appt   Accu-Chek Softclix Lancets lancets Use as  directed   albuterol (VENTOLIN HFA) 108 (90 Base) MCG/ACT inhaler 2 puffs, Inhalation, Every 6 hours PRN   Belsomra 10 mg, Oral, Daily at bedtime   blood glucose meter kit and supplies KIT Dispense based on patient and insurance preference. Use up to four times daily as directed.   digoxin (LANOXIN) 0.125 mg, Oral, Daily   insulin lispro (HUMALOG) 4 Units, Subcutaneous, 3 times daily before meals   mycophenolate (MYFORTIC) 720 mg, Oral, 2 times daily   NYSTATIN PO 5 mLs, Oral, 4 times daily   omeprazole (PRILOSEC) 40 mg, Oral, Daily   ondansetron (ZOFRAN-ODT) 4 mg, Oral, Every 8 hours PRN   pantoprazole (PROTONIX) 40 mg, Oral, Daily   pravastatin (PRAVACHOL) 40 mg, Oral, Daily at bedtime   predniSONE (DELTASONE) 5 mg, Oral, Daily with breakfast   sulfamethoxazole-trimethoprim (BACTRIM DS) 800-160 MG tablet 1 tablet, Oral,  Once   tacrolimus (PROGRAF) 10 mg, Oral, 2 times daily   traZODone (DESYREL) 50 mg, Oral, Daily at bedtime   valACYclovir (VALTREX) 500 mg, Oral, 2 times daily     Patient Active Problem List   Diagnosis Date Noted   Chronic insomnia 10/16/2022   Acute on chronic systolic heart failure, NYHA class 4 ([place]) 08/20/2022   Acute on chronic systolic CHF (congestive heart failure) ([place]) 04/25/2022   Acute on chronic systolic (congestive) heart failure ([employer]) 04/25/2022   CHF exacerbation ([place]) 04/15/2022   Type 2 diabetes mellitus with  complication, without long-term current use of insulin ([place]) 04/04/2022   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   Dizziness 12/31/2020   Patellar tendon rupture, right, initial encounter 09/13/2020   Restrictive lung disease 06/22/2020   BRBPR (bright red blood per rectum) 66/44/0347   Chronic systolic CHF (congestive heart failure) ([place]) 05/24/2020   Acute blood loss anemia 05/24/2020   Paroxysmal atrial fibrillation ([place]) 03/28/2020   Secondary hypercoagulable state ([place]) 03/28/2020   Educated about COVID-19 virus infection 02/10/2020    S/P ICD (internal cardiac defibrillator) procedure 07/18/2019   Nontraumatic complete tear of right rotator cuff 06/29/2019   Pain in left elbow 06/29/2019   Prediabetes 06/10/2018   Thrombocytopenia ([place]) 06/10/2018   AICD (automatic cardioverter/defibrillator) present 06/10/2018   History of peptic ulcer disease 06/10/2018   NSVT (nonsustained ventricular tachycardia) (HCC)    Hypotension due to drugs 02/09/2018   Difficulty controlling anger 12/31/2016   Anxiety 08/20/2016   Moderate episode of recurrent major depressive disorder ([place]) 08/20/2016   Polyp of colon 04/29/2016   Shortness of breath 03/05/2016   Essential hypertension 10/31/2015   Acute on chronic systolic heart failure ([place]) 08/29/2015   Hx of CABG 08/29/2015   Stage 3 chronic kidney disease ([place]) 08/29/2015   OSA treated with BiPAP 06/10/2011   Fatigue 06/10/2011   Obesity (BMI 30-39.9) 05/02/2010   Hemorrhoids 02/08/2010   GERD (gastroesophageal reflux disease) 02/08/2010   Rectal bleeding 02/08/2010   Hyperlipidemia 04/06/2009   Cardiomyopathy, dilated, 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     Objective:     BP 116/76 (BP Location: Right Arm, Patient Position: Sitting, Cuff Size: Normal)   Pulse 94   Temp 98.2 F (36.8 C) (Oral)   Ht 6' (1.829 m)   Wt 192 lb 14.4 oz (87.5 kg)   SpO2 100%   BMI 26.16 kg/m    Physical Exam Vitals reviewed.  Constitutional:      Appearance: Normal appearance. He is well-groomed and normal weight.  Eyes:     Extraocular Movements: Extraocular movements intact.     Conjunctiva/sclera: Conjunctivae normal.  Cardiovascular:     Rate and Rhythm: Normal rate and regular rhythm.     Pulses:          Dorsalis pedis pulses are 2+ on the right side and 2+ on the left side.     Heart sounds: S1 normal and S2 normal. No murmur heard. Pulmonary:     Effort:  Pulmonary effort is normal.     Breath sounds: Normal breath sounds and air entry. No rales.  Chest:     Chest wall: No tenderness.  Abdominal:     General: Bowel sounds are normal.  Musculoskeletal:     Right lower leg: Edema (1+ ankle edema BL) present.     Left lower leg: Edema present.  Feet:     Right foot:     Protective Sensation: 4 sites tested.  4 sites sensed.     Skin integrity: Skin integrity normal.     Toenail Condition: Right toenails are abnormally thick.     Left foot:     Protective Sensation: 4 sites tested.  4 sites sensed.     Skin integrity: Skin integrity normal.     Toenail Condition: Left toenails are abnormally thick.  Neurological:     General: No focal deficit present.     Mental Status: He  is alert and oriented to person, place, and time.     Gait: Gait is intact.  Psychiatric:        Mood and Affect: Mood and affect normal.      No results found for any visits on 10/16/22.    The ASCVD Risk score (Arnett DK, et al., 2019) failed to calculate for the following reasons:   The valid total cholesterol range is 130 to 320 mg/dL    Assessment & Plan:   Problem List Items Addressed This Visit       Unprioritized   Type 2 diabetes mellitus with complication, without long-term current use of insulin ([place]) - Primary    Patient is on novolog 4 unit TID with meals, last A1C was done in October in the hospital and it was 5.5. he reports some confusion about when to take an injection. We discussed switching him to a CGM and he is agreeable. I gave him 1 month of freestyle libre 3 sensors and I will place order for CGM's. He is already being followed by an endocrinologist since the heart transplant in October. Will continue to follow with them. Foot exam performed today and is normal      Relevant Medications   insulin lispro (HUMALOG) 100 UNIT/ML injection   pravastatin (PRAVACHOL) 40 MG tablet   Chronic insomnia (Chronic)    Has had issues before the  hospital stay, he reports that it was previously due to his SOB/ dilated CM. Now after his heart transplant it has worsened significantly, he states that the transplant physician told his it was due to the prednisone, he is hopefully going to be slowly weaned from the prednisone, down to 5 mg daily (is currently on 15 mg daily). We had a long conversation about sleep medications. He has failed trazodone and melatonin, failed OTC medications such as tylenol PM. I advised that we can try belsomra 10 mg at bedtime. We discussed that it is a schedule 4 controlled and can have the risk of dependency, over dose and withdrawal with scheduled medications. Rx sent to pharmacy. He will let me know if this medication works for him.      Relevant Medications   Suvorexant (BELSOMRA) 10 MG TABS   I spent 35 minutes with the patient today extensively reviewing his discharge summaries from October and December, reviewing medication, labs from his hospital stay, discussing CGM's and also discussing his insomnia.  Return in about 6 months (around 04/17/2023).    Farrel Conners, MD

## 2022-10-16 NOTE — Assessment & Plan Note (Signed)
Has had issues before the hospital stay, he reports that it was previously due to his SOB/ dilated CM. Now after his heart transplant it has worsened significantly, he states that the transplant physician told his it was due to the prednisone, he is hopefully going to be slowly weaned from the prednisone, down to 5 mg daily (is currently on 15 mg daily). We had a long conversation about sleep medications. He has failed trazodone and melatonin, failed OTC medications such as tylenol PM. I advised that we can try belsomra 10 mg at bedtime. We discussed that it is a schedule 4 controlled and can have the risk of dependency, over dose and withdrawal with scheduled medications. Rx sent to pharmacy. He will let me know if this medication works for him.

## 2022-10-21 DIAGNOSIS — E118 Type 2 diabetes mellitus with unspecified complications: Secondary | ICD-10-CM | POA: Diagnosis not present

## 2022-10-22 ENCOUNTER — Encounter: Payer: Self-pay | Admitting: Family Medicine

## 2022-10-22 DIAGNOSIS — D849 Immunodeficiency, unspecified: Secondary | ICD-10-CM | POA: Diagnosis not present

## 2022-10-22 DIAGNOSIS — Z48298 Encounter for aftercare following other organ transplant: Secondary | ICD-10-CM | POA: Diagnosis not present

## 2022-10-22 DIAGNOSIS — Z941 Heart transplant status: Secondary | ICD-10-CM | POA: Diagnosis not present

## 2022-10-22 DIAGNOSIS — F5104 Psychophysiologic insomnia: Secondary | ICD-10-CM

## 2022-10-22 DIAGNOSIS — Z79899 Other long term (current) drug therapy: Secondary | ICD-10-CM | POA: Diagnosis not present

## 2022-10-22 DIAGNOSIS — Z2989 Encounter for other specified prophylactic measures: Secondary | ICD-10-CM | POA: Diagnosis not present

## 2022-10-23 MED ORDER — DOXEPIN HCL 3 MG PO TABS: 1.0000 | ORAL_TABLET | Freq: Every day | ORAL | 2 refills | Status: DC

## 2022-10-29 DIAGNOSIS — I081 Rheumatic disorders of both mitral and tricuspid valves: Secondary | ICD-10-CM | POA: Diagnosis not present

## 2022-10-29 DIAGNOSIS — Z79621 Long term (current) use of calcineurin inhibitor: Secondary | ICD-10-CM | POA: Diagnosis not present

## 2022-10-29 DIAGNOSIS — Z79624 Long term (current) use of inhibitors of nucleotide synthesis: Secondary | ICD-10-CM | POA: Diagnosis not present

## 2022-10-29 DIAGNOSIS — Z79899 Other long term (current) drug therapy: Secondary | ICD-10-CM | POA: Diagnosis not present

## 2022-10-29 DIAGNOSIS — Z48298 Encounter for aftercare following other organ transplant: Secondary | ICD-10-CM | POA: Diagnosis not present

## 2022-10-29 DIAGNOSIS — D84821 Immunodeficiency due to drugs: Secondary | ICD-10-CM | POA: Diagnosis not present

## 2022-10-29 DIAGNOSIS — Z7952 Long term (current) use of systemic steroids: Secondary | ICD-10-CM | POA: Diagnosis not present

## 2022-10-29 DIAGNOSIS — Z941 Heart transplant status: Secondary | ICD-10-CM | POA: Diagnosis not present

## 2022-10-29 DIAGNOSIS — D849 Immunodeficiency, unspecified: Secondary | ICD-10-CM | POA: Diagnosis not present

## 2022-10-29 DIAGNOSIS — I10 Essential (primary) hypertension: Secondary | ICD-10-CM | POA: Diagnosis not present

## 2022-10-29 DIAGNOSIS — Z2989 Encounter for other specified prophylactic measures: Secondary | ICD-10-CM | POA: Diagnosis not present

## 2022-10-29 DIAGNOSIS — G4700 Insomnia, unspecified: Secondary | ICD-10-CM | POA: Diagnosis not present

## 2022-10-29 MED ORDER — DOXEPIN HCL 10 MG PO CAPS: 10.0000 mg | ORAL_CAPSULE | Freq: Every day | ORAL | 0 refills | Status: DC

## 2022-11-09 DIAGNOSIS — G4731 Primary central sleep apnea: Secondary | ICD-10-CM | POA: Diagnosis not present

## 2022-11-15 ENCOUNTER — Other Ambulatory Visit: Payer: Self-pay | Admitting: Family Medicine

## 2022-11-15 ENCOUNTER — Telehealth: Payer: Self-pay | Admitting: *Deleted

## 2022-11-15 DIAGNOSIS — N528 Other male erectile dysfunction: Secondary | ICD-10-CM

## 2022-11-15 MED ORDER — SILDENAFIL CITRATE 50 MG PO TABS: 50.0000 mg | ORAL_TABLET | Freq: Every day | ORAL | 2 refills | Status: DC | PRN

## 2022-11-15 NOTE — Telephone Encounter (Signed)
Rx sent 

## 2022-11-15 NOTE — Telephone Encounter (Signed)
[facility] faxed a refill request for Sildenafil '50mg'$ -take 1 tablet by mouth daily as needed for erectile dysfunction-#10.  Message sent to PCP as Rx is not on the current medication list.

## 2022-11-19 DIAGNOSIS — Z941 Heart transplant status: Secondary | ICD-10-CM | POA: Diagnosis not present

## 2022-11-20 DIAGNOSIS — E119 Type 2 diabetes mellitus without complications: Secondary | ICD-10-CM | POA: Diagnosis not present

## 2022-11-26 ENCOUNTER — Ambulatory Visit: Payer: Medicare HMO

## 2022-11-27 DIAGNOSIS — Z941 Heart transplant status: Secondary | ICD-10-CM | POA: Diagnosis not present

## 2022-11-27 DIAGNOSIS — R0981 Nasal congestion: Secondary | ICD-10-CM | POA: Diagnosis not present

## 2022-11-27 DIAGNOSIS — Z79899 Other long term (current) drug therapy: Secondary | ICD-10-CM | POA: Diagnosis not present

## 2022-11-27 DIAGNOSIS — G4700 Insomnia, unspecified: Secondary | ICD-10-CM | POA: Diagnosis not present

## 2022-11-27 DIAGNOSIS — R059 Cough, unspecified: Secondary | ICD-10-CM | POA: Diagnosis not present

## 2022-11-27 DIAGNOSIS — Z2989 Encounter for other specified prophylactic measures: Secondary | ICD-10-CM | POA: Diagnosis not present

## 2022-11-27 DIAGNOSIS — R0789 Other chest pain: Secondary | ICD-10-CM | POA: Diagnosis not present

## 2022-11-27 DIAGNOSIS — I10 Essential (primary) hypertension: Secondary | ICD-10-CM | POA: Diagnosis not present

## 2022-11-27 DIAGNOSIS — Z48298 Encounter for aftercare following other organ transplant: Secondary | ICD-10-CM | POA: Diagnosis not present

## 2022-11-27 DIAGNOSIS — Z4821 Encounter for aftercare following heart transplant: Secondary | ICD-10-CM | POA: Diagnosis not present

## 2022-12-02 ENCOUNTER — Other Ambulatory Visit (HOSPITAL_COMMUNITY): Payer: Self-pay | Admitting: Adult Health

## 2022-12-04 ENCOUNTER — Ambulatory Visit: Payer: Medicare HMO

## 2022-12-09 NOTE — Progress Notes (Signed)
Care Management & Coordination Services Pharmacy Note  12/09/2022 Name:  Bruce Robertson MRN:  [id number] DOB:  Sep 05, 1961  Summary: -No health concerns from pt today -Needs updated lipid panel at next OV (2/16) -Reports home SBP readings in the low 130s the past week, unable to provide specific readings  Recommendations/Changes made from today's visit: -Recommend order for updated lipid panel at next OV (2/16) -Continue medication therapy -Continue routine home BP/HR monitoring and keep a log  Follow up plan: HTN review call in 2 and 4 months Pharmacist visit in 6 months   Subjective: Bruce Robertson is an 62 y.o. year old male who is a primary patient of Loralyn Freshwater, MD.  The care coordination team was consulted for assistance with disease management and care coordination needs.    Engaged with patient by telephone for follow up visit.  Recent office visits: 10/22/22 Loralyn Freshwater, MD: Doxepin 81m at bedtime started (telephone message) 10/16/22 BLoralyn Freshwater MD: For hospital f/u: START Belsomra 154mat bedtime. STOP Atorvastatin 407mnd Breztri (per pt preference). Pt reported not taking Jardiance, Spironolactone, Torsemide. Sample of freestyle libre 3 provided in office  Recent consult visits: 11[facility]/22 [facility] - For routine f/u. START amlodipine 5mg79mily. DECREASE prednisone to 10mg99mly. 10/29/22 [facility] For routine f/u. No medication changes. Echo performed, EF >55% 10/16/22 JuliuLouie Casa - Per chart, phone visit. No further information listed at this time. 10/15/22 [facility]annie [facility] - For routine f/u. No medication changes.  [facility] STOP Aspirin, Prednisone. Discharge 10/06/22  08/24/22 -  For heart transplant Objective:  Lab Results  Component Value Date   CREATININE 1.68 (H) 08/23/2022   BUN 26 (H) 08/23/2022    GFR 47.04 (L) 01/03/2022   EGFR 47 (L) 10/05/2021   GFRNONAA 47 (L) 08/23/2022   GFRAA 42 (L) 07/24/2020   NA 127 (L) 08/23/2022   K 4.7 08/23/2022   CALCIUM 9.7 08/23/2022   CO2 26 08/23/2022   GLUCOSE 142 (H) 08/23/2022    Lab Results  Component Value Date/Time   HGBA1C 7.0 (H) 04/15/2022 02:35 PM   HGBA1C 7.0 (H) 11/30/2021 01:01 PM   GFR 47.04 (L) 01/03/2022 08:22 AM   GFR 41.42 (L) 11/30/2021 01:01 PM   MICROALBUR <0.7 11/30/2021 01:01 PM   MICROALBUR <0.7 12/01/2020 10:33 AM    Last diabetic Eye exam: No results found for: "HMDIABEYEEXA"  Last diabetic Foot exam: No results found for: "HMDIABFOOTEX"   Lab Results  Component Value Date   CHOL 141 11/30/2021   HDL 48.90 11/30/2021   LDLCALC 76 11/30/2021   LDLDIRECT 166.4 05/17/2010   TRIG 82.0 11/30/2021   CHOLHDL 3 11/30/2021       Latest Ref Rng & Units 08/19/2022   10:58 AM 04/24/2022    4:01 PM 01/03/2022    8:22 AM  Hepatic Function  Total Protein 6.5 - 8.1 g/dL 7.6  7.2  6.8   Albumin 3.5 - 5.0 g/dL 3.9  4.0  4.0   AST 15 - 41 U/L 25  23  23   $ ALT 0 - 44 U/L 28  22  20   $ Alk Phosphatase 38 - 126 U/L 75  72  57   Total Bilirubin 0.3 - 1.2 mg/dL 1.9  1.2  0.7     Lab Results  Component Value Date/Time   TSH 5.424 (H) 08/21/2022 04:16 PM   TSH  1.873 04/29/2022 08:57 AM   TSH 1.59 11/30/2021 01:01 PM   TSH 1.31 05/30/2021 08:48 AM   FREET4 1.29 11/30/2021 01:01 PM   FREET4 0.88 03/05/2021 08:29 AM       Latest Ref Rng & Units 08/23/2022    4:27 AM 08/23/2022    4:13 AM 08/22/2022    1:56 PM  CBC  WBC 4.0 - 10.5 K/uL  12.2    Hemoglobin 13.0 - 17.0 g/dL 11.9  10.2  11.2   Hematocrit 39.0 - 52.0 % 35.0  31.7  33.0   Platelets 150 - 400 K/uL  180      Lab Results  Component Value Date/Time   VD25OH 32.91 12/01/2020 10:33 AM   VITAMINB12 >1526 (H) 12/01/2020 10:33 AM   [id number] 11/25/2018 04:40 PM    Clinical ASCVD: No  The ASCVD Risk score (Arnett DK, et al., 2019) failed to  calculate for the following reasons:   The valid total cholesterol range is 130 to 320 mg/dL  CHADS2VASc: 3     10/16/2022   10:29 AM 06/06/2022   11:56 AM 11/30/2021   11:27 AM  Depression screen PHQ 2/9  Decreased Interest 0 0 0  Down, Depressed, Hopeless 0 0 1  PHQ - 2 Score 0 0 1  Altered sleeping 1  2  Tired, decreased energy   3  Change in appetite 0  0  Feeling bad or failure about yourself  0  0  Trouble concentrating 1  0  Moving slowly or fidgety/restless 0  0  Suicidal thoughts 0  0  PHQ-9 Score 2  6  Difficult doing work/chores Not difficult at all       Social History   Tobacco Use  Smoking Status Former   Packs/day: 0.25   Years: 7.00   Total pack years: 1.75   Types: Cigarettes  Smokeless Tobacco Never  Tobacco Comments   03/20/2017 "quit in 1997   BP Readings from Last 3 Encounters:  10/16/22 116/76  08/23/22 (!) 110/95  08/19/22 104/60   Pulse Readings from Last 3 Encounters:  10/16/22 94  08/23/22 (!) 59  08/19/22 60   Wt Readings from Last 3 Encounters:  10/16/22 192 lb 14.4 oz (87.5 kg)  08/23/22 176 lb 5.9 oz (80 kg)  08/19/22 182 lb (82.6 kg)   BMI Readings from Last 3 Encounters:  10/16/22 26.16 kg/m  08/23/22 23.92 kg/m  08/19/22 24.68 kg/m    No Known Allergies  Medications Reviewed Today     Reviewed by Farrel Conners, MD (Physician) on 10/16/22 at [facility] List Status: <None>   Medication Order Taking? Sig Documenting Provider Last Dose Status Informant  ACCU-CHEK GUIDE test strip [id number] Yes Use as directed-patient needs an appt Farrel Conners, MD Taking Active Self  Accu-Chek Softclix Lancets lancets [id number] Yes Use as directed Farrel Conners, MD Taking Active Self  albuterol (VENTOLIN HFA) 108 (90 Base) MCG/ACT inhaler [id number] Yes Inhale 2 puffs into the lungs every 6 (six) hours as needed for wheezing or shortness of breath. Farrel Conners, MD Taking Active Self    Discontinued 10/16/22 [phone]  (Patient Preference)   Discontinued 10/16/22 [phone] (Patient Preference) blood glucose meter kit and supplies KIT [id number] Yes Dispense based on patient and insurance preference. Use up to four times daily as directed. Caren Macadam, MD Taking Active Self   Patient not taking:   Discontinued 10/16/22 0927 (Patient Preference) digoxin (LANOXIN) 0.125 MG tablet [id number]  Yes Take 1 tablet (0.125 mg total) by mouth daily. Darrick Grinder D, NP Taking Active Self  insulin lispro (HUMALOG) 100 UNIT/ML injection [id number] Yes Inject 4 Units into the skin 3 (three) times daily before meals. [provider] Taking Active    Patient not taking:   Discontinued 10/16/22 0927 (Patient Preference) mycophenolate (MYFORTIC) 360 MG TBEC EC tablet [id number] Yes Take 720 mg by mouth 2 (two) times daily. [provider] Taking Active   NYSTATIN PO [id number] Yes Take 5 mLs by mouth in the morning, at noon, in the evening, and at bedtime. [provider] Taking Active   omeprazole (PRILOSEC) 40 MG capsule [id number] Yes Take 1 capsule (40 mg total) by mouth daily. Darrick Grinder D, NP Taking Active Self  ondansetron (ZOFRAN-ODT) 4 MG disintegrating tablet [id number] Yes Take 4 mg by mouth every 8 (eight) hours as needed. [provider] Taking Active   pantoprazole (PROTONIX) 40 MG tablet [id number] Yes Take 40 mg by mouth daily. [provider] Taking Active   pravastatin (PRAVACHOL) 40 MG tablet [id number] Yes Take 40 mg by mouth at bedtime. [provider] Taking Active   predniSONE (DELTASONE) 5 MG tablet [id number] Yes Take 5 mg by mouth daily with breakfast. [provider] Taking Active     Discontinued 10/16/22 0927 (Patient Preference)  Patient not taking:   Discontinued 10/16/22 0928 (Patient Preference) sulfamethoxazole-trimethoprim (BACTRIM DS) 800-160 MG tablet [id number] Yes Take 1 tablet by mouth once. [provider] Taking Active   tacrolimus  (PROGRAF) 1 MG capsule [id number] Yes Take 10 mg by mouth 2 (two) times daily. [provider] Taking Active    Patient not taking:   Discontinued 10/16/22 0928 (Patient Preference) traZODone (DESYREL) 50 MG tablet [id number] Yes Take 50 mg by mouth at bedtime. [provider] Taking Active   valACYclovir (VALTREX) 500 MG tablet [id number] Yes Take 500 mg by mouth 2 (two) times daily. [provider] Taking Active             SDOH:  (Social Determinants of Health) assessments and interventions performed: Yes SDOH Interventions    Flowsheet Row Patient Outreach Telephone from 06/06/2022 in [place] ED to Hosp-Admission (Discharged) from 04/15/2022 in [facility] PCU Chronic Care Management from 01/29/2022 in [place] at [facility] Interventions -- Intervention Not Indicated --  Housing Interventions -- Intervention Not Indicated --  Transportation Interventions Intervention Not Indicated Intervention Not Indicated Intervention Not Indicated  Financial Strain Interventions -- -- Other (Comment)  [submitted PAP for Breztri]       Medication Assistance: None required.  Patient affirms current coverage meets needs.  Medication Access: Within the past 30 days, how often has patient missed a dose of medication? None  Is a pillbox or other method used to improve adherence? Yes  Factors that may affect medication adherence? no barriers identified Are meds synced by current pharmacy? No  Are meds delivered by current pharmacy? No  Does patient experience delays in picking up medications due to transportation concerns? No   [facility] Reviewed: Is patient disadvantaged to use [facility]?: Yes  Current Rx insurance plan: [employer] Dual Complete w/ familly planning [place] Name and location of Current pharmacy:  [facility] [address]  ([address] [place] [id number] [place] ([place])  [id number] Phone: [phone] Fax: [phone]  [facility] services reviewed  with patient today?: No  Patient requests to transfer care to [facility]?: No  Reason patient declined to change pharmacies: Disadvantaged due to insurance/mail order  Compliance/Adherence/Medication fill history: Care Gaps: Diabetic foot exam/eye exam Tdap/COVID Urine microalbumin  AWV (scheduled 12/20/22)  Star-Rating Drugs: Pravastatin 29m  PDC 100%   Assessment/Plan   Hypertension (BP goal <130/80) -Controlled -Current treatment: Amlodipine 545mone tab daily (START 11/28/22) -Medications previously tried: Carvedilol, Spironolactone, Torsemide, Entresto, Lisinopril, Losartan, Metoprolol -Current home readings: unable to recall specific numbers but reports readings in the low 130s since just starting amlodipine -Current dietary habits: tries not to add salt to food -Current exercise habits: resumed going back to the gym about every other day this week -Denies hypotensive/hypertensive symptoms -Educated on BP goals and benefits of medications for prevention of heart attack, stroke and kidney damage; Daily salt intake goal < 2300 mg; Exercise goal of 150 minutes per week; Importance of home blood pressure monitoring; Proper BP monitoring technique; -Counseled to monitor BP at home every other day, document, and provide log at future appointments -Counseled on diet and exercise extensively Recommended to continue current medication  Hyperlipidemia: (LDL goal < 70) -Controlled -Current treatment: Pravastatin 4034maily at bedtime Appropriate, Effective, Safe, Accessible -Medications previously tried: Atorvastatin, Simvastatin  -Current dietary patterns: not discussed -Current exercise habits: see above -Educated on Cholesterol goals;  Benefits of statin for ASCVD risk reduction; Exercise goal of 150 minutes per  week; Discussed with patient that lipid panel is due at next OV -Recommended to continue current medication Recommended updated lipid panel at next OV  Insomnia (Goal: Appropriate amount of sleep for pt to feel well-rested) -Controlled -Current treatment  Belsomra 28m76m bedtime Doxepin 28mg39mbedtime -Medications previously tried: trazodone  -Recommended to continue current medication  AngelSterlingmacist [phone]

## 2022-12-10 DIAGNOSIS — D849 Immunodeficiency, unspecified: Secondary | ICD-10-CM | POA: Diagnosis not present

## 2022-12-10 DIAGNOSIS — Z2989 Encounter for other specified prophylactic measures: Secondary | ICD-10-CM | POA: Diagnosis not present

## 2022-12-10 DIAGNOSIS — Z941 Heart transplant status: Secondary | ICD-10-CM | POA: Diagnosis not present

## 2022-12-10 DIAGNOSIS — Z79899 Other long term (current) drug therapy: Secondary | ICD-10-CM | POA: Diagnosis not present

## 2022-12-10 DIAGNOSIS — Z48298 Encounter for aftercare following other organ transplant: Secondary | ICD-10-CM | POA: Diagnosis not present

## 2022-12-10 DIAGNOSIS — G4731 Primary central sleep apnea: Secondary | ICD-10-CM | POA: Diagnosis not present

## 2022-12-12 ENCOUNTER — Telehealth: Payer: Self-pay

## 2022-12-12 NOTE — Progress Notes (Signed)
Care Management & Coordination Services Pharmacy Team  Reason for Encounter: Appointment Reminder  Contacted patient to confirm telephone appointment with Burman Riis, PharmD on 12/13/2022 at 8:30. Spoke with patient on 12/12/2022   Do you have any problems getting your medications? Patient denies If yes what types of problems are you experiencing?   What is your top health concern you would like to discuss at your upcoming visit? Patient denies  Have you seen any other providers since your last visit with PCP? [facility]  Star Rating Drugs:  Pravastatin 40 mg - last filled 11/19/2022 30 DS at [facility]: AWV - scheduled 12/20/2022 Last eye exam / retinopathy screening: never done Last diabetic foot exam: never done Tdap - never done Covid - overdue Urine ACR - overdue Shingrix - postponed  [place] Pharmacist Assistant [phone]

## 2022-12-13 ENCOUNTER — Ambulatory Visit: Payer: 59

## 2022-12-20 ENCOUNTER — Ambulatory Visit (INDEPENDENT_AMBULATORY_CARE_PROVIDER_SITE_OTHER): Payer: 59

## 2022-12-20 VITALS — BP 120/62 | HR 90 | Temp 98.4°F | Ht 73.83 in | Wt 220.1 lb

## 2022-12-20 DIAGNOSIS — Z0000 Encounter for general adult medical examination without abnormal findings: Secondary | ICD-10-CM | POA: Diagnosis not present

## 2022-12-20 NOTE — Patient Instructions (Addendum)
[name] , Thank you for taking time to come for your Medicare Wellness Visit. I appreciate your ongoing commitment to your health goals. Please review the following plan we discussed and let me know if I can assist you in the future.   These are the goals we discussed:  Goals       Monitor and Manage My Blood Sugar-Diabetes Type 2      Timeframe:  Long-Range Goal Priority:  High Start Date:                             Expected End Date:                       Follow Up Date 05/01/22    - check blood sugar at prescribed times - check blood sugar if I feel it is too high or too low - take the blood sugar log to all doctor visits    Why is this important?   Checking your blood sugar at home helps to keep it from getting very high or very low.  Writing the results in a diary or log helps the doctor know how to care for you.  Your blood sugar log should have the time, date and the results.  Also, write down the amount of insulin or other medicine that you take.  Other information, like what you ate, exercise done and how you were feeling, will also be helpful.     Notes:       Patient Stated      Stay healthy as possible      Patient Stated      11/20/2021, no goals      Request to take extra Torsemide for fluid retention (pt-stated)      Care Coordination Interventions: Advised patient to his Primary provider prior to make any changes with his medications Provided education to patient re: regarding HF zones and when to contact  his provider related to his symptoms to avoid entering the hospital Reviewed medications with patient and discussed purpose of all medications Collaborated with Jeni Salles (PCP pharmacy) regarding Pt request regarding the medications Torsemide dosing. Reviewed scheduled/upcoming provider appointments including all pending appointments discussed and verified sufficient transportation Advised patient to discuss Medication issues with his Torsemide with  Dr. Haroldine Laws on his upcoming appointment with provider Screening for signs and symptoms of depression related to chronic disease state  Assessed social determinant of health barriers PT DECLINE ONGOING OFFER FOR FOLLOW UP OR ENTRY INTO A DISEASE MANAGEMENT PROGRAM FOR HF.      Stay Healthy (pt-stated)      Get back in shape.        This is a list of the screening recommended for you and due dates:  Health Maintenance  Topic Date Due   DTaP/Tdap/Td vaccine (1 - Tdap) Never done   Eye exam for diabetics  12/20/2022*   Yearly kidney health urinalysis for diabetes  12/21/2022*   Complete foot exam   12/21/2022*   COVID-19 Vaccine (5 - 2023-24 season) 01/05/2023*   Zoster (Shingles) Vaccine (1 of 2) 01/15/2023*   Hemoglobin A1C  02/23/2023   Yearly kidney function blood test for diabetes  08/24/2023   Medicare Annual Wellness Visit  12/21/2023   Colon Cancer Screening  01/20/2029   Flu Shot  Completed   Hepatitis C Screening: USPSTF Recommendation to screen - Ages 18-79 yo.  Completed  HIV Screening  Completed   HPV Vaccine  Aged Out  *Topic was postponed. The date shown is not the original due date.    Advanced directives: Advance directive discussed with you today. Even though you declined this today, please call our office should you change your mind, and we can give you the proper paperwork for you to fill out.   Conditions/risks identified: None  Next appointment: Follow up in one year for your annual wellness visit.    Preventive Care 40-64 Years, Male Preventive care refers to lifestyle choices and visits with your health care provider that can promote health and wellness. What does preventive care include? A yearly physical exam. This is also called an annual well check. Dental exams once or twice a year. Routine eye exams. Ask your health care provider how often you should have your eyes checked. Personal lifestyle choices, including: Daily care of your teeth and  gums. Regular physical activity. Eating a healthy diet. Avoiding tobacco and drug use. Limiting alcohol use. Practicing safe sex. Taking low-dose aspirin daily starting at age 48. Taking vitamin and mineral supplements as recommended by your health care provider. What happens during an annual well check? The services and screenings done by your health care provider during your annual well check will depend on your age, overall health, lifestyle risk factors, and family history of disease. Counseling  Your health care provider may ask you questions about your: Alcohol use. Tobacco use. Drug use. Emotional well-being. Home and relationship well-being. Sexual activity. Eating habits. Work and work Statistician. Method of birth control. Menstrual cycle. Pregnancy history. Screening  You may have the following tests or measurements: Height, weight, and BMI. Blood pressure. Lipid and cholesterol levels. These may be checked every 5 years, or more frequently if you are over 67 years old. Skin check. Lung cancer screening. You may have this screening every year starting at age 68 if you have a 30-pack-year history of smoking and currently smoke or have quit within the past 15 years. Fecal occult blood test (FOBT) of the stool. You may have this test every year starting at age 67. Flexible sigmoidoscopy or colonoscopy. You may have a sigmoidoscopy every 5 years or a colonoscopy every 10 years starting at age 28. Hepatitis C blood test. Hepatitis B blood test. Sexually transmitted disease (STD) testing. Diabetes screening. This is done by checking your blood sugar (glucose) after you have not eaten for a while (fasting). You may have this done every 1-3 years. Mammogram. This may be done every 1-2 years. Talk to your health care provider about when you should start having regular mammograms. This may depend on whether you have a family history of breast cancer. BRCA-related cancer  screening. This may be done if you have a family history of breast, ovarian, tubal, or peritoneal cancers. Pelvic exam and Pap test. This may be done every 3 years starting at age 53. Starting at age 76, this may be done every 5 years if you have a Pap test in combination with an HPV test. Bone density scan. This is done to screen for osteoporosis. You may have this scan if you are at high risk for osteoporosis. Discuss your test results, treatment options, and if necessary, the need for more tests with your health care provider. Vaccines  Your health care provider may recommend certain vaccines, such as: Influenza vaccine. This is recommended every year. Tetanus, diphtheria, and acellular pertussis (Tdap, Td) vaccine. You may need a Td booster every  10 years. Zoster vaccine. You may need this after age 79. Pneumococcal 13-valent conjugate (PCV13) vaccine. You may need this if you have certain conditions and were not previously vaccinated. Pneumococcal polysaccharide (PPSV23) vaccine. You may need one or two doses if you smoke cigarettes or if you have certain conditions. Talk to your health care provider about which screenings and vaccines you need and how often you need them. This information is not intended to replace advice given to you by your health care provider. Make sure you discuss any questions you have with your health care provider. Document Released: 11/17/2015 Document Revised: 07/10/2016 Document Reviewed: 08/22/2015 Elsevier Interactive Patient Education  2017 [employer] Prevention in the Home Falls can cause injuries. They can happen to people of all ages. There are many things you can do to make your home safe and to help prevent falls. What can I do on the outside of my home? Regularly fix the edges of walkways and driveways and fix any cracks. Remove anything that might make you trip as you walk through a door, such as a raised step or threshold. Trim any  bushes or trees on the path to your home. Use bright outdoor lighting. Clear any walking paths of anything that might make someone trip, such as rocks or tools. Regularly check to see if handrails are loose or broken. Make sure that both sides of any steps have handrails. Any raised decks and porches should have guardrails on the edges. Have any leaves, snow, or ice cleared regularly. Use sand or salt on walking paths during winter. Clean up any spills in your garage right away. This includes oil or grease spills. What can I do in the bathroom? Use night lights. Install grab bars by the toilet and in the tub and shower. Do not use towel bars as grab bars. Use non-skid mats or decals in the tub or shower. If you need to sit down in the shower, use a plastic, non-slip stool. Keep the floor dry. Clean up any water that spills on the floor as soon as it happens. Remove soap buildup in the tub or shower regularly. Attach bath mats securely with double-sided non-slip rug tape. Do not have throw rugs and other things on the floor that can make you trip. What can I do in the bedroom? Use night lights. Make sure that you have a light by your bed that is easy to reach. Do not use any sheets or blankets that are too big for your bed. They should not hang down onto the floor. Have a firm chair that has side arms. You can use this for support while you get dressed. Do not have throw rugs and other things on the floor that can make you trip. What can I do in the kitchen? Clean up any spills right away. Avoid walking on wet floors. Keep items that you use a lot in easy-to-reach places. If you need to reach something above you, use a strong step stool that has a grab bar. Keep electrical cords out of the way. Do not use floor polish or wax that makes floors slippery. If you must use wax, use non-skid floor wax. Do not have throw rugs and other things on the floor that can make you trip. What can I do  with my stairs? Do not leave any items on the stairs. Make sure that there are handrails on both sides of the stairs and use them. Fix handrails that  are broken or loose. Make sure that handrails are as long as the stairways. Check any carpeting to make sure that it is firmly attached to the stairs. Fix any carpet that is loose or worn. Avoid having throw rugs at the top or bottom of the stairs. If you do have throw rugs, attach them to the floor with carpet tape. Make sure that you have a light switch at the top of the stairs and the bottom of the stairs. If you do not have them, ask someone to add them for you. What else can I do to help prevent falls? Wear shoes that: Do not have high heels. Have rubber bottoms. Are comfortable and fit you well. Are closed at the toe. Do not wear sandals. If you use a stepladder: Make sure that it is fully opened. Do not climb a closed stepladder. Make sure that both sides of the stepladder are locked into place. Ask someone to hold it for you, if possible. Clearly mark and make sure that you can see: Any grab bars or handrails. First and last steps. Where the edge of each step is. Use tools that help you move around (mobility aids) if they are needed. These include: Canes. Walkers. Scooters. Crutches. Turn on the lights when you go into a dark area. Replace any light bulbs as soon as they burn out. Set up your furniture so you have a clear path. Avoid moving your furniture around. If any of your floors are uneven, fix them. If there are any pets around you, be aware of where they are. Review your medicines with your doctor. Some medicines can make you feel dizzy. This can increase your chance of falling. Ask your doctor what other things that you can do to help prevent falls. This information is not intended to replace advice given to you by your health care provider. Make sure you discuss any questions you have with your health care  provider. Document Released: 08/17/2009 Document Revised: 03/28/2016 Document Reviewed: 11/25/2014 Elsevier Interactive Patient Education  2017 [employer].

## 2022-12-20 NOTE — Progress Notes (Signed)
Subjective:   Bruce Robertson is a 62 y.o. male who presents for Medicare Annual/Subsequent preventive examination.  Review of Systems      Cardiac Risk Factors include: advanced age (>22mn, >>85women);male gender;diabetes mellitus;hypertension     Objective:    Today's Vitals   12/20/22 1003  BP: 120/62  Pulse: 90  Temp: 98.4 F (36.9 C)  TempSrc: Oral  SpO2: 96%  Weight: 220 lb 1.6 oz (99.8 kg)  Height: 6' 1.83" (1.875 m)   Body mass index is 28.39 kg/m.     12/20/2022   10:21 AM 08/20/2022    9:52 AM 04/25/2022    8:55 AM 04/15/2022    8:20 PM 04/15/2022    9:18 AM 02/20/2022   11:04 PM 11/20/2021    9:29 AM  Advanced Directives  Does Patient Have a Medical Advance Directive? No No No No No No No  Would patient like information on creating a medical advance directive? No - Patient declined No - Patient declined No - Patient declined No - Patient declined No - Patient declined No - Patient declined No - Patient declined    Current Medications (verified) Outpatient Encounter Medications as of 12/20/2022  Medication Sig   ACCU-CHEK GUIDE test strip Use as directed-patient needs an appt   Accu-Chek Softclix Lancets lancets Use as directed   albuterol (VENTOLIN HFA) 108 (90 Base) MCG/ACT inhaler Inhale 2 puffs into the lungs every 6 (six) hours as needed for wheezing or shortness of breath.   amLODipine (NORVASC) 5 MG tablet Take 5 mg by mouth daily.   blood glucose meter kit and supplies KIT Dispense based on patient and insurance preference. Use up to four times daily as directed.   digoxin (LANOXIN) 0.125 MG tablet Take 1 tablet (0.125 mg total) by mouth daily.   doxepin (SINEQUAN) 10 MG capsule Take 1 capsule (10 mg total) by mouth at bedtime.   insulin lispro (HUMALOG) 100 UNIT/ML injection Inject 4 Units into the skin 3 (three) times daily before meals.   mycophenolate (MYFORTIC) 360 MG TBEC EC tablet Take 720 mg by mouth 2 (two) times daily.   NYSTATIN PO Take 5  mLs by mouth in the morning, at noon, in the evening, and at bedtime.   omeprazole (PRILOSEC) 40 MG capsule Take 1 capsule (40 mg total) by mouth daily.   pantoprazole (PROTONIX) 40 MG tablet Take 40 mg by mouth daily.   pravastatin (PRAVACHOL) 40 MG tablet Take 40 mg by mouth at bedtime.   predniSONE (DELTASONE) 5 MG tablet Take 5 mg by mouth daily with breakfast.   sildenafil (VIAGRA) 50 MG tablet Take 1 tablet (50 mg total) by mouth daily as needed for erectile dysfunction.   sulfamethoxazole-trimethoprim (BACTRIM DS) 800-160 MG tablet Take 1 tablet by mouth once.   Suvorexant (BELSOMRA) 10 MG TABS Take 10 mg by mouth at bedtime.   tacrolimus (PROGRAF) 1 MG capsule Take 10 mg by mouth 2 (two) times daily.   valACYclovir (VALTREX) 500 MG tablet Take 500 mg by mouth 2 (two) times daily.   No facility-administered encounter medications on file as of 12/20/2022.    Allergies (verified) Patient has no known allergies.   History: Past Medical History:  Diagnosis Date   AICD (automatic cardioverter/defibrillator) present    Anxiety 08/20/2016   Barrett's esophagus 2002   Cardiomyopathy    Nonischemic. EF has been about 45%. S/P CABG.;  b.  Echo 4/14: EF 25%, global HK with inf and mid apical AK,  restrictive physiology with E/e' > 15 (elevated LV filling pressure), trivial AI/MR, mild to mod LAE, mild RVE, mild reduced RVSF, mild RAE, mod TR, PASP 74 (severe pulmonary HTN)    CHF (congestive heart failure) (HCC)    CHF exacerbation (HCC) 07/31/2016   Colon polyps 2002   Coronary artery disease    Diabetes mellitus without complication (HCC)    Type II   Dyslipidemia    hx (03/20/2017)   GERD (gastroesophageal reflux disease)    History of bleeding peptic ulcer    History of echocardiogram    Echo 10/16:  EF 25-30%, poss non-compaction, diff HK with inf-lat and apical HK, restrictive physio, mild AI, severe LAE, mild RVE with mild reduced RVSF, PASP 42 mmHg   Hyperplastic rectal polyp     Moderate episode of recurrent major depressive disorder ([place]) 08/20/2016   09/14/20 - not current   OSA on CPAP    Mild   Pneumonia 09/2018   Tobacco abuse    Remote   Past Surgical History:  Procedure Laterality Date   CARDIAC CATHETERIZATION  2007;  2009   CARDIAC DEFIBRILLATOR PLACEMENT  03/20/2017   COLONOSCOPY WITH PROPOFOL N/A 01/21/2019   Procedure: COLONOSCOPY WITH PROPOFOL;  Surgeon: Milus Banister, MD;  Location: [facility];  Service: Endoscopy;  Laterality: N/A;   CORONARY ARTERY BYPASS GRAFT  2007   Had a left main dissection after catheterization. Underwent a saphenous vein graft to the LAD and a saphenous vein graft to obtuse marginal.   HEMORRHOID SURGERY N/A 08/23/2020   Procedure: HEMORRHOIDECTOMY WITH LIGATION AND HEMORRHOIDOPEXY;  Surgeon: Michael Boston, MD;  Location: [facility];  Service: General;  Laterality: N/A;   ICD IMPLANT N/A 03/20/2017   Procedure: ICD Implant;  Surgeon: Constance Haw, MD;  Location: [place] [facility];  Service: Cardiovascular;  Laterality: N/A;   IR FLUORO GUIDE CV LINE RIGHT  04/29/2022   IR US GUIDE VASC ACCESS RIGHT  04/29/2022   PATELLAR TENDON REPAIR Right 2005   PATELLAR TENDON REPAIR Right 09/21/2020   Procedure: PATELLA TENDON REPAIR;  Surgeon: Leandrew Koyanagi, MD;  Location: [place];  Service: Orthopedics;  Laterality: Right;   PLACEMENT OF IMPELLA LEFT VENTRICULAR ASSIST DEVICE N/A 08/22/2022   Procedure: PLACEMENT OF IMPELLA 5.5 LEFT VENTRICULAR ASSIST DEVICE;  Surgeon: Dahlia Byes, MD;  Location: [place];  Service: Open Heart Surgery;  Laterality: N/A;   POLYPECTOMY  01/21/2019   Procedure: POLYPECTOMY;  Surgeon: Milus Banister, MD;  [facility];  Service: Endoscopy;;   RECTAL EXAM UNDER ANESTHESIA Left 08/23/2020   Procedure: RECTAL EXAM UNDER ANESTHESIA;  Surgeon: Michael Boston, MD;  Location: [facility];  Service: General;  Laterality: Left;   RIGHT HEART CATH N/A 04/25/2022   Procedure: RIGHT HEART CATH;   Surgeon: Jolaine Artist, MD;  Location: [place] [facility];  Service: Cardiovascular;  Laterality: N/A;   RIGHT HEART CATH N/A 08/20/2022   Procedure: RIGHT HEART CATH;  Surgeon: Jolaine Artist, MD;  Location: [place] [facility];  Service: Cardiovascular;  Laterality: N/A;   TEE WITHOUT CARDIOVERSION N/A 08/22/2022   Procedure: TRANSESOPHAGEAL ECHOCARDIOGRAM (TEE);  Surgeon: Dahlia Byes, MD;  Location: [place];  Service: Open Heart Surgery;  Laterality: N/A;   Family History  Problem Relation Age of Onset   Hypertension Mother    Osteoarthritis Mother    Heart failure Father 85   High blood pressure Father    Bipolar disorder Daughter    Diabetes Daughter  Cancer Maternal Uncle        uncertain type   Cancer Maternal Grandmother        uncertain type   Diabetes Niece    Lung cancer Other    Asthma Neg Hx    Breast cancer Neg Hx    Social History   Socioeconomic History   Marital status: Significant Other    Spouse name: Not on file   Number of children: 5   Years of education: Not on file   Highest education level: High school graduate  Occupational History   Occupation: Civil engineer, contracting    Employer: [employer]    Comment: Disability 4 years  Tobacco Use   Smoking status: Former    Packs/day: 0.25    Years: 7.00    Total pack years: 1.75    Types: Cigarettes   Smokeless tobacco: Never   Tobacco comments:    03/20/2017 "quit in 1997  Vaping Use   Vaping Use: Former   Substances: [place]: vaped for about a month  Substance and Sexual Activity   Alcohol use: Not Currently    Comment: socially   Drug use: Yes    Frequency: 1.0 times per week    Types: Marijuana   Sexual activity: Yes  Other Topics Concern   Not on file  Social History Narrative   Single   Social Determinants of Health   Financial Resource Strain: Low Risk  (12/20/2022)   Overall Financial Resource Strain (CARDIA)    Difficulty of Paying Living Expenses: Not hard at all   Food Insecurity: No Food Insecurity (12/20/2022)   Hunger Vital Sign    Worried About Running Out of Food in the Last Year: Never true    [place] in the Last Year: Never true  Transportation Needs: No Transportation Needs (12/20/2022)   PRAPARE - Hydrologist (Medical): No    Lack of Transportation (Non-Medical): No  Physical Activity: Sufficiently Active (12/20/2022)   Exercise Vital Sign    Days of Exercise per Week: 3 days    Minutes of Exercise per Session: 60 min  Stress: No Stress Concern Present (12/20/2022)   [place]    Feeling of Stress : Not at all  Social Connections: Moderately Integrated (12/20/2022)   Social Connection and Isolation Panel [NHANES]    Frequency of Communication with Friends and Family: More than three times a week    Frequency of Social Gatherings with Friends and Family: More than three times a week    Attends Religious Services: More than 4 times per year    Active Member of [employer] or Organizations: Yes    Attends Music therapist: More than 4 times per year    Marital Status: Never married    Tobacco Counseling Counseling given: Yes Tobacco comments: 03/20/2017 "quit in 1997   Clinical Intake:  Pre-visit preparation completed: Yes  Pain : No/denies painNutrition Risk Assessment:  Has the patient had any N/V/D within the last 2 months?  No  Does the patient have any non-healing wounds?  No  Has the patient had any unintentional weight loss or weight gain?  No   Diabetes:  Is the patient diabetic?  Yes  If diabetic, was a CBG obtained today?  Yes CBG 139/Monitor Did the patient bring in their glucometer from home?  No  How often do you monitor your CBG's? Daily/Monitor.  Financial Strains and Diabetes Management:  Are you having any financial strains with the device, your supplies or your medication? No .  Does the patient  want to be seen by Chronic Care Management for management of their diabetes?  No  Would the patient like to be referred to a Nutritionist or for Diabetic Management?  No   Diabetic Exams:  Diabetic Eye Exam: Completed No. Overdue for diabetic eye exam. Pt has been advised about the importance in completing this exam. A referral has been placed today. Message sent to referral coordinator for scheduling purposes. Advised pt to expect a call from office referred to regarding appt.  Diabetic Foot Exam: Completed No. Pt has been advised about the importance in completing this exam. Pt is scheduled for diabetic foot exam on Followed by PCP and [facility]     BMI - recorded: 28.39 Nutritional Status: BMI 25 -29 Overweight Nutritional Risks: None Diabetes: No  How often do you need to have someone help you when you read instructions, pamphlets, or other written materials from your doctor or pharmacy?: 1 - Never  Diabetic?  Yes  Interpreter Needed?: No  Information entered by :: Rolene Arbour LPN   Activities of Daily Living    12/20/2022   10:18 AM 08/22/2022    8:28 AM  In your present state of health, do you have any difficulty performing the following activities:  Hearing? 0 0  Vision? 0 0  Difficulty concentrating or making decisions? 0 0  Walking or climbing stairs? 0 0  Dressing or bathing? 0 0  Doing errands, shopping? 0 0  Preparing Food and eating ? N   Using the Toilet? N   In the past six months, have you accidently leaked urine? N   Do you have problems with loss of bowel control? N   Managing your Medications? N   Managing your Finances? N   Housekeeping or managing your Housekeeping? N     Patient Care Team: Constance Haw, MD as PCP - Electrophysiology (Cardiology) Minus Breeding, MD as PCP - [facility] Access (Cardiology) Minus Breeding, MD as PCP - Cardiology (Cardiology) Constance Haw, MD as Consulting Physician (Cardiology) Milus Banister, MD as Attending Physician (Gastroenterology) Michael Boston, MD as Consulting Physician (General Surgery) Riccardo Dubin, MD as Referring Physician (Cardiology) Bensimhon, Shaune Pascal, MD as Consulting Physician (Cardiology) Maren Reamer, [facility] (Pharmacist)  Indicate any recent Medical Services you may have received from other than [facility] providers in the past year (date may be approximate).     Assessment:   This is a routine wellness examination for Bruce Robertson.  Hearing/Vision screen Hearing Screening - Comments:: Denies hearing difficulties   Vision Screening - Comments:: Wears rx glasses - up to date with routine eye exams with  [facility]  Dietary issues and exercise activities discussed: Current Exercise Habits: Home exercise routine, Type of exercise: walking, Time (Minutes): 60, Frequency (Times/Week): 3, Weekly Exercise (Minutes/Week): 180, Intensity: Moderate, Exercise limited by: None identified   Goals Addressed               This Visit's Progress     Stay Healthy (pt-stated)        Get back in shape.       Depression Screen    12/20/2022   10:18 AM 10/16/2022   10:29 AM 06/06/2022   11:56 AM 11/30/2021   11:27 AM 11/20/2021    9:29 AM 12/01/2020   10:36 AM 11/14/2020  9:17 AM  PHQ 2/9 Scores  PHQ - 2 Score 0 0 0 1 0 2 1  PHQ- 9 Score  2  6  8     $ Fall Risk    12/20/2022   10:19 AM 11/20/2021    9:29 AM 11/14/2020    9:23 AM  Fall Risk   Falls in the past year? 0 0 0  Number falls in past yr: 0  0  Injury with Fall? 0  0  Risk for fall due to : No Fall Risks Medication side effect Impaired vision;Impaired balance/gait;Impaired mobility  Risk for fall due to: Comment   related to kness and vertigo  Follow up Falls prevention discussed Falls evaluation completed;Education provided;Falls prevention discussed     FALL RISK PREVENTION PERTAINING TO THE HOME:  Any stairs in or around the home? Yes  If so, are there any without handrails? No  Home  free of loose throw rugs in walkways, pet beds, electrical cords, etc? Yes  Adequate lighting in your home to reduce risk of falls? Yes   ASSISTIVE DEVICES UTILIZED TO PREVENT FALLS:  Life alert? No  Use of a cane, walker or w/c? No  Grab bars in the bathroom? No  Shower chair or bench in shower? No  Elevated toilet seat or a handicapped toilet? No   TIMED UP AND GO:  Was the test performed? Yes .  Length of time to ambulate 10 feet: 10 sec.   Gait steady and fast without use of assistive device  Cognitive Function:        12/20/2022   10:21 AM 11/20/2021    9:31 AM 11/14/2020    9:39 AM  6CIT Screen  What Year? 0 points 0 points 0 points  What month? 0 points 0 points 0 points  What time? 0 points 0 points   Count back from 20 0 points 0 points 0 points  Months in reverse 0 points 4 points 4 points  Repeat phrase 0 points 0 points 0 points  Total Score 0 points 4 points     Immunizations Immunization History  Administered Date(s) Administered   Influenza,inj,Quad PF,6+ Mos 11/25/2018, 08/16/2021   Influenza-Unspecified 08/23/2019   PFIZER Comirnaty(Gray Top)Covid-19 Tri-Sucrose Vaccine 05/12/2020, 06/03/2020   PFIZER(Purple Top)SARS-COV-2 Vaccination 05/12/2020, 06/03/2020   Pneumococcal Conjugate-13 11/25/2018    TDAP status: Due, Education has been provided regarding the importance of this vaccine. Advised may receive this vaccine at local pharmacy or Health Dept. Aware to provide a copy of the vaccination record if obtained from local pharmacy or Health Dept. Verbalized acceptance and understanding.  Flu Vaccine status: Up to date    Covid-19 vaccine status: Completed vaccines  Qualifies for Shingles Vaccine? Yes   Zostavax completed No   Shingrix Completed?: No.    Education has been provided regarding the importance of this vaccine. Patient has been advised to call insurance company to determine out of pocket expense if they have not yet received this  vaccine. Advised may also receive vaccine at local pharmacy or Health Dept. Verbalized acceptance and understanding.  Screening Tests Health Maintenance  Topic Date Due   DTaP/Tdap/Td (1 - Tdap) Never done   OPHTHALMOLOGY EXAM  12/20/2022 (Originally 01/03/1971)   Diabetic kidney evaluation - Urine ACR  12/21/2022 (Originally 11/30/2022)   FOOT EXAM  12/21/2022 (Originally 01/03/1971)   COVID-19 Vaccine (5 - 2023-24 season) 01/05/2023 (Originally 07/05/2022)   Zoster Vaccines- Shingrix (1 of 2) 01/15/2023 (Originally 01/03/1982)   HEMOGLOBIN A1C  02/23/2023   Diabetic kidney evaluation - eGFR measurement  08/24/2023   Medicare Annual Wellness (AWV)  12/21/2023   COLONOSCOPY (Pts 45-75yr Insurance coverage will need to be confirmed)  01/20/2029   INFLUENZA VACCINE  Completed   Hepatitis C Screening  Completed   HIV Screening  Completed   HPV VACCINES  Aged Out    Health Maintenance  Health Maintenance Due  Topic Date Due   DTaP/Tdap/Td (1 - Tdap) Never done    Colorectal cancer screening: Type of screening: Colonoscopy. Completed 01/21/19. Repeat every 10 years  Lung Cancer Screening: (Low Dose CT Chest recommended if Age 60-80years, 30 pack-year currently smoking OR have quit w/in 15years.) does qualify.   Lung Cancer Screening Referral: Deferred  Additional Screening:  Hepatitis C Screening: does qualify; Completed 07/31/16  Vision Screening: Recommended annual ophthalmology exams for early detection of glaucoma and other disorders of the eye. Is the patient up to date with their annual eye exam?  Yes  Who is the provider or what is the name of the office in which the patient attends annual eye exams? [facility] If pt is not established with a provider, would they like to be referred to a provider to establish care? No .   Dental Screening: Recommended annual dental exams for proper oral hygiene  Community Resource Referral / Chronic Care Management: CRR required this visit?  No    CCM required this visit?  No      Plan:     I have personally reviewed and noted the following in the patient's chart:   Medical and social history Use of alcohol, tobacco or illicit drugs  Current medications and supplements including opioid prescriptions. Patient is not currently taking opioid prescriptions. Functional ability and status Nutritional status Physical activity Advanced directives List of other physicians Hospitalizations, surgeries, and ER visits in previous 12 months Vitals Screenings to include cognitive, depression, and falls Referrals and appointments  In addition, I have reviewed and discussed with patient certain preventive protocols, quality metrics, and best practice recommendations. A written personalized care plan for preventive services as well as general preventive health recommendations were provided to patient.     BCriselda Peaches LPN   [id number]  Nurse Notes: Patient due Diabetic Kidney Evaluation- Urine ACR

## 2022-12-26 DIAGNOSIS — K922 Gastrointestinal hemorrhage, unspecified: Secondary | ICD-10-CM | POA: Diagnosis not present

## 2022-12-26 DIAGNOSIS — Z941 Heart transplant status: Secondary | ICD-10-CM | POA: Diagnosis not present

## 2022-12-26 DIAGNOSIS — Z48298 Encounter for aftercare following other organ transplant: Secondary | ICD-10-CM | POA: Diagnosis not present

## 2022-12-26 DIAGNOSIS — I10 Essential (primary) hypertension: Secondary | ICD-10-CM | POA: Diagnosis not present

## 2022-12-26 DIAGNOSIS — D849 Immunodeficiency, unspecified: Secondary | ICD-10-CM | POA: Diagnosis not present

## 2022-12-26 DIAGNOSIS — Z2989 Encounter for other specified prophylactic measures: Secondary | ICD-10-CM | POA: Diagnosis not present

## 2022-12-26 DIAGNOSIS — T8621 Heart transplant rejection: Secondary | ICD-10-CM | POA: Diagnosis not present

## 2022-12-26 DIAGNOSIS — Z79899 Other long term (current) drug therapy: Secondary | ICD-10-CM | POA: Diagnosis not present

## 2022-12-26 DIAGNOSIS — Z4821 Encounter for aftercare following heart transplant: Secondary | ICD-10-CM | POA: Diagnosis not present

## 2022-12-26 DIAGNOSIS — G4700 Insomnia, unspecified: Secondary | ICD-10-CM | POA: Diagnosis not present

## 2022-12-26 DIAGNOSIS — E785 Hyperlipidemia, unspecified: Secondary | ICD-10-CM | POA: Diagnosis not present

## 2022-12-26 DIAGNOSIS — D84821 Immunodeficiency due to drugs: Secondary | ICD-10-CM | POA: Diagnosis not present

## 2022-12-31 ENCOUNTER — Encounter: Payer: 59 | Admitting: Family Medicine

## 2023-01-08 DIAGNOSIS — G4731 Primary central sleep apnea: Secondary | ICD-10-CM | POA: Diagnosis not present

## 2023-01-20 DIAGNOSIS — E118 Type 2 diabetes mellitus with unspecified complications: Secondary | ICD-10-CM | POA: Diagnosis not present

## 2023-01-20 NOTE — Progress Notes (Deleted)
Cardiology Clinic Note   Patient Name: Bruce Robertson Date of Encounter: 01/20/2023  Primary Care Provider:  No primary care provider on file. Primary Cardiologist:  Minus Breeding, MD  Patient Profile    Bruce Robertson 62 year old male presents to the clinic today for follow-up evaluation of his acute on chronic systolic CHF.  Past Medical History    Past Medical History:  Diagnosis Date   AICD (automatic cardioverter/defibrillator) present    Anxiety 08/20/2016   Barrett's esophagus 2002   Cardiomyopathy    Nonischemic. EF has been about 45%. S/P CABG.;  b.  Echo 4/14: EF 25%, global HK with inf and mid apical AK, restrictive physiology with E/e' > 15 (elevated LV filling pressure), trivial AI/MR, mild to mod LAE, mild RVE, mild reduced RVSF, mild RAE, mod TR, PASP 74 (severe pulmonary HTN)    CHF (congestive heart failure) (HCC)    CHF exacerbation (HCC) 07/31/2016   Colon polyps 2002   Coronary artery disease    Diabetes mellitus without complication (HCC)    Type II   Dyslipidemia    hx (03/20/2017)   GERD (gastroesophageal reflux disease)    History of bleeding peptic ulcer    History of echocardiogram    Echo 10/16:  EF 25-30%, poss non-compaction, diff HK with inf-lat and apical HK, restrictive physio, mild AI, severe LAE, mild RVE with mild reduced RVSF, PASP 42 mmHg   Hyperplastic rectal polyp    Moderate episode of recurrent major depressive disorder ([place]) 08/20/2016   09/14/20 - not current   OSA on CPAP    Mild   Pneumonia 09/2018   Tobacco abuse    Remote   Past Surgical History:  Procedure Laterality Date   CARDIAC CATHETERIZATION  2007;  2009   CARDIAC DEFIBRILLATOR PLACEMENT  03/20/2017   COLONOSCOPY WITH PROPOFOL N/A 01/21/2019   Procedure: COLONOSCOPY WITH PROPOFOL;  Surgeon: Milus Banister, MD;  Location: [facility];  Service: Endoscopy;  Laterality: N/A;   CORONARY ARTERY BYPASS GRAFT  2007   Had a left main dissection after  catheterization. Underwent a saphenous vein graft to the LAD and a saphenous vein graft to obtuse marginal.   HEMORRHOID SURGERY N/A 08/23/2020   Procedure: HEMORRHOIDECTOMY WITH LIGATION AND HEMORRHOIDOPEXY;  Surgeon: Michael Boston, MD;  Location: [facility];  Service: General;  Laterality: N/A;   ICD IMPLANT N/A 03/20/2017   Procedure: ICD Implant;  Surgeon: Constance Haw, MD;  Location: [place] [facility];  Service: Cardiovascular;  Laterality: N/A;   IR FLUORO GUIDE CV LINE RIGHT  04/29/2022   IR US GUIDE VASC ACCESS RIGHT  04/29/2022   PATELLAR TENDON REPAIR Right 2005   PATELLAR TENDON REPAIR Right 09/21/2020   Procedure: PATELLA TENDON REPAIR;  Surgeon: Leandrew Koyanagi, MD;  Location: [place];  Service: Orthopedics;  Laterality: Right;   PLACEMENT OF IMPELLA LEFT VENTRICULAR ASSIST DEVICE N/A 08/22/2022   Procedure: PLACEMENT OF IMPELLA 5.5 LEFT VENTRICULAR ASSIST DEVICE;  Surgeon: Dahlia Byes, MD;  Location: [place];  Service: Open Heart Surgery;  Laterality: N/A;   POLYPECTOMY  01/21/2019   Procedure: POLYPECTOMY;  Surgeon: Milus Banister, MD;  [facility]: [facility];  Service: Endoscopy;;   RECTAL EXAM UNDER ANESTHESIA Left 08/23/2020   Procedure: RECTAL EXAM UNDER ANESTHESIA;  Surgeon: Michael Boston, MD;  Location: [facility];  Service: General;  Laterality: Left;   RIGHT HEART CATH N/A 04/25/2022   Procedure: RIGHT HEART CATH;  Surgeon: Jolaine Artist, MD;  Location:  [place] [facility];  Service: Cardiovascular;  Laterality: N/A;   RIGHT HEART CATH N/A 08/20/2022   Procedure: RIGHT HEART CATH;  Surgeon: Jolaine Artist, MD;  Location: [place] [facility];  Service: Cardiovascular;  Laterality: N/A;   TEE WITHOUT CARDIOVERSION N/A 08/22/2022   Procedure: TRANSESOPHAGEAL ECHOCARDIOGRAM (TEE);  Surgeon: Dahlia Byes, MD;  Location: [place];  Service: Open Heart Surgery;  Laterality: N/A;    Allergies  No Known Allergies  History of Present Illness    Bruce Robertson has a  PMH of coronary artery disease status post CABG with iatrogenic left main dissection during diagnostic cath in 2007, hyperlipidemia, ischemic cardiomyopathy, ICD, type 2 diabetes, peripheral arterial disease, OSA, CKD stage III, and chronic biventricular heart failure.  He is followed at [facility] for transplant workup.  Underwent heart transplant 10/23.  Postoperative echocardiogram 12/23 showed preserved LVEF and no significant valvular abnormalities.  He was admitted twice 6/23 with acute on chronic biventricular heart failure.  He required diuresis and his GDMT was stopped.  He was placed on milrinone therapy.  He underwent right heart catheterization and was noted to have low output physiology.  He failed milrinone therapy due to low mixed venous saturation.  He was discharged on milrinone 0.25 mcg.  He was also placed on mexiletine due to high PVC burden and later transition to amiodarone for ongoing high PVC burden.  He was followed closely by the advanced heart failure clinic.  When he followed up 10/23 he was noted to have progressive decline.  He was admitted from the Cath Lab with elevated filling pressures and pulmonary venous hypertension as well as reduced cardiac output.  His milrinone therapy was increased to 0.375 mcg.  Cardiothoracic surgery was consulted for mechanical support.  And Impella 5.5 was placed.  His hemodynamics improved however his cardiac output remained marginal.  His milrinone therapy was increased to 0.5 mcg and he was maintained on Impella.  Dr. Posey Pronto at [facility] was contacted for possible transplant.  Patient was transferred on 08/05/2022.  At time of discharge his blood pressure was 110/95 with a pulse of 59.  His weight was noted to be 80 kg.  He presents to the clinic today for follow-up evaluation and states***.  *** denies chest pain, shortness of breath, lower extremity edema, fatigue, palpitations, melena, hematuria, hemoptysis, diaphoresis, weakness, presyncope, syncope,  orthopnea, and PND.  Status post heart transplant-weight today***.  Echocardiogram 12/26/2022 showed preserved LV function and no significant valvular disease.  Underwent right heart cath 10/23.  During that time Impella 5.5 was implanted for further support and he was transferred to [facility] for heart transplant. Continue current medication regimen Heart healthy low-sodium diet Maintain physical activity Follows with [facility] transplant team  Hyperlipidemia-LDL***. Continue statin therapy Heart healthy low-sodium high-fiber diet  Essential hypertension-BP today***. Continue amlodipine Maintain blood pressure log  OSA-reports compliance with BiPAP.  Waking up well rested Continue BiPAP use  Disposition: Follow-up with Dr. [name]   Home Medications    Prior to Admission medications   Medication Sig Start Date End Date Taking? Authorizing Provider  ACCU-CHEK GUIDE test strip Use as directed-patient needs an appt 08/12/22   Farrel Conners, MD  Accu-Chek Softclix Lancets lancets Use as directed 08/13/22   Farrel Conners, MD  albuterol (VENTOLIN HFA) 108 (90 Base) MCG/ACT inhaler Inhale 2 puffs into the lungs every 6 (six) hours as needed for wheezing or shortness of breath. 08/16/22   Farrel Conners, MD  amLODipine [facility])  5 MG tablet Take 5 mg by mouth daily.    [provider]  blood glucose meter kit and supplies KIT Dispense based on patient and insurance preference. Use up to four times daily as directed. 02/14/20   Caren Macadam, MD  digoxin (LANOXIN) 0.125 MG tablet Take 1 tablet (0.125 mg total) by mouth daily. 04/30/22   Clegg, Amy D, NP  doxepin (SINEQUAN) 10 MG capsule Take 1 capsule (10 mg total) by mouth at bedtime. 10/29/22   Farrel Conners, MD  insulin lispro (HUMALOG) 100 UNIT/ML injection Inject 4 Units into the skin 3 (three) times daily before meals.    [provider]  mycophenolate (MYFORTIC) 360 MG TBEC EC tablet Take 720 mg by mouth  2 (two) times daily. 10/12/22   [provider]  NYSTATIN PO Take 5 mLs by mouth in the morning, at noon, in the evening, and at bedtime.    [provider]  omeprazole (PRILOSEC) 40 MG capsule Take 1 capsule (40 mg total) by mouth daily. 04/29/22   Clegg, Amy D, NP  pantoprazole (PROTONIX) 40 MG tablet Take 40 mg by mouth daily.    [provider]  pravastatin (PRAVACHOL) 40 MG tablet Take 40 mg by mouth at bedtime.    [provider]  predniSONE (DELTASONE) 5 MG tablet Take 5 mg by mouth daily with breakfast. 10/08/22 10/08/23  [provider]  sildenafil (VIAGRA) 50 MG tablet Take 1 tablet (50 mg total) by mouth daily as needed for erectile dysfunction. 11/15/22   Farrel Conners, MD  sulfamethoxazole-trimethoprim (BACTRIM DS) 800-160 MG tablet Take 1 tablet by mouth once.    [provider]  Suvorexant (BELSOMRA) 10 MG TABS Take 10 mg by mouth at bedtime. 10/16/22   Farrel Conners, MD  tacrolimus (PROGRAF) 1 MG capsule Take 10 mg by mouth 2 (two) times daily. 10/15/22 10/15/23  [provider]  valACYclovir (VALTREX) 500 MG tablet Take 500 mg by mouth 2 (two) times daily.    [provider]    Family History    Family History  Problem Relation Age of Onset   Hypertension Mother    Osteoarthritis Mother    Heart failure Father 83   High blood pressure Father    Bipolar disorder Daughter    Diabetes Daughter    Cancer Maternal Uncle        uncertain type   Cancer Maternal Grandmother        uncertain type   Diabetes Niece    Lung cancer Other    Asthma Neg Hx    Breast cancer Neg Hx    He indicated that his mother is alive. He indicated that his father is deceased. He indicated that his maternal grandmother is deceased. He indicated that his maternal grandfather is deceased. He indicated that his paternal grandmother is deceased. He indicated that his paternal grandfather is deceased. He indicated that the  status of his daughter is unknown. He indicated that the status of his maternal uncle is unknown. He indicated that the status of his neg hx is unknown. He indicated that the status of his other is unknown. He indicated that the status of his niece is unknown.  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    Comment: Disability 4 years  Tobacco Use   Smoking status: Former    Packs/day: 0.25    Years: 7.00    Additional pack years: 0.00    Total pack years: 1.75    Types: Cigarettes   Smokeless tobacco: Never   Tobacco comments:    03/20/2017 "quit in 1997  Vaping Use   Vaping Use: Former   Substances: [place]: vaped for about a month  Substance and Sexual Activity   Alcohol use: Not Currently    Comment: socially   Drug use: Yes    Frequency: 1.0 times per week    Types: Marijuana   Sexual activity: Yes  Other Topics Concern   Not on file  Social History Narrative   Single   Social Determinants of Health   Financial Resource Strain: Low Risk  (12/20/2022)   Overall Financial Resource Strain (CARDIA)    Difficulty of Paying Living Expenses: Not hard at all  Food Insecurity: No Food Insecurity (12/20/2022)   Hunger Vital Sign    Worried About Running Out of Food in the Last Year: Never true    [place] in the Last Year: Never true  Transportation Needs: No Transportation Needs (12/20/2022)   PRAPARE - Hydrologist (Medical): No    Lack of Transportation (Non-Medical): No  Physical Activity: Sufficiently Active (12/20/2022)   Exercise Vital Sign    Days of Exercise per Week: 3 days    Minutes of Exercise per Session: 60 min  Stress: No Stress Concern Present  (12/20/2022)   [place]    Feeling of Stress : Not at all  Social Connections: Moderately Integrated (12/20/2022)   Social Connection and Isolation Panel [NHANES]    Frequency of Communication with Friends and Family: More than three times a week    Frequency of Social Gatherings with Friends and Family: More than three times a week    Attends Religious Services: More than 4 times per year    Active Member of [employer] or Organizations: Yes    Attends Archivist Meetings: More than 4 times per year    Marital Status: Never married  Intimate Partner Violence: Not At Risk (12/20/2022)   Humiliation, Afraid, Rape, and Kick questionnaire    Fear of Current or Ex-Partner: No    Emotionally Abused: No    Physically Abused: No    Sexually Abused: No     Review of Systems    General:  No chills, fever, night sweats or weight changes.  Cardiovascular:  No chest pain, dyspnea on exertion, edema, orthopnea, palpitations, paroxysmal nocturnal dyspnea. Dermatological: No rash, lesions/masses Respiratory: No cough, dyspnea Urologic: No hematuria, dysuria Abdominal:   No nausea, vomiting, diarrhea, bright red blood per rectum, melena, or hematemesis Neurologic:  No visual changes, wkns, changes in mental status. All other systems reviewed and are otherwise negative except as noted above.  Physical Exam    VS:  There were no vitals taken for this visit. , BMI There is no height or weight on file to calculate BMI. GEN: Well nourished, well developed, in no acute distress. HEENT: normal. Neck: Supple, no JVD, carotid bruits, or masses. Cardiac: RRR, no murmurs, rubs, or gallops. No clubbing, cyanosis, edema.  Radials/DP/PT 2+ and equal bilaterally.  Respiratory:  Respirations regular  and unlabored, clear to auscultation bilaterally. GI: Soft, nontender, nondistended, BS + x 4. MS: no deformity or atrophy. Skin: warm and dry,  no rash. Neuro:  Strength and sensation are intact. Psych: Normal affect.  Accessory Clinical Findings    Recent Labs: 08/19/2022: ALT 28; B Natriuretic Peptide 715.1 08/21/2022: TSH 5.424 08/23/2022: BUN 26; Creatinine, Ser 1.68; Hemoglobin 11.9; Magnesium 2.4; Platelets 180; Potassium 4.7; Sodium 127   Recent Lipid Panel    Component Value Date/Time   CHOL 141 11/30/2021 1301   TRIG 82.0 11/30/2021 1301   HDL 48.90 11/30/2021 1301   CHOLHDL 3 11/30/2021 1301   VLDL 16.4 11/30/2021 1301   LDLCALC 76 11/30/2021 1301   LDLCALC 92 02/11/2020 1641   LDLDIRECT 166.4 05/17/2010 0905    No BP recorded.  {Refresh Note OR Click here to enter BP  :1}***    ECG personally reviewed by me today- *** - No acute changes  Echocardiogram 12/26/2022  ECHOCARDIOGRAPHIC DESCRIPTIONS -----------------------------------------------  AORTIC ROOT          Size: Normal    Dissection: INDETERM FOR DISSECTION   AORTIC VALVE      Leaflets: Tricuspid             Morphology: Normal      Mobility: Fully Mobile   LEFT VENTRICLE                                      Anterior: Normal          Size: Normal                                 Lateral: Normal   Contraction: Normal                                  Septal: Normal    Closest EF: >55% (Estimated)                        Apical: Normal     LV masses: No Masses                             Inferior: Normal           LVH: MILD LVH CONCENTRIC                  Posterior: Normal  Dias.FxClass: N/A   MITRAL VALVE      Leaflets: Normal                  Mobility: Fully mobile    Morphology: Normal   LEFT ATRIUM          Size: MODERATELY ENLARGED     LA masses: No masses                Normal IAS   MAIN PA          Size: Not seen   PULMONIC VALVE    Morphology: Normal      Mobility: Fully Mobile   RIGHT VENTRICLE          Size: Normal                    Free  wall: Normal   Contraction: Normal                    RV masses: No Masses                 CATHETER SEEN THROUGH TRICUSPID VALVE   TRICUSPID VALVE      Leaflets: Normal                  Mobility: Fully mobile    Morphology: Normal   RIGHT ATRIUM          Size: Normal                     RA Other: None     RA masses: No masses   PERICARDIUM        Fluid: No effusion   INFERIOR VENACAVA          Size: Normal     Normal respiratory collapse   DOPPLER ECHO and OTHER SPECIAL PROCEDURES ------------------------------------     Aortic: No AR                  No AS      Mitral: No MR                  No MS     MV Inflow E Vel.= 80.0 cm/s  MV Annulus E'Vel.= 10.0 cm/s  E/E'Ratio= 8   Tricuspid: MILD TR                No TS             2.9 m/s peak TR vel   37 mmHg peak RV pressure   Pulmonary: TRIVIAL PR             No PS       Other:   INTERPRETATION ---------------------------------------------------------------    NORMAL LEFT VENTRICULAR SYSTOLIC FUNCTION WITH MILD LVH    NORMAL RIGHT VENTRICULAR SYSTOLIC FUNCTION    VALVULAR REGURGITATION: TRIVIAL PR, MILD TR    NO VALVULAR STENOSIS    NORMAL TRAVERSAL OF TV    Limited echo to assess for RV biopsy.     Compared with prior Echo study on 11/27/2022: no significant change.      Assessment & Plan   1.  ***   Jossie Ng. Haidar Muse NP-C     01/20/2023, 6:22 AM [place] [address] Office [phone] Fax [phone]    I spent***minutes examining this patient, reviewing medications, and using patient centered shared decision making involving her cardiac care.  Prior to her visit I spent greater than 20 minutes reviewing her past medical history,  medications, and prior cardiac tests.

## 2023-01-21 ENCOUNTER — Ambulatory Visit: Payer: 59 | Admitting: General Practice

## 2023-01-28 DIAGNOSIS — G4700 Insomnia, unspecified: Secondary | ICD-10-CM | POA: Diagnosis not present

## 2023-01-28 DIAGNOSIS — Z4821 Encounter for aftercare following heart transplant: Secondary | ICD-10-CM | POA: Diagnosis not present

## 2023-01-28 DIAGNOSIS — Z941 Heart transplant status: Secondary | ICD-10-CM | POA: Diagnosis not present

## 2023-01-28 DIAGNOSIS — Z79899 Other long term (current) drug therapy: Secondary | ICD-10-CM | POA: Diagnosis not present

## 2023-01-28 DIAGNOSIS — R634 Abnormal weight loss: Secondary | ICD-10-CM | POA: Diagnosis not present

## 2023-01-28 DIAGNOSIS — Z48298 Encounter for aftercare following other organ transplant: Secondary | ICD-10-CM | POA: Diagnosis not present

## 2023-01-28 DIAGNOSIS — I10 Essential (primary) hypertension: Secondary | ICD-10-CM | POA: Diagnosis not present

## 2023-01-28 DIAGNOSIS — M1611 Unilateral primary osteoarthritis, right hip: Secondary | ICD-10-CM | POA: Diagnosis not present

## 2023-01-28 DIAGNOSIS — Z2989 Encounter for other specified prophylactic measures: Secondary | ICD-10-CM | POA: Diagnosis not present

## 2023-01-28 DIAGNOSIS — F17210 Nicotine dependence, cigarettes, uncomplicated: Secondary | ICD-10-CM | POA: Diagnosis not present

## 2023-02-08 DIAGNOSIS — G4731 Primary central sleep apnea: Secondary | ICD-10-CM | POA: Diagnosis not present

## 2023-02-10 DIAGNOSIS — Z941 Heart transplant status: Secondary | ICD-10-CM | POA: Diagnosis not present

## 2023-02-10 DIAGNOSIS — Z2989 Encounter for other specified prophylactic measures: Secondary | ICD-10-CM | POA: Diagnosis not present

## 2023-02-10 DIAGNOSIS — Z48298 Encounter for aftercare following other organ transplant: Secondary | ICD-10-CM | POA: Diagnosis not present

## 2023-02-10 DIAGNOSIS — Z79899 Other long term (current) drug therapy: Secondary | ICD-10-CM | POA: Diagnosis not present

## 2023-02-14 ENCOUNTER — Telehealth: Payer: Self-pay

## 2023-02-14 DIAGNOSIS — Z2989 Encounter for other specified prophylactic measures: Secondary | ICD-10-CM | POA: Diagnosis not present

## 2023-02-14 DIAGNOSIS — Z941 Heart transplant status: Secondary | ICD-10-CM | POA: Diagnosis not present

## 2023-02-14 DIAGNOSIS — Z79899 Other long term (current) drug therapy: Secondary | ICD-10-CM | POA: Diagnosis not present

## 2023-02-14 DIAGNOSIS — D849 Immunodeficiency, unspecified: Secondary | ICD-10-CM | POA: Diagnosis not present

## 2023-02-14 DIAGNOSIS — Z48298 Encounter for aftercare following other organ transplant: Secondary | ICD-10-CM | POA: Diagnosis not present

## 2023-02-14 NOTE — Progress Notes (Signed)
Care Management & Coordination Services Pharmacy Team  Reason for Encounter: Hypertension  Contacted patient to discuss hypertension disease state. Spoke with patient on 02/14/2023     Current antihypertensive regimen:  Amlodipine 5 mg daily Patient verbally confirms he is taking the above medications as directed. Yes  How often are you checking your Blood Pressure? Patient has been checking his blood pressures daily, he checks his blood pressure at various times  Current home BP readings: Patient doesn't have his actual readings however, he states his blood pressure has been steady in the 130's/80's.   Wrist or arm cuff: Arm cuff  OTC medications including pseudoephedrine or NSAIDs?  Patient denies  Any readings above 180/100?  Patient denies  What recent interventions/DTPs have been made by any provider to improve Blood Pressure control since last CPP Visit: No recent interventions  Any recent hospitalizations or ED visits since last visit with CPP? No recent hospital visits.   What diet changes have been made to improve Blood Pressure Control?  Patient follows healthy choices and low potassium Breakfast - cereal or eggs with sausage and toast Lunch - salads and fruit and nuts Dinner - meat with vegetables Caffeine - coffee couple times per week and soda 1-2 daily Salt - no salt added  What exercise is being done to improve your Blood Pressure Control?  Patient has recently started back at the gym, weights and cardio  Adherence Review: Is the patient currently on ACE/ARB medication? No Does the patient have >5 day gap between last estimated fill dates? No  Care Gaps: AWV - completed 12/20/2022,  scheduled 12/31/2023 Next appt - 06/06/2023 Last eye exam - never done Last foot exam - never done Last BP - 120/62 on 12/20/2022 Tdap - never done Shingrix - never done Covid - overdue Urine ACR - overdue  Star Rating Drugs:  Pravastatin 40 mg - last filled 12/12/2022  90 DS at [facility]   Chart Updates: Recent office visits:  12/20/2022 Theresa Mulligan LPN - Encounter for Medicare annual wellness exam   Recent consult visits:  12/26/2022 Florestine Avers MD (cardiology) - Patient was seen for immunosuppression and additional concerns. Started Nystatin swish and swallow qid.   12/26/2022 Cheral Bay MD (cardiology) - Patient was seen for heart replaced by transplant and additional concerns. No medication changes.   Hospital visits:  None  Medications: Outpatient Encounter Medications as of 02/14/2023  Medication Sig   ACCU-CHEK GUIDE test strip Use as directed-patient needs an appt   Accu-Chek Softclix Lancets lancets Use as directed   albuterol (VENTOLIN HFA) 108 (90 Base) MCG/ACT inhaler Inhale 2 puffs into the lungs every 6 (six) hours as needed for wheezing or shortness of breath.   amLODipine (NORVASC) 5 MG tablet Take 5 mg by mouth daily.   blood glucose meter kit and supplies KIT Dispense based on patient and insurance preference. Use up to four times daily as directed.   digoxin (LANOXIN) 0.125 MG tablet Take 1 tablet (0.125 mg total) by mouth daily.   doxepin (SINEQUAN) 10 MG capsule Take 1 capsule (10 mg total) by mouth at bedtime.   insulin lispro (HUMALOG) 100 UNIT/ML injection Inject 4 Units into the skin 3 (three) times daily before meals.   mycophenolate (MYFORTIC) 360 MG TBEC EC tablet Take 720 mg by mouth 2 (two) times daily.   NYSTATIN PO Take 5 mLs by mouth in the morning, at noon, in the evening, and at bedtime.   omeprazole (PRILOSEC) 40 MG capsule Take  1 capsule (40 mg total) by mouth daily.   pantoprazole (PROTONIX) 40 MG tablet Take 40 mg by mouth daily.   pravastatin (PRAVACHOL) 40 MG tablet Take 40 mg by mouth at bedtime.   predniSONE (DELTASONE) 5 MG tablet Take 5 mg by mouth daily with breakfast.   sildenafil (VIAGRA) 50 MG tablet Take 1 tablet (50 mg total) by mouth daily as needed for erectile dysfunction.    sulfamethoxazole-trimethoprim (BACTRIM DS) 800-160 MG tablet Take 1 tablet by mouth once.   Suvorexant (BELSOMRA) 10 MG TABS Take 10 mg by mouth at bedtime.   tacrolimus (PROGRAF) 1 MG capsule Take 10 mg by mouth 2 (two) times daily.   valACYclovir (VALTREX) 500 MG tablet Take 500 mg by mouth 2 (two) times daily.   No facility-administered encounter medications on file as of 02/14/2023.  Fill History:   Dispensed Days Supply Quantity Provider Pharmacy  ALBUTEROL HFA (9GM) AER 08/16/2022 24 9 g      Dispensed Days Supply Quantity Provider Pharmacy  AMLODIPINE 5MG  TAB 01/18/2023 30 30 each      Dispensed Days Supply Quantity Provider Pharmacy  DIGOXIN 0.125MG      TAB 08/11/2022 30 30 each      Dispensed Days Supply Quantity Provider Pharmacy  DOXEPIN HCL 10MG  CAP 10/31/2022 90 90 each      Dispensed Days Supply Quantity Provider Pharmacy  INSULIN LISPRO PEN 100/ML INJ 12/19/2022 75 9 mL      Dispensed Days Supply Quantity Provider Pharmacy  MYCOPHENOLIC 360MG  DR TAB 01/09/2023 30 120 each      Dispensed Days Supply Quantity Provider Pharmacy  OMEPRAZOLE DR 40MG   CAP 11/05/2022 30 30 each      Dispensed Days Supply Quantity Provider Pharmacy  PANTOPRAZOLE SOD 40MG  TAB 01/09/2023 30 30 each      Dispensed Days Supply Quantity Provider Pharmacy  PRAVASTATIN 40MG     TAB 12/12/2022 90 90 each      Dispensed Days Supply Quantity Provider Pharmacy  BELSOMRA 20MG        TAB 12/07/2022 30 30 each      Dispensed Days Supply Quantity Provider Pharmacy  TACROLIMUS 1MG  CAP 01/29/2023 30 600 each      Dispensed Days Supply Quantity Provider Pharmacy  VALACYCLOVIR HYDROCHLORIDE  500 MG TABS 09/30/2022 30 60 tablet     Recent Office Vitals: BP Readings from Last 3 Encounters:  12/20/22 120/62  10/16/22 116/76  08/23/22 (!) 110/95   Pulse Readings from Last 3 Encounters:  12/20/22 90  10/16/22 94  08/23/22 (!) 59    Wt Readings from Last 3 Encounters:  12/20/22 220 lb 1.6 oz  (99.8 kg)  10/16/22 192 lb 14.4 oz (87.5 kg)  08/23/22 176 lb 5.9 oz (80 kg)     Kidney Function Lab Results  Component Value Date/Time   CREATININE 1.68 (H) 08/23/2022 04:13 AM   CREATININE 1.91 (H) 08/22/2022 04:27 AM   CREATININE 1.60 (H) 02/11/2020 04:41 PM   CREATININE 1.45 (H) 01/23/2017 10:52 AM   GFR 47.04 (L) 01/03/2022 08:22 AM   GFRNONAA 47 (L) 08/23/2022 04:13 AM   GFRAA 42 (L) 07/24/2020 11:07 AM       Latest Ref Rng & Units 08/23/2022    4:27 AM 08/23/2022    4:13 AM 08/22/2022    1:56 PM  BMP  Glucose 70 - 99 mg/dL  709    BUN 6 - 20 mg/dL  26    Creatinine 6.28 - 1.24 mg/dL  3.66  Sodium 135 - 145 mmol/L 127  132  131   Potassium 3.5 - 5.1 mmol/L 4.7  4.8  4.1   Chloride 98 - 111 mmol/L  92    CO2 22 - 32 mmol/L  26    Calcium 8.9 - 10.3 mg/dL  9.7     Inetta Fermo [facility]  Clinical Pharmacist Assistant [phone]

## 2023-02-26 ENCOUNTER — Emergency Department (HOSPITAL_COMMUNITY): Payer: 59

## 2023-02-26 ENCOUNTER — Emergency Department (HOSPITAL_COMMUNITY)
Admission: EM | Admit: 2023-02-26 | Discharge: 2023-02-26 | Disposition: A | Discharge status: Home / Self Care | Payer: 59 | Attending: Emergency Medicine | Admitting: Emergency Medicine

## 2023-02-26 ENCOUNTER — Other Ambulatory Visit: Payer: Self-pay

## 2023-02-26 ENCOUNTER — Encounter (HOSPITAL_COMMUNITY): Payer: Self-pay | Admitting: Emergency Medicine

## 2023-02-26 DIAGNOSIS — I480 Paroxysmal atrial fibrillation: Secondary | ICD-10-CM | POA: Insufficient documentation

## 2023-02-26 DIAGNOSIS — Z941 Heart transplant status: Secondary | ICD-10-CM | POA: Diagnosis not present

## 2023-02-26 DIAGNOSIS — N289 Disorder of kidney and ureter, unspecified: Secondary | ICD-10-CM | POA: Diagnosis not present

## 2023-02-26 DIAGNOSIS — R1010 Upper abdominal pain, unspecified: Secondary | ICD-10-CM | POA: Diagnosis present

## 2023-02-26 DIAGNOSIS — R1013 Epigastric pain: Secondary | ICD-10-CM | POA: Insufficient documentation

## 2023-02-26 DIAGNOSIS — N189 Chronic kidney disease, unspecified: Secondary | ICD-10-CM | POA: Insufficient documentation

## 2023-02-26 DIAGNOSIS — K449 Diaphragmatic hernia without obstruction or gangrene: Secondary | ICD-10-CM | POA: Diagnosis not present

## 2023-02-26 DIAGNOSIS — K3189 Other diseases of stomach and duodenum: Secondary | ICD-10-CM | POA: Diagnosis not present

## 2023-02-26 DIAGNOSIS — R1011 Right upper quadrant pain: Secondary | ICD-10-CM | POA: Diagnosis not present

## 2023-02-26 LAB — CBC WITH DIFFERENTIAL/PLATELET
Abs Immature Granulocytes: 0.09 10*3/uL — ABNORMAL HIGH (ref 0.00–0.07)
Basophils Absolute: 0 10*3/uL (ref 0.0–0.1)
Basophils Relative: 0 %
Eosinophils Absolute: 0.1 10*3/uL (ref 0.0–0.5)
Eosinophils Relative: 1 %
HCT: 43.3 % (ref 39.0–52.0)
Hemoglobin: 13.1 g/dL (ref 13.0–17.0)
Immature Granulocytes: 2 %
Lymphocytes Relative: 27 %
Lymphs Abs: 1.4 10*3/uL (ref 0.7–4.0)
MCH: 25.4 pg — ABNORMAL LOW (ref 26.0–34.0)
MCHC: 30.3 g/dL (ref 30.0–36.0)
MCV: 84.1 fL (ref 80.0–100.0)
Monocytes Absolute: 0.5 10*3/uL (ref 0.1–1.0)
Monocytes Relative: 10 %
Neutro Abs: 3.1 10*3/uL (ref 1.7–7.7)
Neutrophils Relative %: 60 %
Platelets: 184 10*3/uL (ref 150–400)
RBC: 5.15 MIL/uL (ref 4.22–5.81)
RDW: 15.8 % — ABNORMAL HIGH (ref 11.5–15.5)
WBC: 5.2 10*3/uL (ref 4.0–10.5)
nRBC: 0 % (ref 0.0–0.2)

## 2023-02-26 LAB — COMPREHENSIVE METABOLIC PANEL
ALT: 22 U/L (ref 0–44)
AST: 24 U/L (ref 15–41)
Albumin: 4.2 g/dL (ref 3.5–5.0)
Alkaline Phosphatase: 46 U/L (ref 38–126)
Anion gap: 10 (ref 5–15)
BUN: 12 mg/dL (ref 6–20)
CO2: 23 mmol/L (ref 22–32)
Calcium: 9.8 mg/dL (ref 8.9–10.3)
Chloride: 104 mmol/L (ref 98–111)
Creatinine, Ser: 1.47 mg/dL — ABNORMAL HIGH (ref 0.61–1.24)
GFR, Estimated: 54 mL/min — ABNORMAL LOW (ref >60)
Glucose, Bld: 124 mg/dL — ABNORMAL HIGH (ref 70–99)
Potassium: 3.8 mmol/L (ref 3.5–5.1)
Sodium: 137 mmol/L (ref 135–145)
Total Bilirubin: 1 mg/dL (ref 0.3–1.2)
Total Protein: 6.9 g/dL (ref 6.5–8.1)

## 2023-02-26 LAB — TROPONIN I (HIGH SENSITIVITY): Troponin I (High Sensitivity): 11 ng/L (ref <18)

## 2023-02-26 LAB — LIPASE, BLOOD: Lipase: 33 U/L (ref 11–51)

## 2023-02-26 LAB — MAGNESIUM: Magnesium: 1.9 mg/dL (ref 1.7–2.4)

## 2023-02-26 MED ORDER — ONDANSETRON 8 MG PO TBDP: 8.0000 mg | ORAL_TABLET | Freq: Three times a day (TID) | ORAL | 0 refills | Status: DC | PRN

## 2023-02-26 MED ORDER — HYDROCODONE-ACETAMINOPHEN 5-325 MG PO TABS: 1.0000 | ORAL_TABLET | Freq: Four times a day (QID) | ORAL | 0 refills | Status: DC | PRN

## 2023-02-26 MED ORDER — ONDANSETRON 4 MG PO TBDP
8.0000 mg | ORAL_TABLET | Freq: Once | ORAL | Status: AC
  Administered 2023-02-26: 8 mg via ORAL
  Filled 2023-02-26: qty 2

## 2023-02-26 MED ORDER — SODIUM CHLORIDE 0.9 % IV BOLUS (SEPSIS)
1000.0000 mL | Freq: Once | INTRAVENOUS | Status: AC
  Administered 2023-02-26: 1000 mL via INTRAVENOUS

## 2023-02-26 MED ORDER — MORPHINE SULFATE (PF) 4 MG/ML IV SOLN
4.0000 mg | Freq: Once | INTRAVENOUS | Status: AC
  Administered 2023-02-26: 4 mg via INTRAVENOUS
  Filled 2023-02-26: qty 1

## 2023-02-26 MED ORDER — SODIUM CHLORIDE 0.9 % IV SOLN
1000.0000 mL | INTRAVENOUS | Status: DC
  Administered 2023-02-26: 1000 mL via INTRAVENOUS

## 2023-02-26 MED ORDER — ONDANSETRON HCL 4 MG/2ML IJ SOLN
4.0000 mg | Freq: Once | INTRAMUSCULAR | Status: AC
  Administered 2023-02-26: 4 mg via INTRAVENOUS
  Filled 2023-02-26: qty 2

## 2023-02-26 MED ORDER — HYDROCODONE-ACETAMINOPHEN 5-325 MG PO TABS
2.0000 | ORAL_TABLET | Freq: Once | ORAL | Status: AC
  Administered 2023-02-26: 2 via ORAL
  Filled 2023-02-26: qty 2

## 2023-02-26 MED ORDER — IOHEXOL 350 MG/ML SOLN
75.0000 mL | Freq: Once | INTRAVENOUS | Status: AC | PRN
  Administered 2023-02-26: 75 mL via INTRAVENOUS

## 2023-02-26 MED ORDER — PANTOPRAZOLE SODIUM 40 MG PO TBEC: 40.0000 mg | DELAYED_RELEASE_TABLET | Freq: Every day | ORAL | 0 refills | Status: DC

## 2023-02-26 MED ORDER — SUCRALFATE 1 G PO TABS: 1.0000 g | ORAL_TABLET | Freq: Three times a day (TID) | ORAL | 0 refills | Status: DC

## 2023-02-26 MED ORDER — PANTOPRAZOLE SODIUM 40 MG IV SOLR
40.0000 mg | Freq: Once | INTRAVENOUS | Status: AC
  Administered 2023-02-26: 40 mg via INTRAVENOUS
  Filled 2023-02-26: qty 10

## 2023-02-26 NOTE — Discharge Instructions (Signed)
Take the medications as prescribed to help with your pain and discomfort.  Follow-up with a GI doctor for further evaluation.  Return as needed for worsening symptoms.

## 2023-02-26 NOTE — ED Provider Triage Note (Addendum)
Emergency Medicine Provider Triage Evaluation Note  Bruce Robertson , a 62 y.o. male  was evaluated in triage.  Pt complains of abdominal pain that started about 1 week ago but significantly worsened about 3 days ago.  Associated with nausea, vomiting, difficulty tolerating p.o. intake.  Endorses black stools.  No prior history of GI bleed.  Does state he has history of gastric ulcers.  Currently not on a PPI.  2 days ago he developed shortness of breath.  Since yesterday he has had some chest pain as well.  Patient has a heart transplant patient.  Transplant according the patient was done at [facility], in October 2023.  Compliant with all of his transplant medications.  Denies current anticoagulation use.  Review of Systems  Positive: As above Negative: As above  Physical Exam  BP (!) 134/106 (BP Location: Right Arm)   Pulse 93   Temp 98.4 F (36.9 C) (Oral)   Resp 18   Ht 6' (1.829 m)   Wt 93 kg   SpO2 100%   BMI 27.80 kg/m  Gen:   Awake, no distress   Resp:  Normal effort  MSK:   Moves extremities without difficulty  Other:    Medical Decision Making  Medically screening exam initiated at 12:16 PM.  Appropriate orders placed.  Bruce Robertson was informed that the remainder of the evaluation will be completed by another provider, this initial triage assessment does not replace that evaluation, and the importance of remaining in the ED until their evaluation is complete.     Marita Kansas, PA-C 02/26/23 1218  Notified nursing to prioritize room for this patient.   Marita Kansas, PA-C 02/26/23 1218

## 2023-02-26 NOTE — ED Triage Notes (Signed)
Pt presents with upper, mid abd pain, n/v/diarrhea (dark stool) x 3 days, denies fevers, stepson has had n/v

## 2023-02-26 NOTE — ED Provider Notes (Signed)
[facility] AT [facility] Provider Note   CSN: [id number] Arrival date & time: 02/26/23  1133     History  Chief Complaint  Patient presents with   Abdominal Pain    Bruce Robertson is a 62 y.o. male.   Abdominal Pain    Patient has history of dilated cardiomyopathy, AICD, reflux, chronic kidney disease, paroxysmal atrial fibrillation, Barrett's esophagus, status post cardiac transplant performed last year.  Patient is followed at [facility].  He is on immunosuppressive agents.  Patient states he started having trouble with nausea vomiting abdominal pain last week or so.  He has had multiple episodes of vomiting as well as diarrhea.  He denies any fever.  He is having pain mostly in his upper abdomen.  Pain was severe in intensity so he came to the ED for evaluation.  No history of prior abdominal surgeries.  He has not noticed any blood in his stool.  No blood in his emesis.  No dysuria  Home Medications Prior to Admission medications   Medication Sig Start Date End Date Taking? Authorizing Provider  HYDROcodone-acetaminophen (NORCO/VICODIN) 5-325 MG tablet Take 1 tablet by mouth every 6 (six) hours as needed. 02/26/23  Yes Linwood Dibbles, MD  ondansetron (ZOFRAN-ODT) 8 MG disintegrating tablet Take 1 tablet (8 mg total) by mouth every 8 (eight) hours as needed for nausea or vomiting. 02/26/23  Yes Linwood Dibbles, MD  pantoprazole (PROTONIX) 40 MG tablet Take 1 tablet (40 mg total) by mouth daily. 02/26/23  Yes Linwood Dibbles, MD  sucralfate (CARAFATE) 1 g tablet Take 1 tablet (1 g total) by mouth 4 (four) times daily -  with meals and at bedtime. 02/26/23  Yes Linwood Dibbles, MD  ACCU-CHEK GUIDE test strip Use as directed-patient needs an appt 08/12/22   Karie Georges, MD  Accu-Chek Softclix Lancets lancets Use as directed 08/13/22   Karie Georges, MD  albuterol (VENTOLIN HFA) 108 (90 Base) MCG/ACT inhaler Inhale 2 puffs into the lungs every 6 (six) hours as needed  for wheezing or shortness of breath. 08/16/22   Karie Georges, MD  amLODipine (NORVASC) 5 MG tablet Take 5 mg by mouth daily.    [provider]  blood glucose meter kit and supplies KIT Dispense based on patient and insurance preference. Use up to four times daily as directed. 02/14/20   Wynn Banker, MD  digoxin (LANOXIN) 0.125 MG tablet Take 1 tablet (0.125 mg total) by mouth daily. 04/30/22   Clegg, Amy D, NP  doxepin (SINEQUAN) 10 MG capsule Take 1 capsule (10 mg total) by mouth at bedtime. 10/29/22   Karie Georges, MD  insulin lispro (HUMALOG) 100 UNIT/ML injection Inject 4 Units into the skin 3 (three) times daily before meals.    [provider]  mycophenolate (MYFORTIC) 360 MG TBEC EC tablet Take 720 mg by mouth 2 (two) times daily. 10/12/22   [provider]  NYSTATIN PO Take 5 mLs by mouth in the morning, at noon, in the evening, and at bedtime.    [provider]  omeprazole (PRILOSEC) 40 MG capsule Take 1 capsule (40 mg total) by mouth daily. 04/29/22   Clegg, Amy D, NP  pravastatin (PRAVACHOL) 40 MG tablet Take 40 mg by mouth at bedtime.    [provider]  predniSONE (DELTASONE) 5 MG tablet Take 5 mg by mouth daily with breakfast. 10/08/22 10/08/23  [provider]  sildenafil (VIAGRA) 50 MG tablet Take 1 tablet (  50 mg total) by mouth daily as needed for erectile dysfunction. 11/15/22   Karie Georges, MD  sulfamethoxazole-trimethoprim (BACTRIM DS) 800-160 MG tablet Take 1 tablet by mouth once.    [provider]  Suvorexant (BELSOMRA) 10 MG TABS Take 10 mg by mouth at bedtime. 10/16/22   Karie Georges, MD  tacrolimus (PROGRAF) 1 MG capsule Take 10 mg by mouth 2 (two) times daily. 10/15/22 10/15/23  [provider]  valACYclovir (VALTREX) 500 MG tablet Take 500 mg by mouth 2 (two) times daily.    [provider]      Allergies    Patient has no known allergies.    Review of Systems    Review of Systems  Gastrointestinal:  Positive for abdominal pain.    Physical Exam Updated Vital Signs BP 116/86   Pulse 85   Temp 98.4 F (36.9 C) (Oral)   Resp 18   Ht 1.829 m (6')   Wt 93 kg   SpO2 98%   BMI 27.80 kg/m  Physical Exam Vitals and nursing note reviewed.  Constitutional:      General: He is not in acute distress.    Appearance: He is well-developed.  HENT:     Head: Normocephalic and atraumatic.     Right Ear: External ear normal.     Left Ear: External ear normal.  Eyes:     General: No scleral icterus.       Right eye: No discharge.        Left eye: No discharge.     Conjunctiva/sclera: Conjunctivae normal.  Neck:     Trachea: No tracheal deviation.  Cardiovascular:     Rate and Rhythm: Normal rate and regular rhythm.  Pulmonary:     Effort: Pulmonary effort is normal. No respiratory distress.     Breath sounds: Normal breath sounds. No stridor. No wheezing or rales.  Abdominal:     General: Bowel sounds are normal. There is no distension.     Palpations: Abdomen is soft.     Tenderness: There is generalized abdominal tenderness and tenderness in the epigastric area. There is guarding. There is no rebound.  Musculoskeletal:        General: No tenderness or deformity.     Cervical back: Neck supple.  Skin:    General: Skin is warm and dry.     Findings: No rash.  Neurological:     General: No focal deficit present.     Mental Status: He is alert.     Cranial Nerves: No cranial nerve deficit, dysarthria or facial asymmetry.     Sensory: No sensory deficit.     Motor: No abnormal muscle tone or seizure activity.     Coordination: Coordination normal.  Psychiatric:        Mood and Affect: Mood normal.     ED Results / Procedures / Treatments   Labs (all labs ordered are listed, but only abnormal results are displayed) Labs Reviewed  CBC WITH DIFFERENTIAL/PLATELET - Abnormal; Notable for the following components:      Result Value   MCH  25.4 (*)    RDW 15.8 (*)    Abs Immature Granulocytes 0.09 (*)    All other components within normal limits  COMPREHENSIVE METABOLIC PANEL - Abnormal; Notable for the following components:   Glucose, Bld 124 (*)    Creatinine, Ser 1.47 (*)    GFR, Estimated 54 (*)    All other components within normal limits  MAGNESIUM  LIPASE, BLOOD  URINALYSIS, ROUTINE W REFLEX MICROSCOPIC  TROPONIN I (HIGH SENSITIVITY)    EKG EKG Interpretation  Date/Time:  Wednesday February 26 2023 15:11:55 EDT Ventricular Rate:  87 PR Interval:  138 QRS Duration: 94 QT Interval:  356 QTC Calculation: 429 R Axis:   69 Text Interpretation: Sinus rhythm qt duration decreased left posterior fascicular block resolved Since last tracing Confirmed by Linwood Dibbles [phone]) on 02/26/2023 3:19:54 PM  Radiology US Abdomen Limited RUQ (LIVER/GB)  Result Date: 02/26/2023 CLINICAL DATA:  Right upper quadrant pain EXAM: ULTRASOUND ABDOMEN LIMITED RIGHT UPPER QUADRANT COMPARISON:  None Available. FINDINGS: Gallbladder: No gallstones. No pericholecystic fluid. Gallbladder wall measures up to 3 mm, at the upper limit of normal. No sonographic Murphy sign noted by sonographer. Common bile duct: Diameter: 2 mm, within normal limits. No intrahepatic biliary ductal dilatation. Liver: No focal lesion identified. Within normal limits in parenchymal echogenicity. Portal vein is patent on color Doppler imaging with normal direction of blood flow towards the liver. Other: None. IMPRESSION: No evidence of cholecystitis. No acute finding in the right upper quadrant. Electronically Signed   By: Wiliam Ke M.D.   On: 02/26/2023 19:07   CT ABDOMEN PELVIS W CONTRAST  Result Date: 02/26/2023 CLINICAL DATA:  Abdominal pain.  History of heart transplant. EXAM: CT ABDOMEN AND PELVIS WITH CONTRAST TECHNIQUE: Multidetector CT imaging of the abdomen and pelvis was performed using the standard protocol following bolus administration of intravenous  contrast. RADIATION DOSE REDUCTION: This exam was performed according to the departmental dose-optimization program which includes automated exposure control, adjustment of the mA and/or kV according to patient size and/or use of iterative reconstruction technique. CONTRAST:  75mL OMNIPAQUE IOHEXOL 350 MG/ML SOLN COMPARISON:  CT 04/29/2015 FINDINGS: Lower chest: Elevation of the right hemidiaphragm adjacent right lung base bandlike opacity, likely scar or atelectasis. No pleural effusion. Status post median sternotomy. Changes of known heart transplant. No significant pericardial effusion. Small hiatal hernia. Hepatobiliary: Fatty liver infiltration. Patent portal vein. Gallbladder is mildly distended. Pancreas: Mild ectasia of the pancreatic duct in the head and neck. No pancreatic mass identified. Spleen: Normal in size without focal abnormality. Adrenals/Urinary Tract: Left adrenal gland is preserved. The right is slightly thickened with similar to the study of 2016. Focal atrophy along the upper pole of the right kidney. There are some tiny cystic foci in each kidney. These are relatively nonaggressive and quite small, too small to completely characterize but likely benign cysts. Bosniak 2 lesions. There are some larger foci along the lower pole of the left kidney example measures 2.4 cm on image 28 of series 8 with Hounsfield unit of 6. Focus just superior and posterior to this has Hounsfield unit of 15 and diameter of 17 mm. No specific imaging follow-up. No collecting system dilatation. The ureters have normal course and caliber extending down to the bladder. Preserved contours of the urinary bladder. Stomach/Bowel: Large bowel has a normal course and caliber with scattered stool. Normal appendix. Stomach is underdistended but has diffuse wall thickening. Slight adjacent vascular engorgement. Please correlate for any particular symptoms and further workup as appropriate. Small-bowel is nondilated.  Vascular/Lymphatic: Normal caliber aorta and IVC with scattered vascular calcifications. No specific abnormal lymph node enlargement identified including lower mediastinum, greater lesser curve of the stomach, porta hepatis, retrocrural, retroperitoneal or pelvic sidewall. No mesenteric nodes. Reproductive: Prostate is unremarkable. Other: No free air or free fluid. Small fat containing umbilical hernia. Musculoskeletal: Mild degenerative changes seen along the spine.  Scattered areas of disc bulging with some slight canal encroachment at multiple levels. Chondrocalcinosis along the pubic symphysis. IMPRESSION: No bowel obstruction, free air or free fluid.  Normal appendix. There is wall thickening along the underdistended stomach with some vascular engorgement. Please correlate for any symptoms including for gastritis further evaluation as clinically appropriate. Small hiatal hernia. Elevated right hemidiaphragm. Surgical changes of known heart transplant. Mildly distended gallbladder. If there is concern specifically of gallbladder pathology, ultrasound may be useful as the next step in evaluation. Electronically Signed   By: Karen Kays M.D.   On: 02/26/2023 17:37    Procedures Procedures    Medications Ordered in ED Medications  sodium chloride 0.9 % bolus 1,000 mL (0 mLs Intravenous Stopped 02/26/23 1605)    Followed by  0.9 %  sodium chloride infusion (1,000 mLs Intravenous New Bag/Given 02/26/23 1904)  HYDROcodone-acetaminophen (NORCO/VICODIN) 5-325 MG per tablet 2 tablet (2 tablets Oral Given 02/26/23 1236)  ondansetron (ZOFRAN-ODT) disintegrating tablet 8 mg (8 mg Oral Given 02/26/23 1236)  morphine (PF) 4 MG/ML injection 4 mg (4 mg Intravenous Given 02/26/23 1517)  ondansetron (ZOFRAN) injection 4 mg (4 mg Intravenous Given 02/26/23 1521)  iohexol (OMNIPAQUE) 350 MG/ML injection 75 mL (75 mLs Intravenous Contrast Given 02/26/23 1721)  pantoprazole (PROTONIX) injection 40 mg (40 mg Intravenous  Given 02/26/23 1901)  morphine (PF) 4 MG/ML injection 4 mg (4 mg Intravenous Given 02/26/23 1900)    ED Course/ Medical Decision Making/ A&P Clinical Course as of 02/26/23 2142  Wed Feb 26, 2023  1455 CBC with Differential/Platelet(!) nl [JK]  1455 Creatinine(!): 1.47 Elevated but decreased since baseline [JK]  1455 Troponin I (High Sensitivity) nl [JK]  1456 Lipase, blood nl [JK]  1753 CT scan does not show evidence of obstruction.  There is some wall thickening and some vascular engorgement around the stomach.  Gastritis is a possibility.  There is also mildly distended gallbladder.  Ultrasound recommended for further evaluation [JK]  2103 Ultrasound does not show any evidence of cholecystitis [JK]    Clinical Course User Index [JK] Linwood Dibbles, MD                             Medical Decision Making Differential diagnosis includes but not limited to gastritis, gastroenteritis, peptic ulcer disease, pancreatitis, biliary colic, bowel obstruction  Amount and/or Complexity of Data Reviewed Labs:  Decision-making details documented in ED Course. Radiology: ordered.  Risk Prescription drug management.   Patient has complicated medical history including heart transplant.  Patient is on immunosuppressive agents.  Patient's ED workup is overall reassuring.  CBC is normal.  Metabolic panel shows elevated creatinine but unchanged from baseline and no significant electrolyte abnormalities.  Patient's lipase and troponins were normal.  Patient did have focal tenderness in the upper abdomen.  Additionally patient had an abdominal pelvic CT that showed questions of gallstones as well as gastritis.  Patient is not showing any evidence of acute GI bleeding.  Ultrasound was performed and it does not show evidence of cholecystitis or biliary colic.  Is possible the patient might be having issues with peptic ulcer disease or gastritis.  Considering his history we will recommend close outpatient  follow-up with GI.  Will discharge home with antacids.  Warning signs precautions discussed.        Final Clinical Impression(s) / ED Diagnoses Final diagnoses:  Epigastric pain    Rx / DC Orders ED Discharge Orders  Ordered    HYDROcodone-acetaminophen (NORCO/VICODIN) 5-325 MG tablet  Every 6 hours PRN        02/26/23 2137    pantoprazole (PROTONIX) 40 MG tablet  Daily        02/26/23 2137    sucralfate (CARAFATE) 1 g tablet  3 times daily with meals & bedtime        02/26/23 2137    ondansetron (ZOFRAN-ODT) 8 MG disintegrating tablet  Every 8 hours PRN        02/26/23 2137              Linwood Dibbles, MD 02/26/23 2142

## 2023-02-26 NOTE — ED Notes (Signed)
Pt reports hx of heart transplant, and stomach ulcers

## 2023-03-03 DIAGNOSIS — G4700 Insomnia, unspecified: Secondary | ICD-10-CM | POA: Diagnosis not present

## 2023-03-03 DIAGNOSIS — I129 Hypertensive chronic kidney disease with stage 1 through stage 4 chronic kidney disease, or unspecified chronic kidney disease: Secondary | ICD-10-CM | POA: Diagnosis not present

## 2023-03-03 DIAGNOSIS — I428 Other cardiomyopathies: Secondary | ICD-10-CM | POA: Diagnosis not present

## 2023-03-03 DIAGNOSIS — N183 Chronic kidney disease, stage 3 unspecified: Secondary | ICD-10-CM | POA: Diagnosis not present

## 2023-03-03 DIAGNOSIS — Z4821 Encounter for aftercare following heart transplant: Secondary | ICD-10-CM | POA: Diagnosis not present

## 2023-03-03 DIAGNOSIS — Z941 Heart transplant status: Secondary | ICD-10-CM | POA: Diagnosis not present

## 2023-03-03 DIAGNOSIS — N189 Chronic kidney disease, unspecified: Secondary | ICD-10-CM | POA: Diagnosis not present

## 2023-03-03 DIAGNOSIS — Z79899 Other long term (current) drug therapy: Secondary | ICD-10-CM | POA: Diagnosis not present

## 2023-03-03 DIAGNOSIS — K922 Gastrointestinal hemorrhage, unspecified: Secondary | ICD-10-CM | POA: Diagnosis not present

## 2023-03-03 DIAGNOSIS — Z48298 Encounter for aftercare following other organ transplant: Secondary | ICD-10-CM | POA: Diagnosis not present

## 2023-03-03 DIAGNOSIS — D849 Immunodeficiency, unspecified: Secondary | ICD-10-CM | POA: Diagnosis not present

## 2023-03-03 DIAGNOSIS — Z2989 Encounter for other specified prophylactic measures: Secondary | ICD-10-CM | POA: Diagnosis not present

## 2023-03-04 ENCOUNTER — Telehealth: Payer: Self-pay

## 2023-03-04 NOTE — Telephone Encounter (Signed)
Transition Care Management Unsuccessful Follow-up Telephone Call  Date of discharge and from where:  02/26/2023 [facility].  Attempts:  1st Attempt  Reason for unsuccessful TCM follow-up call:  Left voice message  Bruce Robertson [place]  [facility] Population Health Community Resource Care Guide   ??[email or web address]  ?? [phone]   Website: [email or web address]  [email or web address]      

## 2023-03-05 ENCOUNTER — Telehealth: Payer: Self-pay

## 2023-03-05 NOTE — Telephone Encounter (Signed)
Transition Care Management Follow-up Telephone Call Date of discharge and from where: 02/26/2023 [facility]. How have you been since you were released from the hospital? Patient stated he is feeling better. Any questions or concerns? No  Items Reviewed: Did the pt receive and understand the discharge instructions provided? Yes  Medications obtained and verified? Yes  Other? Yes  Any new allergies since your discharge? No  Dietary orders reviewed? Yes Do you have support at home?  Patient does not need assistance at this time.  Follow up appointments reviewed:  PCP Hospital f/u appt confirmed? No  Scheduled to see  on  @ . Specialist Hospital f/u appt confirmed? No  Scheduled to see  on  @ . Are transportation arrangements needed? No  If their condition worsens, is the pt aware to call PCP or go to the Emergency Dept.? Yes Was the patient provided with contact information for the PCP's office or ED? Yes Was to pt encouraged to call back with questions or concerns? Yes  Ericah Scotto [facility]  [facility] Population Health Community Resource Care Guide   [email or web address]  ?? [phone]   Website: [email or web address]  [email or web address]

## 2023-03-06 DIAGNOSIS — D849 Immunodeficiency, unspecified: Secondary | ICD-10-CM | POA: Diagnosis not present

## 2023-03-06 DIAGNOSIS — Z2989 Encounter for other specified prophylactic measures: Secondary | ICD-10-CM | POA: Diagnosis not present

## 2023-03-06 DIAGNOSIS — Z48298 Encounter for aftercare following other organ transplant: Secondary | ICD-10-CM | POA: Diagnosis not present

## 2023-03-06 DIAGNOSIS — Z79899 Other long term (current) drug therapy: Secondary | ICD-10-CM | POA: Diagnosis not present

## 2023-03-06 DIAGNOSIS — Z941 Heart transplant status: Secondary | ICD-10-CM | POA: Diagnosis not present

## 2023-03-10 DIAGNOSIS — G4731 Primary central sleep apnea: Secondary | ICD-10-CM | POA: Diagnosis not present

## 2023-03-19 DIAGNOSIS — R918 Other nonspecific abnormal finding of lung field: Secondary | ICD-10-CM | POA: Diagnosis not present

## 2023-03-19 DIAGNOSIS — Z79624 Long term (current) use of inhibitors of nucleotide synthesis: Secondary | ICD-10-CM | POA: Diagnosis not present

## 2023-03-19 DIAGNOSIS — Z79899 Other long term (current) drug therapy: Secondary | ICD-10-CM | POA: Diagnosis not present

## 2023-03-19 DIAGNOSIS — D84821 Immunodeficiency due to drugs: Secondary | ICD-10-CM | POA: Diagnosis not present

## 2023-03-19 DIAGNOSIS — Z4821 Encounter for aftercare following heart transplant: Secondary | ICD-10-CM | POA: Diagnosis not present

## 2023-03-19 DIAGNOSIS — R051 Acute cough: Secondary | ICD-10-CM | POA: Diagnosis not present

## 2023-03-19 DIAGNOSIS — Z792 Long term (current) use of antibiotics: Secondary | ICD-10-CM | POA: Diagnosis not present

## 2023-03-19 DIAGNOSIS — I10 Essential (primary) hypertension: Secondary | ICD-10-CM | POA: Diagnosis not present

## 2023-03-19 DIAGNOSIS — Z7952 Long term (current) use of systemic steroids: Secondary | ICD-10-CM | POA: Diagnosis not present

## 2023-03-19 DIAGNOSIS — Z48298 Encounter for aftercare following other organ transplant: Secondary | ICD-10-CM | POA: Diagnosis not present

## 2023-03-19 DIAGNOSIS — N183 Chronic kidney disease, stage 3 unspecified: Secondary | ICD-10-CM | POA: Diagnosis not present

## 2023-03-19 DIAGNOSIS — Z941 Heart transplant status: Secondary | ICD-10-CM | POA: Diagnosis not present

## 2023-03-19 DIAGNOSIS — D849 Immunodeficiency, unspecified: Secondary | ICD-10-CM | POA: Diagnosis not present

## 2023-03-19 DIAGNOSIS — Z79621 Long term (current) use of calcineurin inhibitor: Secondary | ICD-10-CM | POA: Diagnosis not present

## 2023-03-19 DIAGNOSIS — G4700 Insomnia, unspecified: Secondary | ICD-10-CM | POA: Diagnosis not present

## 2023-03-19 DIAGNOSIS — I130 Hypertensive heart and chronic kidney disease with heart failure and stage 1 through stage 4 chronic kidney disease, or unspecified chronic kidney disease: Secondary | ICD-10-CM | POA: Diagnosis not present

## 2023-03-19 DIAGNOSIS — Z2989 Encounter for other specified prophylactic measures: Secondary | ICD-10-CM | POA: Diagnosis not present

## 2023-03-25 DIAGNOSIS — R140 Abdominal distension (gaseous): Secondary | ICD-10-CM | POA: Diagnosis not present

## 2023-03-25 DIAGNOSIS — R1013 Epigastric pain: Secondary | ICD-10-CM | POA: Diagnosis not present

## 2023-03-25 DIAGNOSIS — R1084 Generalized abdominal pain: Secondary | ICD-10-CM | POA: Diagnosis not present

## 2023-03-25 DIAGNOSIS — R194 Change in bowel habit: Secondary | ICD-10-CM | POA: Diagnosis not present

## 2023-03-25 DIAGNOSIS — R195 Other fecal abnormalities: Secondary | ICD-10-CM | POA: Diagnosis not present

## 2023-03-25 DIAGNOSIS — Z1211 Encounter for screening for malignant neoplasm of colon: Secondary | ICD-10-CM | POA: Diagnosis not present

## 2023-03-25 DIAGNOSIS — K219 Gastro-esophageal reflux disease without esophagitis: Secondary | ICD-10-CM | POA: Diagnosis not present

## 2023-04-21 DIAGNOSIS — E118 Type 2 diabetes mellitus with unspecified complications: Secondary | ICD-10-CM | POA: Diagnosis not present

## 2023-04-30 DIAGNOSIS — Z48298 Encounter for aftercare following other organ transplant: Secondary | ICD-10-CM | POA: Diagnosis not present

## 2023-04-30 DIAGNOSIS — Z941 Heart transplant status: Secondary | ICD-10-CM | POA: Diagnosis not present

## 2023-04-30 DIAGNOSIS — Z79624 Long term (current) use of inhibitors of nucleotide synthesis: Secondary | ICD-10-CM | POA: Diagnosis not present

## 2023-04-30 DIAGNOSIS — Z79621 Long term (current) use of calcineurin inhibitor: Secondary | ICD-10-CM | POA: Diagnosis not present

## 2023-04-30 DIAGNOSIS — G4700 Insomnia, unspecified: Secondary | ICD-10-CM | POA: Diagnosis not present

## 2023-04-30 DIAGNOSIS — Z7952 Long term (current) use of systemic steroids: Secondary | ICD-10-CM | POA: Diagnosis not present

## 2023-04-30 DIAGNOSIS — T86298 Other complications of heart transplant: Secondary | ICD-10-CM | POA: Diagnosis not present

## 2023-04-30 DIAGNOSIS — N189 Chronic kidney disease, unspecified: Secondary | ICD-10-CM | POA: Diagnosis not present

## 2023-04-30 DIAGNOSIS — Z79899 Other long term (current) drug therapy: Secondary | ICD-10-CM | POA: Diagnosis not present

## 2023-04-30 DIAGNOSIS — I129 Hypertensive chronic kidney disease with stage 1 through stage 4 chronic kidney disease, or unspecified chronic kidney disease: Secondary | ICD-10-CM | POA: Diagnosis not present

## 2023-04-30 DIAGNOSIS — D84821 Immunodeficiency due to drugs: Secondary | ICD-10-CM | POA: Diagnosis not present

## 2023-04-30 DIAGNOSIS — Z4821 Encounter for aftercare following heart transplant: Secondary | ICD-10-CM | POA: Diagnosis not present

## 2023-04-30 DIAGNOSIS — I361 Nonrheumatic tricuspid (valve) insufficiency: Secondary | ICD-10-CM | POA: Diagnosis not present

## 2023-04-30 DIAGNOSIS — E785 Hyperlipidemia, unspecified: Secondary | ICD-10-CM | POA: Diagnosis not present

## 2023-04-30 DIAGNOSIS — Z2989 Encounter for other specified prophylactic measures: Secondary | ICD-10-CM | POA: Diagnosis not present

## 2023-04-30 DIAGNOSIS — K922 Gastrointestinal hemorrhage, unspecified: Secondary | ICD-10-CM | POA: Diagnosis not present

## 2023-05-22 DIAGNOSIS — Z941 Heart transplant status: Secondary | ICD-10-CM | POA: Diagnosis not present

## 2023-05-22 DIAGNOSIS — D849 Immunodeficiency, unspecified: Secondary | ICD-10-CM | POA: Diagnosis not present

## 2023-05-22 DIAGNOSIS — Z2989 Encounter for other specified prophylactic measures: Secondary | ICD-10-CM | POA: Diagnosis not present

## 2023-05-22 DIAGNOSIS — Z48298 Encounter for aftercare following other organ transplant: Secondary | ICD-10-CM | POA: Diagnosis not present

## 2023-05-22 DIAGNOSIS — Z79899 Other long term (current) drug therapy: Secondary | ICD-10-CM | POA: Diagnosis not present

## 2023-06-03 DIAGNOSIS — Z2989 Encounter for other specified prophylactic measures: Secondary | ICD-10-CM | POA: Diagnosis not present

## 2023-06-03 DIAGNOSIS — Z941 Heart transplant status: Secondary | ICD-10-CM | POA: Diagnosis not present

## 2023-06-03 DIAGNOSIS — Z48298 Encounter for aftercare following other organ transplant: Secondary | ICD-10-CM | POA: Diagnosis not present

## 2023-06-03 DIAGNOSIS — Z79899 Other long term (current) drug therapy: Secondary | ICD-10-CM | POA: Diagnosis not present

## 2023-06-17 DIAGNOSIS — N183 Chronic kidney disease, stage 3 unspecified: Secondary | ICD-10-CM | POA: Diagnosis not present

## 2023-06-17 DIAGNOSIS — D849 Immunodeficiency, unspecified: Secondary | ICD-10-CM | POA: Diagnosis not present

## 2023-06-17 DIAGNOSIS — I129 Hypertensive chronic kidney disease with stage 1 through stage 4 chronic kidney disease, or unspecified chronic kidney disease: Secondary | ICD-10-CM | POA: Diagnosis not present

## 2023-06-17 DIAGNOSIS — R112 Nausea with vomiting, unspecified: Secondary | ICD-10-CM | POA: Diagnosis not present

## 2023-06-17 DIAGNOSIS — N179 Acute kidney failure, unspecified: Secondary | ICD-10-CM | POA: Diagnosis not present

## 2023-06-17 DIAGNOSIS — Z48298 Encounter for aftercare following other organ transplant: Secondary | ICD-10-CM | POA: Diagnosis not present

## 2023-06-17 DIAGNOSIS — K922 Gastrointestinal hemorrhage, unspecified: Secondary | ICD-10-CM | POA: Diagnosis not present

## 2023-06-17 DIAGNOSIS — Z2989 Encounter for other specified prophylactic measures: Secondary | ICD-10-CM | POA: Diagnosis not present

## 2023-06-17 DIAGNOSIS — Z79899 Other long term (current) drug therapy: Secondary | ICD-10-CM | POA: Diagnosis not present

## 2023-06-17 DIAGNOSIS — G4700 Insomnia, unspecified: Secondary | ICD-10-CM | POA: Diagnosis not present

## 2023-06-17 DIAGNOSIS — Z79624 Long term (current) use of inhibitors of nucleotide synthesis: Secondary | ICD-10-CM | POA: Diagnosis not present

## 2023-06-17 DIAGNOSIS — T8621 Heart transplant rejection: Secondary | ICD-10-CM | POA: Diagnosis not present

## 2023-06-17 DIAGNOSIS — Z4821 Encounter for aftercare following heart transplant: Secondary | ICD-10-CM | POA: Diagnosis not present

## 2023-06-17 DIAGNOSIS — Z941 Heart transplant status: Secondary | ICD-10-CM | POA: Diagnosis not present

## 2023-06-17 DIAGNOSIS — I10 Essential (primary) hypertension: Secondary | ICD-10-CM | POA: Diagnosis not present

## 2023-06-23 DIAGNOSIS — D849 Immunodeficiency, unspecified: Secondary | ICD-10-CM | POA: Diagnosis not present

## 2023-06-23 DIAGNOSIS — Z2989 Encounter for other specified prophylactic measures: Secondary | ICD-10-CM | POA: Diagnosis not present

## 2023-06-23 DIAGNOSIS — Z48298 Encounter for aftercare following other organ transplant: Secondary | ICD-10-CM | POA: Diagnosis not present

## 2023-06-23 DIAGNOSIS — Z79899 Other long term (current) drug therapy: Secondary | ICD-10-CM | POA: Diagnosis not present

## 2023-06-23 DIAGNOSIS — Z941 Heart transplant status: Secondary | ICD-10-CM | POA: Diagnosis not present

## 2023-06-23 IMAGING — DX DG CHEST 2V
2 series · 2 of 2 positions shown · non-contrast
Comparison: Chest radiograph 05/30/2021

CLINICAL DATA: Productive cough for 2 weeks

EXAM:
CHEST - 2 VIEW

[chest pa]
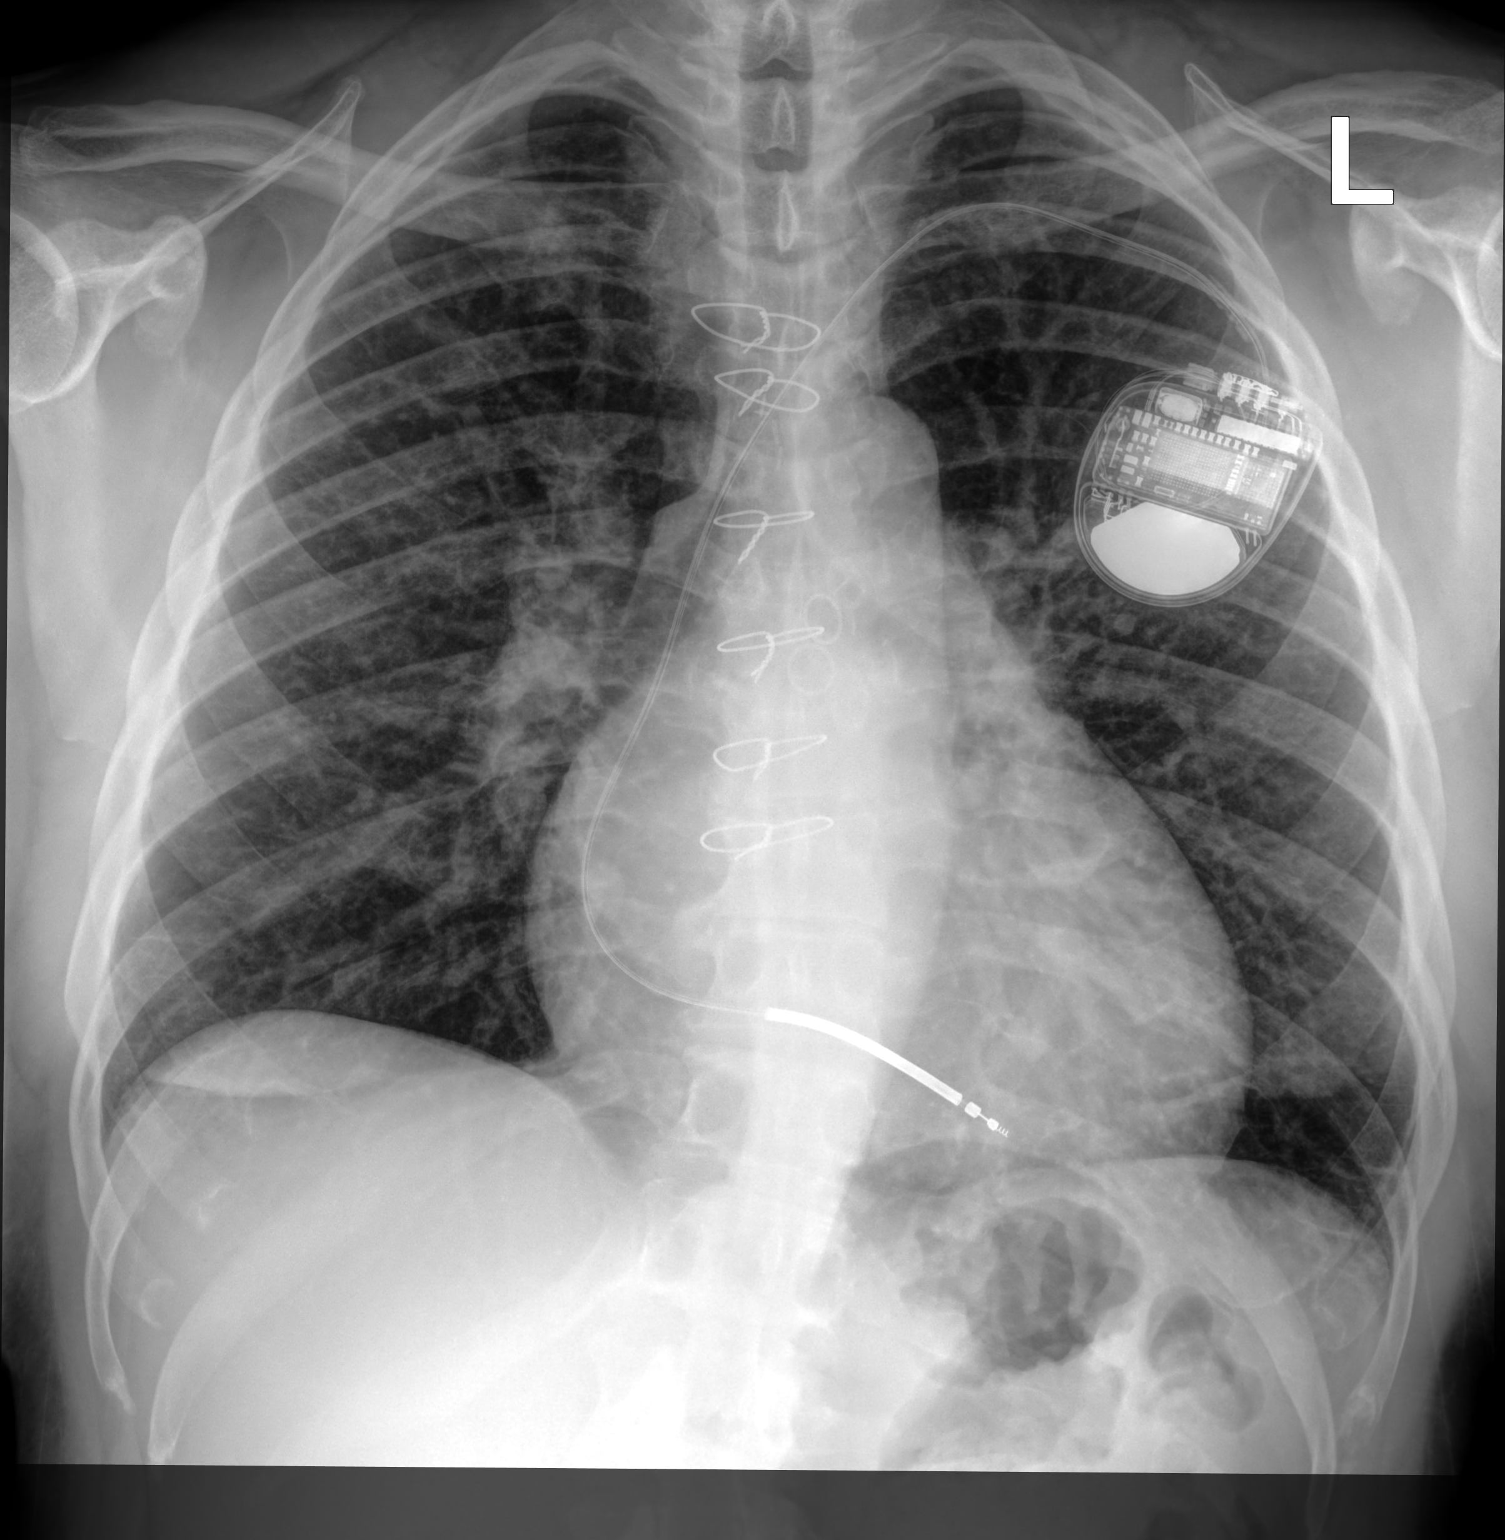

[chest lat]
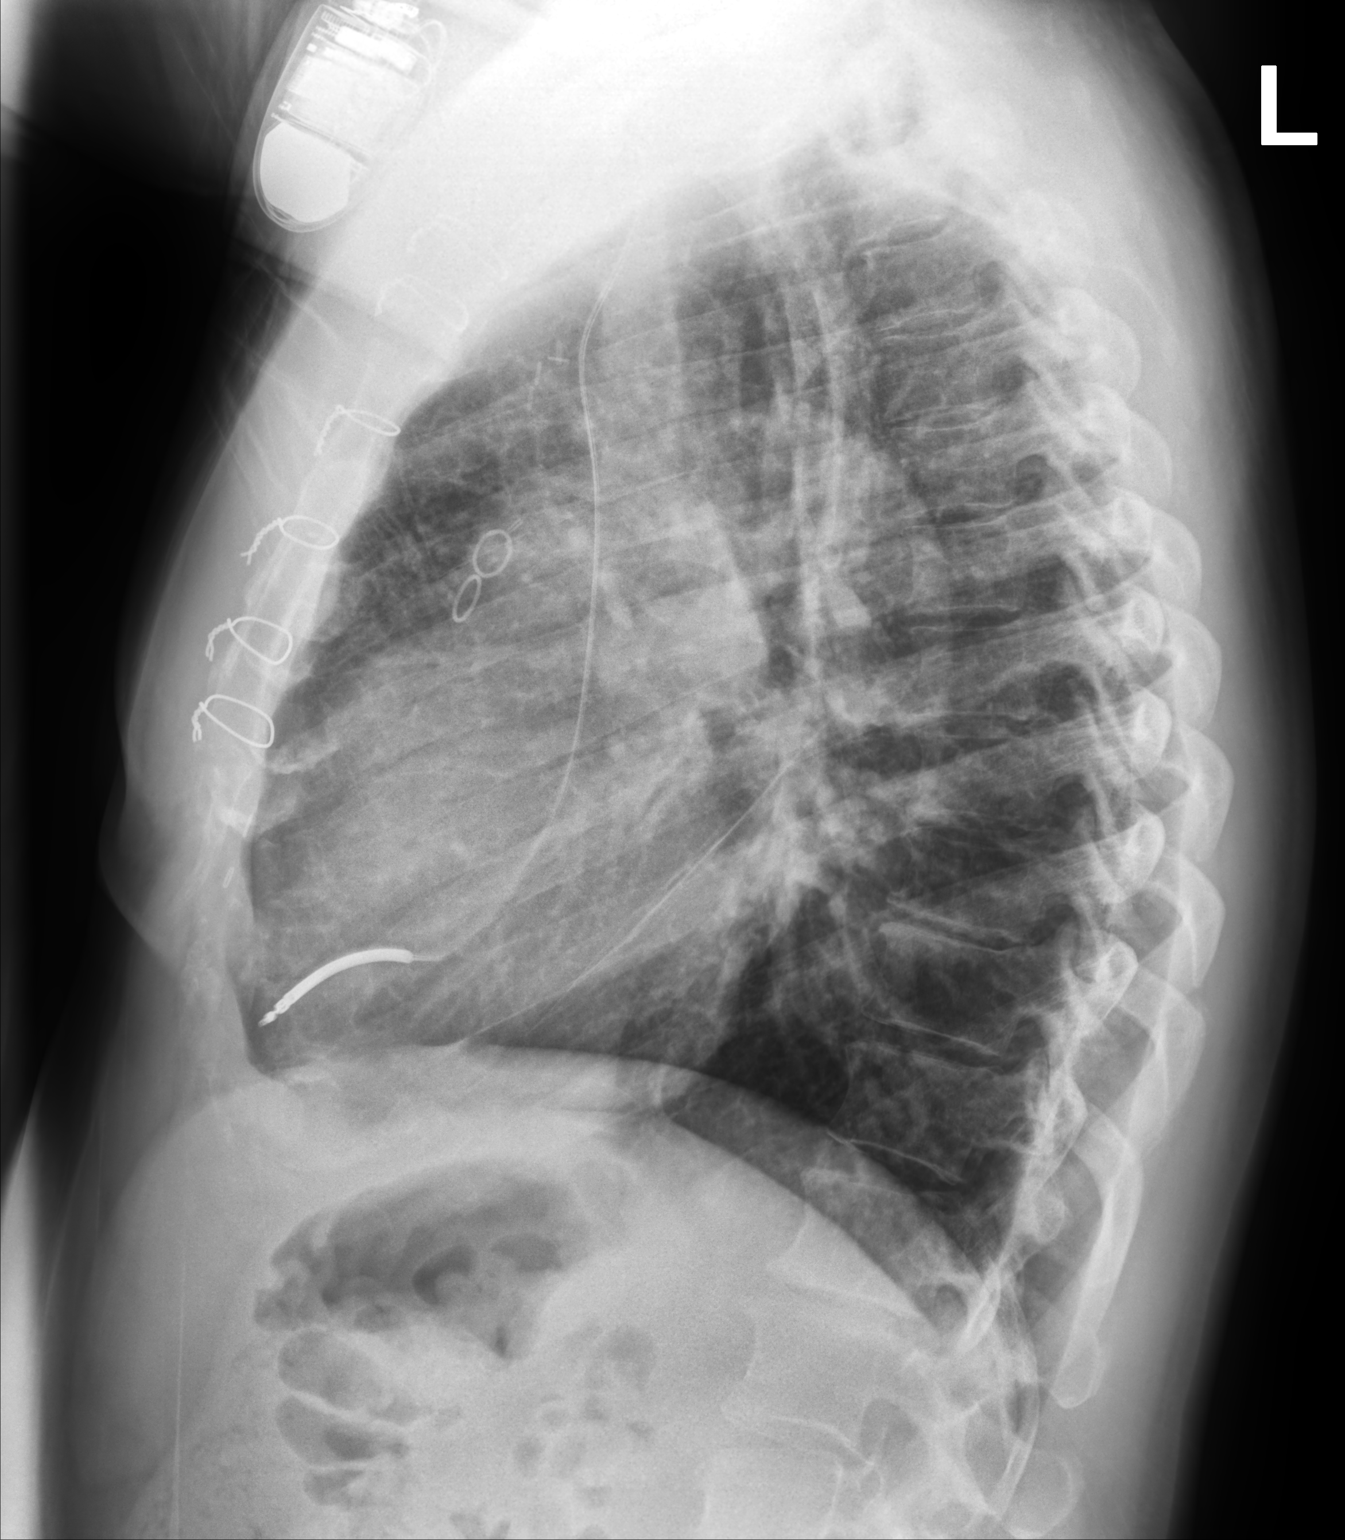

[2 of 2 positions shown; findings below may reference images not displayed]

FINDINGS: The left chest wall cardiac device and single lead are stable.
Median sternotomy wires and mediastinal surgical clips are stable.
The heart is enlarged, unchanged. The upper mediastinal contours are
normal.

The lungs are clear, with no focal consolidation or pulmonary edema.
There is no pleural effusion or pneumothorax.

There is no acute osseous abnormality.
IMPRESSION: Unchanged cardiomegaly. No radiographic evidence of acute
cardiopulmonary process.

## 2023-07-01 IMAGING — MG DIGITAL DIAGNOSTIC BILAT W/ TOMO W/ CAD
6 of 12 series · 6 of 36 positions shown · non-contrast
Comparison: None.

ACR Breast Density Category a: The breast tissue is almost entirely
fatty.

CLINICAL DATA: The patient presents with a tender lump in the right
periareolar region.

EXAM:
DIGITAL DIAGNOSTIC BILATERAL MAMMOGRAM WITH TOMOSYNTHESIS AND CAD
TECHNIQUE: Bilateral digital diagnostic mammography and breast tomosynthesis
was performed. The images were evaluated with computer-aided
detection.

[R CC synth-2D]
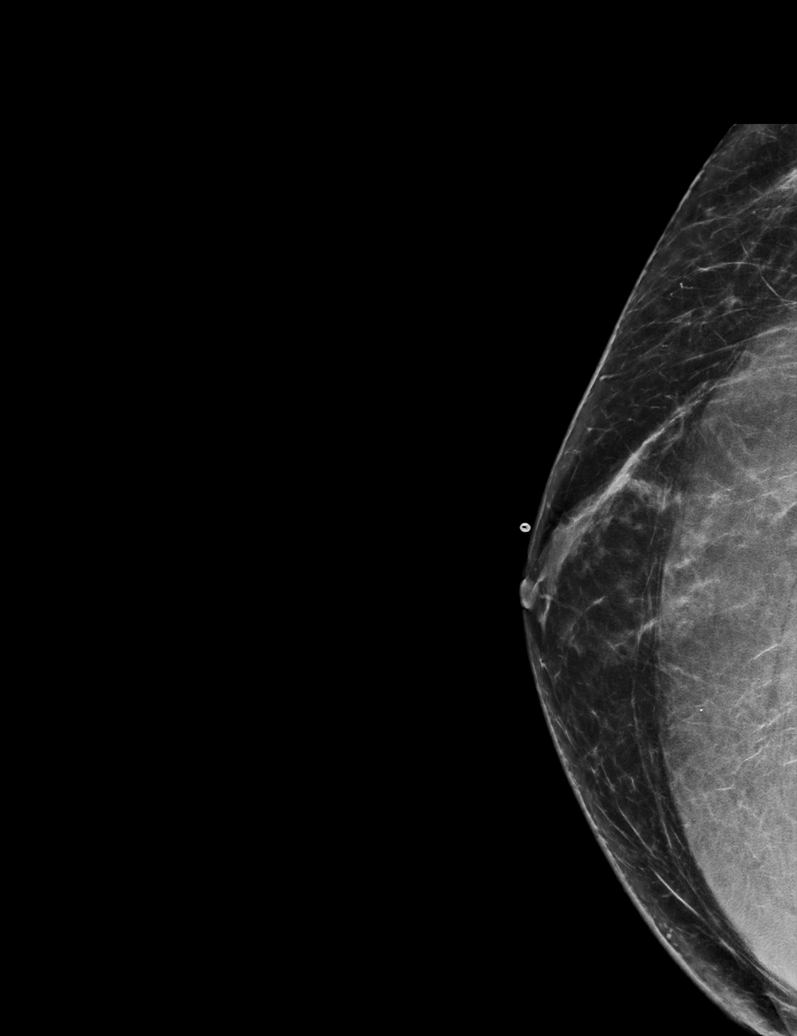

[L MLO synth-2D (1 of 2)]
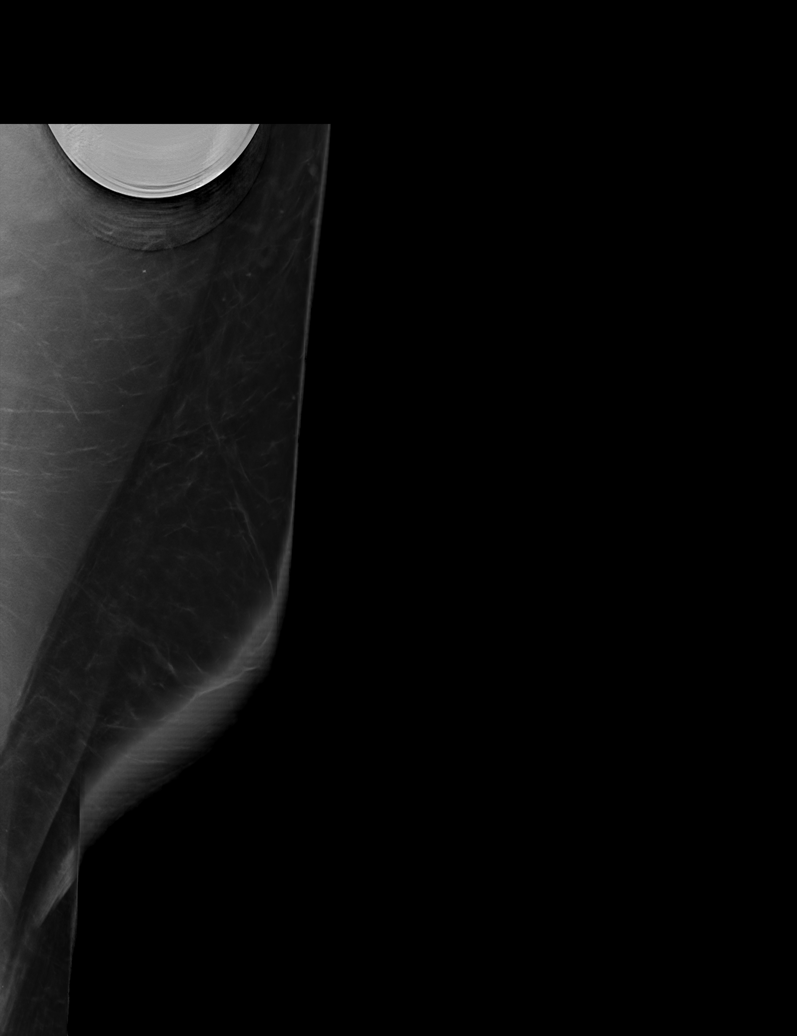

[R MLO synth-2D]
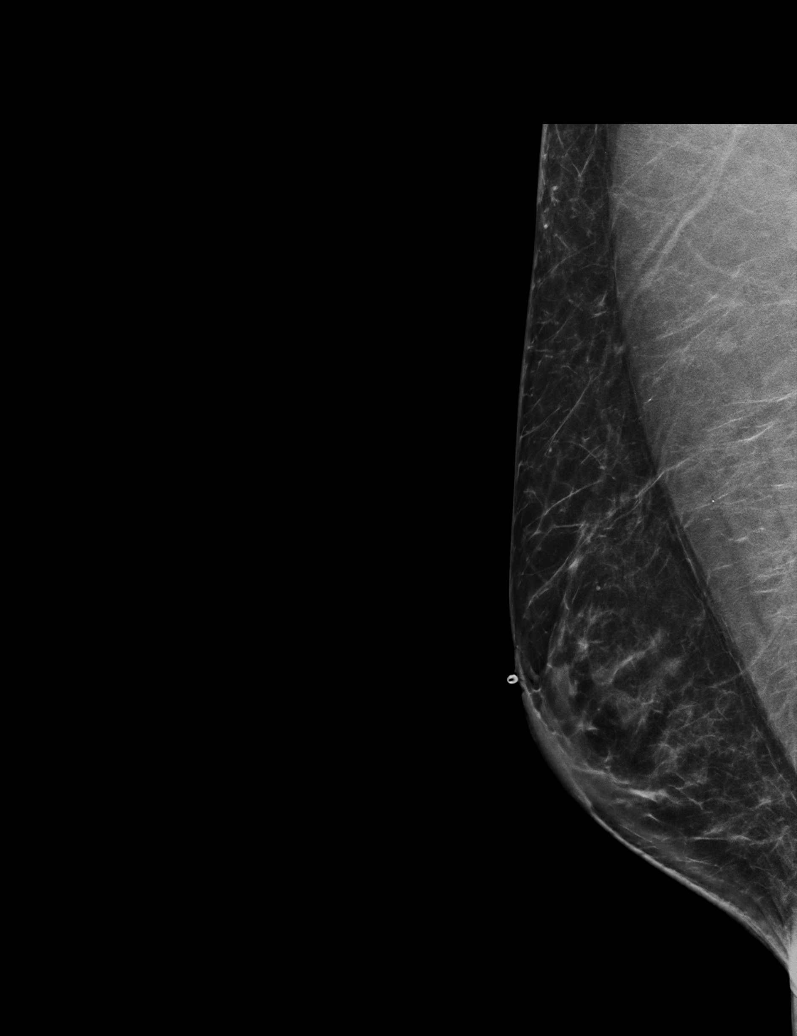

[L CC synth-2D]
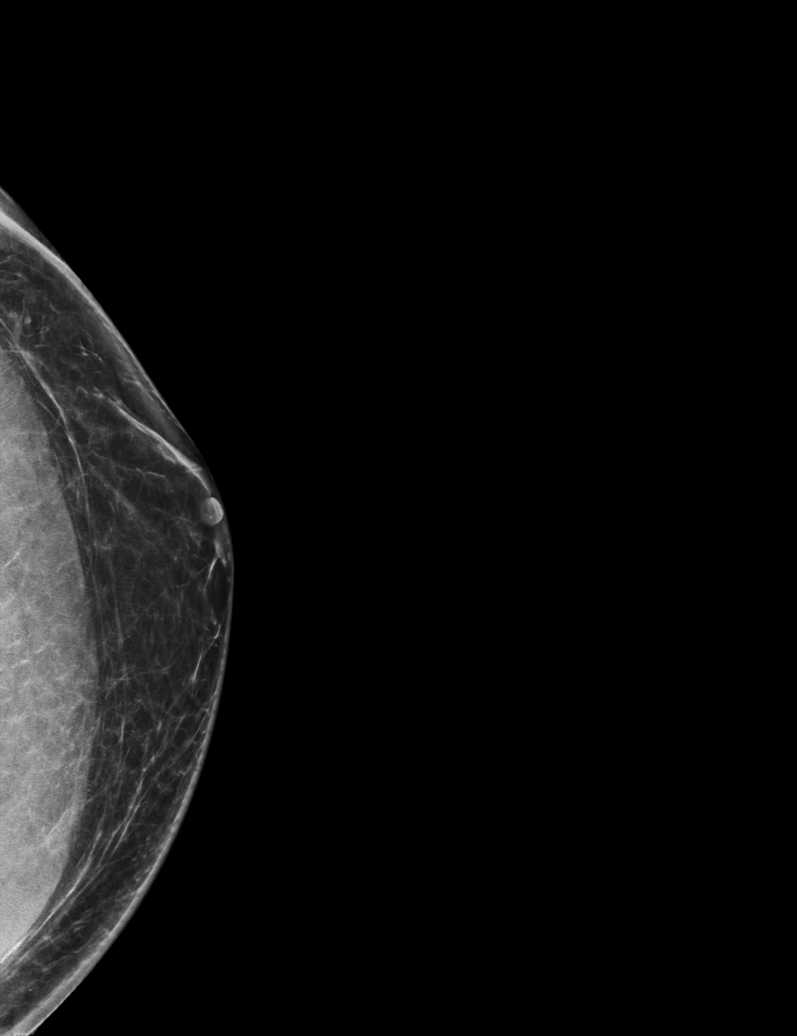

[L MLO synth-2D (2 of 2)]
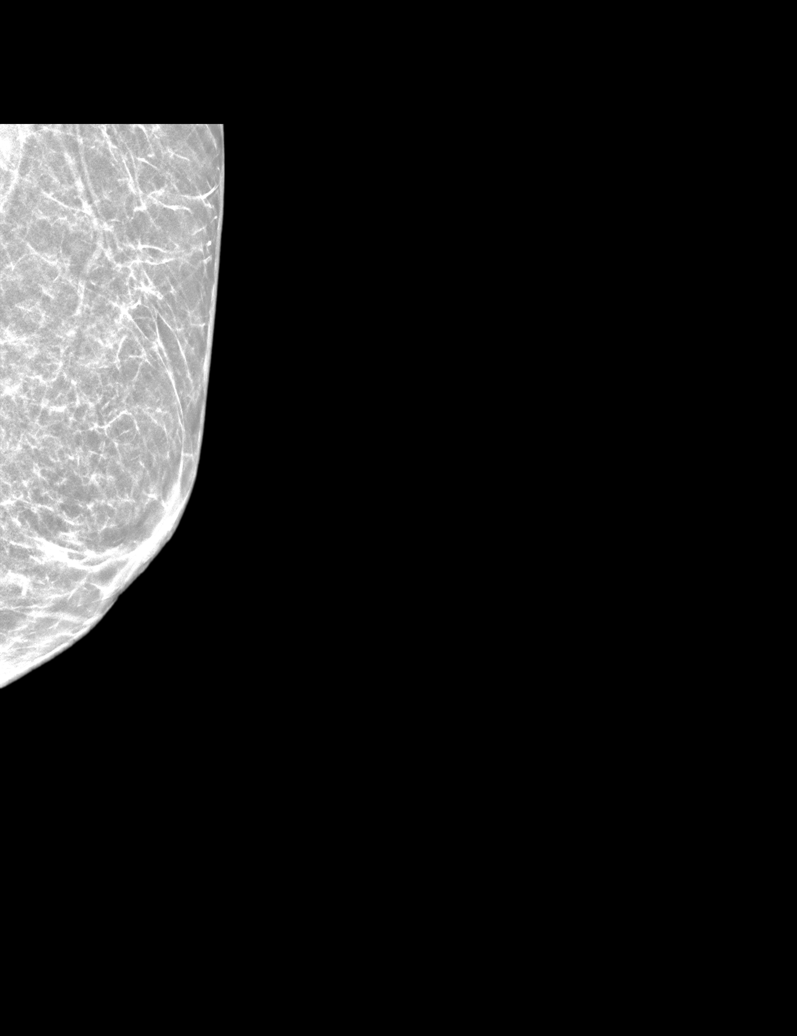

[R TAN synth-2D]
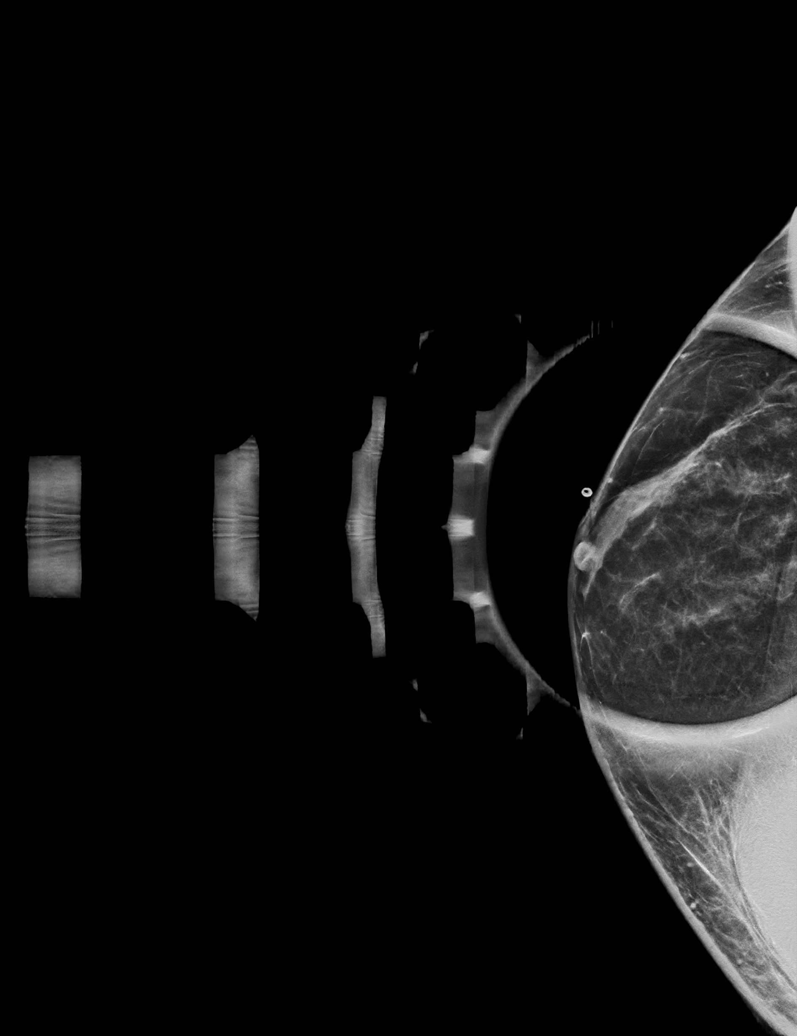

[6 of 36 positions shown; findings below may reference images not displayed]

FINDINGS: The patient has right-sided gynecomastia in the region of his lump.
No suspicious masses are identified in either breast.
IMPRESSION: Right-sided gynecomastia.  No evidence of malignancy.

RECOMMENDATION:
Treatment of the patient's gynecomastia should be based on clinical
exam.

I have discussed the findings and recommendations with the patient.
If applicable, a reminder letter will be sent to the patient
regarding the next appointment.

BI-RADS CATEGORY  2: Benign.

## 2023-07-08 DIAGNOSIS — Z48298 Encounter for aftercare following other organ transplant: Secondary | ICD-10-CM | POA: Diagnosis not present

## 2023-07-08 DIAGNOSIS — D849 Immunodeficiency, unspecified: Secondary | ICD-10-CM | POA: Diagnosis not present

## 2023-07-08 DIAGNOSIS — Z2989 Encounter for other specified prophylactic measures: Secondary | ICD-10-CM | POA: Diagnosis not present

## 2023-07-08 DIAGNOSIS — Z941 Heart transplant status: Secondary | ICD-10-CM | POA: Diagnosis not present

## 2023-07-08 DIAGNOSIS — Z79899 Other long term (current) drug therapy: Secondary | ICD-10-CM | POA: Diagnosis not present

## 2023-08-01 ENCOUNTER — Emergency Department (HOSPITAL_BASED_OUTPATIENT_CLINIC_OR_DEPARTMENT_OTHER)
Admission: EM | Admit: 2023-08-01 | Discharge: 2023-08-02 | Disposition: A | Discharge status: Home / Self Care | Payer: 59 | Attending: Emergency Medicine | Admitting: Emergency Medicine

## 2023-08-01 ENCOUNTER — Emergency Department (HOSPITAL_BASED_OUTPATIENT_CLINIC_OR_DEPARTMENT_OTHER): Payer: 59

## 2023-08-01 ENCOUNTER — Other Ambulatory Visit: Payer: Self-pay

## 2023-08-01 ENCOUNTER — Encounter (HOSPITAL_BASED_OUTPATIENT_CLINIC_OR_DEPARTMENT_OTHER): Payer: Self-pay

## 2023-08-01 DIAGNOSIS — J9811 Atelectasis: Secondary | ICD-10-CM | POA: Diagnosis not present

## 2023-08-01 DIAGNOSIS — Z794 Long term (current) use of insulin: Secondary | ICD-10-CM | POA: Insufficient documentation

## 2023-08-01 DIAGNOSIS — R918 Other nonspecific abnormal finding of lung field: Secondary | ICD-10-CM | POA: Diagnosis not present

## 2023-08-01 DIAGNOSIS — I2510 Atherosclerotic heart disease of native coronary artery without angina pectoris: Secondary | ICD-10-CM | POA: Diagnosis not present

## 2023-08-01 DIAGNOSIS — E119 Type 2 diabetes mellitus without complications: Secondary | ICD-10-CM | POA: Diagnosis not present

## 2023-08-01 DIAGNOSIS — F17200 Nicotine dependence, unspecified, uncomplicated: Secondary | ICD-10-CM | POA: Insufficient documentation

## 2023-08-01 DIAGNOSIS — J1282 Pneumonia due to coronavirus disease 2019: Secondary | ICD-10-CM | POA: Diagnosis not present

## 2023-08-01 DIAGNOSIS — R9431 Abnormal electrocardiogram [ECG] [EKG]: Secondary | ICD-10-CM | POA: Diagnosis not present

## 2023-08-01 DIAGNOSIS — R112 Nausea with vomiting, unspecified: Secondary | ICD-10-CM

## 2023-08-01 DIAGNOSIS — U071 COVID-19: Secondary | ICD-10-CM | POA: Insufficient documentation

## 2023-08-01 DIAGNOSIS — J189 Pneumonia, unspecified organism: Secondary | ICD-10-CM | POA: Diagnosis not present

## 2023-08-01 DIAGNOSIS — R197 Diarrhea, unspecified: Secondary | ICD-10-CM | POA: Diagnosis not present

## 2023-08-01 DIAGNOSIS — I509 Heart failure, unspecified: Secondary | ICD-10-CM | POA: Diagnosis not present

## 2023-08-01 DIAGNOSIS — Z95810 Presence of automatic (implantable) cardiac defibrillator: Secondary | ICD-10-CM | POA: Insufficient documentation

## 2023-08-01 DIAGNOSIS — N281 Cyst of kidney, acquired: Secondary | ICD-10-CM | POA: Diagnosis not present

## 2023-08-01 DIAGNOSIS — R109 Unspecified abdominal pain: Secondary | ICD-10-CM | POA: Diagnosis not present

## 2023-08-01 LAB — LACTIC ACID, PLASMA: Lactic Acid, Venous: 0.7 mmol/L (ref 0.5–1.9)

## 2023-08-01 LAB — COMPREHENSIVE METABOLIC PANEL
ALT: 22 U/L (ref 0–44)
AST: 22 U/L (ref 15–41)
Albumin: 4.4 g/dL (ref 3.5–5.0)
Alkaline Phosphatase: 46 U/L (ref 38–126)
Anion gap: 10 (ref 5–15)
BUN: 14 mg/dL (ref 6–20)
CO2: 24 mmol/L (ref 22–32)
Calcium: 9.6 mg/dL (ref 8.9–10.3)
Chloride: 103 mmol/L (ref 98–111)
Creatinine, Ser: 1.27 mg/dL — ABNORMAL HIGH (ref 0.61–1.24)
GFR, Estimated: >60 mL/min (ref >60)
Glucose, Bld: 108 mg/dL — ABNORMAL HIGH (ref 70–99)
Potassium: 3.7 mmol/L (ref 3.5–5.1)
Sodium: 137 mmol/L (ref 135–145)
Total Bilirubin: 0.6 mg/dL (ref 0.3–1.2)
Total Protein: 7 g/dL (ref 6.5–8.1)

## 2023-08-01 LAB — URINALYSIS, ROUTINE W REFLEX MICROSCOPIC
Bilirubin Urine: NEGATIVE
Glucose, UA: NEGATIVE mg/dL
Hgb urine dipstick: NEGATIVE
Ketones, ur: NEGATIVE mg/dL
Leukocytes,Ua: NEGATIVE
Nitrite: NEGATIVE
Protein, ur: NEGATIVE mg/dL
Specific Gravity, Urine: 1.013 (ref 1.005–1.030)
pH: 7.5 (ref 5.0–8.0)

## 2023-08-01 LAB — TROPONIN I (HIGH SENSITIVITY)
Troponin I (High Sensitivity): 3 ng/L (ref <18)
Troponin I (High Sensitivity): 14 ng/L (ref <18)

## 2023-08-01 LAB — RESP PANEL BY RT-PCR (RSV, FLU A&B, COVID)  RVPGX2
Influenza A by PCR: NEGATIVE
Influenza B by PCR: NEGATIVE
Resp Syncytial Virus by PCR: NEGATIVE
SARS Coronavirus 2 by RT PCR: POSITIVE — AB

## 2023-08-01 LAB — CBC
HCT: 43.5 % (ref 39.0–52.0)
Hemoglobin: 14 g/dL (ref 13.0–17.0)
MCH: 27.1 pg (ref 26.0–34.0)
MCHC: 32.2 g/dL (ref 30.0–36.0)
MCV: 84.1 fL (ref 80.0–100.0)
Platelets: 130 10*3/uL — ABNORMAL LOW (ref 150–400)
RBC: 5.17 MIL/uL (ref 4.22–5.81)
RDW: 13.9 % (ref 11.5–15.5)
WBC: 3.7 10*3/uL — ABNORMAL LOW (ref 4.0–10.5)
nRBC: 0 % (ref 0.0–0.2)

## 2023-08-01 LAB — BRAIN NATRIURETIC PEPTIDE: B Natriuretic Peptide: 75.1 pg/mL (ref 0.0–100.0)

## 2023-08-01 LAB — LIPASE, BLOOD: Lipase: 21 U/L (ref 11–51)

## 2023-08-01 LAB — MAGNESIUM: Magnesium: 1.6 mg/dL — ABNORMAL LOW (ref 1.7–2.4)

## 2023-08-01 MED ORDER — SODIUM CHLORIDE 0.9 % IV SOLN
1.0000 g | Freq: Once | INTRAVENOUS | Status: AC
  Administered 2023-08-01: 1 g via INTRAVENOUS
  Filled 2023-08-01: qty 10

## 2023-08-01 MED ORDER — MORPHINE SULFATE (PF) 4 MG/ML IV SOLN
4.0000 mg | Freq: Once | INTRAVENOUS | Status: AC
  Administered 2023-08-01: 4 mg via INTRAVENOUS
  Filled 2023-08-01: qty 1

## 2023-08-01 MED ORDER — MAGNESIUM SULFATE 2 GM/50ML IV SOLN
2.0000 g | Freq: Once | INTRAVENOUS | Status: AC
  Administered 2023-08-01: 2 g via INTRAVENOUS
  Filled 2023-08-01: qty 50

## 2023-08-01 MED ORDER — ONDANSETRON HCL 4 MG/2ML IJ SOLN
4.0000 mg | Freq: Once | INTRAMUSCULAR | Status: AC | PRN
  Administered 2023-08-01: 4 mg via INTRAVENOUS
  Filled 2023-08-01: qty 2

## 2023-08-01 MED ORDER — DICYCLOMINE HCL 10 MG/ML IM SOLN
20.0000 mg | Freq: Once | INTRAMUSCULAR | Status: AC
  Administered 2023-08-01: 20 mg via INTRAMUSCULAR
  Filled 2023-08-01: qty 2

## 2023-08-01 MED ORDER — IOHEXOL 300 MG/ML  SOLN
100.0000 mL | Freq: Once | INTRAMUSCULAR | Status: AC | PRN
  Administered 2023-08-01: 100 mL via INTRAVENOUS

## 2023-08-01 MED ORDER — SODIUM CHLORIDE 0.9 % IV SOLN
500.0000 mg | Freq: Once | INTRAVENOUS | Status: AC
  Administered 2023-08-01: 500 mg via INTRAVENOUS
  Filled 2023-08-01: qty 5

## 2023-08-01 MED ORDER — ONDANSETRON HCL 4 MG/2ML IJ SOLN
4.0000 mg | Freq: Once | INTRAMUSCULAR | Status: AC
  Administered 2023-08-01: 4 mg via INTRAVENOUS
  Filled 2023-08-01: qty 2

## 2023-08-01 MED ORDER — LACTATED RINGERS IV BOLUS
500.0000 mL | Freq: Once | INTRAVENOUS | Status: AC
  Administered 2023-08-01: 500 mL via INTRAVENOUS

## 2023-08-01 MED ORDER — PANTOPRAZOLE SODIUM 40 MG IV SOLR
40.0000 mg | Freq: Once | INTRAVENOUS | Status: AC
  Administered 2023-08-01: 40 mg via INTRAVENOUS
  Filled 2023-08-01: qty 10

## 2023-08-01 NOTE — ED Provider Notes (Signed)
[place] EMERGENCY DEPARTMENT AT [facility] Provider Note   CSN: [id number] Arrival date & time: 08/01/23  1508     History {Add pertinent medical, surgical, social history, OB history to HPI:1} Chief Complaint  Patient presents with   Abdominal Pain    Bruce Robertson is a 62 y.o. male.  HPI      62 year old male with a history of heart transplant with Dr. Florian Buff at [facility] August 31, 2022 for nonischemic cardiomyopathy, DM, AICD, dyslipidemia   2 weeks having diarrhea in the morning mostly, improves in the afternoon, 3-4 times in the morning, 2 later in day, about 6 times per day. No black or bloody stools.   Nausea and vomiting 10 times a day.  Not taking anything for it.   No new medications. Abdominal pain in pit of stomach.  Comes and goes, before vomiting.  No known fever Shortntess of breath for 2 weeks, but more like fatigue.  Little sharp pain in chest every now and then.  Left side, sporadic once or twice a day.  Nothing makes it better or worse. Have felt lightheaded but not now. Has not yet talked to drs about it. No known sick contacts, no recent travel On chronic abx   Congestion, cough for about one month.   Had previously transition from mychophenolate mofetil caused diarrhea/n/v to myfortic , on tacro, FK.  Prednisone 5mg , prophylactic bactrim  Past Medical History:  Diagnosis Date   AICD (automatic cardioverter/defibrillator) present    Anxiety 08/20/2016   Barrett's esophagus 2002   Cardiomyopathy    Nonischemic. EF has been about 45%. S/P CABG.;  b.  Echo 4/14: EF 25%, global HK with inf and mid apical AK, restrictive physiology with E/e' > 15 (elevated LV filling pressure), trivial AI/MR, mild to mod LAE, mild RVE, mild reduced RVSF, mild RAE, mod TR, PASP 74 (severe pulmonary HTN)    CHF (congestive heart failure) (HCC)    CHF exacerbation (HCC) 07/31/2016   Colon polyps 2002   Coronary artery disease    Diabetes mellitus without  complication (HCC)    Type II   Dyslipidemia    hx (03/20/2017)   GERD (gastroesophageal reflux disease)    History of bleeding peptic ulcer    History of echocardiogram    Echo 10/16:  EF 25-30%, poss non-compaction, diff HK with inf-lat and apical HK, restrictive physio, mild AI, severe LAE, mild RVE with mild reduced RVSF, PASP 42 mmHg   Hyperplastic rectal polyp    Moderate episode of recurrent major depressive disorder (HCC) 08/20/2016   09/14/20 - not current   OSA on CPAP    Mild   Pneumonia 09/2018   Tobacco abuse    Remote    Home Medications Prior to Admission medications   Medication Sig Start Date End Date Taking? Authorizing Provider  ACCU-CHEK GUIDE test strip Use as directed-patient needs an appt 08/12/22   Karie Georges, MD  Accu-Chek Softclix Lancets lancets Use as directed 08/13/22   Karie Georges, MD  albuterol (VENTOLIN HFA) 108 (90 Base) MCG/ACT inhaler Inhale 2 puffs into the lungs every 6 (six) hours as needed for wheezing or shortness of breath. 08/16/22   Karie Georges, MD  amLODipine (NORVASC) 5 MG tablet Take 5 mg by mouth daily.    [provider]  blood glucose meter kit and supplies KIT Dispense based on patient and insurance preference. Use up to four times daily as directed. 02/14/20   Theodis Shove  C, MD  digoxin (LANOXIN) 0.125 MG tablet Take 1 tablet (0.125 mg total) by mouth daily. 04/30/22   Clegg, Amy D, NP  doxepin (SINEQUAN) 10 MG capsule Take 1 capsule (10 mg total) by mouth at bedtime. 10/29/22   Karie Georges, MD  HYDROcodone-acetaminophen (NORCO/VICODIN) 5-325 MG tablet Take 1 tablet by mouth every 6 (six) hours as needed. 02/26/23   Linwood Dibbles, MD  insulin lispro (HUMALOG) 100 UNIT/ML injection Inject 4 Units into the skin 3 (three) times daily before meals.    [provider]  mycophenolate (MYFORTIC) 360 MG TBEC EC tablet Take 720 mg by mouth 2 (two) times daily. 10/12/22   [provider]   NYSTATIN PO Take 5 mLs by mouth in the morning, at noon, in the evening, and at bedtime.    [provider]  omeprazole (PRILOSEC) 40 MG capsule Take 1 capsule (40 mg total) by mouth daily. 04/29/22   Clegg, Amy D, NP  ondansetron (ZOFRAN-ODT) 8 MG disintegrating tablet Take 1 tablet (8 mg total) by mouth every 8 (eight) hours as needed for nausea or vomiting. 02/26/23   Linwood Dibbles, MD  pantoprazole (PROTONIX) 40 MG tablet Take 1 tablet (40 mg total) by mouth daily. 02/26/23   Linwood Dibbles, MD  pravastatin (PRAVACHOL) 40 MG tablet Take 40 mg by mouth at bedtime.    [provider]  predniSONE (DELTASONE) 5 MG tablet Take 5 mg by mouth daily with breakfast. 10/08/22 10/08/23  [provider]  sildenafil (VIAGRA) 50 MG tablet Take 1 tablet (50 mg total) by mouth daily as needed for erectile dysfunction. 11/15/22   Karie Georges, MD  sucralfate (CARAFATE) 1 g tablet Take 1 tablet (1 g total) by mouth 4 (four) times daily -  with meals and at bedtime. 02/26/23   Linwood Dibbles, MD  sulfamethoxazole-trimethoprim (BACTRIM DS) 800-160 MG tablet Take 1 tablet by mouth once.    [provider]  Suvorexant (BELSOMRA) 10 MG TABS Take 10 mg by mouth at bedtime. 10/16/22   Karie Georges, MD  tacrolimus (PROGRAF) 1 MG capsule Take 10 mg by mouth 2 (two) times daily. 10/15/22 10/15/23  [provider]  valACYclovir (VALTREX) 500 MG tablet Take 500 mg by mouth 2 (two) times daily.    [provider]      Allergies    Patient has no known allergies.    Review of Systems   Review of Systems  Physical Exam Updated Vital Signs BP (!) 128/93 (BP Location: Right Arm)   Pulse 95   Temp 98.9 F (37.2 C) (Oral)   Resp 20   Ht 6' (1.829 m)   Wt 93.9 kg   SpO2 98%   BMI 28.07 kg/m  Physical Exam  ED Results / Procedures / Treatments   Labs (all labs ordered are listed, but only abnormal results are displayed) Labs Reviewed  LIPASE, BLOOD  COMPREHENSIVE  METABOLIC PANEL  CBC  URINALYSIS, ROUTINE W REFLEX MICROSCOPIC    EKG None  Radiology No results found.  Procedures Procedures  {Document cardiac monitor, telemetry assessment procedure when appropriate:1}  Medications Ordered in ED Medications  ondansetron (ZOFRAN) injection 4 mg (has no administration in time range)    ED Course/ Medical Decision Making/ A&P   {   Click here for ABCD2, HEART and other calculatorsREFRESH Note before signing :1}  Medical Decision Making Amount and/or Complexity of Data Reviewed Labs: ordered. Radiology: ordered.  Risk Prescription drug management.   *** Reactivated CMV   Dr. Lanae Boast Heart Transplant Service  Gancyclovir-- {Document critical care time when appropriate:1} {Document review of labs and clinical decision tools ie heart score, Chads2Vasc2 etc:1}  {Document your independent review of radiology images, and any outside records:1} {Document your discussion with family members, caretakers, and with consultants:1} {Document social determinants of health affecting pt's care:1} {Document your decision making why or why not admission, treatments were needed:1} Final Clinical Impression(s) / ED Diagnoses Final diagnoses:  None    Rx / DC Orders ED Discharge Orders     None

## 2023-08-01 NOTE — ED Triage Notes (Signed)
Pt presents with abd pain with associated N/V/D x 2 week.

## 2023-08-01 NOTE — ED Notes (Signed)
Dr Allena Katz paged with [facility]- to c/b [phone]

## 2023-08-02 DIAGNOSIS — Z95810 Presence of automatic (implantable) cardiac defibrillator: Secondary | ICD-10-CM | POA: Diagnosis not present

## 2023-08-02 DIAGNOSIS — R109 Unspecified abdominal pain: Secondary | ICD-10-CM | POA: Diagnosis not present

## 2023-08-02 DIAGNOSIS — E1122 Type 2 diabetes mellitus with diabetic chronic kidney disease: Secondary | ICD-10-CM | POA: Diagnosis not present

## 2023-08-02 DIAGNOSIS — Z794 Long term (current) use of insulin: Secondary | ICD-10-CM | POA: Diagnosis not present

## 2023-08-02 DIAGNOSIS — Z79624 Long term (current) use of inhibitors of nucleotide synthesis: Secondary | ICD-10-CM | POA: Diagnosis not present

## 2023-08-02 DIAGNOSIS — E785 Hyperlipidemia, unspecified: Secondary | ICD-10-CM | POA: Diagnosis not present

## 2023-08-02 DIAGNOSIS — Z87891 Personal history of nicotine dependence: Secondary | ICD-10-CM | POA: Diagnosis not present

## 2023-08-02 DIAGNOSIS — I129 Hypertensive chronic kidney disease with stage 1 through stage 4 chronic kidney disease, or unspecified chronic kidney disease: Secondary | ICD-10-CM | POA: Diagnosis not present

## 2023-08-02 DIAGNOSIS — I2510 Atherosclerotic heart disease of native coronary artery without angina pectoris: Secondary | ICD-10-CM | POA: Diagnosis not present

## 2023-08-02 DIAGNOSIS — U071 COVID-19: Secondary | ICD-10-CM | POA: Diagnosis not present

## 2023-08-02 DIAGNOSIS — Z79899 Other long term (current) drug therapy: Secondary | ICD-10-CM | POA: Diagnosis not present

## 2023-08-02 DIAGNOSIS — I272 Pulmonary hypertension, unspecified: Secondary | ICD-10-CM | POA: Diagnosis not present

## 2023-08-02 DIAGNOSIS — G4733 Obstructive sleep apnea (adult) (pediatric): Secondary | ICD-10-CM | POA: Diagnosis not present

## 2023-08-02 DIAGNOSIS — J189 Pneumonia, unspecified organism: Secondary | ICD-10-CM | POA: Diagnosis not present

## 2023-08-02 DIAGNOSIS — N1831 Chronic kidney disease, stage 3a: Secondary | ICD-10-CM | POA: Diagnosis not present

## 2023-08-02 DIAGNOSIS — F17200 Nicotine dependence, unspecified, uncomplicated: Secondary | ICD-10-CM | POA: Diagnosis not present

## 2023-08-02 DIAGNOSIS — J9811 Atelectasis: Secondary | ICD-10-CM | POA: Diagnosis not present

## 2023-08-02 DIAGNOSIS — Z941 Heart transplant status: Secondary | ICD-10-CM | POA: Diagnosis not present

## 2023-08-02 DIAGNOSIS — I509 Heart failure, unspecified: Secondary | ICD-10-CM | POA: Diagnosis not present

## 2023-08-02 DIAGNOSIS — D84821 Immunodeficiency due to drugs: Secondary | ICD-10-CM | POA: Diagnosis not present

## 2023-08-02 DIAGNOSIS — Z8711 Personal history of peptic ulcer disease: Secondary | ICD-10-CM | POA: Diagnosis not present

## 2023-08-02 DIAGNOSIS — J449 Chronic obstructive pulmonary disease, unspecified: Secondary | ICD-10-CM | POA: Diagnosis not present

## 2023-08-02 DIAGNOSIS — K219 Gastro-esophageal reflux disease without esophagitis: Secondary | ICD-10-CM | POA: Diagnosis not present

## 2023-08-02 DIAGNOSIS — G4700 Insomnia, unspecified: Secondary | ICD-10-CM | POA: Diagnosis not present

## 2023-08-02 DIAGNOSIS — R197 Diarrhea, unspecified: Secondary | ICD-10-CM | POA: Diagnosis present

## 2023-08-02 DIAGNOSIS — E119 Type 2 diabetes mellitus without complications: Secondary | ICD-10-CM | POA: Diagnosis not present

## 2023-08-03 DIAGNOSIS — U071 COVID-19: Secondary | ICD-10-CM | POA: Diagnosis not present

## 2023-08-03 DIAGNOSIS — Z941 Heart transplant status: Secondary | ICD-10-CM | POA: Diagnosis not present

## 2023-08-04 LAB — TACROLIMUS LEVEL: Tacrolimus (FK506) - LabCorp: 6.6 ng/mL (ref 2.0–20.0)

## 2023-08-07 LAB — CULTURE, BLOOD (ROUTINE X 2)
Culture: NO GROWTH
Culture: NO GROWTH
Special Requests: ADEQUATE
Special Requests: ADEQUATE

## 2023-08-08 ENCOUNTER — Telehealth: Payer: Self-pay

## 2023-08-08 NOTE — Progress Notes (Signed)
08/08/2023  Patient ID: Bruce Robertson, male   DOB: 1961/03/24, 62 y.o.   MRN: [id number]  Attempted to contact patient to schedule a follow up appt and discuss details in regards to renewing his CGM order. Left voicemail.  Sherrill Raring, PharmD Clinical Pharmacist [phone]

## 2023-08-16 ENCOUNTER — Emergency Department (HOSPITAL_BASED_OUTPATIENT_CLINIC_OR_DEPARTMENT_OTHER): Payer: 59

## 2023-08-16 ENCOUNTER — Emergency Department (HOSPITAL_BASED_OUTPATIENT_CLINIC_OR_DEPARTMENT_OTHER)
Admission: EM | Admit: 2023-08-16 | Discharge: 2023-08-16 | Disposition: A | Discharge status: Home / Self Care | Payer: 59 | Attending: Emergency Medicine | Admitting: Emergency Medicine

## 2023-08-16 ENCOUNTER — Other Ambulatory Visit: Payer: Self-pay

## 2023-08-16 ENCOUNTER — Encounter (HOSPITAL_BASED_OUTPATIENT_CLINIC_OR_DEPARTMENT_OTHER): Payer: Self-pay

## 2023-08-16 DIAGNOSIS — N189 Chronic kidney disease, unspecified: Secondary | ICD-10-CM | POA: Insufficient documentation

## 2023-08-16 DIAGNOSIS — I509 Heart failure, unspecified: Secondary | ICD-10-CM | POA: Diagnosis not present

## 2023-08-16 DIAGNOSIS — R000 Tachycardia, unspecified: Secondary | ICD-10-CM | POA: Diagnosis not present

## 2023-08-16 DIAGNOSIS — Z794 Long term (current) use of insulin: Secondary | ICD-10-CM | POA: Insufficient documentation

## 2023-08-16 DIAGNOSIS — Z79899 Other long term (current) drug therapy: Secondary | ICD-10-CM | POA: Diagnosis not present

## 2023-08-16 DIAGNOSIS — R109 Unspecified abdominal pain: Secondary | ICD-10-CM | POA: Diagnosis not present

## 2023-08-16 DIAGNOSIS — R197 Diarrhea, unspecified: Secondary | ICD-10-CM | POA: Insufficient documentation

## 2023-08-16 DIAGNOSIS — E1122 Type 2 diabetes mellitus with diabetic chronic kidney disease: Secondary | ICD-10-CM | POA: Diagnosis not present

## 2023-08-16 DIAGNOSIS — Z951 Presence of aortocoronary bypass graft: Secondary | ICD-10-CM | POA: Diagnosis not present

## 2023-08-16 DIAGNOSIS — R112 Nausea with vomiting, unspecified: Secondary | ICD-10-CM | POA: Diagnosis not present

## 2023-08-16 DIAGNOSIS — R1084 Generalized abdominal pain: Secondary | ICD-10-CM | POA: Insufficient documentation

## 2023-08-16 DIAGNOSIS — K449 Diaphragmatic hernia without obstruction or gangrene: Secondary | ICD-10-CM | POA: Diagnosis not present

## 2023-08-16 DIAGNOSIS — R9389 Abnormal findings on diagnostic imaging of other specified body structures: Secondary | ICD-10-CM | POA: Diagnosis not present

## 2023-08-16 LAB — URINALYSIS, ROUTINE W REFLEX MICROSCOPIC
Bacteria, UA: NONE SEEN
Bilirubin Urine: NEGATIVE
Glucose, UA: NEGATIVE mg/dL
Hgb urine dipstick: NEGATIVE
Ketones, ur: NEGATIVE mg/dL
Leukocytes,Ua: NEGATIVE
Nitrite: NEGATIVE
Protein, ur: 100 mg/dL — AB
Specific Gravity, Urine: 1.018 (ref 1.005–1.030)
pH: 7.5 (ref 5.0–8.0)

## 2023-08-16 LAB — COMPREHENSIVE METABOLIC PANEL
ALT: 22 U/L (ref 0–44)
AST: 23 U/L (ref 15–41)
Albumin: 5.1 g/dL — ABNORMAL HIGH (ref 3.5–5.0)
Alkaline Phosphatase: 54 U/L (ref 38–126)
Anion gap: 13 (ref 5–15)
BUN: 31 mg/dL — ABNORMAL HIGH (ref 6–20)
CO2: 25 mmol/L (ref 22–32)
Calcium: 11 mg/dL — ABNORMAL HIGH (ref 8.9–10.3)
Chloride: 98 mmol/L (ref 98–111)
Creatinine, Ser: 1.74 mg/dL — ABNORMAL HIGH (ref 0.61–1.24)
GFR, Estimated: 44 mL/min — ABNORMAL LOW (ref >60)
Glucose, Bld: 144 mg/dL — ABNORMAL HIGH (ref 70–99)
Potassium: 4.5 mmol/L (ref 3.5–5.1)
Sodium: 136 mmol/L (ref 135–145)
Total Bilirubin: 1 mg/dL (ref 0.3–1.2)
Total Protein: 8.2 g/dL — ABNORMAL HIGH (ref 6.5–8.1)

## 2023-08-16 LAB — TROPONIN I (HIGH SENSITIVITY)
Troponin I (High Sensitivity): 8 ng/L (ref <18)
Troponin I (High Sensitivity): 10 ng/L (ref <18)

## 2023-08-16 LAB — CBC
HCT: 49.7 % (ref 39.0–52.0)
Hemoglobin: 16.2 g/dL (ref 13.0–17.0)
MCH: 26.9 pg (ref 26.0–34.0)
MCHC: 32.6 g/dL (ref 30.0–36.0)
MCV: 82.4 fL (ref 80.0–100.0)
Platelets: 210 10*3/uL (ref 150–400)
RBC: 6.03 MIL/uL — ABNORMAL HIGH (ref 4.22–5.81)
RDW: 13.9 % (ref 11.5–15.5)
WBC: 7.9 10*3/uL (ref 4.0–10.5)
nRBC: 0 % (ref 0.0–0.2)

## 2023-08-16 LAB — RAPID URINE DRUG SCREEN, HOSP PERFORMED
Amphetamines: NOT DETECTED
Barbiturates: NOT DETECTED
Benzodiazepines: NOT DETECTED
Cocaine: NOT DETECTED
Opiates: NOT DETECTED
Tetrahydrocannabinol: POSITIVE — AB

## 2023-08-16 LAB — LIPASE, BLOOD: Lipase: 25 U/L (ref 11–51)

## 2023-08-16 MED ORDER — IOHEXOL 300 MG/ML  SOLN
100.0000 mL | Freq: Once | INTRAMUSCULAR | Status: AC | PRN
  Administered 2023-08-16: 100 mL via INTRAVENOUS

## 2023-08-16 MED ORDER — ONDANSETRON HCL 4 MG/2ML IJ SOLN
4.0000 mg | Freq: Once | INTRAMUSCULAR | Status: AC
  Administered 2023-08-16: 4 mg via INTRAVENOUS
  Filled 2023-08-16: qty 2

## 2023-08-16 MED ORDER — FENTANYL CITRATE PF 50 MCG/ML IJ SOSY
25.0000 ug | PREFILLED_SYRINGE | Freq: Once | INTRAMUSCULAR | Status: AC
  Administered 2023-08-16: 25 ug via INTRAVENOUS
  Filled 2023-08-16: qty 1

## 2023-08-16 MED ORDER — LACTATED RINGERS IV BOLUS
1000.0000 mL | Freq: Once | INTRAVENOUS | Status: AC
  Administered 2023-08-16: 1000 mL via INTRAVENOUS

## 2023-08-16 MED ORDER — PROCHLORPERAZINE EDISYLATE 10 MG/2ML IJ SOLN
10.0000 mg | Freq: Once | INTRAMUSCULAR | Status: AC
  Administered 2023-08-16: 10 mg via INTRAVENOUS
  Filled 2023-08-16: qty 2

## 2023-08-16 NOTE — Discharge Instructions (Signed)
You were seen in the emergency department today for abdominal pain, nausea, vomiting and diarrhea.  Your labs imaging were thankfully reassuring without any acute findings to explain her current symptoms.  I reached out to your heart transplant team at [facility] and they recommended to follow-up with her service once you are discharged home.  If you have any decline or worsening symptoms, return to the emergency department.

## 2023-08-16 NOTE — ED Triage Notes (Signed)
Patient presents with generalized abd pain with associated N/V/D that started 3 days ago.

## 2023-08-16 NOTE — ED Notes (Signed)
Fluids provided to patient for PO fluid challenge.

## 2023-08-16 NOTE — ED Provider Notes (Signed)
 EMERGENCY DEPARTMENT AT [facility] Provider Note   CSN: [id number] Arrival date & time: 08/16/23  4098     History Chief Complaint  Patient presents with   Abdominal Pain    Bruce Robertson is a 62 y.o. male.  Patient with past history significant for chronic kidney disease, hyperlipidemia, cardiomyopathy, congestive heart failure, CABG, and type 2 diabetes who presents emergency department concerns of abdominal pain, nausea, vomiting.  States has been ongoing for the last week or so without improvement in symptoms.  States that he has had some specks of blood in his vomit but denies any bright red bleeding in vomit or per rectum.   Abdominal Pain      Home Medications Prior to Admission medications   Medication Sig Start Date End Date Taking? Authorizing Provider  ACCU-CHEK GUIDE test strip Use as directed-patient needs an appt 08/12/22   Karie Georges, MD  Accu-Chek Softclix Lancets lancets Use as directed 08/13/22   Karie Georges, MD  albuterol (VENTOLIN HFA) 108 (90 Base) MCG/ACT inhaler Inhale 2 puffs into the lungs every 6 (six) hours as needed for wheezing or shortness of breath. 08/16/22   Karie Georges, MD  amLODipine (NORVASC) 5 MG tablet Take 5 mg by mouth daily.    [provider]  blood glucose meter kit and supplies KIT Dispense based on patient and insurance preference. Use up to four times daily as directed. 02/14/20   Wynn Banker, MD  digoxin (LANOXIN) 0.125 MG tablet Take 1 tablet (0.125 mg total) by mouth daily. 04/30/22   Clegg, Amy D, NP  doxepin (SINEQUAN) 10 MG capsule Take 1 capsule (10 mg total) by mouth at bedtime. 10/29/22   Karie Georges, MD  HYDROcodone-acetaminophen (NORCO/VICODIN) 5-325 MG tablet Take 1 tablet by mouth every 6 (six) hours as needed. 02/26/23   Linwood Dibbles, MD  insulin lispro (HUMALOG) 100 UNIT/ML injection Inject 4 Units into the skin 3 (three) times daily before meals.    [provider]  mycophenolate (MYFORTIC) 360 MG TBEC EC tablet Take 720 mg by mouth 2 (two) times daily. 10/12/22   [provider]  NYSTATIN PO Take 5 mLs by mouth in the morning, at noon, in the evening, and at bedtime.    [provider]  omeprazole (PRILOSEC) 40 MG capsule Take 1 capsule (40 mg total) by mouth daily. 04/29/22   Clegg, Amy D, NP  ondansetron (ZOFRAN-ODT) 8 MG disintegrating tablet Take 1 tablet (8 mg total) by mouth every 8 (eight) hours as needed for nausea or vomiting. 02/26/23   Linwood Dibbles, MD  pantoprazole (PROTONIX) 40 MG tablet Take 1 tablet (40 mg total) by mouth daily. 02/26/23   Linwood Dibbles, MD  pravastatin (PRAVACHOL) 40 MG tablet Take 40 mg by mouth at bedtime.    [provider]  predniSONE (DELTASONE) 5 MG tablet Take 5 mg by mouth daily with breakfast. 10/08/22 10/08/23  [provider]  sildenafil (VIAGRA) 50 MG tablet Take 1 tablet (50 mg total) by mouth daily as needed for erectile dysfunction. 11/15/22   Karie Georges, MD  sucralfate (CARAFATE) 1 g tablet Take 1 tablet (1 g total) by mouth 4 (four) times daily -  with meals and at bedtime. 02/26/23   Linwood Dibbles, MD  sulfamethoxazole-trimethoprim (BACTRIM DS) 800-160 MG tablet Take 1 tablet by mouth once.    [provider]  Suvorexant (BELSOMRA) 10 MG TABS Take 10 mg by mouth at bedtime.  10/16/22   Karie Georges, MD  tacrolimus (PROGRAF) 1 MG capsule Take 10 mg by mouth 2 (two) times daily. 10/15/22 10/15/23  [provider]  valACYclovir (VALTREX) 500 MG tablet Take 500 mg by mouth 2 (two) times daily.    [provider]      Allergies    Patient has no known allergies.    Review of Systems   Review of Systems  Gastrointestinal:  Positive for abdominal pain.  All other systems reviewed and are negative.   Physical Exam Updated Vital Signs BP (!) 141/102 (BP Location: Right Arm)   Pulse (!) 108   Temp 98.9 F (37.2 C) (Oral)   Resp 18    SpO2 98%  Physical Exam Vitals and nursing note reviewed.  Constitutional:      General: He is not in acute distress.    Appearance: He is well-developed.  HENT:     Head: Normocephalic and atraumatic.  Eyes:     Conjunctiva/sclera: Conjunctivae normal.  Cardiovascular:     Rate and Rhythm: Normal rate and regular rhythm.     Heart sounds: No murmur heard. Pulmonary:     Effort: Pulmonary effort is normal. No respiratory distress.     Breath sounds: Normal breath sounds.  Abdominal:     Palpations: Abdomen is soft.     Tenderness: There is generalized abdominal tenderness. There is no right CVA tenderness or left CVA tenderness.  Musculoskeletal:        General: No swelling.     Cervical back: Neck supple.  Skin:    General: Skin is warm and dry.     Capillary Refill: Capillary refill takes less than 2 seconds.  Neurological:     Mental Status: He is alert.  Psychiatric:        Mood and Affect: Mood normal.     ED Results / Procedures / Treatments   Labs (all labs ordered are listed, but only abnormal results are displayed) Labs Reviewed  CBC - Abnormal; Notable for the following components:      Result Value   RBC 6.03 (*)    All other components within normal limits  LIPASE, BLOOD  COMPREHENSIVE METABOLIC PANEL  URINALYSIS, ROUTINE W REFLEX MICROSCOPIC  RAPID URINE DRUG SCREEN, HOSP PERFORMED    EKG None  Radiology No results found.  Procedures Procedures   Medications Ordered in ED Medications  ondansetron (ZOFRAN) injection 4 mg (has no administration in time range)    ED Course/ Medical Decision Making/ A&P                               Medical Decision Making Amount and/or Complexity of Data Reviewed Labs: ordered. Radiology: ordered.  Risk Prescription drug management.   This patient presents to the ED for concern of abdominal pain.  Differential diagnosis includes bowel obstruction, hernia, UTI, hyperemesis syndrome    Lab  Tests:  I Ordered, and personally interpreted labs.  The pertinent results include: CBC is unremarkable, CMP with findings of acute dehydration but slight AKI with creatinine elevated to 1.74 and GFR down is 44, UA without signs of infection, troponin negative, UDS positive for tetrahydrocannabinol.    Imaging Studies ordered:  I ordered imaging studies including chest x-ray, CT abdomen pelvis I independently visualized and interpreted imaging which showed no acute findings on chest x-ray, no acute findings on CT abdomen pelvis I agree with the radiologist interpretation  Medicines ordered and prescription drug management:  I ordered medication including fluids, Zofran, fentanyl, compazine for dehydration, nausea, pain Reevaluation of the patient after these medicines showed that the patient improved I have reviewed the patients home medicines and have made adjustments as needed   Problem List / ED Course:  *** Spoke with Dr. Pete Glatter, [facility] service, who advised likely discharge if patient is able to tolerate PO without difficulty. Does not feel that there is any acute indication for admission or transfer to [facility] at this time, but will require outpatient follow up.   Social Determinants of Health:    Final Clinical Impression(s) / ED Diagnoses Final diagnoses:  None    Rx / DC Orders ED Discharge Orders     None

## 2023-08-16 NOTE — ED Notes (Signed)
Patient tolerated PO/fluid challenge.

## 2023-08-18 DIAGNOSIS — Z01818 Encounter for other preprocedural examination: Secondary | ICD-10-CM | POA: Diagnosis not present

## 2023-08-18 DIAGNOSIS — G4733 Obstructive sleep apnea (adult) (pediatric): Secondary | ICD-10-CM | POA: Diagnosis not present

## 2023-08-18 DIAGNOSIS — I129 Hypertensive chronic kidney disease with stage 1 through stage 4 chronic kidney disease, or unspecified chronic kidney disease: Secondary | ICD-10-CM | POA: Diagnosis not present

## 2023-08-18 DIAGNOSIS — J410 Simple chronic bronchitis: Secondary | ICD-10-CM | POA: Diagnosis not present

## 2023-08-18 DIAGNOSIS — N1832 Chronic kidney disease, stage 3b: Secondary | ICD-10-CM | POA: Diagnosis not present

## 2023-08-18 DIAGNOSIS — Z941 Heart transplant status: Secondary | ICD-10-CM | POA: Diagnosis not present

## 2023-08-18 DIAGNOSIS — E1122 Type 2 diabetes mellitus with diabetic chronic kidney disease: Secondary | ICD-10-CM | POA: Diagnosis not present

## 2023-08-19 DIAGNOSIS — Z941 Heart transplant status: Secondary | ICD-10-CM | POA: Diagnosis not present

## 2023-08-19 DIAGNOSIS — F17290 Nicotine dependence, other tobacco product, uncomplicated: Secondary | ICD-10-CM | POA: Diagnosis not present

## 2023-08-19 DIAGNOSIS — E1122 Type 2 diabetes mellitus with diabetic chronic kidney disease: Secondary | ICD-10-CM | POA: Diagnosis not present

## 2023-08-19 DIAGNOSIS — N189 Chronic kidney disease, unspecified: Secondary | ICD-10-CM | POA: Diagnosis not present

## 2023-08-19 DIAGNOSIS — D849 Immunodeficiency, unspecified: Secondary | ICD-10-CM | POA: Diagnosis not present

## 2023-08-19 DIAGNOSIS — Z79899 Other long term (current) drug therapy: Secondary | ICD-10-CM | POA: Diagnosis not present

## 2023-08-19 DIAGNOSIS — R109 Unspecified abdominal pain: Secondary | ICD-10-CM | POA: Diagnosis not present

## 2023-08-19 DIAGNOSIS — E119 Type 2 diabetes mellitus without complications: Secondary | ICD-10-CM | POA: Diagnosis not present

## 2023-08-19 DIAGNOSIS — K922 Gastrointestinal hemorrhage, unspecified: Secondary | ICD-10-CM | POA: Diagnosis not present

## 2023-08-19 DIAGNOSIS — Z48298 Encounter for aftercare following other organ transplant: Secondary | ICD-10-CM | POA: Diagnosis not present

## 2023-08-19 DIAGNOSIS — I129 Hypertensive chronic kidney disease with stage 1 through stage 4 chronic kidney disease, or unspecified chronic kidney disease: Secondary | ICD-10-CM | POA: Diagnosis not present

## 2023-08-19 DIAGNOSIS — Z794 Long term (current) use of insulin: Secondary | ICD-10-CM | POA: Diagnosis not present

## 2023-08-19 DIAGNOSIS — Z2989 Encounter for other specified prophylactic measures: Secondary | ICD-10-CM | POA: Diagnosis not present

## 2023-08-19 DIAGNOSIS — G4700 Insomnia, unspecified: Secondary | ICD-10-CM | POA: Diagnosis not present

## 2023-08-20 DIAGNOSIS — Z2989 Encounter for other specified prophylactic measures: Secondary | ICD-10-CM | POA: Diagnosis not present

## 2023-08-20 DIAGNOSIS — I10 Essential (primary) hypertension: Secondary | ICD-10-CM | POA: Diagnosis not present

## 2023-08-20 DIAGNOSIS — D84821 Immunodeficiency due to drugs: Secondary | ICD-10-CM | POA: Diagnosis not present

## 2023-08-20 DIAGNOSIS — I428 Other cardiomyopathies: Secondary | ICD-10-CM | POA: Diagnosis not present

## 2023-08-20 DIAGNOSIS — Z79899 Other long term (current) drug therapy: Secondary | ICD-10-CM | POA: Diagnosis not present

## 2023-08-20 DIAGNOSIS — T8621 Heart transplant rejection: Secondary | ICD-10-CM | POA: Diagnosis not present

## 2023-08-20 DIAGNOSIS — Z941 Heart transplant status: Secondary | ICD-10-CM | POA: Diagnosis not present

## 2023-08-20 DIAGNOSIS — Z87891 Personal history of nicotine dependence: Secondary | ICD-10-CM | POA: Diagnosis not present

## 2023-08-20 DIAGNOSIS — E785 Hyperlipidemia, unspecified: Secondary | ICD-10-CM | POA: Diagnosis not present

## 2023-08-20 DIAGNOSIS — Z8616 Personal history of COVID-19: Secondary | ICD-10-CM | POA: Diagnosis not present

## 2023-08-20 DIAGNOSIS — Z4821 Encounter for aftercare following heart transplant: Secondary | ICD-10-CM | POA: Diagnosis not present

## 2023-08-27 DIAGNOSIS — R194 Change in bowel habit: Secondary | ICD-10-CM | POA: Diagnosis not present

## 2023-08-27 DIAGNOSIS — Z79621 Long term (current) use of calcineurin inhibitor: Secondary | ICD-10-CM | POA: Diagnosis not present

## 2023-08-27 DIAGNOSIS — Z79624 Long term (current) use of inhibitors of nucleotide synthesis: Secondary | ICD-10-CM | POA: Diagnosis not present

## 2023-08-27 DIAGNOSIS — E1122 Type 2 diabetes mellitus with diabetic chronic kidney disease: Secondary | ICD-10-CM | POA: Diagnosis not present

## 2023-08-27 DIAGNOSIS — Z7952 Long term (current) use of systemic steroids: Secondary | ICD-10-CM | POA: Diagnosis not present

## 2023-08-27 DIAGNOSIS — J449 Chronic obstructive pulmonary disease, unspecified: Secondary | ICD-10-CM | POA: Diagnosis not present

## 2023-08-27 DIAGNOSIS — Z79899 Other long term (current) drug therapy: Secondary | ICD-10-CM | POA: Diagnosis not present

## 2023-08-27 DIAGNOSIS — Z7982 Long term (current) use of aspirin: Secondary | ICD-10-CM | POA: Diagnosis not present

## 2023-08-27 DIAGNOSIS — J410 Simple chronic bronchitis: Secondary | ICD-10-CM | POA: Diagnosis not present

## 2023-08-27 DIAGNOSIS — G4733 Obstructive sleep apnea (adult) (pediatric): Secondary | ICD-10-CM | POA: Diagnosis not present

## 2023-08-27 DIAGNOSIS — R140 Abdominal distension (gaseous): Secondary | ICD-10-CM | POA: Diagnosis not present

## 2023-08-27 DIAGNOSIS — K635 Polyp of colon: Secondary | ICD-10-CM | POA: Diagnosis not present

## 2023-08-27 DIAGNOSIS — D84821 Immunodeficiency due to drugs: Secondary | ICD-10-CM | POA: Diagnosis not present

## 2023-08-27 DIAGNOSIS — R1084 Generalized abdominal pain: Secondary | ICD-10-CM | POA: Diagnosis not present

## 2023-08-27 DIAGNOSIS — I130 Hypertensive heart and chronic kidney disease with heart failure and stage 1 through stage 4 chronic kidney disease, or unspecified chronic kidney disease: Secondary | ICD-10-CM | POA: Diagnosis not present

## 2023-08-27 DIAGNOSIS — K621 Rectal polyp: Secondary | ICD-10-CM | POA: Diagnosis not present

## 2023-08-27 DIAGNOSIS — Z941 Heart transplant status: Secondary | ICD-10-CM | POA: Diagnosis not present

## 2023-08-27 DIAGNOSIS — Z1211 Encounter for screening for malignant neoplasm of colon: Secondary | ICD-10-CM | POA: Diagnosis not present

## 2023-08-27 DIAGNOSIS — Z794 Long term (current) use of insulin: Secondary | ICD-10-CM | POA: Diagnosis not present

## 2023-08-27 DIAGNOSIS — N1832 Chronic kidney disease, stage 3b: Secondary | ICD-10-CM | POA: Diagnosis not present

## 2023-08-27 DIAGNOSIS — N183 Chronic kidney disease, stage 3 unspecified: Secondary | ICD-10-CM | POA: Diagnosis not present

## 2023-08-27 DIAGNOSIS — I428 Other cardiomyopathies: Secondary | ICD-10-CM | POA: Diagnosis not present

## 2023-08-27 DIAGNOSIS — I5020 Unspecified systolic (congestive) heart failure: Secondary | ICD-10-CM | POA: Diagnosis not present

## 2023-08-27 DIAGNOSIS — I129 Hypertensive chronic kidney disease with stage 1 through stage 4 chronic kidney disease, or unspecified chronic kidney disease: Secondary | ICD-10-CM | POA: Diagnosis not present

## 2023-09-11 DIAGNOSIS — Z4821 Encounter for aftercare following heart transplant: Secondary | ICD-10-CM | POA: Diagnosis not present

## 2023-09-11 DIAGNOSIS — Z941 Heart transplant status: Secondary | ICD-10-CM | POA: Diagnosis not present

## 2023-09-11 DIAGNOSIS — Z79899 Other long term (current) drug therapy: Secondary | ICD-10-CM | POA: Diagnosis not present

## 2023-09-11 DIAGNOSIS — Z48298 Encounter for aftercare following other organ transplant: Secondary | ICD-10-CM | POA: Diagnosis not present

## 2023-09-11 DIAGNOSIS — Z2989 Encounter for other specified prophylactic measures: Secondary | ICD-10-CM | POA: Diagnosis not present

## 2023-09-19 DIAGNOSIS — Z941 Heart transplant status: Secondary | ICD-10-CM | POA: Diagnosis not present

## 2023-09-19 DIAGNOSIS — Z79899 Other long term (current) drug therapy: Secondary | ICD-10-CM | POA: Diagnosis not present

## 2023-09-19 DIAGNOSIS — Z48298 Encounter for aftercare following other organ transplant: Secondary | ICD-10-CM | POA: Diagnosis not present

## 2023-09-19 DIAGNOSIS — D849 Immunodeficiency, unspecified: Secondary | ICD-10-CM | POA: Diagnosis not present

## 2023-09-19 DIAGNOSIS — Z2989 Encounter for other specified prophylactic measures: Secondary | ICD-10-CM | POA: Diagnosis not present

## 2023-11-17 ENCOUNTER — Other Ambulatory Visit: Payer: Self-pay | Admitting: Family Medicine

## 2023-11-17 DIAGNOSIS — R059 Cough, unspecified: Secondary | ICD-10-CM

## 2023-11-17 NOTE — Telephone Encounter (Signed)
 Copied from CRM [id number]. Topic: Clinical - Medication Refill >> Nov 17, 2023  1:19 PM Drema MATSU wrote: Most Recent Primary Care Visit:  Provider: TANDA ROJELIO ORN  Department: [facility]  Visit Type: MEDICARE WELL VISIT  Date: 12/20/2022  Medication: Herlene 3  sensors  Breztri  Inhaler   Has the patient contacted their pharmacy? Yes (Agent: If no, request that the patient contact the pharmacy for the refill. If patient does not wish to contact the pharmacy document the reason why and proceed with request.) (Agent: If yes, when and what did the pharmacy advise?) Advised to go through doctor he has no more refills   Is this the correct pharmacy for this prescription? Yes If no, delete pharmacy and type the correct one.  This is the patient's preferred pharmacy:  [facility] [address] ([address] [place] - [address] [place] (SE) [place] [address] Phone: [phone] Fax: [phone]   Has the prescription been filled recently? No  Is the patient out of the medication? Yes  Has the patient been seen for an appointment in the last year OR does the patient have an upcoming appointment? Yes  Can we respond through MyChart? Yes  Agent: Please be advised that Rx refills may take up to 3 business days. We ask that you follow-up with your pharmacy.

## 2023-11-17 NOTE — Telephone Encounter (Signed)
 Copied from CRM [id number]. Topic: Clinical - Medication Refill >> Nov 17, 2023  1:15 PM Drema MATSU wrote: Most Recent Primary Care Visit:  Provider: TANDA ROJELIO ORN  Department: [facility]  Visit Type: MEDICARE WELL VISIT  Date: 12/20/2022  Medication: ***  Has the patient contacted their pharmacy?  (Agent: If no, request that the patient contact the pharmacy for the refill. If patient does not wish to contact the pharmacy document the reason why and proceed with request.) (Agent: If yes, when and what did the pharmacy advise?)  Is this the correct pharmacy for this prescription?  If no, delete pharmacy and type the correct one.  This is the patient's preferred pharmacy:  [facility] [address] ([address]), [place] - [address] [place] ([place]) [place] [address] Phone: [phone] Fax: [phone]  [id number] [place], [place] - [address] AT [facility] [address] [place] [address] Phone: [phone] Fax: [phone]   Has the prescription been filled recently?   Is the patient out of the medication?   Has the patient been seen for an appointment in the last year OR does the patient have an upcoming appointment?   Can we respond through MyChart?   Agent: Please be advised that Rx refills may take up to 3 business days. We ask that you follow-up with your pharmacy.

## 2023-11-18 DIAGNOSIS — E7800 Pure hypercholesterolemia, unspecified: Secondary | ICD-10-CM | POA: Diagnosis not present

## 2023-11-18 DIAGNOSIS — D849 Immunodeficiency, unspecified: Secondary | ICD-10-CM | POA: Diagnosis not present

## 2023-11-18 DIAGNOSIS — E1122 Type 2 diabetes mellitus with diabetic chronic kidney disease: Secondary | ICD-10-CM | POA: Diagnosis not present

## 2023-11-18 DIAGNOSIS — I071 Rheumatic tricuspid insufficiency: Secondary | ICD-10-CM | POA: Diagnosis not present

## 2023-11-18 DIAGNOSIS — Z23 Encounter for immunization: Secondary | ICD-10-CM | POA: Diagnosis not present

## 2023-11-18 DIAGNOSIS — Z48298 Encounter for aftercare following other organ transplant: Secondary | ICD-10-CM | POA: Diagnosis not present

## 2023-11-18 DIAGNOSIS — N183 Chronic kidney disease, stage 3 unspecified: Secondary | ICD-10-CM | POA: Diagnosis not present

## 2023-11-18 DIAGNOSIS — I10 Essential (primary) hypertension: Secondary | ICD-10-CM | POA: Diagnosis not present

## 2023-11-18 DIAGNOSIS — Z79899 Other long term (current) drug therapy: Secondary | ICD-10-CM | POA: Diagnosis not present

## 2023-11-18 DIAGNOSIS — Z941 Heart transplant status: Secondary | ICD-10-CM | POA: Diagnosis not present

## 2023-11-18 DIAGNOSIS — Z4821 Encounter for aftercare following heart transplant: Secondary | ICD-10-CM | POA: Diagnosis not present

## 2023-11-18 DIAGNOSIS — E119 Type 2 diabetes mellitus without complications: Secondary | ICD-10-CM | POA: Diagnosis not present

## 2023-11-18 DIAGNOSIS — I129 Hypertensive chronic kidney disease with stage 1 through stage 4 chronic kidney disease, or unspecified chronic kidney disease: Secondary | ICD-10-CM | POA: Diagnosis not present

## 2023-11-18 DIAGNOSIS — Z2989 Encounter for other specified prophylactic measures: Secondary | ICD-10-CM | POA: Diagnosis not present

## 2023-11-25 DIAGNOSIS — E119 Type 2 diabetes mellitus without complications: Secondary | ICD-10-CM | POA: Diagnosis not present

## 2023-12-09 DIAGNOSIS — E119 Type 2 diabetes mellitus without complications: Secondary | ICD-10-CM | POA: Diagnosis not present

## 2023-12-10 ENCOUNTER — Ambulatory Visit (INDEPENDENT_AMBULATORY_CARE_PROVIDER_SITE_OTHER): Payer: 59 | Admitting: Family Medicine

## 2023-12-10 ENCOUNTER — Encounter: Payer: Self-pay | Admitting: Family Medicine

## 2023-12-10 VITALS — BP 139/82 | HR 85 | Temp 97.8°F | Ht 72.0 in | Wt 220.0 lb

## 2023-12-10 DIAGNOSIS — E118 Type 2 diabetes mellitus with unspecified complications: Secondary | ICD-10-CM | POA: Diagnosis not present

## 2023-12-10 DIAGNOSIS — Z794 Long term (current) use of insulin: Secondary | ICD-10-CM | POA: Diagnosis not present

## 2023-12-10 DIAGNOSIS — J449 Chronic obstructive pulmonary disease, unspecified: Secondary | ICD-10-CM | POA: Diagnosis not present

## 2023-12-10 DIAGNOSIS — N528 Other male erectile dysfunction: Secondary | ICD-10-CM

## 2023-12-10 LAB — MICROALBUMIN / CREATININE URINE RATIO
Creatinine,U: 196.3 mg/dL
Microalb Creat Ratio: 2.4 mg/g (ref 0.0–30.0)
Microalb, Ur: 4.7 mg/dL — ABNORMAL HIGH (ref 0.0–1.9)

## 2023-12-10 LAB — HEMOGLOBIN A1C: Hgb A1c MFr Bld: 6.8 % — ABNORMAL HIGH (ref 4.6–6.5)

## 2023-12-10 MED ORDER — SILDENAFIL CITRATE 50 MG PO TABS: 50.0000 mg | ORAL_TABLET | Freq: Every day | ORAL | 2 refills | Status: DC | PRN

## 2023-12-10 MED ORDER — BREZTRI AEROSPHERE 160-9-4.8 MCG/ACT IN AERO: 2.0000 | INHALATION_SPRAY | Freq: Two times a day (BID) | RESPIRATORY_TRACT | 5 refills | Status: DC

## 2023-12-10 MED ORDER — FREESTYLE LIBRE 3 PLUS SENSOR MISC: 1.0000 | 11 refills | Status: AC

## 2023-12-10 NOTE — Assessment & Plan Note (Signed)
 Chronic, stable, lungs clear on exam today. Pt is requesting refills of the breztri , states this is working very well for him, no side effects reported. States that he doesn't really have to use his rescue inhaler since he started on this medication. Will refill for him today.

## 2023-12-10 NOTE — Assessment & Plan Note (Signed)
 Sees an endocrinologist, however he has not had urine microalbumin testing, will also check new A1C today also. Continue dietary control plus PRN short acting insulin , he needs refills on this CGM sensors today, likes using these and is getting good readings at home

## 2023-12-10 NOTE — Assessment & Plan Note (Signed)
 Chronic ,stable, refills for viagra  sent to pharmacy

## 2023-12-10 NOTE — Progress Notes (Signed)
 Established Patient Office Visit  Subjective   Patient ID: Bruce Robertson, male    DOB: 1960-12-21  Age: 63 y.o. MRN: [id number]  Chief Complaint  Patient presents with   Transitions Of Care    Pt here to establish care - without any concerns - pt not fasting today, declined all vaccines today    Depression   Congestive Heart Failure   Gastroesophageal Reflux   Diabetes    Pt states that he has had Diabetic eye and foot exam at [facility] in 2024    Pt is here for 1 year follow up. I saw him December 2023 for TOC.   DM-- pt had telemedicine visit with his endocrinologist yesterday. States that last A1C was in October, was 6.9, states that he is only using his insulin  as needed at home. Still has CGM, does occasionally gets readings over 200 but not very often. I reviewed his labs in the Care Everywhere section. He is due for his urine testing today and will repeat foot exam.   Insomnia- pt reports it is much better than last year, no longer taking sleep aids, getting adequate sleep.   ED-- pt requesting refill of his viagra  today, states that it is working well for him, no side effects reported.   I extensively reviewed his medication list and updated it to current medications. He is off amlodipine , digoxin , pepcid , etc.    Current Outpatient Medications  Medication Instructions   ACCU-CHEK GUIDE test strip Use as directed-patient needs an appt   Accu-Chek Softclix Lancets lancets Use as directed   blood glucose meter kit and supplies KIT Dispense based on patient and insurance preference. Use up to four times daily as directed.   BREZTRI  AEROSPHERE 160-9-4.8 MCG/ACT AERO 2 puffs, Inhalation, 2 times daily   Continuous Glucose Sensor (FREESTYLE LIBRE 3 PLUS SENSOR) MISC 1 Application, Transdermal, Every 14 days   gabapentin  (NEURONTIN ) 100 mg, 3 times daily   insulin  lispro (HUMALOG) 2 Units, As needed   mycophenolate (MYFORTIC) 720 mg, 2 times daily   ondansetron  (ZOFRAN -ODT) 8  mg, Oral, Every 8 hours PRN   pravastatin  (PRAVACHOL ) 40 mg, Daily at bedtime   predniSONE  (DELTASONE ) 5 mg, Daily with breakfast   RELION PEN NEEDLES 31G X 6 MM MISC    sildenafil  (VIAGRA ) 50 mg, Oral, Daily PRN   tacrolimus  (PROGRAF ) 1 MG capsule Take by mouth. 9 tablets twice a day   valACYclovir  (VALTREX ) 500 mg, 2 times daily    Patient Active Problem List   Diagnosis Date Noted   Chronic obstructive pulmonary disease (HCC) 12/10/2023   Other male erectile dysfunction 12/10/2023   Chronic insomnia 10/16/2022   Acute on chronic systolic heart failure, NYHA class 4 (HCC) 08/20/2022   Acute on chronic systolic CHF (congestive heart failure) (HCC) 04/25/2022   Acute on chronic systolic (congestive) heart failure (HCC) 04/25/2022   CHF exacerbation (HCC) 04/15/2022   Type 2 diabetes mellitus with complication, without long-term current use of insulin  (HCC) 04/04/2022   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   Dizziness 12/31/2020   Patellar tendon rupture, right, initial encounter 09/13/2020   Restrictive lung disease 06/22/2020   BRBPR (bright red blood per rectum) 05/24/2020   Chronic systolic CHF (congestive heart failure) (HCC) 05/24/2020   Paroxysmal atrial fibrillation (HCC) 03/28/2020   Secondary hypercoagulable state (HCC) 03/28/2020   Educated about COVID-19 virus infection 02/10/2020   S/P ICD (internal cardiac defibrillator) procedure 07/18/2019   Nontraumatic complete tear of right rotator cuff 06/29/2019  Pain in left elbow 06/29/2019   Thrombocytopenia (HCC) 06/10/2018   AICD (automatic cardioverter/defibrillator) present 06/10/2018   History of peptic ulcer disease 06/10/2018   NSVT (nonsustained ventricular tachycardia) (HCC)    Hypotension due to drugs 02/09/2018   Difficulty controlling anger 12/31/2016   Anxiety 08/20/2016   Moderate episode of recurrent major depressive disorder (HCC) 08/20/2016   Polyp of colon 04/29/2016   Shortness of breath 03/05/2016    Essential hypertension 10/31/2015   Acute on chronic systolic heart failure (HCC) 08/29/2015   Hx of CABG 08/29/2015   Stage 3 chronic kidney disease (HCC) 08/29/2015   OSA treated with BiPAP 06/10/2011   Fatigue 06/10/2011   Obesity (BMI 30-39.9) 05/02/2010   Hemorrhoids 02/08/2010   GERD (gastroesophageal reflux disease) 02/08/2010   Rectal bleeding 02/08/2010   Hyperlipidemia 04/06/2009   Cardiomyopathy, dilated, nonischemic (HCC) 04/06/2009   Non-ischemic cardiomyopathy (HCC) 04/06/2009      Review of Systems  All other systems reviewed and are negative.     Objective:     BP 139/82   Pulse 85   Temp 97.8 F (36.6 C)   Ht 6' (1.829 m)   Wt 220 lb (99.8 kg)   SpO2 96%   BMI 29.84 kg/m    Physical Exam Vitals reviewed.  Constitutional:      Appearance: Normal appearance. He is well-groomed and normal weight.  Eyes:     Extraocular Movements: Extraocular movements intact.     Conjunctiva/sclera: Conjunctivae normal.  Neck:     Thyroid : No thyromegaly.  Cardiovascular:     Rate and Rhythm: Regular rhythm.     Heart sounds: S1 normal and S2 normal. No murmur heard. Pulmonary:     Effort: Pulmonary effort is normal.     Breath sounds: Normal breath sounds and air entry. No rales.  Musculoskeletal:     Right lower leg: No edema.     Left lower leg: No edema.  Neurological:     General: No focal deficit present.     Mental Status: He is alert and oriented to person, place, and time.     Gait: Gait is intact.  Psychiatric:        Mood and Affect: Mood and affect normal.    Diabetic Foot Exam - Simple   Simple Foot Form Diabetic Foot exam was performed with the following findings: Yes 12/10/2023  9:45 AM  Visual Inspection No deformities, no ulcerations, no other skin breakdown bilaterally: Yes Sensation Testing Intact to touch and monofilament testing bilaterally: Yes Pulse Check Posterior Tibialis and Dorsalis pulse intact bilaterally: Yes Comments       No results found for any visits on 12/10/23.  Last metabolic panel Lab Results  Component Value Date   GLUCOSE 144 (H) 08/16/2023   NA 136 08/16/2023   K 4.5 08/16/2023   CL 98 08/16/2023   CO2 25 08/16/2023   BUN 31 (H) 08/16/2023   CREATININE 1.74 (H) 08/16/2023   GFRNONAA 44 (L) 08/16/2023   CALCIUM  11.0 (H) 08/16/2023   PROT 8.2 (H) 08/16/2023   ALBUMIN 5.1 (H) 08/16/2023   LABGLOB 3.1 05/17/2021   AGRATIO 1.5 05/17/2021   BILITOT 1.0 08/16/2023   ALKPHOS 54 08/16/2023   AST 23 08/16/2023   ALT 22 08/16/2023   ANIONGAP 13 08/16/2023      The 10-year ASCVD risk score (Arnett DK, et al., 2019) is: 16.9%    Assessment & Plan:  Type 2 diabetes mellitus with complication, without long-term current use  of insulin  [facility]) Assessment & Plan: Sees an endocrinologist, however he has not had urine microalbumin testing, will also check new A1C today also. Continue dietary control plus PRN short acting insulin , he needs refills on this CGM sensors today, likes using these and is getting good readings at home  Orders: -     Microalbumin / creatinine urine ratio -     Hemoglobin A1c -     FreeStyle Libre 3 Plus Sensor; Place 1 Application onto the skin every 14 (fourteen) days.  Dispense: 2 each; Refill: 57  Other male erectile dysfunction Assessment & Plan: Chronic ,stable, refills for viagra  sent to pharmacy  Orders: -     Sildenafil  Citrate; Take 1 tablet (50 mg total) by mouth daily as needed for erectile dysfunction.  Dispense: 10 tablet; Refill: 2  Chronic obstructive pulmonary disease, unspecified COPD type (HCC) Assessment & Plan: Chronic, stable, lungs clear on exam today. Pt is requesting refills of the breztri , states this is working very well for him, no side effects reported. States that he doesn't really have to use his rescue inhaler since he started on this medication. Will refill for him today.   Orders: -     Breztri  Aerosphere; Inhale 2 puffs into the  lungs 2 (two) times daily.  Dispense: 10.7 g; Refill: 5     Return in about 6 months (around 06/08/2024).    Heron CHRISTELLA Sharper, MD

## 2023-12-19 ENCOUNTER — Encounter: Payer: Self-pay | Admitting: Family Medicine

## 2023-12-24 ENCOUNTER — Emergency Department (HOSPITAL_BASED_OUTPATIENT_CLINIC_OR_DEPARTMENT_OTHER)
Admission: EM | Admit: 2023-12-24 | Discharge: 2023-12-24 | Disposition: A | Discharge status: Home / Self Care | Payer: 59 | Attending: Emergency Medicine | Admitting: Emergency Medicine

## 2023-12-24 ENCOUNTER — Other Ambulatory Visit: Payer: Self-pay

## 2023-12-24 ENCOUNTER — Encounter (HOSPITAL_BASED_OUTPATIENT_CLINIC_OR_DEPARTMENT_OTHER): Payer: Self-pay | Admitting: Emergency Medicine

## 2023-12-24 DIAGNOSIS — I10 Essential (primary) hypertension: Secondary | ICD-10-CM | POA: Diagnosis not present

## 2023-12-24 DIAGNOSIS — Z794 Long term (current) use of insulin: Secondary | ICD-10-CM | POA: Insufficient documentation

## 2023-12-24 DIAGNOSIS — Z79899 Other long term (current) drug therapy: Secondary | ICD-10-CM | POA: Insufficient documentation

## 2023-12-24 DIAGNOSIS — R519 Headache, unspecified: Secondary | ICD-10-CM | POA: Diagnosis present

## 2023-12-24 MED ORDER — AMLODIPINE BESYLATE 5 MG PO TABS
5.0000 mg | ORAL_TABLET | Freq: Once | ORAL | Status: AC
  Administered 2023-12-24: 5 mg via ORAL
  Filled 2023-12-24: qty 1

## 2023-12-24 MED ORDER — AMLODIPINE BESYLATE 5 MG PO TABS: 5.0000 mg | ORAL_TABLET | Freq: Every day | ORAL | 3 refills | Status: DC

## 2023-12-24 NOTE — Discharge Instructions (Signed)
Please call your transplant doctors and let them know about your blood pressure.  Let them know that I started you back on your blood pressure medicine.    Return for worsening headache, one sided numbness or weakness or difficulty with speech or swallowing.  Return for chest pain or difficulty breathing.

## 2023-12-24 NOTE — ED Triage Notes (Signed)
Reports HTN at home 180s sys BP. HA. Denies CP SOB. 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 No HTN meds. Sees cards every other month.

## 2023-12-24 NOTE — ED Notes (Signed)
 RN reviewed discharge instructions with pt. Pt verbalized understanding and had no further questions. VSS upon discharge.

## 2023-12-24 NOTE — ED Provider Notes (Signed)
[place] EMERGENCY DEPARTMENT AT [facility] Provider Note   CSN: [id number] Arrival date & time: 12/24/23  1701     History  Chief Complaint  Patient presents with   Hypertension   Headache    Bruce Robertson is a 63 y.o. male.  63 yo M with a cc of high blood pressure.  Patient is seeking employment with the city and during a routine physical was noted to be hypertensive.  Had multiple checks.  Mild posterior headache otherwise denies symptoms.  Denies head injury, denies one sided numbness or weakness or difficulty with speech or swallowing.  Denies chest pain or difficulty breathing.  Took his blood pressure at home a few times.  Tried calling his transplant doc.  Came here for eval.    Hypertension Associated symptoms include headaches.  Headache      Home Medications Prior to Admission medications   Medication Sig Start Date End Date Taking? Authorizing Provider  amLODipine (NORVASC) 5 MG tablet Take 1 tablet (5 mg total) by mouth daily. 12/24/23  Yes Melene Plan, DO  ACCU-CHEK GUIDE test strip Use as directed-patient needs an appt 08/12/22   Karie Georges, MD  Accu-Chek Softclix Lancets lancets Use as directed 08/13/22   Karie Georges, MD  blood glucose meter kit and supplies KIT Dispense based on patient and insurance preference. Use up to four times daily as directed. 02/14/20   Koberlein, Paris Lore, MD  BREZTRI AEROSPHERE 160-9-4.8 MCG/ACT AERO Inhale 2 puffs into the lungs 2 (two) times daily. 12/10/23   Karie Georges, MD  Continuous Glucose Sensor (FREESTYLE LIBRE 3 PLUS SENSOR) MISC Place 1 Application onto the skin every 14 (fourteen) days. 12/10/23   Karie Georges, MD  gabapentin (NEURONTIN) 100 MG capsule Take 100 mg by mouth 3 (three) times daily. 11/27/23   [provider]  insulin lispro (HUMALOG) 100 UNIT/ML injection Inject 2 Units into the skin as needed.    [provider]  mycophenolate (MYFORTIC) 360 MG TBEC EC  tablet Take 720 mg by mouth 2 (two) times daily. 10/12/22   [provider]  ondansetron (ZOFRAN-ODT) 8 MG disintegrating tablet Take 1 tablet (8 mg total) by mouth every 8 (eight) hours as needed for nausea or vomiting. 02/26/23   Linwood Dibbles, MD  pravastatin (PRAVACHOL) 40 MG tablet Take 40 mg by mouth at bedtime. Patient not taking: Reported on 12/10/2023    [provider]  predniSONE (DELTASONE) 5 MG tablet Take 5 mg by mouth daily with breakfast.    [provider]  RELION PEN NEEDLES 31G X 6 MM MISC  07/09/23   [provider]  sildenafil (VIAGRA) 50 MG tablet Take 1 tablet (50 mg total) by mouth daily as needed for erectile dysfunction. 12/10/23   Karie Georges, MD  tacrolimus (PROGRAF) 1 MG capsule Take by mouth. 9 tablets twice a day 12/05/23   [provider]  valACYclovir (VALTREX) 500 MG tablet Take 500 mg by mouth 2 (two) times daily. Patient not taking: Reported on 12/10/2023    [provider]      Allergies    Patient has no known allergies.    Review of Systems   Review of Systems  Neurological:  Positive for headaches.    Physical Exam Updated Vital Signs BP (!) 138/96 (BP Location: Left Arm)   Pulse 89   Temp 98.3 F (36.8 C) (Oral)   Resp 17   SpO2 99%  Physical Exam Vitals  and nursing note reviewed.  Constitutional:      Appearance: He is well-developed.  HENT:     Head: Normocephalic and atraumatic.  Eyes:     Pupils: Pupils are equal, round, and reactive to light.  Neck:     Vascular: No JVD.  Cardiovascular:     Rate and Rhythm: Normal rate and regular rhythm.     Heart sounds: No murmur heard.    No friction rub. No gallop.  Pulmonary:     Effort: No respiratory distress.     Breath sounds: No wheezing.  Abdominal:     General: There is no distension.     Tenderness: There is no abdominal tenderness. There is no guarding or rebound.  Musculoskeletal:        General: Normal range of motion.      Cervical back: Normal range of motion and neck supple.  Skin:    Coloration: Skin is not pale.     Findings: No rash.  Neurological:     Mental Status: He is alert and oriented to person, place, and time.     Cranial Nerves: Cranial nerves 2-12 are intact.     Sensory: Sensation is intact.     Motor: Motor function is intact.     Coordination: Coordination is intact.     Comments: Benign neuro exam  Psychiatric:        Behavior: Behavior normal.     ED Results / Procedures / Treatments   Labs (all labs ordered are listed, but only abnormal results are displayed) Labs Reviewed - No data to display  EKG None  Radiology No results found.  Procedures Procedures    Medications Ordered in ED Medications  amLODipine (NORVASC) tablet 5 mg (has no administration in time range)    ED Course/ Medical Decision Making/ A&P                                 Medical Decision Making Risk Prescription drug management.   63 yo M with a cc of high blood pressure.  Noticed yesterday and routine physical.  Record review patient hx of heart transplant.  BP goal 140/90.  Sounds like it was over the past couple days.  Mild headache sounds like tension.  Patient was on amlodipine in the past.  Will restart for him.  Transplant follow up.   Patient asymptomatic with no noted s/s of end organ damage.  No chest pain, diaphoresis, nausea or other acs symptoms.  No unequal pulses, normal pulse ox without rales or sob.  Feel this is unlikely to be a Hypertensive Emergency and recent studies suggest no benefit for inpatient admission.  There are also no studies to my knowledge suggesting that patients with hypertensive urgency have increased risk for end organ disease. In fact there has been a study recently that would suggest that the rapid change can induce harm.  The [employer] policy statement on asymptomatic hypertension does not  recommend routing ED medical  intervention. The patient will follow up closely with their PCP.  Compliance with their medication stressed.   The management of elevated blood pressure in the acute care setting: a scientific statement from the American Heart Association Bress AP, Jena Gauss, Flack JM, et al. Hypertension. May 2024. doi: 10.1161/HYP.0000000000000238  Chester Holstein, Christell Constant EH, et al. Characteristics and outcomes of patients presenting with hypertensive urgency in the office  setting. JAMA Intern Med. 2016 Jul 1; 176(7): 981-8.   Cerebrovascular risks with rapid blood pressure lowering in the absence of hypertensive emergency Laverle Hobby, Ronalee Red, et al. Am J Emerg Med. 2019;37(6):1073-1077.   5:37 PM:  I have discussed the diagnosis/risks/treatment options with the patient.  Evaluation and diagnostic testing in the emergency department does not suggest an emergent condition requiring admission or immediate intervention beyond what has been performed at this time.  They will follow up with Transplant. We also discussed returning to the ED immediately if new or worsening sx occur. We discussed the sx which are most concerning (e.g., sudden worsening pain, fever, inability to tolerate by mouth, stroke s/sx) that necessitate immediate return. Medications administered to the patient during their visit and any new prescriptions provided to the patient are listed below.  Medications given during this visit Medications  amLODipine (NORVASC) tablet 5 mg (has no administration in time range)     The patient appears reasonably screen and/or stabilized for discharge and I doubt any other medical condition or other [facility] requiring further screening, evaluation, or treatment in the ED at this time prior to discharge.         Final Clinical Impression(s) / ED Diagnoses Final diagnoses:  Uncontrolled hypertension    Rx / DC Orders ED Discharge Orders          Ordered    amLODipine (NORVASC) 5 MG tablet   Daily        12/24/23 1731              Melene Plan, DO 12/24/23 1738

## 2023-12-25 ENCOUNTER — Ambulatory Visit: Payer: Self-pay | Admitting: Family Medicine

## 2023-12-25 ENCOUNTER — Encounter: Payer: Self-pay | Admitting: Internal Medicine

## 2023-12-25 ENCOUNTER — Ambulatory Visit: Payer: 59 | Admitting: Internal Medicine

## 2023-12-25 ENCOUNTER — Telehealth: Payer: Self-pay

## 2023-12-25 VITALS — BP 122/85 | HR 107 | Temp 98.2°F | Ht 72.0 in | Wt 215.1 lb

## 2023-12-25 DIAGNOSIS — I10 Essential (primary) hypertension: Secondary | ICD-10-CM

## 2023-12-25 DIAGNOSIS — J449 Chronic obstructive pulmonary disease, unspecified: Secondary | ICD-10-CM

## 2023-12-25 MED ORDER — BREZTRI AEROSPHERE 160-9-4.8 MCG/ACT IN AERO: 2.0000 | INHALATION_SPRAY | Freq: Two times a day (BID) | RESPIRATORY_TRACT | 5 refills | Status: DC

## 2023-12-25 NOTE — Telephone Encounter (Signed)
Patient has an appt on 2/20

## 2023-12-25 NOTE — Progress Notes (Signed)
Established Patient Office Visit     CC/Reason for Visit: Blood pressure follow-up  HPI: Bruce Robertson is a 63 y.o. male who is coming in today for the above mentioned reasons. Past Medical History is significant for: Heart transplant in the fall 2024 at [facility].  He is trying to get off disability and to get a job.  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  He then went to the emergency department 2 nights ago and was started on amlodipine 5 mg daily.  He states at home his blood pressure has been in the 130/90 range.  Here in office today 2 separate measurements are 128/86 and 122/85.   Past Medical/Surgical History: Past Medical History:  Diagnosis Date   AICD (automatic cardioverter/defibrillator) present    Anxiety 08/20/2016   Barrett's esophagus 2002   Cardiomyopathy    Nonischemic. EF has been about 45%. S/P CABG.;  b.  Echo 4/14: EF 25%, global HK with inf and mid apical AK, restrictive physiology with E/e' > 15 (elevated LV filling pressure), trivial AI/MR, mild to mod LAE, mild RVE, mild reduced RVSF, mild RAE, mod TR, PASP 74 (severe pulmonary HTN)    CHF (congestive heart failure) (HCC)    CHF exacerbation (HCC) 07/31/2016   Colon polyps 2002   Coronary artery disease    Diabetes mellitus without complication (HCC)    Type II   Dyslipidemia    hx (03/20/2017)   GERD (gastroesophageal reflux disease)    History of bleeding peptic ulcer    History of echocardiogram    Echo 10/16:  EF 25-30%, poss non-compaction, diff HK with inf-lat and apical HK, restrictive physio, mild AI, severe LAE, mild RVE with mild reduced RVSF, PASP 42 mmHg   Hyperplastic rectal polyp    Moderate episode of recurrent major depressive disorder (HCC) 08/20/2016   09/14/20 - not current   OSA on CPAP    Mild   Pneumonia 09/2018   Tobacco abuse    Remote    Past Surgical History:  Procedure Laterality Date   CARDIAC CATHETERIZATION  2007;  2009    CARDIAC DEFIBRILLATOR PLACEMENT  03/20/2017   COLONOSCOPY WITH PROPOFOL N/A 01/21/2019   Procedure: COLONOSCOPY WITH PROPOFOL;  Surgeon: Rachael Fee, MD;  Location: [facility] ENDOSCOPY;  Service: Endoscopy;  Laterality: N/A;   CORONARY ARTERY BYPASS GRAFT  2007   Had a left main dissection after catheterization. Underwent a saphenous vein graft to the LAD and a saphenous vein graft to obtuse marginal.   HEART TRANSPLANT  08/2022   HEMORRHOID SURGERY N/A 08/23/2020   Procedure: HEMORRHOIDECTOMY WITH LIGATION AND HEMORRHOIDOPEXY;  Surgeon: Karie Soda, MD;  Location: [facility] ORS;  Service: General;  Laterality: N/A;   ICD IMPLANT N/A 03/20/2017   Procedure: ICD Implant;  Surgeon: Regan Lemming, MD;  Location: [facility] INVASIVE CV LAB;  Service: Cardiovascular;  Laterality: N/A;   IR FLUORO GUIDE CV LINE RIGHT  04/29/2022   IR US GUIDE VASC ACCESS RIGHT  04/29/2022   PATELLAR TENDON REPAIR Right 2005   PATELLAR TENDON REPAIR Right 09/21/2020   Procedure: PATELLA TENDON REPAIR;  Surgeon: Tarry Kos, MD;  Location: [facility] OR;  Service: Orthopedics;  Laterality: Right;   PLACEMENT OF IMPELLA LEFT VENTRICULAR ASSIST DEVICE N/A 08/22/2022   Procedure: PLACEMENT OF IMPELLA 5.5 LEFT VENTRICULAR ASSIST DEVICE;  Surgeon: Lovett Sox, MD;  Location: [facility] OR;  Service: Open Heart Surgery;  Laterality: N/A;  POLYPECTOMY  01/21/2019   Procedure: POLYPECTOMY;  Surgeon: Rachael Fee, MD;  Location: [facility];  Service: Endoscopy;;   RECTAL EXAM UNDER ANESTHESIA Left 08/23/2020   Procedure: RECTAL EXAM UNDER ANESTHESIA;  Surgeon: Karie Soda, MD;  Location: [facility] ORS;  Service: General;  Laterality: Left;   RIGHT HEART CATH N/A 04/25/2022   Procedure: RIGHT HEART CATH;  Surgeon: Dolores Patty, MD;  Location: [facility] INVASIVE CV LAB;  Service: Cardiovascular;  Laterality: N/A;   RIGHT HEART CATH N/A 08/20/2022   Procedure: RIGHT HEART CATH;  Surgeon: Dolores Patty, MD;  Location: [facility] INVASIVE CV LAB;   Service: Cardiovascular;  Laterality: N/A;   TEE WITHOUT CARDIOVERSION N/A 08/22/2022   Procedure: TRANSESOPHAGEAL ECHOCARDIOGRAM (TEE);  Surgeon: Lovett Sox, MD;  Location: [facility] OR;  Service: Open Heart Surgery;  Laterality: N/A;    Social History:  reports that he has quit smoking. His smoking use included cigarettes. He has a 1.8 pack-year smoking history. He has never used smokeless tobacco. He reports that he does not currently use alcohol. He reports current drug use. Frequency: 1.00 time per week. Drug: Marijuana.  Allergies: No Known Allergies  Family History:  Family History  Problem Relation Age of Onset   Hypertension Mother    Osteoarthritis Mother    Heart failure Father 35   High blood pressure Father    Bipolar disorder Daughter    Diabetes Daughter    Cancer Maternal Uncle        uncertain type   Cancer Maternal Grandmother        uncertain type   Diabetes Niece    Lung cancer Other    Asthma Neg Hx    Breast cancer Neg Hx      Current Outpatient Medications:    ACCU-CHEK GUIDE test strip, Use as directed-patient needs an appt, Disp: 30 each, Rfl: 0   Accu-Chek Softclix Lancets lancets, Use as directed, Disp: 100 each, Rfl: 0   amLODipine (NORVASC) 5 MG tablet, Take 1 tablet (5 mg total) by mouth daily., Disp: 30 tablet, Rfl: 3   blood glucose meter kit and supplies KIT, Dispense based on patient and insurance preference. Use up to four times daily as directed., Disp: 1 each, Rfl: 0   Continuous Glucose Sensor (FREESTYLE LIBRE 3 PLUS SENSOR) MISC, Place 1 Application onto the skin every 14 (fourteen) days., Disp: 2 each, Rfl: 11   gabapentin (NEURONTIN) 100 MG capsule, Take 100 mg by mouth 3 (three) times daily., Disp: , Rfl:    insulin lispro (HUMALOG) 100 UNIT/ML injection, Inject 2 Units into the skin as needed., Disp: , Rfl:    mycophenolate (MYFORTIC) 360 MG TBEC EC tablet, Take 720 mg by mouth 2 (two) times daily., Disp: , Rfl:    ondansetron  (ZOFRAN-ODT) 8 MG disintegrating tablet, Take 1 tablet (8 mg total) by mouth every 8 (eight) hours as needed for nausea or vomiting., Disp: 12 tablet, Rfl: 0   pravastatin (PRAVACHOL) 40 MG tablet, Take 40 mg by mouth at bedtime., Disp: , Rfl:    predniSONE (DELTASONE) 5 MG tablet, Take 5 mg by mouth daily with breakfast., Disp: , Rfl:    RELION PEN NEEDLES 31G X 6 MM MISC, , Disp: , Rfl:    sildenafil (VIAGRA) 50 MG tablet, Take 1 tablet (50 mg total) by mouth daily as needed for erectile dysfunction., Disp: 10 tablet, Rfl: 2   tacrolimus (PROGRAF) 1 MG capsule, Take by mouth. 9 tablets twice a day, Disp: ,  Rfl:    valACYclovir (VALTREX) 500 MG tablet, Take 500 mg by mouth 2 (two) times daily., Disp: , Rfl:    BREZTRI AEROSPHERE 160-9-4.8 MCG/ACT AERO, Inhale 2 puffs into the lungs 2 (two) times daily., Disp: 10.7 g, Rfl: 5  Review of Systems:  Negative unless indicated in HPI.   Physical Exam: Vitals:   12/25/23 1347 12/25/23 1402  BP: 128/86 122/85  Pulse: (!) 107   Temp: 98.2 F (36.8 C)   TempSrc: Oral   SpO2: 97%   Weight: 215 lb 1 oz (97.6 kg)   Height: 6' (1.829 m)     Body mass index is 29.17 kg/m.   Physical Exam Vitals reviewed.  Constitutional:      Appearance: Normal appearance.  HENT:     Head: Normocephalic and atraumatic.  Eyes:     Conjunctiva/sclera: Conjunctivae normal.  Cardiovascular:     Rate and Rhythm: Normal rate and regular rhythm.  Pulmonary:     Effort: Pulmonary effort is normal.     Breath sounds: Normal breath sounds.  Skin:    General: Skin is warm and dry.  Neurological:     General: No focal deficit present.     Mental Status: He is alert and oriented to person, place, and time.  Psychiatric:        Mood and Affect: Mood normal.        Behavior: Behavior normal.        Thought Content: Thought content normal.        Judgment: Judgment normal.      Impression and Plan:  Essential hypertension  Chronic obstructive pulmonary  disease, unspecified COPD type (HCC) -     Breztri Aerosphere; Inhale 2 puffs into the lungs 2 (two) times daily.  Dispense: 10.7 g; Refill: 5   -2 separate blood pressure measurements in office today are coming down, 122/85 and 128/86.  He was started on amlodipine 2 days ago in the ED, advised to continue and to reach out to his transplant coordinator.  Time spent:22 minutes reviewing chart, interviewing and examining patient and formulating plan of care.     Chaya Jan, MD [facility] at [facility]

## 2023-12-25 NOTE — Transitions of Care (Post Inpatient/ED Visit) (Signed)
12/25/2023  Name: Bruce Robertson MRN: [id number] DOB: [birth year]  Today's TOC FU Call Status: Today's TOC FU Call Status:: Successful TOC FU Call Completed TOC FU Call Complete Date: 12/25/23 Patient's Name and Date of Birth confirmed.  Transition Care Management Follow-up Telephone Call Date of Discharge: 12/23/23 Discharge Facility: [facility] ([facility]) Type of Discharge: Emergency Department Reason for ED Visit: Other: (hypertension) How have you been since you were released from the hospital?: Same Any questions or concerns?: No  Items Reviewed: Did you receive and understand the discharge instructions provided?: Yes Medications obtained,verified, and reconciled?: Yes (Medications Reviewed) Any new allergies since your discharge?: No Dietary orders reviewed?: Yes Do you have support at home?: No  Medications Reviewed Today: Medications Reviewed Today     Reviewed by Karena Addison, LPN (Licensed Practical Nurse) on 12/25/23 at 1108  Med List Status: <None>   Medication Order Taking? Sig Documenting Provider Last Dose Status Informant  ACCU-CHEK GUIDE test strip [id number] No Use as directed-patient needs an appt Karie Georges, MD Taking Active Self  Accu-Chek Softclix Lancets lancets [id number] No Use as directed Karie Georges, MD Taking Active Self  amLODipine (NORVASC) 5 MG tablet [id number]  Take 1 tablet (5 mg total) by mouth daily. Melene Plan, DO  Active   blood glucose meter kit and supplies KIT [id number] No Dispense based on patient and insurance preference. Use up to four times daily as directed. Wynn Banker, MD Taking Active Self  BREZTRI AEROSPHERE 160-9-4.8 MCG/ACT Sandrea Matte [id number]  Inhale 2 puffs into the lungs 2 (two) times daily. Karie Georges, MD  Active   Continuous Glucose Sensor (FREESTYLE LIBRE 3 PLUS SENSOR) [place] [id number]  Place 1 Application onto the skin every 14 (fourteen) days. Karie Georges, MD  Active   gabapentin  (NEURONTIN) 100 MG capsule [id number] No Take 100 mg by mouth 3 (three) times daily. [provider] Taking Active   insulin lispro (HUMALOG) 100 UNIT/ML injection [id number] No Inject 2 Units into the skin as needed. [provider] Taking Active   mycophenolate (MYFORTIC) 360 MG TBEC EC tablet [id number] No Take 720 mg by mouth 2 (two) times daily. [provider] Taking Active   ondansetron (ZOFRAN-ODT) 8 MG disintegrating tablet [id number] No Take 1 tablet (8 mg total) by mouth every 8 (eight) hours as needed for nausea or vomiting. Linwood Dibbles, MD Taking Active   pravastatin (PRAVACHOL) 40 MG tablet [id number] No Take 40 mg by mouth at bedtime.  Patient not taking: Reported on 12/10/2023   [provider] Not Taking Active   predniSONE (DELTASONE) 5 MG tablet [id number] No Take 5 mg by mouth daily with breakfast. [provider] Taking Active   RELION PEN NEEDLES 31G X 6 MM MISC [id number] No  [provider] Taking Active   sildenafil (VIAGRA) 50 MG tablet [id number]  Take 1 tablet (50 mg total) by mouth daily as needed for erectile dysfunction. Karie Georges, MD  Active   tacrolimus (PROGRAF) 1 MG capsule [id number] No Take by mouth. 9 tablets twice a day [provider] Taking Active   valACYclovir (VALTREX) 500 MG tablet [id number] No Take 500 mg by mouth 2 (two) times daily.  Patient not taking: Reported on 12/10/2023   [provider] Not Taking Active             Home Care and Equipment/Supplies: Were Home Health Services Ordered?: NA Any new equipment or medical supplies ordered?: NA  Functional Questionnaire: Do you  need assistance with bathing/showering or dressing?: No Do you need assistance with meal preparation?: No Do you need assistance with eating?: No Do you have difficulty maintaining continence: No Do you need assistance with getting out of bed/getting out of a chair/moving?: No Do you have difficulty  managing or taking your medications?: No  Follow up appointments reviewed: PCP Follow-up appointment confirmed?: Yes Date of PCP follow-up appointment?: 12/25/23 Follow-up Provider: [facility] Follow-up appointment confirmed?: NA Do you need transportation to your follow-up appointment?: No Do you understand care options if your condition(s) worsen?: Yes-patient verbalized understanding    SIGNATURE Karena Addison, LPN [facility] Nurse Health Advisor Direct Dial [phone]

## 2023-12-25 NOTE — Telephone Encounter (Signed)
Chief Complaint: Elevated blood pressures Symptoms: Headache, fatigue Frequency: Ongoing Pertinent Negatives: Patient denies chest pain, difficulty breathing, blurred vision Disposition: [] ED /[] Urgent Care (no appt availability in office) / [x] Appointment(In office/virtual)/ []  [facility]/ [] Home Care/ [] Refused Recommended Disposition /[] [facility]/ []  Follow-up with PCP Additional Notes: Pt states his BP has been elevated the past couple days. Pt started checking it on 2/18. Pt checked his BP on the phone and it is currently 139/103. Pt states he started checking it after his physical where it was 160s/120s. Pt states the lowest the DBP has been is 102. Pt scheduled for an OV today. This RN educated pt on home care, new-worsening symptoms, when to call back/seek emergent care. Pt verbalized understanding and agrees to plan.   Copied from CRM [id number]. Topic: Clinical - Red Word Triage >> Dec 25, 2023 10:48 AM Gurney Maxin H wrote: [facility] that prompted transfer to Nurse Triage: Patient states blood pressure has been up last few days 153/120 170/112 165/115. Bottom number has been over 105 last 2 days, and patient has a headache. Reason for Disposition  Systolic BP  >= 160 OR Diastolic >= 100  Answer Assessment - Initial Assessment Questions 1. BLOOD PRESSURE: "What is the blood pressure?" "Did you take at least two measurements 5 minutes apart?"     Currently 139/103, 30 mins ago 142/120 2. ONSET: "When did you take your blood pressure?"     While on phone call 3. HOW: "How did you take your blood pressure?" (e.g., automatic home BP monitor, visiting nurse)     Automatic home BP monitor 4. HISTORY: "Do you have a history of high blood pressure?"     Yes 5. MEDICINES: "Are you taking any medicines for blood pressure?" "Have you missed any doses recently?"     Used to take lisinopril but was taken off; not sure why medication was stopped 6. OTHER SYMPTOMS: "Do you have  any symptoms?" (e.g., blurred vision, chest pain, difficulty breathing, headache, weakness)     Headache, fatigue  Protocols used: Blood Pressure - High-A-AH

## 2023-12-29 ENCOUNTER — Telehealth: Payer: Self-pay

## 2023-12-29 NOTE — Telephone Encounter (Signed)
 Copied from CRM [id number]. Topic: Clinical - Prescription Issue >> Dec 25, 2023 10:51 AM Gurney Maxin H wrote: Reason for CRM: Patient is calling following up on refill request for his BREZTRI AEROSPHERE 160-9-4.8 MCG/ACT AERO, system shows medication sent and discontinued medication on 2/05, please clarify and reach out to patient, thanks.  Lajarvis [phone]

## 2023-12-29 NOTE — Telephone Encounter (Signed)
 Spoke with Elder Love at [facility] and informed her of the message below as the Rx was sent by Dr Ardyth Harps on 2/20.  She stated the patient picked up the Rx today.

## 2024-01-07 ENCOUNTER — Other Ambulatory Visit: Payer: Self-pay

## 2024-01-07 ENCOUNTER — Emergency Department (HOSPITAL_BASED_OUTPATIENT_CLINIC_OR_DEPARTMENT_OTHER)
Admission: EM | Admit: 2024-01-07 | Discharge: 2024-01-07 | Disposition: A | Discharge status: Home / Self Care | Attending: Emergency Medicine | Admitting: Emergency Medicine

## 2024-01-07 ENCOUNTER — Emergency Department (HOSPITAL_BASED_OUTPATIENT_CLINIC_OR_DEPARTMENT_OTHER)

## 2024-01-07 DIAGNOSIS — E1122 Type 2 diabetes mellitus with diabetic chronic kidney disease: Secondary | ICD-10-CM | POA: Insufficient documentation

## 2024-01-07 DIAGNOSIS — R109 Unspecified abdominal pain: Secondary | ICD-10-CM | POA: Diagnosis not present

## 2024-01-07 DIAGNOSIS — N3289 Other specified disorders of bladder: Secondary | ICD-10-CM | POA: Diagnosis not present

## 2024-01-07 DIAGNOSIS — Z794 Long term (current) use of insulin: Secondary | ICD-10-CM | POA: Diagnosis not present

## 2024-01-07 DIAGNOSIS — J449 Chronic obstructive pulmonary disease, unspecified: Secondary | ICD-10-CM | POA: Diagnosis not present

## 2024-01-07 DIAGNOSIS — K2970 Gastritis, unspecified, without bleeding: Secondary | ICD-10-CM | POA: Diagnosis not present

## 2024-01-07 DIAGNOSIS — I129 Hypertensive chronic kidney disease with stage 1 through stage 4 chronic kidney disease, or unspecified chronic kidney disease: Secondary | ICD-10-CM | POA: Diagnosis not present

## 2024-01-07 DIAGNOSIS — N183 Chronic kidney disease, stage 3 unspecified: Secondary | ICD-10-CM | POA: Diagnosis not present

## 2024-01-07 DIAGNOSIS — R1013 Epigastric pain: Secondary | ICD-10-CM | POA: Diagnosis present

## 2024-01-07 DIAGNOSIS — I10 Essential (primary) hypertension: Secondary | ICD-10-CM | POA: Diagnosis not present

## 2024-01-07 DIAGNOSIS — Z79899 Other long term (current) drug therapy: Secondary | ICD-10-CM | POA: Diagnosis not present

## 2024-01-07 DIAGNOSIS — R112 Nausea with vomiting, unspecified: Secondary | ICD-10-CM | POA: Diagnosis not present

## 2024-01-07 LAB — COMPREHENSIVE METABOLIC PANEL
ALT: 30 U/L (ref 0–44)
AST: 28 U/L (ref 15–41)
Albumin: 5.1 g/dL — ABNORMAL HIGH (ref 3.5–5.0)
Alkaline Phosphatase: 55 U/L (ref 38–126)
Anion gap: 12 (ref 5–15)
BUN: 31 mg/dL — ABNORMAL HIGH (ref 8–23)
CO2: 24 mmol/L (ref 22–32)
Calcium: 11.3 mg/dL — ABNORMAL HIGH (ref 8.9–10.3)
Chloride: 99 mmol/L (ref 98–111)
Creatinine, Ser: 1.47 mg/dL — ABNORMAL HIGH (ref 0.61–1.24)
GFR, Estimated: 54 mL/min — ABNORMAL LOW (ref >60)
Glucose, Bld: 126 mg/dL — ABNORMAL HIGH (ref 70–99)
Potassium: 4.3 mmol/L (ref 3.5–5.1)
Sodium: 135 mmol/L (ref 135–145)
Total Bilirubin: 0.8 mg/dL (ref 0.0–1.2)
Total Protein: 8.2 g/dL — ABNORMAL HIGH (ref 6.5–8.1)

## 2024-01-07 LAB — CBC
HCT: 51.9 % (ref 39.0–52.0)
Hemoglobin: 16.8 g/dL (ref 13.0–17.0)
MCH: 26.7 pg (ref 26.0–34.0)
MCHC: 32.4 g/dL (ref 30.0–36.0)
MCV: 82.4 fL (ref 80.0–100.0)
Platelets: 169 10*3/uL (ref 150–400)
RBC: 6.3 MIL/uL — ABNORMAL HIGH (ref 4.22–5.81)
RDW: 14.2 % (ref 11.5–15.5)
WBC: 6.1 10*3/uL (ref 4.0–10.5)
nRBC: 0 % (ref 0.0–0.2)

## 2024-01-07 LAB — URINALYSIS, ROUTINE W REFLEX MICROSCOPIC
Bacteria, UA: NONE SEEN
Bilirubin Urine: NEGATIVE
Glucose, UA: NEGATIVE mg/dL
Ketones, ur: 15 mg/dL — AB
Leukocytes,Ua: NEGATIVE
Nitrite: NEGATIVE
Protein, ur: 30 mg/dL — AB
Specific Gravity, Urine: 1.025 (ref 1.005–1.030)
pH: 5.5 (ref 5.0–8.0)

## 2024-01-07 LAB — LIPASE, BLOOD: Lipase: 36 U/L (ref 11–51)

## 2024-01-07 LAB — TROPONIN I (HIGH SENSITIVITY): Troponin I (High Sensitivity): 9 ng/L (ref <18)

## 2024-01-07 MED ORDER — METOCLOPRAMIDE HCL 5 MG/ML IJ SOLN
5.0000 mg | Freq: Once | INTRAMUSCULAR | Status: AC
  Administered 2024-01-07: 5 mg via INTRAVENOUS
  Filled 2024-01-07: qty 2

## 2024-01-07 MED ORDER — LOPERAMIDE HCL 2 MG PO CAPS: 2.0000 mg | ORAL_CAPSULE | Freq: Four times a day (QID) | ORAL | 0 refills | Status: AC | PRN

## 2024-01-07 MED ORDER — SODIUM CHLORIDE 0.9 % IV BOLUS
500.0000 mL | Freq: Once | INTRAVENOUS | Status: AC
  Administered 2024-01-07: 500 mL via INTRAVENOUS

## 2024-01-07 MED ORDER — LOPERAMIDE HCL 2 MG PO CAPS: 2.0000 mg | ORAL_CAPSULE | Freq: Four times a day (QID) | ORAL | 0 refills | Status: DC | PRN

## 2024-01-07 MED ORDER — ONDANSETRON 4 MG PO TBDP
4.0000 mg | ORAL_TABLET | Freq: Once | ORAL | Status: AC | PRN
  Administered 2024-01-07: 4 mg via ORAL
  Filled 2024-01-07: qty 1

## 2024-01-07 MED ORDER — ONDANSETRON 4 MG PO TBDP: 4.0000 mg | ORAL_TABLET | Freq: Three times a day (TID) | ORAL | 0 refills | Status: DC | PRN

## 2024-01-07 MED ORDER — MORPHINE SULFATE (PF) 4 MG/ML IV SOLN
4.0000 mg | Freq: Once | INTRAVENOUS | Status: AC
  Administered 2024-01-07: 4 mg via INTRAVENOUS
  Filled 2024-01-07: qty 1

## 2024-01-07 MED ORDER — PANTOPRAZOLE SODIUM 40 MG PO TBEC: 40.0000 mg | DELAYED_RELEASE_TABLET | Freq: Every day | ORAL | 0 refills | Status: DC

## 2024-01-07 MED ORDER — IOHEXOL 300 MG/ML  SOLN
100.0000 mL | Freq: Once | INTRAMUSCULAR | Status: AC | PRN
  Administered 2024-01-07: 100 mL via INTRAVENOUS

## 2024-01-07 NOTE — Discharge Instructions (Addendum)
 Had recommended for and as needed for nausea or vomiting.  I would recommend Imodium for diarrhea.  Make sure staying well-hydrated with water he can alternate Pedialyte and Gatorade.  Try bland foods like bananas and toast.  Have sent pantoprazole to your pharmacy for gastritis.

## 2024-01-07 NOTE — ED Provider Notes (Signed)
 [place] EMERGENCY DEPARTMENT AT [facility] Provider Note   CSN: [id number] Arrival date & time: 01/07/24  1621     History  Chief Complaint  Patient presents with   Abdominal Pain    Bruce Robertson is a 63 y.o. 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  He reports approximately 6 episodes of vomiting nonbloody nonbilious for 3 days.  He also reports 2-3 episodes of loose stool for the past 3 days.  He has not noted any blood or mucus in stool. Reports he has not been able to eat anything for the past 3 days.  Reports he feels very shaky secondary to being dehydrated. Reports zofran helped a little.  Denies any recent antibiotic use.  Denies any recent travel.  Denies being around any sick contacts.  Denies eating suspicious foods.  Denies chest pain, shortness of breath, fevers, chills, cough. On immunosuppressive therapy.    Abdominal Pain      Home Medications Prior to Admission medications   Medication Sig Start Date End Date Taking? Authorizing Provider  ACCU-CHEK GUIDE test strip Use as directed-patient needs an appt 08/12/22   Karie Georges, MD  Accu-Chek Softclix Lancets lancets Use as directed 08/13/22   Karie Georges, MD  amLODipine (NORVASC) 5 MG tablet Take 1 tablet (5 mg total) by mouth daily. 12/24/23   Melene Plan, DO  blood glucose meter kit and supplies KIT Dispense based on patient and insurance preference. Use up to four times daily as directed. 02/14/20   Koberlein, Paris Lore, MD  BREZTRI AEROSPHERE 160-9-4.8 MCG/ACT AERO Inhale 2 puffs into the lungs 2 (two) times daily. 12/25/23   Philip Aspen, Limmie Patricia, MD  Continuous Glucose Sensor (FREESTYLE LIBRE 3 PLUS SENSOR) MISC Place 1 Application  onto the skin every 14 (fourteen) days. 12/10/23   Karie Georges, MD  gabapentin (NEURONTIN) 100 MG capsule Take 100 mg by mouth 3 (three) times daily. 11/27/23   [provider]  insulin lispro (HUMALOG) 100 UNIT/ML injection Inject 2 Units into the skin as needed.    [provider]  mycophenolate (MYFORTIC) 360 MG TBEC EC tablet Take 720 mg by mouth 2 (two) times daily. 10/12/22   [provider]  ondansetron (ZOFRAN-ODT) 8 MG disintegrating tablet Take 1 tablet (8 mg total) by mouth every 8 (eight) hours as needed for nausea or vomiting. 02/26/23   Linwood Dibbles, MD  pravastatin (PRAVACHOL) 40 MG tablet Take 40 mg by mouth at bedtime.    [provider]  predniSONE (DELTASONE) 5 MG tablet Take 5 mg by mouth daily with breakfast.    [provider]  RELION PEN NEEDLES 31G X 6 MM MISC  07/09/23   [provider]  sildenafil (VIAGRA) 50 MG tablet Take 1 tablet (50 mg total) by mouth daily as needed for erectile dysfunction. 12/10/23   Karie Georges, MD  tacrolimus (PROGRAF) 1 MG capsule Take by mouth. 9 tablets twice a day 12/05/23   [provider]  valACYclovir (VALTREX) 500 MG tablet Take 500 mg by mouth 2 (two) times daily.    [provider]      Allergies    Patient has no known allergies.    Review of Systems   Review of Systems  Gastrointestinal:  Positive for  abdominal pain.    Physical Exam Updated Vital Signs BP (!) 135/101   Pulse (!) 109   Temp (!) 96.9 F (36.1 C)   Resp 15   Ht 6' (1.829 m)   Wt 98.4 kg   SpO2 100%   BMI 29.43 kg/m  Physical Exam  ED Results / Procedures / Treatments   Labs (all labs ordered are listed, but only abnormal results are displayed) Labs Reviewed  COMPREHENSIVE METABOLIC PANEL - Abnormal; Notable for the following components:      Result Value   Glucose, Bld 126 (*)    BUN 31 (*)    Creatinine, Ser 1.47 (*)    Calcium 11.3 (*)    Total Protein 8.2 (*)     Albumin 5.1 (*)    GFR, Estimated 54 (*)    All other components within normal limits  CBC - Abnormal; Notable for the following components:   RBC 6.30 (*)    All other components within normal limits  URINALYSIS, ROUTINE W REFLEX MICROSCOPIC - Abnormal; Notable for the following components:   Hgb urine dipstick TRACE (*)    Ketones, ur 15 (*)    Protein, ur 30 (*)    All other components within normal limits  LIPASE, BLOOD    EKG EKG Interpretation Date/Time:  Wednesday January 07 2024 20:15:10 EST Ventricular Rate:  99 PR Interval:  135 QRS Duration:  91 QT Interval:  349 QTC Calculation: 448 R Axis:   83  Text Interpretation: Sinus rhythm Atrial premature complex Probable left atrial enlargement Borderline right axis deviation Borderline T abnormalities, anterior leads Confirmed by Vonita Moss [phone]) on 01/07/2024 10:30:45 PM  Radiology CT ABDOMEN PELVIS W CONTRAST Result Date: 01/07/2024 CLINICAL DATA:  Abdominal pain with nausea and vomiting for several days, initial encounter EXAM: CT ABDOMEN AND PELVIS WITH CONTRAST TECHNIQUE: Multidetector CT imaging of the abdomen and pelvis was performed using the standard protocol following bolus administration of intravenous contrast. RADIATION DOSE REDUCTION: This exam was performed according to the departmental dose-optimization program which includes automated exposure control, adjustment of the mA and/or kV according to patient size and/or use of iterative reconstruction technique. CONTRAST:  OMNIPAQUE IOHEXOL 300 MG/ML  SOLN COMPARISON:  08/16/2023 FINDINGS: Lower chest: No acute abnormality. Hepatobiliary: Liver is within normal limits. Gallbladder is well distended. No cholelithiasis or obstructive changes are seen. Pancreas: Unremarkable. No pancreatic ductal dilatation or surrounding inflammatory changes. Spleen: Normal in size without focal abnormality. Adrenals/Urinary Tract: Adrenal glands are within normal limits. Kidneys  demonstrate a normal enhancement pattern bilaterally. Left renal cysts are seen stable from the prior exam. No further follow-up is recommended. No obstructive changes are seen. The bladder is partially distended. Stomach/Bowel: The appendix is within normal limits. No obstructive or inflammatory changes of the colon seen. Small bowel is within normal limits. Small hiatal hernia is noted. The gastric mucosa is somewhat thickened when compared with the prior exam and may be related to a degree of gastritis. No focal ulcer is seen. Vascular/Lymphatic: Aortic atherosclerosis. No enlarged abdominal or pelvic lymph nodes. Reproductive: Prostate is unremarkable. Other: No abdominal wall hernia or abnormality. No abdominopelvic ascites. Musculoskeletal: No acute or significant osseous findings. IMPRESSION: Thickening of the gastric mucosa with mild hyperemia which may represent a degree of gastritis. No other focal abnormality is noted. Electronically Signed   By: Alcide Clever M.D.   On: 01/07/2024 22:59    Procedures Procedures    Medications Ordered in ED Medications  ondansetron (  ZOFRAN-ODT) disintegrating tablet 4 mg (4 mg Oral Given 01/07/24 1640)  sodium chloride 0.9 % bolus 500 mL (0 mLs Intravenous Stopped 01/07/24 2201)  morphine (PF) 4 MG/ML injection 4 mg (4 mg Intravenous Given 01/07/24 2022)  metoCLOPramide (REGLAN) injection 5 mg (5 mg Intravenous Given 01/07/24 2019)  iohexol (OMNIPAQUE) 300 MG/ML solution 100 mL (100 mLs Intravenous Contrast Given 01/07/24 2031)    ED Course/ Medical Decision Making/ A&P                                 Medical Decision Making Amount and/or Complexity of Data Reviewed Labs: ordered. Radiology: ordered.  Risk Prescription drug management.   Abdishakur Gottschall 63 y.o. presented today for abd pain. Working DDx includes, but not limited to, gastroenteritis, colitis, SBO, appendicitis, cholecystitis, hepatobiliary pathology, gastritis, PUD, ACS, dissection,  pancreatitis, nephrolithiasis, AAA, UTI, pyelonephritis, torsion.   R/o DDx: These are considered less likely than current impression due to history of present illness, physical exam, labs/imaging findings.  Review of prior external notes: TTE with preserved LV function 09/2023 no significant valvular disease per [facility] note.   PMHX: heart transplant in 2023 for nonischemic cardiomyopathy, COPD, diabetes, hypertension, hyperlipidemia, stage III chronic kidney disease   CBC without leukocytosis and no anemia CMP without significant electrolyte abnormality.  BUN, creatinine and GFR at baseline in comparison to prior recent labs.  No anion gap. Lipase 36 Troponin 9, given duration of symptoms and lack of chest pain do not feel we need to repeat UA with trace ketones and trace protein likely consistent with a mild dehydration  Problem List / ED Course / Critical interventions / Medication management  Patient reporting to emergency room with abdominal pain nausea vomiting diarrhea.  Symptoms consistent with gastroenteritis.  His pain is in upper abdomen and reproducible on presentation however given history will rule out ACS with EKG and troponin.  Will also give saline, Zofran and morphine for pain and nausea.  Labs are at patient's baseline.  Given reproducible tenderness on palpation will obtain CT imaging to rule out acute abnormality.  He is hemodynamically stable and well-appearing. Patient reassessed and p.o. challenge after CT scan.  CT scan without acute abnormality.  Patient passed p.o. challenge and reports he is feeling better.  Given labs are at baseline and reassuring workup.  Feel he is appropriate for discharge and close outpatient follow-up.  Symptoms consistent with gastritis. I ordered medication including zofran.  Morphine, Reglan, normal saline. Reevaluation of the patient after these medicines showed that the patient improved Patients vitals assessed. Upon arrival patient is  hemodynamically stable.  I have reviewed the patients home medicines and have made adjustments as needed  Plan:  F/u w/ PCP in 2-3d to ensure resolution of sx.  Patient was given return precautions. Patient stable for discharge at this time.  Patient educated on current sx/dx and verbalized understanding of plan. Return to ER w/ new or worsening sx.          Final Clinical Impression(s) / ED Diagnoses Final diagnoses:  Gastritis without bleeding, unspecified chronicity, unspecified gastritis type    Rx / DC Orders ED Discharge Orders          Ordered    pantoprazole (PROTONIX) 40 MG tablet  Daily,   Status:  Discontinued        01/07/24 2304    ondansetron (ZOFRAN-ODT) 4 MG disintegrating tablet  Every 8 hours PRN,  Status:  Discontinued        01/07/24 2315    loperamide (IMODIUM) 2 MG capsule  4 times daily PRN,   Status:  Discontinued        01/07/24 2315    loperamide (IMODIUM) 2 MG capsule  4 times daily PRN        01/07/24 2332    ondansetron (ZOFRAN-ODT) 4 MG disintegrating tablet  Every 8 hours PRN        01/07/24 2332    pantoprazole (PROTONIX) 40 MG tablet  Daily        01/07/24 2332              Uchenna Rappaport, Horald Chestnut, PA-C 01/08/24 1927    Rondel Baton, MD 01/14/24 [phone]

## 2024-01-07 NOTE — ED Notes (Signed)
 Spoke w/ lab about troponin add-on

## 2024-01-07 NOTE — ED Triage Notes (Signed)
 Pt POV reporting abd and nvd x3 days, unable to keep anything down. Denies fever.

## 2024-02-13 DIAGNOSIS — E118 Type 2 diabetes mellitus with unspecified complications: Secondary | ICD-10-CM | POA: Diagnosis not present

## 2024-02-21 ENCOUNTER — Encounter (HOSPITAL_BASED_OUTPATIENT_CLINIC_OR_DEPARTMENT_OTHER): Payer: Self-pay

## 2024-02-21 ENCOUNTER — Emergency Department (HOSPITAL_BASED_OUTPATIENT_CLINIC_OR_DEPARTMENT_OTHER)

## 2024-02-21 ENCOUNTER — Other Ambulatory Visit: Payer: Self-pay

## 2024-02-21 ENCOUNTER — Emergency Department (HOSPITAL_BASED_OUTPATIENT_CLINIC_OR_DEPARTMENT_OTHER)
Admission: EM | Admit: 2024-02-21 | Discharge: 2024-02-21 | Disposition: A | Discharge status: Home / Self Care | Attending: Emergency Medicine | Admitting: Emergency Medicine

## 2024-02-21 DIAGNOSIS — I509 Heart failure, unspecified: Secondary | ICD-10-CM | POA: Diagnosis not present

## 2024-02-21 DIAGNOSIS — Z79899 Other long term (current) drug therapy: Secondary | ICD-10-CM | POA: Diagnosis not present

## 2024-02-21 DIAGNOSIS — M79602 Pain in left arm: Secondary | ICD-10-CM | POA: Diagnosis present

## 2024-02-21 DIAGNOSIS — X58XXXA Exposure to other specified factors, initial encounter: Secondary | ICD-10-CM | POA: Diagnosis not present

## 2024-02-21 DIAGNOSIS — M19012 Primary osteoarthritis, left shoulder: Secondary | ICD-10-CM | POA: Diagnosis not present

## 2024-02-21 DIAGNOSIS — E119 Type 2 diabetes mellitus without complications: Secondary | ICD-10-CM | POA: Insufficient documentation

## 2024-02-21 DIAGNOSIS — S46112A Strain of muscle, fascia and tendon of long head of biceps, left arm, initial encounter: Secondary | ICD-10-CM | POA: Insufficient documentation

## 2024-02-21 DIAGNOSIS — I2510 Atherosclerotic heart disease of native coronary artery without angina pectoris: Secondary | ICD-10-CM | POA: Insufficient documentation

## 2024-02-21 DIAGNOSIS — S46912A Strain of unspecified muscle, fascia and tendon at shoulder and upper arm level, left arm, initial encounter: Secondary | ICD-10-CM | POA: Diagnosis not present

## 2024-02-21 DIAGNOSIS — S4992XA Unspecified injury of left shoulder and upper arm, initial encounter: Secondary | ICD-10-CM | POA: Diagnosis not present

## 2024-02-21 MED ORDER — IBUPROFEN 800 MG PO TABS
800.0000 mg | ORAL_TABLET | Freq: Once | ORAL | Status: AC
  Administered 2024-02-21: 800 mg via ORAL
  Filled 2024-02-21: qty 1

## 2024-02-21 MED ORDER — CYCLOBENZAPRINE HCL 10 MG PO TABS: 10.0000 mg | ORAL_TABLET | Freq: Two times a day (BID) | ORAL | 0 refills | Status: AC | PRN

## 2024-02-21 MED ORDER — IBUPROFEN 600 MG PO TABS: 600.0000 mg | ORAL_TABLET | Freq: Four times a day (QID) | ORAL | 0 refills | Status: AC | PRN
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
## 2024-02-21 NOTE — Discharge Instructions (Addendum)
 You have been evaluated for your injury.  X-ray of your left upper arm did not show any broken bone or dislocation.  Your bruising and pain is likely due to a muscle tear versus a muscle strain.  It will improve over time.  Avoid extraneous activity, continue to wear your arm compression for support.  You may take anti-inflammatory medication and muscle relaxant as needed.  Follow-up with orthopedic specialist for further care if your symptoms persist.

## 2024-02-21 NOTE — ED Notes (Signed)
 Pt alert and oriented X 4 at the time of discharge. RR even and unlabored. No acute distress noted. Pt verbalized understanding of discharge instructions as discussed. Pt ambulatory to lobby at time of discharge.
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
## 2024-02-25 DIAGNOSIS — N183 Chronic kidney disease, stage 3 unspecified: Secondary | ICD-10-CM | POA: Diagnosis not present

## 2024-02-25 DIAGNOSIS — Z79899 Other long term (current) drug therapy: Secondary | ICD-10-CM | POA: Diagnosis not present

## 2024-02-25 DIAGNOSIS — Z941 Heart transplant status: Secondary | ICD-10-CM | POA: Diagnosis not present

## 2024-02-25 DIAGNOSIS — Z2989 Encounter for other specified prophylactic measures: Secondary | ICD-10-CM | POA: Diagnosis not present

## 2024-02-25 DIAGNOSIS — D849 Immunodeficiency, unspecified: Secondary | ICD-10-CM | POA: Diagnosis not present

## 2024-02-25 DIAGNOSIS — I129 Hypertensive chronic kidney disease with stage 1 through stage 4 chronic kidney disease, or unspecified chronic kidney disease: Secondary | ICD-10-CM | POA: Diagnosis not present

## 2024-02-25 DIAGNOSIS — Z4821 Encounter for aftercare following heart transplant: Secondary | ICD-10-CM | POA: Diagnosis not present

## 2024-02-25 DIAGNOSIS — Z48298 Encounter for aftercare following other organ transplant: Secondary | ICD-10-CM | POA: Diagnosis not present

## 2024-02-25 DIAGNOSIS — Z7982 Long term (current) use of aspirin: Secondary | ICD-10-CM | POA: Diagnosis not present

## 2024-05-12 DIAGNOSIS — Z2989 Encounter for other specified prophylactic measures: Secondary | ICD-10-CM | POA: Diagnosis not present

## 2024-05-12 DIAGNOSIS — Z7982 Long term (current) use of aspirin: Secondary | ICD-10-CM | POA: Diagnosis not present

## 2024-05-12 DIAGNOSIS — D849 Immunodeficiency, unspecified: Secondary | ICD-10-CM | POA: Diagnosis not present

## 2024-05-12 DIAGNOSIS — N183 Chronic kidney disease, stage 3 unspecified: Secondary | ICD-10-CM | POA: Diagnosis not present

## 2024-05-12 DIAGNOSIS — Z48298 Encounter for aftercare following other organ transplant: Secondary | ICD-10-CM | POA: Diagnosis not present

## 2024-05-12 DIAGNOSIS — Z4821 Encounter for aftercare following heart transplant: Secondary | ICD-10-CM | POA: Diagnosis not present

## 2024-05-12 DIAGNOSIS — Z79899 Other long term (current) drug therapy: Secondary | ICD-10-CM | POA: Diagnosis not present

## 2024-05-12 DIAGNOSIS — Z941 Heart transplant status: Secondary | ICD-10-CM | POA: Diagnosis not present

## 2024-05-12 DIAGNOSIS — D84821 Immunodeficiency due to drugs: Secondary | ICD-10-CM | POA: Diagnosis not present

## 2024-05-14 DIAGNOSIS — E118 Type 2 diabetes mellitus with unspecified complications: Secondary | ICD-10-CM | POA: Diagnosis not present

## 2024-05-19 ENCOUNTER — Ambulatory Visit (INDEPENDENT_AMBULATORY_CARE_PROVIDER_SITE_OTHER)

## 2024-05-19 VITALS — BP 120/60 | Temp 98.3°F | Ht 72.0 in | Wt 202.6 lb

## 2024-05-19 DIAGNOSIS — Z0000 Encounter for general adult medical examination without abnormal findings: Secondary | ICD-10-CM

## 2024-05-19 NOTE — Progress Notes (Signed)
 Subjective:   Bruce Robertson is a 63 y.o. who presents for a Medicare Wellness preventive visit.  As a reminder, Annual Wellness Visits don't include a physical exam, and some assessments may be limited, especially if this visit is performed virtually. We may recommend an in-person follow-up visit with your provider if needed.  Visit Complete: In person    Persons Participating in Visit: Patient.  AWV Questionnaire: No: Patient Medicare AWV questionnaire was not completed prior to this visit.  Cardiac Risk Factors include: advanced age (>79men, >32 women);male gender;diabetes mellitus;hypertension     Objective:    Today's Vitals   05/19/24 0853  BP: 120/60  Temp: 98.3 F (36.8 C)  TempSrc: Oral  Weight: 202 lb 9.6 oz (91.9 kg)  Height: 6' (1.829 m)   Body mass index is 27.48 kg/m.     05/19/2024    9:23 AM 01/07/2024    4:34 PM 12/24/2023    5:16 PM 08/16/2023   10:07 AM 08/01/2023    3:14 PM 02/26/2023   12:03 PM 12/20/2022   10:21 AM  Advanced Directives  Does Patient Have a Medical Advance Directive? Yes No No No No No No  Type of Estate agent of [place];Living will        Copy of Healthcare Power of Attorney in Chart? No - copy requested        Would patient like information on creating a medical advance directive?    No - Patient declined  No - Patient declined No - Patient declined    Current Medications (verified) Outpatient Encounter Medications as of 05/19/2024  Medication Sig   ACCU-CHEK GUIDE test strip Use as directed-patient needs an appt   Accu-Chek Softclix Lancets lancets Use as directed   amLODipine  (NORVASC ) 5 MG tablet Take 1 tablet (5 mg total) by mouth daily.   blood glucose meter kit and supplies KIT Dispense based on patient and insurance preference. Use up to four times daily as directed.   BREZTRI  AEROSPHERE 160-9-4.8 MCG/ACT AERO Inhale 2 puffs into the lungs 2 (two) times daily.   Continuous Glucose Sensor  (FREESTYLE LIBRE 3 PLUS SENSOR) MISC Place 1 Application onto the skin every 14 (fourteen) days.   cyclobenzaprine  (FLEXERIL ) 10 MG tablet Take 1 tablet (10 mg total) by mouth 2 (two) times daily as needed for muscle spasms.   gabapentin  (NEURONTIN ) 100 MG capsule Take 100 mg by mouth 3 (three) times daily.   ibuprofen  (ADVIL ) 600 MG tablet Take 1 tablet (600 mg total) by mouth every 6 (six) hours as needed.   insulin  lispro (HUMALOG) 100 UNIT/ML injection Inject 2 Units into the skin as needed.   loperamide  (IMODIUM ) 2 MG capsule Take 1 capsule (2 mg total) by mouth 4 (four) times daily as needed for diarrhea or loose stools.   mycophenolate (MYFORTIC) 360 MG TBEC EC tablet Take 720 mg by mouth 2 (two) times daily.   ondansetron  (ZOFRAN -ODT) 4 MG disintegrating tablet Take 1 tablet (4 mg total) by mouth every 8 (eight) hours as needed for nausea or vomiting.   pantoprazole  (PROTONIX ) 40 MG tablet Take 1 tablet (40 mg total) by mouth daily.   pravastatin (PRAVACHOL) 40 MG tablet Take 40 mg by mouth at bedtime. (Patient not taking: Reported on 05/19/2024)   predniSONE  (DELTASONE ) 5 MG tablet Take 5 mg by mouth daily with breakfast.   RELION PEN NEEDLES 31G X 6 MM MISC    sildenafil  (VIAGRA ) 50 MG tablet Take 1 tablet (50  mg total) by mouth daily as needed for erectile dysfunction.   tacrolimus  (PROGRAF ) 1 MG capsule Take by mouth. 9 tablets twice a day   valACYclovir  (VALTREX ) 500 MG tablet Take 500 mg by mouth 2 (two) times daily. (Patient not taking: Reported on 05/19/2024)   No facility-administered encounter medications on file as of 05/19/2024.    Allergies (verified) Patient has no known allergies.   History: Past Medical History:  Diagnosis Date   AICD (automatic cardioverter/defibrillator) present    Anxiety 08/20/2016   Barrett's esophagus 2002   Cardiomyopathy    Nonischemic. EF has been about 45%. S/P CABG.;  b.  Echo 4/14: EF 25%, global HK with inf and mid apical AK, restrictive  physiology with E/e' > 15 (elevated LV filling pressure), trivial AI/MR, mild to mod LAE, mild RVE, mild reduced RVSF, mild RAE, mod TR, PASP 74 (severe pulmonary HTN)    CHF (congestive heart failure) (HCC)    CHF exacerbation (HCC) 07/31/2016   Colon polyps 2002   Coronary artery disease    Diabetes mellitus without complication (HCC)    Type II   Dyslipidemia    hx (03/20/2017)   GERD (gastroesophageal reflux disease)    History of bleeding peptic ulcer    History of echocardiogram    Echo 10/16:  EF 25-30%, poss non-compaction, diff HK with inf-lat and apical HK, restrictive physio, mild AI, severe LAE, mild RVE with mild reduced RVSF, PASP 42 mmHg   Hyperplastic rectal polyp    Moderate episode of recurrent major depressive disorder (HCC) 08/20/2016   09/14/20 - not current   OSA on CPAP    Mild   Pneumonia 09/2018   Tobacco abuse    Remote   Past Surgical History:  Procedure Laterality Date   CARDIAC CATHETERIZATION  2007;  2009   CARDIAC DEFIBRILLATOR PLACEMENT  03/20/2017   COLONOSCOPY WITH PROPOFOL  N/A 01/21/2019   Procedure: COLONOSCOPY WITH PROPOFOL ;  Surgeon: Teressa Toribio SQUIBB, MD;  Location: [facility];  Service: Endoscopy;  Laterality: N/A;   CORONARY ARTERY BYPASS GRAFT  2007   Had a left main dissection after catheterization. Underwent a saphenous vein graft to the LAD and a saphenous vein graft to obtuse marginal.   HEART TRANSPLANT  08/2022   HEMORRHOID SURGERY N/A 08/23/2020   Procedure: HEMORRHOIDECTOMY WITH LIGATION AND HEMORRHOIDOPEXY;  Surgeon: Sheldon Standing, MD;  Location: [facility];  Service: General;  Laterality: N/A;   ICD IMPLANT N/A 03/20/2017   Procedure: ICD Implant;  Surgeon: Inocencio Soyla Lunger, MD;  Location: [facility];  Service: Cardiovascular;  Laterality: N/A;   IR FLUORO GUIDE CV LINE RIGHT  04/29/2022   IR US  GUIDE VASC ACCESS RIGHT  04/29/2022   PATELLAR TENDON REPAIR Right 2005   PATELLAR TENDON REPAIR Right 09/21/2020    Procedure: PATELLA TENDON REPAIR;  Surgeon: Jerri Kay HERO, MD;  Location: [facility] OR;  Service: Orthopedics;  Laterality: Right;   PLACEMENT OF IMPELLA LEFT VENTRICULAR ASSIST DEVICE N/A 08/22/2022   Procedure: PLACEMENT OF IMPELLA 5.5 LEFT VENTRICULAR ASSIST DEVICE;  Surgeon: Obadiah Coy, MD;  Location: [facility];  Service: Open Heart Surgery;  Laterality: N/A;   POLYPECTOMY  01/21/2019   Procedure: POLYPECTOMY;  Surgeon: Teressa Toribio SQUIBB, MD;  Location: [facility];  Service: Endoscopy;;   RECTAL EXAM UNDER ANESTHESIA Left 08/23/2020   Procedure: RECTAL EXAM UNDER ANESTHESIA;  Surgeon: Sheldon Standing, MD;  Location: [facility];  Service: General;  Laterality: Left;   RIGHT HEART CATH N/A 04/25/2022  Procedure: RIGHT HEART CATH;  Surgeon: Cherrie Toribio SAUNDERS, MD;  Location: [facility] INVASIVE CV LAB;  Service: Cardiovascular;  Laterality: N/A;   RIGHT HEART CATH N/A 08/20/2022   Procedure: RIGHT HEART CATH;  Surgeon: Cherrie Toribio SAUNDERS, MD;  Location: [facility];  Service: Cardiovascular;  Laterality: N/A;   TEE WITHOUT CARDIOVERSION N/A 08/22/2022   Procedure: TRANSESOPHAGEAL ECHOCARDIOGRAM (TEE);  Surgeon: Obadiah Coy, MD;  [facility];  Service: Open Heart Surgery;  Laterality: N/A;   Family History  Problem Relation Age of Onset   Hypertension Mother    Osteoarthritis Mother    Heart failure Father 23   High blood pressure Father    Bipolar disorder Daughter    Diabetes Daughter    Cancer Maternal Uncle        uncertain type   Cancer Maternal Grandmother        uncertain type   Diabetes Niece    Lung cancer Other    Asthma Neg Hx    Breast cancer Neg Hx    Social History   Socioeconomic History   Marital status: Significant Other    Spouse name: Not on file   Number of children: 5   Years of education: Not on file   Highest education level: High school graduate  Occupational History   Occupation: Radio producer    Employer: Pension scheme manager    Comment: Disability 4  years  Tobacco Use   Smoking status: Former    Current packs/day: 0.25    Average packs/day: 0.3 packs/day for 7.0 years (1.8 ttl pk-yrs)    Types: Cigarettes   Smokeless tobacco: Never   Tobacco comments:    03/20/2017 quit in 1997  Vaping Use   Vaping status: Former   Substances: THC   Devices: vaped for about a month  Substance and Sexual Activity   Alcohol use: Not Currently    Comment: socially   Drug use: Yes    Frequency: 1.0 times per week    Types: Marijuana   Sexual activity: Yes  Other Topics Concern   Not on file  Social History Narrative   Single   Social Drivers of Health   Financial Resource Strain: Low Risk  (05/19/2024)   Overall Financial Resource Strain (CARDIA)    Difficulty of Paying Living Expenses: Not hard at all  Food Insecurity: No Food Insecurity (05/19/2024)   Hunger Vital Sign    Worried About Running Out of Food in the Last Year: Never true    Ran Out of Food in the Last Year: Never true  Transportation Needs: No Transportation Needs (05/19/2024)   PRAPARE - Administrator, Civil Service (Medical): No    Lack of Transportation (Non-Medical): No  Physical Activity: Sufficiently Active (05/19/2024)   Exercise Vital Sign    Days of Exercise per Week: 4 days    Minutes of Exercise per Session: 60 min  Stress: No Stress Concern Present (05/19/2024)   [employer] of Occupational Health - Occupational Stress Questionnaire    Feeling of Stress: Not at all  Social Connections: Moderately Integrated (05/19/2024)   Social Connection and Isolation Panel    Frequency of Communication with Friends and Family: More than three times a week    Frequency of Social Gatherings with Friends and Family: More than three times a week    Attends Religious Services: More than 4 times per year    Active Member of Clubs or Organizations: Yes    Attends  Club or Organization Meetings: More than 4 times per year    Marital Status: Divorced    Tobacco  Counseling Counseling given: Not Answered Tobacco comments: 03/20/2017 quit in 1997    Clinical Intake:  Pre-visit preparation completed: Yes  Pain : No/denies pain     BMI - recorded: 27.48 Nutritional Status: BMI 25 -29 Overweight Nutritional Risks: None Diabetes: Yes CBG done?: Yes (CBG 137 per patient) CBG resulted in Enter/ Edit results?: Yes Did pt. bring in CBG monitor from home?: Yes Glucose Meter Downloaded?: No  Lab Results  Component Value Date   HGBA1C 6.8 (H) 12/10/2023   HGBA1C 7.0 (H) 04/15/2022   HGBA1C 7.0 (H) 11/30/2021     How often do you need to have someone help you when you read instructions, pamphlets, or other written materials from your doctor or pharmacy?: 1 - Never  Interpreter Needed?: No  Information entered by :: Rojelio Blush LPN   Activities of Daily Living     05/19/2024    9:22 AM  In your present state of health, do you have any difficulty performing the following activities:  Hearing? 0  Vision? 0  Difficulty concentrating or making decisions? 0  Walking or climbing stairs? 0  Dressing or bathing? 0  Doing errands, shopping? 0  Preparing Food and eating ? N  Using the Toilet? N  In the past six months, have you accidently leaked urine? N  Do you have problems with loss of bowel control? N  Managing your Medications? N  Managing your Finances? N  Housekeeping or managing your Housekeeping? N    Patient Care Team: [name], MD as PCP - General (Family Medicine) Inocencio Soyla Lunger, MD as PCP - Electrophysiology (Cardiology) Lavona Agent, MD as PCP - [facility] (Cardiology) Lavona Agent, MD as PCP - Cardiology (Cardiology) Inocencio Soyla Lunger, MD as Consulting Physician (Cardiology) Teressa Toribio SQUIBB, MD (Inactive) as Attending Physician (Gastroenterology) Sheldon Standing, MD as Consulting Physician (General Surgery) Tobie Nanci Baptise, MD as Referring Physician (Cardiology) Bensimhon, Toribio SAUNDERS,  MD as Consulting Physician (Cardiology) Lionell Jon DEL, [facility] (Pharmacist)  I have updated your Care Teams any recent Medical Services you may have received from other providers in the past year.     Assessment:   This is a routine wellness examination for Adrien.  Hearing/Vision screen Hearing Screening - Comments:: Denies hearing difficulties   Vision Screening - Comments:: Wears rx glasses - up to date with routine eye exams with  [facility]   Goals Addressed               This Visit's Progress     Increase physical activity (pt-stated)        Remain active       Depression Screen     05/19/2024    8:57 AM 12/25/2023    1:46 PM 12/20/2022   10:18 AM 10/16/2022   10:29 AM 06/06/2022   11:56 AM 11/30/2021   11:27 AM 11/20/2021    9:29 AM  PHQ 2/9 Scores  PHQ - 2 Score 0 0 0 0 0 1 0  PHQ- 9 Score    2  6     Fall Risk     05/19/2024    9:23 AM 12/25/2023    1:45 PM 12/20/2022   10:19 AM 11/20/2021    9:29 AM 11/14/2020    9:23 AM  Fall Risk   Falls in the past year? 0 0 0 0 0  Number falls in past yr: 0 0 0  0  Injury with Fall? 0 0 0  0  Risk for fall due to : No Fall Risks No Fall Risks No Fall Risks Medication side effect Impaired vision;Impaired balance/gait;Impaired mobility  Risk for fall due to: Comment     related to kness and vertigo  Follow up Falls evaluation completed Falls evaluation completed Falls prevention discussed Falls evaluation completed;Education provided;Falls prevention discussed       Data saved with a previous flowsheet row definition    MEDICARE RISK AT HOME:  Medicare Risk at Home Any stairs in or around the home?: Yes If so, are there any without handrails?: No Home free of loose throw rugs in walkways, pet beds, electrical cords, etc?: Yes Adequate lighting in your home to reduce risk of falls?: Yes Life alert?: No Use of a cane, walker or w/c?: No Grab bars in the bathroom?: No Shower chair or bench in shower?: No Elevated  toilet seat or a handicapped toilet?: No  TIMED UP AND GO:  Was the test performed?  Yes  Length of time to ambulate 10 feet: 10 sec Gait steady and fast without use of assistive device  Cognitive Function: 6CIT completed        05/19/2024    9:23 AM 12/20/2022   10:21 AM 11/20/2021    9:31 AM 11/14/2020    9:39 AM  6CIT Screen  What Year? 0 points 0 points 0 points 0 points  What month? 0 points 0 points 0 points 0 points  What time? 0 points 0 points 0 points   Count back from 20 0 points 0 points 0 points 0 points  Months in reverse 0 points 0 points 4 points 4 points  Repeat phrase 0 points 0 points 0 points 0 points  Total Score 0 points 0 points 4 points     Immunizations Immunization History  Administered Date(s) Administered   Influenza,inj,Quad PF,6+ Mos 11/25/2018, 08/16/2021   Influenza-Unspecified 08/23/2019, 09/18/2022   PFIZER Comirnaty(Gray Top)Covid-19 Tri-Sucrose Vaccine 05/12/2020, 06/03/2020   PFIZER(Purple Top)SARS-COV-2 Vaccination 05/12/2020, 06/03/2020   Pneumococcal Conjugate-13 11/25/2018    Screening Tests Health Maintenance  Topic Date Due   OPHTHALMOLOGY EXAM  Never done   Zoster Vaccines- Shingrix (1 of 2) Never done   Diabetic kidney evaluation - Urine ACR  02/10/2021   COVID-19 Vaccine (5 - 2024-25 season) 07/06/2023   DTaP/Tdap/Td (1 - Tdap) 12/09/2024 (Originally 01/03/1980)   Pneumococcal Vaccine 27-29 Years old (2 of 2 - PPSV23, PCV20, or PCV21) 12/09/2024 (Originally 01/20/2019)   INFLUENZA VACCINE  06/04/2024   HEMOGLOBIN A1C  06/08/2024   FOOT EXAM  12/09/2024   Diabetic kidney evaluation - eGFR measurement  01/06/2025   Medicare Annual Wellness (AWV)  05/19/2025   Colonoscopy  08/26/2033   Hepatitis C Screening  Completed   HIV Screening  Completed   Hepatitis B Vaccines  Aged Out   HPV VACCINES  Aged Out   Meningococcal B Vaccine  Aged Out    Health Maintenance  Health Maintenance Due  Topic Date Due   OPHTHALMOLOGY EXAM   Never done   Zoster Vaccines- Shingrix (1 of 2) Never done   Diabetic kidney evaluation - Urine ACR  02/10/2021   COVID-19 Vaccine (5 - 2024-25 season) 07/06/2023   Health Maintenance Items Addressed: Eye Exam deferred   Additional Screening:  Vision Screening: Recommended annual ophthalmology exams for early detection of glaucoma and other disorders of the eye. Would you  like a referral to an eye doctor? No    Dental Screening: Recommended annual dental exams for proper oral hygiene  Community Resource Referral / Chronic Care Management: CRR required this visit?  No   CCM required this visit?  No   Plan:    I have personally reviewed and noted the following in the patient's chart:   Medical and social history Use of alcohol, tobacco or illicit drugs  Current medications and supplements including opioid prescriptions. Patient is not currently taking opioid prescriptions. Functional ability and status Nutritional status Physical activity Advanced directives List of other physicians Hospitalizations, surgeries, and ER visits in previous 12 months Vitals Screenings to include cognitive, depression, and falls Referrals and appointments  In addition, I have reviewed and discussed with patient certain preventive protocols, quality metrics, and best practice recommendations. A written personalized care plan for preventive services as well as general preventive health recommendations were provided to patient.   Rojelio LELON Blush, LPN   2/83/7974   After Visit Summary: (In Person-Printed) AVS printed and given to the patient  Notes: Nothing significant to report at this time.

## 2024-05-19 NOTE — Patient Instructions (Addendum)
 [name] , Thank you for taking time out of your busy schedule to complete your Annual Wellness Visit with me. I enjoyed our conversation and look forward to speaking with you again next year. I, as well as your care team,  appreciate your ongoing commitment to your health goals. Please review the following plan we discussed and let me know if I can assist you in the future. Your Game plan/ To Do List    Referrals: If you haven't heard from the office you've been referred to, please reach out to them at the phon provided.   Follow up Visits: Next Medicare AWV with our clinical staff: 05/20/25 @ 9:20a   Have you seen your provider in the last 6 months (3 months if uncontrolled diabetes)?  Next Office Visit with your provider: 06/08/24 @ 10a  Clinician Recommendations:  Aim for 30 minutes of exercise or brisk walking, 6-8 glasses of water, and 5 servings of fruits and vegetables each day.       This is a list of the screening recommended for you and due dates:  Health Maintenance  Topic Date Due   Eye exam for diabetics  Never done   Zoster (Shingles) Vaccine (1 of 2) Never done   Yearly kidney health urinalysis for diabetes  02/10/2021   COVID-19 Vaccine (5 - 2024-25 season) 07/06/2023   DTaP/Tdap/Td vaccine (1 - Tdap) 12/09/2024*   Pneumococcal Vaccination (2 of 2 - PPSV23, PCV20, or PCV21) 12/09/2024*   Flu Shot  06/04/2024   Hemoglobin A1C  06/08/2024   Complete foot exam   12/09/2024   Yearly kidney function blood test for diabetes  01/06/2025   Medicare Annual Wellness Visit  05/19/2025   Colon Cancer Screening  08/26/2033   Hepatitis C Screening  Completed   HIV Screening  Completed   Hepatitis B Vaccine  Aged Out   HPV Vaccine  Aged Out   Meningitis B Vaccine  Aged Out  *Topic was postponed. The date shown is not the original due date.    Advanced directives: (Copy Requested) Please bring a copy of your health care power of attorney and living will to the office to be  added to your chart at your convenience. You can mail to [facility] [address] [place], [place] [address] or email to [email or web address] Advance Care Planning is important because it:  [x]  Makes sure you receive the medical care that is consistent with your values, goals, and preferences  [x]  It provides guidance to your family and loved ones and reduces their decisional burden about whether or not they are making the right decisions based on your wishes.  Follow the link provided in your after visit summary or read over the paperwork we have mailed to you to help you started getting your Advance Directives in place. If you need assistance in completing these, please reach out to us  so that we can help you!  See attachments for Preventive Care and Fall Prevention Tips.

## 2024-06-08 ENCOUNTER — Ambulatory Visit: Payer: 59 | Admitting: Family Medicine

## 2024-06-09 ENCOUNTER — Ambulatory Visit: Payer: Self-pay | Admitting: Family Medicine

## 2024-06-09 ENCOUNTER — Encounter: Payer: Self-pay | Admitting: Family Medicine

## 2024-06-09 ENCOUNTER — Ambulatory Visit (INDEPENDENT_AMBULATORY_CARE_PROVIDER_SITE_OTHER): Admitting: Family Medicine

## 2024-06-09 VITALS — BP 146/90 | HR 86 | Temp 98.0°F | Wt 208.1 lb

## 2024-06-09 DIAGNOSIS — E782 Mixed hyperlipidemia: Secondary | ICD-10-CM | POA: Diagnosis not present

## 2024-06-09 DIAGNOSIS — J449 Chronic obstructive pulmonary disease, unspecified: Secondary | ICD-10-CM

## 2024-06-09 DIAGNOSIS — I10 Essential (primary) hypertension: Secondary | ICD-10-CM

## 2024-06-09 DIAGNOSIS — K219 Gastro-esophageal reflux disease without esophagitis: Secondary | ICD-10-CM

## 2024-06-09 DIAGNOSIS — E118 Type 2 diabetes mellitus with unspecified complications: Secondary | ICD-10-CM | POA: Diagnosis not present

## 2024-06-09 LAB — MICROALBUMIN / CREATININE URINE RATIO
Creatinine,U: 168.7 mg/dL
Microalb Creat Ratio: 26.8 mg/g (ref 0.0–30.0)
Microalb, Ur: 4.5 mg/dL — ABNORMAL HIGH (ref 0.0–1.9)

## 2024-06-09 LAB — HEMOGLOBIN A1C: Hgb A1c MFr Bld: 7.1 % — ABNORMAL HIGH (ref 4.6–6.5)

## 2024-06-09 MED ORDER — PANTOPRAZOLE SODIUM 40 MG PO TBEC: 40.0000 mg | DELAYED_RELEASE_TABLET | Freq: Every day | ORAL | 2 refills | Status: DC

## 2024-06-09 MED ORDER — PRAVASTATIN SODIUM 40 MG PO TABS: 40.0000 mg | ORAL_TABLET | Freq: Every day | ORAL | 1 refills | Status: DC

## 2024-06-09 MED ORDER — AMLODIPINE BESYLATE 5 MG PO TABS: 5.0000 mg | ORAL_TABLET | Freq: Every day | ORAL | 1 refills | Status: AC

## 2024-06-09 MED ORDER — BREZTRI AEROSPHERE 160-9-4.8 MCG/ACT IN AERO: 2.0000 | INHALATION_SPRAY | Freq: Two times a day (BID) | RESPIRATORY_TRACT | 5 refills | Status: AC

## 2024-06-09 NOTE — Assessment & Plan Note (Signed)
 New diagnosis, responded well to PPI, will refill pantoprazole  for him today

## 2024-06-09 NOTE — Assessment & Plan Note (Signed)
 Reviewed his lipid panel done at [facility] in April 2025 in the care everywhere section, well controlled, will refill his pravastatin  today

## 2024-06-09 NOTE — Progress Notes (Signed)
 Established Patient Office Visit  Subjective   Patient ID: Bruce Robertson, male    DOB: [birth year]  Age: 63 y.o. MRN: [id number]  No chief complaint on file.   Pt is here for 6 month follow up on HTN, COPD and he is repotring recurrent acid reflux today.   GERD-- he was seen in urgent care for gastritis in march, notes and labs reviewed. He was given pantoprazole  and had good improvement in his symptoms. States that it has returned recently. He reports he does eat fast food, regular sodas, coffee and puts hot sauce on his food. I counseled the patient on dietary changes and handouts were given.   HTN -- BP in office performed and is elevated today. Pt states that he just took his medication right before he got here and it maybe hasn't kicked in yet. He  reports no side effects to the medications, no chest pain, SOB, dizziness or headaches. He has a BP cuff at home and is checking BP regularly, reports they are in the normal range.   COPD-- pt states he no longer smokes, states he is cutting down on smoking marijuana, is down to 2-3 times per week. Reports that the Breztri  continues to work well for him, needs refills.   Type 2 DM-- sees endo at [facility], last A1C was 6.8, reviewed last BMP with kidney function, he reports he is wearing his CGM and only uses the insulin  as needed but hasn't seemed to need to give himself insulin  recently. Needs urine microalbumin testing today, states he should have an appt coming up with his endocrinologist. I extensively reviewed his medications today with him and updated his list.        Current Outpatient Medications  Medication Instructions   ACCU-CHEK GUIDE test strip Use as directed-patient needs an appt   Accu-Chek Softclix Lancets lancets Use as directed   amLODipine  (NORVASC ) 5 mg, Oral, Daily   blood glucose meter kit and supplies KIT Dispense based on patient and insurance preference. Use up to four times daily as directed.   BREZTRI  AEROSPHERE  160-9-4.8 MCG/ACT AERO inhaler 2 puffs, Inhalation, 2 times daily   Continuous Glucose Sensor (FREESTYLE LIBRE 3 PLUS SENSOR) MISC 1 Application, Transdermal, Every 14 days   cyclobenzaprine  (FLEXERIL ) 10 mg, Oral, 2 times daily PRN   gabapentin  (NEURONTIN ) 100 mg, 3 times daily   ibuprofen  (ADVIL ) 600 mg, Oral, Every 6 hours PRN   insulin  lispro (HUMALOG) 2 Units, As needed   loperamide  (IMODIUM ) 2 mg, Oral, 4 times daily PRN   mycophenolate (MYFORTIC) 720 mg, 2 times daily   ondansetron  (ZOFRAN -ODT) 4 mg, Oral, Every 8 hours PRN   pantoprazole  (PROTONIX ) 40 mg, Oral, Daily   pravastatin  (PRAVACHOL ) 40 mg, Oral, Daily at bedtime   predniSONE  (DELTASONE ) 5 mg, Daily with breakfast   RELION PEN NEEDLES 31G X 6 MM MISC    tacrolimus  (PROGRAF ) 1 MG capsule Take by mouth. 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   Dizziness 12/31/2020   Patellar tendon rupture, right, initial encounter 09/13/2020   Restrictive lung disease 06/22/2020   BRBPR (bright red blood per rectum) 05/24/2020   Chronic systolic CHF (congestive heart failure) (HCC) 05/24/2020   Paroxysmal atrial fibrillation (HCC) 03/28/2020   Secondary hypercoagulable state (HCC) 03/28/2020   Educated about COVID-19 virus infection 02/10/2020   S/P ICD (internal cardiac defibrillator) procedure 07/18/2019   Nontraumatic complete tear of right rotator cuff 06/29/2019    Pain in left elbow 06/29/2019   Thrombocytopenia (HCC) 06/10/2018   AICD (automatic cardioverter/defibrillator) present 06/10/2018   History of peptic ulcer disease 06/10/2018   NSVT (nonsustained ventricular tachycardia) (HCC)    Hypotension due to drugs 02/09/2018   Difficulty controlling anger 12/31/2016   Anxiety 08/20/2016   Moderate episode of recurrent major depressive disorder (HCC) 08/20/2016   Polyp of colon 04/29/2016   Shortness of breath 03/05/2016   Essential hypertension 10/31/2015   Acute on chronic systolic heart failure (HCC) 08/29/2015   Hx of CABG 08/29/2015   Stage 3 chronic kidney disease (HCC) 08/29/2015   OSA treated with BiPAP 06/10/2011   Fatigue 06/10/2011   Obesity (BMI 30-39.9) 05/02/2010   Hemorrhoids 02/08/2010   GERD (gastroesophageal reflux disease) 02/08/2010   Rectal bleeding 02/08/2010   Hyperlipidemia 04/06/2009   Cardiomyopathy, dilated, nonischemic (HCC) 04/06/2009   Non-ischemic cardiomyopathy (HCC) 04/06/2009      Review of Systems  All other systems reviewed and are negative.     Objective:     BP (!) 146/90   Pulse 86   Temp 98 F (36.7 C) (Oral)   Wt 208 lb 1.6 oz (94.4 kg)   SpO2 99%   BMI 28.22 kg/m    Physical Exam Vitals reviewed.  Constitutional:      Appearance: Normal appearance. He is well-groomed and normal weight.  Eyes:     Extraocular Movements: Extraocular movements intact.     Conjunctiva/sclera: Conjunctivae normal.  Neck:     Thyroid : No thyromegaly.  Cardiovascular:     Rate and Rhythm: Normal rate and regular rhythm.     Heart sounds: S1 normal and S2 normal. No murmur heard. Pulmonary:     Effort: Pulmonary effort is normal.     Breath sounds: Normal breath sounds and air entry. No rales.  Abdominal:     General: Abdomen is flat. Bowel sounds are normal.  Musculoskeletal:     Right lower leg: No edema.     Left lower leg: No edema.  Neurological:     General: No focal deficit present.      Mental Status: He is alert and oriented to person, place, and time.     Gait: Gait is intact.  Psychiatric:        Mood and Affect: Mood and affect normal.      No results found for any visits on 06/09/24.    The 10-year ASCVD risk score (Arnett DK, et al., 2019) is: 27.3%    Assessment & Plan:  Essential hypertension Assessment & Plan: Current hypertension medications:       Sig   amLODipine  (NORVASC ) 5 MG tablet Take 1 tablet (5 mg total) by mouth daily.      Chronic, elevated today however it is usually under good control. Will continue the above medication as prescribed, will recheck his BP at his annual physical  Orders: -     amLODIPine  Besylate; Take 1 tablet (5 mg total) by mouth daily.  Dispense: 90 tablet; Refill: 1  Chronic obstructive pulmonary disease, unspecified COPD type (HCC) Assessment & Plan: Chronic, stable, lungs clear on exam today. Pt is requesting refills of the breztri , states this is working very well for him, no side effects reported. States that he doesn't really have to use his rescue inhaler since he started on this medication. Will refill for him today.   Orders: -     Breztri  Aerosphere; Inhale 2 puffs into the lungs 2 (two) times daily.  Dispense: 10.7 g; Refill: 5  Mixed hyperlipidemia Assessment & Plan: Reviewed his lipid panel done at [facility] in April 2025 in the care everywhere section, well controlled, will refill his pravastatin  today  Orders: -     Pravastatin  Sodium; Take 1 tablet (40 mg total) by mouth at bedtime.  Dispense: 90 tablet; Refill: 1  Gastroesophageal reflux disease without esophagitis Assessment & Plan: New diagnosis, responded well to PPI, will refill pantoprazole  for him today  Orders: -     Pantoprazole  Sodium; Take 1 tablet (40 mg total) by mouth daily.  Dispense: 30 tablet; Refill: 2  Type 2 diabetes mellitus with complication, without long-term current use of insulin  [facility]) Assessment & Plan: Sees an  endocrinologist, however he has not had urine microalbumin testing, will also check new A1C today also. Continue dietary control plus PRN short acting insulin ,.we reviewed his diet and I counseled him on reducing sugar and starches in his diet.   Orders: -     Hemoglobin A1c; Future -     Microalbumin / creatinine urine ratio; Future     Return in about 6 months (around 12/10/2024) for HTN.    Heron CHRISTELLA Sharper, MD

## 2024-06-09 NOTE — Assessment & Plan Note (Signed)
 Chronic, stable, lungs clear on exam today. Pt is requesting refills of the breztri , states this is working very well for him, no side effects reported. States that he doesn't really have to use his rescue inhaler since he started on this medication. Will refill for him today.

## 2024-06-09 NOTE — Assessment & Plan Note (Signed)
 Current hypertension medications:       Sig   amLODipine  (NORVASC ) 5 MG tablet Take 1 tablet (5 mg total) by mouth daily.      Chronic, elevated today however it is usually under good control. Will continue the above medication as prescribed, will recheck his BP at his annual physical

## 2024-06-09 NOTE — Assessment & Plan Note (Addendum)
 Sees an endocrinologist, however he has not had urine microalbumin testing, will also check new A1C today also. Continue dietary control plus PRN short acting insulin ,.we reviewed his diet and I counseled him on reducing sugar and starches in his diet.

## 2024-08-18 DIAGNOSIS — Z941 Heart transplant status: Secondary | ICD-10-CM | POA: Diagnosis not present

## 2024-08-30 ENCOUNTER — Other Ambulatory Visit: Payer: Self-pay | Admitting: Family Medicine

## 2024-08-30 ENCOUNTER — Emergency Department (HOSPITAL_BASED_OUTPATIENT_CLINIC_OR_DEPARTMENT_OTHER)

## 2024-08-30 ENCOUNTER — Encounter (HOSPITAL_BASED_OUTPATIENT_CLINIC_OR_DEPARTMENT_OTHER): Payer: Self-pay

## 2024-08-30 ENCOUNTER — Emergency Department (HOSPITAL_BASED_OUTPATIENT_CLINIC_OR_DEPARTMENT_OTHER)
Admission: EM | Admit: 2024-08-30 | Discharge: 2024-08-30 | Disposition: A | Discharge status: Home / Self Care | Attending: Emergency Medicine | Admitting: Emergency Medicine

## 2024-08-30 ENCOUNTER — Other Ambulatory Visit: Payer: Self-pay

## 2024-08-30 DIAGNOSIS — M791 Myalgia, unspecified site: Secondary | ICD-10-CM | POA: Diagnosis not present

## 2024-08-30 DIAGNOSIS — R112 Nausea with vomiting, unspecified: Secondary | ICD-10-CM | POA: Insufficient documentation

## 2024-08-30 DIAGNOSIS — I10 Essential (primary) hypertension: Secondary | ICD-10-CM | POA: Diagnosis not present

## 2024-08-30 DIAGNOSIS — Z794 Long term (current) use of insulin: Secondary | ICD-10-CM | POA: Insufficient documentation

## 2024-08-30 DIAGNOSIS — R1013 Epigastric pain: Secondary | ICD-10-CM | POA: Insufficient documentation

## 2024-08-30 DIAGNOSIS — K219 Gastro-esophageal reflux disease without esophagitis: Secondary | ICD-10-CM

## 2024-08-30 DIAGNOSIS — Z7984 Long term (current) use of oral hypoglycemic drugs: Secondary | ICD-10-CM | POA: Insufficient documentation

## 2024-08-30 LAB — URINALYSIS, ROUTINE W REFLEX MICROSCOPIC
Bacteria, UA: NONE SEEN
Bilirubin Urine: NEGATIVE
Glucose, UA: NEGATIVE mg/dL
Hgb urine dipstick: NEGATIVE
Ketones, ur: 15 mg/dL — AB
Leukocytes,Ua: NEGATIVE
Nitrite: NEGATIVE
Protein, ur: 30 mg/dL — AB
Specific Gravity, Urine: 1.03 (ref 1.005–1.030)
pH: 5.5 (ref 5.0–8.0)

## 2024-08-30 LAB — CBC
HCT: 48.5 % (ref 39.0–52.0)
Hemoglobin: 15.7 g/dL (ref 13.0–17.0)
MCH: 27.3 pg (ref 26.0–34.0)
MCHC: 32.4 g/dL (ref 30.0–36.0)
MCV: 84.3 fL (ref 80.0–100.0)
Platelets: 180 K/uL (ref 150–400)
RBC: 5.75 MIL/uL (ref 4.22–5.81)
RDW: 13.6 % (ref 11.5–15.5)
WBC: 5 K/uL (ref 4.0–10.5)
nRBC: 0 % (ref 0.0–0.2)

## 2024-08-30 LAB — COMPREHENSIVE METABOLIC PANEL WITH GFR
ALT: 26 U/L (ref 0–44)
AST: 21 U/L (ref 15–41)
Albumin: 4.7 g/dL (ref 3.5–5.0)
Alkaline Phosphatase: 68 U/L (ref 38–126)
Anion gap: 13 (ref 5–15)
BUN: 23 mg/dL (ref 8–23)
CO2: 25 mmol/L (ref 22–32)
Calcium: 11 mg/dL — ABNORMAL HIGH (ref 8.9–10.3)
Chloride: 99 mmol/L (ref 98–111)
Creatinine, Ser: 1.3 mg/dL — ABNORMAL HIGH (ref 0.61–1.24)
GFR, Estimated: >60 mL/min (ref >60)
Glucose, Bld: 134 mg/dL — ABNORMAL HIGH (ref 70–99)
Potassium: 4.2 mmol/L (ref 3.5–5.1)
Sodium: 137 mmol/L (ref 135–145)
Total Bilirubin: 1 mg/dL (ref 0.0–1.2)
Total Protein: 8.2 g/dL — ABNORMAL HIGH (ref 6.5–8.1)

## 2024-08-30 LAB — RESP PANEL BY RT-PCR (RSV, FLU A&B, COVID)  RVPGX2
Influenza A by PCR: NEGATIVE
Influenza B by PCR: NEGATIVE
Resp Syncytial Virus by PCR: NEGATIVE
SARS Coronavirus 2 by RT PCR: NEGATIVE

## 2024-08-30 LAB — LIPASE, BLOOD: Lipase: 27 U/L (ref 11–51)

## 2024-08-30 MED ORDER — SUCRALFATE 1 GM/10ML PO SUSP: 1.0000 g | Freq: Three times a day (TID) | ORAL | 0 refills | Status: AC

## 2024-08-30 MED ORDER — FAMOTIDINE IN NACL 20-0.9 MG/50ML-% IV SOLN
20.0000 mg | Freq: Once | INTRAVENOUS | Status: AC
  Administered 2024-08-30: 20 mg via INTRAVENOUS
  Filled 2024-08-30: qty 50

## 2024-08-30 MED ORDER — ONDANSETRON 4 MG PO TBDP: 4.0000 mg | ORAL_TABLET | Freq: Three times a day (TID) | ORAL | 0 refills | Status: AC | PRN

## 2024-08-30 MED ORDER — ONDANSETRON HCL 4 MG/2ML IJ SOLN
4.0000 mg | Freq: Once | INTRAMUSCULAR | Status: AC
  Administered 2024-08-30: 4 mg via INTRAVENOUS
  Filled 2024-08-30: qty 2

## 2024-08-30 MED ORDER — LACTATED RINGERS IV BOLUS
1000.0000 mL | Freq: Once | INTRAVENOUS | Status: AC
  Administered 2024-08-30: 1000 mL via INTRAVENOUS

## 2024-08-30 NOTE — ED Triage Notes (Signed)
 Patient reports nausea and vomiting for 2-3 days. He also has all over body aches and pains. He reports he thinks he may also have been bitten by a spider or insect because his left hip is sore.

## 2024-08-30 NOTE — Discharge Instructions (Addendum)
 Follow-up with primary care

## 2024-08-30 NOTE — ED Provider Notes (Signed)
 [place] EMERGENCY DEPARTMENT AT [facility] Provider Note   CSN: [id number] Arrival date & time: 08/30/24  1207     Patient presents with: Emesis, Nausea, Generalized Body Aches, and Insect Bite   Bruce Robertson is a 63 y.o. male.    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  Also indicates that he has some pain in his left hip where he has noticed a lump.     Prior to Admission medications   Medication Sig Start Date End Date Taking? Authorizing Provider  ondansetron  (ZOFRAN -ODT) 4 MG disintegrating tablet Take 1 tablet (4 mg total) by mouth every 8 (eight) hours as needed for nausea or vomiting. 08/30/24  Yes Wynonna Fitzhenry S, PA  rosuvastatin (CRESTOR) 10 MG tablet Take 10 mg by mouth daily. 07/05/24  Yes [provider]  sucralfate  (CARAFATE ) 1 GM/10ML suspension Take 10 mLs (1 g total) by mouth in the morning, at noon, and at bedtime. 08/30/24  Yes Aracely Rickett, Hamp RAMAN, [place]  ACCU-CHEK GUIDE test strip Use as directed-patient needs an appt 08/12/22   Bruce Robertson HERO, MD  Accu-Chek Softclix Lancets lancets Use as directed 08/13/22   Bruce Robertson HERO, MD  amLODipine  (NORVASC ) 5 MG tablet Take 1 tablet (5 mg total) by mouth daily. 06/09/24   Bruce Robertson HERO, MD  blood glucose meter kit and supplies KIT Dispense based on patient and insurance preference. Use up to four times daily as directed. 02/14/20   Koberlein, Junell C, MD  BREZTRI  AEROSPHERE 160-9-4.8 MCG/ACT AERO inhaler Inhale 2 puffs into the lungs 2 (two) times daily. 06/09/24   Bruce Robertson HERO, MD  Continuous Glucose Sensor (FREESTYLE LIBRE 3  PLUS SENSOR) MISC Place 1 Application onto the skin every 14 (fourteen) days. 12/10/23   Bruce Robertson HERO, MD  cyclobenzaprine  (FLEXERIL ) 10 MG tablet Take 1 tablet (10 mg total) by mouth 2 (two) times daily as needed for muscle spasms. 02/21/24   Bruce Colon, PA-C  gabapentin  (NEURONTIN ) 100 MG capsule Take 100 mg by mouth 3 (three) times daily. 11/27/23   [provider]  ibuprofen  (ADVIL ) 600 MG tablet Take 1 tablet (600 mg total) by mouth every 6 (six) hours as needed. 02/21/24   Bruce Colon, PA-C  insulin  lispro (HUMALOG) 100 UNIT/ML injection Inject 2 Units into the skin as needed.    [provider]  insulin  lispro (HUMALOG) 100 UNIT/ML KwikPen Inject into the skin.    [provider]  loperamide  (IMODIUM ) 2 MG capsule Take 1 capsule (2 mg total) by mouth 4 (four) times daily as needed for diarrhea or loose stools. 01/07/24   Barrett, Warren SAILOR, PA-C  mycophenolate (MYFORTIC) 360 MG TBEC EC tablet Take 720 mg by mouth 2 (two) times daily. 10/12/22   [provider]  pantoprazole  (PROTONIX ) 40 MG tablet Take 1 tablet by mouth once daily 08/30/24   Bruce Robertson HERO, MD  pravastatin  (PRAVACHOL ) 40 MG tablet Take 1 tablet (40 mg total) by mouth at bedtime. 06/09/24   Bruce Robertson HERO, MD  predniSONE  (DELTASONE ) 5 MG tablet Take 5 mg by mouth daily with breakfast.    [provider]  RELION PEN NEEDLES 31G X 6 MM  MISC  07/09/23   [provider]  tacrolimus  (PROGRAF ) 1 MG capsule Take by mouth. 9 tablets twice a day 12/05/23   [provider]    Allergies: Patient has no known allergies.    Review of Systems  Gastrointestinal:  Positive for vomiting.    Updated Vital Signs BP (!) 125/94   Pulse 95   Temp 98.1 F (36.7 C) (Oral)   Resp 18   SpO2 100%   Physical Exam Vitals and nursing note reviewed.  Constitutional:      General: He is not in acute distress. HENT:     Head: Normocephalic and atraumatic.     Nose: Nose normal.   Eyes:     General: No scleral icterus. Cardiovascular:     Rate and Rhythm: Normal rate and regular rhythm.     Pulses: Normal pulses.     Heart sounds: Normal heart sounds.  Pulmonary:     Effort: Pulmonary effort is normal. No respiratory distress.     Breath sounds: No wheezing.  Abdominal:     Palpations: Abdomen is soft.     Tenderness: There is abdominal tenderness.     Comments: Epigastric tenderness present with no guarding or rebound  Musculoskeletal:     Cervical back: Normal range of motion.     Right lower leg: No edema.     Left lower leg: No edema.  Skin:    General: Skin is warm and dry.     Capillary Refill: Capillary refill takes less than 2 seconds.  Neurological:     Mental Status: He is alert. Mental status is at baseline.  Psychiatric:        Mood and Affect: Mood normal.        Behavior: Behavior normal.     (all labs ordered are listed, but only abnormal results are displayed) Labs Reviewed  COMPREHENSIVE METABOLIC PANEL WITH GFR - Abnormal; Notable for the following components:      Result Value   Glucose, Bld 134 (*)    Creatinine, Ser 1.30 (*)    Calcium  11.0 (*)    Total Protein 8.2 (*)    All other components within normal limits  URINALYSIS, ROUTINE W REFLEX MICROSCOPIC - Abnormal; Notable for the following components:   Ketones, ur 15 (*)    Protein, ur 30 (*)    All other components within normal limits  RESP PANEL BY RT-PCR (RSV, FLU A&B, COVID)  RVPGX2  LIPASE, BLOOD  CBC    EKG: None  Radiology: US  Abdomen Limited RUQ (LIVER/GB) Result Date: 08/30/2024 CLINICAL DATA:  RIGHT upper quadrant pain with nausea and vomiting for 2-3 days. EXAM: ULTRASOUND ABDOMEN LIMITED RIGHT UPPER QUADRANT COMPARISON:  CT abdomen pelvis with contrast 01/07/2024 FINDINGS: Gallbladder: Gallstones: None Sludge: None Gallbladder Wall: Within normal limits Pericholecystic fluid: None Sonographic Murphy's Sign: Negative per technologist Common bile duct:  Diameter: 3 mm Liver: Parenchymal echogenicity: Within normal limits Contours: Normal Lesions: None Portal vein: Patent.  Hepatopetal flow Other: None. IMPRESSION: No significant sonographic abnormality of the liver or gallbladder. Electronically Signed   By: Aliene Lloyd M.D.   On: 08/30/2024 15:04     Procedures   Medications Ordered in the ED  lactated ringers  bolus 1,000 mL (0 mLs Intravenous Stopped 08/30/24 1531)  ondansetron  (ZOFRAN ) injection 4 mg (4 mg Intravenous Given 08/30/24 1319)  famotidine  (PEPCID ) IVPB 20 mg premix (0 mg Intravenous Stopped 08/30/24 1531)  Medical Decision Making Amount and/or Complexity of Data Reviewed Labs: ordered. Radiology: ordered.  Risk Prescription drug management.   This patient presents to the ED for concern of abd pain, this involves a number of treatment options, and is a complaint that carries with it a high risk of complications and morbidity. A differential diagnosis was considered for the patient's symptoms which is discussed below:   The causes of generalized abdominal pain include but are not limited to AAA, mesenteric ischemia, appendicitis, diverticulitis, DKA, gastritis, gastroenteritis, AMI, nephrolithiasis, pancreatitis, peritonitis, adrenal insufficiency,lead poisoning, iron toxicity, intestinal ischemia, constipation, UTI,SBO/LBO, splenic rupture, biliary disease, IBD, IBS, PUD, or hepatitis.   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  Also indicates that he has some pain in his left hip  where he has noticed a lump.    EMR reviewed including pt PMHx, past surgical history and past visits to ER.   See HPI for more details   Lab Tests:   I personally reviewed all laboratory work and imaging. Metabolic panel without any acute abnormality specifically kidney function within normal limits and no significant electrolyte abnormalities. CBC without leukocytosis or significant anemia.   Imaging Studies:  Abnormal findings. I personally reviewed all imaging studies. Imaging notable for  IMPRESSION:  No significant sonographic abnormality of the liver or gallbladder.    Cardiac Monitoring:  The patient was maintained on a cardiac monitor.  I personally viewed and interpreted the cardiac monitored which showed an underlying rhythm of:NSR EKG non-ischemic   Medicines ordered:  I ordered medication including Pepcid , LR, Zofran  for hydration, nausea vomiting, epigastric abdominal pain Reevaluation of the patient after these medicines showed that the patient improved I have reviewed the patients home medicines and have made adjustments as needed   Critical Interventions:     Consults/Attending Physician      Reevaluation:  After the interventions noted above I re-evaluated patient and found that they have :improved   Social Determinants of Health:      Problem List / ED Course:  Patient with epigastric abdominal pain states that he did have some black tarry stool recently but that resolved.  Primarily am concerned for gastritis versus peptic ulcer disease he is on Protonix  already recommend he continue this added on Carafate  bided patient with ambulatory referral to gastroenterology he has been seen by [facility] in the past therefore referral was for GI at [facility].  Zofran  for nausea.  Return precautions to the emergency room provided.  Patient feels much improved at this time.   Dispostion:  After consideration of the diagnostic results and the patients  response to treatment, I feel that the patent would benefit from close outpatient follow-up    Final diagnoses:  Epigastric abdominal pain    ED Discharge Orders          Ordered    sucralfate  (CARAFATE ) 1 GM/10ML suspension  3 times daily        08/30/24 1526    Ambulatory referral to Gastroenterology        08/30/24 1526    ondansetron  (ZOFRAN -ODT) 4 MG disintegrating tablet  Every 8 hours PRN  08/30/24 1531               Neldon Hamp RAMAN, PA 08/30/24 1934    Bari Roxie HERO, DO 09/01/24 1513

## 2024-09-01 ENCOUNTER — Ambulatory Visit: Payer: Self-pay

## 2024-09-01 NOTE — Telephone Encounter (Signed)
 Noted ok to close, pt should go to ED

## 2024-09-01 NOTE — Telephone Encounter (Signed)
 Patient informed of the instructions as below and stated he would like to keep the appt on 10/30 to be evaluated for the rash.

## 2024-09-01 NOTE — Telephone Encounter (Signed)
 FYI Only or Action Required?: FYI only for provider: appointment scheduled on 09/02/2024.  Patient was last seen in primary care on 06/09/2024 by Bruce Heron HERO, MD.  Called Nurse Triage reporting Rash and vomiting.  Symptoms began a week ago (rash). One day ago, vomiting  Interventions attempted: Other: rubbing alcohol for rash, ED visit for vomiting  Symptoms are: gradually worsening.  Triage Disposition: Go to ED Now (or PCP Triage)- for vomiting  Patient/caregiver understands and will follow disposition?: Yes- patient will pick up zofran  prescribed at ED, if vomiting persists, he will return to ED  Copied from CRM #[id number]. Topic: Clinical - Red Word Triage >> Sep 01, 2024 11:47 AM Ashley R wrote: Red Word that prompted transfer to Nurse Triage: Breaking out on left leg, thought it was shingles, but could be a rash. Nausea, dizzyness, stomach pain   ----------------------------------------------------------------------- From previous Reason for Contact - Scheduling: Patient/patient representative is calling to schedule an appointment. Refer to attachments for appointment information. Reason for Disposition  [1] Drinking very little AND [2] dehydration suspected (e.g., no urine > 12 hours, very dry mouth, very lightheaded)  Answer Assessment - Initial Assessment Questions 1. VOMITING SEVERITY: How many times have you vomited in the past 24 hours?      8 x in past 24 hours 2. ONSET: When did the vomiting begin?      Two days ago 3. FLUIDS: What fluids or food have you vomited up today? Have you been able to keep any fluids down?     Patient was able to keep water down this morning 4. ABDOMEN PAIN: Are your having any abdomen pain? If Yes : How bad is it and what does it feel like? (e.g., crampy, dull, intermittent, constant)      Mild irritation 5. DIARRHEA: Is there any diarrhea? If Yes, ask: How many times today?      Positive for diarrhea, 3 times today.   Liquid stool 6. CONTACTS: Is there anyone else in the family with the same symptoms?      Wife sick with similar symptoms 7. CAUSE: What do you think is causing your vomiting?     unsure 8. HYDRATION STATUS: Any signs of dehydration? (e.g., dry mouth [not only dry lips], too weak to stand) When did you last urinate?     Able to urinate this morning, dark, and less than normal 9. OTHER SYMPTOMS: Do you have any other symptoms? (e.g., fever, headache, vertigo, vomiting blood or coffee grounds, recent head injury)     Mild head ache, mild dizziness  Answer Assessment - Initial Assessment Questions 1. APPEARANCE of RASH: What does the rash look like? (e.g., blisters, dry flaky skin, red spots, redness, sores)     Clusters of spots 2. LOCATION: Where is the rash located?      Left buttock and groin, beginning to extend down leg 3. NUMBER: How many spots are there?      Too many to count 4. SIZE: How big are the spots? (e.g., inches, cm; or compare to size of pinhead, tip of pen, eraser, pea)      Size of a small eraser 5. ONSET: When did the rash start?      One week ago 6. ITCHING: Does the rash itch? If Yes, ask: How bad is the itch?  (Scale 0-10; or none, mild, moderate, severe)     10/10 7. PAIN: Does the rash hurt? If Yes, ask: How bad is the pain?  (Scale 0-10;  or none, mild, moderate, severe)     7/10 8. OTHER SYMPTOMS: Do you have any other symptoms? (e.g., fever)     Leg pain x 3 days, thigh, radiating down to calf and heel  Protocols used: Rash or Redness - Localized-A-AH, Vomiting-A-AH

## 2024-09-02 ENCOUNTER — Encounter: Payer: Self-pay | Admitting: Family Medicine

## 2024-09-02 ENCOUNTER — Ambulatory Visit (INDEPENDENT_AMBULATORY_CARE_PROVIDER_SITE_OTHER): Admitting: Family Medicine

## 2024-09-02 VITALS — BP 98/60 | HR 100 | Temp 98.6°F | Ht 72.0 in | Wt 198.9 lb

## 2024-09-02 DIAGNOSIS — D352 Benign neoplasm of pituitary gland: Secondary | ICD-10-CM | POA: Insufficient documentation

## 2024-09-02 DIAGNOSIS — Z941 Heart transplant status: Secondary | ICD-10-CM | POA: Insufficient documentation

## 2024-09-02 DIAGNOSIS — B029 Zoster without complications: Secondary | ICD-10-CM

## 2024-09-02 DIAGNOSIS — I480 Paroxysmal atrial fibrillation: Secondary | ICD-10-CM | POA: Diagnosis not present

## 2024-09-02 DIAGNOSIS — F331 Major depressive disorder, recurrent, moderate: Secondary | ICD-10-CM

## 2024-09-02 DIAGNOSIS — R570 Cardiogenic shock: Secondary | ICD-10-CM | POA: Insufficient documentation

## 2024-09-02 MED ORDER — VALACYCLOVIR HCL 1 G PO TABS: 1000.0000 mg | ORAL_TABLET | Freq: Three times a day (TID) | ORAL | 0 refills | Status: AC

## 2024-09-02 MED ORDER — GABAPENTIN 300 MG PO CAPS: 300.0000 mg | ORAL_CAPSULE | Freq: Three times a day (TID) | ORAL | 0 refills | Status: DC

## 2024-09-02 NOTE — Patient Instructions (Signed)
 Lidocaine  1% cream apply several times a day to help with pain/ itching

## 2024-09-02 NOTE — Assessment & Plan Note (Signed)
 MRI in 2022 showed a benign cyst, was seeing Dr. Skeet in 2022, no further work up was recommended

## 2024-09-02 NOTE — Assessment & Plan Note (Signed)
 Essentially resolved after heart transplant 2 years ago, managed by [facility] cardiology.

## 2024-09-02 NOTE — Progress Notes (Signed)
 Established Patient Office Visit  Patient ID: Bruce Robertson, male    DOB: [birth year]  Age: 63 y.o. MRN: [id number] PCP: [name], MD  Chief Complaint  Patient presents with   Rash    Patient complains of a painful, blisters and itchy rash noted left buttock x1 week, now spreading to the left groin x2 days and left lateral foot x1 day, pain noted when walking and difficulty moving toes x2 days    Subjective:     Rash Pertinent negatives include no fever.    Discussed the use of AI scribe software for clinical note transcription with the patient, who gave verbal consent to proceed.  History of Present Illness   Bruce Robertson is a 63 year old male on immunosuppressives for a heart transplant who presents with a painful rash suggestive of shingles.  The rash began a week ago on his foot and has spread to his groin and down the side of his leg. It is sore and itchy, with pain radiating to the middle of his leg and wrapping around to his calf. Blisters on his foot cause discomfort akin to 'walking on rocks'. He is on immunosuppressive medication for a heart transplant, which may increase his risk for infections. He takes gabapentin  100 mg three times daily and uses Tylenol  for pain relief, requiring three to four tablets for effect. The rash is unilateral, and the vesicles have not broken open.       Patient Active Problem List   Diagnosis Date Noted   Heart transplant status (HCC) 09/02/2024   Pituitary adenoma (HCC) 09/02/2024   Chronic obstructive pulmonary disease (HCC) 12/10/2023   Other male erectile dysfunction 12/10/2023   Chronic insomnia 10/16/2022   Type 2 diabetes mellitus with complication, without long-term current use of insulin  (HCC) 04/04/2022   Pituitary abnormality 07/04/2021   Dizziness 12/31/2020   Patellar tendon rupture, right, initial encounter 09/13/2020   Restrictive lung disease 06/22/2020   BRBPR (bright red blood per rectum) 05/24/2020    Secondary hypercoagulable state 03/28/2020   Educated about COVID-19 virus infection 02/10/2020   S/P ICD (internal cardiac defibrillator) procedure 07/18/2019   Nontraumatic complete tear of right rotator cuff 06/29/2019   Pain in left elbow 06/29/2019   Thrombocytopenia 06/10/2018   AICD (automatic cardioverter/defibrillator) present 06/10/2018   History of peptic ulcer disease 06/10/2018   Difficulty controlling anger 12/31/2016   Anxiety 08/20/2016   Polyp of colon 04/29/2016   Shortness of breath 03/05/2016   Essential hypertension 10/31/2015   Hx of CABG 08/29/2015   Stage 3 chronic kidney disease (HCC) 08/29/2015   OSA treated with BiPAP 06/10/2011   Obesity (BMI 30-39.9) 05/02/2010   Hemorrhoids 02/08/2010   GERD (gastroesophageal reflux disease) 02/08/2010   Rectal bleeding 02/08/2010   Hyperlipidemia 04/06/2009      Review of Systems  Constitutional:  Negative for chills, diaphoresis, fever and malaise/fatigue.  Skin:  Positive for rash.  All other systems reviewed and are negative.     Objective:     BP 98/60   Pulse 100   Temp 98.6 F (37 C) (Oral)   Ht 6' (1.829 m)   Wt 198 lb 14.4 oz (90.2 kg)   SpO2 98%   BMI 26.98 kg/m    Physical Exam Vitals reviewed.  Constitutional:      Appearance: Normal appearance. He is normal weight.  Pulmonary:     Effort: Pulmonary effort is normal.  Neurological:     Mental Status: He is  alert and oriented to person, place, and time. Mental status is at baseline.      No results found for any visits on 09/02/24.    The 10-year ASCVD risk score (Arnett DK, et al., 2019) is: 13.8%    Assessment & Plan:   Problem List Items Addressed This Visit       Unprioritized   RESOLVED: Paroxysmal atrial fibrillation (HCC)   Essentially resolved after heart transplant 2 years ago, managed by [facility] cardiology.      RESOLVED: Moderate episode of recurrent major depressive disorder (HCC)   PHQ in July 2025 was 0, he  is no longer taking medication for this, no symptoms on exam, essentially resolved/ in remission       Heart transplant status (HCC)   2 years ago, managed by [facility] cardiology transplant team. Defer to their management.      Pituitary adenoma (HCC)   MRI in 2022 showed a benign cyst, was seeing Dr. Skeet in 2022, no further work up was recommended      Other Visit Diagnoses       Herpes zoster without complication    -  Primary   Relevant Medications   valACYclovir  (VALTREX ) 1000 MG tablet   gabapentin  (NEURONTIN ) 300 MG capsule       Assessment and Plan    Herpes zoster (shingles) of right lower extremity and groin with complications Acute herpes zoster infection with a classic clustered vesicular rash following a dermatome on the right lower extremity and groin. Reports severe pain and itching. Complicated by immunosuppression due to heart transplant, increasing the risk of a more severe infection. Has not had chickenpox but was likely exposed to the virus. Vesicles are intact, indicating a high risk of contagion once he ruptures. - Prescribe Valaciclovir 1000 mg, to be taken three times a day for 7-10 days. - Increase gabapentin  dosage to 300 mg capsules, three times a day for 10 days, to manage neuropathic pain. - Advise the use of over-the-counter lidocaine  1% cream for pain and itching relief, to be applied several times a day. - Recommend acetaminophen  for additional pain relief as needed. - Provide a work excuse for September 03, 2024, to allow rest and recovery. - Instruct on infection control measures, including wearing pants to bed and practicing good hand hygiene to prevent spreading the virus. - Advise on the contagious nature of shingles and the importance of avoiding contact with others until vesicles crust over.  Immunosuppression due to medication for heart transplant On immunosuppressive medication for a heart condition, which increases susceptibility to infections  such as shingles. This necessitates prompt and effective treatment to prevent complications. - Continue current immunosuppressive regimen as prescribed by the cardiology team.        No follow-ups on file.    Heron CHRISTELLA Sharper, MD [facility] [place]

## 2024-09-02 NOTE — Assessment & Plan Note (Signed)
 2 years ago, managed by [facility] cardiology transplant team. Defer to their management.

## 2024-09-02 NOTE — Assessment & Plan Note (Signed)
 PHQ in July 2025 was 0, he is no longer taking medication for this, no symptoms on exam, essentially resolved/ in remission

## 2024-09-17 ENCOUNTER — Ambulatory Visit: Payer: Self-pay

## 2024-09-17 NOTE — Telephone Encounter (Signed)
  FYI Only or Action Required?: Action required by provider: request for appointment.  Patient was last seen in primary care on 09/02/2024 by Bruce Heron HERO, MD.  Called Nurse Triage reporting Herpes Zoster.  Symptoms began several weeks ago.  Interventions attempted: Prescription medications: gabapentin , Valtrex .  Symptoms are: gradually worsening. Rash is getting better, but pain continues. Rx not helping.  Triage Disposition: See PCP Within 2 Weeks  Patient/caregiver understands and will follow disposition?: Yes    Copied from CRM [id number]. Topic: Clinical - Red Word Triage >> Sep 17, 2024 10:27 AM Bruce Robertson wrote: [facility] that prompted transfer to Nurse Triage:  Left foot swelling and rash from shingles.Medication is done but rash has not cleared up.Painful to even walk. Reason for Disposition  Pain persisting > 1 month after rash disappears  Answer Assessment - Initial Assessment Questions 1. APPEARANCE of RASH: What does the rash look like?      Getting better, still having pain 2. LOCATION: Where is the rash located?      foot 3. ONSET: When did the rash start?      weeks 4. ITCHING: Does the rash itch? If Yes, ask: How bad is the itch?  (Scale 1-10; or mild, moderate, severe)     no 5. PAIN: Does the rash hurt? If Yes, ask: How bad is the pain?  (Scale 0-10; or none, mild, moderate, severe)     8 6. OTHER SYMPTOMS: Do you have any other symptoms? (e.g., fever)     no 7. PREGNANCY: Is there any chance you are pregnant? When was your last menstrual period?     N/a  Protocols used: Shingles (Zoster)-A-AH

## 2024-09-17 NOTE — Telephone Encounter (Signed)
 Noted- ok to close.

## 2024-09-20 ENCOUNTER — Ambulatory Visit: Admitting: Family Medicine

## 2024-09-20 VITALS — BP 98/78 | HR 90 | Temp 97.9°F | Ht 72.0 in | Wt 201.4 lb

## 2024-09-20 DIAGNOSIS — B0229 Other postherpetic nervous system involvement: Secondary | ICD-10-CM

## 2024-09-20 MED ORDER — GABAPENTIN 300 MG PO CAPS: 600.0000 mg | ORAL_CAPSULE | Freq: Two times a day (BID) | ORAL | 0 refills | Status: DC

## 2024-09-20 NOTE — Progress Notes (Unsigned)
 Established Patient Office Visit  Subjective   Patient ID: Bruce Robertson, male    DOB: 1961-08-18  Age: 63 y.o. MRN: [id number]  Chief Complaint  Patient presents with   Herpes Zoster    Patient complains of severe left lateral foot pain since diagnosis of shingles, difficulty walking, unable to move toes and feels as if he walking on rocks    HPI Discussed the use of AI scribe software for clinical note transcription with the patient, who gave verbal consent to proceed.  History of Present Illness   Bruce Robertson is a 63 year old male with postherpetic neuralgia who presents with persistent nerve pain.  He was diagnosed with shingles a few weeks ago and treated with valacyclovir  and gabapentin . Most lesions have resolved, but he continues to experience significant nerve pain, described as 'walking on pins and needles' and numbness in the affected area. He has difficulty moving his little toe and experiences pain that worsens with touch.  He completed gabapentin  but is unsure of its effectiveness. The pain persists and worsens as the lesions cleared. Lidocaine  cream has been ineffective, and hot showers exacerbate the pain.  He works for the city, requiring extensive walking. He is on immunosuppressive medications, including Prograf , prednisone , and Myfortic, which may contribute to prolonged nerve pain recovery.       Current Outpatient Medications  Medication Instructions   ACCU-CHEK GUIDE test strip Use as directed-patient needs an appt   Accu-Chek Softclix Lancets lancets Use as directed   amLODipine  (NORVASC ) 5 mg, Oral, Daily   blood glucose meter kit and supplies KIT Dispense based on patient and insurance preference. Use up to four times daily as directed.   BREZTRI  AEROSPHERE 160-9-4.8 MCG/ACT AERO inhaler 2 puffs, Inhalation, 2 times daily   Continuous Glucose Sensor (FREESTYLE LIBRE 3 PLUS SENSOR) MISC 1 Application, Transdermal, Every 14 days   cyclobenzaprine   (FLEXERIL ) 10 mg, Oral, 2 times daily PRN   gabapentin  (NEURONTIN ) 600 mg, Oral, 2 times daily   ibuprofen  (ADVIL ) 600 mg, Oral, Every 6 hours PRN   insulin  lispro (HUMALOG) 100 UNIT/ML KwikPen Inject into the skin.   insulin  lispro (HUMALOG) 2 Units, As needed   loperamide  (IMODIUM ) 2 mg, Oral, 4 times daily PRN   mycophenolate (MYFORTIC) 720 mg, 2 times daily   ondansetron  (ZOFRAN -ODT) 4 mg, Oral, Every 8 hours PRN   pantoprazole  (PROTONIX ) 40 mg, Oral, Daily   pravastatin  (PRAVACHOL ) 40 mg, Oral, Daily at bedtime   predniSONE  (DELTASONE ) 5 mg, Daily with breakfast   RELION PEN NEEDLES 31G X 6 MM MISC    rosuvastatin (CRESTOR) 10 mg, Daily   sucralfate  (CARAFATE ) 1 g, Oral, 3 times daily   tacrolimus  (PROGRAF ) 1 MG capsule Take by mouth. 9 tablets twice a day    Patient Active Problem List   Diagnosis Date Noted   Heart transplant status (HCC) 09/02/2024   Pituitary adenoma (HCC) 09/02/2024   Chronic obstructive pulmonary disease (HCC) 12/10/2023   Other male erectile dysfunction 12/10/2023   Chronic insomnia 10/16/2022   Type 2 diabetes mellitus with complication, without long-term current use of insulin  (HCC) 04/04/2022   Pituitary abnormality 07/04/2021   Dizziness 12/31/2020   Patellar tendon rupture, right, initial encounter 09/13/2020   Restrictive lung disease 06/22/2020   BRBPR (bright red blood per rectum) 05/24/2020   Secondary hypercoagulable state 03/28/2020   Educated about COVID-19 virus infection 02/10/2020   S/P ICD (internal cardiac defibrillator) procedure 07/18/2019   Nontraumatic complete tear of right  rotator cuff 06/29/2019   Pain in left elbow 06/29/2019   Thrombocytopenia 06/10/2018   AICD (automatic cardioverter/defibrillator) present 06/10/2018   History of peptic ulcer disease 06/10/2018   Difficulty controlling anger 12/31/2016   Anxiety 08/20/2016   Polyp of colon 04/29/2016   Shortness of breath 03/05/2016   Essential hypertension 10/31/2015    Hx of CABG 08/29/2015   Stage 3 chronic kidney disease (HCC) 08/29/2015   OSA treated with BiPAP 06/10/2011   Obesity (BMI 30-39.9) 05/02/2010   Hemorrhoids 02/08/2010   GERD (gastroesophageal reflux disease) 02/08/2010   Rectal bleeding 02/08/2010   Hyperlipidemia 04/06/2009     Review of Systems  All other systems reviewed and are negative.     Objective:     BP 98/78   Pulse 90   Temp 97.9 F (36.6 C) (Oral)   Ht 6' (1.829 m)   Wt 201 lb 6.4 oz (91.4 kg)   SpO2 98%   BMI 27.31 kg/m    Physical Exam Vitals reviewed.  Constitutional:      Appearance: Normal appearance. He is normal weight.  Neurological:     Mental Status: He is alert.   The left leg rash is crusted over and healing.   No results found for any visits on 09/20/24.    The 10-year ASCVD risk score (Arnett DK, et al., 2019) is: 13.8%    Assessment & Plan:  Postherpetic neuralgia -     Gabapentin ; Take 2 capsules (600 mg total) by mouth 2 (two) times daily.  Dispense: 120 capsule; Refill: 0   Assessment and Plan    Postherpetic neuralgia following shingles Persistent nerve pain following shingles infection, characterized by stabbing pain, numbness, and pins-and-needles sensation, particularly affecting the lower extremities. Pain exacerbated by walking and touch. Previous treatment with gabapentin  was completed, but symptoms persist. Lidocaine  cream was ineffective. Pain may be prolonged due to immunosuppressive medications (Prograf , prednisone , Myfortic). - Increased gabapentin  dosage to 600 mg twice daily, with flexibility to adjust dosing schedule as needed. - Continue use of lidocaine  cream for localized pain relief. - Allow use of acetaminophen  and ibuprofen  for additional pain management. - Recommended light duty work for two weeks to reduce foot strain. - Provided a larger supply of gabapentin  for a month's duration.        No follow-ups on file.    Bruce CHRISTELLA Sharper, MD

## 2024-10-17 ENCOUNTER — Other Ambulatory Visit: Payer: Self-pay | Admitting: Family Medicine

## 2024-10-17 DIAGNOSIS — B0229 Other postherpetic nervous system involvement: Secondary | ICD-10-CM

## 2024-10-19 ENCOUNTER — Ambulatory Visit: Payer: Self-pay

## 2024-10-19 NOTE — Telephone Encounter (Signed)
 FYI Dr Heron pt

## 2024-10-19 NOTE — Telephone Encounter (Signed)
°  Reason for Disposition  [1] MODERATE pain (e.g., interferes with normal activities, limping) AND [2] present > 3 days  Answer Assessment - Initial Assessment Questions Pt states he initially was calling to get a refill or a new medication. He states he has been taking 600mg  of gabapentin  3 times a day and it is not helping. He is still having significant pain in his left foot from the shingles. He states pain is constant. When he is walking it feel like needles, when he lays down it feels like his heartbeat is in his ankle, and if he touches the outside he can't feel it. Pain is inside, sharp shooting. 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 Pt is requesting something other than the gabapentin  as he feels it has not helped.      1. ONSET: When did the pain start?      About a month ago 2. LOCATION: Where is the pain located?      Left foot  3. PAIN: How bad is the pain?    (Scale 1-10; or mild, moderate, severe)     Can get up to severe 4. WORK OR EXERCISE: Has there been any recent work or exercise that involved this part of the body?      no 5. CAUSE: What do you think is causing the leg pain?     shingles 6. OTHER SYMPTOMS: Do you have any other symptoms? (e.g., chest pain, back pain, breathing difficulty, swelling, rash, fever, numbness, weakness) Numbness as been ongoing as well  Protocols used: Leg Pain-A-AH

## 2024-10-19 NOTE — Telephone Encounter (Signed)
 FYI Only or Action Required?: Action required by provider: pt requesting a different medication. .  Patient was last seen in primary care on 09/20/2024 by [name], MD.  Called Nurse Triage reporting Pain.  Symptoms began about a month ago.  Interventions attempted: Prescription medications: gabapentin .  Symptoms are: unchanged.  Triage Disposition: See PCP When Office is Open (Within 3 Days)  Patient/caregiver understands and will follow disposition?: No, wishes to speak with PCP

## 2024-10-19 NOTE — Telephone Encounter (Signed)
 Message from Tappan S sent at 10/19/2024  4:29 PM EST  Reason for Triage: pt called because his foot is getting bad and he has took all of the gabapentin  (NEURONTIN ) 300 MG capsule. He would like a cb soon as possible. [phone]     Phone call placed to patient-no answer. Unable to leave a voicemail. Was able to send an SMS message to his phone.

## 2024-10-20 NOTE — Telephone Encounter (Signed)
 Patient informed of the message below and an appt was scheduled on 12/18.

## 2024-10-20 NOTE — Telephone Encounter (Signed)
 He needs another appointment so I can look at his leg-- I'm not sure why is he still having pain since the shingles should be resolved by now.SABRA

## 2024-10-21 ENCOUNTER — Ambulatory Visit: Admitting: Family Medicine

## 2024-10-21 ENCOUNTER — Encounter: Payer: Self-pay | Admitting: Family Medicine

## 2024-10-21 VITALS — BP 118/82 | HR 90 | Temp 98.0°F | Ht 72.0 in | Wt 212.5 lb

## 2024-10-21 DIAGNOSIS — B0229 Other postherpetic nervous system involvement: Secondary | ICD-10-CM | POA: Diagnosis not present

## 2024-10-21 MED ORDER — METHYLPREDNISOLONE 4 MG PO TBPK: ORAL_TABLET | ORAL | 0 refills | Status: AC

## 2024-10-21 MED ORDER — AMITRIPTYLINE HCL 25 MG PO TABS: 25.0000 mg | ORAL_TABLET | Freq: Every evening | ORAL | 0 refills | Status: DC | PRN

## 2024-10-21 NOTE — Progress Notes (Unsigned)
 Established Patient Office Visit  Subjective   Patient ID: Bruce Robertson, male    DOB: [birth year]  Age: 63 y.o. MRN: [id number]  Chief Complaint  Patient presents with   Pain    Patient complains of recurrent left foot pain from shingles, worse past 2 months    HPI   Current Outpatient Medications  Medication Instructions   ACCU-CHEK GUIDE test strip Use as directed-patient needs an appt   Accu-Chek Softclix Lancets lancets Use as directed   amLODipine  (NORVASC ) 5 mg, Oral, Daily   blood glucose meter kit and supplies KIT Dispense based on patient and insurance preference. Use up to four times daily as directed.   BREZTRI  AEROSPHERE 160-9-4.8 MCG/ACT AERO inhaler 2 puffs, Inhalation, 2 times daily   Continuous Glucose Sensor (FREESTYLE LIBRE 3 PLUS SENSOR) MISC 1 Application, Transdermal, Every 14 days   cyclobenzaprine  (FLEXERIL ) 10 mg, Oral, 2 times daily PRN   gabapentin  (NEURONTIN ) 600 mg, Oral, 2 times daily   ibuprofen  (ADVIL ) 600 mg, Oral, Every 6 hours PRN   insulin  lispro (HUMALOG) 100 UNIT/ML KwikPen Inject into the skin.   insulin  lispro (HUMALOG) 2 Units, As needed   loperamide  (IMODIUM ) 2 mg, Oral, 4 times daily PRN   mycophenolate (MYFORTIC) 720 mg, 2 times daily   ondansetron  (ZOFRAN -ODT) 4 mg, Oral, Every 8 hours PRN   pantoprazole  (PROTONIX ) 40 mg, Oral, Daily   pravastatin  (PRAVACHOL ) 40 mg, Oral, Daily at bedtime   predniSONE  (DELTASONE ) 5 mg, Daily with breakfast   RELION PEN NEEDLES 31G X 6 MM MISC    rosuvastatin (CRESTOR) 10 mg, Daily   sucralfate  (CARAFATE ) 1 g, Oral, 3 times daily   tacrolimus  (PROGRAF ) 1 MG capsule Take by mouth. 9 tablets twice a day    Patient Active Problem List   Diagnosis Date Noted   Heart transplant status (HCC) 09/02/2024   Pituitary adenoma (HCC) 09/02/2024   Chronic obstructive pulmonary disease (HCC) 12/10/2023   Other male erectile dysfunction 12/10/2023   Chronic insomnia 10/16/2022   Type 2 diabetes mellitus  with complication, without long-term current use of insulin  (HCC) 04/04/2022   Pituitary abnormality 07/04/2021   Dizziness 12/31/2020   Patellar tendon rupture, right, initial encounter 09/13/2020   Restrictive lung disease 06/22/2020   BRBPR (bright red blood per rectum) 05/24/2020   Secondary hypercoagulable state 03/28/2020   Educated about COVID-19 virus infection 02/10/2020   S/P ICD (internal cardiac defibrillator) procedure 07/18/2019   Nontraumatic complete tear of right rotator cuff 06/29/2019   Pain in left elbow 06/29/2019   Thrombocytopenia 06/10/2018   AICD (automatic cardioverter/defibrillator) present 06/10/2018   History of peptic ulcer disease 06/10/2018   Difficulty controlling anger 12/31/2016   Anxiety 08/20/2016   Polyp of colon 04/29/2016   Shortness of breath 03/05/2016   Essential hypertension 10/31/2015   Hx of CABG 08/29/2015   Stage 3 chronic kidney disease (HCC) 08/29/2015   OSA treated with BiPAP 06/10/2011   Obesity (BMI 30-39.9) 05/02/2010   Hemorrhoids 02/08/2010   GERD (gastroesophageal reflux disease) 02/08/2010   Rectal bleeding 02/08/2010   Hyperlipidemia 04/06/2009     ROS    Objective:     BP 118/82   Pulse 90   Temp 98 F (36.7 C) (Oral)   Ht 6' (1.829 m)   Wt 212 lb 8 oz (96.4 kg)   SpO2 97%   BMI 28.82 kg/m  {Vitals History (Optional):23777}  Physical Exam   No results found for any visits on 10/21/24.  {Labs (  Optional):23779}  The 10-year ASCVD risk score (Arnett DK, et al., 2019) is: 19.1%    Assessment & Plan:  There are no diagnoses linked to this encounter.   No follow-ups on file.    Heron CHRISTELLA Sharper, MD

## 2024-11-13 ENCOUNTER — Other Ambulatory Visit: Payer: Self-pay | Admitting: Family Medicine

## 2024-11-13 DIAGNOSIS — B0229 Other postherpetic nervous system involvement: Secondary | ICD-10-CM

## 2024-11-16 ENCOUNTER — Other Ambulatory Visit: Payer: Self-pay | Admitting: Family Medicine

## 2024-11-16 DIAGNOSIS — B0229 Other postherpetic nervous system involvement: Secondary | ICD-10-CM

## 2024-12-02 ENCOUNTER — Other Ambulatory Visit: Payer: Self-pay | Admitting: Family Medicine

## 2024-12-02 DIAGNOSIS — E782 Mixed hyperlipidemia: Secondary | ICD-10-CM

## 2025-05-20 ENCOUNTER — Ambulatory Visit
